# Patient Record
Sex: Female | Born: 1940 | Race: White | Hispanic: No | Marital: Married | Smoking: Never smoker
Health system: Southern US, Community
[De-identification: ages and names within clinical notes are randomized; demographics above are authoritative.]

## PROBLEM LIST (undated history)

## (undated) DIAGNOSIS — M069 Rheumatoid arthritis, unspecified: Secondary | ICD-10-CM

## (undated) DIAGNOSIS — I252 Old myocardial infarction: Secondary | ICD-10-CM

## (undated) DIAGNOSIS — M51379 Other intervertebral disc degeneration, lumbosacral region without mention of lumbar back pain or lower extremity pain: Secondary | ICD-10-CM

## (undated) DIAGNOSIS — IMO0001 Reserved for inherently not codable concepts without codable children: Secondary | ICD-10-CM

## (undated) DIAGNOSIS — I639 Cerebral infarction, unspecified: Secondary | ICD-10-CM

## (undated) DIAGNOSIS — R05 Cough: Secondary | ICD-10-CM

## (undated) DIAGNOSIS — K589 Irritable bowel syndrome without diarrhea: Secondary | ICD-10-CM

## (undated) DIAGNOSIS — M1990 Unspecified osteoarthritis, unspecified site: Secondary | ICD-10-CM

## (undated) DIAGNOSIS — I10 Essential (primary) hypertension: Secondary | ICD-10-CM

## (undated) DIAGNOSIS — R42 Dizziness and giddiness: Secondary | ICD-10-CM

## (undated) DIAGNOSIS — M5137 Other intervertebral disc degeneration, lumbosacral region: Secondary | ICD-10-CM

## (undated) DIAGNOSIS — G4733 Obstructive sleep apnea (adult) (pediatric): Secondary | ICD-10-CM

## (undated) DIAGNOSIS — Z8619 Personal history of other infectious and parasitic diseases: Secondary | ICD-10-CM

## (undated) DIAGNOSIS — M797 Fibromyalgia: Secondary | ICD-10-CM

## (undated) DIAGNOSIS — L719 Rosacea, unspecified: Secondary | ICD-10-CM

## (undated) DIAGNOSIS — I2510 Atherosclerotic heart disease of native coronary artery without angina pectoris: Secondary | ICD-10-CM

## (undated) DIAGNOSIS — T4145XA Adverse effect of unspecified anesthetic, initial encounter: Secondary | ICD-10-CM

## (undated) DIAGNOSIS — N390 Urinary tract infection, site not specified: Secondary | ICD-10-CM

## (undated) DIAGNOSIS — L4050 Arthropathic psoriasis, unspecified: Secondary | ICD-10-CM

## (undated) DIAGNOSIS — M2540 Effusion, unspecified joint: Secondary | ICD-10-CM

## (undated) DIAGNOSIS — R059 Cough, unspecified: Secondary | ICD-10-CM

## (undated) DIAGNOSIS — I608 Other nontraumatic subarachnoid hemorrhage: Secondary | ICD-10-CM

## (undated) DIAGNOSIS — T7840XA Allergy, unspecified, initial encounter: Secondary | ICD-10-CM

## (undated) DIAGNOSIS — M2550 Pain in unspecified joint: Secondary | ICD-10-CM

## (undated) DIAGNOSIS — T8859XA Other complications of anesthesia, initial encounter: Secondary | ICD-10-CM

## (undated) DIAGNOSIS — I25111 Atherosclerotic heart disease of native coronary artery with angina pectoris with documented spasm: Secondary | ICD-10-CM

## (undated) DIAGNOSIS — J302 Other seasonal allergic rhinitis: Secondary | ICD-10-CM

## (undated) DIAGNOSIS — I82409 Acute embolism and thrombosis of unspecified deep veins of unspecified lower extremity: Secondary | ICD-10-CM

## (undated) HISTORY — DX: Rosacea, unspecified: L71.9

## (undated) HISTORY — DX: Acute embolism and thrombosis of unspecified deep veins of unspecified lower extremity: I82.409

## (undated) HISTORY — DX: Unspecified osteoarthritis, unspecified site: M19.90

## (undated) HISTORY — DX: Cerebral infarction, unspecified: I63.9

## (undated) HISTORY — DX: Old myocardial infarction: I25.2

## (undated) HISTORY — DX: Reserved for inherently not codable concepts without codable children: IMO0001

## (undated) HISTORY — DX: Allergy, unspecified, initial encounter: T78.40XA

## (undated) HISTORY — DX: Other intervertebral disc degeneration, lumbosacral region without mention of lumbar back pain or lower extremity pain: M51.379

## (undated) HISTORY — DX: Arthropathic psoriasis, unspecified: L40.50

## (undated) HISTORY — PX: CATARACT EXTRACTION: SUR2

## (undated) HISTORY — DX: Other intervertebral disc degeneration, lumbosacral region: M51.37

## (undated) HISTORY — DX: Atherosclerotic heart disease of native coronary artery without angina pectoris: I25.10

## (undated) HISTORY — DX: Obstructive sleep apnea (adult) (pediatric): G47.33

## (undated) HISTORY — DX: Essential (primary) hypertension: I10

## (undated) HISTORY — PX: COLONOSCOPY: SHX174

## (undated) HISTORY — DX: Other nontraumatic subarachnoid hemorrhage: I60.8

## (undated) HISTORY — DX: Atherosclerotic heart disease of native coronary artery with angina pectoris with documented spasm: I25.111

## (undated) HISTORY — PX: OOPHORECTOMY: SHX86

---

## 1990-08-14 HISTORY — PX: VAGINAL HYSTERECTOMY: SUR661

## 1994-08-14 DIAGNOSIS — I252 Old myocardial infarction: Secondary | ICD-10-CM

## 1994-08-14 HISTORY — PX: ATHERECTOMY: SHX47

## 1994-08-14 HISTORY — DX: Old myocardial infarction: I25.2

## 1994-08-14 HISTORY — PX: CARDIAC CATHETERIZATION: SHX172

## 1996-08-14 HISTORY — PX: ETHMOIDECTOMY: SHX5197

## 1996-08-14 HISTORY — PX: NASAL SINUS SURGERY: SHX719

## 1996-08-14 HISTORY — PX: OTHER SURGICAL HISTORY: SHX169

## 1999-09-23 ENCOUNTER — Other Ambulatory Visit: Admission: RE | Admit: 1999-09-23 | Discharge: 1999-09-23 | Payer: Self-pay | Admitting: Obstetrics and Gynecology

## 2000-04-05 ENCOUNTER — Encounter: Payer: Self-pay | Admitting: Gastroenterology

## 2000-04-05 ENCOUNTER — Encounter: Admission: RE | Admit: 2000-04-05 | Discharge: 2000-04-05 | Payer: Self-pay | Admitting: Gastroenterology

## 2000-05-08 ENCOUNTER — Encounter: Admission: RE | Admit: 2000-05-08 | Discharge: 2000-05-08 | Payer: Self-pay | Admitting: Internal Medicine

## 2000-05-11 ENCOUNTER — Encounter: Payer: Self-pay | Admitting: Otolaryngology

## 2000-05-11 ENCOUNTER — Encounter: Admission: RE | Admit: 2000-05-11 | Discharge: 2000-05-11 | Payer: Self-pay | Admitting: Otolaryngology

## 2000-05-14 ENCOUNTER — Encounter: Admission: RE | Admit: 2000-05-14 | Discharge: 2000-05-14 | Payer: Self-pay | Admitting: Internal Medicine

## 2000-05-14 ENCOUNTER — Encounter: Payer: Self-pay | Admitting: Internal Medicine

## 2000-05-17 ENCOUNTER — Encounter: Payer: Self-pay | Admitting: Otolaryngology

## 2000-05-17 ENCOUNTER — Encounter: Admission: RE | Admit: 2000-05-17 | Discharge: 2000-05-17 | Payer: Self-pay | Admitting: Otolaryngology

## 2000-05-23 ENCOUNTER — Encounter (INDEPENDENT_AMBULATORY_CARE_PROVIDER_SITE_OTHER): Payer: Self-pay | Admitting: *Deleted

## 2000-05-23 ENCOUNTER — Ambulatory Visit (HOSPITAL_BASED_OUTPATIENT_CLINIC_OR_DEPARTMENT_OTHER): Admission: RE | Admit: 2000-05-23 | Discharge: 2000-05-23 | Payer: Self-pay | Admitting: Otolaryngology

## 2000-09-27 ENCOUNTER — Other Ambulatory Visit: Admission: RE | Admit: 2000-09-27 | Discharge: 2000-09-27 | Payer: Self-pay | Admitting: Obstetrics and Gynecology

## 2001-04-11 ENCOUNTER — Encounter: Payer: Self-pay | Admitting: Obstetrics and Gynecology

## 2001-04-11 ENCOUNTER — Encounter: Admission: RE | Admit: 2001-04-11 | Discharge: 2001-04-11 | Payer: Self-pay | Admitting: Obstetrics and Gynecology

## 2001-10-15 ENCOUNTER — Other Ambulatory Visit: Admission: RE | Admit: 2001-10-15 | Discharge: 2001-10-15 | Payer: Self-pay | Admitting: Obstetrics and Gynecology

## 2002-04-15 ENCOUNTER — Encounter: Admission: RE | Admit: 2002-04-15 | Discharge: 2002-04-15 | Payer: Self-pay | Admitting: Obstetrics and Gynecology

## 2002-04-15 ENCOUNTER — Encounter: Payer: Self-pay | Admitting: Obstetrics and Gynecology

## 2002-10-31 ENCOUNTER — Other Ambulatory Visit: Admission: RE | Admit: 2002-10-31 | Discharge: 2002-10-31 | Payer: Self-pay | Admitting: Obstetrics and Gynecology

## 2002-11-04 ENCOUNTER — Encounter: Admission: RE | Admit: 2002-11-04 | Discharge: 2002-11-04 | Payer: Self-pay | Admitting: Obstetrics and Gynecology

## 2002-11-04 ENCOUNTER — Encounter: Payer: Self-pay | Admitting: Obstetrics and Gynecology

## 2003-11-02 ENCOUNTER — Other Ambulatory Visit: Admission: RE | Admit: 2003-11-02 | Discharge: 2003-11-02 | Payer: Self-pay | Admitting: Obstetrics and Gynecology

## 2003-11-18 ENCOUNTER — Encounter: Admission: RE | Admit: 2003-11-18 | Discharge: 2003-11-18 | Payer: Self-pay | Admitting: Obstetrics and Gynecology

## 2004-03-23 ENCOUNTER — Encounter: Admission: RE | Admit: 2004-03-23 | Discharge: 2004-03-23 | Payer: Self-pay | Admitting: Gastroenterology

## 2004-03-28 ENCOUNTER — Encounter: Admission: RE | Admit: 2004-03-28 | Discharge: 2004-03-28 | Payer: Self-pay | Admitting: Gastroenterology

## 2004-11-02 ENCOUNTER — Other Ambulatory Visit: Admission: RE | Admit: 2004-11-02 | Discharge: 2004-11-02 | Payer: Self-pay | Admitting: Obstetrics and Gynecology

## 2004-11-25 ENCOUNTER — Encounter: Admission: RE | Admit: 2004-11-25 | Discharge: 2004-11-25 | Payer: Self-pay | Admitting: Obstetrics and Gynecology

## 2005-05-24 ENCOUNTER — Encounter: Admission: RE | Admit: 2005-05-24 | Discharge: 2005-05-24 | Payer: Self-pay | Admitting: Allergy and Immunology

## 2005-06-14 ENCOUNTER — Encounter: Admission: RE | Admit: 2005-06-14 | Discharge: 2005-06-14 | Payer: Self-pay | Admitting: Gastroenterology

## 2005-11-16 ENCOUNTER — Observation Stay (HOSPITAL_COMMUNITY): Admission: EM | Admit: 2005-11-16 | Discharge: 2005-11-16 | Payer: Self-pay | Admitting: *Deleted

## 2005-11-27 ENCOUNTER — Encounter: Admission: RE | Admit: 2005-11-27 | Discharge: 2005-11-27 | Payer: Self-pay | Admitting: Obstetrics and Gynecology

## 2006-02-16 ENCOUNTER — Encounter: Admission: RE | Admit: 2006-02-16 | Discharge: 2006-02-16 | Payer: Self-pay | Admitting: Obstetrics and Gynecology

## 2006-02-27 ENCOUNTER — Other Ambulatory Visit: Admission: RE | Admit: 2006-02-27 | Discharge: 2006-02-27 | Payer: Self-pay | Admitting: Obstetrics and Gynecology

## 2006-05-14 ENCOUNTER — Encounter: Admission: RE | Admit: 2006-05-14 | Discharge: 2006-05-14 | Payer: Self-pay | Admitting: Gastroenterology

## 2006-05-14 ENCOUNTER — Encounter: Payer: Self-pay | Admitting: Family Medicine

## 2006-11-29 ENCOUNTER — Encounter: Admission: RE | Admit: 2006-11-29 | Discharge: 2006-11-29 | Payer: Self-pay | Admitting: Obstetrics and Gynecology

## 2007-04-25 ENCOUNTER — Emergency Department (HOSPITAL_COMMUNITY): Admission: EM | Admit: 2007-04-25 | Discharge: 2007-04-25 | Payer: Self-pay | Admitting: Emergency Medicine

## 2007-05-20 ENCOUNTER — Ambulatory Visit: Payer: Self-pay | Admitting: Family Medicine

## 2007-05-25 ENCOUNTER — Encounter: Payer: Self-pay | Admitting: Family Medicine

## 2007-05-29 ENCOUNTER — Ambulatory Visit: Payer: Self-pay | Admitting: Family Medicine

## 2007-05-29 DIAGNOSIS — L719 Rosacea, unspecified: Secondary | ICD-10-CM | POA: Insufficient documentation

## 2007-05-29 DIAGNOSIS — E785 Hyperlipidemia, unspecified: Secondary | ICD-10-CM

## 2007-05-29 DIAGNOSIS — M1990 Unspecified osteoarthritis, unspecified site: Secondary | ICD-10-CM | POA: Insufficient documentation

## 2007-05-29 DIAGNOSIS — I10 Essential (primary) hypertension: Secondary | ICD-10-CM | POA: Insufficient documentation

## 2007-05-29 DIAGNOSIS — J309 Allergic rhinitis, unspecified: Secondary | ICD-10-CM | POA: Insufficient documentation

## 2007-05-29 DIAGNOSIS — G4733 Obstructive sleep apnea (adult) (pediatric): Secondary | ICD-10-CM

## 2007-06-03 ENCOUNTER — Encounter: Payer: Self-pay | Admitting: Family Medicine

## 2007-06-06 ENCOUNTER — Encounter: Payer: Self-pay | Admitting: Family Medicine

## 2007-06-11 ENCOUNTER — Encounter: Payer: Self-pay | Admitting: Family Medicine

## 2007-07-02 ENCOUNTER — Encounter: Payer: Self-pay | Admitting: Family Medicine

## 2007-08-05 ENCOUNTER — Ambulatory Visit: Payer: Self-pay | Admitting: Family Medicine

## 2007-08-14 ENCOUNTER — Telehealth: Payer: Self-pay | Admitting: Internal Medicine

## 2007-08-14 ENCOUNTER — Telehealth (INDEPENDENT_AMBULATORY_CARE_PROVIDER_SITE_OTHER): Payer: Self-pay | Admitting: *Deleted

## 2008-02-12 LAB — CONVERTED CEMR LAB: Pap Smear: NORMAL

## 2008-02-20 ENCOUNTER — Encounter: Payer: Self-pay | Admitting: Family Medicine

## 2008-02-20 ENCOUNTER — Encounter: Admission: RE | Admit: 2008-02-20 | Discharge: 2008-02-20 | Payer: Self-pay | Admitting: Obstetrics and Gynecology

## 2008-03-03 ENCOUNTER — Other Ambulatory Visit: Admission: RE | Admit: 2008-03-03 | Discharge: 2008-03-03 | Payer: Self-pay | Admitting: Obstetrics and Gynecology

## 2008-05-20 ENCOUNTER — Ambulatory Visit: Payer: Self-pay | Admitting: Family Medicine

## 2008-05-20 DIAGNOSIS — M797 Fibromyalgia: Secondary | ICD-10-CM

## 2008-05-21 ENCOUNTER — Ambulatory Visit: Payer: Self-pay | Admitting: Family Medicine

## 2008-05-22 LAB — CONVERTED CEMR LAB
Albumin: 4 g/dL (ref 3.5–5.2)
Alkaline Phosphatase: 52 units/L (ref 39–117)
Anti Nuclear Antibody(ANA): NEGATIVE
Basophils Absolute: 0.1 10*3/uL (ref 0.0–0.1)
Basophils Relative: 1.1 % (ref 0.0–3.0)
Bilirubin, Direct: 0.1 mg/dL (ref 0.0–0.3)
CRP, High Sensitivity: <1 — ABNORMAL LOW (ref 0.00–5.00)
Calcium: 8.8 mg/dL (ref 8.4–10.5)
Chloride: 103 meq/L (ref 96–112)
Creatinine, Ser: 0.9 mg/dL (ref 0.4–1.2)
Cyclic Citrullin Peptide Ab: 0.4 units (ref <7)
Eosinophils Absolute: 0.1 10*3/uL (ref 0.0–0.7)
Eosinophils Relative: 2.8 % (ref 0.0–5.0)
GFR calc Af Amer: 80 mL/min
HCT: 40.6 % (ref 36.0–46.0)
Hemoglobin: 13.6 g/dL (ref 12.0–15.0)
Lymphocytes Relative: 21.5 % (ref 12.0–46.0)
Monocytes Absolute: 0.5 10*3/uL (ref 0.1–1.0)
Neutro Abs: 3.1 10*3/uL (ref 1.4–7.7)
Neutrophils Relative %: 65.1 % (ref 43.0–77.0)
Platelets: 231 10*3/uL (ref 150–400)
RBC: 4.13 M/uL (ref 3.87–5.11)
Vitamin B-12: 850 pg/mL (ref 211–911)

## 2008-05-28 ENCOUNTER — Encounter: Admission: RE | Admit: 2008-05-28 | Discharge: 2008-05-28 | Payer: Self-pay | Admitting: Family Medicine

## 2008-07-22 ENCOUNTER — Encounter: Payer: Self-pay | Admitting: Family Medicine

## 2008-08-20 ENCOUNTER — Encounter: Payer: Self-pay | Admitting: Family Medicine

## 2008-08-25 ENCOUNTER — Ambulatory Visit: Payer: Self-pay | Admitting: Internal Medicine

## 2008-08-31 ENCOUNTER — Encounter: Payer: Self-pay | Admitting: Family Medicine

## 2008-09-10 ENCOUNTER — Encounter: Payer: Self-pay | Admitting: Family Medicine

## 2008-12-29 ENCOUNTER — Encounter: Payer: Self-pay | Admitting: Family Medicine

## 2008-12-29 ENCOUNTER — Ambulatory Visit: Payer: Self-pay | Admitting: Cardiology

## 2008-12-29 ENCOUNTER — Ambulatory Visit: Payer: Self-pay | Admitting: Ophthalmology

## 2009-01-13 ENCOUNTER — Encounter: Payer: Self-pay | Admitting: Family Medicine

## 2009-01-15 ENCOUNTER — Encounter: Admission: RE | Admit: 2009-01-15 | Discharge: 2009-01-15 | Payer: Self-pay | Admitting: Family Medicine

## 2009-01-15 ENCOUNTER — Ambulatory Visit: Payer: Self-pay | Admitting: Family Medicine

## 2009-01-15 DIAGNOSIS — M51379 Other intervertebral disc degeneration, lumbosacral region without mention of lumbar back pain or lower extremity pain: Secondary | ICD-10-CM | POA: Insufficient documentation

## 2009-01-15 DIAGNOSIS — M5137 Other intervertebral disc degeneration, lumbosacral region: Secondary | ICD-10-CM | POA: Insufficient documentation

## 2009-01-18 ENCOUNTER — Telehealth (INDEPENDENT_AMBULATORY_CARE_PROVIDER_SITE_OTHER): Payer: Self-pay | Admitting: Internal Medicine

## 2009-01-25 ENCOUNTER — Telehealth: Payer: Self-pay | Admitting: Family Medicine

## 2009-01-25 ENCOUNTER — Emergency Department (HOSPITAL_COMMUNITY): Admission: EM | Admit: 2009-01-25 | Discharge: 2009-01-25 | Payer: Self-pay | Admitting: Emergency Medicine

## 2009-01-26 ENCOUNTER — Ambulatory Visit (HOSPITAL_COMMUNITY): Admission: RE | Admit: 2009-01-26 | Discharge: 2009-01-26 | Payer: Self-pay | Admitting: Emergency Medicine

## 2009-01-26 ENCOUNTER — Encounter (INDEPENDENT_AMBULATORY_CARE_PROVIDER_SITE_OTHER): Payer: Self-pay | Admitting: Internal Medicine

## 2009-01-27 ENCOUNTER — Telehealth: Payer: Self-pay | Admitting: Family Medicine

## 2009-02-02 ENCOUNTER — Encounter: Payer: Self-pay | Admitting: Family Medicine

## 2009-02-04 ENCOUNTER — Ambulatory Visit: Payer: Self-pay | Admitting: Family Medicine

## 2009-02-11 ENCOUNTER — Encounter: Payer: Self-pay | Admitting: Family Medicine

## 2009-02-16 ENCOUNTER — Encounter (INDEPENDENT_AMBULATORY_CARE_PROVIDER_SITE_OTHER): Payer: Self-pay | Admitting: Internal Medicine

## 2009-02-16 ENCOUNTER — Telehealth (INDEPENDENT_AMBULATORY_CARE_PROVIDER_SITE_OTHER): Payer: Self-pay | Admitting: Internal Medicine

## 2009-02-25 ENCOUNTER — Encounter: Payer: Self-pay | Admitting: Family Medicine

## 2009-02-25 ENCOUNTER — Encounter: Admission: RE | Admit: 2009-02-25 | Discharge: 2009-02-25 | Payer: Self-pay | Admitting: Obstetrics and Gynecology

## 2009-02-26 ENCOUNTER — Encounter (INDEPENDENT_AMBULATORY_CARE_PROVIDER_SITE_OTHER): Payer: Self-pay | Admitting: *Deleted

## 2009-03-08 ENCOUNTER — Ambulatory Visit: Payer: Self-pay | Admitting: Obstetrics and Gynecology

## 2009-03-11 ENCOUNTER — Encounter: Payer: Self-pay | Admitting: Family Medicine

## 2009-03-18 ENCOUNTER — Ambulatory Visit: Payer: Self-pay | Admitting: Internal Medicine

## 2009-03-22 ENCOUNTER — Encounter: Payer: Self-pay | Admitting: Family Medicine

## 2009-03-22 LAB — CBC WITH DIFFERENTIAL/PLATELET
Basophils Absolute: 0.1 10*3/uL (ref 0.0–0.1)
HCT: 36.1 % (ref 34.8–46.6)
HGB: 12.1 g/dL (ref 11.6–15.9)
MONO#: 0.6 10*3/uL (ref 0.1–0.9)
NEUT%: 68.4 % (ref 38.4–76.8)
WBC: 7.5 10*3/uL (ref 3.9–10.3)
lymph#: 1.6 10*3/uL (ref 0.9–3.3)

## 2009-03-24 ENCOUNTER — Ambulatory Visit: Payer: Self-pay | Admitting: Obstetrics and Gynecology

## 2009-03-25 LAB — IGG, IGA, IGM
IgA: 308 mg/dL (ref 68–378)
IgM, Serum: 70 mg/dL (ref 60–263)

## 2009-03-25 LAB — COMPREHENSIVE METABOLIC PANEL
BUN: 19 mg/dL (ref 6–23)
CO2: 27 mEq/L (ref 19–32)
Calcium: 9.4 mg/dL (ref 8.4–10.5)
Chloride: 105 mEq/L (ref 96–112)
Creatinine, Ser: 0.73 mg/dL (ref 0.40–1.20)
Glucose, Bld: 94 mg/dL (ref 70–99)

## 2009-03-25 LAB — LACTATE DEHYDROGENASE: LDH: 175 U/L (ref 94–250)

## 2009-04-01 ENCOUNTER — Encounter (INDEPENDENT_AMBULATORY_CARE_PROVIDER_SITE_OTHER): Payer: Self-pay | Admitting: *Deleted

## 2009-04-01 ENCOUNTER — Encounter: Payer: Self-pay | Admitting: Family Medicine

## 2009-04-01 LAB — CONVERTED CEMR LAB
BUN: 19 mg/dL
CO2: 27 meq/L
Glucose, Bld: 94 mg/dL
Hemoglobin: 12.1 g/dL
LDH: 175 units/L
MCV: 97.5 fL
Sodium: 141 meq/L
Total Bilirubin: 0.3 mg/dL
Total Protein: 6.7 g/dL
WBC: 7.5 10*3/uL

## 2009-04-22 ENCOUNTER — Encounter: Payer: Self-pay | Admitting: Family Medicine

## 2009-05-10 ENCOUNTER — Ambulatory Visit: Payer: Self-pay | Admitting: Ophthalmology

## 2009-06-29 ENCOUNTER — Ambulatory Visit: Payer: Self-pay | Admitting: Family Medicine

## 2009-07-02 ENCOUNTER — Ambulatory Visit: Payer: Self-pay | Admitting: Family Medicine

## 2009-07-07 LAB — CONVERTED CEMR LAB: VLDL: 21.8 mg/dL (ref 0.0–40.0)

## 2009-07-12 ENCOUNTER — Encounter (INDEPENDENT_AMBULATORY_CARE_PROVIDER_SITE_OTHER): Payer: Self-pay | Admitting: *Deleted

## 2009-08-04 ENCOUNTER — Encounter: Payer: Self-pay | Admitting: Family Medicine

## 2009-09-28 ENCOUNTER — Ambulatory Visit: Payer: Self-pay | Admitting: Ophthalmology

## 2009-10-13 ENCOUNTER — Encounter: Payer: Self-pay | Admitting: Family Medicine

## 2010-02-10 ENCOUNTER — Ambulatory Visit: Payer: Self-pay | Admitting: Family Medicine

## 2010-02-11 LAB — CONVERTED CEMR LAB
Pap Smear: NORMAL
Pap Smear: NORMAL
Pap Smear: NORMAL
Pap Smear: NORMAL

## 2010-02-11 LAB — HM PAP SMEAR

## 2010-03-09 ENCOUNTER — Ambulatory Visit: Payer: Self-pay | Admitting: Obstetrics and Gynecology

## 2010-03-09 ENCOUNTER — Other Ambulatory Visit: Admission: RE | Admit: 2010-03-09 | Discharge: 2010-03-09 | Payer: Self-pay | Admitting: Obstetrics and Gynecology

## 2010-03-14 LAB — HM MAMMOGRAPHY: HM Mammogram: NORMAL

## 2010-03-17 ENCOUNTER — Encounter: Payer: Self-pay | Admitting: Internal Medicine

## 2010-03-21 ENCOUNTER — Ambulatory Visit: Payer: Self-pay | Admitting: Obstetrics and Gynecology

## 2010-03-23 ENCOUNTER — Ambulatory Visit: Payer: Self-pay | Admitting: Obstetrics and Gynecology

## 2010-04-06 ENCOUNTER — Ambulatory Visit: Payer: Self-pay | Admitting: Internal Medicine

## 2010-04-06 ENCOUNTER — Ambulatory Visit (HOSPITAL_COMMUNITY): Admission: RE | Admit: 2010-04-06 | Discharge: 2010-04-06 | Payer: Self-pay | Admitting: Internal Medicine

## 2010-05-10 ENCOUNTER — Telehealth: Payer: Self-pay | Admitting: Family Medicine

## 2010-06-02 ENCOUNTER — Ambulatory Visit: Payer: Self-pay | Admitting: Obstetrics and Gynecology

## 2010-06-27 ENCOUNTER — Telehealth (INDEPENDENT_AMBULATORY_CARE_PROVIDER_SITE_OTHER): Payer: Self-pay | Admitting: *Deleted

## 2010-06-28 ENCOUNTER — Ambulatory Visit: Payer: Self-pay | Admitting: Family Medicine

## 2010-06-28 LAB — CONVERTED CEMR LAB
Alkaline Phosphatase: 64 units/L (ref 39–117)
Bilirubin, Direct: 0.1 mg/dL (ref 0.0–0.3)
Calcium: 9.4 mg/dL (ref 8.4–10.5)
GFR calc non Af Amer: 59.67 mL/min (ref >60)
HDL: 77.1 mg/dL (ref >39.00)
Sodium: 139 meq/L (ref 135–145)
Total CHOL/HDL Ratio: 3
Triglycerides: 82 mg/dL (ref 0.0–149.0)

## 2010-07-15 ENCOUNTER — Ambulatory Visit: Payer: Self-pay | Admitting: Family Medicine

## 2010-07-15 DIAGNOSIS — R21 Rash and other nonspecific skin eruption: Secondary | ICD-10-CM

## 2010-07-15 DIAGNOSIS — R1011 Right upper quadrant pain: Secondary | ICD-10-CM | POA: Insufficient documentation

## 2010-07-15 LAB — CONVERTED CEMR LAB
HDL goal, serum: 40 mg/dL
LDL Goal: 160 mg/dL

## 2010-07-19 ENCOUNTER — Ambulatory Visit: Payer: Self-pay | Admitting: Family Medicine

## 2010-07-19 ENCOUNTER — Encounter: Payer: Self-pay | Admitting: Family Medicine

## 2010-07-20 ENCOUNTER — Encounter: Payer: Self-pay | Admitting: Family Medicine

## 2010-07-22 ENCOUNTER — Encounter: Payer: Self-pay | Admitting: Family Medicine

## 2010-07-30 ENCOUNTER — Encounter: Payer: Self-pay | Admitting: Cardiovascular Disease

## 2010-08-01 ENCOUNTER — Ambulatory Visit: Payer: Self-pay | Admitting: Family Medicine

## 2010-08-01 DIAGNOSIS — I252 Old myocardial infarction: Secondary | ICD-10-CM | POA: Insufficient documentation

## 2010-08-05 ENCOUNTER — Encounter: Payer: Self-pay | Admitting: Cardiovascular Disease

## 2010-08-05 ENCOUNTER — Ambulatory Visit: Payer: Self-pay | Admitting: Cardiovascular Disease

## 2010-08-16 ENCOUNTER — Telehealth: Payer: Self-pay | Admitting: Cardiovascular Disease

## 2010-08-23 ENCOUNTER — Telehealth: Payer: Self-pay | Admitting: Cardiovascular Disease

## 2010-09-04 ENCOUNTER — Encounter: Payer: Self-pay | Admitting: Obstetrics and Gynecology

## 2010-09-07 ENCOUNTER — Telehealth: Payer: Self-pay | Admitting: Cardiovascular Disease

## 2010-09-13 NOTE — Progress Notes (Signed)
Summary: ? Colonoscopy report  Phone Note Call from Patient Call back at [phone] or [phone]   Caller: Patient Call For: Kerby Nora MD Summary of Call: Patient called to make sure that you got the final colonoscopy report from [facility]. Patient states that they were suppose to send her a copy of the results and she never got it. Patient states that she would like to get a copy from you when she comes in for her next visit in December. Initial call taken by: Sydell Axon LPN,  May 10, 2010 11:12 AM  Follow-up for Phone Call        You can go ahead and mail her copy...is in as 03/2010 op note. Follow-up by: Kerby Nora MD,  May 10, 2010 11:27 AM  Additional Follow-up for Phone Call Additional follow up Details #1::        report mailed to patient.Consuello Masse CMA   Additional Follow-up by: Benny Lennert CMA Duncan Dull),  May 10, 2010 12:00 PM

## 2010-09-13 NOTE — Assessment & Plan Note (Signed)
Summary: 11:15 CHECK PLACE ON LEFT LEG/CLE   Vital Signs:  Patient profile:   69 year old female Height:      65.5 inches Weight:      145.8 pounds BMI:     23.98 Temp:     97.8 degrees F oral Pulse rate:   76 / minute Pulse rhythm:   regular BP sitting:   120 / 78  (left arm) Cuff size:   regular  Vitals Entered By: Benny Lennert CMA Duncan Dull) (February 10, 2010 10:59 AM)  History of Present Illness: Chief complaint check place on left leg  70 year old female:  left leg  the patient was in the gym 2 days ago, and  abraded her shin  with some equipment.  This appears to have an abrasion now, it is mildly warm. Her daughter was concerned this could be cellulitic.  No fever, chills, sweats. No nausea or vomiting.  GEN: Well-developed,well-nourished,in no acute distress; alert,appropriate and cooperative throughout examination HEENT: Normocephalic and atraumatic without obvious abnormalities. No apparent alopecia or balding. Ears, externally no deformities PULM: Breathing comfortably in no respiratory distress EXT: No clubbing, cyanosis, or edema PSYCH: Normally interactive. Cooperative during the interview. Pleasant. Friendly and conversant. Not anxious or depressed appearing. Normal, full affect.   Skin: Mild minimal abrasion, minimally warm on the left anterior tibia without  fluctuance  Allergies: 1)  ! Penicillin 2)  ! Ace Inhibitors 3)  ! * Latex   Impression & Recommendations:  Problem # 1:  ABRASION, LEG (ICD-916.0) doi 02/08/2010  reassurance, this point I don't think it is cellulitic. It is minimally warm, and given it is a long weekend, have given her a course of Keflex to hold in case this gets worse. 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  Complete Medication List: 1)  Dilacor Xr 240 Mg Cp24 (Diltiazem hcl) .[name].. 1 by mouth once daily 2)  Adult Aspirin Ec Low Strength 81 Mg Tbec (Aspirin) .... Once daily 3)  Fish Oil Oil (Fish  oil) .[name].. 1000 mg. two tablets by mouth two times a day on hold due to possible cataract surgery 4)  Caltrate 600 1500 Mg Tabs (Calcium carbonate) .[name].. 1 by mouth two times a day 5)  Multivitamins Tabs (Multiple vitamin) .... Once daily 6)  Tylenol 325 Mg Tabs (Acetaminophen) .... Or motrin as needed 7)  Neomycin-polymyxin-dexameth 0.5-10000-0.1 Oint (Neomycin-polymyxin-dexameth) .... As needed 8)  Metronidazole 0.75 % Ext Gel (Metronidazole) .... As needed rosacea 9)  Estradiol 0.05 Mg/24hr Ptwk (Estradiol) 10)  Nitrostat 0.4 Mg Subl (Nitroglycerin) .... As needed 11)  Systane 0.4-0.3 % Soln (Polyethyl glycol-propyl glycol) .... Eye drop s as directed 12)  Cq 10 200mg   .... With meals daily 13)  Vitamin B-1 250 Mg Tabs (Thiamine hcl) .... One at dinner 14)  Ambien 5 Mg Tabs (Zolpidem tartrate) .[name].. 1 by mouth at bedtime as needed sleep 15)  Cephalexin 500 Mg Tabs (Cephalexin) .... Take one by mouth 4 times daily (no reaction to cephalosporins) Prescriptions: CEPHALEXIN 500 MG  TABS (CEPHALEXIN) take one by mouth 4 times daily (no reaction to cephalosporins)  #40 x 0   Entered and Authorized by:   Hannah Beat MD   Signed by:   Hannah Beat MD on 02/10/2010   Method used:   Print then Give to Patient   RxID:   [id number]   Current Allergies (reviewed today): ! PENICILLIN ! ACE INHIBITORS ! * LATEX

## 2010-09-13 NOTE — Letter (Signed)
Summary: [facility]  [facility]   Imported By: Cloria Spring LPN 16/05/9603 54:09:81  _____________________________________________________________________  External Attachment:    Type:   Image     Comment:   External Document

## 2010-09-13 NOTE — Letter (Signed)
Summary: [facility]  [facility]   Imported By: Lanelle Bal 10/20/2009 12:43:08  _____________________________________________________________________  External Attachment:    Type:   Image     Comment:   External Document

## 2010-09-13 NOTE — Progress Notes (Signed)
----   Converted from flag ---- ---- 06/27/2010 9:28 AM, Kerby Nora MD wrote: CMET, lipids Dx v77.91  ---- 06/23/2010 1:52 PM, Liane Comber CMA (AAMA) wrote: Lab orders please! Good Morning! This pt is scheduled for cpx labs Tuesday, which labs to draw and dx codes to use? Thanks Tasha ------------------------------

## 2010-09-15 NOTE — Consult Note (Signed)
Summary: [facility]   Imported By: Lanelle Bal 07/30/2010 10:08:35  _____________________________________________________________________  External Attachment:    Type:   Image     Comment:   External Document

## 2010-09-15 NOTE — Assessment & Plan Note (Signed)
Summary: CPX/CLE   Vital Signs:  Patient profile:   70 year old female Height:      65.5 inches Weight:      148.12 pounds BMI:     24.36 Temp:     97.9 degrees F oral Pulse rate:   76 / minute Pulse rhythm:   regular BP sitting:   130 / 70  (left arm) Cuff size:   regular  Vitals Entered By: Benny Lennert CMA Duncan Dull) (July 15, 2010 11:09 AM)  History of Present Illness: Chief complaint medication management  Fibromyalgia: Moderate control with acupuncture, supplements, exercise.  Sees GYN.. Dr. Dortha Schwalbe... yeast infection vaginally in July ... given  2 doses of diflucan.   Groin and rectal redness, itching in last 6 months.  No change with diflucan.  Hydrocortisone cream has helped.     Umbilicus red, itchy and odor.. Cleaning with peroxide.   No change in detergent or soap, no new foods, no new supplements.  Hypertension History:      well controlled on current meds( on diltiazem)... except higher afterdrinking caffeine.        Positive major cardiovascular risk factors include female age 56 years old or older, hyperlipidemia, and hypertension.  Negative major cardiovascular risk factors include non-tobacco-user status.    Lipid Management History:      Positive NCEP/ATP III risk factors include female age 34 years old or older and hypertension.  Negative NCEP/ATP III risk factors include HDL cholesterol greater than 60 and non-tobacco-user status.        Her compliance with the TLC diet is excellent.  The patient expresses understanding of adjunctive measures for cholesterol lowering.  Adjunctive measures started by the patient include aerobic exercise, fiber, omega-3 supplements, limit alcohol consumpton, and weight reduction.  Comments: Exercising walking/biking 4 times a week. .    Problems Prior to Update: 1)  Fatigue  (ICD-780.79) 2)  Abrasion, Leg  (ICD-916.0) 3)  Degenerative Disc Disease, Lumbar Spine  (ICD-722.52) 4)  Fibromyalgia  (ICD-729.1) 5)   Osteoarthritis  (ICD-715.90) 6)  Hyperlipidemia  (ICD-272.4) 7)  Obstructive Sleep Apnea  (ICD-327.23) 8)  Allergic Rhinitis  (ICD-477.9) 9)  Acne, Rosacea  (ICD-695.3) 10)  Hypertension  (ICD-401.9)  Current Medications (verified): 1)  Dilacor Xr 240 Mg  Cp24 (Diltiazem Hcl) .[name].. 1 By Mouth Once Daily 2)  Adult Aspirin Ec Low Strength 81 Mg  Tbec (Aspirin) .... Once Daily 3)  Fish Oil   Oil (Fish Oil) .[name].. 1000 Mg. Two Tablets By Mouth Two Times A Day On Hold Due To Possible Cataract Surgery 4)  Caltrate 600 1500 Mg  Tabs (Calcium Carbonate) .[name].. 1 By Mouth Two Times A Day 5)  Multivitamins   Tabs (Multiple Vitamin) .... Once Daily 6)  Tylenol 325 Mg  Tabs (Acetaminophen) .... or Motrin As Needed 7)  Neomycin-Polymyxin-Dexameth 0.5-10000-0.1  Oint (Neomycin-Polymyxin-Dexameth) .... As Needed 8)  Metronidazole 0.75 % Ext Gel (Metronidazole) .... As Needed Rosacea 9)  Estradiol 0.05 Mg/24hr Ptwk (Estradiol) 10)  Nitrostat 0.4 Mg Subl (Nitroglycerin) .... As Needed 11)  Systane 0.4-0.3 % Soln (Polyethyl Glycol-Propyl Glycol) .... Eye Drop S As Directed 12)  Cq 10 200mg  .... With Meals Daily 13)  Vitamin B-1 250 Mg  Tabs (Thiamine Hcl) .... One At Dinner 14)  Unisom 25 Mg Tabs (Doxylamine Succinate (Sleep)) .... 1/2 Tablet Daily  Allergies: 1)  ! Penicillin 2)  ! Ace Inhibitors 3)  ! * Latex  Past History:  Past medical, surgical, family and  social histories (including risk factors) reviewed, and no changes noted (except as noted below).  Past Medical History: Reviewed history from 05/29/2007 and no changes required. Hypertension Allergic rhinitis Hyperlipidemia Osteoarthritis  Past Surgical History: Reviewed history from 05/29/2007 and no changes required. 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 father died age 45 colon cancer mother died age 1 MI, [name], osteoporosis 2 sisters GI problems, GERD, HTN, osteopenia aunt breast cancer no MI < age 29  Social History: Reviewed history from 05/29/2007 and no changes required. Occupation: Charity fundraiser, [employer] Married Never Smoked Alcohol use-yes, rare Drug use-no Regular exercise-no, used to do Yoga Diet: [employer] Diet  Review of Systems GI:  Complains of abdominal pain; denies bloody stools and diarrhea; occ sharp pain in right abdomen last 3 min. no clear association with eating. ... feels like muscle spasm.  Rare nausea. HAs history of [name].. constipation and diarrhea.. [facility] colon health helps... sees GI MD..  Physical Exam  General:  elderly a[pearing female in NAD Eyes:  No corneal or conjunctival inflammation noted. EOMI. Perrla. Funduscopic exam benign, without hemorrhages, exudates or papilledema. Vision grossly normal. Ears:  External ear exam shows no significant lesions or deformities.  Otoscopic examination reveals clear canals, tympanic membranes are intact bilaterally without bulging, retraction, inflammation or discharge. Hearing is grossly normal bilaterally. Nose:  External nasal examination shows no deformity or inflammation. Nasal mucosa are pink and moist without lesions or exudates. Mouth:  MMM Neck:  no cervical or supraclavicular lymphadenopathy no carotid bruit or thyromegaly  Breasts:  per GYN Lungs:  Normal respiratory effort, chest expands symmetrically. Lungs are clear to auscultation, no crackles or wheezes. Heart:  Normal rate and regular rhythm. S1 and S2 normal without gallop, murmur, click, rub or other extra sounds. Abdomen:  ttp in RUQ, no rebound, no guarding, NABS Genitalia:  per GYN Pulses:  R and L posterior tibial pulses are full and equal bilaterally  Extremities:  no edema Skin:  erythema in innner thigh only at [employer], also at umbilicus Psych:   slightly anxious.     Impression & Recommendations:  Problem # 1:  FIBROMYALGIA (ICD-729.1) Assessment Unchanged  Her updated medication list for this problem includes:    Adult Aspirin Ec Low Strength 81 Mg Tbec (Aspirin) ..... Once daily    Tylenol 325 Mg Tabs (Acetaminophen) ..... Or motrin as needed  Orders: Rheumatology Referral (Rheumatology)  Problem # 2:  SKIN RASH (ICD-782.1) Assessment: New Most consistent with allergic contact derm. Treat with topical steroid. Call if not improving in 2 weeks.  Her updated medication list for this problem includes:    Triamcinolone Acetonide 0.5 % Crea (Triamcinolone acetonide) .[name]... Aaa twice a day for 2 weeks  Problem # 3:  RUQ PAIN (ICD-789.01) Assessment: New Reviewed labs..within normal limits.  Eval for gallbladder disease with [place].  Orders: Radiology Referral (Radiology)  Complete Medication List: 1)  Dilacor Xr 240 Mg Cp24 (Diltiazem hcl) .[name].. 1 by mouth once daily 2)  Adult Aspirin Ec Low Strength 81 Mg Tbec (Aspirin) .... Once daily 3)  Fish Oil Oil (Fish oil) .[name].. 1000 mg. two tablets by mouth two times a day on hold due to possible cataract surgery 4)  Caltrate 600 1500 Mg Tabs (Calcium carbonate) .[name].. 1 by mouth two times a day 5)  Multivitamins Tabs (Multiple  vitamin) .... Once daily 6)  Tylenol 325 Mg Tabs (Acetaminophen) .... Or motrin as needed 7)  Neomycin-polymyxin-dexameth 0.5-10000-0.1 Oint (Neomycin-polymyxin-dexameth) .... As needed 8)  Metronidazole 0.75 % Ext Gel (Metronidazole) .... As needed rosacea 9)  Estradiol 0.05 Mg/24hr Ptwk (Estradiol) 10)  Nitrostat 0.4 Mg Subl (Nitroglycerin) .... As needed 11)  Systane 0.4-0.3 % Soln (Polyethyl glycol-propyl glycol) .... Eye drop s as directed 12)  Cq 10 200mg   .... With meals daily 13)  Vitamin B-1 250 Mg Tabs (Thiamine hcl) .... One at dinner 14)  Unisom 25 Mg Tabs (Doxylamine succinate (sleep)) .... 1/2 tablet daily 15)  Triamcinolone Acetonide 0.5 % Crea  (Triamcinolone acetonide) .... Aaa twice a day for 2 weeks 16)  Azelastine Hcl 137 Mcg/spray Soln (Azelastine hcl) .... 2 sprays per nostril daily  Hypertension Assessment/Plan:      The patient's hypertensive risk group is category B: At least one risk factor (excluding diabetes) with no target organ damage.  Today's blood pressure is 130/70.  Her blood pressure goal is < 140/90.  Lipid Assessment/Plan:      Based on NCEP/ATP III, the patient's risk factor category is "0-1 risk factors".  The patient's lipid goals are as follows: Total cholesterol goal is 200; LDL cholesterol goal is 160; HDL cholesterol goal is 40; Triglyceride goal is 150.  Her LDL cholesterol goal has been met.    Patient Instructions: 1)  Cut back on caffeine as able. 2)  Continue to follow BP. 3)   Apply steroid cream for 2 weeks .. call if not improving.  4)  Referral Appointment Information 5)  Day/Date: 6)  Time: 7)  Place/MD: 8)  Address: 9)  Phone/Fax: 10)  Patient given appointment information. Information/Orders faxed/mailed.  11)   Follow up as needed or in 1 year.  Prescriptions: AZELASTINE HCL 137 MCG/SPRAY SOLN (AZELASTINE HCL) 2 sprays per nostril daily  #1 x 11   Entered and Authorized by:   Kerby Nora MD   Signed by:   Kerby Nora MD on 07/15/2010   Method used:   Electronically to        [facility] [phone]* (retail)       [address] [place], [place]  [address]       Ph: [phone]       Fax: [phone]   RxID:   [id number] METRONIDAZOLE 0.75 % EXT GEL (METRONIDAZOLE) as needed rosacea  #45 gm x 3   Entered and Authorized by:   Kerby Nora MD   Signed by:   Kerby Nora MD on 07/15/2010   Method used:   Electronically to        [facility] [phone]* (retail)       [address] [place], [place]  [address]       Ph: [phone]       Fax: [phone]   RxID:   [id number] NITROSTAT 0.4 MG SUBL (NITROGLYCERIN) as needed  #1 bottle x 0   Entered  and Authorized by:   Kerby Nora MD   Signed by:   Kerby Nora MD on 07/15/2010   Method used:   Electronically to        [facility] [phone]* (retail)       [address] [place], [place]  [address]       Ph: [phone]  Fax: [phone]   RxID:   [id number] DILACOR XR 240 MG  CP24 (DILTIAZEM HCL) 1 by mouth once daily  #30 x 11   Entered and Authorized by:   Kerby Nora MD   Signed by:   Kerby Nora MD on 07/15/2010   Method used:   Electronically to        [facility] [phone]* (retail)       [address] [place], [place]  [address]       Ph: [phone]       Fax: [phone]   RxID:   [id number] TRIAMCINOLONE ACETONIDE 0.5 % CREA (TRIAMCINOLONE ACETONIDE) AAA twice a day for 2 weeks  #30 gm x 0   Entered and Authorized by:   Kerby Nora MD   Signed by:   Kerby Nora MD on 07/15/2010   Method used:   Electronically to        [facility] [phone]* (retail)       [address] [place], [place]  [address]       Ph: [phone]       Fax: [phone]   RxID:   [id number]    Orders Added: 1)  Rheumatology Referral [Rheumatology] 2)  Radiology Referral [Radiology] 3)  Est. Patient Level IV [[address]]    Current Allergies (reviewed today): ! PENICILLIN ! ACE INHIBITORS ! * LATEX  Last Flu Vaccine:  given (05/14/2009 11:48:38 AM) Flu Vaccine Result Date:  05/14/2010 Flu Vaccine Result:  given Flu Vaccine Next Due:  1 yr Last Colonoscopy:  Normal (08/15/2003 11:42:28 AM) Colonoscopy Result Date:  04/06/2010 Colonoscopy Result:  normal, diverticulosis Colonoscopy Next Due:  5 yr Last PAP:  Normal (02/12/2008 11:25:43 AM) PAP Result Date:  02/11/2010 PAP Result:  normal PAP Next Due:  1 yr Last Mammogram:  ASSESSMENT: Negative - BI-RADS 1^MM DIGITAL SCREENING (02/25/2009 9:10:00 AM) Mammogram Result Date:  03/14/2010 Mammogram Result:  normal Mammogram Next Due:  1 yr Last Bone Density:  abnormal,  osteopenia  (02/12/2008 11:26:13 AM) Bone Density Result Date:  03/14/2010 Bone Density Result:  stable osteoporosis Bone Density Next Due: 2 yr

## 2010-09-15 NOTE — Assessment & Plan Note (Signed)
Summary: NEW PT   Visit Type:  Initial Consult Primary Provider:  Kerby Nora MD  CC:  c/o twinges in chest and increased blood pressure. Denies shortness of breath. Has some dizziness.[name]  History of Present Illness: 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  Her main concern is her blood pressure. She has had systolic pressures recorded typically greater than 150 over the past month. Some of his blood pressure has gone as high as 190 and 200. She has her blood pressure recorded at an outside pharmacy who has recorded these for [place] today. She is otherwise active. She likes to treadmill, use her bike, do gardening.  She denies any significant chest pain, lightheadedness, shortness of breath with exertion. she has felt more tired on metoprolol than usual.  Last stress test in April 2007 was reportedly negative  EKG shows sinus bradycardia with rate of 54 beats per minute, LVH by voltage criteria in the limb leads  Current Medications (verified): 1)  Dilacor Xr 240 Mg  Cp24 (Diltiazem Hcl) .[name].. 1 By Mouth Once Daily 2)  Adult Aspirin Ec Low Strength 81 Mg  Tbec (Aspirin) .... Once Daily 3)  Fish Oil   Oil (Fish Oil) .[name].. 1000 Mg. Two Tablets By Mouth Two Times A Day On Hold Due To Possible Cataract Surgery 4)  Caltrate 600 1500 Mg  Tabs (Calcium Carbonate) .[name].. 1 By Mouth Two Times A Day 5)  Multivitamins   Tabs (Multiple Vitamin) .... Once Daily 6)  Tylenol 325 Mg  Tabs (Acetaminophen) .... or Motrin As Needed 7)  Neomycin-Polymyxin-Dexameth 0.5-10000-0.1  Oint (Neomycin-Polymyxin-Dexameth) .... As Needed 8)  Metronidazole 0.75 % Ext Gel (Metronidazole) .... As Needed Rosacea 9)  Estradiol 0.05 Mg/24hr Ptwk (Estradiol) 10)  Nitrostat 0.4 Mg Subl (Nitroglycerin) .... As  Needed 11)  Systane 0.4-0.3 % Soln (Polyethyl Glycol-Propyl Glycol) .... Eye Drop S As Directed 12)  Cq 10 200mg  .... With Meals Daily 13)  Vitamin B-1 250 Mg  Tabs (Thiamine Hcl) .... One At Dinner 14)  Unisom 25 Mg Tabs (Doxylamine Succinate (Sleep)) .... 1/2 Tablet Daily 15)  Triamcinolone Acetonide 0.5 % Crea (Triamcinolone Acetonide) .... Aaa Twice A Day For 2 Weeks 16)  Azelastine Hcl 137 Mcg/spray Soln (Azelastine Hcl) .... 2 Sprays Per Nostril Daily 17)  Metoprolol Succinate 25 Mg Xr24h-Tab (Metoprolol Succinate) .[name].. 1 By Mouth Daily  Allergies (verified): 1)  ! Penicillin 2)  ! Ace Inhibitors 3)  ! * Angiotensin Receptor Blockers 4)  ! * Latex  Past History:  Past Medical History: Last updated: 08/01/2010 Hypertension Allergic rhinitis Hyperlipidemia Osteoarthritis history of MI (1996) OSA  Past Surgical History: Last updated: Jun 20, 2007 1991 SAH 1992 total hysterectomy for mennorhagia 1996 MI hospitalize, vasospasms, heart cath no blockage 1998 ethmoidectomy 1998 sinus bx showing inverted papiloma  Family History: Last updated: 2007-06-20 father died age 68 colon cancer mother died age 53 MI, [name], osteoporosis 2 sisters GI problems, GERD, HTN, osteopenia aunt breast cancer no MI < age 50  Social History: Last updated: 06-20-2007 Occupation: RN, [employer] Married Never Smoked Alcohol use-yes, rare Drug use-no Regular exercise-no, used to do Yoga Diet: [employer] Diet  Risk Factors: Exercise: no (06/20/2007)  Risk Factors: Smoking Status: never (06-20-07)  Review of Systems  The patient denies fever, weight loss, weight gain, vision loss, decreased hearing,  hoarseness, chest pain, syncope, dyspnea on exertion, peripheral edema, prolonged cough, abdominal pain, incontinence, muscle weakness, depression, and enlarged lymph nodes.         fatigue   Vital Signs:  Patient profile:   70 year old female Height:      65.5 inches Weight:       148.50 pounds BMI:     24.42 Pulse rate:   54 / minute BP sitting:   142 / 72  (left arm) Cuff size:   regular  Vitals Entered By: Bishop Dublin, CMA (August 05, 2010 11:37 AM)  Physical Exam  General:  Well developed, well nourished, in no acute distress. Head:  normocephalic and atraumatic Neck:  Neck supple, no JVD. No masses, thyromegaly or abnormal cervical nodes. Lungs:  Clear bilaterally to auscultation and percussion. Heart:  Non-displaced PMI, chest non-tender; regular rate and rhythm, S1, S2 without murmurs, rubs or gallops. Carotid upstroke normal, no bruit.  Pedals normal pulses. No edema, no varicosities. Abdomen:  Bowel sounds positive; abdomen soft and non-tender without masses Msk:  Back normal, normal gait. Muscle strength and tone normal. Pulses:  pulses normal in all 4 extremities Extremities:  No clubbing or cyanosis. Neurologic:  Alert and oriented x 3. Skin:  Intact without lesions or rashes. Psych:  Normal affect.   Impression & Recommendations:  Problem # 1:  HYPERTENSION (ICD-401.9) pressure has been elevated as an outpatient. She does have some fatigue on metoprolol. We have suggested that she cut her metoprolol succinate and half and take 12.5 mg daily. We will ask clonidine 0.1 mg b.i.d.. We are limited given her problems in the past to ACE inhibitors and ARB's.  Her updated medication list for this problem includes:    Dilacor Xr 240 Mg Cp24 (Diltiazem hcl) .[name]... 1 by mouth once daily    Adult Aspirin Ec Low Strength 81 Mg Tbec (Aspirin) ..... Once daily    Metoprolol Succinate 25 Mg Xr24h-tab (Metoprolol succinate) .[name]... Take 1/2 tablet once daily    Clonidine Hcl 0.1 Mg Tabs (Clonidine hcl) .[name]... Take one tablet by mouth twice a day  Problem # 2:  HYPERLIPIDEMIA (ICD-272.4) We did mention that her cholesterol is very elevated and given her previous history of coronary artery disease, we have suggested she start on Lipitor 10 mg. She could  start every other day with a slow titration to every day with a check of her cholesterol and LFTs in 3 months time.  Her updated medication list for this problem includes:    Lipitor 10 Mg Tabs (Atorvastatin calcium) .[name]... Take one tablet by mouth daily.  Problem # 3:  MYOCARDIAL INFARCTION, HX OF (ICD-412) the details of her old MI in 1996 are unavailable to [place] at this time. Notes suggest coronary spasm as the cause. Her cholesterol is elevated and we will treat her aggressively for now. No further episodes of spasm or chest pain for several years.  She is on a calcium channel blocker for her spasm.  Her updated medication list for this problem includes:    Dilacor Xr 240 Mg Cp24 (Diltiazem hcl) .[name]... 1 by mouth once daily    Adult Aspirin Ec Low Strength 81 Mg Tbec (Aspirin) ..... Once daily    Nitrostat 0.4 Mg Subl (Nitroglycerin) .[name]... As needed    Metoprolol Succinate 25 Mg Xr24h-tab (Metoprolol succinate) .[name]... Take 1/2 tablet once daily  Patient Instructions: 1)  Your physician recommends that you follow up as needed. 2)  Your physician has recommended  you make the following change in your medication: DECREASE Metoprolol Succinate 25mg  1/2 tablet once daily. START Clonidine 0.1mg  two times a day. START Lipitor 10mg  once daily. Prescriptions: CLONIDINE HCL 0.1 MG TABS (CLONIDINE HCL) Take one tablet by mouth twice a day  #60 x 6   Entered by:   Lanny Hurst RN   Authorized by:   Dossie Arbour MD   Signed by:   Lanny Hurst RN on 08/05/2010   Method used:   Electronically to        [facility] [phone]* (retail)       [address] [place], [place]  [address]       Ph: [phone]       Fax: [phone]   RxID:   [id number] LIPITOR 10 MG TABS (ATORVASTATIN CALCIUM) Take one tablet by mouth daily.  #30 x 6   Entered by:   Lanny Hurst RN   Authorized by:   Dossie Arbour MD   Signed by:   Lanny Hurst RN on 08/05/2010   Method used:   Electronically to        [facility] [phone]* (retail)       [address] [place], [place]  [address]       Ph: [phone]       Fax: [phone]   RxID:   [id number]

## 2010-09-15 NOTE — Progress Notes (Signed)
Summary: Dizzy/Weakness/BP results  Phone Note Call from Patient Call back at Home Phone [phone]   Caller: Patient Call For: nurse/gollan Summary of Call: BP readings- 08/20/10-160/68 P-68 08/21/2010-a.m. 158/60 P-68 p.m.- 150/60 P-68 08/22/2010-a.m. 150/74 P-60  08/23/2010 139/61 P-57 Pt doesn't feel as dizzy as she has been but still feels weak. Initial call taken by: Lysbeth Galas CMA,  August 23, 2010 3:45 PM  Follow-up for Phone Call        Spoke to pt, notified her that BP's are definately high enough not to be causing dizziness/weakness. HR's staying in the 60s on average and these are great also. Pt states she has had recent bloodwork with pcp to r/o other reasons for weakness. Pt asks since she has been on Clonidine since end of December should she be seeing better BP results by now? And any other recommenations for pt's symptoms of dizziness/weakness? Follow-up by: Lanny Hurst RN,  August 24, 2010 11:56 AM  Additional Follow-up for Phone Call Additional follow up Details #1::        Clonidine dose is low. Could try 1 1/2 in AM and PM, slowly go up to 0.2 mg two times a day after one week if needed.      Appended Document: Dizzy/Weakness/BP results Attempted to call pt, LMOM TCB /MES  Appended Document: Dizzy/Weakness/BP results Spoke to pt, gave recommendations as above. Pt will start taking clonidine 0.1mg  1.5tablets two times a day, will monitor BP, if remains stable, she will then incr to 2 tablets two times a day.

## 2010-09-15 NOTE — Assessment & Plan Note (Signed)
Summary: 11:30 Patients bp high/hmw   Vital Signs:  Patient profile:   70 year old female Height:      65.5 inches Weight:      148.12 pounds BMI:     24.36 Temp:     97.9 degrees F oral Pulse rate:   76 / minute Pulse rhythm:   regular BP sitting:   140 / 92  (left arm) Cuff size:   regular  Vitals Entered By: Benny Lennert CMA Duncan Dull) (August 01, 2010 11:24 AM)  History of Present Illness: Chief complaint bp high  70 year old female:  HTN, deteriorated: 140's-190's/90's in a setting of a prior MI in the 1990's.  h/o ACE angioedema.  on Cardizem for rate control.  h/o MI, 64. Used to be a patient of Dr. Garnette Scheuermann, now without a Cardiologist. Self-d/c post MI medications.   Allergies: 1)  ! Penicillin 2)  ! Ace Inhibitors 3)  ! * Angiotensin Receptor Blockers 4)  ! * Latex  Past History:  Past medical, surgical, family and social histories (including risk factors) reviewed, and no changes noted (except as noted below).  Past Medical History: Hypertension Allergic rhinitis Hyperlipidemia Osteoarthritis history of MI (1996) OSA  Past Surgical History: Reviewed history from 05/29/2007 and no changes required. 1991 SAH 1992 total hysterectomy for mennorhagia 1996 MI hospitalize, vasospasms, heart cath no blockage 1998 ethmoidectomy 1998 sinus bx showing inverted papiloma  Family History: Reviewed history from 05/29/2007 and no changes required. father died age 68 colon cancer mother died age 43 MI, [name], osteoporosis 2 sisters GI problems, GERD, HTN, osteopenia aunt breast cancer no MI < age 7  Social History: Reviewed history from 05/29/2007 and no changes required. Occupation: Charity fundraiser, [employer] Married Never Smoked Alcohol use-yes, rare Drug use-no Regular exercise-no, used to do Yoga Diet: [employer] Diet  Physical Exam  Additional Exam:  GEN: WDWN, NAD, Non-toxic, A & O x 3 HEENT: Atraumatic, Normocephalic. Neck supple. No masses,  No LAD. Ears and Nose: No external deformity. CV: RRR, No M/G/R. No JVD. No thrill. No extra heart sounds. PULM: CTA B, no wheezes, crackles, rhonchi. No retractions. No resp. distress. No accessory muscle use. EXTR: No c/c/e NEURO: Normal gait.  PSYCH: Normally interactive. Conversant. Not depressed or anxious appearing.  Calm demeanor.     Impression & Recommendations:  Problem # 1:  HYPERTENSION (ICD-401.9) Assessment Deteriorated d/c Celebrex  post-MI, would prefer b-blockade to dilt in this case poor HTN control ACE and ARB contraindicated (Angioedema) on ASA Statin therapy indicated  I asked the pt to consult with Dr. Mariah Milling. Would think of titrating off ca-channel blocker, increasing b-blocker, potential hctz if needed for pure BP control  Her updated medication list for this problem includes:    Dilacor Xr 240 Mg Cp24 (Diltiazem hcl) .[name]... 1 by mouth once daily    Metoprolol Succinate 25 Mg Xr24h-tab (Metoprolol succinate) .[name]... 1 by mouth daily  Problem # 2:  MYOCARDIAL INFARCTION, HX OF (ICD-412)  Her updated medication list for this problem includes:    Dilacor Xr 240 Mg Cp24 (Diltiazem hcl) .[name]... 1 by mouth once daily    Adult Aspirin Ec Low Strength 81 Mg Tbec (Aspirin) ..... Once daily    Nitrostat 0.4 Mg Subl (Nitroglycerin) .[name]... As needed    Metoprolol Succinate 25 Mg Xr24h-tab (Metoprolol succinate) .[name]... 1 by mouth daily  Orders: Cardiology Referral (Cardiology)  Problem # 3:  OSTEOARTHRITIS (ICD-715.90) d/c celebrex  Her updated medication list for this  problem includes:    Adult Aspirin Ec Low Strength 81 Mg Tbec (Aspirin) ..... Once daily    Tylenol 325 Mg Tabs (Acetaminophen) ..... Or motrin as needed  Complete Medication List: 1)  Dilacor Xr 240 Mg Cp24 (Diltiazem hcl) .[name].. 1 by mouth once daily 2)  Adult Aspirin Ec Low Strength 81 Mg Tbec (Aspirin) .... Once daily 3)  Fish Oil Oil (Fish oil) .[name].. 1000 mg. two tablets by mouth two times a day  on hold due to possible cataract surgery 4)  Caltrate 600 1500 Mg Tabs (Calcium carbonate) .[name].. 1 by mouth two times a day 5)  Multivitamins Tabs (Multiple vitamin) .... Once daily 6)  Tylenol 325 Mg Tabs (Acetaminophen) .... Or motrin as needed 7)  Neomycin-polymyxin-dexameth 0.5-10000-0.1 Oint (Neomycin-polymyxin-dexameth) .... As needed 8)  Metronidazole 0.75 % Ext Gel (Metronidazole) .... As needed rosacea 9)  Estradiol 0.05 Mg/24hr Ptwk (Estradiol) 10)  Nitrostat 0.4 Mg Subl (Nitroglycerin) .... As needed 11)  Systane 0.4-0.3 % Soln (Polyethyl glycol-propyl glycol) .... Eye drop s as directed 12)  Cq 10 200mg   .... With meals daily 13)  Vitamin B-1 250 Mg Tabs (Thiamine hcl) .... One at dinner 14)  Unisom 25 Mg Tabs (Doxylamine succinate (sleep)) .... 1/2 tablet daily 15)  Triamcinolone Acetonide 0.5 % Crea (Triamcinolone acetonide) .... Aaa twice a day for 2 weeks 16)  Azelastine Hcl 137 Mcg/spray Soln (Azelastine hcl) .... 2 sprays per nostril daily 17)  Metoprolol Succinate 25 Mg Xr24h-tab (Metoprolol succinate) .[name].. 1 by mouth daily  Patient Instructions: 1)  Referral Appointment Information 2)  Day/Date: 3)  Time: 4)  Place/MD: 5)  Address: 6)  Phone/Fax: 7)  Patient given appointment information. Information/Orders faxed/mailed.  Prescriptions: METOPROLOL SUCCINATE 25 MG XR24H-TAB (METOPROLOL SUCCINATE) 1 by mouth daily  #30 x 1   Entered and Authorized by:   Hannah Beat MD   Signed by:   Hannah Beat MD on 08/01/2010   Method used:   Electronically to        [facility] [phone]* (retail)       [address] [place], [place]  [address]       Ph: [phone]       Fax: [phone]   RxID:   [id number]    Orders Added: 1)  Cardiology Referral [Cardiology] 2)  Est. Patient Level IV [24401]    Current Allergies (reviewed today): ! PENICILLIN ! ACE INHIBITORS ! * ANGIOTENSIN RECEPTOR BLOCKERS ! * LATEX

## 2010-09-15 NOTE — Progress Notes (Signed)
Summary: Side Effects  Phone Note Call from Patient Call back at Home Phone [phone]   Caller: self Call For: Kristin [name] Summary of Call: Pt feels like she is having side effects from Metoprolol and Clonadone (?).  Pulse has 41-58.  Feeling fatigue and dizziness. Initial call taken by: Harlon Flor,  August 16, 2010 3:04 PM  Follow-up for Phone Call        Spoke to pt yesterday 08/17/10, we decr her metoprolol from 25mg  once daily to 12.5mg  daily, and started clonidine 0.1mg  two times a day. Advised pt to hold Metoprolol if HR in 40s. Followed up today 08/18/10 with pt she states she has taken Clonidine and still holding Metoprolol. Took orthostatic bp: sitting 180/73 HR 54 standing 166/57 HR 50. Pt states she these symptoms have worsened since started Clonidine. In addition to pt recording BP's and HR's any other recommendations? Follow-up by: Lanny Hurst RN,  August 18, 2010 4:34 PM  Additional Follow-up for Phone Call Additional follow up Details #1::        Would hold metoprolol. Heart rate is low.  Would continue to take clonidine two times a day through the weekend. Clonidine should help BP. If she continues to have dizziness or side effects, call on Monday. BP still running high, so dizziness not from a low BP.       Appended Document: Side Effects pt notified of above, she will call Monday if symptoms not better /MES

## 2010-09-15 NOTE — Letter (Signed)
Summary: [facility]  [facility]   Imported By: Lanelle Bal 08/01/2010 11:58:54  _____________________________________________________________________  External Attachment:    Type:   Image     Comment:   External Document

## 2010-09-20 ENCOUNTER — Telehealth: Payer: Self-pay | Admitting: Cardiovascular Disease

## 2010-09-21 NOTE — Progress Notes (Signed)
Summary: BP readings  Phone Note Call from Patient Call back at [facility] Phone [phone]   Caller: Patient Call For: Nurse-Megan Summary of Call: c/o of "dizziness and staggering". 2/7-160-68 P-68, 2/8-158/60 P-68, 2/9 150-74 p-60, 2/10 -139-69 P-57, Today 135/62 P-57.  Initial call taken by: Lysbeth Galas CMA,  September 07, 2010 12:11 PM  Follow-up for Phone Call        LMOM TCB. Follow-up by: Bishop Dublin, CMA,  September 08, 2010 11:19 AM  Additional Follow-up for Phone Call Additional follow up Details #1::        pt calling back wanting to speak to [facility]  Notified patient of blood pressure today 140/62 heart rate 46 with feeling very fatigue and dizzy. Additional Follow-up by: Lysbeth Galas CMA,  September 08, 2010 12:42 PM    Additional Follow-up for Phone Call Additional follow up Details #2::    Pt has already held Metoprolol for same symptoms in past. Pt is currently on Clonidine 0.1mg  1.5mg  two times a day and Dilacor XR 240mg  once daily. Pt is concerned but still planning to go to the gym to workout, pt states is going to "push herself to go to gym, but will go." Any recommendations for HR? Follow-up by: Lanny Hurst RN,  September 08, 2010 5:08 PM  Additional Follow-up for Phone Call Additional follow up Details #3:: Details for Additional Follow-up Action Taken: Blood pressure not bad. Still a little high, but ok for now. If her heart rate continues to run low, we coudl hold diltiazem and change to amlodipine 10 mg daily. Numbers like these should typically not cause fatigue and weakness. When did those symptoms start?  New/Updated Medications: CLONIDINE HCL 0.1 MG TABS (CLONIDINE HCL) Take one and half tablet by mouth twice a day  Appended Document: BP readings Spoke to pt this am and gave recommendation above. Pt is convinced that it is the clonidine that is causing her symptoms. She states her symptoms of "fatigue, weakness" started right after she started  Clonidine.  Appended Document: BP readings pt wants you to give her a call back 09-15-2010/sab  Appended Document: BP readings Spoke to pt, advised her to stop the Clonidine, and let us know when her symptoms have improved per Dr. Mariah Milling. Advised her to monitor her BP during this time and call us if BP is trending too high and to write down daily BP numbers. Once she is feeling better, to call our office any we can change meds accordingly if needed.

## 2010-09-29 NOTE — Progress Notes (Addendum)
Summary: Calling Kristin [name]  Phone Note Call from Patient Call back at [facility] Phone [phone]   Caller: Self Call For: Kristin [name] Summary of Call: Pt states that she was talking with Kristin [name] yesterday and would like to speak to her again. Initial call taken by: Harlon Flor,  September 20, 2010 11:35 AM  Follow-up for Phone Call        Attempted to call pt, LMOM TCB /MES Lanny Hurst RN  September 20, 2010 12:13 PM   Spoke to pt, she states she stopped Clonidine 09/15/10 and BP incr so much that she had to start back 09/17/10 her SBP had incr to 194. (We had pt stop the Clonidine because she was convinced her dizziness and weakness was r/t med, although she was advised that this was unlikely) Pt was to call us back after her dizziness had subsided post stopping Clonidine and she states it never did. She is now taking Clonidine same dosage as before. She is asking now if she can try different medication. You had suggested trying Amlodipine 10mg  in previous note, do you recommend I call this in for pt? Pt states she beleives she will just have to get used to the dizziness because many types of blood pressure meds cause this for her. Follow-up by: Lanny Hurst RN,  September 20, 2010 1:33 PM  Additional Follow-up for Phone Call Additional follow up Details #1::        Would not start amlodipine, she is already on a calcium channel blocker. Running out of options. Is she sure she has problems with ACE and ARB? Can add HCTZ 25 mg daily, Can add hydralazine 50 mg by mouth TID     Appended Document: Calling Kristin [name] Attempted to contact pt, LMOM TCB /MES  Appended Document: Calling Kristin [name] Pt confirms she is allergic to the ACE Inhibitors, but is not certain about the ARB's, I listed all the drugs under this class and she does not recall taking any of them. Pt states she will try an ARB if that is what you recommend trying.  Appended Document: Calling Kristin [name] start losartan 100 mg daily  Appended Document:  Calling Kristin [name] Attempted to contact pt LMOM TCB /MES  Appended Document: Calling Kristin [name] Spoke to pt, notified of recommendation, she will stop clonidine and start Losartan 100mg  once daily and notify me if this is working for her.   Clinical Lists Changes  Medications: Removed medication of CLONIDINE HCL 0.1 MG TABS (CLONIDINE HCL) Take one and half tablet by mouth twice a day Added new medication of LOSARTAN POTASSIUM 100 MG TABS (LOSARTAN POTASSIUM) Take one tablet once daily. - Signed Rx of LOSARTAN POTASSIUM 100 MG TABS (LOSARTAN POTASSIUM) Take one tablet once daily.;  #30 x 6;  Signed;  Entered by: Lanny Hurst RN;  Authorized by: Dossie Arbour MD;  Method used: Electronically to [facility] [phone]*, [address] [place], [place]  [address], Ph: [phone], Fax: [phone]    Prescriptions: LOSARTAN POTASSIUM 100 MG TABS (LOSARTAN POTASSIUM) Take one tablet once daily.  #30 x 6   Entered by:   Lanny Hurst RN   Authorized by:   Dossie Arbour MD   Signed by:   Lanny Hurst RN on 09/27/2010   Method used:   Electronically to        [facility] [phone]* (retail)       [address] [place], [place]  [address]  Ph: [phone]       Fax: [phone]   RxID:   [id number]

## 2010-10-11 ENCOUNTER — Encounter: Payer: Self-pay | Admitting: Cardiovascular Disease

## 2010-10-14 ENCOUNTER — Encounter: Payer: Self-pay | Admitting: Family Medicine

## 2010-10-20 NOTE — Letter (Addendum)
Summary: BP readings  BP readings   Imported By: Lysbeth Galas CMA 10/11/2010 14:38:25  _____________________________________________________________________  External Attachment:    Type:   Image     Comment:   External Document  Appended Document: BP readings Pt's BP readings after starting Losartan 100mg , pt has stopped her Clonidine 0.1mg . Preliminarily reviewed. Forwarded to MD desktop for review and signature /MES  Appended Document: BP readings Pt wanting to know if she could incr Losartan and/or restart Clonidine?  Appended Document: BP readings BP is still high on losartan and diltiazem Would either restart clonidine or could try HCTZ 25 mg daily  Appended Document: BP readings Spoke to pt, notified of msg above, pt will start HCTZ 25mg  once daily. Sent Rx to pharmacy.   Clinical Lists Changes  Medications: Added new medication of HYDROCHLOROTHIAZIDE 25 MG TABS (HYDROCHLOROTHIAZIDE) Take one tablet by mouth daily. - Signed Rx of HYDROCHLOROTHIAZIDE 25 MG TABS (HYDROCHLOROTHIAZIDE) Take one tablet by mouth daily.;  #30 x 6;  Signed;  Entered by: Lanny Hurst RN;  Authorized by: Dossie Arbour MD;  Method used: Electronically to [facility] [phone]*, [address] [place], [place]  [address], Ph: [phone], Fax: [phone]    Prescriptions: HYDROCHLOROTHIAZIDE 25 MG TABS (HYDROCHLOROTHIAZIDE) Take one tablet by mouth daily.  #30 x 6   Entered by:   Lanny Hurst RN   Authorized by:   Dossie Arbour MD   Signed by:   Lanny Hurst RN on 10/20/2010   Method used:   Electronically to        [facility] [phone]* (retail)       [address] [place], [place]  [address]       Ph: [phone]       Fax: [phone]   RxID:   [id number]

## 2010-10-25 NOTE — Letter (Signed)
Summary: Trend analysis-DynaPulse BP Report  Trend analysis-DynaPulse BP Report   Imported By: Kassie Mends 10/19/2010 11:44:11  _____________________________________________________________________  External Attachment:    Type:   Image     Comment:   External Document

## 2010-10-25 NOTE — Letter (Signed)
Summary: [facility]   [facility]   Imported By: Kassie Mends 10/18/2010 09:16:41  _____________________________________________________________________  External Attachment:    Type:   Image     Comment:   External Document

## 2010-11-29 ENCOUNTER — Encounter: Payer: Self-pay | Admitting: Family Medicine

## 2010-11-30 ENCOUNTER — Encounter: Payer: Self-pay | Admitting: Family Medicine

## 2010-11-30 ENCOUNTER — Ambulatory Visit (INDEPENDENT_AMBULATORY_CARE_PROVIDER_SITE_OTHER): Payer: Medicare Other | Admitting: Family Medicine

## 2010-11-30 VITALS — BP 128/70 | HR 80 | Temp 98.4°F | Ht 66.0 in | Wt 152.0 lb

## 2010-11-30 DIAGNOSIS — J40 Bronchitis, not specified as acute or chronic: Secondary | ICD-10-CM | POA: Insufficient documentation

## 2010-11-30 MED ORDER — AZITHROMYCIN 250 MG PO TABS: ORAL_TABLET | ORAL | Status: AC

## 2010-11-30 MED ORDER — GUAIFENESIN-CODEINE 100-10 MG/5ML PO SYRP: 5.0000 mL | ORAL_SOLUTION | Freq: Every evening | ORAL | Status: AC | PRN

## 2010-11-30 NOTE — Progress Notes (Signed)
  Subjective:    Patient ID: Kristin [name], female    DOB: 1941-03-18, 70 y.o.   MRN: [id number]  HPI CC: ? Pna?  1 wk h/o congestion, cough.  Kept getting worse.  Then started having rigors.  Took temp and was 101.  Usually goes up in evenings.  + mucous and nose stopped up.  Then had chest pain, felt more MSK.  Advil helped.  + ST, yellow-green mucous out of nose and lung.  Waking up at night with cough and dry throat.  So far has tried delsym cough syrup and advil for temperature.  Cough keeping her up at night.  No abd pain, n/v/d, rashes, myalgia.  + joint pains, h/o inflammatory arthritis.  Husband with pulm disease, coughing but seems to be at baseline.  No smokers at home.  + h/o allergies, but no asthma/COPD.  H/o MI, states chest painthis did not feel like that.  Has had sinus surgery.  Review of Systems Per HPI    Objective:   Physical Exam  Vitals reviewed. Constitutional: She appears well-developed and well-nourished. No distress.  HENT:  Head: Normocephalic and atraumatic.  Right Ear: Tympanic membrane, external ear and ear canal normal.  Left Ear: External ear and ear canal normal.  Nose: No mucosal edema or rhinorrhea. Right sinus exhibits no maxillary sinus tenderness and no frontal sinus tenderness. Left sinus exhibits no maxillary sinus tenderness and no frontal sinus tenderness.  Mouth/Throat: Uvula is midline, oropharynx is clear and moist and mucous membranes are normal. No oropharyngeal exudate.       Some congestion L TM. Some nasal congestion, dry mucous.  Eyes: Conjunctivae and EOM are normal. Pupils are equal, round, and reactive to light. No scleral icterus.  Neck: Normal range of motion. Neck supple. No JVD present. No thyromegaly present.  Cardiovascular: Normal rate, regular rhythm, normal heart sounds and intact distal pulses.   No murmur heard. Pulmonary/Chest: Effort normal and breath sounds normal. No respiratory distress. She has no wheezes. She  has no rales.  Lymphadenopathy:    She has no cervical adenopathy.  Skin: Skin is warm and dry. No rash noted.          Assessment & Plan:

## 2010-11-30 NOTE — Assessment & Plan Note (Signed)
7 d duration so far. abx to hold on to, red flags to return discussed. No PNA on exam today. See pt instructions.

## 2010-11-30 NOTE — Patient Instructions (Signed)
Sounds like you have an upper respiratory infection, possible developing bronchitis. Viral infections usually take 7-10 days to resolve.  The cough can last 4 weeks to go away. Use medication as prescribed: cheratussin for night time, zpack in case not improving as expected. Simple mucinex or delsyum with plenty of fluid. Push fluids and plenty of rest. Please return if you are not improving as expected, or if you have high fevers (>101.5) or difficulty swallowing or worsening productive cough. Call clinic with questions.  Pleasure to see you today.

## 2010-12-07 ENCOUNTER — Telehealth: Payer: Self-pay | Admitting: *Deleted

## 2010-12-07 NOTE — Telephone Encounter (Signed)
Called and spoke to pt regarding BP/HR results she left at office. Notified pt numbers look good. She was changed to Losartan 100mg  q.d and HCTZ 25mg  q.d in Feb 2012 after many changes in BP meds over past several months. Pt had questions of Losartan causing hyperkalemia and HCTZ needing monitoring of kidney function. Notified pt that along with all meds having side effects, that Losartan can incr potassium, but no labs need to be drawn unless pt symptomatic and that annual labs at her PCP will be sufficient. Notified pt that if any abnormal labs return, pt's medications are looked at first and would be addressed as needed. Notified pt that her last labs drawn looked great. Pt will follow up here as needed as stated at last ov with Dr. Mariah Milling and she has no c/o at this time.

## 2010-12-30 NOTE — Op Note (Signed)
[place]  Patient:    Kristin [name], FULOP                        MRN: [id number] Proc. Date: 05/23/00 Adm. Date:  [id number] Attending:  Susy Frizzle CC:         Genene Churn. Sherin Quarry, M.D.   Operative Report  PREOPERATIVE DIAGNOSES:   Chronic left frontal sinusitis.  POSTOPERATIVE DIAGNOSES:  Chronic left frontal sinusitis.  OPERATION:  Revision endoscopic left frontal sinusotomy.  SURGEON:  Jefry H. Pollyann Kennedy, M.D.  ANESTHESIA:  General endotracheal anesthesia.  COMPLICATIONS:  None.  ESTIMATED BLOOD LOSS: Minimal.  FINDINGS:  Fibrotic tissue obstructing the outflow tract of the left frontal sinus.  Left frontal sinus completely impacted with thick mucopurulent material.  Small bony dehiscence of the anterior ethmoid roof with dural tissue intact and healthy in appearance.  The patient tolerated the procedure well the procedure well, was awakened, extubated, and transferred to recovery in stable condition.  REFERRING PHYSICIAN:  Genene Churn. Sherin Quarry, M.D.  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  Risks, benefits, alternatives, complications of the procedure were explained to the patient who seemed to understand and agreed to the surgery.  DESCRIPTION OF PROCEDURE:  The patient was taken to the operating room and placed on the operating table in supine position.  Following induction of general endotracheal anesthesia, the patient was draped in the standard fashion.  Oxymetazoline spray was used in the nose preoperatively; 1% Xylocaine with 1:100,000 epinephrine was infiltrated into the superior and posterior attachment of the left middle turbinate.  A total of 2 cc was injected.  The 0 degree, 30 degree, and 70 degree nasal  endoscopes were used to visualize the ethmoid and frontal sinus area.  Careful inspection around the nasal cavity, maxillary, ethmoid, and sphenoid sinuses was performed, and everything was clear and healthy without any evidence of residual tumor.  The middle turbinate remnant was slightly lateralized and adherent in some areas to the lateral nasal wall on the left side.  These adhesions were taken down, and a residual ethmoid cell was opened up and exonerated.  There was no diseased mucosa present.  The anterior portion of the turbinate remnant was resected to facilitate exposure into the frontal sinus ostium.  After this was performed, using a 30 degree nasal endoscope, a sinus seeker was used to carefully palpate just anterior to the ethmoid roof and easy entrance into the frontal sinus was accomplished.  This was enlarged with a curved suction, and a large amount of debris was aspirated from the sinus.  The posterior wall of the opening was taken down with straight through-cut forceps back to the anterior ethmoid roof.  Just posterior to this is where the dehiscent area was identified.  This area was left unmolested.  There was no evidence of CSF leak.  The frontal sinus was packed with Gelfoam and then saturated with Cortisporin ointment.  The nasal cavity and pharynx was suction of blood and secretions.  The patient was then awakened, extubated, and transferred to recovery in stable condition. DD:  05/23/00 TD:  05/24/00 Job: [id number]

## 2010-12-30 NOTE — H&P (Signed)
NAME:  Kristin [name], Kristin [name]              ACCOUNT NO.:  [id number]   MEDICAL RECORD NO.:  [id number]          PATIENT TYPE:  OBV   LOCATION:  6526                         FACILITY:  [facility]   PHYSICIAN:  Cassell Clement, M.D. DATE OF BIRTH:  18-Dec-1940   DATE OF ADMISSION:  11/15/2005  DATE OF DISCHARGE:                                HISTORY & PHYSICAL   CHIEF COMPLAINT:  Chest pressure.   HISTORY:  This is a 70 year old married Caucasian female admitted with chest  pressure and nausea.  She does have a past history of having had an acute  myocardial infarction in 1996 at which time Dr. Katrinka Blazing did a cardiac cath and  apparently found no significant coronary disease and she was told that her  heart attack had been due to some type of spasm.  She was placed on blood  thinners for awhile and was placed on Cardizem for relief of coronary spasm  and has done well since then and has remained on Cardizem since then.  She  has had no subsequent ischemic workup.  She does have a history of elevated  triglycerides and takes fish oil and herbal remedies.  She has felt unusual  fatigue for the past several weeks.  Today at work, she noted increased  fatigue and then developed some substernal pressure with radiation to the  right shoulder.  She was not dyspneic and not diaphoretic but did have  nausea.  Her coworkers insisted that she come to the emergency room and  actually drove her here.   Her present medications include Cardizem CD 240 one daily, aspirin 81 mg  daily, multivitamin daily, calcium daily, fish oil capsules two a day,  vitamin C daily, zinc daily, Tylenol p.r.n., Astelin nasal spray 1 spray in  each nostril twice a day for allergies, over-the-counter Claritin and she  gets a weekly allergy shot which she had earlier today.   ALLERGIES:  She is allergic to PENICILLIN and she does not tolerate ACE  INHIBITORS.   OPERATIONS:  She has had a history of hysterectomy in 1990.  She had a  subarachnoid hemorrhage in 1991 which resolved spontaneously.  She had  radical sinus surgery in 1998 by Dr. Brynda Peon.   FAMILY HISTORY:  Father died of colon cancer at age 5.  Mother died at 17  of a massive heart attack.   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  She  is an Astronomer. and she was at work today when this happened.  She does not  smoke.  She drinks wine about twice a year.  She is married and has two  grown children in good health.   Remainder of review of systems is negative in detail.  Of note is the  unexplained fatigue.   PHYSICAL EXAM:  VITAL SIGNS:  Blood pressure of 161/65, pulse of 63,  respirations are normal.  HEENT:  Negative.  Jugular venous pressure normal.  CHEST:  Clear.  HEART:  Reveals no murmur, gallop, rub or click.  ABDOMEN:  Soft and nontender.  EXTREMITIES:  Show good  peripheral pulses.  No edema.  NEUROLOGICAL:  Exam is physiologic.   Her electrocardiogram shows normal sinus rhythm and is within normal limits.  Labs include initial CK-MB and troponin-I which are normal.  Chest x-ray is  normal.   IMPRESSION:  1.  Chest pressure and nausea, uncertain etiology, rule out myocardial      infarction.  2.  History of prior myocardial infarction in 1996.   DISPOSITION:  In view of her past history of coronary artery disease and her  presentation today, we will admit her to observation status to Dr. Verdis Prime.  She will be on telemetry.  We will get serial enzymes.  We will hold  her breakfast in the morning to allow for possible Cardiolite stress test as  per Dr. Corky Sing decision in that regard.  We will continue Cardizem  but we will hold it in the morning prior to the stress test.           ______________________________  Cassell Clement, M.D.     TB/MEDQ  D:  11/16/2005  T:  11/16/2005  Job:  [id number]   cc:   Lyn Records, M.D.  Fax: [phone]   Tasia Catchings, M.D.  Fax: [phone]

## 2010-12-30 NOTE — Discharge Summary (Signed)
NAME:  Kristin [name], Kristin [name]              ACCOUNT NO.:  [id number]   MEDICAL RECORD NO.:  [id number]          PATIENT TYPE:  OBV   LOCATION:  6526                         FACILITY:  [facility]   PHYSICIAN:  Corky Crafts, MDDATE OF BIRTH:  01-31-1941   DATE OF ADMISSION:  11/15/2005  DATE OF DISCHARGE:  11/16/2005                                 DISCHARGE SUMMARY   ADMISSION DIAGNOSES:  Chest pain.   DISCHARGE DIAGNOSES:  1.  Chest pain, resolved.  2.  History of prior myocardial infarction.  3.  Dyslipidemia.   PROCEDURE:  None.   HOSPITAL COURSE:  Kristin [name] is a 70 year old female who was admitted to the  hospital last night with chest pressure and nausea. She has a past history  of an acute MI in 1996 with a cardiac catheterization and was told that the  MI was due to a spasm. Yesterday while at work, she noted increased fatigue,  substernal chest pressure which radiated to the right shoulder. She  experienced nausea without vomiting and denied dyspnea and diaphoresis. Her  coworkers brought her to the [facility] emergency room where she was  admitted. Her EKG revealed normal sinus rhythm with an incomplete right  bundle branch block and a ventricular rate of 75 beats per minute. Her point  and care and serial enzymes were negative. A stress Cardiolite was performed  today and revealed no evidence of ischemia. The patient has had no further  complaints of chest pain, and is being discharged to home today in stable  condition. Prior to discharge, the patient was advised to go home on a  Statin due to her history of MI; however, the patient refused to accept a  prescription. She indicated that she would rather manage her cholesterol  through diet and exercise.   LABORATORY DATA:  Sodium 140, potassium 3.6, chloride 106, CO2 29.6, glucose  103, BUN 14, creatinine 1.0, white blood count 7.3, hemoglobin 13.5,  hematocrit 39.3, platelets 221. Point of care cardiac markers x2  respectively, myoglobin 69.2 and 93.8 respectively, CK-MB 1.2 and 2.6  respectively and troponin I less than 0.05 and less than 0.05 respectively.  Serial cardiac markers x2, CK total 173 and 158 respectively. CK-MB 3.5 and  2.9 respectively, troponin I 0.01 and 0.02 respectively.   X-RAYS:  Chest x-ray 2-view November 15, 2005, no acute chest disease.   EKG, November 16, 2005, normal sinus rhythm with an incomplete right bundle  branch block with a ventricular rate of 60 beats per minute.   CONDITION ON DISCHARGE:  The patient has had no further complaints of angina  and is being discharged to home today in stable condition.   DISCHARGE MEDICATIONS:  1.  Cardizem CD 240 mg daily.  2.  Enteric coated aspirin 81 mg daily.  3.  Astelin nasal spray.  4.  Sublingual nitroglycerin 0.5 mg as needed (recent prescription from      PCP).  5.  Multivitamin daily.  6.  Calcium daily.  7.  Fish oil 2 tablets daily.  8.  Vitamin C daily.  9.  Zinc daily.  10. Tylenol as needed.  11. OTC Claritin.  12. Allergy shots as before current admission.   DISCHARGE INSTRUCTIONS:  1.  The patient should follow a heart healthy diet including low salt, low      fat, and low cholesterol.  2.  The patient should call her cardiologist with recurrent chest pain.   FOLLOW UP:  The patient should followup with her primary care physician, Dr.  Sherin Quarry in 2 weeks. She will also follow up with Dr. Katrinka Blazing.   Addendum:  I saw and exmained this patient on the day of her discharge and  performed the assessment and plan. -Everette Rank, MD      Tylene Fantasia, [place]      Corky Crafts, MD  Electronically Signed    RDM/MEDQ  D:  11/16/2005  T:  11/17/2005  Job:  [id number]   cc:   Lyn Records, M.D.  Fax: [phone]   Tasia Catchings, M.D.  Fax: [phone]

## 2011-03-15 LAB — HM MAMMOGRAPHY: HM Mammogram: NORMAL

## 2011-03-16 ENCOUNTER — Ambulatory Visit (INDEPENDENT_AMBULATORY_CARE_PROVIDER_SITE_OTHER): Payer: Medicare Other | Admitting: Obstetrics and Gynecology

## 2011-03-16 ENCOUNTER — Encounter: Payer: Self-pay | Admitting: Obstetrics and Gynecology

## 2011-03-16 VITALS — BP 124/70 | Ht 66.0 in | Wt 152.0 lb

## 2011-03-16 DIAGNOSIS — IMO0001 Reserved for inherently not codable concepts without codable children: Secondary | ICD-10-CM

## 2011-03-16 DIAGNOSIS — R350 Frequency of micturition: Secondary | ICD-10-CM

## 2011-03-16 DIAGNOSIS — Z780 Asymptomatic menopausal state: Secondary | ICD-10-CM

## 2011-03-16 DIAGNOSIS — N951 Menopausal and female climacteric states: Secondary | ICD-10-CM

## 2011-03-16 NOTE — Progress Notes (Signed)
The patient came back to see me today to discuss her hormone replacement therapy. We have been using a patch with her for a very long period of time. She is now 31 and wanted to know about benefit/risk ratio.her biggest reason for taking it was severe vasomotor symptoms have kept her up at night. She also was wondering about data on dementia. She does have significant osteoarthritis and was also concerned what happened to her joints if she stopped it. She has a rheumatologist in [place].she is having normal bone densities and normal mammograms.  ROS: All systems were completely reviewed. Only changes from previous visits or that she is on new blood pressure medications. She is on 2 new ones which are recorded and her blood pressures normal.  Physical examination: HEENT within normal limits. Neck: Thyroid not large. No masses. Supraclavicular nodes: not enlarged. Breasts: Examined in both sitting midline position. No skin changes and no masses. Abdomen: Soft no guarding rebound or masses or hernia. Pelvic: External: Within normal limits. BUS: Within normal limits. Vaginal:within normal limits. Good estrogen effect. No evidence of cystocele rectocele or enterocele. Cervix: clean. Uterus: Normal size and shape. Adnexa: No masses. Rectovaginal exam: Confirmatory and negative. Extremities: Within normal limits.  Assessment: Vasomotor symptoms, osteoarthritis. Atrophic vaginitis.  Plan: We had a very long 20-30 minute discussion about hormone replacement therapy. We discussed DVT pulmonary embolus, stroke and breast cancer. She does understand that using a patch and not on progesterone is very helpful in terms of reducing the above risk. We discussed that estrogen does sometimes help osteoarthritis. We discussed that there may be a slightly high-risk a dementia and older women take estrogen. She is can to try to stop her patch and see how she does. I told her I would support her she needs to continue her  patch. She will call for prescription if needed. She will also discuss all the above with a rheumatologist. I will see her in 2 years her she is no longer on estrogen. I will see her in one year if she remains on estrogen.

## 2011-04-10 ENCOUNTER — Ambulatory Visit: Payer: Self-pay | Admitting: Obstetrics and Gynecology

## 2011-04-12 ENCOUNTER — Encounter: Payer: Self-pay | Admitting: Family Medicine

## 2011-04-13 ENCOUNTER — Encounter: Payer: Self-pay | Admitting: *Deleted

## 2011-04-14 ENCOUNTER — Encounter: Payer: Self-pay | Admitting: Obstetrics and Gynecology

## 2011-04-21 ENCOUNTER — Other Ambulatory Visit: Payer: Self-pay | Admitting: Neurology

## 2011-04-21 DIAGNOSIS — IMO0001 Reserved for inherently not codable concepts without codable children: Secondary | ICD-10-CM

## 2011-04-21 DIAGNOSIS — R269 Unspecified abnormalities of gait and mobility: Secondary | ICD-10-CM

## 2011-04-21 DIAGNOSIS — R413 Other amnesia: Secondary | ICD-10-CM

## 2011-04-26 ENCOUNTER — Telehealth: Payer: Self-pay

## 2011-04-26 MED ORDER — LOSARTAN POTASSIUM 100 MG PO TABS: 100.0000 mg | ORAL_TABLET | Freq: Every day | ORAL | Status: DC

## 2011-04-26 NOTE — Telephone Encounter (Signed)
Needs a refill for cozaar 100 mg daily.

## 2011-04-27 ENCOUNTER — Ambulatory Visit
Admission: RE | Admit: 2011-04-27 | Discharge: 2011-04-27 | Disposition: A | Discharge status: Home / Self Care | Payer: Medicare Other | Source: Ambulatory Visit | Attending: Neurology | Admitting: Neurology

## 2011-04-27 DIAGNOSIS — R413 Other amnesia: Secondary | ICD-10-CM

## 2011-04-27 DIAGNOSIS — IMO0001 Reserved for inherently not codable concepts without codable children: Secondary | ICD-10-CM

## 2011-04-27 DIAGNOSIS — R269 Unspecified abnormalities of gait and mobility: Secondary | ICD-10-CM

## 2011-05-12 ENCOUNTER — Telehealth: Payer: Self-pay

## 2011-05-12 MED ORDER — HYDROCHLOROTHIAZIDE 25 MG PO TABS: 25.0000 mg | ORAL_TABLET | Freq: Every day | ORAL | Status: DC

## 2011-05-12 NOTE — Telephone Encounter (Signed)
Refill sent for hydrochlorothiazide 25 mg one tablet daily.

## 2011-05-26 LAB — DIFFERENTIAL
Basophils Absolute: 0.1
Basophils Relative: 1
Monocytes Absolute: 0.4
Neutro Abs: 5.1
Neutrophils Relative %: 73

## 2011-05-26 LAB — POCT I-STAT CREATININE: Creatinine, Ser: 1.1

## 2011-05-26 LAB — CBC
Hemoglobin: 13.6
MCHC: 34.4
Platelets: 247
RDW: 13.9

## 2011-05-26 LAB — I-STAT 8, (EC8 V) (CONVERTED LAB)
Acid-Base Excess: 3 — ABNORMAL HIGH
Bicarbonate: 27.6 — ABNORMAL HIGH
Chloride: 106
pCO2, Ven: 42.7 — ABNORMAL LOW
pH, Ven: 7.418 — ABNORMAL HIGH

## 2011-05-26 LAB — TSH: TSH: 1.569

## 2011-07-17 ENCOUNTER — Other Ambulatory Visit (INDEPENDENT_AMBULATORY_CARE_PROVIDER_SITE_OTHER): Payer: Medicare Other

## 2011-07-17 ENCOUNTER — Telehealth: Payer: Self-pay | Admitting: Family Medicine

## 2011-07-17 DIAGNOSIS — E785 Hyperlipidemia, unspecified: Secondary | ICD-10-CM

## 2011-07-17 DIAGNOSIS — I10 Essential (primary) hypertension: Secondary | ICD-10-CM

## 2011-07-17 LAB — COMPREHENSIVE METABOLIC PANEL
Albumin: 3.9 g/dL (ref 3.5–5.2)
BUN: 15 mg/dL (ref 6–23)
CO2: 30 mEq/L (ref 19–32)
GFR: 61.66 mL/min (ref >60.00)
Glucose, Bld: 87 mg/dL (ref 70–99)
Potassium: 3.6 mEq/L (ref 3.5–5.1)
Sodium: 142 mEq/L (ref 135–145)
Total Bilirubin: 0.6 mg/dL (ref 0.3–1.2)
Total Protein: 7.1 g/dL (ref 6.0–8.3)

## 2011-07-17 LAB — LIPID PANEL: Cholesterol: 218 mg/dL — ABNORMAL HIGH (ref 0–200)

## 2011-07-17 NOTE — Telephone Encounter (Signed)
Message copied by Excell Seltzer on Mon Jul 17, 2011 10:37 AM ------      Message from: Kristin [name]      Created: Tue Jul 11, 2011 10:19 AM      Regarding: Cpx labs Mon 12/3       Please order  future cpx labs for pt's upcomming lab appt.      Thanks      Rodney Booze

## 2011-07-20 ENCOUNTER — Encounter: Payer: Medicare Other | Admitting: Family Medicine

## 2011-07-21 ENCOUNTER — Telehealth: Payer: Self-pay | Admitting: *Deleted

## 2011-07-21 MED ORDER — ESTRADIOL 0.05 MG/24HR TD PTWK: 1.0000 | MEDICATED_PATCH | TRANSDERMAL | Status: DC

## 2011-07-21 NOTE — Telephone Encounter (Signed)
Patient called and wanted Estradiol 0.05 patch called in to pharmacy because of menopausal symptoms.  Rx sent in, per DG ov ok to send in.

## 2011-07-26 ENCOUNTER — Other Ambulatory Visit: Payer: Self-pay | Admitting: *Deleted

## 2011-07-26 MED ORDER — DILTIAZEM HCL ER 240 MG PO CP24: 240.0000 mg | ORAL_CAPSULE | Freq: Every day | ORAL | Status: DC

## 2011-07-28 ENCOUNTER — Encounter: Payer: Medicare Other | Admitting: Family Medicine

## 2011-08-10 ENCOUNTER — Encounter: Payer: Self-pay | Admitting: Family Medicine

## 2011-08-10 ENCOUNTER — Ambulatory Visit (INDEPENDENT_AMBULATORY_CARE_PROVIDER_SITE_OTHER): Payer: Medicare Other | Admitting: Family Medicine

## 2011-08-10 VITALS — BP 130/80 | HR 53 | Temp 97.9°F | Ht 66.0 in | Wt 155.4 lb

## 2011-08-10 DIAGNOSIS — R5382 Chronic fatigue, unspecified: Secondary | ICD-10-CM | POA: Insufficient documentation

## 2011-08-10 DIAGNOSIS — M1990 Unspecified osteoarthritis, unspecified site: Secondary | ICD-10-CM

## 2011-08-10 DIAGNOSIS — R42 Dizziness and giddiness: Secondary | ICD-10-CM | POA: Insufficient documentation

## 2011-08-10 DIAGNOSIS — Z79899 Other long term (current) drug therapy: Secondary | ICD-10-CM

## 2011-08-10 DIAGNOSIS — I10 Essential (primary) hypertension: Secondary | ICD-10-CM

## 2011-08-10 DIAGNOSIS — R5383 Other fatigue: Secondary | ICD-10-CM

## 2011-08-10 DIAGNOSIS — IMO0001 Reserved for inherently not codable concepts without codable children: Secondary | ICD-10-CM

## 2011-08-10 DIAGNOSIS — Z79890 Hormone replacement therapy (postmenopausal): Secondary | ICD-10-CM

## 2011-08-10 DIAGNOSIS — E785 Hyperlipidemia, unspecified: Secondary | ICD-10-CM

## 2011-08-10 DIAGNOSIS — Z0000 Encounter for general adult medical examination without abnormal findings: Secondary | ICD-10-CM

## 2011-08-10 DIAGNOSIS — G4733 Obstructive sleep apnea (adult) (pediatric): Secondary | ICD-10-CM

## 2011-08-10 NOTE — Assessment & Plan Note (Signed)
Inadequate control. Once BP medicaiton settled and feeling better.. We can consider trial of lipitor to reduce cholesterol and lower heart disease risk.

## 2011-08-10 NOTE — Assessment & Plan Note (Signed)
No orthostasis. Will eval with labs.  Pt feels due to BP meds.  She does have some bradycardia on diltiazem.

## 2011-08-10 NOTE — Progress Notes (Signed)
Subjective:    Patient ID: Kristin [name], female    DOB: 1940/11/21, 70 y.o.   MRN: [id number]  HPI I have personally reviewed the Medicare Annual Wellness questionnaire and have noted 1. The patient's medical and social history 2. Their use of alcohol, tobacco or illicit drugs 3. Their current medications and supplements 4. The patient's functional ability including ADL's, fall risks, home safety risks and hearing or visual             impairment. 5. Diet and physical activities 6. Evidence for depression or mood disorders The patients weight, height, BMI and visual acuity have been recorded in the chart I have made referrals, counseling and provided education to the patient based review of the above and I have provided the pt with a written personalized care plan for preventive services.  Hypertension:  Well controlled on losartan, diltiazem, HCTZ  Using medication without problems or lightheadedness:  She feels that losartan and HCTZ cause dizziness with standing and fatigue.  She feels SE have started since being on theses meds. SE to ACEI, clonidine and BBlockers in past. Chest pain with exertion:None Edema:None Short of breath:Mild with stairs, but stable Average home BPs:118-140/60-68 Other issues:  Elevated Cholesterol: LDL not at goal never started lipitor 10 mg daily as Dr. Mariah Milling requested because she is very hesitant about SE. Using medications without problems:None Muscle aches: None Diet compliance: Moderate Exercise: 3 days a week. 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 Mariah Milling.  .  Fibromyalgia/inflammatory arthritis: Per rheumatology Dr. Maude Leriche,   Good control on tramadol 50 mg daily, does make her somewhat sleepy. Plans to have me follow this from now on.  Also goes to acupuncturist.  HRT: with estradiol per Dr. Hosie Spangle. Has reviewed with him recently and has tried to stop patches but was not able to. She is on  minimal dose.  Has hoarseness, sinus pressure, dry eyes.. Constant and chronic. Saw eye MD..On artificial tears. Noted after starting losartan last year. Had eval with ENT after noted issues last year.. No sinus issues at that time.  Now told by ENT to just take nasal saline spray.. No other meds  Seeing Dr. Richardean Chimera for spleep apnea. Not using CPAP for now given sinus issues. Has follow up in 10/2010.     Review of Systems  Constitutional: Positive for fatigue. Negative for fever and unexpected weight change.  HENT: Negative for ear pain, congestion, sore throat, sneezing, trouble swallowing and sinus pressure.   Eyes: Negative for pain and itching.  Respiratory: Negative for cough, shortness of breath and wheezing.   Cardiovascular: Negative for chest pain, palpitations and leg swelling.  Gastrointestinal: Negative for nausea, abdominal pain, diarrhea, constipation and blood in stool.  Genitourinary: Negative for dysuria, hematuria, vaginal discharge, difficulty urinating and menstrual problem.  Skin: Negative for rash.  Neurological: Positive for dizziness. Negative for syncope, weakness, light-headedness, numbness and headaches.  Psychiatric/Behavioral: Negative for confusion and dysphoric mood. The patient is not nervous/anxious.        Objective:   Physical Exam  Constitutional: Vital signs are normal. She appears well-developed and well-nourished. She is cooperative.  Non-toxic appearance. She does not appear ill. No distress.       Fatigued appearing in NAD   HENT:  Head: Normocephalic.  Right Ear: Hearing, tympanic membrane, external ear and ear canal normal.  Left Ear: Hearing, tympanic membrane, external ear and ear canal normal.  Nose: Mucosal edema present.  Mouth/Throat: Uvula is midline, oropharynx is clear and moist and mucous membranes are normal.  Eyes: Conjunctivae, EOM and lids are normal. Pupils are equal, round, and reactive to light. No foreign bodies found.    Neck: Trachea normal and normal range of motion. Neck supple. Carotid bruit is not present. No mass and no thyromegaly present.  Cardiovascular: Normal rate, regular rhythm, S1 normal, S2 normal, normal heart sounds and intact distal pulses.  Exam reveals no gallop.   No murmur heard. Pulmonary/Chest: Effort normal and breath sounds normal. No respiratory distress. She has no wheezes. She has no rhonchi. She has no rales.  Abdominal: Soft. Normal appearance and bowel sounds are normal. She exhibits no distension, no fluid wave, no abdominal bruit and no mass. There is no hepatosplenomegaly. There is no tenderness. There is no rebound, no guarding and no CVA tenderness. No hernia.  Lymphadenopathy:    She has no cervical adenopathy.    She has no axillary adenopathy.  Neurological: She is alert. She has normal strength. No cranial nerve deficit or sensory deficit.  Skin: Skin is warm, dry and intact. No rash noted.       Multiple moles, no concerning skin lesions  Psychiatric: Her speech is normal and behavior is normal. Judgment normal. Her mood appears not anxious. Cognition and memory are normal. She does not exhibit a depressed mood.          Assessment & Plan:  AMW: The patient's preventative maintenance and recommended screening tests for an annual wellness exam were reviewed in full today. Brought up to date unless services declined.  Counselled on the importance of diet, exercise, and its role in overall health and mortality. The patient's FH and SH was reviewed, including their home life, tobacco status, and drug and alcohol status.   Vaccines: uptodate with TD, zoster, PNA,  Flu given Mammogram: 03/2011 nml DVE/pap: not indicated, given age and S/P vaginal hysterectomy for noncancer reason Colon:2011.. Nml, likely no repeat needed. DEXA: 02/2011 at [facility]YN nml. Has follow up in 7/20912 but plans on changing over care to me eventually.

## 2011-08-10 NOTE — Patient Instructions (Addendum)
Get back on track with exercise and continue low fat, low carb diet. Once BP medication settled and feeling better.. We can consider trial of lipitor to reduce cholesterol and lower heart disease risk. Wear compression hose for varicose veins and peripheral swelling.  Hold for now HCTZ, follow BP at home.. Call me if BP are very elevated. Follow HTN check in  2 weeks 30 min OV if available.

## 2011-08-10 NOTE — Assessment & Plan Note (Signed)
Will eval with labs.  MAy be multifactorial// pon tramadol, fibromyalgia, poorly treated sleep apnea.

## 2011-08-10 NOTE — Assessment & Plan Note (Signed)
No suggestion on orthostatic vitals of orthostasis. BP well controlled on current meds, but pt feels that losartan and HCTZ causing issues. Will hold HCTZ and follow up in 2 weeks to see if feeling better and for HTN check.

## 2011-08-10 NOTE — Assessment & Plan Note (Signed)
Well controlled on tramadol. No RF per rheum.  no further work up needed, plans to stop seeing rheum.

## 2011-08-10 NOTE — Assessment & Plan Note (Signed)
Per Dr. Richardean Chimera.. currently not well controlled because not tolerating CPAP. TOld to remain off for npow. HAs follow up nin 3./2012 with her.

## 2011-08-11 ENCOUNTER — Encounter: Payer: Medicare Other | Admitting: Family Medicine

## 2011-08-11 LAB — CBC WITH DIFFERENTIAL/PLATELET
Basophils Relative: 0.2 % (ref 0.0–3.0)
Eosinophils Absolute: 0.1 10*3/uL (ref 0.0–0.7)
Hemoglobin: 13.4 g/dL (ref 12.0–15.0)
Lymphs Abs: 1.6 10*3/uL (ref 0.7–4.0)
MCHC: 34 g/dL (ref 30.0–36.0)
MCV: 97.4 fl (ref 78.0–100.0)
Monocytes Absolute: 0.3 10*3/uL (ref 0.1–1.0)
Neutro Abs: 6.5 10*3/uL (ref 1.4–7.7)
RBC: 4.03 Mil/uL (ref 3.87–5.11)

## 2011-08-11 LAB — VITAMIN B12: Vitamin B-12: 663 pg/mL (ref 211–911)

## 2011-08-15 HISTORY — PX: CARDIAC CATHETERIZATION: SHX172

## 2011-08-15 HISTORY — PX: CT ABD WO/W & PELVIS WO CM: HXRAD295

## 2011-08-25 ENCOUNTER — Ambulatory Visit (INDEPENDENT_AMBULATORY_CARE_PROVIDER_SITE_OTHER): Payer: Medicare Other | Admitting: Family Medicine

## 2011-08-25 ENCOUNTER — Encounter: Payer: Self-pay | Admitting: Family Medicine

## 2011-08-25 VITALS — BP 120/62 | HR 77 | Temp 97.8°F | Ht 66.0 in | Wt 155.8 lb

## 2011-08-25 DIAGNOSIS — I10 Essential (primary) hypertension: Secondary | ICD-10-CM

## 2011-08-25 DIAGNOSIS — R42 Dizziness and giddiness: Secondary | ICD-10-CM

## 2011-08-25 DIAGNOSIS — R5381 Other malaise: Secondary | ICD-10-CM

## 2011-08-25 DIAGNOSIS — J309 Allergic rhinitis, unspecified: Secondary | ICD-10-CM

## 2011-08-25 DIAGNOSIS — R5383 Other fatigue: Secondary | ICD-10-CM

## 2011-08-25 MED ORDER — FLUTICASONE PROPIONATE 50 MCG/ACT NA SUSP: 2.0000 | Freq: Every day | NASAL | Status: DC

## 2011-08-25 MED ORDER — LOSARTAN POTASSIUM 50 MG PO TABS: 50.0000 mg | ORAL_TABLET | Freq: Every day | ORAL | Status: DC

## 2011-08-25 NOTE — Assessment & Plan Note (Signed)
Restart HCTZ as stopping x 2 weeks has not helped with symptoms.   Decrease losartan to see if feeling better. Follow BPs closely. May need to adjust meds.  Cannot increase diltiazem given borderline bradycardia.

## 2011-08-25 NOTE — Assessment & Plan Note (Signed)
Likely multifactorial. Encouraged her to start CPAP back for trial if we are able to improve nasal congestion.  Limit unisom as may be causing lingering somnolence during the day, also contributing to dry eyes. Lab eval nml.  Work on increasing exercise.   Will decrease losartan in attempt to decrease pt SE... ? Cough, clearing throat, nasal congestion, fatigue.

## 2011-08-25 NOTE — Progress Notes (Signed)
  Subjective:    Patient ID: Kristin [name], female    DOB: 1940/12/17, 71 y.o.   MRN: [id number]  HPI  71 year old female  with multiple medical issues including fibromyalgia, sleep apnea (untreated currently) presents for follow up with dizziness, fatigue.  She reports she feels no better after holding HCTZ x 2 weeks. He blood pressure remains well controlled on losartan alone. She continues to feel like some symptms started after beginning losartan.  She feels that losartan causes pressure in sinus pressure.  Not using CPAP because of pressure. Has follow up appt with Dr. Richardean Chimera in may to discuss CPAP.   Lab eval showed nml thyroid,nml CMET,  B12, vit D.  Fatigue felt likely multifactorial from untreated sleep apnea, meds SE (tramadol), fibromyalgia etc.      Review of Systems  Constitutional: Positive for fatigue. Negative for fever.  HENT: Positive for congestion, rhinorrhea and postnasal drip. Negative for ear pain.   Eyes: Negative for pain.       Dry eyes  Respiratory: Positive for cough. Negative for shortness of breath.        Clearing throat  Cardiovascular: Negative for chest pain and palpitations.  Gastrointestinal: Negative for nausea, abdominal pain and diarrhea.  Skin: Negative for rash.       Objective:   Physical Exam  Constitutional: Vital signs are normal. She appears well-developed and well-nourished. She is cooperative.  Non-toxic appearance. She does not appear ill. No distress.  HENT:  Head: Normocephalic.  Right Ear: Hearing, tympanic membrane, external ear and ear canal normal. Tympanic membrane is not erythematous, not retracted and not bulging.  Left Ear: Hearing, tympanic membrane, external ear and ear canal normal. Tympanic membrane is not erythematous, not retracted and not bulging.  Nose: Mucosal edema present. No rhinorrhea. Right sinus exhibits no maxillary sinus tenderness and no frontal sinus tenderness. Left sinus exhibits no maxillary  sinus tenderness and no frontal sinus tenderness.  Mouth/Throat: Uvula is midline, oropharynx is clear and moist and mucous membranes are normal.  Eyes: Conjunctivae, EOM and lids are normal. Pupils are equal, round, and reactive to light. No foreign bodies found.       Frequent blinking.  Neck: Trachea normal and normal range of motion. Neck supple. Carotid bruit is not present. No mass and no thyromegaly present.  Cardiovascular: Normal rate, regular rhythm, S1 normal, S2 normal, normal heart sounds, intact distal pulses and normal pulses.  Exam reveals no gallop and no friction rub.   No murmur heard. Pulmonary/Chest: Effort normal and breath sounds normal. Not tachypneic. No respiratory distress. She has no decreased breath sounds. She has no wheezes. She has no rhonchi. She has no rales.  Abdominal: Soft. Normal appearance and bowel sounds are normal. There is no tenderness.  Neurological: She is alert.  Skin: Skin is warm, dry and intact. No rash noted.  Psychiatric: Her speech is normal and behavior is normal. Judgment and thought content normal. Her mood appears not anxious. Cognition and memory are normal. She does not exhibit a depressed mood.          Assessment & Plan:

## 2011-08-25 NOTE — Assessment & Plan Note (Signed)
Limiting her from using CPAP. Trial of nasal steroid spray.

## 2011-08-25 NOTE — Patient Instructions (Addendum)
Add back HCTZ.. Trial of losartan 50 mg daily (half dose). Start nasal steroid fluticasone 2 spray per nostril daily. Try to re-attempt using CPAP.[name] After 1 week at least of flonase. Likely help with fatigue. Limit unisom.  Follow BP at home.. And call if running consistently above 140/90. Call with how you a feeling in next 2 weeks.

## 2011-09-21 ENCOUNTER — Telehealth: Payer: Self-pay | Admitting: *Deleted

## 2011-09-21 NOTE — Telephone Encounter (Signed)
That sounds very good, can you update med list. I would keep doing that

## 2011-09-21 NOTE — Telephone Encounter (Signed)
Medication updated and patient advised

## 2011-09-21 NOTE — Telephone Encounter (Signed)
Patient is feeling better with the 1/2 tablet losartan bid. B/P running 120 or 130 over about 64. Please advise on what to do.

## 2011-10-11 ENCOUNTER — Telehealth: Payer: Self-pay | Admitting: *Deleted

## 2011-10-11 ENCOUNTER — Encounter: Payer: Self-pay | Admitting: Family Medicine

## 2011-10-11 ENCOUNTER — Ambulatory Visit (INDEPENDENT_AMBULATORY_CARE_PROVIDER_SITE_OTHER): Payer: Medicare Other | Admitting: Family Medicine

## 2011-10-11 ENCOUNTER — Ambulatory Visit (INDEPENDENT_AMBULATORY_CARE_PROVIDER_SITE_OTHER)
Admission: RE | Admit: 2011-10-11 | Discharge: 2011-10-11 | Disposition: A | Discharge status: Home / Self Care | Payer: Medicare Other | Source: Ambulatory Visit | Attending: Family Medicine | Admitting: Family Medicine

## 2011-10-11 VITALS — BP 130/72 | HR 76 | Temp 98.4°F | Wt 152.5 lb

## 2011-10-11 DIAGNOSIS — B0229 Other postherpetic nervous system involvement: Secondary | ICD-10-CM | POA: Insufficient documentation

## 2011-10-11 DIAGNOSIS — E876 Hypokalemia: Secondary | ICD-10-CM

## 2011-10-11 DIAGNOSIS — R110 Nausea: Secondary | ICD-10-CM

## 2011-10-11 DIAGNOSIS — R1032 Left lower quadrant pain: Secondary | ICD-10-CM

## 2011-10-11 LAB — CBC WITH DIFFERENTIAL/PLATELET
Basophils Absolute: 0 10*3/uL (ref 0.0–0.1)
Eosinophils Relative: 0.7 % (ref 0.0–5.0)
HCT: 40 % (ref 36.0–46.0)
Lymphs Abs: 1.5 10*3/uL (ref 0.7–4.0)
MCV: 98.2 fl (ref 78.0–100.0)
Monocytes Absolute: 0.6 10*3/uL (ref 0.1–1.0)
Monocytes Relative: 7.1 % (ref 3.0–12.0)
Neutrophils Relative %: 73.5 % (ref 43.0–77.0)
Platelets: 251 10*3/uL (ref 150.0–400.0)
RDW: 14.3 % (ref 11.5–14.6)
WBC: 8.1 10*3/uL (ref 4.5–10.5)

## 2011-10-11 LAB — COMPREHENSIVE METABOLIC PANEL
ALT: 20 U/L (ref 0–35)
Albumin: 4.2 g/dL (ref 3.5–5.2)
Alkaline Phosphatase: 43 U/L (ref 39–117)
Potassium: 3.1 mEq/L — ABNORMAL LOW (ref 3.5–5.1)
Sodium: 137 mEq/L (ref 135–145)
Total Bilirubin: 0.4 mg/dL (ref 0.3–1.2)
Total Protein: 7.2 g/dL (ref 6.0–8.3)

## 2011-10-11 LAB — POCT URINALYSIS DIPSTICK
Bilirubin, UA: NEGATIVE
Glucose, UA: NEGATIVE
Leukocytes, UA: NEGATIVE
Spec Grav, UA: 1.015

## 2011-10-11 MED ORDER — PROMETHAZINE HCL 25 MG/ML IJ SOLN
25.0000 mg | Freq: Once | INTRAMUSCULAR | Status: AC
  Administered 2011-10-11: 25 mg via INTRAMUSCULAR

## 2011-10-11 MED ORDER — IOHEXOL 300 MG/ML  SOLN
100.0000 mL | Freq: Once | INTRAMUSCULAR | Status: AC | PRN
  Administered 2011-10-11: 100 mL via INTRAVENOUS

## 2011-10-11 MED ORDER — POTASSIUM CHLORIDE CRYS ER 20 MEQ PO TBCR: 20.0000 meq | EXTENDED_RELEASE_TABLET | Freq: Every day | ORAL | Status: DC

## 2011-10-11 MED ORDER — CIPROFLOXACIN HCL 500 MG PO TABS: 500.0000 mg | ORAL_TABLET | Freq: Two times a day (BID) | ORAL | Status: DC

## 2011-10-11 MED ORDER — NAPROXEN 500 MG PO TABS: ORAL_TABLET | ORAL | Status: DC

## 2011-10-11 MED ORDER — PROMETHAZINE HCL 25 MG PO TABS: 25.0000 mg | ORAL_TABLET | Freq: Three times a day (TID) | ORAL | Status: DC | PRN

## 2011-10-11 NOTE — Telephone Encounter (Signed)
Rose called stating that patient just had a CT of the abdomen and pelvis and she is in a lot of pain.  Patient is requesting pain medication.  I advised Rose to let patient know that Dr. Sharen Hones is out of the office this afternoon and will not return until tomorrow morning.  I will send the message to another provider in the office but their is not guarantee that they will agree to prescribe anything.  Please advise.

## 2011-10-11 NOTE — Progress Notes (Signed)
Subjective:    Patient ID: Kristin [name], female    DOB: [birth year], 71 y.o.   MRN: [id number]  HPI CC: LLQ pain  5-6d h/o feeling pain LLQ radiating to back described as sharp screwdriver stabbing".  Initially started in hip.  Now staying nauseated for last 4 days and has decreased appetite.  Normal BM but somewhat more constipated recently.  H/o [name].    Staying cold.  No vomiting.  No fevers.  No diarrhea or blood in stool.  No mucous in stool.  Husband drove her today.  Does have fmhx GI problems but unsure what kind.  Last colonoscopy - [place] with Dr. Benard Rink, thinks 2011.  Told diverticulosis.  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  Medications and allergies reviewed and updated in chart.  Past histories reviewed and updated if relevant as below. Patient Active Problem List  Diagnoses  . HYPERLIPIDEMIA  . OBSTRUCTIVE SLEEP APNEA  . HYPERTENSION  . ALLERGIC RHINITIS  . ACNE, ROSACEA  . OSTEOARTHRITIS  . DEGENERATIVE DISC DISEASE, LUMBAR SPINE  . FIBROMYALGIA  . MYOCARDIAL INFARCTION, HX OF  . Osteoarthritis  . Postmenopausal HRT (hormone replacement therapy)  . Fatigue  . Dizziness   Past Medical History  Diagnosis Date  . Rosacea   . Allergic rhinitis, cause unspecified   . Degeneration of lumbar or lumbosacral intervertebral disc   . Myalgia and myositis, unspecified   . Other and unspecified hyperlipidemia   . Unspecified essential hypertension   . Old myocardial infarction 1996  . Obstructive sleep apnea (adult) (pediatric)   . Osteoarthrosis, unspecified whether generalized or localized, unspecified site   . Abdominal pain, right upper quadrant   . Rash and other nonspecific skin eruption   . Endometriosis   . Ovarian cyst   . PMB (postmenopausal bleeding)    Past Surgical History  Procedure Date  . Cardiac catheterization 1996    vasospams; no blockage  . Ethmoidectomy 1998  . Sinus biopsy 1998   inverted papiloma  . Vaginal hysterectomy 1992    for mennorhagia   History  Substance Use Topics  . Smoking status: Never Smoker   . Smokeless tobacco: Never Used  . Alcohol Use: Yes     rare    Family History  Problem Relation Age of Onset  . Colon cancer Father   . Heart attack Mother   . Coronary artery disease Mother   . Osteoporosis Mother   . Heart disease Mother   . Heart failure Mother   . GER disease Sister   . Hypertension Sister   . Osteopenia Sister   . Breast cancer Paternal Aunt   . Heart failure Maternal Grandmother   . Heart failure Maternal Grandfather    Allergies  Allergen Reactions  . Ace Inhibitors     REACTION: Boils  . Angiotensin Receptor Blockers   . Latex     REACTION: Rash, blisters, breathing probs.  . Penicillins     REACTION: Hives, swellling   Current Outpatient Prescriptions on File Prior to Visit  Medication Sig Dispense Refill  . acetaminophen (TYLENOL) 325 MG tablet Take 650 mg by mouth every 6 (six) hours as needed.        [name] aspirin 81 MG EC tablet Take 81 mg by mouth daily.        . Calcium Carbonate (CALTRATE 600) 1500 MG TABS Take 1 tablet by mouth 2 (two) times daily.        [name]  Coenzyme Q10 200 MG capsule Take 200 mg by mouth daily.        [name] diltiazem (DILACOR XR) 240 MG 24 hr capsule Take 1 capsule (240 mg total) by mouth daily.  30 capsule  11  . Doxylamine Succinate, Sleep, (UNISOM) 25 MG tablet Take 12.5 mg by mouth at bedtime as needed.        [name] estradiol (CLIMARA) 0.05 mg/24hr Place 1 patch (0.05 mg total) onto the skin once a week.  4 patch  8  . fish oil-omega-3 fatty acids 1000 MG capsule Take 2 g by mouth daily.        . hydrochlorothiazide (HYDRODIURIL) 25 MG tablet Take 25 mg by mouth daily.      [name] losartan (COZAAR) 50 MG tablet Take 1 tablet (50 mg total) by mouth daily.  30 tablet  11  . metroNIDAZOLE (METROGEL) 0.75 % gel Apply 1 application topically 2 (two) times daily as needed.        . Multiple Vitamin  (MULTIVITAMIN) tablet Take 1 tablet by mouth daily.        Bertram Gala Glycol-Propyl Glycol (SYSTANE) 0.4-0.3 % SOLN Apply to eye as directed.        . Probiotic Product (PROBIOTIC PO) Take 1 capsule by mouth daily.        . Thiamine HCl (VITAMIN B-1) 250 MG tablet Take 250 mg by mouth daily.        . traMADol (ULTRAM) 50 MG tablet Take 50 mg by mouth every 8 (eight) hours as needed.        . triamcinolone (KENALOG) 0.5 % cream Apply 1 application topically 2 (two) times daily.        [name] atorvastatin (LIPITOR) 10 MG tablet Take 10 mg by mouth daily.        . fluticasone (FLONASE) 50 MCG/ACT nasal spray Place 2 sprays into the nose daily.  1 g  11  . nitroGLYCERIN (NITROSTAT) 0.4 MG SL tablet Place 0.4 mg under the tongue every 5 (five) minutes as needed.          Review of Systems Per HPI    Objective:   Physical Exam  Nursing note and vitals reviewed. Constitutional: She appears well-developed and well-nourished. No distress.  HENT:  Head: Normocephalic and atraumatic.  Mouth/Throat: Oropharynx is clear and moist. No oropharyngeal exudate.  Neck: Normal range of motion. Neck supple.  Cardiovascular: Normal rate, regular rhythm, normal heart sounds and intact distal pulses.   No murmur heard. Pulmonary/Chest: Effort normal and breath sounds normal. No respiratory distress. She has no wheezes. She has no rales.  Abdominal: Soft. Bowel sounds are normal. She exhibits no distension and no mass. There is tenderness (mild to deep palpation) in the suprapubic area and left lower quadrant. There is CVA tenderness (minimal on left). There is no rebound and no guarding.  Musculoskeletal: She exhibits no edema.  Skin: Skin is warm and dry. No rash noted.  Psychiatric: She has a normal mood and affect.       Assessment & Plan:

## 2011-10-11 NOTE — Patient Instructions (Signed)
This is either diverticulitis or kidney stone. Pass by Kristin [name]'s office for ct scan today (call report). We will call you with results. I'm sorry you're not feeling well. Blood work today.

## 2011-10-11 NOTE — Assessment & Plan Note (Signed)
Of 4-5 d duration, worsening.  Associated with nausea and now hematuria although micro negative for RBC/hpf. H/o diverticulosis by colonoscopy 2011 per pt.  No h/o kidney stones. Given age and unclear etiology, will order CT scan w/ w/o for today to eval for both. Leaning towards diverticulitis with hematuria on dip false positive. Will treat according to results of CT. Phenergan 25mg  IM for nausea.

## 2011-10-11 NOTE — Telephone Encounter (Signed)
Check with Dr. Reece Agar first

## 2011-10-11 NOTE — Telephone Encounter (Signed)
Called pt at cell [phone]. Left message.  Will try and call later.

## 2011-10-11 NOTE — Telephone Encounter (Signed)
Addended by: Eustaquio Boyden on: 10/11/2011 05:20 PM   Modules accepted: Orders

## 2011-10-11 NOTE — Telephone Encounter (Signed)
Spoke with Dr. Reece Agar and advised him of CT results.  He said he would call the patient and will take care of prescribing any pain meds.

## 2011-10-11 NOTE — Telephone Encounter (Addendum)
Spoke with pt - discussed no evidence of diverticulitis or nephrolithiasis on CT - however given story will treat as diverticulitis with cipro x 7 days as well as naprosyn for inflammation and phenergan for nausea. Please call her tomorrow to schedule appt Friday morning with myself for recheck (ok to place 12:15pm).

## 2011-10-11 NOTE — Telephone Encounter (Signed)
Still not able to get a hold of pt.   I want to touch base with her re CT scan results - overall nonacute but want to treat as possible diverticulitis regardless given story with cipro and pain meds. I want her to f/u with [place] in 2 days (friday for recheck).

## 2011-10-12 NOTE — Telephone Encounter (Signed)
Appt scheduled with patient

## 2011-10-13 ENCOUNTER — Encounter: Payer: Self-pay | Admitting: Family Medicine

## 2011-10-13 ENCOUNTER — Ambulatory Visit (INDEPENDENT_AMBULATORY_CARE_PROVIDER_SITE_OTHER): Payer: Medicare Other | Admitting: Family Medicine

## 2011-10-13 DIAGNOSIS — R319 Hematuria, unspecified: Secondary | ICD-10-CM

## 2011-10-13 DIAGNOSIS — R1032 Left lower quadrant pain: Secondary | ICD-10-CM

## 2011-10-13 LAB — POCT URINALYSIS DIPSTICK
Bilirubin, UA: NEGATIVE
Blood, UA: NEGATIVE
Ketones, UA: NEGATIVE
Protein, UA: NEGATIVE
Spec Grav, UA: 1.015
pH, UA: 6

## 2011-10-13 NOTE — Assessment & Plan Note (Addendum)
This is slowly improving. CT reassuringly negative for kidney stone and for diverticulitis. Given story, would be cautious and regardless complete course of cipro for diverticulitis. Continue NSAIDs and phenergan for nausea.  Update [place] if worsening over weekend or next week. Would just watch hepatic cysts for now. Hematuria anticipate false positive - recheck today = normal. ??how much coming as referred pain from from lumbar spine in h/o DDD.  Discussed this.  overall reassured.

## 2011-10-13 NOTE — Patient Instructions (Signed)
I'm glad things are doing some better. Continue meds as up to now. Update [place] if not improving as expected. I wonder if this is all coming from back.

## 2011-10-13 NOTE — Progress Notes (Signed)
  Subjective:    Patient ID: Kristin [name], female    DOB: [birth year], 71 y.o.   MRN: [id number]  HPI CC: 2 d f/u  Seen here Wednesday with LLQ pain and hematuria (but negative RBC/hpf).  Blood work and CT scan reviewed with pt - large hepatic cysts and small renal cyst, nothing acute.  Some improvement last few days - naprosyn dulls pain, tramadol only takes edge off.  Improved nausea with phenergan.  No fevers/chills.  Passing gas fine but no BM in last 3 days (has eaten less, changed to liquid diet for 2 days then yesterday had soft food).    No shooting pain or numbness down legs or weakness.  Denies inciting injury.  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   H/o hysterectomy with BSO.  Pt is ex ostomy and wound RN.  Past Medical History  Diagnosis Date  . Rosacea   . Allergic rhinitis, cause unspecified   . Degeneration of lumbar or lumbosacral intervertebral disc   . Myalgia and myositis, unspecified   . Other and unspecified hyperlipidemia   . Unspecified essential hypertension   . Old myocardial infarction 1996  . Obstructive sleep apnea (adult) (pediatric)   . Osteoarthrosis, unspecified whether generalized or localized, unspecified site   . Abdominal pain, right upper quadrant   . Rash and other nonspecific skin eruption   . Endometriosis   . Ovarian cyst   . PMB (postmenopausal bleeding)    Past Surgical History  Procedure Date  . Cardiac catheterization 1996    vasospams; no blockage  . Ethmoidectomy 1998  . Sinus biopsy 1998    inverted papiloma  . Vaginal hysterectomy 1992    for mennorhagia   Review of Systems Per HPI    Objective:   Physical Exam  Nursing note and vitals reviewed. Constitutional: She appears well-developed and well-nourished. No distress.  HENT:  Head: Normocephalic and atraumatic.  Mouth/Throat: Oropharynx is clear and moist. No oropharyngeal exudate.  Eyes: Conjunctivae and EOM are normal. Pupils are equal, round, and reactive to  light. No scleral icterus.  Cardiovascular: Normal rate, regular rhythm, normal heart sounds and intact distal pulses.   No murmur heard. Pulmonary/Chest: Effort normal and breath sounds normal. No respiratory distress. She has no wheezes. She has no rales.  Abdominal: Soft. Bowel sounds are normal. She exhibits no distension. There is tenderness (mild suprapubic and RLQ). There is no rebound and no guarding.  Musculoskeletal:       tenderness to palpation lower lumbar region midspine No paraspinous mm tenderness/tightness No pain at SIJ, GTB or sciatic notch bilaterally. Neg SLR bilaterally, no pain with int/ext rotation at hip, no pain with FABER bilaterally.  Neurological: She has normal strength. No sensory deficit.       Diminished reflexes bilateral LE       Assessment & Plan:

## 2011-10-16 ENCOUNTER — Telehealth: Payer: Self-pay | Admitting: Family Medicine

## 2011-10-16 NOTE — Telephone Encounter (Addendum)
Spoke with pt on Saturday - shingles rash outbreak several days after initial pain.  Sent in valtrex to treat shingles outbreak, advised to stop cipro. To pcp as fyi.

## 2011-10-16 NOTE — Telephone Encounter (Signed)
Triage Record Num: [id number] Operator: Patriciaann Clan Patient Name: Kristin [name] Call Date & Time: 10/14/2011 10:22:07AM Patient Phone: [phone] PCP: Eustaquio Boyden Patient Gender: Female PCP Fax : [phone] Patient DOB: [birth year] Practice Name: [facility] Reason for Call: Caller: [name]/Patient; PCP: Eustaquio Boyden; CB#: [phone]; Call regarding Rash/Hives; Patient states she was seen in office for burning pain in lower back and abdomen 10/10/11. States she had labwork, KUB and CAT scan of abdomen. States blood was found in urine and she was prescribed Cipro, Naprosyn and Phenergan. Patient states she developed raised, red, rash with blisters on left lower back radiating to mid abdomen 10/14/11. Afebrile. Triage per Skin Lesions Protocol. No emergent sx identified. Care advice given per guidelines. Call back parameters reviewed. 1037: Dr. Eustaquio Boyden notified of above. Orders received: Patient to discontinue Cipro. Call in Rx for Valtrex 1 Gm. TID X 7 days. MD states he will call patient when finished in clinic. Above Rx with no refills called into [facility] in [place] @ [phone]. Spoke with Pharmacist/Sherry. Patient informed of above. Call back parameters reviewed. Patient verbalizes understanding. Protocol(s) Used: Skin Lesions Recommended Outcome per Protocol: See Provider within 24 hours Reason for Outcome: Painful lesion(s) unrelieved with home care measures Care Advice: Apply a warm or cool compress, or a cloth-covered ice pack, whichever feels better, to the area for 20 minutes every 2-3 hours while awake to relieve discomfort. ~ ~ Keep any pressure off of the affected area, be careful to avoid injury to the area. ~ SYMPTOM / CONDITION MANAGEMENT See a provider within 4 hours if signs of local infection occur (such as redness, warmth, tenderness, swelling; may have red streaks or purulent drainage) ~ 10/14/2011 10:50:30AM  Page 1 of 1 [id number]

## 2011-10-17 ENCOUNTER — Telehealth: Payer: Self-pay | Admitting: *Deleted

## 2011-10-17 MED ORDER — PROMETHAZINE HCL 25 MG PO TABS: 25.0000 mg | ORAL_TABLET | Freq: Three times a day (TID) | ORAL | Status: DC | PRN

## 2011-10-17 NOTE — Telephone Encounter (Signed)
Advised hold naprosyn.   Ok to change phenergan to every 6 hours. Potassium - took 5 pills total.  Advised stop potassium. Ok to take tramadol as well.  Kim can we call tomorrow to set up lab visit for wed or thurs this week for potassium recheck?  Thanks.

## 2011-10-17 NOTE — Telephone Encounter (Signed)
Pt has been taking phenergan every 8 hours, potassium, naprosyn twice a day and valtrex for shingles twice a day since 2/27. She has had nausea for the past 2 days, which she thinks is coming from all the meds that she is taking..  She's asking if she can increase the phenergan to every 6 hours and is asking if she can use the triamcinolone on the more troubling lesions.  Please advise.

## 2011-10-17 NOTE — Telephone Encounter (Signed)
Patient advised.  Lab appt scheduled 10/18/11 at 2:30 pm.

## 2011-10-18 ENCOUNTER — Other Ambulatory Visit (INDEPENDENT_AMBULATORY_CARE_PROVIDER_SITE_OTHER): Payer: Medicare Other

## 2011-10-18 ENCOUNTER — Other Ambulatory Visit: Payer: Self-pay | Admitting: Family Medicine

## 2011-10-18 DIAGNOSIS — E876 Hypokalemia: Secondary | ICD-10-CM

## 2011-11-07 ENCOUNTER — Telehealth: Payer: Self-pay | Admitting: Family Medicine

## 2011-11-07 NOTE — Telephone Encounter (Signed)
Caller: Cynitha/Ptr; PCP: Eustaquio Boyden; CB#: [phone]; ; ; Call regarding Shingles;  Pt calling regarding Shingles dx on 2/27-have never resolved after taking Acyclovir and Predinsone. Affected areas-left trunk/hip area-blisteres about the size of peas that have not popped. Afebrile. Pt trx with cream she ordered online and 2 Ibuprofen prn which does "take the edge off of the pain". Emergent sxs of Skin Lesions r/o. Disp: 24 hrs. Appt made in Epic for 3/27 at 1115 with Dr. Rudene Re.

## 2011-11-07 NOTE — Telephone Encounter (Signed)
Noted thanks °

## 2011-11-08 ENCOUNTER — Ambulatory Visit (INDEPENDENT_AMBULATORY_CARE_PROVIDER_SITE_OTHER): Payer: Medicare Other | Admitting: Family Medicine

## 2011-11-08 ENCOUNTER — Encounter: Payer: Self-pay | Admitting: Family Medicine

## 2011-11-08 VITALS — BP 142/80 | HR 68 | Temp 98.0°F | Wt 154.5 lb

## 2011-11-08 DIAGNOSIS — B0229 Other postherpetic nervous system involvement: Secondary | ICD-10-CM

## 2011-11-08 MED ORDER — PREGABALIN 75 MG PO CAPS: 75.0000 mg | ORAL_CAPSULE | Freq: Two times a day (BID) | ORAL | Status: DC

## 2011-11-08 NOTE — Patient Instructions (Signed)
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 You are no longer contagious. Let us know if not helping.  Postherpetic Neuralgia Shingles is a painful disease. It is caused by the herpes zoster virus. This is the same virus which also causes chickenpox. It can affect the torso, limbs, or the face. For most people, shingles is a condition of rather sudden onset. Pain usually lasts about 1 month. In older patients, or patients with poor immune systems, a painful, long-standing (chronic) condition called postherpetic neuralgia can develop. This condition rarely happens before age 22. But at least 50% of people over 50 become affected following an attack of shingles. There is a natural tendency for this condition to improve over time with no treatment. Less than 5% of patients have pain that lasts for more than 1 year. DIAGNOSIS  Herpes is usually easily diagnosed on physical exam. Pain sometimes follows when the skin sores (lesions) have disappeared. It is called postherpetic neuralgia. That name simply means the pain that follows herpes. TREATMENT   Treating this condition may be difficult. Usually one of the tricyclic antidepressants, often amitriptyline, is the first line of treatment. There is evidence that the sooner these medications are given, the more likely they are to reduce pain.   Conventional analgesics, regional nerve blocks, and anticonvulsants have little benefit in most cases when used alone. Other tricyclic anti-depressants are used as a second option if the first antidepressant is unsuccessful.   Anticonvulsants, including carbamazepine, have been found to provide some added benefit when used with a tricyclic anti-depressant. This is especially for the stabbing type of pain similar to that of trigeminal neuralgia.   Chronic opioid therapy. This is a strong narcotic pain medication. It is used to treat pain that is resistant to other measures. The issues of dependency  and tolerance can be reduced with closely managed care.   Some cream treatments are applied locally to the affected area. They can help when used with other treatments. Their use may be difficult in the case of postherpetic trigeminal neuralgia. This is involved with the face. So the substances can irritate the eye and the skin around the eye. Examples of creams used include Capsaicin and lidocaine creams.   For shingles, antiviral therapies along with analgesics are recommended. Studies of the effect of anti-viral agents such as acyclovir on shingles have been done. They show improved rates of healing and decreased severity of sudden (acute) pain. Some observations suggest that nerve blocks during shingles infection will:   Reduce pain.   Shorten the acute episode.   Prevent the emergence of postherpetic neuralgia.  Viral medications used include Acyclovir (Zovirax), Valacyclovir, Famciclovir and a lysine diet. Document Released: 10/21/2002 Document Revised: 07/20/2011 Document Reviewed: 07/31/2005 [facility] Patient Information 2012 [place], [place].

## 2011-11-08 NOTE — Progress Notes (Signed)
  Subjective:    Patient ID: Kristin [name], female    DOB: 03-16-1941, 71 y.o.   MRN: [id number]  HPI CC: f/u shingles  Seen here 10/11/2011 with LLQ, dx with shingles after vesicular rash developed.  Treated with valtrex 7d course as well as using terrasil cream (herbal remedy) and phenergan for nausea.  Using no other creams.  Residual numbness/tingling and hypersensitive pain.  No new rash.  Tramadol takes edge off.  Also takes advil.  Has been on gabapentin in past, didn't agree with her.  Has never been on lyrica.  Review of Systems Per HPI    Objective:   Physical Exam  Nursing note and vitals reviewed. Constitutional: She appears well-developed and well-nourished. No distress.  Skin: Rash noted.          Hyperpigmented macules some scabbed and dried vesicles left upper lumbar distribution       Assessment & Plan:

## 2011-11-08 NOTE — Assessment & Plan Note (Signed)
Rash consistent with post- shingles, healing. No new lesions, no need to give another valtrex course. Recommend start lyrica.  Has not tolerated gabapentin in past. Discussed common side effects. Update [place] if not improving. Continue tramadol, advil, herbal cream as needed.

## 2011-12-13 ENCOUNTER — Other Ambulatory Visit: Payer: Self-pay | Admitting: *Deleted

## 2011-12-13 MED ORDER — HYDROCHLOROTHIAZIDE 25 MG PO TABS: 25.0000 mg | ORAL_TABLET | Freq: Every day | ORAL | Status: DC

## 2011-12-13 NOTE — Telephone Encounter (Signed)
Refilled Hydrochlorothiazide. 

## 2012-03-18 ENCOUNTER — Encounter: Payer: Medicare Other | Admitting: Obstetrics and Gynecology

## 2012-03-19 ENCOUNTER — Ambulatory Visit (INDEPENDENT_AMBULATORY_CARE_PROVIDER_SITE_OTHER): Payer: Medicare Other | Admitting: Obstetrics and Gynecology

## 2012-03-19 ENCOUNTER — Encounter: Payer: Self-pay | Admitting: Obstetrics and Gynecology

## 2012-03-19 VITALS — BP 124/78 | Ht 65.0 in | Wt 156.0 lb

## 2012-03-19 DIAGNOSIS — M899 Disorder of bone, unspecified: Secondary | ICD-10-CM

## 2012-03-19 DIAGNOSIS — M8580 Other specified disorders of bone density and structure, unspecified site: Secondary | ICD-10-CM

## 2012-03-19 DIAGNOSIS — N952 Postmenopausal atrophic vaginitis: Secondary | ICD-10-CM

## 2012-03-19 DIAGNOSIS — Z780 Asymptomatic menopausal state: Secondary | ICD-10-CM

## 2012-03-19 DIAGNOSIS — M949 Disorder of cartilage, unspecified: Secondary | ICD-10-CM

## 2012-03-19 DIAGNOSIS — B029 Zoster without complications: Secondary | ICD-10-CM | POA: Insufficient documentation

## 2012-03-19 MED ORDER — ESTRADIOL 0.05 MG/24HR TD PTWK: 1.0000 | MEDICATED_PATCH | TRANSDERMAL | Status: DC

## 2012-03-19 NOTE — Progress Notes (Signed)
Patient came to see me today for further followup. Several times she has tried to stop the patch but always reinitiates it. She is back on her patch now feeling well. She is having no vaginal bleeding. She is having no pelvic pain. She has never had cervical dysplasia. Her last Pap smear was 2011. She is up-to-date on mammograms but is due later this month and this will be scheduled. The patch also helps her with vaginal dryness. She sees a PCP who does her lab work. Her bone density showed minimal osteopenia with her worst T score of -1.2. It appears as she's not had a bone density since 2009 and she will schedule that as well as her mammogram.  ROS: 12 system review done. Pertinent positives above. Other positives include fibromyalgia, fatigue, hyperlipidemia, previous myocardial infarction, osteoarthritis and shingles episode.  HEENT: Within normal limits. Neck: No masses. Supraclavicular lymph nodes: Not enlarged. Breasts: Examined in both sitting and lying position. Symmetrical without skin changes or masses. Abdomen: Soft no masses guarding or rebound. No hernias. Pelvic: External within normal limits. BUS within normal limits. Vaginal examination shows good estrogen effect, no cystocele enterocele or rectocele. Cervix and uterus absent. Adnexa within normal limits. Rectovaginal confirmatory. Extremities within normal limits.  Assessment:#1. Menopausal symptoms #2. Atrophic vaginitis #3. Osteopenia  Plan: Continue yearly mammograms. Bone density. Continue estrogen patch-pros and cons discussed.The new Pap smear guidelines were discussed with the patient. No pap done.

## 2012-03-26 ENCOUNTER — Encounter: Payer: Self-pay | Admitting: *Deleted

## 2012-03-26 NOTE — Progress Notes (Signed)
Patient ID: Kristin [name], female   DOB: 05-30-1941, 71 y.o.   MRN: [id number] Kristin [name] from [facility] called requesting bone density order fax to [phone]. Left message on her vm this has been done, pt appointment on 03/27/12 @ 1:30 pm. Kristin [name] # [phone]

## 2012-03-27 ENCOUNTER — Ambulatory Visit: Payer: Self-pay | Admitting: Obstetrics and Gynecology

## 2012-03-28 ENCOUNTER — Encounter: Payer: Self-pay | Admitting: Obstetrics and Gynecology

## 2012-04-10 ENCOUNTER — Ambulatory Visit: Payer: Self-pay | Admitting: Obstetrics and Gynecology

## 2012-04-11 ENCOUNTER — Encounter: Payer: Self-pay | Admitting: Obstetrics and Gynecology

## 2012-04-16 ENCOUNTER — Other Ambulatory Visit: Payer: Self-pay

## 2012-04-16 MED ORDER — TRAMADOL HCL 50 MG PO TABS: 50.0000 mg | ORAL_TABLET | Freq: Three times a day (TID) | ORAL | Status: DC | PRN

## 2012-04-16 NOTE — Telephone Encounter (Signed)
Pt no longer sees Dr Kathi Ludwig and pt had discussed with Dr Ermalene Searing about taking over Tramadol refills. Pt request refill Tramadol to [facility].

## 2012-05-27 ENCOUNTER — Telehealth: Payer: Self-pay | Admitting: Family Medicine

## 2012-05-27 NOTE — Telephone Encounter (Signed)
Caller: [name]; Patient Name: Kristin [name]; PCP: Kerby Nora [facility]); Best Callback Phone Number: [phone] She has one week worth of Tramadol 50 mgs 1 PO TID prn and she is used to having 90 day supply called in by her Rheumatologist. Requesting 90 day supply since [place] over a [place] at a time. If this is not possible she would like to know and will go back to Rheumatologist ordering med. Please call in refill for next month and let her know if 90 days worth can be ordered.- she normally takes 1-2 /day to control Arthritis Sx.

## 2012-05-28 MED ORDER — TRAMADOL HCL 50 MG PO TABS: 50.0000 mg | ORAL_TABLET | Freq: Three times a day (TID) | ORAL | Status: DC | PRN

## 2012-05-28 NOTE — Telephone Encounter (Signed)
#   [phone] sent to [facility].  Spoke with patient, she says she sometimes has to take 3 a day.

## 2012-05-28 NOTE — Telephone Encounter (Signed)
Can refill at 90 day supply given it is for tramadol and not narcotic.

## 2012-05-29 ENCOUNTER — Other Ambulatory Visit: Payer: Self-pay

## 2012-05-29 ENCOUNTER — Ambulatory Visit (INDEPENDENT_AMBULATORY_CARE_PROVIDER_SITE_OTHER): Payer: Medicare Other

## 2012-05-29 ENCOUNTER — Telehealth: Payer: Self-pay | Admitting: Cardiovascular Disease

## 2012-05-29 VITALS — BP 110/60 | HR 74 | Ht 66.0 in | Wt 143.0 lb

## 2012-05-29 DIAGNOSIS — Z79899 Other long term (current) drug therapy: Secondary | ICD-10-CM

## 2012-05-29 DIAGNOSIS — I10 Essential (primary) hypertension: Secondary | ICD-10-CM

## 2012-05-29 DIAGNOSIS — G4733 Obstructive sleep apnea (adult) (pediatric): Secondary | ICD-10-CM

## 2012-05-29 DIAGNOSIS — R002 Palpitations: Secondary | ICD-10-CM

## 2012-05-29 NOTE — Progress Notes (Signed)
Pt here for EKG and BP check after c/o palpitations and low BP/Hr readings on home BP monitor She has been c/o dizziness over the past few days and has appt with Dr. Kirke Corin Monday 06/03/12 She has hx of "irregular heartbeat" (per pt) but says it has felt "more irregular lately" Dizziness worse upon standing Orthostatic BP's performed (see vitals) EKG performed Going to discuss with Dr. Mariah Milling

## 2012-05-29 NOTE — Telephone Encounter (Signed)
Pt was seen by Dr. Mariah Milling x 1 and was told to f/u PRN She is calling stating, "I think my cardiac medications need to be adjusted again". She c/o low HR's associated with dizziness. Says her HR has been more irregular than usual (has hx PVC's). She has no known dx atrial fib Taking losartan 25 mg daily, HCTZ 12.5 mg daily and diltiazem 120 mg daily. Thinks K may be low as well Has appt with Dr. Kirke Corin 06/03/12 I advised her to come in today at 2 pm for EKG, compare our BP cuff to her home monitor and we will also check BMP today.  Understanding verb.

## 2012-05-29 NOTE — Patient Instructions (Addendum)
Your physician has recommended you make the following change in your medication:  Stop Hydrochlorothiazide  Stay well hydrated Change positions slowly Keep appointment with Dr. Kirke Corin for 06/03/12

## 2012-05-29 NOTE — Telephone Encounter (Signed)
Pulse in running in the 40s and 50s, patient thinks this is too low and she has been dizzy.  Wants to speak with RN.

## 2012-05-30 LAB — BASIC METABOLIC PANEL
BUN/Creatinine Ratio: 23 (ref 11–26)
BUN: 16 mg/dL (ref 8–27)
CO2: 25 mmol/L (ref 19–28)
Creatinine, Ser: 0.71 mg/dL (ref 0.57–1.00)
GFR calc Af Amer: 99 mL/min/{1.73_m2} (ref >59)

## 2012-06-03 ENCOUNTER — Ambulatory Visit (INDEPENDENT_AMBULATORY_CARE_PROVIDER_SITE_OTHER): Payer: Medicare Other | Admitting: Cardiovascular Disease

## 2012-06-03 ENCOUNTER — Encounter: Payer: Self-pay | Admitting: Cardiovascular Disease

## 2012-06-03 VITALS — BP 142/62 | HR 72 | Ht 66.0 in | Wt 143.5 lb

## 2012-06-03 DIAGNOSIS — R0609 Other forms of dyspnea: Secondary | ICD-10-CM

## 2012-06-03 DIAGNOSIS — R0600 Dyspnea, unspecified: Secondary | ICD-10-CM

## 2012-06-03 DIAGNOSIS — I10 Essential (primary) hypertension: Secondary | ICD-10-CM

## 2012-06-03 DIAGNOSIS — R0789 Other chest pain: Secondary | ICD-10-CM

## 2012-06-03 DIAGNOSIS — I4949 Other premature depolarization: Secondary | ICD-10-CM

## 2012-06-03 DIAGNOSIS — I493 Ventricular premature depolarization: Secondary | ICD-10-CM | POA: Insufficient documentation

## 2012-06-03 NOTE — Patient Instructions (Addendum)
Your physician has requested that you have en exercise stress myoview. For further information please visit [email or web address] Please follow instruction sheet, as given.  Do not take Diltiazem the morning of stress test.   Your physician has requested that you have an echocardiogram. Echocardiography is a painless test that uses sound waves to create images of your heart. It provides your doctor with information about the size and shape of your heart and how well your heart's chambers and valves are working. This procedure takes approximately one hour. There are no restrictions for this procedure.  Follow up after tests.

## 2012-06-03 NOTE — Assessment & Plan Note (Signed)
The patient is having frequent PVCs which seems to be symptomatic with palpitations and dizziness. Some of her dizziness might be also due to orthostatic hypotension which improved after stopping hydrochlorothiazide. She is having increased dyspnea. I will request an echocardiogram for evaluation of this. I do not recommend switching to a beta blocker given her previous reported history of coronary spasm and myocardial infarction associated with that. I will not make any changes for now until after her cardiac workup is finished. Some of the PVCs might have been triggered by hypokalemia with a potassium of 3.3. Magnesium was 1.9. Her thyroid function in December of last year was normal.

## 2012-06-03 NOTE — Assessment & Plan Note (Signed)
The patient's chest pain is worrisome for angina although there are some atypical features. ECG showed frequent PVCs mostly in the form of ventricular bigeminy. Thus, I recommend further evaluation with a treadmill nuclear stress test. Given her frequent PVCs, a treadmill stress test alone would not be sufficient.

## 2012-06-03 NOTE — Progress Notes (Signed)
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 She was seen in 2011 by Dr. Mariah Milling.  Recently, she noticed an irregular heartbeats described as skipping in her heart with increased dizziness especially when she stands up suddenly. She checked her pulse few times and it was in the high 40s. She came for her nurses at last week on Friday and was noted to be orthostatic. Her labs showed hypokalemia with a potassium of 3.3. Thus, she was asked to stop taking hydrochlorothiazide. She feels slightly better today. She has been complaining of left-sided chest discomfort under her left breast described as a tightness feeling happening both with activity and with rest. She also noticed increased dyspnea. She denies orthopnea or PND. She had slight worsening of lower extremity edema.  Allergies  Allergen Reactions  . Ace Inhibitors     REACTION: Boils  . Angiotensin Receptor Blockers   . Latex     REACTION: Rash, blisters, breathing probs.  . Penicillins     REACTION: Hives, swellling     Current Outpatient Prescriptions on File Prior to Visit  Medication Sig Dispense Refill  . acetaminophen (TYLENOL) 325 MG tablet Take 325 mg by mouth every 6 (six) hours as needed.       . Calcium Carbonate (CALTRATE 600) 1500 MG TABS Take 1 tablet by mouth 2 (two) times daily.        . Coenzyme Q10 200 MG capsule Take 200 mg by mouth daily.        [name] diltiazem (DILACOR XR) 240 MG 24 hr capsule Take 1 capsule (240 mg total) by mouth daily.  30 capsule  11  . Doxylamine Succinate, Sleep, (UNISOM) 25 MG tablet Take 12.5 mg by mouth at bedtime as needed.        [name] estradiol (CLIMARA) 0.05 mg/24hr Place 1 patch (0.05 mg total) onto the skin once a week.  13 patch  4  . fish oil-omega-3 fatty acids 1000 MG  capsule Take 2 g by mouth daily.        [name] losartan (COZAAR) 50 MG tablet Take 25 mg by mouth daily.      . Multiple Vitamin (MULTIVITAMIN) tablet Take 1 tablet by mouth daily.        . nitroGLYCERIN (NITROSTAT) 0.4 MG SL tablet Place 0.4 mg under the tongue every 5 (five) minutes as needed.        . NON FORMULARY Tarrefil ointment      . Polyethyl Glycol-Propyl Glycol (SYSTANE) 0.4-0.3 % SOLN Apply to eye as directed.        . Probiotic Product (PROBIOTIC PO) Take 1 capsule by mouth daily.        . Thiamine HCl (VITAMIN B-1) 250 MG tablet Take 250 mg by mouth daily.        . traMADol (ULTRAM) 50 MG tablet Take 1 tablet (50 mg total) by mouth every 8 (eight) hours as needed.  270 tablet  0  . triamcinolone (KENALOG) 0.5 % cream Apply 1 application topically 2 (two) times daily.           Past Medical History  Diagnosis Date  . Rosacea   . Allergic rhinitis, cause unspecified   . Degeneration of lumbar or lumbosacral intervertebral disc   . Myalgia and myositis, unspecified   .  Other and unspecified hyperlipidemia   . Unspecified essential hypertension   . Old myocardial infarction 1996  . Obstructive sleep apnea (adult) (pediatric)   . Osteoarthrosis, unspecified whether generalized or localized, unspecified site   . Abdominal pain, right upper quadrant   . Rash and other nonspecific skin eruption   . Endometriosis   . Ovarian cyst   . PMB (postmenopausal bleeding)   . Shingles      Past Surgical History  Procedure Date  . Cardiac catheterization 1996    vasospams; no blockage  . Ethmoidectomy 1998  . Sinus biopsy 1998    inverted papiloma  . Vaginal hysterectomy 1992    for mennorhagia  . Ct abd wo/w & pelvis wo cm 2013    no acute process, + minimal diverticulosis and hepatic/renal cysts, ATH calcifications  . Oophorectomy     BSO     Family History  Problem Relation Age of Onset  . Colon cancer Father   . Heart attack Mother   . Coronary artery disease Mother     . Osteoporosis Mother   . Heart disease Mother   . Heart failure Mother   . GER disease Sister   . Hypertension Sister   . Osteopenia Sister   . Breast cancer Paternal Aunt     age 62  . Heart failure Maternal Grandmother   . Heart failure Maternal Grandfather      History   Social History  . Marital Status: Married    Spouse Name: N/A    Number of Children: N/A  . Years of Education: N/A   Occupational History  . 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 Smoking status: Never Smoker   . Smokeless tobacco: Never Used  . Alcohol Use: Yes     rare   . Drug Use: No  . Sexually Active: No   Other Topics Concern  . Not on file   Social History Narrative   No regular exercise-used to do [facility] will.. She is DNR, [place] Gully is husband Elenore Paddy , then son Aneta Mins       PHYSICAL EXAM   BP 142/62  Pulse 72  Ht 5\' 6"  (1.676 m)  Wt 143 lb 8 oz (65.091 kg)  BMI 23.16 kg/m2  LMP 08/14/1989 Constitutional: She is oriented to person, place, and time. She appears well-developed and well-nourished. No distress.  HENT: No nasal discharge.  Head: Normocephalic and atraumatic.  Eyes: Pupils are equal and round. Right eye exhibits no discharge. Left eye exhibits no discharge.  Neck: Normal range of motion. Neck supple. No JVD present. No thyromegaly present.  Cardiovascular: Normal rate, regular rhythm, normal heart sounds. Exam reveals no gallop and no friction rub. No murmur heard.  Pulmonary/Chest: Effort normal and breath sounds normal. No stridor. No respiratory distress. She has no wheezes. She has no rales. She exhibits no tenderness.  Abdominal: Soft. Bowel sounds are normal. She exhibits no distension. There is no tenderness. There is no rebound and no guarding.  Musculoskeletal: Normal range of motion. She exhibits no edema and no tenderness.  Neurological: She is alert and oriented to person, place, and time. Coordination  normal.  Skin: Skin is warm and dry. No rash noted. She is not diaphoretic. No erythema. No pallor.  Psychiatric: She has a normal mood and affect. Her behavior is normal. Judgment and thought content normal.     EKG: Sinus  Rhythm  -Frequent pvcs -ventricular trigeminy  ABNORMAL RHYTHM   ASSESSMENT AND PLAN

## 2012-06-10 ENCOUNTER — Telehealth: Payer: Self-pay | Admitting: Cardiovascular Disease

## 2012-06-10 ENCOUNTER — Ambulatory Visit: Payer: Self-pay | Admitting: Cardiovascular Disease

## 2012-06-10 DIAGNOSIS — R079 Chest pain, unspecified: Secondary | ICD-10-CM

## 2012-06-10 NOTE — Telephone Encounter (Signed)
I discussed the results of the treadmill nuclear stress test which was done today at [facility]. Overall, there was no significant ECG changes with exercise. There was a partially reversible anterior wall defect likely due to shifting breast attenuation. However, mild anterior wall ischemia could not be entirely excluded. Ejection fraction was normal. The patient overall feels better but continues to have palpitations and PVCs. I discussed different management options including proceeding with cardiac catheterization if she continues to be symptomatic. For now, she will increase her potassium intake. I also asked her to take magnesium supplements once daily. She is coming tomorrow for an echocardiogram and followup with me after that. I will reevaluate her symptoms and consider cardiac catheterization if needed.

## 2012-06-11 ENCOUNTER — Ambulatory Visit (INDEPENDENT_AMBULATORY_CARE_PROVIDER_SITE_OTHER): Payer: Medicare Other | Admitting: Cardiovascular Disease

## 2012-06-11 ENCOUNTER — Other Ambulatory Visit: Payer: Self-pay

## 2012-06-11 ENCOUNTER — Other Ambulatory Visit: Payer: Medicare Other

## 2012-06-11 ENCOUNTER — Other Ambulatory Visit (INDEPENDENT_AMBULATORY_CARE_PROVIDER_SITE_OTHER): Payer: Medicare Other

## 2012-06-11 VITALS — BP 140/80 | HR 61 | Ht 66.0 in | Wt 143.0 lb

## 2012-06-11 DIAGNOSIS — R0789 Other chest pain: Secondary | ICD-10-CM

## 2012-06-11 DIAGNOSIS — Z79899 Other long term (current) drug therapy: Secondary | ICD-10-CM

## 2012-06-11 DIAGNOSIS — I209 Angina pectoris, unspecified: Secondary | ICD-10-CM

## 2012-06-11 DIAGNOSIS — Z5181 Encounter for therapeutic drug level monitoring: Secondary | ICD-10-CM

## 2012-06-11 DIAGNOSIS — I252 Old myocardial infarction: Secondary | ICD-10-CM

## 2012-06-11 DIAGNOSIS — R42 Dizziness and giddiness: Secondary | ICD-10-CM

## 2012-06-11 DIAGNOSIS — R0600 Dyspnea, unspecified: Secondary | ICD-10-CM

## 2012-06-11 MED ORDER — NITROGLYCERIN 0.4 MG SL SUBL: 0.4000 mg | SUBLINGUAL_TABLET | SUBLINGUAL | Status: DC | PRN

## 2012-06-11 NOTE — Progress Notes (Signed)
Pt sdcheduled for echo this am. She checked in for this at [facility] and began to tell receptionist bow she had a "blackout spell" this morning and does not feel well.  She was brought back to exam room where EKG and other baseline vitals were obtained. She c/o feeling tight in chest as well as feeling "unsteady" EKG reviewed with Dr. Kirke Corin Dr. Kirke Corin in to assess pt.  "Schedule ppt for left heart cath on Thurs. 06/13/12.  V.O Dr. Adline Mango, RN  Pt informed.  I attempted to schedule cath but computer system at [facility] is down. I will call back to schedule.

## 2012-06-12 ENCOUNTER — Other Ambulatory Visit: Payer: Self-pay | Admitting: Cardiovascular Disease

## 2012-06-12 DIAGNOSIS — I252 Old myocardial infarction: Secondary | ICD-10-CM

## 2012-06-12 DIAGNOSIS — R42 Dizziness and giddiness: Secondary | ICD-10-CM

## 2012-06-12 DIAGNOSIS — Z79899 Other long term (current) drug therapy: Secondary | ICD-10-CM

## 2012-06-12 DIAGNOSIS — Z5181 Encounter for therapeutic drug level monitoring: Secondary | ICD-10-CM

## 2012-06-12 DIAGNOSIS — R0789 Other chest pain: Secondary | ICD-10-CM

## 2012-06-12 LAB — PROTIME-INR: INR: 1 (ref 0.9–1.1)

## 2012-06-12 NOTE — Addendum Note (Signed)
Addended by: Antonieta Iba on: 06/12/2012 12:34 AM   Modules accepted: Level of Service

## 2012-06-13 ENCOUNTER — Ambulatory Visit: Payer: Self-pay | Admitting: Cardiovascular Disease

## 2012-06-13 ENCOUNTER — Encounter: Payer: Self-pay | Admitting: Cardiovascular Disease

## 2012-06-13 DIAGNOSIS — I2510 Atherosclerotic heart disease of native coronary artery without angina pectoris: Secondary | ICD-10-CM

## 2012-06-14 ENCOUNTER — Encounter: Payer: Self-pay | Admitting: Cardiovascular Disease

## 2012-06-14 NOTE — Assessment & Plan Note (Signed)
The patient reports symptoms similar to her previous myocardial infarction. She had a syncopal episode this morning. Baseline ECG does not show any acute ischemic changes. She had a nuclear stress test done recently which showed a partially reversible anterior wall defect thought to be due to shifting breast attenuation. However, anterior wall ischemia could not be excluded. Due to her current symptoms and stress test findings, I recommend proceeding with emergent cardiac catheterization. This, benefits and alternatives were discussed with the patient.

## 2012-06-14 NOTE — Patient Instructions (Signed)

## 2012-06-14 NOTE — Progress Notes (Signed)
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 However, she reported having a syncopal episode this morning associated with chest pain and a feeling of "impending doom"similar to her symptoms before her myocardial infarction many years ago.    Allergies  Allergen Reactions  . Ace Inhibitors     REACTION: Boils  . Angiotensin Receptor Blockers   . Latex     REACTION: Rash, blisters, breathing probs.  . Penicillins     REACTION: Hives, swellling     Current Outpatient Prescriptions on File Prior to Visit  Medication Sig Dispense Refill  . acetaminophen (TYLENOL) 325 MG tablet Take 325 mg by mouth every 6 (six) hours as needed.       . Calcium Carbonate (CALTRATE 600) 1500 MG TABS Take 1 tablet by mouth 2 (two) times daily.        . Coenzyme Q10 200 MG capsule Take 200 mg by mouth daily.        [name] diltiazem (DILACOR XR) 240 MG 24 hr capsule Take 1 capsule (240 mg total) by mouth daily.  30 capsule  11  . Doxylamine Succinate, Sleep, (UNISOM) 25 MG tablet Take 12.5 mg by mouth at bedtime as needed.        [name] estradiol (CLIMARA) 0.05 mg/24hr Place 1 patch (0.05 mg total) onto the skin once a week.  13 patch  4  . fish oil-omega-3 fatty acids 1000 MG capsule Take 2 g by mouth daily.        [name] losartan (COZAAR) 50 MG tablet Take 25 mg by mouth daily.      . Multiple Vitamin (MULTIVITAMIN) tablet Take 1 tablet by mouth daily.        . NON FORMULARY Tarrefil ointment      . Polyethyl Glycol-Propyl Glycol (SYSTANE) 0.4-0.3 % SOLN Apply to eye as directed.        . Probiotic Product (PROBIOTIC PO) Take 1 capsule by mouth daily.        . Thiamine HCl (VITAMIN B-1) 250 MG tablet Take 250 mg by mouth daily.        . traMADol (ULTRAM) 50 MG tablet Take  1 tablet (50 mg total) by mouth every 8 (eight) hours as needed.  270 tablet  0  . triamcinolone (KENALOG) 0.5 % cream Apply 1 application topically 2 (two) times daily.        . nitroGLYCERIN (NITROSTAT) 0.4 MG SL tablet Place 1 tablet (0.4 mg total) under the tongue every 5 (five) minutes as needed.  25 tablet  3     Past Medical History  Diagnosis Date  . Rosacea   . Allergic rhinitis, cause unspecified   . Degeneration of lumbar or lumbosacral intervertebral disc   . Myalgia and myositis, unspecified   . Other and unspecified hyperlipidemia   . Unspecified essential hypertension   . Old myocardial infarction 1996  . Obstructive sleep apnea (adult) (pediatric)   . Osteoarthrosis, unspecified whether generalized or localized, unspecified site   . Abdominal pain, right upper quadrant   . Rash and other nonspecific skin eruption   . Endometriosis   . Ovarian cyst   . PMB (postmenopausal bleeding)   . Shingles      Past Surgical History  Procedure Date  . Cardiac  catheterization 1996    vasospams; no blockage  . Ethmoidectomy 1998  . Sinus biopsy 1998    inverted papiloma  . Vaginal hysterectomy 1992    for mennorhagia  . Ct abd wo/w & pelvis wo cm 2013    no acute process, + minimal diverticulosis and hepatic/renal cysts, ATH calcifications  . Oophorectomy     BSO     Family History  Problem Relation Age of Onset  . Colon cancer Father   . Heart attack Mother   . Coronary artery disease Mother   . Osteoporosis Mother   . Heart disease Mother   . Heart failure Mother   . GER disease Sister   . Hypertension Sister   . Osteopenia Sister   . Breast cancer Paternal Aunt     age 25  . Heart failure Maternal Grandmother   . Heart failure Maternal Grandfather      History   Social History  . Marital Status: Married    Spouse Name: N/A    Number of Children: N/A  . 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 Smoking status: Never Smoker   . Smokeless tobacco: Never Used  . Alcohol Use: Yes     rare   . Drug Use: No  . Sexually Active: No   Other Topics Concern  . Not on file   Social History Narrative   No regular exercise-used to do [facility] will.. She is DNR, Kristin [name] is husband Kristin [name] , then son Kristin [name]       PHYSICAL EXAM   BP 140/80  Pulse 61  Ht 5\' 6"  (1.676 m)  Wt 143 lb (64.864 kg)  BMI 23.08 kg/m2  LMP 08/14/1989 Constitutional: She is oriented to person, place, and time. She appears well-developed and well-nourished. No distress.  HENT: No nasal discharge.  Head: Normocephalic and atraumatic.  Eyes: Pupils are equal and round. Right eye exhibits no discharge. Left eye exhibits no discharge.  Neck: Normal range of motion. Neck supple. No JVD present. No thyromegaly present.  Cardiovascular: Normal rate, regular rhythm, normal heart sounds. Exam reveals no gallop and no friction rub. No murmur heard.  Pulmonary/Chest: Effort normal and breath sounds normal. No stridor. No respiratory distress. She has no wheezes. She has no rales. She exhibits no tenderness.  Abdominal: Soft. Bowel sounds are normal. She exhibits no distension. There is no tenderness. There is no rebound and no guarding.  Musculoskeletal: Normal range of motion. She exhibits no edema and no tenderness.  Neurological: She is alert and oriented to person, place, and time. Coordination normal.  Skin: Skin is warm and dry. No rash noted. She is not diaphoretic. No erythema. No pallor.  Psychiatric: She has a normal mood and affect. Her behavior is normal. Judgment and thought content normal.     EKG: Normal sinus rhythm.  ASSESSMENT AND PLAN

## 2012-06-21 ENCOUNTER — Ambulatory Visit (INDEPENDENT_AMBULATORY_CARE_PROVIDER_SITE_OTHER): Payer: Medicare Other | Admitting: Cardiovascular Disease

## 2012-06-21 ENCOUNTER — Encounter: Payer: Self-pay | Admitting: Cardiovascular Disease

## 2012-06-21 VITALS — BP 138/68 | HR 67 | Ht 66.0 in | Wt 142.5 lb

## 2012-06-21 DIAGNOSIS — I10 Essential (primary) hypertension: Secondary | ICD-10-CM

## 2012-06-21 DIAGNOSIS — I4949 Other premature depolarization: Secondary | ICD-10-CM

## 2012-06-21 DIAGNOSIS — I493 Ventricular premature depolarization: Secondary | ICD-10-CM

## 2012-06-21 DIAGNOSIS — R0789 Other chest pain: Secondary | ICD-10-CM

## 2012-06-21 MED ORDER — METOPROLOL SUCCINATE ER 25 MG PO TB24: 25.0000 mg | ORAL_TABLET | Freq: Every day | ORAL | Status: DC

## 2012-06-21 NOTE — Patient Instructions (Addendum)
Start Metoprolol ER (Toprol) 25 mg once daily.  Switch to decaf coffee.  Follow up in 1 month.

## 2012-06-21 NOTE — Assessment & Plan Note (Signed)
Cardiac catheterization showed no obstructive coronary artery disease. Her symptoms seems to be due to PVCs.

## 2012-06-21 NOTE — Assessment & Plan Note (Signed)
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 I suspect that this is the cause of her dizziness and palpitations. I asked her to decrease her caffeine intake. I will add a small dose Toprol 25 mg once daily. I might consider decreasing the dose of diltiazem and gradually increasing the dose of Toprol but will have to be careful about that given her previously reported history of coronary spasm.

## 2012-06-21 NOTE — Progress Notes (Signed)
HPI  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  Recently, she noticed an irregular heartbeats described as skipping in her heart with increased dizziness especially when she stands up suddenly. She checked her pulse few times and it was in the high 40s. She came for her nurses at last week on Friday and was noted to be orthostatic. Her labs showed hypokalemia with a potassium of 3.3. Thus, she was asked to stop taking hydrochlorothiazide. She started having left-sided chest pain and increased dyspnea. She underwent a nuclear stress test which showed partially reversible anterior wall defect. Her symptoms persisted and thus she underwent cardiac catheterization via the right radial artery which showed no evidence of obstructive coronary artery disease. He remained issues at this time seems to be palpitations associated with dizziness.  Allergies  Allergen Reactions  . Ace Inhibitors     REACTION: Boils  . Angiotensin Receptor Blockers   . Latex     REACTION: Rash, blisters, breathing probs.  . Penicillins     REACTION: Hives, swellling     Current Outpatient Prescriptions on File Prior to Visit  Medication Sig Dispense Refill  . acetaminophen (TYLENOL) 325 MG tablet Take 325 mg by mouth every 6 (six) hours as needed.       . Calcium Carbonate (CALTRATE 600) 1500 MG TABS Take 1 tablet by mouth 2 (two) times daily.        . Coenzyme Q10 200 MG capsule Take 200 mg by mouth daily.        [name] diltiazem (DILACOR XR) 240 MG 24 hr capsule Take 1 capsule (240 mg total) by mouth daily.  30 capsule  11  . Doxylamine Succinate, Sleep, (UNISOM) 25 MG tablet Take 12.5 mg by mouth at bedtime as needed.        [name] estradiol (CLIMARA) 0.05 mg/24hr Place 1 patch  (0.05 mg total) onto the skin once a week.  13 patch  4  . fish oil-omega-3 fatty acids 1000 MG capsule Take 2 g by mouth daily.        [name] losartan (COZAAR) 50 MG tablet Take 25 mg by mouth daily.      . Multiple Vitamin (MULTIVITAMIN) tablet Take 1 tablet by mouth daily.        . nitroGLYCERIN (NITROSTAT) 0.4 MG SL tablet Place 1 tablet (0.4 mg total) under the tongue every 5 (five) minutes as needed.  25 tablet  3  . Polyethyl Glycol-Propyl Glycol (SYSTANE) 0.4-0.3 % SOLN Apply to eye as directed.        . Probiotic Product (PROBIOTIC PO) Take 1 capsule by mouth daily.        . Thiamine HCl (VITAMIN B-1) 250 MG tablet Take 250 mg by mouth daily.        . traMADol (ULTRAM) 50 MG tablet Take 1 tablet (50 mg total) by mouth every 8 (eight) hours as needed.  270 tablet  0  . triamcinolone (KENALOG) 0.5 % cream Apply 1 application topically as needed.       . metoprolol succinate (TOPROL XL) 25 MG 24 hr tablet Take 1 tablet (25 mg total) by mouth daily.  30 tablet  6     Past Medical History  Diagnosis Date  . Rosacea   . Allergic rhinitis,  cause unspecified   . Degeneration of lumbar or lumbosacral intervertebral disc   . Myalgia and myositis, unspecified   . Other and unspecified hyperlipidemia   . Unspecified essential hypertension   . Old myocardial infarction 1996  . Obstructive sleep apnea (adult) (pediatric)   . Osteoarthrosis, unspecified whether generalized or localized, unspecified site   . Abdominal pain, right upper quadrant   . Rash and other nonspecific skin eruption   . Endometriosis   . Ovarian cyst   . PMB (postmenopausal bleeding)   . Shingles      Past Surgical History  Procedure Date  . Ethmoidectomy 1998  . Sinus biopsy 1998    inverted papiloma  . Vaginal hysterectomy 1992    for mennorhagia  . Ct abd wo/w & pelvis wo cm 2013    no acute process, + minimal diverticulosis and hepatic/renal cysts, ATH calcifications  . Oophorectomy     BSO  . Cardiac  catheterization 1996    vasospams; no blockage  . Cardiac catheterization 2013    No obstructive coronary artery disease. Mild distal LAD and proximal RCA disease.     Family History  Problem Relation Age of Onset  . Colon cancer Father   . Heart attack Mother   . Coronary artery disease Mother   . Osteoporosis Mother   . Heart disease Mother   . Heart failure Mother   . GER disease Sister   . Hypertension Sister   . Osteopenia Sister   . Breast cancer Paternal Aunt     age 53  . Heart failure Maternal Grandmother   . Heart failure Maternal Grandfather      History   Social History  . Marital Status: Married    Spouse Name: N/A    Number of Children: N/A  . 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 Smoking status: Never Smoker   . Smokeless tobacco: Never Used  . Alcohol Use: Yes     Comment: rare   . Drug Use: No  . Sexually Active: No   Other Topics Concern  . Not on file   Social History Narrative   No regular exercise-used to do [facility] will.. She is DNR, Kristin [name] is husband Kristin [name] , then son Kristin [name]       PHYSICAL EXAM   BP 138/68  Pulse 67  Ht 5\' 6"  (1.676 m)  Wt 142 lb 8 oz (64.638 kg)  BMI 23.00 kg/m2  LMP 08/14/1989 Constitutional: She is oriented to person, place, and time. She appears well-developed and well-nourished. No distress.  HENT: No nasal discharge.  Head: Normocephalic and atraumatic.  Eyes: Pupils are equal and round. Right eye exhibits no discharge. Left eye exhibits no discharge.  Neck: Normal range of motion. Neck supple. No JVD present. No thyromegaly present.  Cardiovascular: Normal rate, regular rhythm with premature beats, normal heart sounds. Exam reveals no gallop and no friction rub. No murmur heard.  Pulmonary/Chest: Effort normal and breath sounds normal. No stridor. No respiratory distress. She has no wheezes. She has no  rales. She exhibits no tenderness.  Abdominal: Soft. Bowel sounds are normal. She exhibits no distension. There is no tenderness. There is no rebound and no guarding.  Musculoskeletal: Normal range of motion. She exhibits no edema and no tenderness.  Neurological: She is alert and oriented to person, place, and time. Coordination normal.  Skin: Skin is  warm and dry. No rash noted. She is not diaphoretic. No erythema. No pallor.  Psychiatric: She has a normal mood and affect. Her behavior is normal. Judgment and thought content normal.  Right radial pulse is normal with ecchymosis but no hematoma.   EKG: Sinus  Rhythm  -Frequent pvcs -ventricular trigeminy  ABNORMAL RHYTHM   ASSESSMENT AND PLAN

## 2012-07-22 ENCOUNTER — Ambulatory Visit (INDEPENDENT_AMBULATORY_CARE_PROVIDER_SITE_OTHER): Payer: Medicare Other | Admitting: Cardiovascular Disease

## 2012-07-22 ENCOUNTER — Encounter: Payer: Self-pay | Admitting: Cardiovascular Disease

## 2012-07-22 VITALS — BP 140/70 | HR 52 | Ht 66.0 in | Wt 142.5 lb

## 2012-07-22 DIAGNOSIS — I4949 Other premature depolarization: Secondary | ICD-10-CM

## 2012-07-22 DIAGNOSIS — R42 Dizziness and giddiness: Secondary | ICD-10-CM

## 2012-07-22 DIAGNOSIS — I493 Ventricular premature depolarization: Secondary | ICD-10-CM

## 2012-07-22 DIAGNOSIS — I10 Essential (primary) hypertension: Secondary | ICD-10-CM

## 2012-07-22 MED ORDER — DILTIAZEM HCL ER COATED BEADS 180 MG PO CP24: 180.0000 mg | ORAL_CAPSULE | Freq: Every day | ORAL | Status: DC

## 2012-07-22 NOTE — Progress Notes (Signed)
HPI  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  Recent cardiac catheterization showed no significant [name]. She has been having problems with palpitations thought to be due to PVCs and ventricular bigeminy. During her last visit, I started her on Toprol 25 mg once daily. She actually feels better with less palpitations. Her blood pressure has been reasonable. She did notice that her heart rate is running between 50 and 60. She feels dizzy when she changes position suddenly.  Allergies  Allergen Reactions  . Ace Inhibitors     REACTION: Boils  . Angiotensin Receptor Blockers   . Latex     REACTION: Rash, blisters, breathing probs.  . Penicillins     REACTION: Hives, swellling     Current Outpatient Prescriptions on File Prior to Visit  Medication Sig Dispense Refill  . acetaminophen (TYLENOL) 325 MG tablet Take 325 mg by mouth every 6 (six) hours as needed.       . Calcium Carbonate (CALTRATE 600) 1500 MG TABS Take 1 tablet by mouth 2 (two) times daily.        . Coenzyme Q10 200 MG capsule Take 200 mg by mouth daily.        . Doxylamine Succinate, Sleep, (UNISOM) 25 MG tablet Take 12.5 mg by mouth at bedtime as needed.        [name] estradiol (CLIMARA) 0.05 mg/24hr Place 1 patch (0.05 mg total) onto the skin once a week.  13 patch  4  . fish oil-omega-3 fatty acids 1000 MG capsule Take 2 g by mouth daily.        [name] losartan (COZAAR) 50 MG tablet Take 25 mg by mouth daily.      . Magnesium 250 MG TABS Take by mouth daily.      . metoprolol succinate (TOPROL XL) 25 MG 24 hr tablet Take 1 tablet (25 mg total) by mouth daily.  30 tablet  6  . Multiple Vitamin (MULTIVITAMIN) tablet Take 1 tablet by mouth daily.        . nitroGLYCERIN (NITROSTAT) 0.4 MG SL tablet Place 1 tablet (0.4  mg total) under the tongue every 5 (five) minutes as needed.  25 tablet  3  . Polyethyl Glycol-Propyl Glycol (SYSTANE) 0.4-0.3 % SOLN Apply to eye as directed.        . Probiotic Product (PROBIOTIC PO) Take 1 capsule by mouth daily.        . Thiamine HCl (VITAMIN B-1) 250 MG tablet Take 250 mg by mouth daily.        . traMADol (ULTRAM) 50 MG tablet Take 1 tablet (50 mg total) by mouth every 8 (eight) hours as needed.  270 tablet  0  . triamcinolone (KENALOG) 0.5 % cream Apply 1 application topically as needed.       . diltiazem (CARDIZEM CD) 180 MG 24 hr capsule Take 1 capsule (180 mg total) by mouth daily.  30 capsule  6     Past Medical History  Diagnosis Date  . Rosacea   . Allergic rhinitis, cause unspecified   . Degeneration of lumbar or lumbosacral intervertebral disc   . Myalgia and myositis, unspecified   . Other and unspecified hyperlipidemia   . Unspecified essential hypertension   . Old myocardial infarction 1996  . Obstructive sleep apnea (  adult) (pediatric)   . Osteoarthrosis, unspecified whether generalized or localized, unspecified site   . Abdominal pain, right upper quadrant   . Rash and other nonspecific skin eruption   . Endometriosis   . Ovarian cyst   . PMB (postmenopausal bleeding)   . Shingles      Past Surgical History  Procedure Date  . Ethmoidectomy 1998  . Sinus biopsy 1998    inverted papiloma  . Vaginal hysterectomy 1992    for mennorhagia  . Ct abd wo/w & pelvis wo cm 2013    no acute process, + minimal diverticulosis and hepatic/renal cysts, ATH calcifications  . Oophorectomy     BSO  . Cardiac catheterization 1996    vasospams; no blockage  . Cardiac catheterization 2013    No obstructive coronary artery disease. Mild distal LAD and proximal RCA disease.     Family History  Problem Relation Age of Onset  . Colon cancer Father   . Heart attack Mother   . Coronary artery disease Mother   . Osteoporosis Mother   . Heart disease Mother    . Heart failure Mother   . GER disease Sister   . Hypertension Sister   . Osteopenia Sister   . Breast cancer Paternal Aunt     age 57  . Heart failure Maternal Grandmother   . Heart failure Maternal Grandfather      History   Social History  . Marital Status: Married    Spouse Name: N/A    Number of Children: N/A  . 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 Smoking status: Never Smoker   . Smokeless tobacco: Never Used  . Alcohol Use: Yes     Comment: rare   . Drug Use: No  . Sexually Active: No   Other Topics Concern  . Not on file   Social History Narrative   No regular exercise-used to do [facility] will.. She is DNR, Avon Gully is husband Elenore Paddy , then son Aneta Mins       PHYSICAL EXAM   BP 140/70  Pulse 52  Ht 5\' 6"  (1.676 m)  Wt 142 lb 8 oz (64.638 kg)  BMI 23.00 kg/m2  LMP 08/14/1989 Constitutional: She is oriented to person, place, and time. She appears well-developed and well-nourished. No distress.  HENT: No nasal discharge.  Head: Normocephalic and atraumatic.  Eyes: Pupils are equal and round. Right eye exhibits no discharge. Left eye exhibits no discharge.  Neck: Normal range of motion. Neck supple. No JVD present. No thyromegaly present.  Cardiovascular: Normal rate, regular rhythm , normal heart sounds. Exam reveals no gallop and no friction rub. No murmur heard.  Pulmonary/Chest: Effort normal and breath sounds normal. No stridor. No respiratory distress. She has no wheezes. She has no rales. She exhibits no tenderness.  Abdominal: Soft. Bowel sounds are normal. She exhibits no distension. There is no tenderness. There is no rebound and no guarding.  Musculoskeletal: Normal range of motion. She exhibits no edema and no tenderness.  Neurological: She is alert and oriented to person, place, and time. Coordination normal.  Skin: Skin is warm and dry. No  rash noted. She is not diaphoretic. No erythema. No pallor.  Psychiatric: She has a normal mood and affect. Her behavior is normal. Judgment and thought content normal.  Right radial pulse is normal with ecchymosis but no hematoma.   EKG:  Sinus bradycardia with a heart rate of 52 beats per minute. LVH.  ASSESSMENT AND PLAN

## 2012-07-22 NOTE — Patient Instructions (Addendum)
Decrease Diltiazem to 180 mg once daily.  Continue other medications.  Follow up in 6 months.

## 2012-07-22 NOTE — Assessment & Plan Note (Signed)
These improved significantly after the addition of small dose Toprol 25 mg once daily. However, she is bradycardic and thus I recommend decreasing the dose of diltiazem 180 mg once daily.

## 2012-07-22 NOTE — Assessment & Plan Note (Signed)
Blood pressure is reasonably controlled. Hydrochlorothiazide was stopped due to hyponatremia and dizziness. She reports cough when she takes higher dose of losartan. Continue to monitor blood pressure for now. She does have trace lower extremity edema which is likely due to chronic venous insufficiency. I agree with support stockings and leg elevation. If this worsens, we can give a small dose furosemide 20 mg daily to be used as needed only.

## 2012-08-05 ENCOUNTER — Telehealth: Payer: Self-pay | Admitting: Family Medicine

## 2012-08-05 ENCOUNTER — Other Ambulatory Visit (INDEPENDENT_AMBULATORY_CARE_PROVIDER_SITE_OTHER): Payer: Medicare Other

## 2012-08-05 DIAGNOSIS — I10 Essential (primary) hypertension: Secondary | ICD-10-CM

## 2012-08-05 DIAGNOSIS — E785 Hyperlipidemia, unspecified: Secondary | ICD-10-CM

## 2012-08-05 LAB — COMPREHENSIVE METABOLIC PANEL WITH GFR
ALT: 18 U/L (ref 0–35)
AST: 26 U/L (ref 0–37)
Albumin: 3.8 g/dL (ref 3.5–5.2)
Alkaline Phosphatase: 47 U/L (ref 39–117)
BUN: 16 mg/dL (ref 6–23)
CO2: 30 meq/L (ref 19–32)
Calcium: 9.2 mg/dL (ref 8.4–10.5)
Chloride: 103 meq/L (ref 96–112)
Creatinine, Ser: 0.9 mg/dL (ref 0.4–1.2)
GFR: 66.29 mL/min
Glucose, Bld: 87 mg/dL (ref 70–99)
Potassium: 3.7 meq/L (ref 3.5–5.1)
Sodium: 138 meq/L (ref 135–145)
Total Bilirubin: 0.5 mg/dL (ref 0.3–1.2)
Total Protein: 6.7 g/dL (ref 6.0–8.3)

## 2012-08-05 LAB — LIPID PANEL
Cholesterol: 196 mg/dL (ref 0–200)
HDL: 63.3 mg/dL
LDL Cholesterol: 113 mg/dL — ABNORMAL HIGH (ref 0–99)
Total CHOL/HDL Ratio: 3
Triglycerides: 99 mg/dL (ref 0.0–149.0)
VLDL: 19.8 mg/dL (ref 0.0–40.0)

## 2012-08-05 NOTE — Telephone Encounter (Signed)
Message copied by Excell Seltzer on Mon Aug 05, 2012  8:18 AM ------      Message from: Baldomero Lamy      Created: Tue Jul 30, 2012 12:44 PM      Regarding: Cpx labs Mon 12/23       Please order  future cpx labs for pt's upcoming lab appt.      Thanks      Rodney Booze

## 2012-08-13 ENCOUNTER — Encounter: Payer: Self-pay | Admitting: Family Medicine

## 2012-08-13 ENCOUNTER — Ambulatory Visit (INDEPENDENT_AMBULATORY_CARE_PROVIDER_SITE_OTHER): Payer: Medicare Other | Admitting: Family Medicine

## 2012-08-13 VITALS — BP 124/80 | HR 51 | Temp 97.5°F | Ht 65.0 in | Wt 141.5 lb

## 2012-08-13 DIAGNOSIS — E785 Hyperlipidemia, unspecified: Secondary | ICD-10-CM

## 2012-08-13 DIAGNOSIS — I10 Essential (primary) hypertension: Secondary | ICD-10-CM

## 2012-08-13 DIAGNOSIS — Z0000 Encounter for general adult medical examination without abnormal findings: Secondary | ICD-10-CM

## 2012-08-13 NOTE — Assessment & Plan Note (Addendum)
Somewhat irratic at home per pt. Continue current medication. Consider talking with Cardiologist if fluctuations continue.

## 2012-08-13 NOTE — Patient Instructions (Addendum)
Consider trial of red yeast rice 2400 mg divided daily.. 600 mg two tab twice daily.  If you start this call for fasting lipid and CMET recheck in 3 months.  Work on exercise and weight loss and healthy eating.  Keep a diary about abdominal pain and relationship to food.

## 2012-08-13 NOTE — Progress Notes (Signed)
HPI  I have personally reviewed the Medicare Annual Wellness questionnaire and have noted  1. The patient's medical and social history  2. Their use of alcohol, tobacco or illicit drugs  3. Their current medications and supplements  4. The patient's functional ability including ADL's, fall risks, home safety risks and hearing or visual  impairment.  5. Diet and physical activities  6. Evidence for depression or mood disorders   The patients weight, height, BMI and visual acuity have been recorded in the chart  I have made referrals, counseling and provided education to the patient based review of the above and I have provided the pt with a written personalized care plan for preventive services.   Hypertension: Well controlled on losartan, diltiazem, HCTZ  Using medication without problems or lightheadedness:SE to ACEI, clonidine and BBlockers in past.  Chest pain with exertion:None  Edema:None  Short of breath:Mild with stairs, but stable  Average home WUJ:WJXB low and some highs Other issues:   Last OV from Dr. 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 Recent cardiac catheterization showed no significant [name].  She had been having problems with palpitations thought to be due to PVCs and ventricular bigeminy. During her last  Cardiology visit, he started her on Toprol 25 mg once daily. She actually feels better with less palpitations. Her blood pressure has been reasonable. Decreased cardiazem and losartan.  Elevated Cholesterol: On no medication.[name] Hesitant about statins in general. Lab Results  Component Value Date   CHOL 196 08/05/2012   HDL 63.30 08/05/2012   LDLCALC 113* 08/05/2012   LDLDIRECT 109.4 07/17/2011   TRIG 99.0 08/05/2012   CHOLHDL 3 08/05/2012  Using medications without problems:None  Muscle aches: None  Diet compliance: Moderate, [employer]. Exercise: 3 days a week.  Other  complaints: Hx of MI thought to be secondary to coronary spasm followed by Dr . Mariah Milling.  .  Fibromyalgia/inflammatory arthritis: Per rheumatology Dr. Maude Leriche, Good control on tramadol 50 mg daily, does make her somewhat sleepy.  Also goes to acupuncturist.   HRT: with estradiol per Dr. Audie Box. Has reviewed with him recently and has tried to stop patches but was not able to. She is on minimal dose.   Seeing Dr. Richardean Chimera for spleep apnea.  Using CPAP but having some trouble with is.[name]  HAs appt 08/2011.  Review of Systems  Constitutional: Positive for fatigue. Negative for fever and unexpected weight change.  HENT: Negative for ear pain, congestion, sore throat, sneezing, trouble swallowing and sinus pressure.  Eyes: Negative for pain and itching.  Respiratory: Negative for cough, shortness of breath and wheezing.  Cardiovascular: Negative for chest pain, palpitations and leg swelling.  Gastrointestinal: Negative for nausea,  Some  intermittant 3/10, abdominal pain x 1 year in left upper quadrant  Related to after eating, some gas no bloating, has history of [name], probiotic helps with this., diarrhea, constipation and blood in stool.  Genitourinary: Negative for dysuria, hematuria, vaginal discharge, difficulty urinating and menstrual problem.  Skin: Negative for rash.  Neurological: Positive for dizziness. Negative for syncope, weakness, light-headedness, numbness and headaches.  Psychiatric/Behavioral: Negative for confusion and dysphoric mood. The patient is not nervous/anxious.    Objective:   Physical Exam  Constitutional: Vital signs are normal. She appears well-developed and well-nourished. She is cooperative. Non-toxic appearance. She does not appear ill. No distress.  HENT:  Head: Normocephalic.  Right Ear: Hearing, tympanic membrane, external  ear and ear canal normal.  Left Ear: Hearing, tympanic membrane, external ear and ear canal normal.  Nose: Mucosal edema present.    Mouth/Throat: Uvula is midline, oropharynx is clear and moist and mucous membranes are normal.  Eyes: Conjunctivae, EOM and lids are normal. Pupils are equal, round, and reactive to light. No foreign bodies found.  Neck: Trachea normal and normal range of motion. Neck supple. Carotid bruit is not present. No mass and no thyromegaly present.  Cardiovascular: Normal rate, regular rhythm, S1 normal, S2 normal, normal heart sounds and intact distal pulses. Exam reveals no gallop.  No murmur heard.  Pulmonary/Chest: Effort normal and breath sounds normal. No respiratory distress. She has no wheezes. She has no rhonchi. She has no rales.  Abdominal: Soft. Normal appearance and bowel sounds are normal. She exhibits no distension, no fluid wave, no abdominal bruit and no mass. There is no hepatosplenomegaly.mild tenderness in left upper quadrant. There is no rebound, no guarding and no CVA tenderness. No hernia.  Lymphadenopathy:  She has no cervical adenopathy.  She has no axillary adenopathy.  Neurological: She is alert. She has normal strength. No cranial nerve deficit or sensory deficit.  Skin: Skin is warm, dry and intact. No rash noted.  Multiple moles, no concerning skin lesions  Psychiatric: Her speech is normal and behavior is normal. Judgment normal. Her mood appears not anxious. Cognition and memory are normal. She does not exhibit a depressed mood.    Assessment & Plan:   AMW: The patient's preventative maintenance and recommended screening tests for an annual wellness exam were reviewed in full today.  Brought up to date unless services declined.  Counselled on the importance of diet, exercise, and its role in overall health and mortality.  The patient's FH and SH was reviewed, including their home life, tobacco status, and drug and alcohol status.   Vaccines: uptodate with TD, zoster, PNA, Flu  Mammogram: 03/2012 nml  DVE/pap: not indicated, given age and S/P vaginal hysterectomy for  noncancer reason , sees GYN Colon: 2011.. Nml, likely no repeat needed.  DEXA: 03/2011 at GYN nml.

## 2012-08-13 NOTE — Assessment & Plan Note (Signed)
Consider starting red yeast rice. Goal LDL <70.

## 2012-08-30 ENCOUNTER — Telehealth: Payer: Self-pay | Admitting: *Deleted

## 2012-08-30 NOTE — Telephone Encounter (Signed)
Pt said that her climara patch has increased to (tier 4) now $ 68 for co-pay. I spoke with pt about this and asked her to call insurance company and ask for a list of all tier HRT. Or we could try a oral medication. Pt said she may just try a herb supplement to help with hot flashes. I told pt this was her decision and to follow up.

## 2012-08-30 NOTE — Telephone Encounter (Signed)
Pt called stating co-pay for climara patch has increased to $68.00. Left message for pt to call regarding this.

## 2012-09-17 ENCOUNTER — Other Ambulatory Visit: Payer: Self-pay | Admitting: *Deleted

## 2012-09-17 MED ORDER — LOSARTAN POTASSIUM 50 MG PO TABS: 25.0000 mg | ORAL_TABLET | Freq: Every day | ORAL | Status: DC

## 2012-09-18 ENCOUNTER — Telehealth: Payer: Self-pay

## 2012-09-18 NOTE — Telephone Encounter (Signed)
Sherry with [facility] wanted to clarify directions for Losartan 50 mg pt taking 1/2 tab daily. Advised that was correct on med list;Sherry has not been able to reach pt for clarification. No answer when I tried to call pt. Please advise.

## 2012-09-19 NOTE — Telephone Encounter (Signed)
i believe this is correct... But try again to verify with pt.

## 2012-09-20 NOTE — Telephone Encounter (Signed)
sherri with [facility] made aware Understanding verb

## 2012-09-20 NOTE — Telephone Encounter (Signed)
According to most recent note, she is on Losartan 25 mg once daily.

## 2012-09-20 NOTE — Telephone Encounter (Signed)
Unable to contact patient but it looks like cardiology increased dose at last visit will route to cardiology to handle

## 2012-10-07 ENCOUNTER — Telehealth: Payer: Self-pay | Admitting: *Deleted

## 2012-10-07 NOTE — Telephone Encounter (Signed)
(  late entry) Prior authorization faxed to [facility] for climara 0.5 mg patch. [employer] called back and said medication was approved as of 10/01/12.

## 2012-10-08 ENCOUNTER — Telehealth: Payer: Self-pay

## 2012-10-08 NOTE — Telephone Encounter (Signed)
Bp looks reasonable on most days.

## 2012-10-08 NOTE — Telephone Encounter (Signed)
Pt dropped off BP readings: 138/60 150/70 124/80 140/70 188/60 120/60 160/60 118/70 138/70

## 2012-10-08 NOTE — Telephone Encounter (Signed)
Pt informed Understanding verb 

## 2012-10-15 ENCOUNTER — Other Ambulatory Visit: Payer: Self-pay | Admitting: *Deleted

## 2012-10-15 MED ORDER — LOSARTAN POTASSIUM 50 MG PO TABS: 25.0000 mg | ORAL_TABLET | Freq: Every day | ORAL | Status: DC

## 2012-10-15 NOTE — Telephone Encounter (Signed)
Refilled Losartan sent to [facility] pharmacy.

## 2012-11-05 ENCOUNTER — Encounter: Payer: Self-pay | Admitting: Neurology

## 2012-11-20 ENCOUNTER — Other Ambulatory Visit: Payer: Self-pay

## 2012-11-20 NOTE — Telephone Encounter (Signed)
Pt left v/m requesting refill Tramadol to [facility].Please advise.

## 2012-11-21 MED ORDER — TRAMADOL HCL 50 MG PO TABS: 50.0000 mg | ORAL_TABLET | Freq: Three times a day (TID) | ORAL | Status: DC | PRN

## 2012-11-27 ENCOUNTER — Telehealth: Payer: Self-pay | Admitting: *Deleted

## 2012-11-27 NOTE — Telephone Encounter (Signed)
called patient and per referral note, informed patient the PT is for orthostatic dizziness, patient understood and stated that she will call neurorehab to find out what kind of medicine they are giving her for therapy

## 2012-11-27 NOTE — Telephone Encounter (Signed)
patient is  wanting to know why she is having evaluation PT.

## 2012-11-28 ENCOUNTER — Encounter: Payer: Self-pay | Admitting: *Deleted

## 2012-11-28 NOTE — Telephone Encounter (Signed)
This encounter was created in error - please disregard.

## 2012-12-03 ENCOUNTER — Telehealth: Payer: Self-pay | Admitting: *Deleted

## 2012-12-03 NOTE — Telephone Encounter (Signed)
Message copied by Monico Blitz on Tue Dec 03, 2012 10:16 AM ------      Message from: Warren Lacy A      Created: Tue Dec 03, 2012  8:47 AM      Contact: Kristin [name] called and said she will be having testing done on Friday( Vestibular Eval) and she was told that she will be put on medication that will make her dizzy. She has questions about the medication. Please call at [phone] ------

## 2012-12-03 NOTE — Telephone Encounter (Signed)
Called patient and she said that the meds that they will order, she is not going to take, taking enough medicine already. Called neurorehab and they stated that they don't  Have any records that any one from their office had called her other than to sched appt.

## 2012-12-03 NOTE — Telephone Encounter (Signed)
Called patient to inform of the , patient stated understanding.

## 2012-12-03 NOTE — Telephone Encounter (Signed)
Message copied by Monico Blitz on Tue Dec 03, 2012 10:27 AM ------      Message from: Warren Lacy A      Created: Tue Dec 03, 2012  8:47 AM      Contact: Kristin [name] called and said she will be having testing done on Friday( Vestibular Eval) and she was told that she will be put on medication that will make her dizzy. She has questions about the medication. Please call at [phone] ------

## 2012-12-06 ENCOUNTER — Ambulatory Visit: Payer: Medicare Other | Attending: Neurology | Admitting: Physical Therapy

## 2012-12-06 DIAGNOSIS — IMO0001 Reserved for inherently not codable concepts without codable children: Secondary | ICD-10-CM | POA: Insufficient documentation

## 2012-12-06 DIAGNOSIS — R42 Dizziness and giddiness: Secondary | ICD-10-CM | POA: Insufficient documentation

## 2012-12-06 DIAGNOSIS — R269 Unspecified abnormalities of gait and mobility: Secondary | ICD-10-CM | POA: Insufficient documentation

## 2012-12-10 ENCOUNTER — Ambulatory Visit: Payer: Medicare Other | Admitting: Physical Therapy

## 2012-12-30 ENCOUNTER — Ambulatory Visit: Payer: Medicare Other | Attending: Neurology | Admitting: Physical Therapy

## 2012-12-30 DIAGNOSIS — R269 Unspecified abnormalities of gait and mobility: Secondary | ICD-10-CM | POA: Insufficient documentation

## 2012-12-30 DIAGNOSIS — R42 Dizziness and giddiness: Secondary | ICD-10-CM | POA: Insufficient documentation

## 2012-12-30 DIAGNOSIS — IMO0001 Reserved for inherently not codable concepts without codable children: Secondary | ICD-10-CM | POA: Insufficient documentation

## 2013-01-01 ENCOUNTER — Encounter: Payer: Self-pay | Admitting: Neurology

## 2013-01-01 ENCOUNTER — Ambulatory Visit (INDEPENDENT_AMBULATORY_CARE_PROVIDER_SITE_OTHER): Payer: Medicare Other | Admitting: Neurology

## 2013-01-01 VITALS — BP 138/60 | HR 58 | Ht 66.0 in | Wt 142.0 lb

## 2013-01-01 DIAGNOSIS — I679 Cerebrovascular disease, unspecified: Secondary | ICD-10-CM | POA: Insufficient documentation

## 2013-01-01 DIAGNOSIS — I608 Other nontraumatic subarachnoid hemorrhage: Secondary | ICD-10-CM | POA: Insufficient documentation

## 2013-01-01 NOTE — Patient Instructions (Addendum)

## 2013-01-01 NOTE — Progress Notes (Signed)
[facility]  Provider:  Dr Shey Bartmess Referring Provider: Excell Seltzer, MD Primary Care Physician:  Kerby Nora, MD  Chief Complaint  Patient presents with  . Neurologic Problem    RM#13    HPI:  Kristin [name] is a 72 y.o. female here as a referral from Dr. Ermalene Searing for dizziness in 2012. Patient was in the past also evaluated here for sleepiness and fatigue of which the fatigue has been more of a problem which we have from the unable to improve. From an organic sleep medicine aspect we have tried the patient on a CPAP machine at various other settings and none of this had a major effect on her sleep quality or improved her Epworth sleepiness score which today is 12 points I allowed t the patient to discontinue CPAP use.  MRI , brain  14 th  march 2014 , quoted below.    Review of Systems: Out of a complete 14 system review, the patient complains of only the following symptoms, and all other reviewed systems are negative.  "Pressure in my face midface especially around the eyes and the patient also states I get puffy on both sides of my face, the patient also has severe dry eyes and her eyes drip constantly she states following the use of venous onset him she feels greatly fatigued and exhausted." She is planning to see the allergy specialist, and cardiologist.   History   Social History  . Marital Status: Married    Spouse Name: N/A    Number of Children: 2  . Years of Education: BSN   Occupational History  . 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     Social History Main Topics  . Smoking status: Never Smoker   . Smokeless tobacco: Never Used  . Alcohol Use: Yes     Comment: one glass of wine per month  . Drug Use: No  . Sexually Active: No   Other Topics Concern  . Not on file   Social History Narrative   No regular exercise-used to do Yoga      [employer] will.. She is DNR, [place] Gully is husband Elenore Paddy , then son  Aneta Mins       10/22/12-cardiac work up found the reason for her dizziness,  she needs no neurological follow up for that.  her enthusiasm for CPAP remains limited and is explained by her resiudal high AHI after she finally reached compliance ,Epworth 13 points.     95% pressure is 11.7 cm , AHI  20 .7- she does not benefit from CPAP . She is allowed to d/c the machine and we need to concerntrate on her dizziness and facial right droop. ordered MRI brain repeat.    Eye exam next week. PT at [facility]. MRI brain in April- per patients request order next month.              Family History  Problem Relation Age of Onset  . Colon cancer Father   . Heart attack Mother   . Coronary artery disease Mother   . Osteoporosis Mother   . Heart disease Mother   . Heart failure Mother   . GER disease Sister   . Hypertension Sister   . Osteopenia Sister   . Breast cancer Paternal Aunt     age 84  . Heart failure Maternal Grandmother   . Heart failure Maternal Grandfather  Past Medical History  Diagnosis Date  . Rosacea   . Allergic rhinitis, cause unspecified   . Degeneration of lumbar or lumbosacral intervertebral disc   . Myalgia and myositis, unspecified   . Other and unspecified hyperlipidemia   . Old myocardial infarction 1996  . Obstructive sleep apnea (adult) (pediatric)   . Osteoarthrosis, unspecified whether generalized or localized, unspecified site   . Abdominal pain, right upper quadrant   . Rash and other nonspecific skin eruption   . Endometriosis   . Ovarian cyst   . PMB (postmenopausal bleeding)   . Shingles   . Stroke     ischaemic microvascular disease  . MI (mitral incompetence)     1996,cardiac related dizziness, fainting PVC's  . SAH (subarachnoid hemorrhage) 1991  . Arthritis     recent falls -aug 2010  . Unspecified essential hypertension     without LVH  . Pain     CHRONIC  . [name] (coronary artery disease)     mi-1996  . S/P TAH-BSO 1990  . Subarachnoid  hemorrhage due to ruptured aneurysm     1991 , bleed and dizziness     Past Surgical History  Procedure Laterality Date  . Ethmoidectomy  1998  . Sinus biopsy  1998    inverted papiloma  . Vaginal hysterectomy  1992    for mennorhagia  . Ct abd wo/w & pelvis wo cm  2013    no acute process, + minimal diverticulosis and hepatic/renal cysts, ATH calcifications  . Oophorectomy      BSO  . Cardiac catheterization  1996    vasospams; no blockage  . Cardiac catheterization  2013    No obstructive coronary artery disease. Mild distal LAD and proximal RCA disease.  . Atherectomy  1996  . Cataract extraction    . Nasal sinus surgery  1998    Current Outpatient Prescriptions  Medication Sig Dispense Refill  . acetaminophen (TYLENOL) 325 MG tablet Take 325 mg by mouth every 6 (six) hours as needed.       [name] aspirin EC 81 MG tablet Take 81 mg by mouth daily.      . Calcium Carbonate-Vitamin D (CALTRATE 600+D PO) Take by mouth daily.      . Coenzyme Q10 200 MG capsule Take 200 mg by mouth daily.        [name] diltiazem (CARDIZEM CD) 180 MG 24 hr capsule Take 1 capsule (180 mg total) by mouth daily.  30 capsule  6  . Doxylamine Succinate, Sleep, (UNISOM) 25 MG tablet Take 12.5 mg by mouth at bedtime as needed.        [name] estradiol (CLIMARA) 0.05 mg/24hr Place 1 patch (0.05 mg total) onto the skin once a week.  13 patch  4  . fish oil-omega-3 fatty acids 1000 MG capsule Take 2 g by mouth daily.        [name] L-Lysine 500 MG TABS Take 1 tablet by mouth daily.      [name] LOSARTAN POTASSIUM PO Take 25 mg by mouth daily.      . Magnesium 250 MG TABS Take by mouth daily.      . metoprolol succinate (TOPROL XL) 25 MG 24 hr tablet Take 1 tablet (25 mg total) by mouth daily.  30 tablet  6  . metroNIDAZOLE (METROGEL) 0.75 % gel Apply 1 application topically 2 (two) times daily.      . Multiple Vitamin (MULTIVITAMIN) tablet Take 1 tablet by mouth daily.        [name]  Polyethyl Glycol-Propyl Glycol (SYSTANE) 0.4-0.3 % SOLN  Apply to eye as directed.        . Probiotic Product (PROBIOTIC PO) Take 1 capsule by mouth daily.        . Thiamine HCl (VITAMIN B-1) 250 MG tablet Take 250 mg by mouth daily.        . traMADol (ULTRAM) 50 MG tablet Take 1 tablet (50 mg total) by mouth every 8 (eight) hours as needed.  270 tablet  0  . triamcinolone (KENALOG) 0.5 % cream Apply 1 application topically as needed.        No current facility-administered medications for this visit.    Allergies as of 01/01/2013 - Review Complete 01/01/2013  Allergen Reaction Noted  . Ace inhibitors    . Angiotensin receptor blockers  08/01/2010  . Latex    . Penicillins      Vitals: BP 138/60  Pulse 58  Ht 5\' 6"  (1.676 m)  Wt 142 lb (64.411 kg)  BMI 22.93 kg/m2  LMP 08/14/1989 Last Weight:  Wt Readings from Last 1 Encounters:  01/01/13 145 lb (65.772 kg)   Last Height:   Ht Readings from Last 1 Encounters:  01/01/13 5\' 6"  (1.676 m)   Vision Screening:   Physical exam:  General: The patient is awake, alert and appears not in acute distress. The patient is well groomed. Head: Normocephalic, atraumatic. Neck is supple. Cardiovascular:  Regular rate and rhythm, without  murmurs or carotid bruit, and without distended neck veins. Respiratory: Lungs are clear to auscultation. Skin:  Without evidence of edema, or rash Trunk: BMI is normal. Patient sits slouched,  Hands crossed in her lab ,  little facial expression.   Neurologic exam : The patient is awake and alert, oriented to place and time.  Memory subjective described as intact.  " I just cannot multitask" -attention span & concentration ability were not tested. [name] Speech is fluent without dysarthria, dysphonia or aphasia. Mood and affect are appropriate according to patient, but to me she appears anxious and distressed. .  Cranial nerves: Pupils are equal and briskly reactive to light. Funduscopic exam without evidence of pallor or edema. Extraocular movements  in vertical  and horizontal planes intact and without nystagmus. Visual fields by finger perimetry are intact. Hearing to finger rub intact.  Facial sensation intact to fine touch. Facial motor strength is symmetric and tongue and uvula move midline.  Motor exam: Normal tone and normal muscle bulk and symmetric normal strength in all extremities.  Sensory:  Fine touch, pinprick and vibration were tested in all extremities. Coordination: Rapid alternating movements in the fingers/hands is tested and normal. Finger-to-nose maneuver tested and normal without evidence of ataxia, dysmetria or tremor.  Gait and station: Patient walks without assistive device and is able unassisted stool climb up to the exam table.  Strength within normal limits. Stance is stable and normal. Tandem gait is unfragmented. Romberg testing shows swaying , but the patient did not fall. .  Deep tendon reflexes: in the  upper and lower extremities are symmetric and intact. Babinski maneuver response is  downgoing.   Assessment:  After physical and neurologic examination, review of laboratory studies, imaging, neurophysiology testing and pre-existing records, assessment will be reviewed on the problem list.  Plan:  Treatment plan and additional workup will be reviewed under Problem List.    Kristin [name] chief complaint for this visit is ongoing dizziness. She experienced this dizziness or lightheadedness when she walked down the hallway  in our office she turned, with less than for status and acute stable during the exam she is walking with a narrow-based gait she shows no associated tremor is dysmetria of ataxia as is more shuffling. Linda Romberg examination she's weighed and she reported again that she felt quite dizzy but she did this. The dizziness is described as feeling that she sways not necessary as spinning sensation. She has been evaluated by Dr. Pollyann Kennedy ENT and he could not find and origins of the to the dizziness. A recent MRI shows  further progress small vessel disease and some brain atrophy, but later could be attributed to her history of a sub-arachnoid bleed. There is also white matter disease noted which can be found in hypertension, diabetes, hyperlipidemia, hypoxia and sleep apnea and other conditions, and in migraine patients. I have not found a clear vestibular influence. The patient has been evaluated in detail by the vestibular rehabilitation specialist next door [facility], and felt that there was no vestibulitis for her to treat. She suspected that hypotension may have played a role. I would like for the patient to have this possibility further evaluated with her PCP and her cardiologist at [place] or in [place]. I encouraged her to call him and make an earlier point and if possible as she has also had reportedly bradycardia spells.  Based on my exam and on the MRI,  there is no strong evidence for a CNS  related reason for her dizziness

## 2013-01-14 ENCOUNTER — Other Ambulatory Visit: Payer: Self-pay | Admitting: *Deleted

## 2013-01-14 MED ORDER — METOPROLOL SUCCINATE ER 25 MG PO TB24: 25.0000 mg | ORAL_TABLET | Freq: Every day | ORAL | Status: DC

## 2013-01-14 NOTE — Telephone Encounter (Signed)
Refilled Metoprolol #30 Refill#6 sent to [facility] pharmacy.

## 2013-01-24 ENCOUNTER — Telehealth: Payer: Self-pay

## 2013-01-24 ENCOUNTER — Ambulatory Visit (INDEPENDENT_AMBULATORY_CARE_PROVIDER_SITE_OTHER): Payer: Medicare Other | Admitting: Cardiovascular Disease

## 2013-01-24 ENCOUNTER — Encounter: Payer: Self-pay | Admitting: Cardiovascular Disease

## 2013-01-24 ENCOUNTER — Other Ambulatory Visit: Payer: Self-pay

## 2013-01-24 VITALS — BP 138/68 | HR 62 | Ht 66.0 in | Wt 146.0 lb

## 2013-01-24 DIAGNOSIS — I10 Essential (primary) hypertension: Secondary | ICD-10-CM

## 2013-01-24 DIAGNOSIS — I493 Ventricular premature depolarization: Secondary | ICD-10-CM

## 2013-01-24 DIAGNOSIS — I4949 Other premature depolarization: Secondary | ICD-10-CM

## 2013-01-24 MED ORDER — LOSARTAN POTASSIUM 25 MG PO TABS: 25.0000 mg | ORAL_TABLET | Freq: Every day | ORAL | Status: DC

## 2013-01-24 MED ORDER — DILTIAZEM HCL ER 120 MG PO CP12: 120.0000 mg | ORAL_CAPSULE | Freq: Two times a day (BID) | ORAL | Status: DC

## 2013-01-24 MED ORDER — DILTIAZEM HCL ER 120 MG PO CP12: 120.0000 mg | ORAL_CAPSULE | Freq: Every day | ORAL | Status: DC

## 2013-01-24 NOTE — Assessment & Plan Note (Signed)
These are still evident in her EKG. However, she seems to be less symptomatic. The reported bradycardia could actually be due to frequent PVCs and difficulty in counting heart rate accurately. Nonetheless, I will decrease the dose of diltiazem extended release 120 mg once daily. Continue treatment with small dose Toprol. If her dizziness persists, I would consider a 24-hour Holter monitor to quantify her PVCs.

## 2013-01-24 NOTE — Telephone Encounter (Signed)
error 

## 2013-01-24 NOTE — Progress Notes (Signed)
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  Recent cardiac catheterization in 05/2012 showed no significant [name]. She has known palpitations due to PVCs and ventricular bigeminy. She was started on Toprol 25 mg once daily. Her symptoms improved significantly. However, during her last visit she was noted to be slightly bradycardic. Thus, I decreased the dose of diltiazem to 180 mg once daily. She denies any chest pain or significant palpitations. She has been monitoring her blood pressure and heart rate. She had frequent heart rates reedings between 45 and 55 beats per minute. She feels dizzy occasionally.  Allergies  Allergen Reactions  . Ace Inhibitors     REACTION: Boils  . Angiotensin Receptor Blockers   . Latex     REACTION: Rash, blisters, breathing probs.  . Penicillins     REACTION: Hives, swellling     Current Outpatient Prescriptions on File Prior to Visit  Medication Sig Dispense Refill  . acetaminophen (TYLENOL) 325 MG tablet Take 325 mg by mouth every 6 (six) hours as needed.       [name] aspirin EC 81 MG tablet Take 81 mg by mouth daily.      . Calcium Carbonate-Vitamin D (CALTRATE 600+D PO) Take by mouth daily.      . Coenzyme Q10 200 MG capsule Take 200 mg by mouth daily.        [name] diltiazem (CARDIZEM CD) 180 MG 24 hr capsule Take 1 capsule (180 mg total) by mouth daily.  30 capsule  6  . Doxylamine Succinate, Sleep, (UNISOM) 25 MG tablet Take 12.5 mg by mouth at bedtime as needed.        [name] estradiol (CLIMARA) 0.05 mg/24hr Place 1 patch (0.05 mg total) onto the skin once a week.  13 patch  4  . fish oil-omega-3 fatty acids 1000 MG capsule Take 2 g by mouth daily.        [name] L-Lysine 500 MG TABS Take 1 tablet by mouth 2 (two) times daily.       . Magnesium 250 MG  TABS Take by mouth daily.      . metoprolol succinate (TOPROL XL) 25 MG 24 hr tablet Take 1 tablet (25 mg total) by mouth daily.  30 tablet  6  . metroNIDAZOLE (METROGEL) 0.75 % gel Apply 1 application topically 2 (two) times daily.      . Multiple Vitamin (MULTIVITAMIN) tablet Take 1 tablet by mouth daily.        Bertram Gala Glycol-Propyl Glycol (SYSTANE) 0.4-0.3 % SOLN Apply to eye as directed.        . Probiotic Product (PROBIOTIC PO) Take 1 capsule by mouth daily.        . Thiamine HCl (VITAMIN B-1) 250 MG tablet Take 250 mg by mouth daily.        . traMADol (ULTRAM) 50 MG tablet Take 1 tablet (50 mg total) by mouth every 8 (eight) hours as needed.  270 tablet  0  . triamcinolone (KENALOG) 0.5 % cream Apply 1 application topically as needed.        No current facility-administered medications on file prior to visit.     Past Medical History  Diagnosis Date  . Rosacea   . Allergic rhinitis, cause unspecified   . Degeneration of lumbar or lumbosacral  intervertebral disc   . Myalgia and myositis, unspecified   . Other and unspecified hyperlipidemia   . Old myocardial infarction 1996  . Obstructive sleep apnea (adult) (pediatric)   . Osteoarthrosis, unspecified whether generalized or localized, unspecified site   . Abdominal pain, right upper quadrant   . Rash and other nonspecific skin eruption   . Endometriosis   . Ovarian cyst   . PMB (postmenopausal bleeding)   . Shingles   . Stroke     ischaemic microvascular disease  . MI (mitral incompetence)     1996,cardiac related dizziness, fainting PVC's  . SAH (subarachnoid hemorrhage) 1991  . Arthritis     recent falls -aug 2010  . Unspecified essential hypertension     without LVH  . Pain     CHRONIC  . [name] (coronary artery disease)     mi-1996  . S/P TAH-BSO 1990  . Subarachnoid hemorrhage due to ruptured aneurysm     1991 , bleed and dizziness      Past Surgical History  Procedure Laterality Date  . Ethmoidectomy   1998  . Sinus biopsy  1998    inverted papiloma  . Vaginal hysterectomy  1992    for mennorhagia  . Ct abd wo/w & pelvis wo cm  2013    no acute process, + minimal diverticulosis and hepatic/renal cysts, ATH calcifications  . Oophorectomy      BSO  . Cardiac catheterization  1996    vasospams; no blockage  . Cardiac catheterization  2013    No obstructive coronary artery disease. Mild distal LAD and proximal RCA disease.  . Atherectomy  1996  . Cataract extraction    . Nasal sinus surgery  1998     Family History  Problem Relation Age of Onset  . Colon cancer Father   . Heart attack Mother   . Coronary artery disease Mother   . Osteoporosis Mother   . Heart disease Mother   . Heart failure Mother   . GER disease Sister   . Hypertension Sister   . Osteopenia Sister   . Breast cancer Paternal Aunt     age 70  . Heart failure Maternal Grandmother   . Heart failure Maternal Grandfather      History   Social History  . Marital Status: Married    Spouse Name: N/A    Number of Children: 2  . Years of Education: BSN   Occupational History  . 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     Social History Main Topics  . Smoking status: Never Smoker   . Smokeless tobacco: Never Used  . Alcohol Use: Yes     Comment: one glass of wine per month  . Drug Use: No  . Sexually Active: No   Other Topics Concern  . Not on file   Social History Narrative   No regular exercise-used to do Yoga      [employer] will.. She is DNR, [place] Gully is husband Elenore Paddy , then son Aneta Mins       10/22/12-cardiac work up found the reason for her dizziness,  she needs no neurological follow up for that.  her enthusiasm for CPAP remains limited and is explained by her resiudal high AHI after she finally reached compliance ,Epworth 13 points.     95% pressure is 11.7 cm , AHI  20 .7- she does not benefit from CPAP .  She is allowed to d/c the machine and we need to  concerntrate on her dizziness and facial right droop. ordered MRI brain repeat.    Eye exam next week. PT at [facility]. MRI brain in April- per patients request order next month.                PHYSICAL EXAM   BP 138/68  Pulse 62  Ht 5\' 6"  (1.676 m)  Wt 146 lb (66.225 kg)  BMI 23.58 kg/m2  LMP 08/14/1989 Constitutional: She is oriented to person, place, and time. She appears well-developed and well-nourished. No distress.  HENT: No nasal discharge.  Head: Normocephalic and atraumatic.  Eyes: Pupils are equal and round. Right eye exhibits no discharge. Left eye exhibits no discharge.  Neck: Normal range of motion. Neck supple. No JVD present. No thyromegaly present.  Cardiovascular: Normal rate, regular rhythm , normal heart sounds. Exam reveals no gallop and no friction rub. No murmur heard.  Pulmonary/Chest: Effort normal and breath sounds normal. No stridor. No respiratory distress. She has no wheezes. She has no rales. She exhibits no tenderness.  Abdominal: Soft. Bowel sounds are normal. She exhibits no distension. There is no tenderness. There is no rebound and no guarding.  Musculoskeletal: Normal range of motion. She exhibits no edema and no tenderness.  Neurological: She is alert and oriented to person, place, and time. Coordination normal.  Skin: Skin is warm and dry. No rash noted. She is not diaphoretic. No erythema. No pallor.  Psychiatric: She has a normal mood and affect. Her behavior is normal. Judgment and thought content normal.  Right radial pulse is normal with ecchymosis but no hematoma.   EKG: Sinus  Rhythm  -Frequent pvcs -ventricular bigeminy  -Left atrial enlargement.   -Nonspecific ST depression  -Nondiagnostic.   ABNORMAL   ASSESSMENT AND PLAN

## 2013-01-24 NOTE — Assessment & Plan Note (Signed)
Blood pressure is reasonably controlled. Hydrochlorothiazide was stopped due to hyponatremia and dizziness. She reports cough when she takes higher dose of losartan. Continue to monitor blood pressure for now. Target systolic blood pressure of less than 150 mmHg is acceptable.

## 2013-01-24 NOTE — Patient Instructions (Addendum)
Decrease Diltiazem ER to 120 mg once daily.  Continue other medications.  Follow up in 1 year.

## 2013-01-30 ENCOUNTER — Telehealth: Payer: Self-pay

## 2013-01-30 NOTE — Telephone Encounter (Signed)
Pt was bitten by tick on rt hip 01/12/13; bite got red;pt's husband removed by tweezers; thinks got entire tick off, not positive; now itchy and raised area. No redness now, no rashes and no fever. Pt does not want to make appt until message sent to Dr Ermalene Searing for guidance. [facility].Please advise. Pt has been taking red yeast rice and scheduled fasting lab appt 02/04/13.

## 2013-01-30 NOTE — Telephone Encounter (Signed)
Pt notified of Dr. Daphine Deutscher recommendations and verbalized understanding

## 2013-01-30 NOTE — Telephone Encounter (Signed)
Sounds like local allergic reaction.  As long as no other rash, no joint pain, no  Fer, no confusion, no headache, no nexk stiffness... Then she does not need to be seen. Have her apply hydrocortisone cream OTC BID for next week.

## 2013-02-04 ENCOUNTER — Other Ambulatory Visit (INDEPENDENT_AMBULATORY_CARE_PROVIDER_SITE_OTHER): Payer: Medicare Other

## 2013-02-04 DIAGNOSIS — E785 Hyperlipidemia, unspecified: Secondary | ICD-10-CM

## 2013-02-04 LAB — COMPREHENSIVE METABOLIC PANEL
ALT: 20 U/L (ref 0–35)
AST: 22 U/L (ref 0–37)
Albumin: 4 g/dL (ref 3.5–5.2)
Alkaline Phosphatase: 47 U/L (ref 39–117)
Calcium: 9.3 mg/dL (ref 8.4–10.5)
Chloride: 105 mEq/L (ref 96–112)
Potassium: 3.7 mEq/L (ref 3.5–5.1)
Sodium: 140 mEq/L (ref 135–145)

## 2013-02-04 LAB — LIPID PANEL
Total CHOL/HDL Ratio: 3
VLDL: 24.2 mg/dL (ref 0.0–40.0)

## 2013-02-07 ENCOUNTER — Encounter: Payer: Self-pay | Admitting: *Deleted

## 2013-02-21 ENCOUNTER — Telehealth: Payer: Self-pay | Admitting: Family Medicine

## 2013-02-21 NOTE — Telephone Encounter (Signed)
Noted  

## 2013-02-21 NOTE — Telephone Encounter (Signed)
Message copied by Kerby Nora E on Fri Feb 21, 2013  1:04 AM ------      Message from: Consuello Masse      Created: Tue Feb 18, 2013 11:00 AM       Patient is taken red yeast rice 2400 mg a day. Fish oil 2400mg  a day. Patient says she eats a mediatrian diet. Patient says she will work even harder on her diet. ------

## 2013-03-20 ENCOUNTER — Ambulatory Visit (INDEPENDENT_AMBULATORY_CARE_PROVIDER_SITE_OTHER): Payer: Medicare Other | Admitting: Gynecology

## 2013-03-20 ENCOUNTER — Encounter: Payer: Self-pay | Admitting: Gynecology

## 2013-03-20 VITALS — BP 122/78

## 2013-03-20 DIAGNOSIS — M899 Disorder of bone, unspecified: Secondary | ICD-10-CM

## 2013-03-20 DIAGNOSIS — M8580 Other specified disorders of bone density and structure, unspecified site: Secondary | ICD-10-CM

## 2013-03-20 DIAGNOSIS — N952 Postmenopausal atrophic vaginitis: Secondary | ICD-10-CM

## 2013-03-20 DIAGNOSIS — Z79890 Hormone replacement therapy (postmenopausal): Secondary | ICD-10-CM

## 2013-03-20 DIAGNOSIS — N951 Menopausal and female climacteric states: Secondary | ICD-10-CM

## 2013-03-20 MED ORDER — ESTRADIOL 0.05 MG/24HR TD PTWK: 1.0000 | MEDICATED_PATCH | TRANSDERMAL | Status: DC

## 2013-03-20 NOTE — Progress Notes (Signed)
Kristin [name] 11/23/1940 [id number]        72 y.o.  [id number] for followup exam.  Former patient of Dr. Eda Paschal. Several issues noted below.  Past medical history,surgical history, medications, allergies, family history and social history were all reviewed and documented in the EPIC chart.  ROS:  Performed and pertinent positives and negatives are included in the history, assessment and plan .  Exam: Kim assistant Filed Vitals:   03/20/13 0838  BP: 122/78   General appearance  Normal Skin grossly normal Head/Neck normal with no cervical or supraclavicular adenopathy thyroid normal Lungs  clear Cardiac RR, without RMG Abdominal  soft, nontender, without masses, organomegaly or hernia Breasts  examined lying and sitting without masses, retractions, discharge or axillary adenopathy. Pelvic  Ext/BUS/vagina  normal with atrophic changes  Adnexa  Without masses or tenderness    Anus and perineum  normal   Rectovaginal  normal sphincter tone without palpated masses or tenderness.    Assessment/Plan:  72 y.o. Kristin [name] female for followup exam.   1. ERT. Patient on estradiol 0.05 mg patches. Has been on for a number of years. Status post LAVH BSO for endometriosis. Has tried stopping it several times this past year with unacceptable hot flushes and night sweats.  I reviewed the whole issue of HRT with her to include the WHI study with increased risk of stroke, heart attack, DVT and breast cancer. The ACOG and NAMS statements for lowest dose for the shortest period of time reviewed. Transdermal versus oral first-pass effect benefit discussed. I discussed the unusual circumstance of continuing in her 17s. She also has a strong cardiovascular history as outlined under her medical history. Increased risk of thromboses to include stroke heart attack DVT emphasized although the issues of HRT from the cardioprotective standpoint which started early was also reviewed. After lengthy discussion the patient  wants to continue she clearly understands the risks/benefits I refilled her Climara 0.5x1 year. 2. Atrophic genital changes. Patient asymptomatic and we'll continue to monitor. 3. Osteopenia. Patient has history of mild osteopenia in the past although her most recent DEXA 03/2012 was normal. Recommend following up presents with repeat DEXA in 5 years. Increase calcium vitamin D reviewed. 4. Pap smear 2011. No Pap smear done today. No history of abnormal Pap smears previously. Status post hysterectomy for benign indications. Discussed current screening guidelines we both agree to stop screening and she is comfortable with this. 5. Mammography 03/2012. Recommended repeat mammogram this month. SBE monthly reviewed. 6. Colonoscopy 2011. Repeat at their recommended interval. 7. Health maintenance. No lab work done this is all done through her primary physician's office. Followup one year, sooner as needed.  Note: This document was prepared with digital dictation and possible smart phrase technology. Any transcriptional errors that result from this process are unintentional.   Dara Lords MD, 9:36 AM 03/20/2013

## 2013-03-20 NOTE — Patient Instructions (Signed)
Follow-up in 1 year, sooner as needed. 

## 2013-04-09 ENCOUNTER — Other Ambulatory Visit: Payer: Self-pay

## 2013-04-09 NOTE — Telephone Encounter (Signed)
Pt left v/m requesting 3 month refill Tramadol to [facility].Please advise.

## 2013-04-10 MED ORDER — TRAMADOL HCL 50 MG PO TABS: 50.0000 mg | ORAL_TABLET | Freq: Three times a day (TID) | ORAL | Status: DC | PRN

## 2013-04-10 NOTE — Telephone Encounter (Signed)
Phoned in.

## 2013-04-11 ENCOUNTER — Ambulatory Visit: Payer: Self-pay | Admitting: Gynecology

## 2013-04-11 ENCOUNTER — Encounter: Payer: Self-pay | Admitting: Gynecology

## 2013-05-27 ENCOUNTER — Telehealth: Payer: Self-pay | Admitting: Family Medicine

## 2013-05-27 DIAGNOSIS — M25552 Pain in left hip: Secondary | ICD-10-CM

## 2013-05-27 NOTE — Telephone Encounter (Signed)
Order entered please fax. If pain not improving  with time make appt to be seen.

## 2013-05-27 NOTE — Telephone Encounter (Signed)
Patient Information:  Caller Name: Mahina  Phone: [phone]  Patient: Torre, Pikus  Gender: Female  DOB: 10-26-1940  Age: 72 Years  PCP: Kerby Nora (Family Practice)  Office Follow Up:  Does the office need to follow up with this patient?: Yes  Instructions For The Office: would like order sent for left hip xray sent to [facility] [phone], fax [phone]; please call if any problems  RN Note:  offered appt, but Stephannie says she only needs an xray and uses [facility]  Symptoms  Reason For Call & Symptoms: took a fall coming down a ramp around 1200; fell flat on her back; c/o slight discomfort to left left/femur; denies numbness/tingling; has driven and has been walking around the house; denies bruising/swelling; back has normal arthritis pain  Reviewed Health History In EMR: Yes  Reviewed Medications In EMR: Yes  Reviewed Allergies In EMR: Yes  Reviewed Surgeries / Procedures: Yes  Date of Onset of Symptoms: 05/27/2013  Guideline(s) Used:  Hip Injury  Disposition Per Guideline:   See Today or Tomorrow in Office  Reason For Disposition Reached:   High-risk adult (e.g., age > 44, osteoporosis, chronic steroid use)  Advice Given:  N/A  RN Overrode Recommendation:  Follow Up With Office Later  would like to have xray done

## 2013-05-27 NOTE — Telephone Encounter (Signed)
Orders faxed to [facility] [phone].  Ms. Paschen notified.

## 2013-06-13 ENCOUNTER — Encounter: Payer: Self-pay | Admitting: Family Medicine

## 2013-08-01 ENCOUNTER — Other Ambulatory Visit: Payer: Self-pay | Admitting: *Deleted

## 2013-08-01 MED ORDER — METOPROLOL SUCCINATE ER 25 MG PO TB24: 25.0000 mg | ORAL_TABLET | Freq: Every day | ORAL | Status: DC

## 2013-08-01 NOTE — Telephone Encounter (Signed)
Requested Prescriptions   Signed Prescriptions Disp Refills  . metoprolol succinate (TOPROL-XL) 25 MG 24 hr tablet 30 tablet 6    Sig: Take 1 tablet (25 mg total) by mouth daily.    Authorizing Provider: ARIDA, MUHAMMAD A    Ordering User: Tvisha Schwoerer C    

## 2013-09-26 ENCOUNTER — Other Ambulatory Visit: Payer: Self-pay | Admitting: *Deleted

## 2013-09-26 MED ORDER — TRAMADOL HCL 50 MG PO TABS: 50.0000 mg | ORAL_TABLET | Freq: Three times a day (TID) | ORAL | Status: DC | PRN

## 2013-09-26 NOTE — Telephone Encounter (Signed)
Pt requesting medication refill. Last ov 07/2012 with no future appts scheduled. pls advise

## 2013-09-26 NOTE — Telephone Encounter (Signed)
Schedule CPX   ASAP with labs prior, will refill  #90 until then.  I will not send in 90 days supply of pain med since not seen in last year.

## 2013-09-29 NOTE — Telephone Encounter (Signed)
Called to [facility]. I have asked Roselyn Reef to call and schedule patient a CPX with fasting labs prior.  I also told the pharmacist to advise patient that she needs an appointment prior to any additional refills.

## 2013-10-02 ENCOUNTER — Other Ambulatory Visit (INDEPENDENT_AMBULATORY_CARE_PROVIDER_SITE_OTHER): Payer: Medicare Other

## 2013-10-02 ENCOUNTER — Other Ambulatory Visit: Payer: Medicare Other

## 2013-10-02 ENCOUNTER — Telehealth: Payer: Self-pay | Admitting: Family Medicine

## 2013-10-02 DIAGNOSIS — E785 Hyperlipidemia, unspecified: Secondary | ICD-10-CM

## 2013-10-02 DIAGNOSIS — I10 Essential (primary) hypertension: Secondary | ICD-10-CM

## 2013-10-02 LAB — LIPID PANEL
CHOLESTEROL: 246 mg/dL — AB (ref 0–200)
HDL: 71.2 mg/dL (ref >39.00)
TRIGLYCERIDES: 121 mg/dL (ref 0.0–149.0)
Total CHOL/HDL Ratio: 3
VLDL: 24.2 mg/dL (ref 0.0–40.0)

## 2013-10-02 LAB — COMPREHENSIVE METABOLIC PANEL
ALT: 18 U/L (ref 0–35)
AST: 23 U/L (ref 0–37)
Albumin: 3.8 g/dL (ref 3.5–5.2)
Alkaline Phosphatase: 40 U/L (ref 39–117)
BILIRUBIN TOTAL: 0.6 mg/dL (ref 0.3–1.2)
BUN: 17 mg/dL (ref 6–23)
CALCIUM: 9.3 mg/dL (ref 8.4–10.5)
CHLORIDE: 103 meq/L (ref 96–112)
CO2: 27 meq/L (ref 19–32)
CREATININE: 1 mg/dL (ref 0.4–1.2)
GFR: 57.76 mL/min — AB (ref >60.00)
GLUCOSE: 83 mg/dL (ref 70–99)
Potassium: 3.8 mEq/L (ref 3.5–5.1)
Sodium: 138 mEq/L (ref 135–145)
Total Protein: 7.1 g/dL (ref 6.0–8.3)

## 2013-10-02 LAB — LDL CHOLESTEROL, DIRECT: Direct LDL: 158.1 mg/dL

## 2013-10-02 NOTE — Telephone Encounter (Signed)
Message copied by Jinny Sanders on Thu Oct 02, 2013  1:03 AM ------      Message from: Ellamae Sia      Created: Wed Oct 01, 2013  4:39 PM      Regarding: Lab orders for Thursday, 2.19.15       Patient is scheduled for CPX labs, please order future labs, Thanks , Terri       ------

## 2013-10-09 ENCOUNTER — Encounter: Payer: Medicare Other | Admitting: Family Medicine

## 2013-10-17 ENCOUNTER — Encounter: Payer: Self-pay | Admitting: Family Medicine

## 2013-10-17 ENCOUNTER — Ambulatory Visit (INDEPENDENT_AMBULATORY_CARE_PROVIDER_SITE_OTHER): Payer: Medicare Other | Admitting: Family Medicine

## 2013-10-17 VITALS — BP 120/80 | HR 54 | Temp 97.4°F | Ht 65.0 in | Wt 153.0 lb

## 2013-10-17 DIAGNOSIS — Z0000 Encounter for general adult medical examination without abnormal findings: Secondary | ICD-10-CM

## 2013-10-17 DIAGNOSIS — I10 Essential (primary) hypertension: Secondary | ICD-10-CM

## 2013-10-17 DIAGNOSIS — R413 Other amnesia: Secondary | ICD-10-CM

## 2013-10-17 DIAGNOSIS — N390 Urinary tract infection, site not specified: Secondary | ICD-10-CM | POA: Insufficient documentation

## 2013-10-17 DIAGNOSIS — E785 Hyperlipidemia, unspecified: Secondary | ICD-10-CM

## 2013-10-17 DIAGNOSIS — Z23 Encounter for immunization: Secondary | ICD-10-CM

## 2013-10-17 LAB — POCT URINALYSIS DIPSTICK
BILIRUBIN UA: NEGATIVE
Glucose, UA: NEGATIVE
KETONES UA: NEGATIVE
Nitrite, UA: NEGATIVE
PH UA: 6.5
SPEC GRAV UA: 1.01
Urobilinogen, UA: 0.2

## 2013-10-17 LAB — CBC WITH DIFFERENTIAL/PLATELET
BASOS ABS: 0.1 10*3/uL (ref 0.0–0.1)
Basophils Relative: 0.7 % (ref 0.0–3.0)
Eosinophils Absolute: 0.1 10*3/uL (ref 0.0–0.7)
Eosinophils Relative: 1.5 % (ref 0.0–5.0)
HEMATOCRIT: 41.1 % (ref 36.0–46.0)
HEMOGLOBIN: 13.6 g/dL (ref 12.0–15.0)
LYMPHS ABS: 1.5 10*3/uL (ref 0.7–4.0)
Lymphocytes Relative: 16.5 % (ref 12.0–46.0)
MCHC: 33.1 g/dL (ref 30.0–36.0)
MCV: 98.2 fl (ref 78.0–100.0)
MONO ABS: 0.6 10*3/uL (ref 0.1–1.0)
MONOS PCT: 7.1 % (ref 3.0–12.0)
NEUTROS ABS: 6.5 10*3/uL (ref 1.4–7.7)
Neutrophils Relative %: 74.2 % (ref 43.0–77.0)
PLATELETS: 257 10*3/uL (ref 150.0–400.0)
RBC: 4.18 Mil/uL (ref 3.87–5.11)
RDW: 14.3 % (ref 11.5–14.6)
WBC: 8.8 10*3/uL (ref 4.5–10.5)

## 2013-10-17 LAB — TSH: TSH: 0.5 u[IU]/mL (ref 0.35–5.50)

## 2013-10-17 LAB — VITAMIN B12: Vitamin B-12: 737 pg/mL (ref 211–911)

## 2013-10-17 MED ORDER — SULFAMETHOXAZOLE-TMP DS 800-160 MG PO TABS: 1.0000 | ORAL_TABLET | Freq: Two times a day (BID) | ORAL | Status: DC

## 2013-10-17 NOTE — Progress Notes (Signed)
Pre visit review using our clinic review tool, if applicable. No additional management support is needed unless otherwise documented below in the visit note. 

## 2013-10-17 NOTE — Progress Notes (Signed)
HPI  I have personally reviewed the Medicare Annual Wellness questionnaire and have noted  1. The patient's medical and social history  2. Their use of alcohol, tobacco or illicit drugs  3. Their current medications and supplements  4. The patient's functional ability including ADL's, fall risks, home safety risks and hearing or visual  impairment.  5. Diet and physical activities  6. Evidence for depression or mood disorders  The patients weight, height, BMI and visual acuity have been recorded in the chart  I have made referrals, counseling and provided education to the patient based review of the above and I have provided the pt with a written personalized care plan for preventive services.    In last 2 days ... Dysuria, urinary frequency, urgency.  No fever. NO new flank pain.  Hypertension: Well controlled on losartan, diltiazem Using medication without problems or lightheadedness:SE to ACEI, clonidine and BBlockers in past.  HCTZ stopped due to dizziness. She has had improvement in taking BP meds at dinner. BP Readings from Last 3 Encounters:  10/17/13 120/80  03/20/13 122/78  01/24/13 138/68  Chest pain with exertion:None  Edema:None  Short of breath:Mild with stairs, but stable  Average home RUE:AVWU low and some highs  Other issues:   Last OV from Dr. 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  At that [place] she complained of dizziness... HCTZ stopped, diltiazem decreased Target BP < 981 systolic recommended.  Elevated Cholesterol: On no medication.[name] Hesitant about statins in general.  LDL worsened and no longer at goal < 130 She Has " fallen off the wagon" Lab Results  Component Value Date   CHOL 246* 10/02/2013   HDL 71.20 10/02/2013   LDLCALC 113* 08/05/2012   LDLDIRECT 158.1 10/02/2013   TRIG 121.0 10/02/2013   CHOLHDL 3 10/02/2013   Using medications  without problems:None  Muscle aches: None  Diet compliance: Has otten off diet since thanksgiving. Exercise: Has stopped Other complaints: Hx of MI thought to be secondary to coronary spasm followed by Dr . Rockey Situ.  .  Wt Readings from Last 3 Encounters:  10/17/13 153 lb (69.4 kg)  01/24/13 146 lb (66.225 kg)  01/01/13 145 lb (65.772 kg)    Fibromyalgia/inflammatory arthritis: Per rheumatology Dr. Titus Mould, Good control on tramadol 50 mg daily, does make her somewhat sleepy.  No insomnia.  Also goes to acupuncturist.   HRT: with estradiol per Dr. Phineas Real. Has reviewed with him recently and has tried to stop patches but was not able to. She is on minimal dose.   Seeing Dr. Maureen Chatters for spleep apnea.  No longer seeing her given CPAP was not working for her. Dr Dohmeier discharged her.  Review of Systems  Constitutional: Positive for fatigue. Negative for fever and unexpected weight change.  HENT: Negative for ear pain, congestion, sore throat, sneezing, trouble swallowing and sinus pressure.  Eyes: Negative for pain and itching.  Respiratory: Negative for cough, shortness of breath and wheezing.  Cardiovascular: Negative for chest pain, palpitations and leg swelling.  Gastrointestinal: Negative for nausea, Some intermittant 3/10, abdominal pain x 1 year in left upper quadrant Related to after eating, some gas no bloating, has history of [name], probiotic helps with this., diarrhea, constipation and blood in stool.  Genitourinary: Negative for dysuria, hematuria, vaginal discharge, difficulty urinating and menstrual problem.  Skin: Negative for rash.  Neurological: Positive for dizziness. Negative for syncope, weakness, light-headedness,  numbness and headaches.  Psychiatric/Behavioral: Negative for confusion and dysphoric mood. The patient is not nervous/anxious.  Objective:   Physical Exam  Constitutional: Vital signs are normal. She appears well-developed and well-nourished. She is  cooperative. Non-toxic appearance. She does not appear ill. No distress.  HENT:  Head: Normocephalic.  Right Ear: Hearing, tympanic membrane, external ear and ear canal normal.  Left Ear: Hearing, tympanic membrane, external ear and ear canal normal.  Nose: Mucosal edema present.  Mouth/Throat: Uvula is midline, oropharynx is clear and moist and mucous membranes are normal.  Eyes: Conjunctivae, EOM and lids are normal. Pupils are equal, round, and reactive to light. No foreign bodies found.  Neck: Trachea normal and normal range of motion. Neck supple. Carotid bruit is not present. No mass and no thyromegaly present.  Cardiovascular: Normal rate, regular rhythm, S1 normal, S2 normal, normal heart sounds and intact distal pulses. Exam reveals no gallop.  No murmur heard.  Pulmonary/Chest: Effort normal and breath sounds normal. No respiratory distress. She has no wheezes. She has no rhonchi. She has no rales.  Abdominal: Soft. Normal appearance and bowel sounds are normal. She exhibits no distension, no fluid wave, no abdominal bruit and no mass. There is no hepatosplenomegaly.mild tenderness in left upper quadrant. There is no rebound, no guarding and no CVA tenderness. No hernia.  Lymphadenopathy:  She has no cervical adenopathy.  She has no axillary adenopathy.  Neurological: She is alert. She has normal strength. No cranial nerve deficit or sensory deficit.  Skin: Skin is warm, dry and intact. No rash noted.  Multiple moles, no concerning skin lesions  Psychiatric: Her speech is normal and behavior is normal. Judgment normal. Her mood appears not anxious. Cognition and memory are normal. She does not exhibit a depressed mood.  Assessment & Plan:   AMW: The patient's preventative maintenance and recommended screening tests for an annual wellness exam were reviewed in full today.  Brought up to date unless services declined.  Counselled on the importance of diet, exercise, and its role in  overall health and mortality.  The patient's FH and SH was reviewed, including their home life, tobacco status, and drug and alcohol status.   Vaccines: uptodate with TD, zoster, PNA, Flu,  Due for prevnar Mammogram: 03/2013 nml  DVE/pap: not indicated, given age and S/P vaginal hysterectomy for noncancer reason , sees GYN  Colon: 2011.. Nml, likely no repeat needed.  DEXA: 03/2011 at GYN nml. Nonsmoker

## 2013-10-17 NOTE — Addendum Note (Signed)
Addended by: Carter Kitten on: 10/17/2013 09:59 AM   Modules accepted: Orders

## 2013-10-17 NOTE — Patient Instructions (Addendum)
Get back on track with healthy eating and exercise. Stop at lab on way out for memory lab tests. Treat UTI with 3 days of antibiotics, push fluids.  If symptoms not resolved... Return for recheck of urine.

## 2013-10-17 NOTE — Assessment & Plan Note (Signed)
  UA positive, not enough urine to spin or culture. Uncomplicated. Treat with 3 days of  Bactrim.  If not improving return for sample for culture.

## 2013-10-18 ENCOUNTER — Telehealth: Payer: Self-pay | Admitting: Family Medicine

## 2013-10-18 LAB — VITAMIN D 25 HYDROXY (VIT D DEFICIENCY, FRACTURES): Vit D, 25-Hydroxy: 70 ng/mL (ref 30–89)

## 2013-10-18 LAB — RPR

## 2013-10-18 NOTE — Telephone Encounter (Signed)
Relevant patient education assigned to patient using Emmi. ° °

## 2013-10-20 ENCOUNTER — Telehealth: Payer: Self-pay | Admitting: Family Medicine

## 2013-10-20 NOTE — Telephone Encounter (Signed)
Relevant patient education assigned to patient using Emmi. ° °

## 2013-12-25 ENCOUNTER — Other Ambulatory Visit: Payer: Self-pay | Admitting: *Deleted

## 2013-12-25 MED ORDER — TRAMADOL HCL 50 MG PO TABS: 50.0000 mg | ORAL_TABLET | Freq: Three times a day (TID) | ORAL | Status: DC | PRN

## 2013-12-25 NOTE — Telephone Encounter (Signed)
Last office visit 10/17/2013.  Last refilled 09/26/2013 for #90.  Ok to refill?

## 2013-12-26 NOTE — Telephone Encounter (Signed)
Called to [facility]. 

## 2014-01-06 ENCOUNTER — Other Ambulatory Visit: Payer: Self-pay

## 2014-01-06 MED ORDER — DILTIAZEM HCL ER 120 MG PO CP12: 120.0000 mg | ORAL_CAPSULE | Freq: Every day | ORAL | Status: DC

## 2014-01-06 MED ORDER — LOSARTAN POTASSIUM 25 MG PO TABS: 25.0000 mg | ORAL_TABLET | Freq: Every day | ORAL | Status: DC

## 2014-01-06 NOTE — Telephone Encounter (Signed)
Refill sent for Losartan  

## 2014-01-06 NOTE — Telephone Encounter (Signed)
Refill sent for diltiazem 120 mg  

## 2014-01-12 ENCOUNTER — Encounter: Payer: Self-pay | Admitting: Internal Medicine

## 2014-01-12 ENCOUNTER — Ambulatory Visit (INDEPENDENT_AMBULATORY_CARE_PROVIDER_SITE_OTHER): Payer: Medicare Other | Admitting: Internal Medicine

## 2014-01-12 VITALS — BP 108/76 | HR 54 | Temp 98.1°F | Wt 153.0 lb

## 2014-01-12 DIAGNOSIS — N390 Urinary tract infection, site not specified: Secondary | ICD-10-CM

## 2014-01-12 DIAGNOSIS — R300 Dysuria: Secondary | ICD-10-CM

## 2014-01-12 LAB — POCT URINALYSIS DIPSTICK
BILIRUBIN UA: NEGATIVE
GLUCOSE UA: NEGATIVE
Ketones, UA: NEGATIVE
NITRITE UA: POSITIVE
Spec Grav, UA: <=1.005
Urobilinogen, UA: 0.2
pH, UA: 7.5

## 2014-01-12 MED ORDER — NITROFURANTOIN MONOHYD MACRO 100 MG PO CAPS: 100.0000 mg | ORAL_CAPSULE | Freq: Two times a day (BID) | ORAL | Status: DC

## 2014-01-12 NOTE — Progress Notes (Signed)
HPI  Pt presents to the clinic today with c/o urgency, frequency and dysuria. She reports this started 1 week ago. She denies fever, chills, nausea. She has had some associated left flank pain. She has also noticed blood on the tissue when she wipes. She has not tried anything OTC.   Review of Systems  Past Medical History  Diagnosis Date  . Rosacea   . Allergic rhinitis, cause unspecified   . Degeneration of lumbar or lumbosacral intervertebral disc   . Myalgia and myositis, unspecified   . Other and unspecified hyperlipidemia   . Old myocardial infarction 1996  . Obstructive sleep apnea (adult) (pediatric)   . Osteoarthrosis, unspecified whether generalized or localized, unspecified site   . Abdominal pain, right upper quadrant   . Rash and other nonspecific skin eruption   . PMB (postmenopausal bleeding)   . Shingles   . Stroke     ischaemic microvascular disease  . MI (mitral incompetence)     1996,cardiac related dizziness, fainting PVC's  . SAH (subarachnoid hemorrhage) 1991  . Arthritis     recent falls -aug 2010  . Unspecified essential hypertension     without LVH  . Pain     CHRONIC  . [name] (coronary artery disease)     mi-1996  . Subarachnoid hemorrhage due to ruptured aneurysm     1991 , bleed and dizziness     Family History  Problem Relation Age of Onset  . Colon cancer Father   . Heart attack Mother   . Coronary artery disease Mother   . Osteoporosis Mother   . Heart disease Mother   . Heart failure Mother   . GER disease Sister   . Hypertension Sister   . Osteopenia Sister   . Breast cancer Paternal Aunt     age 91  . Heart failure Maternal Grandmother   . Heart failure Maternal Grandfather     History   Social History  . Marital Status: Married    Spouse Name: N/A    Number of Children: 2  . Years of Education: BSN   Occupational History  . 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     Social History Main Topics  . Smoking  status: Never Smoker   . Smokeless tobacco: Never Used  . Alcohol Use: Yes     Comment: one glass of wine per month  . Drug Use: No  . Sexual Activity: No   Other Topics Concern  . Not on file   Social History Narrative   No regular exercise-used to do [place] will.. She is DNR, [name] is husband [name] , then son [name]       10/22/12-cardiac work up found the reason for her dizziness,  she needs no neurological follow up for that.  her enthusiasm for CPAP remains limited and is explained by her resiudal high AHI after she finally reached compliance ,Epworth 13 points.     95% pressure is 11.7 cm , AHI  20 .7- she does not benefit from CPAP . She is allowed to d/c the machine and we need to concerntrate on her dizziness and facial right droop. ordered MRI brain repeat.    Eye exam next week. PT at [facility]. MRI brain in April- per patients request order next month.              Allergies  Allergen Reactions  . Ace Inhibitors     REACTION: Boils  . Angiotensin Receptor Blockers   . Latex     REACTION: Rash, blisters, breathing probs.  . Penicillins     REACTION: Hives, swellling    Constitutional: Denies fever, malaise, fatigue, headache or abrupt weight changes.   GU: Pt reports urgency, frequency and pain with urination. Denies burning sensation, odor or discharge. Skin: Denies redness, rashes, lesions or ulcercations.   No other specific complaints in a complete review of systems (except as listed in HPI above).    Objective:   Physical Exam  BP 108/76  Pulse 54  Temp(Src) 98.1 F (36.7 C) (Oral)  Wt 153 lb (69.4 kg)  SpO2 97%  LMP 08/14/1989 Wt Readings from Last 3 Encounters:  01/12/14 153 lb (69.4 kg)  10/17/13 153 lb (69.4 kg)  01/24/13 146 lb (66.225 kg)    General: Appears her stated age, well developed, well nourished in NAD. Cardiovascular: Normal rate and rhythm. S1,S2 noted.  No murmur, rubs or gallops  noted. No JVD or BLE edema. No carotid bruits noted. Pulmonary/Chest: Normal effort and positive vesicular breath sounds. No respiratory distress. No wheezes, rales or ronchi noted.  Abdomen: Soft and nontender. Normal bowel sounds, no bruits noted. No distention or masses noted. Liver, spleen and kidneys non palpable. Tender to palpation over the bladder area. No CVA tenderness.       Assessment & Plan:   Urgency, Frequency, Dysuria secondary to UTI:  Urinalysis: mod leuks, large blood, pos nitrites eRx sent if for Macrobid 100 mg BID x 5 days OK to take AZO OTC Drink plenty of fluids  RTC as needed or if symptoms persist.

## 2014-01-12 NOTE — Patient Instructions (Addendum)
Urinary Tract Infection  Urinary tract infections (UTIs) can develop anywhere along your urinary tract. Your urinary tract is your body's drainage system for removing wastes and extra water. Your urinary tract includes two kidneys, two ureters, a bladder, and a urethra. Your kidneys are a pair of bean-shaped organs. Each kidney is about the size of your fist. They are located below your ribs, one on each side of your spine.  CAUSES  Infections are caused by microbes, which are microscopic organisms, including fungi, viruses, and bacteria. These organisms are so small that they can only be seen through a microscope. Bacteria are the microbes that most commonly cause UTIs.  SYMPTOMS   Symptoms of UTIs may vary by age and gender of the patient and by the location of the infection. Symptoms in young women typically include a frequent and intense urge to urinate and a painful, burning feeling in the bladder or urethra during urination. Older women and men are more likely to be tired, shaky, and weak and have muscle aches and abdominal pain. A fever may mean the infection is in your kidneys. Other symptoms of a kidney infection include pain in your back or sides below the ribs, nausea, and vomiting.  DIAGNOSIS  To diagnose a UTI, your caregiver will ask you about your symptoms. Your caregiver also will ask to provide a urine sample. The urine sample will be tested for bacteria and white blood cells. White blood cells are made by your body to help fight infection.  TREATMENT   Typically, UTIs can be treated with medication. Because most UTIs are caused by a bacterial infection, they usually can be treated with the use of antibiotics. The choice of antibiotic and length of treatment depend on your symptoms and the type of bacteria causing your infection.  HOME CARE INSTRUCTIONS   If you were prescribed antibiotics, take them exactly as your caregiver instructs you. Finish the medication even if you feel better after you  have only taken some of the medication.   Drink enough water and fluids to keep your urine clear or pale yellow.   Avoid caffeine, tea, and carbonated beverages. They tend to irritate your bladder.   Empty your bladder often. Avoid holding urine for long periods of time.   Empty your bladder before and after sexual intercourse.   After a bowel movement, women should cleanse from front to back. Use each tissue only once.  SEEK MEDICAL CARE IF:    You have back pain.   You develop a fever.   Your symptoms do not begin to resolve within 3 days.  SEEK IMMEDIATE MEDICAL CARE IF:    You have severe back pain or lower abdominal pain.   You develop chills.   You have nausea or vomiting.   You have continued burning or discomfort with urination.  MAKE SURE YOU:    Understand these instructions.   Will watch your condition.   Will get help right away if you are not doing well or get worse.  Document Released: 05/10/2005 Document Revised: 01/30/2012 Document Reviewed: 09/08/2011  ExitCare Patient Information 2014 [employer].

## 2014-01-12 NOTE — Progress Notes (Signed)
Pre visit review using our clinic review tool, if applicable. No additional management support is needed unless otherwise documented below in the visit note. 

## 2014-01-12 NOTE — Addendum Note (Signed)
Addended by: Lurlean Nanny on: 01/12/2014 02:15 PM   Modules accepted: Orders

## 2014-01-22 ENCOUNTER — Encounter: Payer: Self-pay | Admitting: Internal Medicine

## 2014-01-22 ENCOUNTER — Ambulatory Visit (INDEPENDENT_AMBULATORY_CARE_PROVIDER_SITE_OTHER): Payer: Medicare Other | Admitting: Internal Medicine

## 2014-01-22 ENCOUNTER — Encounter: Payer: Self-pay | Admitting: Family Medicine

## 2014-01-22 VITALS — BP 116/68 | HR 58 | Temp 98.2°F | Wt 152.0 lb

## 2014-01-22 DIAGNOSIS — N390 Urinary tract infection, site not specified: Secondary | ICD-10-CM

## 2014-01-22 DIAGNOSIS — R300 Dysuria: Secondary | ICD-10-CM

## 2014-01-22 LAB — POCT URINALYSIS DIPSTICK
Bilirubin, UA: NEGATIVE
GLUCOSE UA: NEGATIVE
Ketones, UA: NEGATIVE
NITRITE UA: NEGATIVE
Spec Grav, UA: 1.01
UROBILINOGEN UA: 0.2
pH, UA: 7.5

## 2014-01-22 MED ORDER — CIPROFLOXACIN HCL 500 MG PO TABS: 500.0000 mg | ORAL_TABLET | Freq: Two times a day (BID) | ORAL | Status: DC

## 2014-01-22 NOTE — Progress Notes (Signed)
HPI  Pt presents to the clinic today with c/o dysuria. She reports this started 4 days ago. She was recently treated for UTI 01/12/14 with macrobid. There was not enough urine at that time to send a culture. Her symptoms returned 1 week after finishing her course of Macrobid. She denies fever, chills, nausea or low back pain.   Review of Systems  Past Medical History  Diagnosis Date  . Rosacea   . Allergic rhinitis, cause unspecified   . Degeneration of lumbar or lumbosacral intervertebral disc   . Myalgia and myositis, unspecified   . Other and unspecified hyperlipidemia   . Old myocardial infarction 1996  . Obstructive sleep apnea (adult) (pediatric)   . Osteoarthrosis, unspecified whether generalized or localized, unspecified site   . Abdominal pain, right upper quadrant   . Rash and other nonspecific skin eruption   . PMB (postmenopausal bleeding)   . Shingles   . Stroke     ischaemic microvascular disease  . MI (mitral incompetence)     1996,cardiac related dizziness, fainting PVC's  . SAH (subarachnoid hemorrhage) 1991  . Arthritis     recent falls -aug 2010  . Unspecified essential hypertension     without LVH  . Pain     CHRONIC  . [name] (coronary artery disease)     mi-1996  . Subarachnoid hemorrhage due to ruptured aneurysm     1991 , bleed and dizziness     Family History  Problem Relation Age of Onset  . Colon cancer Father   . Heart attack Mother   . Coronary artery disease Mother   . Osteoporosis Mother   . Heart disease Mother   . Heart failure Mother   . GER disease Sister   . Hypertension Sister   . Osteopenia Sister   . Breast cancer Paternal Aunt     age 52  . Heart failure Maternal Grandmother   . Heart failure Maternal Grandfather     History   Social History  . Marital Status: Married    Spouse Name: N/A    Number of Children: 2  . Years of Education: BSN   Occupational History  . 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      Social History Main Topics  . Smoking status: Never Smoker   . Smokeless tobacco: Never Used  . Alcohol Use: Yes     Comment: one glass of wine per month  . Drug Use: No  . Sexual Activity: No   Other Topics Concern  . Not on file   Social History Narrative   No regular exercise-used to do [place] will.. She is DNR, [name] is husband [name] , then son [name]       10/22/12-cardiac work up found the reason for her dizziness,  she needs no neurological follow up for that.  her enthusiasm for CPAP remains limited and is explained by her resiudal high AHI after she finally reached compliance ,Epworth 13 points.     95% pressure is 11.7 cm , AHI  20 .7- she does not benefit from CPAP . She is allowed to d/c the machine and we need to concerntrate on her dizziness and facial right droop. ordered MRI brain repeat.    Eye exam next week. PT at [facility]. MRI brain in April- per patients request order next month.  Allergies  Allergen Reactions  . Ace Inhibitors     REACTION: Boils  . Angiotensin Receptor Blockers   . Latex     REACTION: Rash, blisters, breathing probs.  . Penicillins     REACTION: Hives, swellling    Constitutional: Denies fever, malaise, fatigue, headache or abrupt weight changes.   GU: Pt reports urgency, frequency and pain with urination. Denies burning sensation, blood in urine, odor or discharge. Skin: Denies redness, rashes, lesions or ulcercations.   No other specific complaints in a complete review of systems (except as listed in HPI above).    Objective:   Physical Exam  BP 116/68  Pulse 58  Temp(Src) 98.2 F (36.8 C) (Oral)  Wt 152 lb (68.947 kg)  SpO2 98%  LMP 08/14/1989 Wt Readings from Last 3 Encounters:  01/22/14 152 lb (68.947 kg)  01/12/14 153 lb (69.4 kg)  10/17/13 153 lb (69.4 kg)    General: Appears her stated age, well developed, well nourished in NAD. Cardiovascular: Normal  rate and rhythm. S1,S2 noted.  No murmur, rubs or gallops noted. No JVD or BLE edema. No carotid bruits noted. Pulmonary/Chest: Normal effort and positive vesicular breath sounds. No respiratory distress. No wheezes, rales or ronchi noted.  Abdomen: Soft and nontender. Normal bowel sounds, no bruits noted. No distention or masses noted. Liver, spleen and kidneys non palpable. Tender to palpation over the bladder area. No CVA tenderness.      Assessment & Plan:   Urgency, Frequency, Dysuria secondary to UTI  Urinalysis: large blood, mod leuks Will send urine culture today eRx sent if for Cipro 500 mg BID x 7 days OK to take AZO OTC Drink plenty of fluids  RTC as needed or if symptoms persist.

## 2014-01-22 NOTE — Patient Instructions (Addendum)
Urinary Tract Infection  Urinary tract infections (UTIs) can develop anywhere along your urinary tract. Your urinary tract is your body's drainage system for removing wastes and extra water. Your urinary tract includes two kidneys, two ureters, a bladder, and a urethra. Your kidneys are a pair of bean-shaped organs. Each kidney is about the size of your fist. They are located below your ribs, one on each side of your spine.  CAUSES  Infections are caused by microbes, which are microscopic organisms, including fungi, viruses, and bacteria. These organisms are so small that they can only be seen through a microscope. Bacteria are the microbes that most commonly cause UTIs.  SYMPTOMS   Symptoms of UTIs may vary by age and gender of the patient and by the location of the infection. Symptoms in young women typically include a frequent and intense urge to urinate and a painful, burning feeling in the bladder or urethra during urination. Older women and men are more likely to be tired, shaky, and weak and have muscle aches and abdominal pain. A fever may mean the infection is in your kidneys. Other symptoms of a kidney infection include pain in your back or sides below the ribs, nausea, and vomiting.  DIAGNOSIS  To diagnose a UTI, your caregiver will ask you about your symptoms. Your caregiver also will ask to provide a urine sample. The urine sample will be tested for bacteria and white blood cells. White blood cells are made by your body to help fight infection.  TREATMENT   Typically, UTIs can be treated with medication. Because most UTIs are caused by a bacterial infection, they usually can be treated with the use of antibiotics. The choice of antibiotic and length of treatment depend on your symptoms and the type of bacteria causing your infection.  HOME CARE INSTRUCTIONS   If you were prescribed antibiotics, take them exactly as your caregiver instructs you. Finish the medication even if you feel better after you  have only taken some of the medication.   Drink enough water and fluids to keep your urine clear or pale yellow.   Avoid caffeine, tea, and carbonated beverages. They tend to irritate your bladder.   Empty your bladder often. Avoid holding urine for long periods of time.   Empty your bladder before and after sexual intercourse.   After a bowel movement, women should cleanse from front to back. Use each tissue only once.  SEEK MEDICAL CARE IF:    You have back pain.   You develop a fever.   Your symptoms do not begin to resolve within 3 days.  SEEK IMMEDIATE MEDICAL CARE IF:    You have severe back pain or lower abdominal pain.   You develop chills.   You have nausea or vomiting.   You have continued burning or discomfort with urination.  MAKE SURE YOU:    Understand these instructions.   Will watch your condition.   Will get help right away if you are not doing well or get worse.  Document Released: 05/10/2005 Document Revised: 01/30/2012 Document Reviewed: 09/08/2011  ExitCare Patient Information 2014 [employer].

## 2014-01-22 NOTE — Progress Notes (Signed)
Pre visit review using our clinic review tool, if applicable. No additional management support is needed unless otherwise documented below in the visit note. 

## 2014-01-24 LAB — URINE CULTURE
COLONY COUNT: NO GROWTH
Organism ID, Bacteria: NO GROWTH

## 2014-01-30 ENCOUNTER — Ambulatory Visit (INDEPENDENT_AMBULATORY_CARE_PROVIDER_SITE_OTHER): Payer: Medicare Other | Admitting: Cardiovascular Disease

## 2014-01-30 ENCOUNTER — Encounter: Payer: Self-pay | Admitting: Cardiovascular Disease

## 2014-01-30 VITALS — BP 168/84 | HR 60 | Ht 66.0 in | Wt 150.8 lb

## 2014-01-30 DIAGNOSIS — I10 Essential (primary) hypertension: Secondary | ICD-10-CM

## 2014-01-30 DIAGNOSIS — I493 Ventricular premature depolarization: Secondary | ICD-10-CM

## 2014-01-30 DIAGNOSIS — I4949 Other premature depolarization: Secondary | ICD-10-CM

## 2014-01-30 NOTE — Patient Instructions (Signed)
Continue same medications.   Your physician wants you to follow-up in: 1 year.  You will receive a reminder letter in the mail two months in advance. If you don't receive a letter, please call our office to schedule the follow-up appointment.  

## 2014-01-30 NOTE — Progress Notes (Signed)
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  She had cardiac catheterization in 05/2012 which showed no significant [name]. She has known palpitations due to PVCs and ventricular bigeminy.  She has been doing well from a cardiac standpoint overall with no significant palpitations. PVCs are controlled with Toprol and diltiazem. Home blood pressure readings are mostly optimal.  Allergies  Allergen Reactions  . Ace Inhibitors     REACTION: Boils  . Angiotensin Receptor Blockers   . Latex     REACTION: Rash, blisters, breathing probs.  . Penicillins     REACTION: Hives, swellling     Current Outpatient Prescriptions on File Prior to Visit  Medication Sig Dispense Refill  . acetaminophen (TYLENOL) 325 MG tablet Take 325 mg by mouth every 6 (six) hours as needed.       [name] aspirin EC 81 MG tablet Take 81 mg by mouth daily.      . Calcium Carbonate-Vitamin D (CALTRATE 600+D PO) Take by mouth daily.      . Coenzyme Q10 200 MG capsule Take 200 mg by mouth daily.        [name] diltiazem (CARDIZEM SR) 120 MG 12 hr capsule Take 1 capsule (120 mg total) by mouth daily.  30 capsule  11  . Doxylamine Succinate, Sleep, (UNISOM) 25 MG tablet Take 12.5 mg by mouth at bedtime as needed.        [name] losartan (COZAAR) 25 MG tablet Take 1 tablet (25 mg total) by mouth daily.  30 tablet  11  . Magnesium 250 MG TABS Take by mouth daily.      . metoprolol succinate (TOPROL XL) 25 MG 24 hr tablet Take 1 tablet (25 mg total) by mouth daily.  30 tablet  6  . MetroNIDAZOLE (METROGEL EX) Apply topically as needed.      . Multiple Vitamin (MULTIVITAMIN) tablet Take 1 tablet by mouth daily.        . Omega-3 Fatty Acids (FISH OIL) 1200 MG CAPS Take 1 capsule by mouth 2 (two) times daily.        Vladimir Faster Glycol-Propyl Glycol (SYSTANE) 0.4-0.3 % SOLN Apply to eye as directed.        . Probiotic Product (PROBIOTIC PO) Take 1 capsule by mouth daily.        . Thiamine HCl (VITAMIN B-1) 250 MG tablet Take 250 mg by mouth daily.        . traMADol (ULTRAM) 50 MG tablet Take 1 tablet (50 mg total) by mouth every 8 (eight) hours as needed.  90 tablet  0  . triamcinolone (KENALOG) 0.5 % cream Apply 1 application topically as needed.        No current facility-administered medications on file prior to visit.     Past Medical History  Diagnosis Date  . Rosacea   . Allergic rhinitis, cause unspecified   . Degeneration of lumbar or lumbosacral intervertebral disc   . Myalgia and myositis, unspecified   . Other and unspecified hyperlipidemia   . Old myocardial infarction 1996  . Obstructive sleep apnea (adult) (pediatric)   . Osteoarthrosis, unspecified whether generalized or localized, unspecified site   . Abdominal pain, right upper quadrant   . Rash and other nonspecific skin eruption   . PMB (  postmenopausal bleeding)   . Shingles   . Stroke     ischaemic microvascular disease  . MI (mitral incompetence)     1996,cardiac related dizziness, fainting PVC's  . SAH (subarachnoid hemorrhage) 1991  . Arthritis     recent falls -aug 2010  . Unspecified essential hypertension     without LVH  . Pain     CHRONIC  . [name] (coronary artery disease)     mi-1996  . Subarachnoid hemorrhage due to ruptured aneurysm     1991 , bleed and dizziness      Past Surgical History  Procedure Laterality Date  . Ethmoidectomy  1998  . Sinus biopsy  1998    inverted papiloma  . Ct abd wo/w & pelvis wo cm  2013    no acute process, + minimal diverticulosis and hepatic/renal cysts, ATH calcifications  . Oophorectomy      BSO  . Cardiac catheterization  1996    vasospams; no blockage  . Cardiac catheterization  2013    No obstructive coronary artery disease. Mild distal LAD and proximal RCA  disease.  . Atherectomy  1996  . Cataract extraction    . Nasal sinus surgery  1998  . Vaginal hysterectomy  1992    for mennorhagia-LAVH     Family History  Problem Relation Age of Onset  . Colon cancer Father   . Heart attack Mother   . Coronary artery disease Mother   . Osteoporosis Mother   . Heart disease Mother   . Heart failure Mother   . GER disease Sister   . Hypertension Sister   . Osteopenia Sister   . Breast cancer Paternal Aunt     age 33  . Heart failure Maternal Grandmother   . Heart failure Maternal Grandfather      History   Social History  . Marital Status: Married    Spouse Name: N/A    Number of Children: 2  . Years of Education: BSN   Occupational History  . 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     Social History Main Topics  . Smoking status: Never Smoker   . Smokeless tobacco: Never Used  . Alcohol Use: No     Comment: one glass of wine per month  . Drug Use: No  . Sexual Activity: No   Other Topics Concern  . Not on file   Social History Narrative   No regular exercise-used to do [place] will.. She is DNR, [name] is husband [name] , then son [name]       10/22/12-cardiac work up found the reason for her dizziness,  she needs no neurological follow up for that.  her enthusiasm for CPAP remains limited and is explained by her resiudal high AHI after she finally reached compliance ,Epworth 13 points.     95% pressure is 11.7 cm , AHI  20 .7- she does not benefit from CPAP . She is allowed to d/c the machine and we need to concerntrate on her dizziness and facial right droop. ordered MRI brain repeat.    Eye exam next week. PT at [facility]. MRI brain in April- per patients request order next month.                PHYSICAL EXAM   BP 168/84  Pulse 60  Ht 5\' 6"  (1.676 m)  Wt  150 lb 12 oz (68.38 kg)  BMI 24.34 kg/m2  LMP 08/14/1989 Constitutional: She is oriented to person, place,  and time. She appears well-developed and well-nourished. No distress.  HENT: No nasal discharge.  Head: Normocephalic and atraumatic.  Eyes: Pupils are equal and round. Right eye exhibits no discharge. Left eye exhibits no discharge.  Neck: Normal range of motion. Neck supple. No JVD present. No thyromegaly present.  Cardiovascular: Normal rate, regular rhythm , normal heart sounds. Exam reveals no gallop and no friction rub. No murmur heard.  Pulmonary/Chest: Effort normal and breath sounds normal. No stridor. No respiratory distress. She has no wheezes. She has no rales. She exhibits no tenderness.  Abdominal: Soft. Bowel sounds are normal. She exhibits no distension. There is no tenderness. There is no rebound and no guarding.  Musculoskeletal: Normal range of motion. She exhibits no edema and no tenderness.  Neurological: She is alert and oriented to person, place, and time. Coordination normal.  Skin: Skin is warm and dry. No rash noted. She is not diaphoretic. No erythema. No pallor.  Psychiatric: She has a normal mood and affect. Her behavior is normal. Judgment and thought content normal.    EKG: Sinus  Rhythm  -RSR(V1) -nondiagnostic.   PROBABLY NORMAL  ASSESSMENT AND PLAN

## 2014-01-31 ENCOUNTER — Encounter: Payer: Self-pay | Admitting: Cardiovascular Disease

## 2014-01-31 NOTE — Assessment & Plan Note (Signed)
Symptoms are overall well controlled with diltiazem and metoprolol. No changes were made today.

## 2014-01-31 NOTE — Assessment & Plan Note (Signed)
Blood pressure is elevated today. However, home blood pressure readings are mostly optimal. Continue current medications.

## 2014-02-26 ENCOUNTER — Other Ambulatory Visit: Payer: Self-pay

## 2014-02-26 MED ORDER — METOPROLOL SUCCINATE ER 25 MG PO TB24: 25.0000 mg | ORAL_TABLET | Freq: Every day | ORAL | Status: DC

## 2014-02-26 NOTE — Telephone Encounter (Signed)
Refill sent for metoprolol.  

## 2014-02-27 ENCOUNTER — Other Ambulatory Visit: Payer: Self-pay | Admitting: *Deleted

## 2014-02-27 MED ORDER — TRAMADOL HCL 50 MG PO TABS: 50.0000 mg | ORAL_TABLET | Freq: Three times a day (TID) | ORAL | Status: DC | PRN

## 2014-02-27 NOTE — Telephone Encounter (Signed)
Last office visit 01/22/2014 with Webb Silversmith.  Last office visit 12/25/2013 for #90 with no refills.  Ok to refill?

## 2014-02-27 NOTE — Telephone Encounter (Signed)
Called to [facility]. 

## 2014-03-30 ENCOUNTER — Other Ambulatory Visit: Payer: Self-pay | Admitting: *Deleted

## 2014-03-30 NOTE — Telephone Encounter (Signed)
#  90x0 last filled 03/05/14. Last cpx was in 3/15, no other scheduled appts. Ok to fill?

## 2014-03-31 ENCOUNTER — Telehealth: Payer: Self-pay | Admitting: *Deleted

## 2014-03-31 ENCOUNTER — Encounter: Payer: Self-pay | Admitting: Gynecology

## 2014-03-31 ENCOUNTER — Ambulatory Visit (INDEPENDENT_AMBULATORY_CARE_PROVIDER_SITE_OTHER): Payer: Medicare Other | Admitting: Gynecology

## 2014-03-31 VITALS — BP 122/78 | Ht 65.0 in | Wt 150.0 lb

## 2014-03-31 DIAGNOSIS — N8111 Cystocele, midline: Secondary | ICD-10-CM

## 2014-03-31 DIAGNOSIS — N952 Postmenopausal atrophic vaginitis: Secondary | ICD-10-CM

## 2014-03-31 DIAGNOSIS — N951 Menopausal and female climacteric states: Secondary | ICD-10-CM

## 2014-03-31 MED ORDER — NONFORMULARY OR COMPOUNDED ITEM: Status: DC

## 2014-03-31 MED ORDER — TRAMADOL HCL 50 MG PO TABS: 50.0000 mg | ORAL_TABLET | Freq: Three times a day (TID) | ORAL | Status: DC | PRN

## 2014-03-31 NOTE — Progress Notes (Signed)
Kristin [name] 05-Aug-1941 [id number]        73 y.o.  [id number] for followup exam. Several issues noted below.  Past medical history,surgical history, problem list, medications, allergies, family history and social history were all reviewed and documented as reviewed in the EPIC chart.  ROS:  12 system ROS performed with pertinent positives and negatives included in the history, assessment and plan.   Additional significant findings :  None   Exam: Kim Counsellor Vitals:   03/31/14 0902  BP: 122/78  Height: 5\' 5"  (1.651 m)  Weight: 150 lb (68.04 kg)   General appearance:  Normal affect, orientation and appearance. Skin: Grossly normal HEENT: Without gross lesions.  No cervical or supraclavicular adenopathy. Thyroid normal.  Lungs:  Clear without wheezing, rales or rhonchi Cardiac: RR, without RMG Abdominal:  Soft, nontender, without masses, guarding, rebound, organomegaly or hernia Breasts:  Examined lying and sitting without masses, retractions, discharge or axillary adenopathy. Pelvic:  Ext/BUS/vagina with generalized atrophic changes. Mild cystocele noted. Cuff well supported.  Adnexa  Without masses or tenderness    Anus and perineum  Normal   Rectovaginal  Normal sphincter tone without palpated masses or tenderness.    Assessment/Plan:  73 y.o. [id number] female for followup exam.   1. Postmenopausal/atrophic genital changes. Status post LAVH BSO in the past for menorrhagia. Had been on estradiol patches but discontinued. Overall is doing well but does note some hot flashes at night. Also notes a worsening in her bladder symptoms as far as some hesitancy and more urinary tract infections. Options for management include OTC soy products such as Estrovin for the hot flashes discussed. Options for her vaginal symptoms include vaginal estrogen. Ring, Vagifem, vaginal estrogen cream discussed. May or may not help with the bladder symptoms. After lengthy discussion the patient wants  to try formulated vaginal estrogen cream twice weekly. Issues of absorption and risks to include thrombosis such as stroke heart attack DVT discussed. Patient will followup with me if she has any issues after initiation. 2. Mild cystocele. Patient without significant symptoms. We'll continue to monitor. Check urinalysis. 3. Mammography due now and I reminded the patient to schedule and she agrees to do so. SBE monthly reviewed. 4. Pap smear 2011 No history of significant abnormal Pap smears. Status post hysterectomy for benign indications. Over the age of 60. We both agreed to stop screening per current screening guidelines and she is comfortable with this. 5. DEXA 2013 normal. Prior DEXA with osteopenia -1.2. Plan repeat at 5 year interval. Increased calcium vitamin D reviewed. 6. Colonoscopy 2011. Repeat at their recommended interval. 7. Health maintenance. No routine blood work done as patient reports this done at her primary physician's office. Followup in one year, sooner as needed or if any issues with the vaginal estrogen.   Note: This document was prepared with digital dictation and possible smart phrase technology. Any transcriptional errors that result from this process are unintentional.   Anastasio Auerbach MD, 9:20 AM 03/31/2014

## 2014-03-31 NOTE — Telephone Encounter (Signed)
RX CALLED IN

## 2014-03-31 NOTE — Telephone Encounter (Signed)
rx called into pharmacy

## 2014-03-31 NOTE — Telephone Encounter (Signed)
Message copied by Thamas Jaegers on Tue Mar 31, 2014 11:23 AM ------      Message from: Anastasio Auerbach      Created: Tue Mar 31, 2014  9:25 AM       Call into the patient's pharmacy formulated vaginal estrogen twice weekly and prefilled syringes as we [facility]. ------

## 2014-03-31 NOTE — Patient Instructions (Signed)
Try over the counter Estrovin to see if that does not help with your night sweats. Try the vaginal estrogen cream twice weekly to see if that does not help with your bladder.  You may obtain a copy of any labs that were done today by logging onto MyChart as outlined in the instructions provided with your AVS (after visit summary). The office will not call with normal lab results but certainly if there are any significant abnormalities then we will contact you.   Health Maintenance, Female A healthy lifestyle and preventative care can promote health and wellness.  Maintain regular health, dental, and eye exams.  Eat a healthy diet. Foods like vegetables, fruits, whole grains, low-fat dairy products, and lean protein foods contain the nutrients you need without too many calories. Decrease your intake of foods high in solid fats, added sugars, and salt. Get information about a proper diet from your caregiver, if necessary.  Regular physical exercise is one of the most important things you can do for your health. Most adults should get at least 150 minutes of moderate-intensity exercise (any activity that increases your heart rate and causes you to sweat) each week. In addition, most adults need muscle-strengthening exercises on 2 or more days a week.   Maintain a healthy weight. The body mass index (BMI) is a screening tool to identify possible weight problems. It provides an estimate of body fat based on height and weight. Your caregiver can help determine your BMI, and can help you achieve or maintain a healthy weight. For adults 20 years and older:  A BMI below 18.5 is considered underweight.  A BMI of 18.5 to 24.9 is normal.  A BMI of 25 to 29.9 is considered overweight.  A BMI of 30 and above is considered obese.  Maintain normal blood lipids and cholesterol by exercising and minimizing your intake of saturated fat. Eat a balanced diet with plenty of fruits and vegetables. Blood tests for  lipids and cholesterol should begin at age 55 and be repeated every 5 years. If your lipid or cholesterol levels are high, you are over 50, or you are a high risk for heart disease, you may need your cholesterol levels checked more frequently.Ongoing high lipid and cholesterol levels should be treated with medicines if diet and exercise are not effective.  If you smoke, find out from your caregiver how to quit. If you do not use tobacco, do not start.  Lung cancer screening is recommended for adults aged 67 80 years who are at high risk for developing lung cancer because of a history of smoking. Yearly low-dose computed tomography (CT) is recommended for people who have at least a 30-pack-year history of smoking and are a current smoker or have quit within the past 15 years. A pack year of smoking is smoking an average of 1 pack of cigarettes a day for 1 year (for example: 1 pack a day for 30 years or 2 packs a day for 15 years). Yearly screening should continue until the smoker has stopped smoking for at least 15 years. Yearly screening should also be stopped for people who develop a health problem that would prevent them from having lung cancer treatment.  If you are pregnant, do not drink alcohol. If you are breastfeeding, be very cautious about drinking alcohol. If you are not pregnant and choose to drink alcohol, do not exceed 1 drink per day. One drink is considered to be 12 ounces (355 mL) of beer, 5 ounces (  148 mL) of wine, or 1.5 ounces (44 mL) of liquor.  Avoid use of street drugs. Do not share needles with anyone. Ask for help if you need support or instructions about stopping the use of drugs.  High blood pressure causes heart disease and increases the risk of stroke. Blood pressure should be checked at least every 1 to 2 years. Ongoing high blood pressure should be treated with medicines, if weight loss and exercise are not effective.  If you are 45 to 73 years old, ask your caregiver if  you should take aspirin to prevent strokes.  Diabetes screening involves taking a blood sample to check your fasting blood sugar level. This should be done once every 3 years, after age 74, if you are within normal weight and without risk factors for diabetes. Testing should be considered at a younger age or be carried out more frequently if you are overweight and have at least 1 risk factor for diabetes.  Breast cancer screening is essential preventative care for women. You should practice "breast self-awareness." This means understanding the normal appearance and feel of your breasts and may include breast self-examination. Any changes detected, no matter how small, should be reported to a caregiver. Women in their 30s and 30s should have a clinical breast exam (CBE) by a caregiver as part of a regular health exam every 1 to 3 years. After age 76, women should have a CBE every year. Starting at age 44, women should consider having a mammogram (breast X-ray) every year. Women who have a family history of breast cancer should talk to their caregiver about genetic screening. Women at a high risk of breast cancer should talk to their caregiver about having an MRI and a mammogram every year.  Breast cancer gene (BRCA)-related cancer risk assessment is recommended for women who have family members with BRCA-related cancers. BRCA-related cancers include breast, ovarian, tubal, and peritoneal cancers. Having family members with these cancers may be associated with an increased risk for harmful changes (mutations) in the breast cancer genes BRCA1 and BRCA2. Results of the assessment will determine the need for genetic counseling and BRCA1 and BRCA2 testing.  The Pap test is a screening test for cervical cancer. Women should have a Pap test starting at age 31. Between ages 24 and 30, Pap tests should be repeated every 2 years. Beginning at age 32, you should have a Pap test every 3 years as long as the past 3 Pap  tests have been normal. If you had a hysterectomy for a problem that was not cancer or a condition that could lead to cancer, then you no longer need Pap tests. If you are between ages 52 and 2, and you have had normal Pap tests going back 10 years, you no longer need Pap tests. If you have had past treatment for cervical cancer or a condition that could lead to cancer, you need Pap tests and screening for cancer for at least 20 years after your treatment. If Pap tests have been discontinued, risk factors (such as a new sexual partner) need to be reassessed to determine if screening should be resumed. Some women have medical problems that increase the chance of getting cervical cancer. In these cases, your caregiver may recommend more frequent screening and Pap tests.  The human papillomavirus (HPV) test is an additional test that may be used for cervical cancer screening. The HPV test looks for the virus that can cause the cell changes on the cervix. The  cells collected during the Pap test can be tested for HPV. The HPV test could be used to screen women aged 75 years and older, and should be used in women of any age who have unclear Pap test results. After the age of 57, women should have HPV testing at the same frequency as a Pap test.  Colorectal cancer can be detected and often prevented. Most routine colorectal cancer screening begins at the age of 55 and continues through age 20. However, your caregiver may recommend screening at an earlier age if you have risk factors for colon cancer. On a yearly basis, your caregiver may provide home test kits to check for hidden blood in the stool. Use of a small camera at the end of a tube, to directly examine the colon (sigmoidoscopy or colonoscopy), can detect the earliest forms of colorectal cancer. Talk to your caregiver about this at age 35, when routine screening begins. Direct examination of the colon should be repeated every 5 to 10 years through age 1,  unless early forms of pre-cancerous polyps or small growths are found.  Hepatitis C blood testing is recommended for all people born from [birth year] through 1965 and any individual with known risks for hepatitis C.  Practice safe sex. Use condoms and avoid high-risk sexual practices to reduce the spread of sexually transmitted infections (STIs). Sexually active women aged 33 and younger should be checked for Chlamydia, which is a common sexually transmitted infection. Older women with new or multiple partners should also be tested for Chlamydia. Testing for other STIs is recommended if you are sexually active and at increased risk.  Osteoporosis is a disease in which the bones lose minerals and strength with aging. This can result in serious bone fractures. The risk of osteoporosis can be identified using a bone density scan. Women ages 59 and over and women at risk for fractures or osteoporosis should discuss screening with their caregivers. Ask your caregiver whether you should be taking a calcium supplement or vitamin D to reduce the rate of osteoporosis.  Menopause can be associated with physical symptoms and risks. Hormone replacement therapy is available to decrease symptoms and risks. You should talk to your caregiver about whether hormone replacement therapy is right for you.  Use sunscreen. Apply sunscreen liberally and repeatedly throughout the day. You should seek shade when your shadow is shorter than you. Protect yourself by wearing long sleeves, pants, a wide-brimmed hat, and sunglasses year round, whenever you are outdoors.  Notify your caregiver of new moles or changes in moles, especially if there is a change in shape or color. Also notify your caregiver if a mole is larger than the size of a pencil eraser.  Stay current with your immunizations. Document Released: 02/13/2011 Document Revised: 11/25/2012 Document Reviewed: 02/13/2011 [facility] Patient Information 2014 [place].

## 2014-04-01 LAB — URINALYSIS W MICROSCOPIC + REFLEX CULTURE
BACTERIA UA: NONE SEEN
Bilirubin Urine: NEGATIVE
CASTS: NONE SEEN
Crystals: NONE SEEN
Glucose, UA: NEGATIVE mg/dL
HGB URINE DIPSTICK: NEGATIVE
KETONES UR: NEGATIVE mg/dL
Nitrite: NEGATIVE
PROTEIN: NEGATIVE mg/dL
Specific Gravity, Urine: 1.012 (ref 1.005–1.030)
UROBILINOGEN UA: 0.2 mg/dL (ref 0.0–1.0)
pH: 7 (ref 5.0–8.0)

## 2014-04-03 ENCOUNTER — Telehealth: Payer: Self-pay | Admitting: *Deleted

## 2014-04-03 LAB — URINE CULTURE: Colony Count: 40000

## 2014-04-03 MED ORDER — NONFORMULARY OR COMPOUNDED ITEM: Status: DC

## 2014-04-03 NOTE — Telephone Encounter (Signed)
Pharmacy informed of 1 gram twice weekly.

## 2014-04-03 NOTE — Telephone Encounter (Signed)
Pt would like to have rx sent to [facility] in [place], this was done

## 2014-04-03 NOTE — Telephone Encounter (Signed)
I called the Rx in for estradiol 0.02% vaginal cream as directed from OV 03/31/14. Pharmacy ( [place]) said they don't have prefilled syringes and will need to know how many grams pt will need to use? Please advise

## 2014-04-03 NOTE — Telephone Encounter (Signed)
Same strength that [facility] prescribes

## 2014-04-03 NOTE — Addendum Note (Signed)
Addended by: Thamas Jaegers on: 04/03/2014 09:34 AM   Modules accepted: Orders

## 2014-04-06 ENCOUNTER — Other Ambulatory Visit: Payer: Self-pay | Admitting: Gynecology

## 2014-04-06 MED ORDER — SULFAMETHOXAZOLE-TMP DS 800-160 MG PO TABS: 1.0000 | ORAL_TABLET | Freq: Two times a day (BID) | ORAL | Status: DC

## 2014-04-28 ENCOUNTER — Ambulatory Visit: Payer: Self-pay | Admitting: Gynecology

## 2014-04-29 ENCOUNTER — Encounter: Payer: Self-pay | Admitting: Gynecology

## 2014-06-01 ENCOUNTER — Other Ambulatory Visit: Payer: Self-pay | Admitting: *Deleted

## 2014-06-01 MED ORDER — TRAMADOL HCL 50 MG PO TABS: 50.0000 mg | ORAL_TABLET | Freq: Three times a day (TID) | ORAL | Status: DC | PRN

## 2014-06-01 NOTE — Telephone Encounter (Signed)
Last office visit 01/22/2014 with Kristin [name].  Last refilled 03/31/2014 for #90 with no refills.  Ok to refill?

## 2014-06-02 NOTE — Telephone Encounter (Signed)
Called to [facility]. 

## 2014-06-15 ENCOUNTER — Encounter: Payer: Self-pay | Admitting: Gynecology

## 2014-07-22 ENCOUNTER — Telehealth: Payer: Self-pay | Admitting: *Deleted

## 2014-07-22 NOTE — Telephone Encounter (Signed)
Cardiac clearance faxed to [facility]

## 2014-08-05 ENCOUNTER — Ambulatory Visit: Payer: Self-pay | Admitting: Otolaryngology

## 2014-08-05 NOTE — H&P (Signed)
  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 Discussed  She had 3 facial skin lesions biopsied and they were reportedly all positive for basal cell carcinoma. She wants to note if she can have them removed by me. She doing well otherwise. Nasal exam looks excellent, no signs of infection or papilloma regrowth. There are multiple facial lesions including left medial eyebrow area, left nasal dorsum, left upper lip adjacent to the ala, left chin area, right cheek area. We will obtain the pathology report and will plan to remove all of these at the outpatient surgical center. The nasal lesion will likely require a small rotation flap for closure. The rest can probably be closed primarily. Reason For Visit  Recheck sinus before she has some facial lesions removed. Allergies  ACE Inhibitors Latex Penicillins. Current Meds  Rec: [id number].  List Reconciled and Reviewed. Diltiazem HCl TABS;; RPT Magnesium CAPS;; RPT Triamcinolone Acetonide CREA;; RPT Vitamin B-12 TABS;; RPT Cozaar TABS (Losartan Potassium);; RPT Fish Oil CAPS;; RPT Co Q 10 CAPS;; RPT TraMADol HCl - 50 MG Oral Tablet;; RPT Tylenol TABS;; RPT Metoprolol Succinate ER 25 MG Oral Tablet Extended Release 24 Hour;; RPT Caltrate 600+D TABS;; RPT Estroven TABS;; RPT Phillips Colon Health CAPS;; RPT Neomycin-Polymyxin-Dexameth 3.5-10000-0.1 Ophthalmic Ointment;; RPT; Status: TEMPORARY DEFERRAL  Multi-Vitamins Oral Tablet;; RPT Amended [place] Sizer  LPN; 24/82/5003 70:48 AM EST. Active Problems  Arthritis   (716.90) (M19.90) Facial basal cell cancer   (173.31) (C44.310) Hearing loss   (389.9) (H91.90) Hypertension   (401.9) (I10) Obstructive sleep apnea   (327.23) (G47.33) Rheumatoid arthritis   (714.0) (M06.9) Amended : [place] Sizer  LPN; 88/91/6945 03:88 AM EST. PMH  History of myocardial infarction (412) (I25.2) History of stroke (V12.54) (Z86.73). History of EPISTAXIS_ (784.7); Resolved:  [id number]. History of dizziness (V13.89) (Z87.898); Resolved: [id number]. History of allergic rhinitis (V12.69) (Z87.09); Resolved: 94IAX6553. History of Lightheadedness (780.4) (R42); Resolved: 74MOL0786. PSH  Cataract Surgery Hysterectomy Nose Surgery Oral Surgery Tooth Extraction Sinus Surgery Tubal Ligation. Family Hx  Family history of allergies (V19.6) (Z84.89) Family history of cardiac disorder: Mother (V17.49) (Z82.49) Family history of hypertension: Mother (V68.49) (Z81.49) Family history of malignant neoplasm: Father (V16.9) (Z80.9) Family history of osteoporosis (V17.81) (Z82.62) Family history of rheumatoid arthritis (V17.7) (Z82.61) Amended [place] Sizer  LPN; 75/44/9201 00:71 AM EST. Personal Hx  Caffeine use (F15.90); 2 cups daily Hearing loss (389.9) (H91.90) Never smoker No alcohol use Non-smoker (V49.89) (Z78.9). Vital Signs   Recorded by [facility] on 25 Jun 2014 09:36 AM BP:122/70,  Height: 5 ft 5 in, Weight: 142 lb , BMI: 23.6 kg/m2,  BMI Calculated: 23.63 ,  BSA Calculated: 1.71  Amended : Iran Sizer  LPN; 21/97/5883 25:49 AM EST. Signature  Electronically signed by : Izora Gala  M.D.; 06/25/2014 9:38 AM EST. Electronically signed by : Izora Gala  M.D.; 06/25/2014 10:12 AM EST.

## 2014-08-11 ENCOUNTER — Encounter (HOSPITAL_COMMUNITY): Payer: Self-pay

## 2014-08-11 ENCOUNTER — Encounter (HOSPITAL_COMMUNITY)
Admission: RE | Admit: 2014-08-11 | Discharge: 2014-08-11 | Disposition: A | Discharge status: Home / Self Care | Payer: Medicare Other | Source: Ambulatory Visit | Attending: Otolaryngology | Admitting: Otolaryngology

## 2014-08-11 DIAGNOSIS — Z01812 Encounter for preprocedural laboratory examination: Secondary | ICD-10-CM | POA: Diagnosis present

## 2014-08-11 HISTORY — DX: Adverse effect of unspecified anesthetic, initial encounter: T41.45XA

## 2014-08-11 HISTORY — DX: Urinary tract infection, site not specified: N39.0

## 2014-08-11 HISTORY — DX: Other complications of anesthesia, initial encounter: T88.59XA

## 2014-08-11 HISTORY — DX: Pain in unspecified joint: M25.50

## 2014-08-11 HISTORY — DX: Cough, unspecified: R05.9

## 2014-08-11 HISTORY — DX: Personal history of other infectious and parasitic diseases: Z86.19

## 2014-08-11 HISTORY — DX: Rheumatoid arthritis, unspecified: M06.9

## 2014-08-11 HISTORY — DX: Cough: R05

## 2014-08-11 HISTORY — DX: Other seasonal allergic rhinitis: J30.2

## 2014-08-11 HISTORY — DX: Fibromyalgia: M79.7

## 2014-08-11 HISTORY — DX: Dizziness and giddiness: R42

## 2014-08-11 HISTORY — DX: Unspecified osteoarthritis, unspecified site: M19.90

## 2014-08-11 HISTORY — DX: Irritable bowel syndrome, unspecified: K58.9

## 2014-08-11 HISTORY — DX: Effusion, unspecified joint: M25.40

## 2014-08-11 LAB — CBC
HEMATOCRIT: 41.7 % (ref 36.0–46.0)
Hemoglobin: 13.6 g/dL (ref 12.0–15.0)
MCH: 31.1 pg (ref 26.0–34.0)
MCHC: 32.6 g/dL (ref 30.0–36.0)
MCV: 95.4 fL (ref 78.0–100.0)
Platelets: 238 10*3/uL (ref 150–400)
RBC: 4.37 MIL/uL (ref 3.87–5.11)
RDW: 14 % (ref 11.5–15.5)
WBC: 9.5 10*3/uL (ref 4.0–10.5)

## 2014-08-11 LAB — BASIC METABOLIC PANEL
ANION GAP: 9 (ref 5–15)
BUN: 18 mg/dL (ref 6–23)
CHLORIDE: 103 meq/L (ref 96–112)
CO2: 28 mmol/L (ref 19–32)
CREATININE: 0.92 mg/dL (ref 0.50–1.10)
Calcium: 9.6 mg/dL (ref 8.4–10.5)
GFR calc Af Amer: 70 mL/min — ABNORMAL LOW (ref >90)
GFR calc non Af Amer: 60 mL/min — ABNORMAL LOW (ref >90)
GLUCOSE: 98 mg/dL (ref 70–99)
Potassium: 3.8 mmol/L (ref 3.5–5.1)
Sodium: 140 mmol/L (ref 135–145)

## 2014-08-11 NOTE — Pre-Procedure Instructions (Signed)
Kristin [name]  08/11/2014   Your procedure is scheduled on:  Wed, Jan 6 @ 11:45 AM  Report to [facility]  at 9:45 AM.  Call this number if you have problems the morning of surgery: [phone]   Remember:   Do not eat food or drink liquids after midnight.   Take these medicines the morning of surgery with A SIP OF WATER: Diltiazem(Cardizem),Metoprolol(Toprol),Bactim(Sulfamethoxazole),and Tramadol(Ultram-if needed)               Stop taking your Fish Oil and Aspirin. No Goody's,BC's,Aleve,Ibuprofen,or any Herbal Medications   Do not wear jewelry, make-up or nail polish.  Do not wear lotions, powders, or perfumes. You may wear deodorant.  Do not shave 48 hours prior to surgery.   Do not bring valuables to the hospital.  [facility] is not responsible                  for any belongings or valuables.               Contacts, dentures or bridgework may not be worn into surgery.  Leave suitcase in the car. After surgery it may be brought to your room.  For patients admitted to the hospital, discharge time is determined by your                treatment team.               Patients discharged the day of surgery will not be allowed to drive  home.    Special Instructions:  [place] - Preparing for Surgery  Before surgery, you can play an important role.  Because skin is not sterile, your skin needs to be as free of germs as possible.  You can reduce the number of germs on you skin by washing with CHG (chlorahexidine gluconate) soap before surgery.  CHG is an antiseptic cleaner which kills germs and bonds with the skin to continue killing germs even after washing.  Please DO NOT use if you have an allergy to CHG or antibacterial soaps.  If your skin becomes reddened/irritated stop using the CHG and inform your nurse when you arrive at Short Stay.  Do not shave (including legs and underarms) for at least 48 hours prior to the first CHG shower.  You may shave your  face.  Please follow these instructions carefully:   1.  Shower with CHG Soap the night before surgery and the                                morning of Surgery.  2.  If you choose to wash your hair, wash your hair first as usual with your       normal shampoo.  3.  After you shampoo, rinse your hair and body thoroughly to remove the                      Shampoo.  4.  Use CHG as you would any other liquid soap.  You can apply chg directly       to the skin and wash gently with scrungie or a clean washcloth.  5.  Apply the CHG Soap to your body ONLY FROM THE NECK DOWN.        Do not use on open wounds or open sores.  Avoid contact with your eyes,  ears, mouth and genitals (private parts).  Wash genitals (private parts)       with your normal soap.  6.  Wash thoroughly, paying special attention to the area where your surgery        will be performed.  7.  Thoroughly rinse your body with warm water from the neck down.  8.  DO NOT shower/wash with your normal soap after using and rinsing off       the CHG Soap.  9.  Pat yourself dry with a clean towel.            10.  Wear clean pajamas.            11.  Place clean sheets on your bed the night of your first shower and do not        sleep with pets.  Day of Surgery  Do not apply any lotions/deoderants the morning of surgery.  Please wear clean clothes to the hospital/surgery center.     Please read over the following fact sheets that you were given: Pain Booklet, Coughing and Deep Breathing and Surgical Site Infection Prevention

## 2014-08-11 NOTE — Progress Notes (Addendum)
Echo report in epic from 2013  Stress test reports in epic from 2007/2013  EKG in epic from 01-30-14  Medical Md is Dr.Amy [facility]  Cardiologist is Dr.Arida with last visit in epic from 01/2014  Heart cath to be requested from [facility]

## 2014-08-18 MED ORDER — CLINDAMYCIN PHOSPHATE 600 MG/50ML IV SOLN
600.0000 mg | Freq: Once | INTRAVENOUS | Status: AC
  Administered 2014-08-19: 600 mg via INTRAVENOUS
  Filled 2014-08-18: qty 50

## 2014-08-18 NOTE — Anesthesia Preprocedure Evaluation (Addendum)
Anesthesia Evaluation  Patient identified by MRN, date of birth, ID band Patient awake    Reviewed: Allergy & Precautions, NPO status , Patient's Chart, lab work & pertinent test results, reviewed documented beta blocker date and time   Airway Mallampati: II   Neck ROM: Full    Dental  (+) Teeth Intact   Pulmonary sleep apnea ,  breath sounds clear to auscultation        Cardiovascular hypertension, Pt. on medications Past MI: 1996. 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    Peds  Hematology   Anesthesia Other Findings   Reproductive/Obstetrics                           Anesthesia Physical Anesthesia Plan  ASA: III  Anesthesia Plan: General   Post-op Pain Management:    Induction: Intravenous  Airway Management Planned: Oral ETT  Additional Equipment:   Intra-op Plan:   Post-operative Plan: Extubation in OR  Informed Consent: I have reviewed the patients History and Physical, chart, labs and discussed the procedure including the risks, benefits and alternatives for the proposed anesthesia with the patient or authorized representative who has indicated his/her understanding and acceptance.     Plan Discussed with:   Anesthesia Plan Comments: (Surgeon would like to proced with MAC, discussed with patient and will do MAC and convert yo GA only if required)       Anesthesia Quick Evaluation

## 2014-08-19 ENCOUNTER — Encounter (HOSPITAL_COMMUNITY)
Admission: RE | Disposition: A | Discharge status: Home / Self Care | Payer: Self-pay | Source: Ambulatory Visit | Attending: Otolaryngology

## 2014-08-19 ENCOUNTER — Ambulatory Visit (HOSPITAL_COMMUNITY)
Admission: RE | Admit: 2014-08-19 | Discharge: 2014-08-19 | Disposition: A | Discharge status: Home / Self Care | Payer: Medicare Other | Source: Ambulatory Visit | Attending: Otolaryngology | Admitting: Otolaryngology

## 2014-08-19 ENCOUNTER — Ambulatory Visit (HOSPITAL_COMMUNITY): Payer: Medicare Other | Admitting: Certified Registered Nurse Anesthetist

## 2014-08-19 ENCOUNTER — Encounter (HOSPITAL_COMMUNITY): Payer: Self-pay | Admitting: *Deleted

## 2014-08-19 DIAGNOSIS — G4730 Sleep apnea, unspecified: Secondary | ICD-10-CM | POA: Insufficient documentation

## 2014-08-19 DIAGNOSIS — Z880 Allergy status to penicillin: Secondary | ICD-10-CM | POA: Insufficient documentation

## 2014-08-19 DIAGNOSIS — Z90710 Acquired absence of both cervix and uterus: Secondary | ICD-10-CM | POA: Diagnosis not present

## 2014-08-19 DIAGNOSIS — Z91040 Latex allergy status: Secondary | ICD-10-CM | POA: Insufficient documentation

## 2014-08-19 DIAGNOSIS — Z8673 Personal history of transient ischemic attack (TIA), and cerebral infarction without residual deficits: Secondary | ICD-10-CM | POA: Insufficient documentation

## 2014-08-19 DIAGNOSIS — C760 Malignant neoplasm of head, face and neck: Secondary | ICD-10-CM | POA: Diagnosis present

## 2014-08-19 DIAGNOSIS — M069 Rheumatoid arthritis, unspecified: Secondary | ICD-10-CM | POA: Insufficient documentation

## 2014-08-19 DIAGNOSIS — I10 Essential (primary) hypertension: Secondary | ICD-10-CM | POA: Insufficient documentation

## 2014-08-19 HISTORY — PX: LIPOMA EXCISION: SHX5283

## 2014-08-19 SURGERY — EXCISION LIPOMA
Anesthesia: Monitor Anesthesia Care | Site: Face

## 2014-08-19 MED ORDER — LACTATED RINGERS IV SOLN
INTRAVENOUS | Status: DC
  Administered 2014-08-19: 10:00:00 via INTRAVENOUS

## 2014-08-19 MED ORDER — LIDOCAINE-EPINEPHRINE 1 %-1:100000 IJ SOLN
INTRAMUSCULAR | Status: AC
  Filled 2014-08-19: qty 1

## 2014-08-19 MED ORDER — LACTATED RINGERS IV SOLN
INTRAVENOUS | Status: DC | PRN
Start: 2014-08-19 — End: 2014-08-19
  Administered 2014-08-19: 12:00:00 via INTRAVENOUS

## 2014-08-19 MED ORDER — MIDAZOLAM HCL 2 MG/2ML IJ SOLN
INTRAMUSCULAR | Status: AC
  Filled 2014-08-19: qty 2

## 2014-08-19 MED ORDER — CLINDAMYCIN HCL 300 MG PO CAPS: 300.0000 mg | ORAL_CAPSULE | Freq: Three times a day (TID) | ORAL | Status: DC

## 2014-08-19 MED ORDER — 0.9 % SODIUM CHLORIDE (POUR BTL) OPTIME
TOPICAL | Status: DC | PRN
  Administered 2014-08-19: 1000 mL

## 2014-08-19 MED ORDER — HYDROCODONE-ACETAMINOPHEN 7.5-325 MG PO TABS: 1.0000 | ORAL_TABLET | Freq: Four times a day (QID) | ORAL | Status: DC | PRN

## 2014-08-19 MED ORDER — MIDAZOLAM HCL 5 MG/5ML IJ SOLN
INTRAMUSCULAR | Status: DC | PRN
  Administered 2014-08-19 (×2): .5 mg via INTRAVENOUS

## 2014-08-19 MED ORDER — PROPOFOL INFUSION 10 MG/ML OPTIME
INTRAVENOUS | Status: DC | PRN
  Administered 2014-08-19: 25 ug/kg/min via INTRAVENOUS

## 2014-08-19 MED ORDER — LIDOCAINE-EPINEPHRINE 1 %-1:100000 IJ SOLN
INTRAMUSCULAR | Status: DC | PRN
  Administered 2014-08-19: 30 mL

## 2014-08-19 MED ORDER — ONDANSETRON HCL 8 MG PO TABS: 8.0000 mg | ORAL_TABLET | Freq: Three times a day (TID) | ORAL | Status: DC | PRN

## 2014-08-19 MED ORDER — PROPOFOL 10 MG/ML IV BOLUS
INTRAVENOUS | Status: AC
  Filled 2014-08-19: qty 20

## 2014-08-19 MED ORDER — FENTANYL CITRATE 0.05 MG/ML IJ SOLN
INTRAMUSCULAR | Status: AC
  Filled 2014-08-19: qty 5

## 2014-08-19 SURGICAL SUPPLY — 25 items
CANISTER SUCTION 2500CC (MISCELLANEOUS) ×3 IMPLANT
CLEANER TIP ELECTROSURG 2X2 (MISCELLANEOUS) ×3 IMPLANT
CONT SPEC 4OZ CLIKSEAL STRL BL (MISCELLANEOUS) ×3 IMPLANT
COVER SURGICAL LIGHT HANDLE (MISCELLANEOUS) ×3 IMPLANT
ELECT COATED BLADE 2.86 ST (ELECTRODE) ×3 IMPLANT
ELECT NEEDLE TIP 2.8 STRL (NEEDLE) ×3 IMPLANT
ELECT REM PT RETURN 9FT ADLT (ELECTROSURGICAL) ×3
ELECTRODE REM PT RTRN 9FT ADLT (ELECTROSURGICAL) ×1 IMPLANT
GAUZE SPONGE 4X4 12PLY STRL (GAUZE/BANDAGES/DRESSINGS) ×3 IMPLANT
GLOVE BIOGEL PI IND STRL 7.0 (GLOVE) ×1 IMPLANT
GLOVE BIOGEL PI INDICATOR 7.0 (GLOVE) ×2
GLOVE ECLIPSE 7.5 STRL STRAW (GLOVE) ×3 IMPLANT
GLOVE SURG SS PI 7.0 STRL IVOR (GLOVE) ×3 IMPLANT
GOWN STRL REUS W/ TWL LRG LVL3 (GOWN DISPOSABLE) ×2 IMPLANT
GOWN STRL REUS W/TWL LRG LVL3 (GOWN DISPOSABLE) ×4
KIT BASIN OR (CUSTOM PROCEDURE TRAY) ×3 IMPLANT
KIT ROOM TURNOVER OR (KITS) ×3 IMPLANT
LIQUID BAND (GAUZE/BANDAGES/DRESSINGS) ×3 IMPLANT
NS IRRIG 1000ML POUR BTL (IV SOLUTION) ×3 IMPLANT
PAD ARMBOARD 7.5X6 YLW CONV (MISCELLANEOUS) ×6 IMPLANT
PENCIL FOOT CONTROL (ELECTRODE) ×3 IMPLANT
SUT CHROMIC 4 0 P 3 18 (SUTURE) ×6 IMPLANT
TOWEL OR 17X24 6PK STRL BLUE (TOWEL DISPOSABLE) ×3 IMPLANT
TOWEL OR 17X26 10 PK STRL BLUE (TOWEL DISPOSABLE) ×3 IMPLANT
TRAY ENT MC OR (CUSTOM PROCEDURE TRAY) ×3 IMPLANT

## 2014-08-19 NOTE — Interval H&P Note (Signed)
History and Physical Interval Note:  08/19/2014 11:44 AM  Kristin [name]  has presented today for surgery, with the diagnosis of FACIAL SKIN CANCERS   The various methods of treatment have been discussed with the patient and family. After consideration of risks, benefits and other options for treatment, the patient has consented to  Procedure(s): EXCISION OF MULTIPLE FACIAL CANCER AND SOME WITH FLAP CLOSURE  (N/A) as a surgical intervention .  The patient's history has been reviewed, patient examined, no change in status, stable for surgery.  I have reviewed the patient's chart and labs.  Questions were answered to the patient's satisfaction.     Kristin [name]

## 2014-08-19 NOTE — H&P (View-Only) (Signed)
  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 Discussed  She had 3 facial skin lesions biopsied and they were reportedly all positive for basal cell carcinoma. She wants to note if she can have them removed by me. She doing well otherwise. Nasal exam looks excellent, no signs of infection or papilloma regrowth. There are multiple facial lesions including left medial eyebrow area, left nasal dorsum, left upper lip adjacent to the ala, left chin area, right cheek area. We will obtain the pathology report and will plan to remove all of these at the outpatient surgical center. The nasal lesion will likely require a small rotation flap for closure. The rest can probably be closed primarily. Reason For Visit  Recheck sinus before she has some facial lesions removed. Allergies  ACE Inhibitors Latex Penicillins. Current Meds  Rec: [id number].  List Reconciled and Reviewed. Diltiazem HCl TABS;; RPT Magnesium CAPS;; RPT Triamcinolone Acetonide CREA;; RPT Vitamin B-12 TABS;; RPT Cozaar TABS (Losartan Potassium);; RPT Fish Oil CAPS;; RPT Co Q 10 CAPS;; RPT TraMADol HCl - 50 MG Oral Tablet;; RPT Tylenol TABS;; RPT Metoprolol Succinate ER 25 MG Oral Tablet Extended Release 24 Hour;; RPT Caltrate 600+D TABS;; RPT Estroven TABS;; RPT Phillips Colon Health CAPS;; RPT Neomycin-Polymyxin-Dexameth 3.5-10000-0.1 Ophthalmic Ointment;; RPT; Status: TEMPORARY DEFERRAL  Multi-Vitamins Oral Tablet;; RPT Amended [place] Sizer  LPN; 58/52/7782 42:35 AM EST. Active Problems  Arthritis   (716.90) (M19.90) Facial basal cell cancer   (173.31) (C44.310) Hearing loss   (389.9) (H91.90) Hypertension   (401.9) (I10) Obstructive sleep apnea   (327.23) (G47.33) Rheumatoid arthritis   (714.0) (M06.9) Amended : [place] Sizer  LPN; 36/14/4315 40:08 AM EST. PMH  History of myocardial infarction (412) (I25.2) History of stroke (V12.54) (Z86.73). History of EPISTAXIS_ (784.7); Resolved:  [id number]. History of dizziness (V13.89) (Z87.898); Resolved: 26ZTI4580. History of allergic rhinitis (V12.69) (Z87.09); Resolved: 99IPJ8250. History of Lightheadedness (780.4) (R42); Resolved: 53ZJQ7341. PSH  Cataract Surgery Hysterectomy Nose Surgery Oral Surgery Tooth Extraction Sinus Surgery Tubal Ligation. Family Hx  Family history of allergies (V19.6) (Z84.89) Family history of cardiac disorder: Mother (V17.49) (Z82.49) Family history of hypertension: Mother (V57.49) (Z15.49) Family history of malignant neoplasm: Father (V16.9) (Z80.9) Family history of osteoporosis (V17.81) (Z82.62) Family history of rheumatoid arthritis (V17.7) (Z82.61) Amended [place] Sizer  LPN; 93/79/0240 97:35 AM EST. Personal Hx  Caffeine use (F15.90); 2 cups daily Hearing loss (389.9) (H91.90) Never smoker No alcohol use Non-smoker (V49.89) (Z78.9). Vital Signs   Recorded by [facility] on 25 Jun 2014 09:36 AM BP:122/70,  Height: 5 ft 5 in, Weight: 142 lb , BMI: 23.6 kg/m2,  BMI Calculated: 23.63 ,  BSA Calculated: 1.71  Amended : Iran Sizer  LPN; 32/99/2426 83:41 AM EST. Signature  Electronically signed by : Izora Gala  M.D.; 06/25/2014 9:38 AM EST. Electronically signed by : Izora Gala  M.D.; 06/25/2014 10:12 AM EST.

## 2014-08-19 NOTE — Op Note (Signed)
OPERATIVE REPORT  DATE OF SURGERY: 08/19/2014  PATIENT:  [name],  74 y.o. 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 Right forehead skin lesion. 2. Left forehead skin lesion. 3. Left medial brow lesion. 4. Left nasal labial fold lesion. 5. left lower lip lesion. 6. left cheek skin lesion.  COUNTS:  Correct  PROCEDURE DETAILS: The patient was taken to the operating room and placed on the operating table in the supine position. Following induction of intravenous sedation, the face was prepped and draped in standard fashion. All of the excision sites were outlined with a marking pen prior to the procedure. 1% Xylocaine with epinephrine was infiltrated into all of the lesions. All of the lesions were excised with healthy margins using an elliptical incision except for the left nasal labial fold lesion which was removed using a circular incision. All the lesions were closed primarily using subcuticular interrupted 4-0 chromic sutures except for the nasal labial fold lesion which was closed by elevating a flap laterally. Cautery was used for completion of hemostasis and all lesions. Dermabond was used on the skin in all. Patient tolerated the procedures well and was transferred to recovery in stable condition.    PATIENT DISPOSITION:  To PACU, stable

## 2014-08-19 NOTE — Transfer of Care (Signed)
Immediate Anesthesia Transfer of Care Note  Patient: Kristin [name]  Procedure(s) Performed: Procedure(s): EXCISION OF MULTIPLE FACIAL CANCER LESIONS AND SOME WITH FLAP CLOSURE  (N/A)  Patient Location: PACU  Anesthesia Type:MAC  Level of Consciousness: awake, alert , oriented and patient cooperative  Airway & Oxygen Therapy: Patient Spontanous Breathing  Post-op Assessment: Report given to PACU RN, Post -op Vital signs reviewed and stable, Patient moving all extremities, Patient moving all extremities X 4 and Patient able to stick tongue midline  Post vital signs: Reviewed and stable  Complications: No apparent anesthesia complications

## 2014-08-19 NOTE — Discharge Instructions (Signed)
It is okay to shower and use soap and water but do not use any creams, oils or ointment on the surgical sites.

## 2014-08-19 NOTE — Anesthesia Postprocedure Evaluation (Signed)
  Anesthesia Post-op Note  Patient: Kristin [name]  Procedure(s) Performed: Procedure(s): EXCISION OF MULTIPLE FACIAL CANCER LESIONS AND SOME WITH FLAP CLOSURE  (N/A)  Patient Location: PACU  Anesthesia Type:MAC  Level of Consciousness: awake, alert  and oriented  Airway and Oxygen Therapy: Patient Spontanous Breathing and Patient connected to nasal cannula oxygen  Post-op Pain: none  Post-op Assessment: Post-op Vital signs reviewed, Patient's Cardiovascular Status Stable, Respiratory Function Stable, Patent Airway and No signs of Nausea or vomiting  Post-op Vital Signs: Reviewed and stable  Last Vitals:  Filed Vitals:   08/19/14 1315  BP: 155/48  Pulse: 55  Temp: 36.4 C  Resp:     Complications: No apparent anesthesia complications

## 2014-08-20 ENCOUNTER — Encounter (HOSPITAL_COMMUNITY): Payer: Self-pay | Admitting: Otolaryngology

## 2014-09-10 ENCOUNTER — Other Ambulatory Visit: Payer: Self-pay | Admitting: *Deleted

## 2014-09-10 MED ORDER — TRAMADOL HCL 50 MG PO TABS: 50.0000 mg | ORAL_TABLET | Freq: Three times a day (TID) | ORAL | Status: DC | PRN

## 2014-09-10 MED ORDER — METOPROLOL SUCCINATE ER 25 MG PO TB24: 25.0000 mg | ORAL_TABLET | Freq: Every day | ORAL | Status: DC

## 2014-09-10 NOTE — Telephone Encounter (Signed)
Called to [employer].

## 2014-09-10 NOTE — Telephone Encounter (Signed)
Last office visit 01/22/2014 with Webb Silversmith for UTI.  Last refilled 06/01/2014 for #90 with no refills.  Ok to refill?

## 2014-09-28 ENCOUNTER — Other Ambulatory Visit: Payer: Self-pay

## 2014-09-28 MED ORDER — METOPROLOL SUCCINATE ER 25 MG PO TB24: 25.0000 mg | ORAL_TABLET | Freq: Every day | ORAL | Status: DC

## 2014-09-28 NOTE — Telephone Encounter (Signed)
Refill sent for metoprolol succ 

## 2014-10-19 ENCOUNTER — Ambulatory Visit (INDEPENDENT_AMBULATORY_CARE_PROVIDER_SITE_OTHER): Payer: Medicare Other | Admitting: Family Medicine

## 2014-10-19 ENCOUNTER — Encounter: Payer: Self-pay | Admitting: Family Medicine

## 2014-10-19 VITALS — BP 146/84 | HR 62 | Temp 97.8°F | Ht 66.0 in | Wt 149.0 lb

## 2014-10-19 DIAGNOSIS — J208 Acute bronchitis due to other specified organisms: Secondary | ICD-10-CM

## 2014-10-19 MED ORDER — GUAIFENESIN-CODEINE 100-10 MG/5ML PO SYRP: 5.0000 mL | ORAL_SOLUTION | Freq: Every evening | ORAL | Status: DC | PRN

## 2014-10-19 NOTE — Progress Notes (Signed)
Pre visit review using our clinic review tool, if applicable. No additional management support is needed unless otherwise documented below in the visit note. 

## 2014-10-19 NOTE — Assessment & Plan Note (Signed)
Anticipate viral given short duration. Supportive care as per instructions. cheratussin for cough at night time. Push fluids and rest.  Update if not improving as expected in next 3-4 days or if worsening for consideration of abx course (zpack)

## 2014-10-19 NOTE — Progress Notes (Signed)
BP 146/84 mmHg  Pulse 62  Temp(Src) 97.8 F (36.6 C) (Oral)  Ht 5\' 6"  (1.676 m)  Wt 149 lb (67.586 kg)  BMI 24.06 kg/m2  SpO2 98%  LMP 08/14/1989   CC: cough Subjective:    Patient ID: Kristin [name], female    DOB: 1941/07/28, 74 y.o.   MRN: [id number]  HPI: Kristin [name] is a 74 y.o. female presenting on 10/19/2014 for Cough; Nasal Congestion; Sore Throat; and Wheezing   Babysat grandson last weekend with cough, then 5d ago started with moist hacking cough productive of thick yellow sputum. Coughing keeping her up at night. Slight headache. + PNdrainage. Started as ST. + sinus congestion > chest congestion. Progressively worsening.   Denies fevers/chills, ear or tooth pain, abd pain, nausea. No new myalgias/arthralgias/body aches.  Taking delsym, tylenol/tramadol for pain.  Drinking plenty of water.   No smokers at home. No h/o asthma.   Relevant past medical, surgical, family and social history reviewed and updated as indicated. Interim medical history since our last visit reviewed. Allergies and medications reviewed and updated. Current Outpatient Prescriptions on File Prior to Visit  Medication Sig  . acetaminophen (TYLENOL) 325 MG tablet Take 325 mg by mouth every 8 (eight) hours as needed (pain).   [name] aspirin EC 81 MG tablet Take 81 mg by mouth daily.  . Calcium Carbonate-Vitamin D (CALTRATE 600+D PO) Take 1 tablet by mouth 2 (two) times daily.   . clobetasol cream (TEMOVATE) 4.58 % Apply 1 application topically 2 (two) times daily as needed (inflammation).  . Coenzyme Q10 200 MG capsule Take 200 mg by mouth daily.    [name] diltiazem (CARDIZEM SR) 120 MG 12 hr capsule Take 1 capsule (120 mg total) by mouth daily.  . Doxylamine Succinate, Sleep, (UNISOM) 25 MG tablet Take 12.5 mg by mouth at bedtime as needed for sleep.   [name] losartan (COZAAR) 25 MG tablet Take 1 tablet (25 mg total) by mouth daily.  . Magnesium 250 MG TABS Take 250 mg by mouth daily.   . metoprolol  succinate (TOPROL XL) 25 MG 24 hr tablet Take 1 tablet (25 mg total) by mouth daily.  . metroNIDAZOLE (METROGEL) 0.75 % gel Apply 1 application topically 2 (two) times daily as needed (acne rosacea).  . Multiple Vitamin (MULTIVITAMIN) tablet Take 1 tablet by mouth daily.    . Omega-3 Fatty Acids (FISH OIL) 1200 MG CAPS Take 1 capsule by mouth 2 (two) times daily.  [name] OVER THE COUNTER MEDICATION Take 1 capsule by mouth daily. Estrogen herbal supplement  . Polyethyl Glycol-Propyl Glycol (SYSTANE) 0.4-0.3 % SOLN Apply 1 drop to eye daily as needed (dry eyes).   . Probiotic Product (Lexa) CAPS Take 1 capsule by mouth daily.  . traMADol (ULTRAM) 50 MG tablet Take 1 tablet (50 mg total) by mouth every 8 (eight) hours as needed.  . vitamin B-12 (CYANOCOBALAMIN) 1000 MCG tablet Take 1,000 mcg by mouth daily.   No current facility-administered medications on file prior to visit.    Review of Systems Per HPI unless specifically indicated above     Objective:    BP 146/84 mmHg  Pulse 62  Temp(Src) 97.8 F (36.6 C) (Oral)  Ht 5\' 6"  (1.676 m)  Wt 149 lb (67.586 kg)  BMI 24.06 kg/m2  SpO2 98%  LMP 08/14/1989  Wt Readings from Last 3 Encounters:  10/19/14 149 lb (67.586 kg)  08/19/14 150 lb (68.04 kg)  03/31/14 150 lb (68.04 kg)  Physical Exam  Constitutional: She appears well-developed and well-nourished. No distress.  Tired appearing but nontoxic  HENT:  Head: Normocephalic and atraumatic.  Right Ear: Hearing, tympanic membrane, external ear and ear canal normal.  Left Ear: Hearing, tympanic membrane, external ear and ear canal normal.  Nose: No mucosal edema or rhinorrhea. Right sinus exhibits no maxillary sinus tenderness and no frontal sinus tenderness. Left sinus exhibits no maxillary sinus tenderness and no frontal sinus tenderness.  Mouth/Throat: Uvula is midline, oropharynx is clear and moist and mucous membranes are normal. No oropharyngeal exudate, posterior  oropharyngeal edema, posterior oropharyngeal erythema or tonsillar abscesses.  Eyes: Conjunctivae and EOM are normal. Pupils are equal, round, and reactive to light. No scleral icterus.  Neck: Normal range of motion. Neck supple.  Cardiovascular: Normal rate, regular rhythm, normal heart sounds and intact distal pulses.   No murmur heard. Pulmonary/Chest: Effort normal and breath sounds normal. No respiratory distress. She has no wheezes. She has no rales.  Harsh cough present  Lymphadenopathy:    She has no cervical adenopathy.  Skin: Skin is warm and dry. No rash noted.  Nursing note and vitals reviewed.     Assessment & Plan:   Problem List Items Addressed This Visit    Acute viral bronchitis - Primary    Anticipate viral given short duration. Supportive care as per instructions. cheratussin for cough at night time. Push fluids and rest.  Update if not improving as expected in next 3-4 days or if worsening for consideration of abx course (zpack)          Follow up plan: Return if symptoms worsen or fail to improve.

## 2014-10-19 NOTE — Patient Instructions (Signed)
I do think you have viral upper respiratory infection that may be developing into viral bronchitis. This should get better with time and rest. Push lots of fluids. cheratussin for cough at night time. If fever >101, worsening cough instead of improving or persistent symptoms past 10 days call us with an update and likely for antibiotic.

## 2014-10-20 ENCOUNTER — Telehealth: Payer: Self-pay

## 2014-10-20 MED ORDER — AZITHROMYCIN 250 MG PO TABS: ORAL_TABLET | ORAL | Status: DC

## 2014-10-20 NOTE — Telephone Encounter (Signed)
Pt left v/m; pt was seen 10/19/14, pt's husband had severe lung disease and pt said abx was discussed on 10/19/14 but not ordered. Pt request abx sent to [facility] and not wait a few days due to pts husbands condition.Please advise.

## 2014-10-20 NOTE — Telephone Encounter (Signed)
Called and notified patient of Dr Gutierrez's comments. Patient verbalized understanding.

## 2014-10-20 NOTE — Telephone Encounter (Signed)
plz noify antibiotic sent in. But would have her monitor for worsening cough or sxs prior to starting abx as discussed.  Exam and story was more consistent with viral bronchitis and abx will not help this. Ensure universal precautions and using mask while around husband for next few days regardless whether she starts abx.

## 2014-10-22 ENCOUNTER — Other Ambulatory Visit: Payer: Self-pay | Admitting: *Deleted

## 2014-10-22 MED ORDER — TRAMADOL HCL 50 MG PO TABS: 50.0000 mg | ORAL_TABLET | Freq: Three times a day (TID) | ORAL | Status: DC | PRN

## 2014-10-22 NOTE — Telephone Encounter (Signed)
Last office visit 10/19/2014 with Dr. Darnell Level.  CPE scheduled for 01/14/2015.  Last refilled 09/10/2014 for #90 with no refills.  Ok to refill?

## 2014-10-22 NOTE — Telephone Encounter (Signed)
Called to [employer].

## 2014-10-27 ENCOUNTER — Ambulatory Visit (INDEPENDENT_AMBULATORY_CARE_PROVIDER_SITE_OTHER): Payer: Medicare Other | Admitting: Family Medicine

## 2014-10-27 ENCOUNTER — Encounter: Payer: Self-pay | Admitting: Family Medicine

## 2014-10-27 VITALS — BP 138/76 | HR 64 | Temp 97.7°F | Ht 66.0 in | Wt 148.8 lb

## 2014-10-27 DIAGNOSIS — B9689 Other specified bacterial agents as the cause of diseases classified elsewhere: Secondary | ICD-10-CM

## 2014-10-27 DIAGNOSIS — B370 Candidal stomatitis: Secondary | ICD-10-CM | POA: Insufficient documentation

## 2014-10-27 DIAGNOSIS — J208 Acute bronchitis due to other specified organisms: Principal | ICD-10-CM

## 2014-10-27 DIAGNOSIS — J00 Acute nasopharyngitis [common cold]: Secondary | ICD-10-CM

## 2014-10-27 MED ORDER — NYSTATIN 100000 UNIT/ML MT SUSP: 5.0000 mL | Freq: Four times a day (QID) | OROMUCOSAL | Status: DC

## 2014-10-27 MED ORDER — HYDROCODONE-HOMATROPINE 5-1.5 MG/5ML PO SYRP
5.0000 mL | ORAL_SOLUTION | Freq: Every evening | ORAL | Status: DC | PRN
Start: 2014-10-27 — End: 2015-01-14

## 2014-10-27 MED ORDER — DOXYCYCLINE HYCLATE 100 MG PO TABS: 100.0000 mg | ORAL_TABLET | Freq: Two times a day (BID) | ORAL | Status: DC

## 2014-10-27 NOTE — Assessment & Plan Note (Signed)
Treat with nystatin. Likely secondary to recent antibiotics.

## 2014-10-27 NOTE — Progress Notes (Signed)
   Subjective:    Patient ID: Kristin [name], female    DOB: 1940-12-14, 74 y.o.   MRN: [id number]  HPI  74 year old female presetns with continued cough.  She was seen on 3/8 by Dr. Darnell Level. Dx with acute viral bronchitis Supportive care. Has been using codeine cough syrup, delsym , helps some.  Given Z-pack to use if not improving. Completed 3 days ago  Pt reports she used this.. She felt improved until the antiboiotic was completed.  Constant cough, productive yellow gree. Frequent cough at night.  NO fever, no SOB.  Oc wheeze. No ear pain, no face pain.   No hx of lung disease. Nonsmoker. Sick contact grandson.    Review of Systems  Constitutional: Negative for fever and fatigue.  HENT: Negative for ear pain.   Eyes: Negative for pain.  Respiratory: Negative for chest tightness and shortness of breath.   Cardiovascular: Positive for chest pain. Negative for palpitations and leg swelling.       Some pain with coughing on left lower ribs.  Gastrointestinal: Negative for abdominal pain.  Genitourinary: Negative for dysuria.       Objective:   Physical Exam  Constitutional: Vital signs are normal. She appears well-developed and well-nourished. She is cooperative.  Non-toxic appearance. She does not appear ill. No distress.  HENT:  Head: Normocephalic.  Right Ear: Hearing, tympanic membrane, external ear and ear canal normal. Tympanic membrane is not erythematous, not retracted and not bulging.  Left Ear: Hearing, tympanic membrane, external ear and ear canal normal. Tympanic membrane is not erythematous, not retracted and not bulging.  Nose: Mucosal edema and rhinorrhea present. Right sinus exhibits no maxillary sinus tenderness and no frontal sinus tenderness. Left sinus exhibits no maxillary sinus tenderness and no frontal sinus tenderness.  Mouth/Throat: Uvula is midline, oropharynx is clear and moist and mucous membranes are normal. Oral lesions present. No oropharyngeal  exudate, posterior oropharyngeal edema or posterior oropharyngeal erythema.  White plaque on roof of mouth and tounge.  Eyes: Conjunctivae, EOM and lids are normal. Pupils are equal, round, and reactive to light. Lids are everted and swept, no foreign bodies found.  Neck: Trachea normal and normal range of motion. Neck supple. Carotid bruit is not present. No thyroid mass and no thyromegaly present.  Cardiovascular: Normal rate, regular rhythm, S1 normal, S2 normal, normal heart sounds, intact distal pulses and normal pulses.  Exam reveals no gallop and no friction rub.   No murmur heard. Pulmonary/Chest: Effort normal and breath sounds normal. No tachypnea. No respiratory distress. She has no decreased breath sounds. She has no wheezes. She has no rhonchi. She has no rales.  constatnt cough  Genitourinary: No vaginal discharge found.  Neurological: She is alert.  Skin: Skin is warm, dry and intact. No rash noted.  Psychiatric: Her speech is normal and behavior is normal. Judgment normal. Her mood appears not anxious. Cognition and memory are normal. She does not exhibit a depressed mood.          Assessment & Plan:

## 2014-10-27 NOTE — Progress Notes (Signed)
Pre visit review using our clinic review tool, if applicable. No additional management support is needed unless otherwise documented below in the visit note. 

## 2014-10-27 NOTE — Patient Instructions (Signed)
Complete doxycycline x 10 days.  Treat thrush with nystatin swish and spit. Change to Tussionex cough suppressant.

## 2014-10-27 NOTE — Assessment & Plan Note (Signed)
Broaden antibiotics.  Change cough suppressant to hydrocodone.

## 2014-12-21 ENCOUNTER — Telehealth: Payer: Self-pay

## 2014-12-21 DIAGNOSIS — M1990 Unspecified osteoarthritis, unspecified site: Secondary | ICD-10-CM

## 2014-12-21 DIAGNOSIS — L4050 Arthropathic psoriasis, unspecified: Secondary | ICD-10-CM

## 2014-12-21 NOTE — Telephone Encounter (Signed)
Okay to do at time of CPX labs. Get orders and diagnosis codes from rheum to make sure we obtain labs they want.

## 2014-12-21 NOTE — Telephone Encounter (Signed)
Lupay nurse with rheumatologist in [place]; rheumatologist wants to start pt on biologic and pt needs hep A,B and C panel and TB skin test; pt has lab appt on 01/06/15 for cpx labs and CPX scheduled on 01/14/15. Lupay will fax orders to [facility] lab if can be done here. Lupay request cb.

## 2014-12-22 NOTE — Addendum Note (Signed)
Addended by: Carter Kitten on: 12/22/2014 09:47 AM   Modules accepted: Orders

## 2014-12-22 NOTE — Telephone Encounter (Signed)
Lupay notified to fax lab orders with Dx. Codes and we would be happy to draw those labs when Kristin [name] comes in on 01/06/2015 for her CPE labs.

## 2014-12-22 NOTE — Telephone Encounter (Signed)
Orders received from [facility] and placed in Epic as Future Order for Quantiferon TB Gold & Acute Hepatitis Panel.  Fax#[phone].

## 2014-12-24 ENCOUNTER — Telehealth: Payer: Self-pay | Admitting: Family Medicine

## 2014-12-24 NOTE — Telephone Encounter (Signed)
Noted  

## 2014-12-24 NOTE — Telephone Encounter (Signed)
Patient has lab work scheduled on 01/06/15.  Patient said she wants to cancel the labs for tb test and hep b series.  Patient doesn't want to take Enbrel.

## 2014-12-25 NOTE — Addendum Note (Signed)
Addended by: Carter Kitten on: 12/25/2014 08:36 AM   Modules accepted: Orders

## 2014-12-25 NOTE — Telephone Encounter (Signed)
Future lab orders for TB and Hep B panel cancelled.

## 2014-12-28 ENCOUNTER — Other Ambulatory Visit: Payer: Self-pay

## 2014-12-28 MED ORDER — DILTIAZEM HCL ER 120 MG PO CP12: 120.0000 mg | ORAL_CAPSULE | Freq: Every day | ORAL | Status: DC

## 2014-12-28 MED ORDER — LOSARTAN POTASSIUM 25 MG PO TABS: 25.0000 mg | ORAL_TABLET | Freq: Every day | ORAL | Status: DC

## 2014-12-28 MED ORDER — METOPROLOL SUCCINATE ER 25 MG PO TB24: 25.0000 mg | ORAL_TABLET | Freq: Every day | ORAL | Status: DC

## 2014-12-28 NOTE — Telephone Encounter (Signed)
Refill sent for metoprolol succ 

## 2014-12-28 NOTE — Telephone Encounter (Signed)
Refill sent for Losartan potassium 25 mg.  

## 2014-12-28 NOTE — Telephone Encounter (Signed)
Refill sent for diltiazem hcl 120 mg

## 2015-01-06 ENCOUNTER — Telehealth: Payer: Self-pay | Admitting: Family Medicine

## 2015-01-06 ENCOUNTER — Other Ambulatory Visit (INDEPENDENT_AMBULATORY_CARE_PROVIDER_SITE_OTHER): Payer: Medicare Other

## 2015-01-06 DIAGNOSIS — E785 Hyperlipidemia, unspecified: Secondary | ICD-10-CM | POA: Diagnosis not present

## 2015-01-06 LAB — LIPID PANEL
Cholesterol: 208 mg/dL — ABNORMAL HIGH (ref 0–200)
HDL: 69.1 mg/dL (ref >39.00)
LDL Cholesterol: 116 mg/dL — ABNORMAL HIGH (ref 0–99)
NonHDL: 138.9
Total CHOL/HDL Ratio: 3
Triglycerides: 113 mg/dL (ref 0.0–149.0)
VLDL: 22.6 mg/dL (ref 0.0–40.0)

## 2015-01-06 LAB — COMPREHENSIVE METABOLIC PANEL
ALT: 19 U/L (ref 0–35)
AST: 24 U/L (ref 0–37)
Albumin: 4.2 g/dL (ref 3.5–5.2)
Alkaline Phosphatase: 46 U/L (ref 39–117)
BUN: 17 mg/dL (ref 6–23)
CO2: 32 mEq/L (ref 19–32)
Calcium: 9.4 mg/dL (ref 8.4–10.5)
Chloride: 104 mEq/L (ref 96–112)
Creatinine, Ser: 0.91 mg/dL (ref 0.40–1.20)
GFR: 64.18 mL/min (ref >60.00)
GLUCOSE: 90 mg/dL (ref 70–99)
POTASSIUM: 3.9 meq/L (ref 3.5–5.1)
SODIUM: 141 meq/L (ref 135–145)
Total Bilirubin: 0.4 mg/dL (ref 0.2–1.2)
Total Protein: 7.1 g/dL (ref 6.0–8.3)

## 2015-01-06 NOTE — Telephone Encounter (Signed)
-----   Message from Marchia Bond sent at 01/04/2015  4:08 PM EDT ----- Regarding: cpx labs Wed 5/25, need orders please :-) Please order  future cpx labs for pt's upcoming lab appt. Thanks Aniceto Boss

## 2015-01-14 ENCOUNTER — Ambulatory Visit (INDEPENDENT_AMBULATORY_CARE_PROVIDER_SITE_OTHER): Payer: Medicare Other | Admitting: Family Medicine

## 2015-01-14 ENCOUNTER — Encounter: Payer: Self-pay | Admitting: Family Medicine

## 2015-01-14 VITALS — BP 122/84 | HR 62 | Temp 97.8°F | Ht 64.0 in | Wt 146.5 lb

## 2015-01-14 DIAGNOSIS — I10 Essential (primary) hypertension: Secondary | ICD-10-CM

## 2015-01-14 DIAGNOSIS — R413 Other amnesia: Secondary | ICD-10-CM | POA: Diagnosis not present

## 2015-01-14 DIAGNOSIS — E785 Hyperlipidemia, unspecified: Secondary | ICD-10-CM | POA: Diagnosis not present

## 2015-01-14 DIAGNOSIS — Z0000 Encounter for general adult medical examination without abnormal findings: Secondary | ICD-10-CM

## 2015-01-14 DIAGNOSIS — Z7189 Other specified counseling: Secondary | ICD-10-CM | POA: Insufficient documentation

## 2015-01-14 NOTE — Progress Notes (Signed)
Pre visit review using our clinic review tool, if applicable. No additional management support is needed unless otherwise documented below in the visit note. 

## 2015-01-14 NOTE — Assessment & Plan Note (Signed)
Well controlled. Continue current medication.  

## 2015-01-14 NOTE — Progress Notes (Signed)
HPI  II have personally reviewed the Medicare Annual Wellness questionnaire and have noted 1. The patient's medical and social history 2. Their use of alcohol, tobacco or illicit drugs 3. Their current medications and supplements 4. The patient's functional ability including ADL's, fall risks, home safety risks and hearing or visual             impairment. 5. Diet and physical activities 6. Evidence for depression or mood disorders 7.         Updated provider list  The patients weight, height, BMI and visual acuity have been recorded in the chart  I have made referrals, counseling and provided education to the patient based review of the above and I have provided the pt with a written personalized care plan for preventive services.    Hypertension: Well controlled on losartan, diltiazem Using medication without problems or lightheadedness:SE to ACEI, clonidine and BBlockers in past. HCTZ stopped due to dizziness. She has had improvement in taking BP meds at dinner. BP Readings from Last 3 Encounters:  01/14/15 122/84  10/27/14 138/76  10/19/14 146/84  Chest pain with exertion:None  Edema:None  Short of breath:Mild with stairs, but stable  Average home TIW:PYKD low and some highs  Other issues:   Last OV from Dr. 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    Elevated Cholesterol: Improved now on no medication.[name] Hesitant about statins in general. LDL NOT at goal < 70 with history of MI. 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 Exercise: Unable to exercise given chronic fatigue. Other  complaints.   Fibromyalgia/inflammatory arthritis: Per rheumatology Dr. Titus Mould, Good control on tramadol 50 mg daily, does make her somewhat sleepy. MD recommended Enbrel.. She decided against it. She has chronic fatigue. No insomnia.  Also goes to acupuncturist.   HRT: with estradiol per Dr. Phineas Real. Has reviewed with him recently and has tried to stop patches but was not able to. She is on minimal dose.   Seeing Dr. Maureen Chatters for spleep apnea. No longer seeing her given CPAP was not working for her. Dr Dohmeier discharged her.  Review of Systems  Constitutional: Positive for fatigue. Negative for fever and unexpected weight change.  HENT: Negative for ear pain, congestion, sore throat, sneezing, trouble swallowing and sinus pressure.  Eyes: Negative for pain and itching.  Respiratory: Negative for cough, shortness of breath and wheezing.  Cardiovascular: Negative for chest pain, palpitations and leg swelling.  Gastrointestinal: Negative for nausea, Some intermittant 3/10, abdominal pain x 1 year in left upper quadrant Related to after eating, some gas no bloating, has history of [name], probiotic helps with this., diarrhea, constipation and blood in stool.  Genitourinary: Negative for dysuria, hematuria, vaginal discharge, difficulty urinating and menstrual problem.  Skin: Negative for rash.  Neurological: Dizziness improved. Negative for syncope, weakness, light-headedness, numbness and headaches.  Psychiatric/Behavioral: Negative for confusion and dysphoric mood. The patient is not nervous/anxious.  Objective:   Physical Exam  Constitutional: Vital signs are normal. She appears well-developed and well-nourished. She is cooperative. Non-toxic appearance. She does not appear ill. No distress.  HENT:  Head: Normocephalic.  Right Ear: Hearing, tympanic membrane,  external ear and ear canal normal.  Left Ear: Hearing, tympanic membrane, external ear and ear canal normal.   Nose: Mucosal edema present.  Mouth/Throat: Uvula is midline, oropharynx is clear and moist and mucous membranes are normal.  Eyes: Conjunctivae, EOM and lids are normal. Pupils are equal, round, and reactive to light. No foreign bodies found.  Neck: Trachea normal and normal range of motion. Neck supple. Carotid bruit is not present. No mass and no thyromegaly present.  Cardiovascular: Normal rate, regular rhythm, S1 normal, S2 normal, normal heart sounds and intact distal pulses. Exam reveals no gallop.  No murmur heard.  Pulmonary/Chest: Effort normal and breath sounds normal. No respiratory distress. She has no wheezes. She has no rhonchi. She has no rales.  Abdominal: Soft. Normal appearance and bowel sounds are normal. She exhibits no distension, no fluid wave, no abdominal bruit and no mass. There is no hepatosplenomegaly.mild tenderness in left upper quadrant. There is no rebound, no guarding and no CVA tenderness. No hernia.  Lymphadenopathy:  She has no cervical adenopathy.  She has no axillary adenopathy.  Neurological: She is alert. She has normal strength. No cranial nerve deficit or sensory deficit.  Skin: Skin is warm, dry and intact. No rash noted.  Multiple moles, no concerning skin lesions  Psychiatric: Her speech is normal and behavior is normal. Judgment normal. Her mood appears not anxious. Cognition and memory are normal. She does not exhibit a depressed mood.  Breast exam Bilaterally, no masses. Assessment & Plan:   AMW: The patient's preventative maintenance and recommended screening tests for an annual wellness exam were reviewed in full today.  Brought up to date unless services declined.  Counselled on the importance of diet, exercise, and its role in overall health and mortality.  The patient's FH and SH was reviewed, including their home life, tobacco status, and drug and alcohol status.   Vaccines: uptodate with zoster, PNA, Flu. Due for  tdap. Mammogram: 04/2014 nml , discussed stopping at age 74. DVE/pap: not indicated, given age and S/P vaginal hysterectomy for noncancer reason , sees GYN  But plans on stopping this year. Colon: 2011.. Nml, likely no repeat needed.  DEXA: 03/2011 at GYN nml. Nonsmoker

## 2015-01-14 NOTE — Assessment & Plan Note (Signed)
If continued issue, she will make appt for MMSE and further eval.

## 2015-01-14 NOTE — Patient Instructions (Addendum)
Work on walking as able.  Look into Tdap coverage (tetanus)  If memory remains concerning for you.. Make an appt here for memory evaluation.  Start red yeast rice 600 mg 2 tabs twice daily.

## 2015-01-14 NOTE — Assessment & Plan Note (Signed)
Improved control.. Not quite at goal < 70 on no med. She reuses statin medication.Blanch Media to try red yeast rice.

## 2015-01-21 ENCOUNTER — Other Ambulatory Visit: Payer: Self-pay | Admitting: *Deleted

## 2015-01-21 MED ORDER — TRAMADOL HCL 50 MG PO TABS: 50.0000 mg | ORAL_TABLET | Freq: Three times a day (TID) | ORAL | Status: DC | PRN

## 2015-01-21 NOTE — Telephone Encounter (Signed)
Last office visit 01/14/2015.  Last refilled 10/22/2014 for #90 with 0 refills.  Ok to refill?

## 2015-01-21 NOTE — Telephone Encounter (Signed)
Called to [employer].

## 2015-01-21 NOTE — Telephone Encounter (Signed)
Ok, 90, 0 ref 

## 2015-02-01 ENCOUNTER — Encounter: Payer: Self-pay | Admitting: Cardiovascular Disease

## 2015-02-01 ENCOUNTER — Ambulatory Visit (INDEPENDENT_AMBULATORY_CARE_PROVIDER_SITE_OTHER): Payer: Medicare Other | Admitting: Cardiovascular Disease

## 2015-02-01 VITALS — BP 144/72 | HR 55 | Ht 64.0 in | Wt 147.2 lb

## 2015-02-01 DIAGNOSIS — E785 Hyperlipidemia, unspecified: Secondary | ICD-10-CM | POA: Diagnosis not present

## 2015-02-01 DIAGNOSIS — I493 Ventricular premature depolarization: Secondary | ICD-10-CM

## 2015-02-01 DIAGNOSIS — I10 Essential (primary) hypertension: Secondary | ICD-10-CM | POA: Diagnosis not present

## 2015-02-01 DIAGNOSIS — I252 Old myocardial infarction: Secondary | ICD-10-CM

## 2015-02-01 NOTE — Assessment & Plan Note (Signed)
Lab Results  Component Value Date   CHOL 208* 01/06/2015   HDL 69.10 01/06/2015   LDLCALC 116* 01/06/2015   LDLDIRECT 158.1 10/02/2013   TRIG 113.0 01/06/2015   CHOLHDL 3 01/06/2015   Given that cardiac catheterization in 2013 showed no significant coronary artery disease, I think it's reasonable to continue with lifestyle changes. LDL was improved from before.

## 2015-02-01 NOTE — Patient Instructions (Signed)
Medication Instructions: Stop taking Metoprolol (Toprol).  Continue other medications.   Labwork: None.   Procedures/Testing: None.   Follow-Up: 1 year with Dr. Fletcher Anon.   Any Additional Special Instructions Will Be Listed Below (If Applicable).

## 2015-02-01 NOTE — Assessment & Plan Note (Signed)
No evidence of recurrent arrhythmia. She is mildly bradycardic and she complains of dizziness after she takes metoprolol and diltiazem. Thus, I decided to discontinue metoprolol.

## 2015-02-01 NOTE — Assessment & Plan Note (Signed)
The etiology was presumed to be due to coronary spasm and not atherosclerosis. No recurrent chest pain.

## 2015-02-01 NOTE — Progress Notes (Signed)
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  She had cardiac catheterization in 05/2012 which showed no significant [name]. She has known palpitations due to PVCs and ventricular bigeminy.  She has been doing well from a cardiac standpoint overall with no significant palpitations. PVCs are controlled with Toprol and diltiazem.  She was diagnosed with psoriatic arthritis and she is considering taking Enbrel.  She complains of dizziness after she takes metoprolol and diltiazem.  Allergies  Allergen Reactions  . Ace Inhibitors     REACTION: Boils  . Angiotensin Receptor Blockers   . Latex     REACTION: Rash, blisters, breathing probs.  . Penicillins     REACTION: Hives, swellling     Current Outpatient Prescriptions on File Prior to Visit  Medication Sig Dispense Refill  . acetaminophen (TYLENOL) 325 MG tablet Take 325 mg by mouth every 8 (eight) hours as needed (pain).     [name] aspirin EC 81 MG tablet Take 81 mg by mouth daily.    . Calcium Carbonate-Vitamin D (CALTRATE 600+D PO) Take 1 tablet by mouth 2 (two) times daily.     . Coenzyme Q10 200 MG capsule Take 200 mg by mouth daily.      [name] diltiazem (CARDIZEM SR) 120 MG 12 hr capsule Take 1 capsule (120 mg total) by mouth daily. 30 capsule 11  . Doxylamine Succinate, Sleep, (UNISOM) 25 MG tablet Take 12.5 mg by mouth at bedtime as needed for sleep.     [name] losartan (COZAAR) 25 MG tablet Take 1 tablet (25 mg total) by mouth daily. 30 tablet 11  . Magnesium 250 MG TABS Take 250 mg by mouth daily.     . Multiple Vitamin (MULTIVITAMIN) tablet Take 1 tablet by mouth daily.      [name] neomycin-polymyxin-dexameth (MAXITROL) 0.1 % OINT Place 1 application into both eyes at bedtime.    . Omega-3 Fatty Acids (FISH OIL)  1200 MG CAPS Take 1 capsule by mouth 3 (three) times daily.     [name] OVER THE COUNTER MEDICATION Take 1 capsule by mouth daily. Estroven herbal supplement    . Polyethyl Glycol-Propyl Glycol (SYSTANE) 0.4-0.3 % SOLN Apply 1 drop to eye daily as needed (dry eyes).     . Probiotic Product ([place]) CAPS Take 1 capsule by mouth daily.    . traMADol (ULTRAM) 50 MG tablet Take 1 tablet (50 mg total) by mouth every 8 (eight) hours as needed. 90 tablet 0  . triamcinolone cream (KENALOG) 0.1 % Apply 1 application topically 2 (two) times daily.   0  . vitamin B-12 (CYANOCOBALAMIN) 1000 MCG tablet Take 1,000 mcg by mouth daily.     No current facility-administered medications on file prior to visit.     Past Medical History  Diagnosis Date  . Rosacea   . Degeneration of lumbar or lumbosacral intervertebral disc   . Myalgia and myositis, unspecified   . Old myocardial infarction 1996  . Obstructive sleep apnea (adult) (pediatric)   . Osteoarthrosis, unspecified whether generalized or localized, unspecified site   . Stroke     ischaemic microvascular disease  . Arthritis     recent falls -aug 2010  . [name] (coronary artery disease)     mi-1996  . Subarachnoid hemorrhage due  to ruptured aneurysm     1991 , bleed and dizziness   . Unspecified essential hypertension     takes Diltiazem,Metoprolol,and Losartan daily  . Complication of anesthesia     pt states takes a long time to wake her wake up  . Fibromyalgia   . Rheumatoid arthritis   . Cough     no fever   . Seasonal allergies   . Dizziness     r/t meds  . Joint pain   . Joint swelling   . Osteoarthritis   . [name] (irritable bowel syndrome)     takes OTC [place] Negus colon health  . UTI (lower urinary tract infection)     frequent=last one was winter of 2014  . History of shingles      Past Surgical History  Procedure Laterality Date  . Ethmoidectomy  1998  . Sinus biopsy  1998    inverted papiloma  . Ct abd wo/w &  pelvis wo cm  2013    no acute process, + minimal diverticulosis and hepatic/renal cysts, ATH calcifications  . Oophorectomy      BSO  . Cardiac catheterization  1996    vasospams; no blockage  . Cardiac catheterization  2013    No obstructive coronary artery disease. Mild distal LAD and proximal RCA disease.  . Atherectomy  1996  . Cataract extraction Bilateral   . Nasal sinus surgery  1998  . Vaginal hysterectomy  1992    for mennorhagia-LAVH  . Colonoscopy    . 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 Colon cancer Father   . Heart attack Mother   . Coronary artery disease Mother   . Osteoporosis Mother   . Heart disease Mother   . Heart failure Mother   . GER disease Sister   . Hypertension Sister   . Osteopenia Sister   . Breast cancer Paternal Aunt     age 39  . Heart failure Maternal Grandmother   . Heart failure Maternal Grandfather      History   Social History  . Marital Status: Married    Spouse Name: N/A  . Number of Children: 2  . Years of Education: BSN   Occupational History  . 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     Social History Main Topics  . Smoking status: Never Smoker   . Smokeless tobacco: Never Used  . Alcohol Use: No  . Drug Use: No  . Sexual Activity: Yes    Birth Control/ Protection: Surgical   Other Topics Concern  . Not on file   Social History Narrative   No regular exercise-used to do [place] will.. She is DNR, Kristin [name] is husband Kristin [name] , then son Kristin [name]       10/22/12-cardiac work up found the reason for her dizziness,  she needs no neurological follow up for that.  her enthusiasm for CPAP remains limited and is explained by her resiudal high AHI after she finally reached  compliance ,Epworth 13 points.     95% pressure is 11.7 cm , AHI  20 .7- she does not benefit from CPAP . She is  allowed to d/c the machine and we need to concerntrate on her dizziness and facial right droop. ordered MRI brain repeat.    Eye exam next week. PT at [facility]. MRI brain in April- per patients request order next month.                PHYSICAL EXAM   BP 144/72 mmHg  Pulse 55  Ht 5\' 4"  (1.626 m)  Wt 147 lb 4 oz (66.792 kg)  BMI 25.26 kg/m2  LMP 08/14/1989 Constitutional: She is oriented to person, place, and time. She appears well-developed and well-nourished. No distress.  HENT: No nasal discharge.  Head: Normocephalic and atraumatic.  Eyes: Pupils are equal and round. Right eye exhibits no discharge. Left eye exhibits no discharge.  Neck: Normal range of motion. Neck supple. No JVD present. No thyromegaly present.  Cardiovascular: Normal rate, regular rhythm , normal heart sounds. Exam reveals no gallop and no friction rub. No murmur heard.  Pulmonary/Chest: Effort normal and breath sounds normal. No stridor. No respiratory distress. She has no wheezes. She has no rales. She exhibits no tenderness.  Abdominal: Soft. Bowel sounds are normal. She exhibits no distension. There is no tenderness. There is no rebound and no guarding.  Musculoskeletal: Normal range of motion. She exhibits no edema and no tenderness.  Neurological: She is alert and oriented to person, place, and time. Coordination normal.  Skin: Skin is warm and dry. No rash noted. She is not diaphoretic. No erythema. No pallor.  Psychiatric: She has a normal mood and affect. Her behavior is normal. Judgment and thought content normal.    EKG: Sinus  Bradycardia  Voltage criteria for LVH  (R(V5) exceeds 2.60 mV).   -Nonspecific ST depression  -Seen with left ventricular hypertrophy (strain) or digitalis effect.   ABNORMAL   ASSESSMENT AND PLAN

## 2015-02-01 NOTE — Assessment & Plan Note (Signed)
Blood pressure is reasonably controlled. 

## 2015-02-12 ENCOUNTER — Encounter: Payer: Self-pay | Admitting: Family Medicine

## 2015-02-12 ENCOUNTER — Ambulatory Visit (INDEPENDENT_AMBULATORY_CARE_PROVIDER_SITE_OTHER): Payer: Medicare Other | Admitting: Family Medicine

## 2015-02-12 ENCOUNTER — Ambulatory Visit (INDEPENDENT_AMBULATORY_CARE_PROVIDER_SITE_OTHER)
Admission: RE | Admit: 2015-02-12 | Discharge: 2015-02-12 | Disposition: A | Discharge status: Home / Self Care | Payer: Medicare Other | Source: Ambulatory Visit | Attending: Family Medicine | Admitting: Family Medicine

## 2015-02-12 VITALS — BP 138/82 | HR 75 | Temp 98.4°F | Ht 64.0 in | Wt 149.8 lb

## 2015-02-12 DIAGNOSIS — M79672 Pain in left foot: Secondary | ICD-10-CM

## 2015-02-12 LAB — URIC ACID: Uric Acid, Serum: 5 mg/dL (ref 2.4–7.0)

## 2015-02-12 NOTE — Patient Instructions (Signed)
Continue current meds.  Elevate foot, apply ice.  We will call with X-ray  and lab results.

## 2015-02-12 NOTE — Progress Notes (Signed)
   Subjective:    Patient ID: Kristin [name], female    DOB: Dec 27, 1940, 74 y.o.   MRN: [id number]  HPI   74 year old female presents with new onset  Left foot pain following trip to [place]. Had foot swelling bilaterally. Walked around a lot.  Noted blister 4 days ago on top of foot for rubbing of shoes with swelling. No known falls or injury.  Saw derm yesterday. Given redness spreading in last 2 days. No fever.  Started on keflex for cellulitis, but also given antifungal cream for toenails.  Concern about fracture given pain with moving toes.  She is able to put some weight on it, pain with standing.  She is elevating as much as possible.      Social History /Family History/Past Medical History reviewed and updated if needed.  No diabetes.    Review of Systems  Constitutional: Negative for fever and fatigue.  HENT: Negative for ear pain.   Eyes: Negative for pain.  Respiratory: Negative for cough and shortness of breath.        Objective:   Physical Exam  Constitutional: Vital signs are normal. She appears well-developed and well-nourished. She is cooperative.  Non-toxic appearance. She does not appear ill. No distress.  HENT:  Head: Normocephalic.  Right Ear: Hearing, tympanic membrane, external ear and ear canal normal. Tympanic membrane is not erythematous, not retracted and not bulging.  Left Ear: Hearing, tympanic membrane, external ear and ear canal normal. Tympanic membrane is not erythematous, not retracted and not bulging.  Nose: No mucosal edema or rhinorrhea. Right sinus exhibits no maxillary sinus tenderness and no frontal sinus tenderness. Left sinus exhibits no maxillary sinus tenderness and no frontal sinus tenderness.  Mouth/Throat: Uvula is midline, oropharynx is clear and moist and mucous membranes are normal.  Eyes: Conjunctivae, EOM and lids are normal. Pupils are equal, round, and reactive to light. Lids are everted and swept, no foreign bodies found.    Neck: Trachea normal and normal range of motion. Neck supple. Carotid bruit is not present. No thyroid mass and no thyromegaly present.  Cardiovascular: Normal rate, regular rhythm, S1 normal, S2 normal, normal heart sounds, intact distal pulses and normal pulses.  Exam reveals no gallop and no friction rub.   No murmur heard. Pulmonary/Chest: Effort normal and breath sounds normal. No tachypnea. No respiratory distress. She has no decreased breath sounds. She has no wheezes. She has no rhonchi. She has no rales.  Abdominal: Soft. Normal appearance and bowel sounds are normal. There is no tenderness.  Musculoskeletal:       Left ankle: Normal.       Left foot: There is decreased range of motion, tenderness, bony tenderness and swelling.  Erythema, no warmth over base of third toe, very ttp light touch over entire dorsal foot , greatest at area of erythema.   1 plus edema bilateral ankles  Neurological: She is alert.  Skin: Skin is warm, dry and intact. No rash noted.  Psychiatric: Her speech is normal and behavior is normal. Judgment and thought content normal. Her mood appears not anxious. Cognition and memory are normal. She does not exhibit a depressed mood.          Assessment & Plan:

## 2015-02-12 NOTE — Addendum Note (Signed)
Addended by: Ellamae Sia on: 02/12/2015 10:48 AM   Modules accepted: Orders

## 2015-02-12 NOTE — Assessment & Plan Note (Signed)
Currently being treated for cellulitis. Will eval for gout and fracture.  Elevate ice and NSAIDs prn pain. Limited weight bearing until X-ray returns.

## 2015-02-12 NOTE — Progress Notes (Signed)
Pre visit review using our clinic review tool, if applicable. No additional management support is needed unless otherwise documented below in the visit note. 

## 2015-02-18 ENCOUNTER — Telehealth: Payer: Self-pay

## 2015-02-18 NOTE — Telephone Encounter (Signed)
Pt left v/m; pt was seen 02/12/15; pt has finished abx; toes still red and draining; pt wants to know if needs refill on abx. Medicap. Pt request cb.

## 2015-02-19 NOTE — Telephone Encounter (Signed)
Needs appt to be seen and re-eval'd

## 2015-02-19 NOTE — Telephone Encounter (Signed)
[phone] Pt called to see what she needs to do about her toe

## 2015-02-19 NOTE — Telephone Encounter (Signed)
Appointment scheduled 02/23/2015 with Dr. Diona Browner.

## 2015-02-23 ENCOUNTER — Encounter: Payer: Self-pay | Admitting: Family Medicine

## 2015-02-23 ENCOUNTER — Ambulatory Visit
Admission: RE | Admit: 2015-02-23 | Discharge: 2015-02-23 | Disposition: A | Discharge status: Home / Self Care | Payer: Medicare Other | Source: Ambulatory Visit | Attending: Family Medicine | Admitting: Family Medicine

## 2015-02-23 ENCOUNTER — Ambulatory Visit: Payer: Medicare Other

## 2015-02-23 ENCOUNTER — Ambulatory Visit (INDEPENDENT_AMBULATORY_CARE_PROVIDER_SITE_OTHER): Payer: Medicare Other | Admitting: Family Medicine

## 2015-02-23 VITALS — BP 140/80 | HR 78 | Temp 98.0°F | Ht 64.0 in | Wt 148.5 lb

## 2015-02-23 DIAGNOSIS — M79604 Pain in right leg: Secondary | ICD-10-CM | POA: Diagnosis not present

## 2015-02-23 DIAGNOSIS — L84 Corns and callosities: Secondary | ICD-10-CM

## 2015-02-23 DIAGNOSIS — M7989 Other specified soft tissue disorders: Secondary | ICD-10-CM | POA: Diagnosis present

## 2015-02-23 DIAGNOSIS — M79661 Pain in right lower leg: Secondary | ICD-10-CM | POA: Diagnosis not present

## 2015-02-23 DIAGNOSIS — M79672 Pain in left foot: Secondary | ICD-10-CM

## 2015-02-23 MED ORDER — DOXYCYCLINE HYCLATE 100 MG PO TABS: 100.0000 mg | ORAL_TABLET | Freq: Two times a day (BID) | ORAL | Status: DC

## 2015-02-23 NOTE — Addendum Note (Signed)
Addended by: Eliezer Lofts E on: 02/23/2015 09:15 AM   Modules accepted: Orders

## 2015-02-23 NOTE — Addendum Note (Signed)
Addended by: Eliezer Lofts E on: 02/23/2015 10:31 AM   Modules accepted: Orders

## 2015-02-23 NOTE — Assessment & Plan Note (Signed)
New pain and knot in right calf. With decrease in ambulation .[name] Worrisome for DVT or at least superficial phlebitis. Will eval with dopplers.

## 2015-02-23 NOTE — Progress Notes (Signed)
   Subjective:    Patient ID: Kristin [name], female    DOB: Dec 06, 1940, 74 y.o.   MRN: [id number]  HPI  74 year old female pt with blister and redness appearing on back of  leftfoot after long trip with walking , car trip, on feet a long time.  Saw Derm initially and was placed on keflex x 7 days.  Here we  evaluated on 02/11/2105... Uric acid nml.  Recommended taking antibiotics as recommended by Derm.  Today she reports decrease in redness, still with pain with walking. She now has a knot at base of third toe. Still with swelling off and on in legs.  She has been walking differently, limping some, Not moving around as much. Now with aching in right calf in last week, has noted knot in medial and posterior right calf. Tender to touch.    Review of Systems  Constitutional: Negative for fever and fatigue.  HENT: Negative for ear pain.   Eyes: Negative for pain.  Respiratory: Negative for cough.   Cardiovascular: Negative for chest pain.  Gastrointestinal: Negative for abdominal pain.       Objective:   Physical Exam  Constitutional: Vital signs are normal. She appears well-developed and well-nourished. She is cooperative.  Non-toxic appearance. She does not appear ill. No distress.  HENT:  Head: Normocephalic.  Right Ear: Hearing, tympanic membrane, external ear and ear canal normal. Tympanic membrane is not erythematous, not retracted and not bulging.  Left Ear: Hearing, tympanic membrane, external ear and ear canal normal. Tympanic membrane is not erythematous, not retracted and not bulging.  Nose: No mucosal edema or rhinorrhea. Right sinus exhibits no maxillary sinus tenderness and no frontal sinus tenderness. Left sinus exhibits no maxillary sinus tenderness and no frontal sinus tenderness.  Mouth/Throat: Uvula is midline, oropharynx is clear and moist and mucous membranes are normal.  Eyes: Conjunctivae, EOM and lids are normal. Pupils are equal, round, and reactive to  light. Lids are everted and swept, no foreign bodies found.  Neck: Trachea normal and normal range of motion. Neck supple. Carotid bruit is not present. No thyroid mass and no thyromegaly present.  Cardiovascular: Normal rate, regular rhythm, S1 normal, S2 normal, normal heart sounds, intact distal pulses and normal pulses.  Exam reveals no gallop and no friction rub.   No murmur heard. Pulmonary/Chest: Effort normal and breath sounds normal. No tachypnea. No respiratory distress. She has no decreased breath sounds. She has no wheezes. She has no rhonchi. She has no rales.  Abdominal: Soft. Normal appearance and bowel sounds are normal. There is no tenderness.  Musculoskeletal:  Tenderness in right posterior calf, medial firmness noted, tender, increase in warmth Swelling 1 plus edema in bilateral legs.   Neurological: She is alert.  Skin: Skin is warm, dry and intact. No rash noted.  Redness at basel of 3rd digit, focal tenderness, large callus at base of 3rd digit  Psychiatric: Her speech is normal and behavior is normal. Judgment and thought content normal. Her mood appears not anxious. Cognition and memory are normal. She does not exhibit a depressed mood.          Assessment & Plan:

## 2015-02-23 NOTE — Progress Notes (Signed)
Pre visit review using our clinic review tool, if applicable. No additional management support is needed unless otherwise documented below in the visit note. 

## 2015-02-23 NOTE — Assessment & Plan Note (Signed)
[  pt feels redness is some better with keflex. GIven this will cover for cellulitis.  X-ray neg, uric acid nml.  Appears callus at base of 3rd digiti is changing pressure on foot... This may be cause of pain and redness.  Refer to podiatry for assistance.

## 2015-02-23 NOTE — Patient Instructions (Signed)
Change to doxycycline  100 mg twice daily to treat possible cellulitis in foot.  Stop at front desk for referral to podiatry for foot pain.  Referral specialist will also set up dopplers on right calf.

## 2015-03-01 ENCOUNTER — Ambulatory Visit: Payer: Medicare Other

## 2015-03-01 ENCOUNTER — Encounter: Payer: Self-pay | Admitting: Internal Medicine

## 2015-03-08 ENCOUNTER — Telehealth: Payer: Self-pay | Admitting: Internal Medicine

## 2015-03-08 NOTE — Telephone Encounter (Signed)
PATIENT PCP DR Diona Browner RECOMMEND THAT THE PATIENT DID NOT NEED A FOLLOW UP TCS AND THE PATIENT STATED THAT IF SHE HAD ANY PROBLEMS SHE WOULD CONTACT us OR HER PCP WOULD.

## 2015-03-08 NOTE — Telephone Encounter (Signed)
Noted  

## 2015-03-11 ENCOUNTER — Ambulatory Visit (INDEPENDENT_AMBULATORY_CARE_PROVIDER_SITE_OTHER): Payer: Medicare Other | Admitting: Podiatry

## 2015-03-11 VITALS — BP 166/85 | HR 72 | Resp 16 | Ht 64.0 in | Wt 148.0 lb

## 2015-03-11 DIAGNOSIS — L84 Corns and callosities: Secondary | ICD-10-CM | POA: Diagnosis not present

## 2015-03-11 DIAGNOSIS — B351 Tinea unguium: Secondary | ICD-10-CM | POA: Diagnosis not present

## 2015-03-11 DIAGNOSIS — M79676 Pain in unspecified toe(s): Secondary | ICD-10-CM

## 2015-03-11 DIAGNOSIS — I10 Essential (primary) hypertension: Secondary | ICD-10-CM | POA: Diagnosis not present

## 2015-03-11 NOTE — Progress Notes (Signed)
Subjective:     Patient ID: [name], female   DOB: September 09, 1940, 74 y.o.   MRN: [id number]  HPI 74 year old female presents the office today for concerns of the callus which turned into a blister on the bottom of her third toe. She previously done 2 rounds of antibiotic's she states that after starting antibiotic she has noticed redness decrease in the top of her foot and her toe. She does keep have some residual redness to the top of the third toe although she states it is significantly improved. She denies any drainage from around the callus/blister site. She also states that she has nail fungus and is inquiring about possible treatments. She states the nails are painful with a running shoes and elongated. Denies any redness or drainage on the area. No other complaints at this time.  Review of Systems  All other systems reviewed and are negative.      Objective:   Physical Exam AAO x3, NAD DP/PT pulses palpable bilaterally, CRT less than 3 seconds Protective sensation intact with Simms Weinstein monofilament, vibratory sensation intact, Achilles tendon reflex intact On the plantar aspect of the left third digit there is a hyperkeratotic lesion. Upon debridement no underlying ulceration, drainage or other clinical signs of infection. There is a faint amount of erythema on the dorsal aspect of the third digit although appears that this erythema is resolving. There is no significant edema to the digit. There is no ascending cellulitis. No areas of fluctuance or crepitus. No malodor. No drainage or purulence. Nails are hypertrophic, dystrophic, discolored, elongated 10. There is tenderness over nails 1-5 bilaterally. No surrounding erythema or drainage. No other areas of tenderness to bilateral lower extremities. MMT 5/5, ROM WNL. Hammertoe contractures present. No open lesions or pre-ulcerative lesions.  No overlying edema, erythema, increase in warmth to bilateral lower extremities.  No  pain with calf compression, swelling, warmth, erythema bilaterally.      Assessment:     74 year old female with hyperkeratotic lesion left third digit plantarly with apparently resolving erythema; symptomatic onychomycosis    Plan:     -Treatment options discussed including all alternatives, risks, and complications -Nail sharply debrided 10 without complication/pain. Discussed there is treatment options for onychomycosis with the patient. At this time she elects to hold off on any further treatment just observe the area for now. -Hyperkeratotic lesion sharply debrided 1 without complications/bleeding. There is a faint amount of erythema the dorsal aspect of the digit however objectively is much improved. She is oriented onto an antibiotic. We'll hold off on MRIs observe the area for now. Dispensed toe crest help offload the area to help prevent further blister/callus. -Follow-up in 2 weeks of the redness has not completely resolved or sooner if there is any worsening or any change in symptoms. Otherwise she'll follow up in 3 months for routine care. In the meantime I encouraged her to call the office with any questions, concerns, change in symptoms.  Celesta Gentile, DPM

## 2015-03-11 NOTE — Patient Instructions (Signed)
If redness has not resolved in 2 weeks, please call  Onychomycosis/Fungal Toenails  WHAT IS IT? An infection that lies within the keratin of your nail plate that is caused by a fungus.  WHY ME? Fungal infections affect all ages, sexes, races, and creeds.  There may be many factors that predispose you to a fungal infection such as age, coexisting medical conditions such as diabetes, or an autoimmune disease; stress, medications, fatigue, genetics, etc.  Bottom line: fungus thrives in a warm, moist environment and your shoes offer such a location.  IS IT CONTAGIOUS? Theoretically, yes.  You do not want to share shoes, nail clippers or files with someone who has fungal toenails.  Walking around barefoot in the same room or sleeping in the same bed is unlikely to transfer the organism.  It is important to realize, however, that fungus can spread easily from one nail to the next on the same foot.  HOW DO WE TREAT THIS?  There are several ways to treat this condition.  Treatment may depend on many factors such as age, medications, pregnancy, liver and kidney conditions, etc.  It is best to ask your doctor which options are available to you.  1. No treatment.   Unlike many other medical concerns, you can live with this condition.  However for many people this can be a painful condition and may lead to ingrown toenails or a bacterial infection.  It is recommended that you keep the nails cut short to help reduce the amount of fungal nail. 2. Topical treatment.  These range from herbal remedies to prescription strength nail lacquers.  About 40-50% effective, topicals require twice daily application for approximately 9 to 12 months or until an entirely new nail has grown out.  The most effective topicals are medical grade medications available through physicians offices. 3. Oral antifungal medications.  With an 80-90% cure rate, the most common oral medication requires 3 to 4 months of therapy and stays in your  system for a year as the new nail grows out.  Oral antifungal medications do require blood work to make sure it is a safe drug for you.  A liver function panel will be performed prior to starting the medication and after the first month of treatment.  It is important to have the blood work performed to avoid any harmful side effects.  In general, this medication safe but blood work is required. 4. Laser Therapy.  This treatment is performed by applying a specialized laser to the affected nail plate.  This therapy is noninvasive, fast, and non-painful.  It is not covered by insurance and is therefore, out of pocket.  The results have been very good with a 80-95% cure rate.  [facility] is the only practice in the area to offer this therapy. 5. Permanent Nail Avulsion.  Removing the entire nail so that a new nail will not grow back.

## 2015-04-02 ENCOUNTER — Ambulatory Visit (INDEPENDENT_AMBULATORY_CARE_PROVIDER_SITE_OTHER): Payer: Medicare Other | Admitting: Gynecology

## 2015-04-02 ENCOUNTER — Encounter: Payer: Self-pay | Admitting: Gynecology

## 2015-04-02 VITALS — BP 126/80 | Ht 64.0 in | Wt 145.0 lb

## 2015-04-02 DIAGNOSIS — N8111 Cystocele, midline: Secondary | ICD-10-CM | POA: Diagnosis not present

## 2015-04-02 DIAGNOSIS — Z01419 Encounter for gynecological examination (general) (routine) without abnormal findings: Secondary | ICD-10-CM | POA: Diagnosis not present

## 2015-04-02 DIAGNOSIS — N952 Postmenopausal atrophic vaginitis: Secondary | ICD-10-CM

## 2015-04-02 NOTE — Progress Notes (Signed)
Kristin [name] [birth year] [id number]        74 y.o.  [id number] for breast and pelvic exam. Several issues noted below.  Past medical history,surgical history, problem list, medications, allergies, family history and social history were all reviewed and documented as reviewed in the EPIC chart.  ROS:  Performed with pertinent positives and negatives included in the history, assessment and plan.   Additional significant findings :  none   Exam: Kim Counsellor Vitals:   04/02/15 1135  BP: 126/80  Height: 5\' 4"  (1.626 m)  Weight: 145 lb (65.772 kg)   General appearance:  Normal affect, orientation and appearance. Skin: Grossly normal HEENT: Without gross lesions.  No cervical or supraclavicular adenopathy. Thyroid normal.  Lungs:  Clear without wheezing, rales or rhonchi Cardiac: RR, without RMG Abdominal:  Soft, nontender, without masses, guarding, rebound, organomegaly or hernia Breasts:  Examined lying and sitting without masses, retractions, discharge or axillary adenopathy. Pelvic:  Ext/BUS/vagina with atrophic changes. With mild cystocele  Adnexa  Without masses or tenderness    Anus and perineum  Normal   Rectovaginal  Normal sphincter tone without palpated masses or tenderness.    Assessment/Plan:  74 y.o. [id number] female for breast and pelvic exam..   1. Postmenopausal/atrophic genital changes. Status post LAVH BSO in the past for menorrhagia. Had been on ERT but discontinued. Had short trial of vaginal estrogen last year but did not like it. Doing well on Estrovin and wants to continue this. Follow up if any issues. 2. Mild cystocele. Patient asymptomatic. Continue to monitor with annual exams. Check urinalysis today. 3. Mammography 04/2014. Continue with annual mammography. SBE monthly reviewed. 4. Pap smear 2011. No Pap smear done today. We both agreed to stop screening given her age and hysterectomy for benign indications. 5. Colonoscopy 2011. Was told that she did not  need to repeat this given her age and negative colonoscopies in the past. 6. DEXA 2013 normal. Plan repeat at 5 year interval. Increased calcium vitamin D. 7. Health maintenance. No routine blood work done as this is done at her primary physician's office. She asked if her primary physician could do her GYN exam also in save her coming here. I discussed with her that this is fine as long as her primary feels comfortable with the breast and pelvic exam. She can follow up with me if there is any issues.   Anastasio Auerbach MD, 12:10 PM 04/02/2015

## 2015-04-02 NOTE — Patient Instructions (Signed)

## 2015-04-12 ENCOUNTER — Other Ambulatory Visit: Payer: Self-pay | Admitting: *Deleted

## 2015-04-12 MED ORDER — TRAMADOL HCL 50 MG PO TABS: 50.0000 mg | ORAL_TABLET | Freq: Three times a day (TID) | ORAL | Status: DC | PRN

## 2015-04-12 NOTE — Telephone Encounter (Signed)
Last office visit 02/23/2015.  Last refilled 01/21/2015 for #90 with no refills.  Ok to refill?

## 2015-04-13 NOTE — Telephone Encounter (Signed)
Called into [facility]. 

## 2015-04-16 ENCOUNTER — Other Ambulatory Visit (INDEPENDENT_AMBULATORY_CARE_PROVIDER_SITE_OTHER): Payer: Medicare Other

## 2015-04-16 DIAGNOSIS — E785 Hyperlipidemia, unspecified: Secondary | ICD-10-CM | POA: Diagnosis not present

## 2015-04-16 LAB — LIPID PANEL
CHOL/HDL RATIO: 3
Cholesterol: 209 mg/dL — ABNORMAL HIGH (ref 0–200)
HDL: 71.5 mg/dL (ref >39.00)
LDL Cholesterol: 121 mg/dL — ABNORMAL HIGH (ref 0–99)
NONHDL: 137.5
Triglycerides: 82 mg/dL (ref 0.0–149.0)
VLDL: 16.4 mg/dL (ref 0.0–40.0)

## 2015-04-20 ENCOUNTER — Other Ambulatory Visit: Payer: Self-pay | Admitting: Gynecology

## 2015-04-20 ENCOUNTER — Telehealth: Payer: Self-pay | Admitting: *Deleted

## 2015-04-20 DIAGNOSIS — Z1231 Encounter for screening mammogram for malignant neoplasm of breast: Secondary | ICD-10-CM

## 2015-04-20 NOTE — Telephone Encounter (Signed)
Pt called requesting mammogram and dexa order faxed to [facility] in [place]. I faxed order for dexa, per note on 04/02/15 "DEXA 2013 normal. Plan repeat at 5 year interval. So no order was faxed for dexa, I left on pt voicemail that repeat dexa in 5 year interval.

## 2015-04-30 ENCOUNTER — Ambulatory Visit
Admission: RE | Admit: 2015-04-30 | Discharge: 2015-04-30 | Disposition: A | Discharge status: Home / Self Care | Payer: Medicare Other | Source: Ambulatory Visit | Attending: Gynecology | Admitting: Gynecology

## 2015-04-30 DIAGNOSIS — Z1231 Encounter for screening mammogram for malignant neoplasm of breast: Secondary | ICD-10-CM

## 2015-06-30 ENCOUNTER — Telehealth: Payer: Self-pay | Admitting: Family Medicine

## 2015-06-30 DIAGNOSIS — S61419A Laceration without foreign body of unspecified hand, initial encounter: Secondary | ICD-10-CM

## 2015-06-30 DIAGNOSIS — Z23 Encounter for immunization: Secondary | ICD-10-CM

## 2015-06-30 MED ORDER — TETANUS-DIPHTH-ACELL PERTUSSIS 5-2.5-18.5 LF-MCG/0.5 IM SUSP: 0.5000 mL | Freq: Once | INTRAMUSCULAR | Status: DC

## 2015-06-30 NOTE — Telephone Encounter (Signed)
Rx for Tdap sent to [facility].  Kristin [name] notified prescription has been sent to her pharmacy.

## 2015-06-30 NOTE — Telephone Encounter (Signed)
Patient cut her hand yesterday.  Patient said she hasn't had a tetatnus shot in 12 years.  Patient wants a prescription for a tetanus shot be sent to [place] [facility]. Please call patient when prescription is sent.

## 2015-07-12 ENCOUNTER — Other Ambulatory Visit: Payer: Self-pay | Admitting: *Deleted

## 2015-07-12 NOTE — Telephone Encounter (Signed)
Last office visit 02/23/2015.  Last refilled 04/12/2015 for #90 with no refills.  Ok to refill?

## 2015-07-13 MED ORDER — TRAMADOL HCL 50 MG PO TABS: 50.0000 mg | ORAL_TABLET | Freq: Three times a day (TID) | ORAL | Status: DC | PRN

## 2015-07-13 NOTE — Telephone Encounter (Signed)
Tramadol called into [facility]. 

## 2015-07-19 ENCOUNTER — Other Ambulatory Visit: Payer: Medicare Other

## 2015-07-20 ENCOUNTER — Other Ambulatory Visit (INDEPENDENT_AMBULATORY_CARE_PROVIDER_SITE_OTHER): Payer: Medicare Other

## 2015-07-20 DIAGNOSIS — E785 Hyperlipidemia, unspecified: Secondary | ICD-10-CM

## 2015-07-20 LAB — LIPID PANEL
CHOL/HDL RATIO: 3
Cholesterol: 196 mg/dL (ref 0–200)
HDL: 61.6 mg/dL (ref >39.00)
LDL Cholesterol: 101 mg/dL — ABNORMAL HIGH (ref 0–99)
NONHDL: 134.3
Triglycerides: 169 mg/dL — ABNORMAL HIGH (ref 0.0–149.0)
VLDL: 33.8 mg/dL (ref 0.0–40.0)

## 2015-08-27 ENCOUNTER — Encounter: Payer: Self-pay | Admitting: Cardiovascular Disease

## 2015-08-27 ENCOUNTER — Ambulatory Visit (INDEPENDENT_AMBULATORY_CARE_PROVIDER_SITE_OTHER): Payer: PPO | Admitting: Cardiovascular Disease

## 2015-08-27 ENCOUNTER — Telehealth: Payer: Self-pay | Admitting: *Deleted

## 2015-08-27 VITALS — BP 182/90 | HR 70 | Ht 66.0 in | Wt 146.2 lb

## 2015-08-27 DIAGNOSIS — R5383 Other fatigue: Secondary | ICD-10-CM | POA: Diagnosis not present

## 2015-08-27 DIAGNOSIS — R0789 Other chest pain: Secondary | ICD-10-CM

## 2015-08-27 DIAGNOSIS — I25111 Atherosclerotic heart disease of native coronary artery with angina pectoris with documented spasm: Secondary | ICD-10-CM

## 2015-08-27 DIAGNOSIS — R0602 Shortness of breath: Secondary | ICD-10-CM | POA: Diagnosis not present

## 2015-08-27 DIAGNOSIS — I10 Essential (primary) hypertension: Secondary | ICD-10-CM

## 2015-08-27 DIAGNOSIS — I493 Ventricular premature depolarization: Secondary | ICD-10-CM

## 2015-08-27 HISTORY — DX: Atherosclerotic heart disease of native coronary artery with angina pectoris with documented spasm: I25.111

## 2015-08-27 MED ORDER — DILTIAZEM HCL ER COATED BEADS 180 MG PO CP24: 180.0000 mg | ORAL_CAPSULE | Freq: Every day | ORAL | Status: DC

## 2015-08-27 MED ORDER — LOSARTAN POTASSIUM 50 MG PO TABS
50.0000 mg | ORAL_TABLET | Freq: Every day | ORAL | Status: DC
Start: 2015-08-27 — End: 2015-09-15

## 2015-08-27 MED ORDER — NITROGLYCERIN 0.4 MG SL SUBL: 0.4000 mg | SUBLINGUAL_TABLET | SUBLINGUAL | Status: DC | PRN

## 2015-08-27 NOTE — Telephone Encounter (Signed)
Pt calling stating stating she is having chest pressure.  Having it about a week and has been putting it off. Not sure if she needs to come in or could this be muscle pains  Has had dizzy spells but that's normal for her she states Please advise Denies SOB/Chest pains.

## 2015-08-27 NOTE — Progress Notes (Signed)
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   Kristin [name] had cardiac catheterization in 05/2012 which showed no significant [name]. Only mild distal LAD and RCA disease Kristin [name] has known palpitations due to PVCs and ventricular bigeminy.  During last visit, I discontinued metoprolol due to symptomatic bradycardia. Kristin [name] Continued to be on diltiazem and losartan. Kristin [name] has been under significant stress lately as his son is going through marital difficulties and he has been staying with them intermittently. He suffers from PTSD related to the [place]. Since Christmas, Kristin [name] has been having intermittent episodes of central chest pain described as aching feeling radiating to Kristin [name] right arm. This has been associated with shortness of breath. The symptoms worsen with physical activities.  Allergies  Allergen Reactions  . Angiotensin Receptor Blockers   . Latex     REACTION: Rash, blisters, breathing probs.  . Penicillins     REACTION: Hives, swellling     Current Outpatient Prescriptions on File Prior to Visit  Medication Sig Dispense Refill  . acetaminophen (TYLENOL) 325 MG tablet Take 325 mg by mouth every 8 (eight) hours as needed (pain).     [name] aspirin EC 81 MG tablet Take 81 mg by mouth daily.    . Calcium Carbonate-Vitamin D (CALTRATE 600+D PO) Take 1 tablet by mouth 2 (two) times daily.     . Coenzyme Q10 200 MG capsule Take 200 mg by mouth daily.      . Doxylamine Succinate, Sleep, (UNISOM) 25 MG tablet Take 12.5 mg by mouth at bedtime as needed for sleep.     [name] econazole nitrate 1 % cream Apply 1 application topically 2 (two) times daily.    . Magnesium 250 MG TABS Take 250 mg by mouth daily.     . Multiple Vitamin (MULTIVITAMIN) tablet Take 1 tablet by mouth daily.       [name] neomycin-polymyxin-dexameth (MAXITROL) 0.1 % OINT Place 1 application into both eyes at bedtime.    . Omega-3 Fatty Acids (FISH OIL) 1200 MG CAPS Take 1 capsule by mouth 3 (three) times daily.     [name] OVER THE COUNTER MEDICATION Take 1 capsule by mouth daily. Estroven herbal supplement    . Polyethyl Glycol-Propyl Glycol (SYSTANE) 0.4-0.3 % SOLN Apply 1 drop to eye daily as needed (dry eyes).     . Probiotic Product ([place]) CAPS Take 1 capsule by mouth daily.    . Red Yeast Rice Extract (RED YEAST RICE PO) Take by mouth.    . Tdap (BOOSTRIX) 5-2.5-18.5 LF-MCG/0.5 injection Inject 0.5 mLs into the muscle once. 0.5 mL 0  . traMADol (ULTRAM) 50 MG tablet Take 1 tablet (50 mg total) by mouth every 8 (eight) hours as needed. 90 tablet 0  . triamcinolone cream (KENALOG) 0.1 % Apply 1 application topically 2 (two) times daily.   0  . vitamin B-12 (CYANOCOBALAMIN) 1000 MCG tablet Take 1,000 mcg by mouth daily.     No current facility-administered medications on file prior to visit.     Past Medical History  Diagnosis Date  . Rosacea   . Degeneration of lumbar or lumbosacral intervertebral disc   . Myalgia and myositis, unspecified   . Old myocardial infarction 1996  . Obstructive sleep apnea (adult) (pediatric)   .  Osteoarthrosis, unspecified whether generalized or localized, unspecified site   . Stroke [facility])     ischaemic microvascular disease  . Arthritis     recent falls -aug 2010  . [name] (coronary artery disease)     mi-1996  . 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HCC)     1991 , bleed and dizziness   . Unspecified essential hypertension     takes Diltiazem,Metoprolol,and Losartan daily  . Complication of anesthesia     pt states takes a long time to wake Kristin [name] wake up  . Fibromyalgia   . Rheumatoid arthritis ([place])   . Cough     no fever   . Seasonal allergies   . Dizziness     r/t meds  . Joint pain   . Joint swelling   . Osteoarthritis   . [name]  (irritable bowel syndrome)     takes OTC Hardin Negus colon health  . UTI (lower urinary tract infection)     frequent=last one was winter of 2014  . History of shingles   . Psoriatic arthritis [facility])      Past Surgical History  Procedure Laterality Date  . Ethmoidectomy  1998  . Sinus biopsy  1998    inverted papiloma  . Ct abd wo/w & pelvis wo cm  2013    no acute process, + minimal diverticulosis and hepatic/renal cysts, ATH calcifications  . Oophorectomy      BSO  . Cardiac catheterization  1996    vasospams; no blockage  . Cardiac catheterization  2013    No obstructive coronary artery disease. Mild distal LAD and proximal RCA disease.  . Atherectomy  1996  . Cataract extraction Bilateral   . Nasal sinus surgery  1998  . Colonoscopy    . Lipoma excision N/A 08/19/2014    Procedure: EXCISION OF MULTIPLE FACIAL CANCER LESIONS AND SOME WITH FLAP CLOSURE ;  Surgeon: Izora Gala, MD;  Location: [facility] OR;  Service: ENT;  Laterality: N/A;  . Vaginal hysterectomy  1992    for mennorhagia-LAVH BSO     Family History  Problem Relation Age of Onset  . Colon cancer Father   . Heart attack Mother   . Coronary artery disease Mother   . Osteoporosis Mother   . Heart disease Mother   . Heart failure Mother   . GER disease Sister   . Hypertension Sister   . Osteopenia Sister   . GER disease Sister   . Breast cancer Paternal Aunt     age 59  . Heart failure Maternal Grandmother   . Heart failure Maternal Grandfather      Social History   Social History  . Marital Status: Married    Spouse Name: N/A  . Number of Children: 2  . Years of Education: BSN   Occupational History  . 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     Social History Main Topics  . Smoking status: Never Smoker   . Smokeless tobacco: Never Used  . Alcohol Use: 0.0 oz/week    0 Standard drinks or equivalent per week     Comment: Rare  . Drug Use: No  . Sexual Activity: Not Currently    Birth  Control/ Protection: Surgical     Comment: 1st intercourse 60 yo-2 partners   Other Topics Concern  . Not on file   Social History Narrative   No regular exercise-used to do [place]  diet      Living will.. Kristin [name] is DNR, [name] is husband [name] , then son [name]       10/22/12-cardiac work up found the reason for Kristin [name] dizziness,  Kristin [name] needs no neurological follow up for that.  Kristin [name] enthusiasm for CPAP remains limited and is explained by Kristin [name] resiudal high AHI after Kristin [name] finally reached compliance ,[place] 13 points.     95% pressure is 11.7 cm , AHI  20 .7- Kristin [name] does not benefit from CPAP . Kristin [name] is allowed to d/c the machine and we need to concerntrate on Kristin [name] dizziness and facial right droop. ordered MRI brain repeat.    Eye exam next week. PT at [facility]. MRI brain in April- per patients request order next month.                PHYSICAL EXAM   BP 182/90 mmHg  Pulse 70  Ht 5\' 6"  (1.676 m)  Wt 146 lb 4 oz (66.339 kg)  BMI 23.62 kg/m2  LMP 08/14/1989 Constitutional: Kristin [name] is oriented to person, place, and time. Kristin [name] appears well-developed and well-nourished. No distress.  HENT: No nasal discharge.  Head: Normocephalic and atraumatic.  Eyes: Pupils are equal and round. Right eye exhibits no discharge. Left eye exhibits no discharge.  Neck: Normal range of motion. Neck supple. No JVD present. No thyromegaly present.  Cardiovascular: Normal rate, regular rhythm , normal heart sounds. Exam reveals no gallop and no friction rub. No murmur heard.  Pulmonary/Chest: Effort normal and breath sounds normal. No stridor. No respiratory distress. Kristin [name] has no wheezes. Kristin [name] has no rales. Kristin [name] exhibits no tenderness.  Abdominal: Soft. Bowel sounds are normal. Kristin [name] exhibits no distension. There is no tenderness. There is no rebound and no guarding.  Musculoskeletal: Normal range of motion. Kristin [name] exhibits no edema and no tenderness.  Neurological: Kristin [name] is alert and oriented to person, place,  and time. Coordination normal.  Skin: Skin is warm and dry. No rash noted. Kristin [name] is not diaphoretic. No erythema. No pallor.  Psychiatric: Kristin [name] is tearful today and seems to be anxious.. Judgment and thought content normal.    EKG: Normal sinus rhythm with moderate left ventricular hypertrophy. No significant ST or T wave changes  ASSESSMENT AND PLAN

## 2015-08-27 NOTE — Telephone Encounter (Signed)
S/w pt who reports chest pressure which has been occuring off and on since Christmas. Denies left arm pain, nausea, back pain, or diaphoresis.  She has rheumatoid arthritis and is unsure if pain is related to this or her heart. Hx of MI Denies pain at this time.States she notices it when she stresses out or "doing things I am not suppose to be doing"  When asked with which activities she was referring , she states it can occur when walking up and down stairs or raking leaves. Symptoms subside once she rests. She is agreeable to appt today at 2:30pm. States she just took a tramadol so will have her spouse drive. Advised pt to seek emergent care if she experiences increased symptoms prior to appt today. Pt verbalized understanding with no further questions.

## 2015-08-27 NOTE — Assessment & Plan Note (Signed)
The patient has reported history of coronary spasm. Most recent cardiac catheterization in 2013 showed only mild disease affecting the LAD and right coronary artery. She has been under significant stress lately and I think this has something to do with her recent  chest pain. Her blood pressure is also significantly elevated.  I elected to increase the dose of diltiazem to 180 mg once daily and also losartan to 50 mg once daily. I refilled her sublingual nitroglycerin.  I will reevaluate symptoms in one month and consider a stress test if she continues to have symptoms.

## 2015-08-27 NOTE — Patient Instructions (Addendum)
Medication Instructions:  Your physician has recommended you make the following change in your medication:  INCREASE losartan to 50mg  daily INCREASE diltiazem to 180mg  daily TAKE sublingual nitroglycerin as needed. You may take one every 5 minutes up to a maximum of 3 doses. If chest pain persists, please seek medical attention.    Labwork: none  Testing/Procedures: none  Follow-Up: Your physician recommends that you schedule a follow-up appointment in one month with Dr. Fletcher Anon.    Any Other Special Instructions Will Be Listed Below (If Applicable).     If you need a refill on your cardiac medications before your next appointment, please call your pharmacy.

## 2015-08-27 NOTE — Assessment & Plan Note (Signed)
These are well controlled on diltiazem.

## 2015-08-27 NOTE — Assessment & Plan Note (Signed)
Blood pressure is elevated. Both diltiazem and losartan were increased as outlined above.

## 2015-08-31 DIAGNOSIS — L853 Xerosis cutis: Secondary | ICD-10-CM | POA: Diagnosis not present

## 2015-09-13 ENCOUNTER — Other Ambulatory Visit: Payer: Self-pay | Admitting: *Deleted

## 2015-09-13 ENCOUNTER — Telehealth: Payer: Self-pay | Admitting: *Deleted

## 2015-09-13 MED ORDER — TRAMADOL HCL 50 MG PO TABS: 50.0000 mg | ORAL_TABLET | Freq: Three times a day (TID) | ORAL | Status: DC | PRN

## 2015-09-13 NOTE — Telephone Encounter (Signed)
Pt c/o BP issue: STAT if pt c/o blurred vision, one-sided weakness or slurred speech  1. What are your last 5 BP readings?   09/10/15: 184/75 HR 61  PM 190/69 HR 64 09/11/15: 169/70 HR 74   1/29:168/69 HR 69 09/13/15 182/75 HR 71   2. Are you having any other symptoms (ex. Dizziness, headache, blurred vision, passed out)? No   3. What is your BP issue? BP is high, states it could be anxiety, son is living with her and he is going through a bad divorce is just worried that she could have a stroke again. Please advise.

## 2015-09-13 NOTE — Telephone Encounter (Signed)
Patient left a voicemail request a refill on Tramadol Last refill 07/13/15 #90 Last office visit 02/23/15 Is it okay to refill?

## 2015-09-13 NOTE — Telephone Encounter (Signed)
S/w pt who reports elevated BP since Jan 27. Readings are listed below. States she checks her BP BID at her local drug store.  Today, the pharmacist suggested she contact MD.  At 1/13 OV, losartan increased to 50mg  daily and diltiazem 180mg  daily. She reports taking as directed with no missed doses. No new medications have been added. She is unsure if it is related to her arthritis pain or anxiety as her son is going through a bad divorce and is living with her.  Informed pt that pain and stress can cause elevated BP. She is aware and trying to work on this. Will forward to MD for review and further recommendations.

## 2015-09-13 NOTE — Telephone Encounter (Signed)
Tramadol called into [facility]. 

## 2015-09-14 NOTE — Telephone Encounter (Signed)
Increase losartan to 100 mg once daily. °

## 2015-09-15 ENCOUNTER — Other Ambulatory Visit: Payer: Self-pay

## 2015-09-15 MED ORDER — LOSARTAN POTASSIUM 50 MG PO TABS: 100.0000 mg | ORAL_TABLET | Freq: Every day | ORAL | Status: DC

## 2015-09-15 NOTE — Telephone Encounter (Signed)
S/w pt regarding losartan recommendations. Pt agreeable w/plan and will continue to monitor. Confirmed Feb 13 appt. Pt had no further questions.

## 2015-09-27 ENCOUNTER — Ambulatory Visit (INDEPENDENT_AMBULATORY_CARE_PROVIDER_SITE_OTHER): Payer: PPO | Admitting: Cardiovascular Disease

## 2015-09-27 ENCOUNTER — Encounter: Payer: Self-pay | Admitting: Cardiovascular Disease

## 2015-09-27 VITALS — BP 150/74 | HR 70 | Ht 66.0 in | Wt 144.2 lb

## 2015-09-27 DIAGNOSIS — I10 Essential (primary) hypertension: Secondary | ICD-10-CM | POA: Diagnosis not present

## 2015-09-27 DIAGNOSIS — E785 Hyperlipidemia, unspecified: Secondary | ICD-10-CM | POA: Diagnosis not present

## 2015-09-27 DIAGNOSIS — I25111 Atherosclerotic heart disease of native coronary artery with angina pectoris with documented spasm: Secondary | ICD-10-CM | POA: Diagnosis not present

## 2015-09-27 DIAGNOSIS — I159 Secondary hypertension, unspecified: Secondary | ICD-10-CM

## 2015-09-27 MED ORDER — LOSARTAN POTASSIUM-HCTZ 100-12.5 MG PO TABS: 1.0000 | ORAL_TABLET | Freq: Every day | ORAL | Status: DC

## 2015-09-27 NOTE — Patient Instructions (Signed)
Medication Instructions:  Your physician has recommended you make the following change in your medication:  STOP taking losartan START taking losartan/HCTZ 100/12.5mg  once daily   Labwork: BMET in one week  Testing/Procedures: none  Follow-Up: Your physician recommends that you schedule a follow-up appointment in: 3 months with Dr. Fletcher Anon.    Any Other Special Instructions Will Be Listed Below (If Applicable).     If you need a refill on your cardiac medications before your next appointment, please call your pharmacy.

## 2015-09-27 NOTE — Progress Notes (Signed)
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   She had cardiac catheterization in 05/2012 which showed no significant [name]. Only mild distal LAD and RCA disease She has known palpitations due to PVCs and ventricular bigeminy.  She was seen recently for elevated blood pressure in the setting of significant stress at home. I increased the dose of losartan to 50 mg once daily. She called [place]. Days later with continued high readings. I increased the dose to 100 mg once daily but for some reason she continued on the 50 mg once daily. She continues to check her blood pressure regularly at the local pharmacy. I reviewed the readings today and they were almost all above 0000000 systolic. Overall she feels better than before with less stress.  Allergies  Allergen Reactions  . Angiotensin Receptor Blockers   . Latex     REACTION: Rash, blisters, breathing probs.  . Penicillins     REACTION: Hives, swellling     Current Outpatient Prescriptions on File Prior to Visit  Medication Sig Dispense Refill  . acetaminophen (TYLENOL) 325 MG tablet Take 325 mg by mouth every 8 (eight) hours as needed (pain).     [name] aspirin EC 81 MG tablet Take 81 mg by mouth daily.    . Calcium Carbonate-Vitamin D (CALTRATE 600+D PO) Take 1 tablet by mouth 2 (two) times daily.     . Coenzyme Q10 200 MG capsule Take 200 mg by mouth daily.      [name] diltiazem (CARDIZEM CD) 180 MG 24 hr capsule Take 1 capsule (180 mg total) by mouth daily. 30 capsule 3  . Doxylamine Succinate, Sleep, (UNISOM) 25 MG tablet Take 12.5 mg by mouth at bedtime as needed for sleep.     [name] econazole nitrate 1 % cream Apply 1 application topically 2 (two) times daily.    [name] losartan (COZAAR) 50 MG tablet Take 2 tablets (100 mg  total) by mouth daily. (Patient taking differently: Take 50 mg by mouth daily. ) 30 tablet 3  . Magnesium 250 MG TABS Take 250 mg by mouth daily.     . Multiple Vitamin (MULTIVITAMIN) tablet Take 1 tablet by mouth daily.      [name] neomycin-polymyxin-dexameth (MAXITROL) 0.1 % OINT Place 1 application into both eyes at bedtime.    . nitroGLYCERIN (NITROSTAT) 0.4 MG SL tablet Place 1 tablet (0.4 mg total) under the tongue every 5 (five) minutes as needed for chest pain. 30 tablet 3  . Omega-3 Fatty Acids (FISH OIL) 1200 MG CAPS Take 1 capsule by mouth 3 (three) times daily.     [name] OVER THE COUNTER MEDICATION Take 1 capsule by mouth daily. Estroven herbal supplement    . Polyethyl Glycol-Propyl Glycol (SYSTANE) 0.4-0.3 % SOLN Apply 1 drop to eye daily as needed (dry eyes).     . Probiotic Product ([place]) CAPS Take 1 capsule by mouth daily.    . Tdap (BOOSTRIX) 5-2.5-18.5 LF-MCG/0.5 injection Inject 0.5 mLs into the muscle once. 0.5 mL 0  . traMADol (ULTRAM) 50 MG tablet Take 1 tablet (50 mg total) by mouth every 8 (eight) hours as needed. 90 tablet 0  . triamcinolone cream (KENALOG) 0.1 % Apply 1 application topically 2 (two) times daily.   0  . vitamin  B-12 (CYANOCOBALAMIN) 1000 MCG tablet Take 1,000 mcg by mouth daily.     No current facility-administered medications on file prior to visit.     Past Medical History  Diagnosis Date  . Rosacea   . Degeneration of lumbar or lumbosacral intervertebral disc   . Myalgia and myositis, unspecified   . Old myocardial infarction 1996  . Obstructive sleep apnea (adult) (pediatric)   . Osteoarthrosis, unspecified whether generalized or localized, unspecified site   . Stroke [facility])     ischaemic microvascular disease  . Arthritis     recent falls -aug 2010  . [name] (coronary artery disease)     mi-1996  . 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HCC)     1991 , bleed and dizziness   . Unspecified essential hypertension     takes  Diltiazem,Metoprolol,and Losartan daily  . Complication of anesthesia     pt states takes a long time to wake her wake up  . Fibromyalgia   . Rheumatoid arthritis ([place])   . Cough     no fever   . Seasonal allergies   . Dizziness     r/t meds  . Joint pain   . Joint swelling   . Osteoarthritis   . [name] (irritable bowel syndrome)     takes OTC [place] Negus colon health  . UTI (lower urinary tract infection)     frequent=last one was winter of 2014  . History of shingles   . Psoriatic arthritis ([place])   . 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 Ethmoidectomy  1998  . Sinus biopsy  1998    inverted papiloma  . Ct abd wo/w & pelvis wo cm  2013    no acute process, + minimal diverticulosis and hepatic/renal cysts, ATH calcifications  . Oophorectomy      BSO  . Cardiac catheterization  1996    vasospams; no blockage  . Cardiac catheterization  2013    No obstructive coronary artery disease. Mild distal LAD and proximal RCA disease.  . Atherectomy  1996  . Cataract extraction Bilateral   . Nasal sinus surgery  1998  . Colonoscopy    . Lipoma excision N/A 08/19/2014    Procedure: EXCISION OF MULTIPLE FACIAL CANCER LESIONS AND SOME WITH FLAP CLOSURE ;  Surgeon: Izora Gala, MD;  Location: [facility] OR;  Service: ENT;  Laterality: N/A;  . Vaginal hysterectomy  1992    for mennorhagia-LAVH BSO     Family History  Problem Relation Age of Onset  . Colon cancer Father   . Heart attack Mother   . Coronary artery disease Mother   . Osteoporosis Mother   . Heart disease Mother   . Heart failure Mother   . GER disease Sister   . Hypertension Sister   . Osteopenia Sister   . GER disease Sister   . Breast cancer Paternal Aunt     age 37  . Heart failure Maternal Grandmother   . Heart failure Maternal Grandfather      Social History   Social History  .  Marital Status: Married    Spouse Name: N/A  . Number of Children: 2  . Years of Education: BSN   Occupational History  . 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     Social History Main Topics  . Smoking status: Never  Smoker   . Smokeless tobacco: Never Used  . Alcohol Use: No     Comment: Rare  . Drug Use: No  . Sexual Activity: Not Currently    Birth Control/ Protection: Surgical     Comment: 1st intercourse 83 yo-2 partners   Other Topics Concern  . Not on file   Social History Narrative   No regular exercise-used to do [place] will.. She is DNR, [name] is husband [name] , then son [name]       10/22/12-cardiac work up found the reason for her dizziness,  she needs no neurological follow up for that.  her enthusiasm for CPAP remains limited and is explained by her resiudal high AHI after she finally reached compliance ,[place] 13 points.     95% pressure is 11.7 cm , AHI  20 .7- she does not benefit from CPAP . She is allowed to d/c the machine and we need to concerntrate on her dizziness and facial right droop. ordered MRI brain repeat.    Eye exam next week. PT at [facility]. MRI brain in April- per patients request order next month.                PHYSICAL EXAM   BP 150/74 mmHg  Pulse 70  Ht 5\' 6"  (1.676 m)  Wt 144 lb 4 oz (65.431 kg)  BMI 23.29 kg/m2  LMP 08/14/1989 Constitutional: She is oriented to person, place, and time. She appears well-developed and well-nourished. No distress.  HENT: No nasal discharge.  Head: Normocephalic and atraumatic.  Eyes: Pupils are equal and round. Right eye exhibits no discharge. Left eye exhibits no discharge.  Neck: Normal range of motion. Neck supple. No JVD present. No thyromegaly present.  Cardiovascular: Normal rate, regular rhythm , normal heart sounds. Exam reveals no gallop and no friction rub. No murmur heard.  Pulmonary/Chest: Effort normal and breath sounds normal. No  stridor. No respiratory distress. She has no wheezes. She has no rales. She exhibits no tenderness.  Abdominal: Soft. Bowel sounds are normal. She exhibits no distension. There is no tenderness. There is no rebound and no guarding.  Musculoskeletal: Normal range of motion. She exhibits no edema and no tenderness.  Neurological: She is alert and oriented to person, place, and time. Coordination normal.  Skin: Skin is warm and dry. No rash noted. She is not diaphoretic. No erythema. No pallor.  Psychiatric: She is alert and oriented. Judgment and thought content normal.     ASSESSMENT AND PLAN

## 2015-09-27 NOTE — Assessment & Plan Note (Signed)
Lab Results  Component Value Date   CHOL 196 07/20/2015   HDL 61.60 07/20/2015   LDLCALC 101* 07/20/2015        TRIG 169.0* 07/20/2015   CHOLHDL 3 07/20/2015   Continue lifestyle changes.

## 2015-09-27 NOTE — Assessment & Plan Note (Signed)
She is overall doing well with no anginal symptoms. Continue medical therapy.

## 2015-09-27 NOTE — Assessment & Plan Note (Signed)
Blood pressure continues to be elevated. I increased losartan to 100 mg and added small dose hydrochlorothiazide 12.5 mg once daily. She does have trace bilateral edema. Continue to check blood pressure at home. Check basic metabolic profile in one week.

## 2015-10-04 ENCOUNTER — Other Ambulatory Visit: Payer: PPO

## 2015-10-04 ENCOUNTER — Other Ambulatory Visit: Payer: Self-pay | Admitting: Cardiovascular Disease

## 2015-10-04 DIAGNOSIS — I159 Secondary hypertension, unspecified: Secondary | ICD-10-CM | POA: Diagnosis not present

## 2015-10-05 LAB — BASIC METABOLIC PANEL
BUN / CREAT RATIO: 22 (ref 11–26)
BUN: 22 mg/dL (ref 8–27)
CO2: 23 mmol/L (ref 18–29)
CREATININE: 1 mg/dL (ref 0.57–1.00)
Calcium: 9.5 mg/dL (ref 8.7–10.3)
Chloride: 101 mmol/L (ref 96–106)
GFR calc Af Amer: 64 mL/min/{1.73_m2} (ref >59)
GFR calc non Af Amer: 55 mL/min/{1.73_m2} — ABNORMAL LOW (ref >59)
GLUCOSE: 92 mg/dL (ref 65–99)
Potassium: 3.8 mmol/L (ref 3.5–5.2)
Sodium: 140 mmol/L (ref 134–144)

## 2015-10-06 ENCOUNTER — Telehealth: Payer: Self-pay

## 2015-10-06 NOTE — Telephone Encounter (Signed)
Pt would like lab results. States her BP is still "haywire"  Today it was 181/67, and 193/64. (Right arm, and left arm)

## 2015-10-06 NOTE — Telephone Encounter (Signed)
Spoke with patient and she went to the local pharmacy to have her blood pressure checked on 2 separate occasions. The first time she went it was 159/70 in the right arm and 163/72 in the left arm. On the second visit it was 181/67 in left arm and 193/64 in right arm. She stated that her pulse has always been great and that she feels fine. Let her know that I would forward to Dr. Fletcher Anon because she wanted to know if he would want to adjust her medications. She also wanted her lab results but they are not in the computer yet so will continue to watch for those. She stated that the blood pressure machine she used was the one at the pharmacy and I instructed her to purchase one for use at home and to start keeping a log. I let her know we would follow up with her.

## 2015-10-08 ENCOUNTER — Other Ambulatory Visit: Payer: Self-pay

## 2015-10-08 MED ORDER — LOSARTAN POTASSIUM-HCTZ 100-25 MG PO TABS: 1.0000 | ORAL_TABLET | Freq: Every day | ORAL | Status: DC

## 2015-10-08 NOTE — Telephone Encounter (Signed)
Change losartan-hydrochlorothiazide to 100-25 mg once daily.

## 2015-10-08 NOTE — Telephone Encounter (Signed)
Left detailed message on pt home VM w/med changed. Requested CB to confirm receipt of message. New prescription submitted

## 2015-10-08 NOTE — Telephone Encounter (Signed)
S/w pt who regarding MD recommendations to change losartan/HCTZ.  Pt reports BP this morning: 145/60 right arm and 181/68 left arm. Advised pt to continue to monitor BP prior to meds and one hour after.  She will report readings to me on Monday if BP is not improved or if BP continues to be different in each arm. Pt verbalized understanding and is agreeable w/plan.

## 2015-10-19 ENCOUNTER — Encounter: Payer: Self-pay | Admitting: Podiatry

## 2015-10-19 ENCOUNTER — Ambulatory Visit (INDEPENDENT_AMBULATORY_CARE_PROVIDER_SITE_OTHER): Payer: PPO | Admitting: Podiatry

## 2015-10-19 DIAGNOSIS — Q828 Other specified congenital malformations of skin: Secondary | ICD-10-CM | POA: Diagnosis not present

## 2015-10-19 DIAGNOSIS — B351 Tinea unguium: Secondary | ICD-10-CM | POA: Diagnosis not present

## 2015-10-19 DIAGNOSIS — M79676 Pain in unspecified toe(s): Secondary | ICD-10-CM | POA: Diagnosis not present

## 2015-10-19 NOTE — Progress Notes (Signed)
Patient ID: Kristin [name], female   DOB: [birth year], 75 y.o.   MRN: [id number]  Subjective: 75 y.o. returns the office today for painful, elongated, thickened toenails which she cannot trim herself. Denies any redness or drainage around the nails. Also has painful callus on the ball of the left foot. She has purchased new shoes and this seems to help some. Denies any acute changes since last appointment and no new complaints today. Denies any systemic complaints such as fevers, chills, nausea, vomiting.   Objective: AAO 3, NAD DP/PT pulses palpable, CRT less than 3 seconds Protective sensation intact with Simms Weinstein monofilament Nails hypertrophic, dystrophic, elongated, brittle, discolored 10. There is tenderness overlying the nails 1-5 bilaterally. There is no surrounding erythema or drainage along the nail sites. Hyperkeratotic lesion left submetatarsal one. Upon debridement no underlying ulceration, drainage or other signs of infection. No open lesions or pre-ulcerative lesions are identified. No other areas of tenderness bilateral lower extremities. No overlying edema, erythema, increased warmth. No pain with calf compression, swelling, warmth, erythema.  Assessment: Patient presents with symptomatic onychomycosis; hyperkeratotic lesion  Plan: -Treatment options including alternatives, risks, complications were discussed -Nails sharply debrided 10 without complication/bleeding. -Hyperkeratotic lesion debrided 1 without complications or bleeding. -Discussed daily foot inspection. If there are any changes, to call the office immediately.  -Follow-up in 3 months or sooner if any problems are to arise. In the meantime, encouraged to call the office with any questions, concerns, changes symptoms.  Celesta Gentile, DPM

## 2015-11-08 ENCOUNTER — Telehealth: Payer: Self-pay | Admitting: Cardiovascular Disease

## 2015-11-08 NOTE — Telephone Encounter (Signed)
Patient dropped off envelope with bp readings to be given to nurse.  Placed in nurse box.

## 2015-11-08 NOTE — Telephone Encounter (Signed)
Readings placed on Christell Faith, PA's desk, as Dr. Fletcher Anon is out of the office this week.

## 2015-11-09 ENCOUNTER — Other Ambulatory Visit: Payer: Self-pay

## 2015-11-09 MED ORDER — HYDRALAZINE HCL 25 MG PO TABS: 25.0000 mg | ORAL_TABLET | Freq: Three times a day (TID) | ORAL | Status: DC

## 2015-11-09 NOTE — Telephone Encounter (Signed)
Patient brought in BP log from 2/14-3/24 which indicate almost all readings Q000111Q systolic. A low systolic of Q000111Q occuring twice. Peak systolic BP of 0000000. Bilateral BP have been consistent with reading of 166/62 (right) and 162/68 (left) on 3/24. Please have patient start hydralazine 25 mg tid. Continue current medications. Continue BP log.

## 2015-11-09 NOTE — Telephone Encounter (Signed)
S/w pt regarding Ryan's recommendations. Pt verbalized understanding and is agreeable w/plan. Hydralazine prescription submitted to [place]

## 2015-12-01 ENCOUNTER — Other Ambulatory Visit: Payer: Self-pay | Admitting: Cardiovascular Disease

## 2015-12-01 DIAGNOSIS — L308 Other specified dermatitis: Secondary | ICD-10-CM | POA: Diagnosis not present

## 2015-12-07 ENCOUNTER — Telehealth: Payer: Self-pay | Admitting: Cardiovascular Disease

## 2015-12-07 NOTE — Telephone Encounter (Signed)
Increase diltiazem extended release to 240 mg once daily. Schedule renal artery duplex in follow-up after that.

## 2015-12-07 NOTE — Telephone Encounter (Signed)
Pt brought by BP log which continues to show elevated BP. Losartan was changed to losartan/HCTZ (100-12.5) at Feb 13 OV. It was then increased to 100-25mg  Feb 24 and hydralazine 25mg  TID was added on 3/28.  Reviewed medications w/pt who confirms she has not missed any doses. She follows a low sodium diet. SBPs continue to be 150s-170s. Will forward to Dr. Fletcher Anon to review and advise.

## 2015-12-08 ENCOUNTER — Encounter: Payer: Self-pay | Admitting: Cardiovascular Disease

## 2015-12-08 ENCOUNTER — Other Ambulatory Visit: Payer: Self-pay

## 2015-12-08 DIAGNOSIS — I10 Essential (primary) hypertension: Secondary | ICD-10-CM

## 2015-12-08 MED ORDER — DILTIAZEM HCL ER COATED BEADS 240 MG PO CP24: 240.0000 mg | ORAL_CAPSULE | Freq: Every day | ORAL | Status: DC

## 2015-12-08 NOTE — Patient Instructions (Signed)
Your physician has requested that you have a renal artery duplex. During this test, an ultrasound is used to evaluate blood flow to the kidneys. Allow one hour for this exam. Do not eat after midnight the day before and avoid carbonated beverages. Take your medications as you usually do. 

## 2015-12-08 NOTE — Telephone Encounter (Signed)
S/w pt to review med change, test date and f/u appt. She understands NPO at midnight prior to renal artery duplex and to avoid carbonated beverages 24 hours before.  Pt verbalized understanding and is agreeable w/plan. She had no further questions.

## 2015-12-14 ENCOUNTER — Ambulatory Visit: Payer: PPO

## 2015-12-14 DIAGNOSIS — I10 Essential (primary) hypertension: Secondary | ICD-10-CM

## 2015-12-20 DIAGNOSIS — Z961 Presence of intraocular lens: Secondary | ICD-10-CM | POA: Diagnosis not present

## 2015-12-27 ENCOUNTER — Ambulatory Visit (INDEPENDENT_AMBULATORY_CARE_PROVIDER_SITE_OTHER): Payer: PPO | Admitting: Cardiovascular Disease

## 2015-12-27 ENCOUNTER — Encounter: Payer: Self-pay | Admitting: Cardiovascular Disease

## 2015-12-27 VITALS — BP 140/60 | HR 77 | Ht 66.0 in | Wt 144.8 lb

## 2015-12-27 DIAGNOSIS — I493 Ventricular premature depolarization: Secondary | ICD-10-CM | POA: Diagnosis not present

## 2015-12-27 DIAGNOSIS — I10 Essential (primary) hypertension: Secondary | ICD-10-CM | POA: Diagnosis not present

## 2015-12-27 DIAGNOSIS — I25111 Atherosclerotic heart disease of native coronary artery with angina pectoris with documented spasm: Secondary | ICD-10-CM

## 2015-12-27 NOTE — Progress Notes (Signed)
Cardiology Office Note   Date:  12/27/2015   ID:  Odile, Binda [birth year], MRN [id number]  PCP:  Eliezer Lofts, MD  Cardiologist:   Kathlyn Sacramento, MD   Chief Complaint  Patient presents with  . other    3 month f/u discuss flonase and statin drugs. Meds reviewed verbally.      History of Present Illness: Kristin [name] is a 75 y.o. female who presents for a follow-up visit regarding hypertension and frequent PVCs. 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   She had cardiac catheterization in 05/2012 which showed no significant [name]. Only mild distal LAD and RCA disease She has known palpitations due to PVCs and ventricular bigeminy.  She has been followed lately with increased blood pressure with gradual up titration of her medications. Most recently, diltiazem was increased to 240 mg once daily. She underwent renal artery duplex which showed no evidence of renal artery stenosis. Blood pressure gradually improved over the last few weeks. She is doing reasonably well and denies any chest pain, shortness of breath or palpitations.  Past Medical History  Diagnosis Date  . Rosacea   . Degeneration of lumbar or lumbosacral intervertebral disc   . Myalgia and myositis, unspecified   . Old myocardial infarction 1996  . Obstructive sleep apnea (adult) (pediatric)   . Osteoarthrosis, unspecified whether generalized or localized, unspecified site   . Stroke [facility])     ischaemic microvascular disease  . Arthritis     recent falls -aug 2010  . [name] (coronary artery disease)     mi-1996  . 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HCC)     1991 , bleed and dizziness   . Unspecified essential hypertension     takes Diltiazem,Metoprolol,and Losartan daily  .  Complication of anesthesia     pt states takes a long time to wake her wake up  . Fibromyalgia   . Rheumatoid arthritis ([place])   . Cough     no fever   . Seasonal allergies   . Dizziness     r/t meds  . Joint pain   . Joint swelling   . Osteoarthritis   . [name] (irritable bowel syndrome)     takes OTC Hard[place] Negus colon health  . UTI (lower urinary tract infection)     frequent=last one was winter of 2014  . History of shingles   . Psoriatic arthritis ([place])   . 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 Ethmoidectomy  1998  . Sinus biopsy  1998    inverted papiloma  . Ct abd wo/w & pelvis wo cm  2013    no acute process, + minimal diverticulosis and hepatic/renal cysts, ATH calcifications  . Oophorectomy      BSO  . Cardiac catheterization  1996    vasospams; no blockage  . Cardiac catheterization  2013    No obstructive coronary artery disease. Mild distal LAD and proximal RCA disease.  . Atherectomy  1996  . Cataract extraction Bilateral   . Nasal sinus surgery  1998  . Colonoscopy    . Lipoma excision N/A 08/19/2014    Procedure: EXCISION OF MULTIPLE FACIAL CANCER LESIONS AND SOME WITH FLAP  CLOSURE ;  Surgeon: Izora Gala, MD;  Location: [place];  Service: ENT;  Laterality: N/A;  . Vaginal hysterectomy  1992    for mennorhagia-LAVH BSO     Current Outpatient Prescriptions  Medication Sig Dispense Refill  . acetaminophen (TYLENOL) 325 MG tablet Take 325 mg by mouth every 8 (eight) hours as needed (pain).     [name] aspirin EC 81 MG tablet Take 81 mg by mouth daily. Reported on 10/19/2015    . Calcium Carbonate-Vitamin D (CALTRATE 600+D PO) Take 1 tablet by mouth 2 (two) times daily.     . Coenzyme Q10 200 MG capsule Take 200 mg by mouth daily.      [name] diltiazem (CARDIZEM CD) 240 MG 24 hr capsule Take 1 capsule (240 mg total) by mouth daily. 30 capsule 3  .  Doxylamine Succinate, Sleep, (UNISOM) 25 MG tablet Take 12.5 mg by mouth at bedtime as needed for sleep.     [name] econazole nitrate 1 % cream Apply 1 application topically 2 (two) times daily.    . hydrALAZINE (APRESOLINE) 25 MG tablet Take 1 tablet (25 mg total) by mouth 3 (three) times daily. 90 tablet 3  . losartan-hydrochlorothiazide (HYZAAR) 100-25 MG tablet Take 1 tablet by mouth daily. 30 tablet 3  . Magnesium 250 MG TABS Take 250 mg by mouth daily.     . Multiple Vitamin (MULTIVITAMIN) tablet Take 1 tablet by mouth daily.      [name] neomycin-polymyxin-dexameth (MAXITROL) 0.1 % OINT Place 1 application into both eyes at bedtime.    . nitroGLYCERIN (NITROSTAT) 0.4 MG SL tablet Place 1 tablet (0.4 mg total) under the tongue every 5 (five) minutes as needed for chest pain. 30 tablet 3  . Omega-3 Fatty Acids (FISH OIL) 1200 MG CAPS Take 1 capsule by mouth 2 (two) times daily.     [name] OVER THE COUNTER MEDICATION Take 1 capsule by mouth daily. Estroven herbal supplement    . Polyethyl Glycol-Propyl Glycol (SYSTANE) 0.4-0.3 % SOLN Apply 1 drop to eye daily as needed (dry eyes).     . Probiotic Product ([place]) CAPS Take 1 capsule by mouth daily.    . Tdap (BOOSTRIX) 5-2.5-18.5 LF-MCG/0.5 injection Inject 0.5 mLs into the muscle once. 0.5 mL 0  . traMADol (ULTRAM) 50 MG tablet Take 1 tablet (50 mg total) by mouth every 8 (eight) hours as needed. 90 tablet 0  . triamcinolone cream (KENALOG) 0.1 % Apply 1 application topically 2 (two) times daily.   0  . vitamin B-12 (CYANOCOBALAMIN) 1000 MCG tablet Take 1,000 mcg by mouth daily.     No current facility-administered medications for this visit.    Allergies:   Angiotensin receptor blockers; Latex; and Penicillins    Social History:  The patient  reports that she has never smoked. She has never used smokeless tobacco. She reports that she does not drink alcohol or use illicit drugs.   Family History:  The patient's family history includes  Breast cancer in her paternal aunt; Colon cancer in her father; Coronary artery disease in her mother; GER disease in her sister and sister; Heart attack in her mother; Heart disease in her mother; Heart failure in her maternal grandfather, maternal grandmother, and mother; Hypertension in her sister; Osteopenia in her sister; Osteoporosis in her mother.    ROS:  Please see the history of present illness.   Otherwise, review of systems are positive for none.   All other systems are reviewed and negative.  PHYSICAL EXAM: VS:  BP 140/60 mmHg  Pulse 77  Ht 5\' 6"  (1.676 m)  Wt 144 lb 12 oz (65.658 kg)  BMI 23.37 kg/m2  LMP 08/14/1989 , BMI Body mass index is 23.37 kg/(m^2). GEN: Well nourished, well developed, in no acute distress HEENT: normal Neck: no JVD, carotid bruits, or masses Cardiac: RRR; no murmurs, rubs, or gallops,no edema  Respiratory:  clear to auscultation bilaterally, normal work of breathing GI: soft, nontender, nondistended, + BS MS: no deformity or atrophy Skin: warm and dry, no rash Neuro:  Strength and sensation are intact Psych: euthymic mood, full affect   EKG:  EKG is not ordered today.    Recent Labs: 01/06/2015: ALT 19 10/04/2015: BUN 22; Creatinine, Ser 1.00; Potassium 3.8; Sodium 140    Lipid Panel    Component Value Date/Time   CHOL 196 07/20/2015 0909   TRIG 169.0* 07/20/2015 0909   HDL 61.60 07/20/2015 0909   CHOLHDL 3 07/20/2015 0909   VLDL 33.8 07/20/2015 0909   LDLCALC 101* 07/20/2015 0909   LDLDIRECT 158.1 10/02/2013 0914      Wt Readings from Last 3 Encounters:  12/27/15 144 lb 12 oz (65.658 kg)  09/27/15 144 lb 4 oz (65.431 kg)  08/27/15 146 lb 4 oz (66.339 kg)        ASSESSMENT AND PLAN:  1.  Essential hypertension:Blood pressure improved after increasing diltiazem. Renal artery duplex showed no evidence of renal artery stenosis. I recommend continuing same medications for now. If blood pressure starts going up again, I  recommend checking aldosterone/Renin ratio . We can consider switching hydralazine to spironolactone  2. Coronary artery disease involving native coronary arteries with previous spasm: No anginal symptoms. Continue medical therapy.  3. 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  4. Hyperlipidemia: Most recent lipid profile showed an LDL of 101. No indication for treatment with a statin given that she has no significant atherosclerosis.  10 year risk of atherosclerotic cardiovascular disease is calculated to be 11.1%. However, most of this is driven by age.   Disposition:   FU with me in 6 months  Signed,  Kathlyn Sacramento, MD  12/27/2015 10:14 AM    [place]

## 2015-12-27 NOTE — Patient Instructions (Signed)
Medication Instructions: Continue same medications.   Labwork: None.   Procedures/Testing: None.   Follow-Up: 6 months with Dr. Arida.   Any Additional Special Instructions Will Be Listed Below (If Applicable).     If you need a refill on your cardiac medications before your next appointment, please call your pharmacy.   

## 2016-01-20 ENCOUNTER — Encounter: Payer: Self-pay | Admitting: Podiatry

## 2016-01-20 ENCOUNTER — Ambulatory Visit (INDEPENDENT_AMBULATORY_CARE_PROVIDER_SITE_OTHER): Payer: PPO | Admitting: Podiatry

## 2016-01-20 DIAGNOSIS — M79676 Pain in unspecified toe(s): Secondary | ICD-10-CM

## 2016-01-20 DIAGNOSIS — B351 Tinea unguium: Secondary | ICD-10-CM

## 2016-01-20 DIAGNOSIS — Q828 Other specified congenital malformations of skin: Secondary | ICD-10-CM

## 2016-01-20 NOTE — Progress Notes (Signed)
Patient ID: Kristin [name], female   DOB: [birth year], 75 y.o.   MRN: [id number]  Subjective: 75 y.o. returns the office today for painful, elongated, thickened toenails which she cannot trim herself. Denies any redness or drainage around the nails. Also has painful callus on the ball of the left foot. She has purchased new shoes and this seems to help some. Denies any acute changes since last appointment and no new complaints today. Denies any systemic complaints such as fevers, chills, nausea, vomiting.   Objective: AAO 3, NAD DP/PT pulses palpable, CRT less than 3 seconds Protective sensation intact with Simms Weinstein monofilament Nails hypertrophic, dystrophic, elongated, brittle, discolored 10. There is tenderness overlying the nails 1-5 bilaterally. There is no surrounding erythema or drainage along the nail sites. Hyperkeratotic lesion left submetatarsal one. Upon debridement no underlying ulceration, drainage or other signs of infection. No open lesions or pre-ulcerative lesions are identified. No other areas of tenderness bilateral lower extremities. No overlying edema, erythema, increased warmth. No pain with calf compression, swelling, warmth, erythema.  Assessment: Patient presents with symptomatic onychomycosis; hyperkeratotic lesion  Plan: -Treatment options including alternatives, risks, complications were discussed -Nails sharply debrided 10 without complication/bleeding. -Hyperkeratotic lesion debrided 1 without complications or bleeding. -Discussed daily foot inspection. If there are any changes, to call the office immediately.  -Follow-up in 3 months or sooner if any problems are to arise. In the meantime, encouraged to call the office with any questions, concerns, changes symptoms.  Celesta Gentile, DPM

## 2016-01-27 ENCOUNTER — Telehealth: Payer: Self-pay | Admitting: Family Medicine

## 2016-01-27 ENCOUNTER — Other Ambulatory Visit (INDEPENDENT_AMBULATORY_CARE_PROVIDER_SITE_OTHER): Payer: PPO

## 2016-01-27 DIAGNOSIS — E785 Hyperlipidemia, unspecified: Secondary | ICD-10-CM | POA: Diagnosis not present

## 2016-01-27 LAB — LIPID PANEL
CHOL/HDL RATIO: 3
Cholesterol: 224 mg/dL — ABNORMAL HIGH (ref 0–200)
HDL: 80.5 mg/dL (ref >39.00)
LDL Cholesterol: 123 mg/dL — ABNORMAL HIGH (ref 0–99)
NONHDL: 143.21
Triglycerides: 102 mg/dL (ref 0.0–149.0)
VLDL: 20.4 mg/dL (ref 0.0–40.0)

## 2016-01-27 LAB — COMPREHENSIVE METABOLIC PANEL
ALT: 16 U/L (ref 0–35)
AST: 21 U/L (ref 0–37)
Albumin: 4.4 g/dL (ref 3.5–5.2)
Alkaline Phosphatase: 48 U/L (ref 39–117)
BILIRUBIN TOTAL: 0.5 mg/dL (ref 0.2–1.2)
BUN: 26 mg/dL — ABNORMAL HIGH (ref 6–23)
CALCIUM: 10.2 mg/dL (ref 8.4–10.5)
CO2: 31 meq/L (ref 19–32)
CREATININE: 1 mg/dL (ref 0.40–1.20)
Chloride: 102 mEq/L (ref 96–112)
GFR: 57.39 mL/min — AB (ref >60.00)
GLUCOSE: 91 mg/dL (ref 70–99)
Potassium: 3.4 mEq/L — ABNORMAL LOW (ref 3.5–5.1)
SODIUM: 139 meq/L (ref 135–145)
TOTAL PROTEIN: 7.7 g/dL (ref 6.0–8.3)

## 2016-01-27 NOTE — Telephone Encounter (Signed)
-----   Message from Marchia Bond sent at 01/27/2016  8:55 AM EDT ----- Regarding: Pt walked in this am for cpx labs, need orders. Thanks! :-) Please order  future cpx labs for pt's upcoming lab appt. Thanks Aniceto Boss

## 2016-01-28 ENCOUNTER — Ambulatory Visit (INDEPENDENT_AMBULATORY_CARE_PROVIDER_SITE_OTHER): Payer: PPO

## 2016-01-28 ENCOUNTER — Other Ambulatory Visit: Payer: PPO

## 2016-01-28 VITALS — BP 144/70 | HR 69 | Temp 98.0°F | Ht 63.5 in | Wt 144.5 lb

## 2016-01-28 DIAGNOSIS — Z0000 Encounter for general adult medical examination without abnormal findings: Secondary | ICD-10-CM | POA: Diagnosis not present

## 2016-01-28 NOTE — Progress Notes (Signed)
Subjective:   Kristin [name] is a 75 y.o. female who presents for Medicare Annual (Subsequent) preventive examination.  Review of Systems:  N/A Cardiac Risk Factors include: advanced age (>72men, >74 women);dyslipidemia;hypertension     Objective:     Vitals: BP 144/70 mmHg  Pulse 69  Temp(Src) 98 F (36.7 C) (Oral)  Ht 5' 3.5" (1.613 m)  Wt 144 lb 8 oz (65.545 kg)  BMI 25.19 kg/m2  SpO2 99%  LMP 08/14/1989  Body mass index is 25.19 kg/(m^2).   Tobacco History  Smoking status  . Never Smoker   Smokeless tobacco  . Never Used     Counseling given: No   Past Medical History  Diagnosis Date  . Rosacea   . Degeneration of lumbar or lumbosacral intervertebral disc   . Myalgia and myositis, unspecified   . Old myocardial infarction 1996  . Obstructive sleep apnea (adult) (pediatric)   . Osteoarthrosis, unspecified whether generalized or localized, unspecified site   . Stroke [facility])     ischaemic microvascular disease  . Arthritis     recent falls -aug 2010  . [name] (coronary artery disease)     mi-1996  . 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HCC)     1991 , bleed and dizziness   . Unspecified essential hypertension     takes Diltiazem,Metoprolol,and Losartan daily  . Complication of anesthesia     pt states takes a long time to wake her wake up  . Fibromyalgia   . Rheumatoid arthritis ([place])   . Cough     no fever   . Seasonal allergies   . Dizziness     r/t meds  . Joint pain   . Joint swelling   . Osteoarthritis   . [name] (irritable bowel syndrome)     takes OTC [place] Negus colon health  . UTI (lower urinary tract infection)     frequent=last one was winter of 2014  . History of shingles   . Psoriatic arthritis ([place])   . 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 ([place]) 08/27/2015   Past Surgical History  Procedure Laterality Date  . Ethmoidectomy  1998  . Sinus biopsy  1998   inverted papiloma  . Ct abd wo/w & pelvis wo cm  2013    no acute process, + minimal diverticulosis and hepatic/renal cysts, ATH calcifications  . Oophorectomy      BSO  . Cardiac catheterization  1996    vasospams; no blockage  . Cardiac catheterization  2013    No obstructive coronary artery disease. Mild distal LAD and proximal RCA disease.  . Atherectomy  1996  . Cataract extraction Bilateral   . Nasal sinus surgery  1998  . Colonoscopy    . 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 Vaginal hysterectomy  1992    for mennorhagia-LAVH BSO   Family History  Problem Relation Age of Onset  . Colon cancer Father   . Heart attack Mother   . Coronary artery disease Mother   . Osteoporosis Mother   . Heart disease Mother   . Heart failure Mother   . GER disease Sister   . Hypertension Sister   . Osteopenia Sister   . GER disease Sister   . Breast cancer Paternal Aunt  age 44  . Heart failure Maternal Grandmother   . Heart failure Maternal Grandfather    History  Sexual Activity  . Sexual Activity: Not Currently  . Birth Control/ Protection: Surgical    Comment: 1st intercourse 67 yo-2 partners    Outpatient Encounter Prescriptions as of 01/28/2016  Medication Sig  . acetaminophen (TYLENOL) 325 MG tablet Take 325 mg by mouth every 8 (eight) hours as needed (pain).   [name] aspirin EC 81 MG tablet Take 81 mg by mouth daily. Reported on 10/19/2015  . Calcium Carbonate-Vitamin D (CALTRATE 600+D PO) Take 1 tablet by mouth 2 (two) times daily.   . Coenzyme Q10 200 MG capsule Take 200 mg by mouth daily.    [name] diltiazem (CARDIZEM CD) 240 MG 24 hr capsule Take 1 capsule (240 mg total) by mouth daily.  . Doxylamine Succinate, Sleep, (UNISOM) 25 MG tablet Take 12.5 mg by mouth at bedtime as needed for sleep.   [name] econazole nitrate 1 %  cream Apply 1 application topically 2 (two) times daily.  . hydrALAZINE (APRESOLINE) 25 MG tablet Take 1 tablet (25 mg total) by mouth 3 (three) times daily.  [name] losartan-hydrochlorothiazide (HYZAAR) 100-25 MG tablet Take 1 tablet by mouth daily.  . Magnesium 250 MG TABS Take 250 mg by mouth daily.   . Multiple Vitamin (MULTIVITAMIN) tablet Take 1 tablet by mouth daily.    [name] neomycin-polymyxin-dexameth (MAXITROL) 0.1 % OINT Place 1 application into both eyes at bedtime.  . nitroGLYCERIN (NITROSTAT) 0.4 MG SL tablet Place 1 tablet (0.4 mg total) under the tongue every 5 (five) minutes as needed for chest pain.  . Omega-3 Fatty Acids (FISH OIL) 1200 MG CAPS Take 1 capsule by mouth 2 (two) times daily.   [name] OVER THE COUNTER MEDICATION Take 1 capsule by mouth daily. Estroven herbal supplement  . Polyethyl Glycol-Propyl Glycol (SYSTANE) 0.4-0.3 % SOLN Apply 1 drop to eye daily as needed (dry eyes).   . Probiotic Product ([place]) CAPS Take 1 capsule by mouth daily.  . traMADol (ULTRAM) 50 MG tablet Take 1 tablet (50 mg total) by mouth every 8 (eight) hours as needed.  . triamcinolone cream (KENALOG) 0.1 % Apply 1 application topically 2 (two) times daily as needed.   . vitamin B-12 (CYANOCOBALAMIN) 1000 MCG tablet Take 1,000 mcg by mouth daily.  . [DISCONTINUED] Tdap (BOOSTRIX) 5-2.5-18.5 LF-MCG/0.5 injection Inject 0.5 mLs into the muscle once.   No facility-administered encounter medications on file as of 01/28/2016.    Activities of Daily Living In your present state of health, do you have any difficulty performing the following activities: 01/28/2016  Hearing? Y  Vision? N  Difficulty concentrating or making decisions? Y  Walking or climbing stairs? N  Dressing or bathing? N  Doing errands, shopping? N  Preparing Food and eating ? N  Using the Toilet? N  In the past six months, have you accidently leaked urine? N  Do you have problems with loss of bowel control? N  Managing  your Medications? N  Managing your Finances? N  Housekeeping or managing your Housekeeping? N    Patient Care Team: Jinny Sanders, MD as PCP - General Wellington Hampshire, MD as Consulting Physician (Cardiology) Anastasio Auerbach, MD as Consulting Physician (Gynecology) Trula Slade, DPM as Consulting Physician (Podiatry) Birder Robson, MD as Referring Physician (Ophthalmology) Oneta Rack, MD as Consulting Physician (Dermatology) Valinda Party, MD as Consulting Physician (Rheumatology)    Assessment:  Hearing Screening   125Hz  250Hz  500Hz  1000Hz  2000Hz  4000Hz  8000Hz   Right ear:   0 0 40 0   Left ear:   0 40 40 0   Vision Screening Comments: Last eye exam in March 2017 with Dr. Merla Riches   Exercise Activities and Dietary recommendations Current Exercise Habits: Home exercise routine, Type of exercise: walking;stretching;Other - see comments (gardening), Time (Minutes): 30, Frequency (Times/Week): 7, Weekly Exercise (Minutes/Week): 210, Intensity: Mild, Exercise limited by: neurologic condition(s);orthopedic condition(s)  Goals    . Increase physical activity     Starting 01/28/2016, I will continue to exercise for at least 30 min daily as tolerated.       Fall Risk Fall Risk  01/28/2016 01/14/2015 10/17/2013 08/13/2012  Falls in the past year? No No Yes Yes  Number falls in past yr: - - 1 2 or more  Injury with Fall? - - Yes -  Risk Factor Category  - - - High Fall Risk  Risk for fall due to : - - History of fall(s) Other (Comment)  Risk for fall due to (comments): - - - thinks BP changes   Depression Screen PHQ 2/9 Scores 01/28/2016 01/14/2015 10/17/2013 08/13/2012  PHQ - 2 Score 0 0 0 0     Cognitive Testing MMSE - Mini Mental State Exam 01/28/2016  Orientation to time 5  Orientation to Place 5  Registration 3  Attention/ Calculation 0  Recall 0  Recall-comments pt was unable to recall 3 of 3 words  Language- name 2 objects 0  Language- repeat 1  Language-  follow 3 step command 3  Language- read & follow direction 0  Write a sentence 0  Copy design 0  Total score 17   PLEASE NOTE: A Mini-Cog screen was completed. Maximum score is 20. A value of 0 denotes this part of [place] MMSE was not completed or the patient failed this part of the Mini-Cog screening.   Mini-Cog Screening Orientation to Time - Max 5 pts Orientation to Place - Max 5 pts Registration - Max 3 pts Recall - Max 3 pts Language Repeat - Max 1 pts Language Follow 3 Step Command - Max 3 pts  Immunization History  Administered Date(s) Administered  . Influenza Split 05/16/2012  . Influenza Whole 05/15/2007, 05/20/2008, 05/14/2009, 05/14/2010  . Influenza-Unspecified 05/14/2013, 06/06/2014  . Pneumococcal Conjugate-13 10/17/2013  . Pneumococcal Polysaccharide-23 08/14/2005  . Td 08/14/2002  . Zoster 08/14/2005, 05/18/2008   Screening Tests Health Maintenance  Topic Date Due  . PNA vac Low Risk Adult (2 of 2 - PPSV23) 02/11/2016 (Originally 10/18/2014)  . PAP SMEAR  01/28/2044 (Originally 02/12/2012)  . INFLUENZA VACCINE  03/14/2016  . MAMMOGRAM  04/29/2016  . COLONOSCOPY  04/06/2020  . TETANUS/TDAP  05/28/2025  . DEXA SCAN  Completed  . ZOSTAVAX  Completed      Plan:     I have personally reviewed and addressed the Medicare Annual Wellness questionnaire and have noted the following in the patient's chart:  A. Medical and social history B. Use of alcohol, tobacco or illicit drugs  C. Current medications and supplements D. Functional ability and status E.  Nutritional status F.  Physical activity G. Advance directives H. List of other physicians I.  Hospitalizations, surgeries, and ER visits in previous 12 months J.  [place] to include hearing, vision, cognitive, depression L. Referrals and appointments - none  In addition, I have reviewed and discussed with patient certain preventive protocols, quality metrics, and best  practice recommendations.  A written personalized care plan for preventive services as well as general preventive health recommendations were provided to patient.  See attached scanned questionnaire for additional information.   Signed,   Lindell Noe, MHA, BS, LPN Health Advisor

## 2016-01-28 NOTE — Progress Notes (Signed)
I reviewed health advisor's note, was available for consultation, and agree with documentation and plan.  Amy Bedsole, MD [facility] [place]  

## 2016-01-28 NOTE — Patient Instructions (Signed)
Kristin [name] , Thank you for taking time to come for your Medicare Wellness Visit. I appreciate your ongoing commitment to your health goals. Please review the following plan we discussed and let me know if I can assist you in the future.   These are the goals we discussed: Goals    . Increase physical activity     Starting 01/28/2016, I will continue to exercise for at least 30 min daily as tolerated.        This is a list of the screening recommended for you and due dates:  Health Maintenance  Topic Date Due  . Pneumonia vaccines (2 of 2 - PPSV23) 02/11/2016*  . Pap Smear  01/28/2044*  . Flu Shot  03/14/2016  . Mammogram  04/29/2016  . Colon Cancer Screening  04/06/2020  . Tetanus Vaccine  05/28/2025  . DEXA scan (bone density measurement)  Completed  . Shingles Vaccine  Completed  *Topic was postponed. The date shown is not the original due date.   Preventive Care for Adults  A healthy lifestyle and preventive care can promote health and wellness. Preventive health guidelines for adults include the following key practices.  . A routine yearly physical is a good way to check with your health care provider about your health and preventive screening. It is a chance to share any concerns and updates on your health and to receive a thorough exam.  . Visit your dentist for a routine exam and preventive care every 6 months. Brush your teeth twice a day and floss once a day. Good oral hygiene prevents tooth decay and gum disease.  . The frequency of eye exams is based on your age, health, family medical history, use  of contact lenses, and other factors. Follow your health care provider's ecommendations for frequency of eye exams.  . Eat a healthy diet. Foods like vegetables, fruits, whole grains, low-fat dairy products, and lean protein foods contain the nutrients you need without too many calories. Decrease your intake of foods high in solid fats, added sugars, and salt. Eat the right  amount of calories for you. Get information about a proper diet from your health care provider, if necessary.  . Regular physical exercise is one of the most important things you can do for your health. Most adults should get at least 150 minutes of moderate-intensity exercise (any activity that increases your heart rate and causes you to sweat) each week. In addition, most adults need muscle-strengthening exercises on 2 or more days a week.  Silver Sneakers may be a benefit available to you. To determine eligibility, you may visit the website: www.silversneakers.com or contact program at 215-189-6705 Mon-Fri between 8AM-8PM.   . Maintain a healthy weight. The body mass index (BMI) is a screening tool to identify possible weight problems. It provides an estimate of body fat based on height and weight. Your health care provider can find your BMI and can help you achieve or maintain a healthy weight.   For adults 20 years and older: ? A BMI below 18.5 is considered underweight. ? A BMI of 18.5 to 24.9 is normal. ? A BMI of 25 to 29.9 is considered overweight. ? A BMI of 30 and above is considered obese.   . Maintain normal blood lipids and cholesterol levels by exercising and minimizing your intake of saturated fat. Eat a balanced diet with plenty of fruit and vegetables. Blood tests for lipids and cholesterol should begin at age 62 and be repeated every  5 years. If your lipid or cholesterol levels are high, you are over 50, or you are at high risk for heart disease, you may need your cholesterol levels checked more frequently. Ongoing high lipid and cholesterol levels should be treated with medicines if diet and exercise are not working.  . If you smoke, find out from your health care provider how to quit. If you do not use tobacco, please do not start.  . If you choose to drink alcohol, please do not consume more than 2 drinks per day. One drink is considered to be 12 ounces (355 mL) of beer, 5  ounces (148 mL) of wine, or 1.5 ounces (44 mL) of liquor.  . If you are 25-53 years old, ask your health care provider if you should take aspirin to prevent strokes.  . Use sunscreen. Apply sunscreen liberally and repeatedly throughout the day. You should seek shade when your shadow is shorter than you. Protect yourself by wearing long sleeves, pants, a wide-brimmed hat, and sunglasses year round, whenever you are outdoors.  . Once a month, do a whole body skin exam, using a mirror to look at the skin on your back. Tell your health care provider of new moles, moles that have irregular borders, moles that are larger than a pencil eraser, or moles that have changed in shape or color.

## 2016-01-28 NOTE — Progress Notes (Signed)
PCP notes:  Health maintenance:  Cervical cancer screening: Not indicated per pt based on Dr. Zelphia Cairo recommendation PPSV23 - postponed/pt wants to review immunization record before taking it; will advise PCP at CPE   Abnormal screenings:  Hearing - failed Cognitive - Mini-Cog score: 17/20  Patient concerns: None  Nurse concerns: None  Next PCP appt: 02/11/16 @ 1530

## 2016-01-28 NOTE — Progress Notes (Signed)
Pre visit review using our clinic review tool, if applicable. No additional management support is needed unless otherwise documented below in the visit note. 

## 2016-02-01 ENCOUNTER — Encounter: Payer: PPO | Admitting: Family Medicine

## 2016-02-02 ENCOUNTER — Other Ambulatory Visit: Payer: Self-pay | Admitting: Physician Assistant

## 2016-02-02 DIAGNOSIS — M797 Fibromyalgia: Secondary | ICD-10-CM | POA: Diagnosis not present

## 2016-02-02 DIAGNOSIS — M545 Low back pain: Secondary | ICD-10-CM | POA: Diagnosis not present

## 2016-02-02 DIAGNOSIS — M5440 Lumbago with sciatica, unspecified side: Secondary | ICD-10-CM | POA: Diagnosis not present

## 2016-02-02 DIAGNOSIS — M069 Rheumatoid arthritis, unspecified: Secondary | ICD-10-CM | POA: Diagnosis not present

## 2016-02-02 DIAGNOSIS — M25551 Pain in right hip: Secondary | ICD-10-CM | POA: Diagnosis not present

## 2016-02-02 DIAGNOSIS — M25552 Pain in left hip: Secondary | ICD-10-CM | POA: Diagnosis not present

## 2016-02-03 ENCOUNTER — Other Ambulatory Visit: Payer: Self-pay | Admitting: *Deleted

## 2016-02-03 MED ORDER — LOSARTAN POTASSIUM-HCTZ 100-25 MG PO TABS: 1.0000 | ORAL_TABLET | Freq: Every day | ORAL | Status: DC

## 2016-02-04 ENCOUNTER — Telehealth: Payer: Self-pay | Admitting: Cardiovascular Disease

## 2016-02-04 NOTE — Telephone Encounter (Signed)
S/w pt. She brought by BP readings yesterday.  BP elevated almost daily since June 5. She takes losartan/HCTZ, hydralazine and cardizem with no missed doses. No new medications added.  Will ask Christell Faith, PA-C to review

## 2016-02-07 NOTE — Telephone Encounter (Signed)
Patient has not received a call back on what to do about bp .  Patient also wants to know if she should go get refill for losartan because she has been out for 4 days.  This was sent to pharmacy by our office on 22nd but patient was not aware it was refilled.  Please call to discuss.

## 2016-02-08 ENCOUNTER — Other Ambulatory Visit: Payer: Self-pay

## 2016-02-08 DIAGNOSIS — I10 Essential (primary) hypertension: Secondary | ICD-10-CM

## 2016-02-08 MED ORDER — HYDRALAZINE HCL 50 MG PO TABS: 50.0000 mg | ORAL_TABLET | Freq: Three times a day (TID) | ORAL | Status: DC

## 2016-02-08 NOTE — Telephone Encounter (Signed)
Notified pt that losartan refill was sent June 22. She was unaware and will have husband pick it up. Advised pt awaiting review of BP readings by Christell Faith, PA-C and further advice.  She verbalized understanding and will await call back.

## 2016-02-08 NOTE — Telephone Encounter (Signed)
BP readings show persistently elevated readings with a max systolic BP of 123456 (time/date not given). Mostly BP has ran in the 123456 to 99991111 systolic with occasional reading in  The 170-180 range. Please have patient come in for a renin/aldosterone level per Dr. Tyrell Antonio last note. For now, have her increase hydralazine to 50 mg tid. If BP reading continue to be elevated go up to 100 mg tid. In the future Dr. Fletcher Anon may change hydralazine to spironolactone. For now would like to achieve goal BP.

## 2016-02-08 NOTE — Telephone Encounter (Signed)
Refill losartan. I cannot address her BP without knowing readings.

## 2016-02-08 NOTE — Telephone Encounter (Signed)
S/w pt of need for copy of BP readings she submitted. Pt has this tracked through [place] S/w Maudie Mercury at [facility] who faxed another copy. Given to Christell Faith, PA-C for review. Called pt w/Ryan's recommendations. Pt agreeable to labs June 29 in our office. New prescription sent to [facility]. Pt verbalized understanding of information w/no further questions.

## 2016-02-08 NOTE — Telephone Encounter (Signed)
For elevated blood pressures, Consider increasing hydralazine up to 50 mg 3 times a day Continue to monitor pressure We can call in a new prescription if needed

## 2016-02-10 ENCOUNTER — Other Ambulatory Visit (INDEPENDENT_AMBULATORY_CARE_PROVIDER_SITE_OTHER): Payer: PPO | Admitting: *Deleted

## 2016-02-10 DIAGNOSIS — I10 Essential (primary) hypertension: Secondary | ICD-10-CM

## 2016-02-11 ENCOUNTER — Ambulatory Visit (INDEPENDENT_AMBULATORY_CARE_PROVIDER_SITE_OTHER): Payer: PPO | Admitting: Family Medicine

## 2016-02-11 ENCOUNTER — Encounter: Payer: Self-pay | Admitting: Family Medicine

## 2016-02-11 VITALS — BP 116/62 | HR 66 | Temp 97.9°F | Ht 63.5 in | Wt 144.5 lb

## 2016-02-11 DIAGNOSIS — I10 Essential (primary) hypertension: Secondary | ICD-10-CM

## 2016-02-11 DIAGNOSIS — I679 Cerebrovascular disease, unspecified: Secondary | ICD-10-CM

## 2016-02-11 DIAGNOSIS — M797 Fibromyalgia: Secondary | ICD-10-CM | POA: Diagnosis not present

## 2016-02-11 DIAGNOSIS — E785 Hyperlipidemia, unspecified: Secondary | ICD-10-CM | POA: Diagnosis not present

## 2016-02-11 NOTE — Assessment & Plan Note (Addendum)
Pt does not wish to be aggressive with cholesterol treatment, refuses any medication.  Per cards... No indication for treatment with a statin given that she has no significant atherosclerosis. 10 year risk of atherosclerotic cardiovascular disease is calculated to be 11.1%. However, most of this is driven by age. Encouraged exercise, weight maintanance, healthy eating habits low chol diet.

## 2016-02-11 NOTE — Patient Instructions (Addendum)
Check your records to see when your last pneumovax or PCV23 was around 2007.  Work on low cholesterol diet, healthy eating. Exercise or yoga would be great.

## 2016-02-11 NOTE — Assessment & Plan Note (Signed)
Stable control on BID tramadol.

## 2016-02-11 NOTE — Progress Notes (Signed)
Subjective:    Patient ID: Kristin [name], female    DOB: [birth year], 75 y.o.   MRN: [id number]  HPI    01/28/2016 she saw Candis Musa, LPN for medicare wellness. Note  reviewed in detail.   Hypertension: Well controlled on dilitiazem, losartan, hydralazine and  Diltiazem. BP Readings from Last 3 Encounters:  02/11/16 116/62  01/28/16 144/70  12/27/15 140/60  Chest pain: none  SOB: none She does feel tired later in the day.  Fibromyalgia/inflammatory arthritis: Per rheumatology Dr. Titus Mould, Good control on tramadol 50 mg daily, does make her somewhat sleepy. MD recommended Enbrel.. She decided against it. She has chronic fatigue. No insomnia.   Seeing Dr. Maureen Chatters for spleep apnea. No longer seeing her given CPAP was not working for her. Dr Dohmeier discharged her.       Elevated Cholesterol: LDL now 20 points higher than 6 months ago.  Dr. Fletcher Anon does not feel we need to be aggressive with chol lowering.  She does have hx of MI and ? Small vessel changes in brain on MRI in 2008. No stroke. SHE REFUSES ANY ADDITION OF A CHOLESTEROL MED. Diet compliance: doing well Exercise: occ Other complaints:  Social History /Family History/Past Medical History reviewed and updated if needed.   Review of Systems Constitutional: Positive for fatigue. Negative for fever and unexpected weight change.  HENT: Negative for ear pain, congestion, sore throat, sneezing, trouble swallowing and sinus pressure.  Eyes: Negative for pain and itching.  Respiratory: Negative for cough, shortness of breath and wheezing.  Cardiovascular: Negative for chest pain, palpitations and leg swelling.  Gastrointestinal: Negative for nausea, Some intermittant 3/10, abdominal pain x 1 year in left upper quadrant Related to after eating, some gas no bloating, has history of [name], probiotic helps with this., diarrhea, constipation and blood in stool.  Genitourinary: Negative for dysuria, hematuria,  vaginal discharge, difficulty urinating and menstrual problem.  Skin: Negative for rash.  Neurological: Dizziness improved. Negative for syncope, weakness, light-headedness, numbness and headaches.  Psychiatric/Behavioral: Negative for confusion and dysphoric mood. The patient is not nervous/anxious.     Objective:   Physical Exam Constitutional: Vital signs are normal. She appears well-developed and well-nourished. She is cooperative. Non-toxic appearance. She does not appear ill. No distress.  HENT:  Head: Normocephalic.  Right Ear: Hearing, tympanic membrane, external ear and ear canal normal.  Left Ear: Hearing, tympanic membrane, external ear and ear canal normal.  Nose: Mucosal edema present.  Mouth/Throat: Uvula is midline, oropharynx is clear and moist and mucous membranes are normal.  Eyes: Conjunctivae, EOM and lids are normal. Pupils are equal, round, and reactive to light. No foreign bodies found.  Neck: Trachea normal and normal range of motion. Neck supple. Carotid bruit is not present. No mass and no thyromegaly present.  Cardiovascular: Normal rate, regular rhythm, S1 normal, S2 normal, normal heart sounds and intact distal pulses. Exam reveals no gallop.  No murmur heard.  Pulmonary/Chest: Effort normal and breath sounds normal. No respiratory distress. She has no wheezes. She has no rhonchi. She has no rales.  Abdominal: Soft. Normal appearance and bowel sounds are normal. She exhibits no distension, no fluid wave, no abdominal bruit and no mass. There is no hepatosplenomegaly.mild tenderness in left upper quadrant. There is no rebound, no guarding and no CVA tenderness. No hernia.  Lymphadenopathy:  She has no cervical adenopathy.  She has no axillary adenopathy.  Neurological: She is alert. She has normal strength. No cranial nerve deficit or  sensory deficit.  Skin: Skin is warm, dry and intact. No rash noted.  Multiple moles, no concerning skin lesions    Psychiatric: Her speech is normal and behavior is normal. Judgment normal. Her mood appears not anxious. Cognition and memory are normal. She does not exhibit a depressed mood.  Breast exam Bilaterally, no masses.       Assessment & Plan:    Hearing - failed at [facility] PART 1, she has not noted any hearing problems. Cognitive - Mini-Cog score: 17/20 at [facility] PART 1 Memory is not worse in last year.  we will follow over time.  Vaccines: uptodate with zoster, tdap and flu, ? PCV60 begore age 47? Does she need a repeat, she feels she may have the exact date on record at home. Mammogram: 04/2015 nml , discussed stopping at age 32. DVE/pap: not indicated, given age and S/P vaginal hysterectomy for noncancer Colon: 2011.. Nml, likely no repeat needed.  DEXA: 03/2012, nml repeat in 2018 Nonsmoker

## 2016-02-11 NOTE — Assessment & Plan Note (Signed)
Pt does not wish to be aggressive with cholesterol control. NO past history of CVA.

## 2016-02-11 NOTE — Assessment & Plan Note (Signed)
Good control on current regimen. 

## 2016-02-11 NOTE — Progress Notes (Signed)
Pre visit review using our clinic review tool, if applicable. No additional management support is needed unless otherwise documented below in the visit note. 

## 2016-02-16 ENCOUNTER — Telehealth: Payer: Self-pay | Admitting: Cardiovascular Disease

## 2016-02-16 ENCOUNTER — Telehealth: Payer: Self-pay | Admitting: Family Medicine

## 2016-02-16 NOTE — Telephone Encounter (Signed)
S/w pt who reports headache, dizziness, sleepy and urinating frequently since increasing hydralazine to 50mg  TID on 6/27.  (see previous phone note) Today she took  BP at the drug store one hour after taking morning meds:  BP 168/67 left arm; 151/61 right arm. This is the only time she has taken her BP since 6/21. She feels "like a wet, limp noodle" with no energy and feels it's related to the increased medication. Advised pt to monitor BP daily and if she feels necessary, she may want to decrease hydralazine dosage to 25mg  tonight only.  She understands to closely monitor BP. I will forward to Dr. Fletcher Anon to further advise. Pt agreeable w/plan w/no further questions at this time.

## 2016-02-16 NOTE — Telephone Encounter (Signed)
Vaccine already documented in chart.

## 2016-02-16 NOTE — Telephone Encounter (Signed)
Pt called stating she received her pneumococcal conjugate-13 vaccine    10/17/2013 @ [facility]

## 2016-02-16 NOTE — Telephone Encounter (Signed)
Pt c/o BP issue: STAT if pt c/o blurred vision, one-sided weakness or slurred speech  1. What are your last 5 BP readings? 02/16/16 9am 160/70 p 60,  2. Are you having any other symptoms (ex. Dizziness, headache, blurred vision, passed out)? Very dizzy, tired, headache  3. What is your BP issue? hydrALAZINE (APRESOLINE) 50 MG tablet x3 times a day.

## 2016-02-18 ENCOUNTER — Other Ambulatory Visit: Payer: Self-pay

## 2016-02-18 DIAGNOSIS — I10 Essential (primary) hypertension: Secondary | ICD-10-CM

## 2016-02-18 MED ORDER — SPIRONOLACTONE 25 MG PO TABS: 25.0000 mg | ORAL_TABLET | Freq: Every day | ORAL | Status: DC

## 2016-02-18 NOTE — Telephone Encounter (Signed)
Stopped hydralazine. Start spironolactone 25 mg once daily. Check basic metabolic profile in one week.

## 2016-02-18 NOTE — Telephone Encounter (Signed)
Reviewed Dr. Tyrell Antonio recommendations w/pt who verbalized understanding and is agreeable w/plan. Prescription submitted to [facility]. Labs 7/14, 8am

## 2016-02-20 LAB — ALDOSTERONE + RENIN ACTIVITY W/ RATIO
ALDOS/RENIN RATIO: 14 (ref 0.0–30.0)
ALDOSTERONE: 11.3 ng/dL (ref 0.0–30.0)
Renin: 0.81 ng/mL/hr (ref 0.167–5.380)

## 2016-02-21 DIAGNOSIS — M25552 Pain in left hip: Secondary | ICD-10-CM | POA: Diagnosis not present

## 2016-02-21 DIAGNOSIS — M069 Rheumatoid arthritis, unspecified: Secondary | ICD-10-CM | POA: Diagnosis not present

## 2016-02-21 DIAGNOSIS — M797 Fibromyalgia: Secondary | ICD-10-CM | POA: Diagnosis not present

## 2016-02-21 DIAGNOSIS — M5440 Lumbago with sciatica, unspecified side: Secondary | ICD-10-CM | POA: Diagnosis not present

## 2016-02-22 ENCOUNTER — Other Ambulatory Visit: Payer: Self-pay | Admitting: *Deleted

## 2016-02-22 MED ORDER — TRAMADOL HCL 50 MG PO TABS: 50.0000 mg | ORAL_TABLET | Freq: Three times a day (TID) | ORAL | Status: DC | PRN

## 2016-02-22 NOTE — Telephone Encounter (Signed)
Last office visit 02/11/2016.  Last refilled 09/13/2015 for #90 with no refills.  Ok to refill?

## 2016-02-22 NOTE — Telephone Encounter (Signed)
Tramadol called into [facility].

## 2016-02-25 ENCOUNTER — Other Ambulatory Visit (INDEPENDENT_AMBULATORY_CARE_PROVIDER_SITE_OTHER): Payer: PPO

## 2016-02-25 DIAGNOSIS — I10 Essential (primary) hypertension: Secondary | ICD-10-CM

## 2016-02-25 LAB — BASIC METABOLIC PANEL
BUN: 30 mg/dL — AB (ref 6–23)
CHLORIDE: 98 meq/L (ref 96–112)
CO2: 28 meq/L (ref 19–32)
CREATININE: 1.06 mg/dL (ref 0.40–1.20)
Calcium: 10.2 mg/dL (ref 8.4–10.5)
GFR: 53.65 mL/min — ABNORMAL LOW (ref >60.00)
Glucose, Bld: 90 mg/dL (ref 70–99)
POTASSIUM: 3.9 meq/L (ref 3.5–5.1)
Sodium: 136 mEq/L (ref 135–145)

## 2016-03-16 ENCOUNTER — Encounter: Payer: Self-pay | Admitting: Family Medicine

## 2016-03-16 ENCOUNTER — Ambulatory Visit (INDEPENDENT_AMBULATORY_CARE_PROVIDER_SITE_OTHER): Payer: PPO | Admitting: Family Medicine

## 2016-03-16 VITALS — BP 137/66 | HR 63 | Temp 97.7°F | Ht 63.5 in | Wt 142.8 lb

## 2016-03-16 DIAGNOSIS — R300 Dysuria: Secondary | ICD-10-CM | POA: Diagnosis not present

## 2016-03-16 DIAGNOSIS — N3001 Acute cystitis with hematuria: Secondary | ICD-10-CM | POA: Diagnosis not present

## 2016-03-16 LAB — POC URINALSYSI DIPSTICK (AUTOMATED)
BILIRUBIN UA: NEGATIVE
Glucose, UA: NEGATIVE
KETONES UA: NEGATIVE
NITRITE UA: NEGATIVE
PH UA: 7.5
Spec Grav, UA: 1.015
Urobilinogen, UA: 0.2

## 2016-03-16 MED ORDER — SULFAMETHOXAZOLE-TRIMETHOPRIM 800-160 MG PO TABS: 1.0000 | ORAL_TABLET | Freq: Two times a day (BID) | ORAL | 0 refills | Status: DC

## 2016-03-16 NOTE — Progress Notes (Signed)
Pre visit review using our clinic review tool, if applicable. No additional management support is needed unless otherwise documented below in the visit note. 

## 2016-03-16 NOTE — Assessment & Plan Note (Signed)
Treat with bactrim ds x 3 days.  Fluids.  Recheck UA for resolution of hematuria in 2 weeks.

## 2016-03-16 NOTE — Addendum Note (Signed)
Addended by: Carter Kitten on: 03/16/2016 12:16 PM   Modules accepted: Orders

## 2016-03-16 NOTE — Patient Instructions (Signed)
Start 3 day course of antibiotics.  Push fluids,and rest.  We will call with results  Of urine culture.  Call if symptoms not improving as expected, or fever on antibiotics.  Return in 2 weeks for OV to re-eval UA to make sure blood has resolved in urine.

## 2016-03-16 NOTE — Progress Notes (Signed)
   Subjective:    Patient ID: Kristin [name], female    DOB: July 16, 1941, 75 y.o.   MRN: [id number]  Dysuria   This is a new problem. The current episode started in the past 7 days (4 days). The problem has been gradually worsening. The quality of the pain is described as burning. The pain is at a severity of 5/10. The pain is moderate. There has been no fever. She is not sexually active. Associated symptoms include frequency. Pertinent negatives include no chills, discharge, flank pain, hematuria, nausea, urgency or vomiting. She has tried increased fluids ( cranberry) for the symptoms. The treatment provided no relief. There is no history of catheterization, kidney stones, recurrent UTIs, a single kidney, urinary stasis or a urological procedure.   Social History /Family History/Past Medical History reviewed and updated if needed.    Review of Systems  Constitutional: Negative for chills.  Gastrointestinal: Negative for nausea and vomiting.  Genitourinary: Positive for dysuria and frequency. Negative for flank pain, hematuria and urgency.       Objective:   Physical Exam  Constitutional: Vital signs are normal. She appears well-developed and well-nourished. She is cooperative.  Non-toxic appearance. She does not appear ill. No distress.  HENT:  Head: Normocephalic.  Right Ear: Hearing, tympanic membrane, external ear and ear canal normal. Tympanic membrane is not erythematous, not retracted and not bulging.  Left Ear: Hearing, tympanic membrane, external ear and ear canal normal. Tympanic membrane is not erythematous, not retracted and not bulging.  Nose: No mucosal edema or rhinorrhea. Right sinus exhibits no maxillary sinus tenderness and no frontal sinus tenderness. Left sinus exhibits no maxillary sinus tenderness and no frontal sinus tenderness.  Mouth/Throat: Uvula is midline, oropharynx is clear and moist and mucous membranes are normal.  Eyes: Conjunctivae, EOM and lids are  normal. Pupils are equal, round, and reactive to light. Lids are everted and swept, no foreign bodies found.  Neck: Trachea normal and normal range of motion. Neck supple. Carotid bruit is not present. No thyroid mass and no thyromegaly present.  Cardiovascular: Normal rate, regular rhythm, S1 normal, S2 normal, normal heart sounds, intact distal pulses and normal pulses.  Exam reveals no gallop and no friction rub.   No murmur heard. Pulmonary/Chest: Effort normal and breath sounds normal. No tachypnea. No respiratory distress. She has no decreased breath sounds. She has no wheezes. She has no rhonchi. She has no rales.  Abdominal: Soft. Normal appearance and bowel sounds are normal. There is no tenderness.  Neurological: She is alert.  Skin: Skin is warm, dry and intact. No rash noted.  Psychiatric: Her speech is normal and behavior is normal. Judgment and thought content normal. Her mood appears not anxious. Cognition and memory are normal. She does not exhibit a depressed mood.          Assessment & Plan:

## 2016-03-18 LAB — URINE CULTURE: Colony Count: >=100000

## 2016-03-28 ENCOUNTER — Other Ambulatory Visit: Payer: Self-pay | Admitting: *Deleted

## 2016-03-28 MED ORDER — DILTIAZEM HCL ER COATED BEADS 240 MG PO CP24: 240.0000 mg | ORAL_CAPSULE | Freq: Every day | ORAL | 3 refills | Status: DC

## 2016-03-31 ENCOUNTER — Ambulatory Visit (INDEPENDENT_AMBULATORY_CARE_PROVIDER_SITE_OTHER): Payer: PPO | Admitting: Family Medicine

## 2016-03-31 ENCOUNTER — Encounter: Payer: Self-pay | Admitting: Family Medicine

## 2016-03-31 VITALS — BP 131/69 | HR 74 | Temp 97.6°F | Ht 63.5 in | Wt 142.0 lb

## 2016-03-31 DIAGNOSIS — N3001 Acute cystitis with hematuria: Secondary | ICD-10-CM

## 2016-03-31 DIAGNOSIS — B370 Candidal stomatitis: Secondary | ICD-10-CM | POA: Diagnosis not present

## 2016-03-31 DIAGNOSIS — N3091 Cystitis, unspecified with hematuria: Secondary | ICD-10-CM | POA: Diagnosis not present

## 2016-03-31 LAB — POC URINALSYSI DIPSTICK (AUTOMATED)
BILIRUBIN UA: NEGATIVE
GLUCOSE UA: NEGATIVE
Ketones, UA: NEGATIVE
Leukocytes, UA: NEGATIVE
NITRITE UA: NEGATIVE
Protein, UA: NEGATIVE
RBC UA: NEGATIVE
SPEC GRAV UA: 1.02
Urobilinogen, UA: 0.2
pH, UA: 6

## 2016-03-31 MED ORDER — NYSTATIN 100000 UNIT/ML MT SUSP: 5.0000 mL | Freq: Four times a day (QID) | OROMUCOSAL | 1 refills | Status: DC

## 2016-03-31 NOTE — Progress Notes (Signed)
   Subjective:    Patient ID: Kristin [name], female    DOB: May 15, 1941, 75 y.o.   MRN: [id number]  HPI  75 year old female presents for 2 week follow up hematuria.  She was seen on 8/2 for UTI, hematuria seen.  Treated with  Bactrim DS x 3 days   Symptoms resolved.  Pt returns today for repeat UA  Urine dipstick shows negative for all components.  She has noted white coating on tounge in last week. No burning. She does have sore throat.  BP Readings from Last 3 Encounters:  03/31/16 131/69  03/16/16 137/66  02/11/16 116/62      Review of Systems  Constitutional: Negative for fatigue and fever.  HENT: Negative for ear pain.   Eyes: Negative for pain.  Respiratory: Negative for chest tightness and shortness of breath.   Cardiovascular: Negative for chest pain, palpitations and leg swelling.  Gastrointestinal: Negative for abdominal pain.  Genitourinary: Negative for dysuria.       Objective:   Physical Exam  Constitutional: Vital signs are normal. She appears well-developed and well-nourished. She is cooperative.  Non-toxic appearance. She does not appear ill. No distress.  HENT:  Head: Normocephalic.  Right Ear: Hearing, tympanic membrane, external ear and ear canal normal. Tympanic membrane is not erythematous, not retracted and not bulging.  Left Ear: Hearing, tympanic membrane, external ear and ear canal normal. Tympanic membrane is not erythematous, not retracted and not bulging.  Nose: No mucosal edema or rhinorrhea. Right sinus exhibits no maxillary sinus tenderness and no frontal sinus tenderness. Left sinus exhibits no maxillary sinus tenderness and no frontal sinus tenderness.  Mouth/Throat: Uvula is midline, oropharynx is clear and moist and mucous membranes are normal.  Furry yellowish iplaque on tounge, no other findings on buccal mucosa.  Eyes: Conjunctivae, EOM and lids are normal. Pupils are equal, round, and reactive to light. Lids are everted and swept,  no foreign bodies found.  Neck: Trachea normal and normal range of motion. Neck supple. Carotid bruit is not present. No thyroid mass and no thyromegaly present.  Cardiovascular: Normal rate, regular rhythm, S1 normal, S2 normal, normal heart sounds, intact distal pulses and normal pulses.  Exam reveals no gallop and no friction rub.   No murmur heard. Pulmonary/Chest: Effort normal and breath sounds normal. No tachypnea. No respiratory distress. She has no decreased breath sounds. She has no wheezes. She has no rhonchi. She has no rales.  Abdominal: Soft. Normal appearance and bowel sounds are normal. There is no tenderness.  Neurological: She is alert.  Skin: Skin is warm, dry and intact. No rash noted.  Psychiatric: Her speech is normal and behavior is normal. Judgment and thought content normal. Her mood appears not anxious. Cognition and memory are normal. She does not exhibit a depressed mood.          Assessment & Plan:

## 2016-03-31 NOTE — Progress Notes (Signed)
Pre visit review using our clinic review tool, if applicable. No additional management support is needed unless otherwise documented below in the visit note. 

## 2016-03-31 NOTE — Assessment & Plan Note (Signed)
Treat with Nystatin solution QID.

## 2016-03-31 NOTE — Assessment & Plan Note (Signed)
Resolved symptoms with bactrim and hematuria resolved as well.

## 2016-03-31 NOTE — Patient Instructions (Signed)
Blood in urine is resolved Use nysttain swish and spit as directed for thrush./

## 2016-04-03 ENCOUNTER — Encounter: Payer: PPO | Admitting: Gynecology

## 2016-04-10 ENCOUNTER — Encounter: Payer: PPO | Admitting: Gynecology

## 2016-04-27 ENCOUNTER — Encounter: Payer: Self-pay | Admitting: Podiatry

## 2016-04-27 ENCOUNTER — Ambulatory Visit (INDEPENDENT_AMBULATORY_CARE_PROVIDER_SITE_OTHER): Payer: PPO | Admitting: Podiatry

## 2016-04-27 DIAGNOSIS — Q828 Other specified congenital malformations of skin: Secondary | ICD-10-CM

## 2016-04-27 DIAGNOSIS — B351 Tinea unguium: Secondary | ICD-10-CM | POA: Diagnosis not present

## 2016-04-27 DIAGNOSIS — M79676 Pain in unspecified toe(s): Secondary | ICD-10-CM | POA: Diagnosis not present

## 2016-04-27 NOTE — Progress Notes (Signed)
Patient ID: Kristin [name], female   DOB: [birth year], 75 y.o.   MRN: [id number]  Subjective: 75 y.o. returns the office today for painful, elongated, thickened toenails which she cannot trim herself. Denies any redness or drainage around the nails. Also has painful callus on the ball of the left foot. Denies any acute changes since last appointment and no new complaints today. Denies any systemic complaints such as fevers, chills, nausea, vomiting.   Objective: AAO 3, NAD DP/PT pulses palpable, CRT less than 3 seconds Protective sensation intact with Simms Weinstein monofilament Nails hypertrophic, dystrophic, elongated, brittle, discolored 10. There is tenderness overlying the nails 1-5 bilaterally. There is no surrounding erythema or drainage along the nail sites. Hyperkeratotic lesion left submetatarsal one. Upon debridement no underlying ulceration, drainage or other signs of infection. No open lesions or pre-ulcerative lesions are identified. No other areas of tenderness bilateral lower extremities. No overlying edema, erythema, increased warmth. No pain with calf compression, swelling, warmth, erythema.  Assessment: Patient presents with symptomatic onychomycosis; hyperkeratotic lesion  Plan: -Treatment options including alternatives, risks, complications were discussed -Nails sharply debrided 10 without complication/bleeding. -Hyperkeratotic lesion debrided 1 without complications or bleeding. -Discussed daily foot inspection. If there are any changes, to call the office immediately.  -Follow-up in 3 months or sooner if any problems are to arise. In the meantime, encouraged to call the office with any questions, concerns, changes symptoms.  Celesta Gentile, DPM

## 2016-05-09 ENCOUNTER — Other Ambulatory Visit: Payer: Self-pay | Admitting: *Deleted

## 2016-05-09 MED ORDER — TRAMADOL HCL 50 MG PO TABS: 50.0000 mg | ORAL_TABLET | Freq: Three times a day (TID) | ORAL | 0 refills | Status: DC | PRN

## 2016-05-09 NOTE — Telephone Encounter (Signed)
Last office visit 03/31/2016.  Last refilled 02/22/2016 for #90 with no refills.

## 2016-05-09 NOTE — Telephone Encounter (Signed)
Tramadol called into [facility].

## 2016-06-07 ENCOUNTER — Other Ambulatory Visit: Payer: Self-pay

## 2016-06-07 MED ORDER — LOSARTAN POTASSIUM-HCTZ 100-25 MG PO TABS: 1.0000 | ORAL_TABLET | Freq: Every day | ORAL | 3 refills | Status: DC

## 2016-06-07 NOTE — Telephone Encounter (Signed)
Last refill 05/09/16, last OV 03/31/16. Ok to refill?

## 2016-06-08 ENCOUNTER — Other Ambulatory Visit: Payer: Self-pay | Admitting: *Deleted

## 2016-06-08 MED ORDER — LOSARTAN POTASSIUM-HCTZ 100-25 MG PO TABS: 1.0000 | ORAL_TABLET | Freq: Every day | ORAL | 3 refills | Status: DC

## 2016-06-08 MED ORDER — TRAMADOL HCL 50 MG PO TABS: 50.0000 mg | ORAL_TABLET | Freq: Three times a day (TID) | ORAL | 1 refills | Status: DC | PRN

## 2016-06-09 NOTE — Telephone Encounter (Signed)
Tramadol called into [facility].

## 2016-06-13 ENCOUNTER — Other Ambulatory Visit: Payer: Self-pay

## 2016-06-13 MED ORDER — SPIRONOLACTONE 25 MG PO TABS: 25.0000 mg | ORAL_TABLET | Freq: Every day | ORAL | 3 refills | Status: DC

## 2016-06-27 ENCOUNTER — Encounter: Payer: Self-pay | Admitting: Cardiovascular Disease

## 2016-06-27 ENCOUNTER — Ambulatory Visit (INDEPENDENT_AMBULATORY_CARE_PROVIDER_SITE_OTHER): Payer: PPO | Admitting: Cardiovascular Disease

## 2016-06-27 VITALS — BP 110/60 | HR 73 | Ht 62.0 in | Wt 144.5 lb

## 2016-06-27 DIAGNOSIS — I25111 Atherosclerotic heart disease of native coronary artery with angina pectoris with documented spasm: Secondary | ICD-10-CM | POA: Diagnosis not present

## 2016-06-27 DIAGNOSIS — I10 Essential (primary) hypertension: Secondary | ICD-10-CM

## 2016-06-27 DIAGNOSIS — I493 Ventricular premature depolarization: Secondary | ICD-10-CM

## 2016-06-27 NOTE — Patient Instructions (Signed)
Medication Instructions: Continue same medications.   Labwork: None.   Procedures/Testing: None.   Follow-Up: 6 months with Dr. Arida.   Any Additional Special Instructions Will Be Listed Below (If Applicable).     If you need a refill on your cardiac medications before your next appointment, please call your pharmacy.   

## 2016-06-27 NOTE — Progress Notes (Signed)
Cardiology Office Note   Date:  06/27/2016   ID:  Kristin [name], Kristin [name] 1941-05-14, MRN [id number]  PCP:  Eliezer Lofts, MD  Cardiologist:   Kathlyn Sacramento, MD   Chief Complaint  Patient presents with  . other    6 month f/u no complaints today. Meds reviewed verbally with pt.      History of Present Illness: Kristin [name] is a 75 y.o. female who presents for a follow-up visit regarding hypertension and frequent PVCs. 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   She had cardiac catheterization in 05/2012 which showed no significant [name]. Only mild distal LAD and RCA disease She has known palpitations due to PVCs and ventricular bigeminy.  We have been working on blood pressure control. Renal artery duplex showed no evidence of renal artery stenosis. Most recently, hydralazine was discontinued and spironolactone was added.  Since then , BP has been well controlled.  She denies chest pain, shortness of breath or palpitations.She is going to [place] for Thanksgiving.  Past Medical History:  Diagnosis Date  . Arthritis    recent falls -aug 2010  . [name] (coronary artery disease)    mi-1996  . Complication of anesthesia    pt states takes a long time to wake her wake up  . 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 Cough    no fever   . Degeneration of lumbar or lumbosacral intervertebral disc   . Dizziness    r/t meds  . Fibromyalgia   . History of shingles   . [name] (irritable bowel syndrome)    takes OTC [place] Negus colon health  . Joint pain   . Joint swelling   . Myalgia and myositis, unspecified   . Obstructive sleep apnea (adult) (pediatric)   . Old myocardial infarction 1996  . Osteoarthritis     . Osteoarthrosis, unspecified whether generalized or localized, unspecified site   . Psoriatic arthritis ([place])   . Rheumatoid arthritis ([place])   . Rosacea   . Seasonal allergies   . Stroke [facility])    ischaemic microvascular disease  . 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 Unspecified essential hypertension    takes Diltiazem,Metoprolol,and Losartan daily  . UTI (lower urinary tract infection)    frequent=last one was winter of 2014    Past Surgical History:  Procedure Laterality Date  . ATHERECTOMY  1996  . [place]   vasospams; no blockage  . CARDIAC CATHETERIZATION  2013   No obstructive coronary artery disease. Mild distal LAD and proximal RCA disease.  [name] CATARACT EXTRACTION Bilateral   . COLONOSCOPY    . CT ABD WO/W & PELVIS WO CM  2013   no acute process, + minimal diverticulosis and hepatic/renal cysts, ATH calcifications  . ETHMOIDECTOMY  1998  . 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 NASAL SINUS SURGERY  1998  . OOPHORECTOMY     BSO  . sinus biopsy  1998   inverted papiloma  . VAGINAL HYSTERECTOMY  1992   for  mennorhagia-LAVH BSO     Current Outpatient Prescriptions  Medication Sig Dispense Refill  . acetaminophen (TYLENOL) 325 MG tablet Take 325 mg by mouth every 8 (eight) hours as needed (pain).     [name] aspirin EC 81 MG tablet Take 81 mg by mouth daily. Reported on 10/19/2015    . Calcium Carbonate-Vitamin D (CALTRATE 600+D PO) Take 1 tablet by mouth 2 (two) times daily.     . Coenzyme Q10 200 MG capsule Take 200 mg by mouth daily.      [name] diltiazem (CARDIZEM CD) 240 MG 24 hr capsule Take 1 capsule (240 mg total) by mouth daily. 30 capsule 3  . Doxylamine Succinate, Sleep, (UNISOM) 25 MG tablet Take 12.5 mg by mouth at bedtime as needed for  sleep.     [name] econazole nitrate 1 % cream Apply 1 application topically 2 (two) times daily.    . Ginger, Zingiber officinalis, (GINGER ROOT) 250 MG CAPS Take 1 capsule by mouth daily.    [name] losartan-hydrochlorothiazide (HYZAAR) 100-25 MG tablet Take 1 tablet by mouth daily. 30 tablet 3  . Magnesium 250 MG TABS Take 250 mg by mouth daily.     . Multiple Vitamin (MULTIVITAMIN) tablet Take 1 tablet by mouth daily.      [name] neomycin-polymyxin-dexameth (MAXITROL) 0.1 % OINT Place 1 application into both eyes at bedtime.    . nitroGLYCERIN (NITROSTAT) 0.4 MG SL tablet Place 1 tablet (0.4 mg total) under the tongue every 5 (five) minutes as needed for chest pain. 30 tablet 3  . nystatin (MYCOSTATIN) 100000 UNIT/ML suspension Take 5 mLs (500,000 Units total) by mouth 4 (four) times daily. 200 mL 1  . Omega-3 Fatty Acids (FISH OIL) 1200 MG CAPS Take 1 capsule by mouth 2 (two) times daily.     [name] OVER THE COUNTER MEDICATION Take 1 capsule by mouth daily. Estroven herbal supplement    . OVER THE COUNTER MEDICATION Curcumin-One tablet by mouth daily    . Polyethyl Glycol-Propyl Glycol (SYSTANE) 0.4-0.3 % SOLN Apply 1 drop to eye daily as needed (dry eyes).     . Probiotic Product ([place]) CAPS Take 1 capsule by mouth daily.    [name] spironolactone (ALDACTONE) 25 MG tablet Take 1 tablet (25 mg total) by mouth daily. 30 tablet 3  . traMADol (ULTRAM) 50 MG tablet Take 1 tablet (50 mg total) by mouth every 8 (eight) hours as needed. 90 tablet 1  . triamcinolone cream (KENALOG) 0.1 % Apply 1 application topically 2 (two) times daily as needed.   0  . TURMERIC PO Take 1 tablet by mouth daily.    . vitamin B-12 (CYANOCOBALAMIN) 1000 MCG tablet Take 1,000 mcg by mouth daily.     No current facility-administered medications for this visit.     Allergies:   Angiotensin receptor blockers; Hydralazine; and Penicillins    Social History:  The patient  reports that she has never smoked. She has never used  smokeless tobacco. She reports that she does not drink alcohol or use drugs.   Family History:  The patient's family history includes Breast cancer in her paternal aunt; Colon cancer in her father; Coronary artery disease in her mother; GER disease in her sister and sister; Heart attack in her mother; Heart disease in her mother; Heart failure in her maternal grandfather, maternal grandmother, and mother; Hypertension in her sister; Osteopenia in her sister; Osteoporosis in her mother.    ROS:  Please see the history of present  illness.   Otherwise, review of systems are positive for none.   All other systems are reviewed and negative.    PHYSICAL EXAM: VS:  BP 110/60 (BP Location: Left Arm, Patient Position: Sitting, Cuff Size: Normal)   Pulse 73   Ht 5\' 2"  (1.575 m)   Wt 144 lb 8 oz (65.5 kg)   LMP 08/14/1989   BMI 26.43 kg/m  , BMI Body mass index is 26.43 kg/m. GEN: Well nourished, well developed, in no acute distress  HEENT: normal  Neck: no JVD, carotid bruits, or masses Cardiac: RRR; no murmurs, rubs, or gallops,no edema  Respiratory:  clear to auscultation bilaterally, normal work of breathing GI: soft, nontender, nondistended, + BS MS: no deformity or atrophy  Skin: warm and dry, no rash Neuro:  Strength and sensation are intact Psych: euthymic mood, full affect   EKG:  EKG is ordered today. Normal sinus rhythm with incomplete right bundle branch block.    Recent Labs: 01/27/2016: ALT 16 02/25/2016: BUN 30; Creatinine, Ser 1.06; Potassium 3.9; Sodium 136    Lipid Panel    Component Value Date/Time   CHOL 224 (H) 01/27/2016 1059   TRIG 102.0 01/27/2016 1059   HDL 80.50 01/27/2016 1059   CHOLHDL 3 01/27/2016 1059   VLDL 20.4 01/27/2016 1059   LDLCALC 123 (H) 01/27/2016 1059   LDLDIRECT 158.1 10/02/2013 0914      Wt Readings from Last 3 Encounters:  06/27/16 144 lb 8 oz (65.5 kg)  03/31/16 142 lb (64.4 kg)  03/16/16 142 lb 12 oz (64.8 kg)         ASSESSMENT AND PLAN:  1.  Essential hypertension: No evidence of secondary hypertension. Blood pressure is now well controlled after switching hydralazine to spironolactone. There was no evidence of hyperkalemia. She will need labs within the next 6 months. Continue same medications.  2. Coronary artery disease involving native coronary arteries with previous spasm: No anginal symptoms. Continue medical therapy.  3. 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    Disposition:   FU with me in 6 months  Signed,  Kathlyn Sacramento, MD  06/27/2016 2:05 PM    [place]

## 2016-06-30 ENCOUNTER — Ambulatory Visit: Payer: PPO | Admitting: Cardiovascular Disease

## 2016-07-14 ENCOUNTER — Telehealth: Payer: Self-pay

## 2016-07-14 ENCOUNTER — Ambulatory Visit (INDEPENDENT_AMBULATORY_CARE_PROVIDER_SITE_OTHER): Payer: PPO | Admitting: Family Medicine

## 2016-07-14 DIAGNOSIS — J069 Acute upper respiratory infection, unspecified: Secondary | ICD-10-CM | POA: Diagnosis not present

## 2016-07-14 DIAGNOSIS — B9789 Other viral agents as the cause of diseases classified elsewhere: Secondary | ICD-10-CM | POA: Diagnosis not present

## 2016-07-14 MED ORDER — BENZONATATE 200 MG PO CAPS: 200.0000 mg | ORAL_CAPSULE | Freq: Two times a day (BID) | ORAL | 0 refills | Status: DC | PRN

## 2016-07-14 NOTE — Telephone Encounter (Signed)
Jessica from [facility] left v/m; pt seen earlier and thought 2 meds were going to be sent to pharmacy. Benzonatate was sent to pharmacy and looking at office note pt was to take Mucinex DM also. Janett Billow voiced understanding and will let pt know; I tried to reach pt at [phone] but could not get phone to ring after 3 tries.FYI to Dr Diona Browner.

## 2016-07-14 NOTE — Patient Instructions (Addendum)
Mucinex DM twice daily for cough. Can use benzonatate for cough if needed. Call if not improving in next 3-4 days, call sooner if shortness of breath.

## 2016-07-14 NOTE — Progress Notes (Signed)
Pre visit review using our clinic review tool, if applicable. No additional management support is needed unless otherwise documented below in the visit note. 

## 2016-07-14 NOTE — Assessment & Plan Note (Signed)
Symptomatic care. No sign of bacterial infection. 

## 2016-07-14 NOTE — Progress Notes (Signed)
   Subjective:    Patient ID: Kristin [name], female    DOB: 09/28/1940, 75 y.o.   MRN: [id number]  Sore Throat   Maximum temperature: low grade temp 99 F. Associated symptoms include coughing. Pertinent negatives include no shortness of breath.  Cough  This is a new problem. The current episode started 1 to 4 weeks ago (11 days ago). The problem has been gradually worsening. The problem occurs constantly. The cough is productive of sputum. Associated symptoms include a fever, rhinorrhea and a sore throat. Pertinent negatives include no myalgias, shortness of breath or wheezing. The symptoms are aggravated by lying down (cough keeping her up at night). Risk factors: nonsmoker. She has tried rest (fluids) for the symptoms. The treatment provided mild relief.  [name], allergies  Fever   Associated symptoms include coughing and a sore throat. Pertinent negatives include no wheezing.      Review of Systems  Constitutional: Positive for fever.  HENT: Positive for rhinorrhea and sore throat.   Respiratory: Positive for cough. Negative for shortness of breath and wheezing.   Musculoskeletal: Negative for myalgias.       Objective:   Physical Exam  Constitutional: Vital signs are normal. She appears well-developed and well-nourished. She is cooperative.  Non-toxic appearance. She does not appear ill. No distress.  HENT:  Head: Normocephalic.  Right Ear: Hearing, tympanic membrane, external ear and ear canal normal. Tympanic membrane is not erythematous, not retracted and not bulging.  Left Ear: Hearing, tympanic membrane, external ear and ear canal normal. Tympanic membrane is not erythematous, not retracted and not bulging.  Nose: Mucosal edema and rhinorrhea present. Right sinus exhibits no maxillary sinus tenderness and no frontal sinus tenderness. Left sinus exhibits no maxillary sinus tenderness and no frontal sinus tenderness.  Mouth/Throat: Uvula is midline, oropharynx is clear and moist  and mucous membranes are normal.  Eyes: Conjunctivae, EOM and lids are normal. Pupils are equal, round, and reactive to light. Lids are everted and swept, no foreign bodies found.  Neck: Trachea normal and normal range of motion. Neck supple. Carotid bruit is not present. No thyroid mass and no thyromegaly present.  Cardiovascular: Normal rate, regular rhythm, S1 normal, S2 normal, normal heart sounds, intact distal pulses and normal pulses.  Exam reveals no gallop and no friction rub.   No murmur heard. Pulmonary/Chest: Effort normal and breath sounds normal. No tachypnea. No respiratory distress. She has no decreased breath sounds. She has no wheezes. She has no rhonchi. She has no rales.  Neurological: She is alert.  Skin: Skin is warm, dry and intact. No rash noted.  Psychiatric: Her speech is normal and behavior is normal. Judgment normal. Her mood appears not anxious. Cognition and memory are normal. She does not exhibit a depressed mood.          Assessment & Plan:

## 2016-07-26 ENCOUNTER — Other Ambulatory Visit: Payer: Self-pay | Admitting: *Deleted

## 2016-07-26 MED ORDER — DILTIAZEM HCL ER COATED BEADS 240 MG PO CP24: 240.0000 mg | ORAL_CAPSULE | Freq: Every day | ORAL | 3 refills | Status: DC

## 2016-08-29 ENCOUNTER — Ambulatory Visit (INDEPENDENT_AMBULATORY_CARE_PROVIDER_SITE_OTHER): Payer: PPO | Admitting: Podiatry

## 2016-08-29 DIAGNOSIS — M79672 Pain in left foot: Secondary | ICD-10-CM | POA: Diagnosis not present

## 2016-08-29 DIAGNOSIS — L608 Other nail disorders: Secondary | ICD-10-CM | POA: Diagnosis not present

## 2016-08-29 DIAGNOSIS — L603 Nail dystrophy: Secondary | ICD-10-CM

## 2016-08-29 DIAGNOSIS — M79671 Pain in right foot: Secondary | ICD-10-CM | POA: Diagnosis not present

## 2016-08-29 DIAGNOSIS — L851 Acquired keratosis [keratoderma] palmaris et plantaris: Secondary | ICD-10-CM

## 2016-08-29 DIAGNOSIS — B351 Tinea unguium: Secondary | ICD-10-CM | POA: Diagnosis not present

## 2016-08-29 DIAGNOSIS — L84 Corns and callosities: Secondary | ICD-10-CM | POA: Diagnosis not present

## 2016-08-29 DIAGNOSIS — M79609 Pain in unspecified limb: Secondary | ICD-10-CM

## 2016-08-29 MED ORDER — MUPIROCIN 2 % EX OINT: 1.0000 "application " | TOPICAL_OINTMENT | Freq: Two times a day (BID) | CUTANEOUS | 0 refills | Status: DC

## 2016-09-01 ENCOUNTER — Other Ambulatory Visit: Payer: Self-pay | Admitting: Family Medicine

## 2016-09-01 NOTE — Telephone Encounter (Signed)
Last office visit 07/14/2016.  Last refilled 06/08/2016 for #90 with 1 refill.  Ok to refill?

## 2016-09-01 NOTE — Telephone Encounter (Signed)
Tramadol called into [id number] - [place], [place] - [address] Phone: [phone] 

## 2016-09-03 NOTE — Progress Notes (Signed)
   SUBJECTIVE Patient  presents to office today complaining of elongated, thickened nails. Pain while ambulating in shoes. Patient is unable to trim their own nails.   OBJECTIVE General Patient is awake, alert, and oriented x 3 and in no acute distress. Derm symptomatic hyperkeratotic skin lesions also noted to the weightbearing surfaces of bilateral feet 4. Skin is dry and supple bilateral. Negative open lesions or macerations. Remaining integument unremarkable. Nails are tender, long, thickened and dystrophic with subungual debris, consistent with onychomycosis, 1-5 bilateral. No signs of infection noted. Vasc  DP and PT pedal pulses palpable bilaterally. Temperature gradient within normal limits.  Neuro Epicritic and protective threshold sensation diminished bilaterally.  Musculoskeletal Exam No symptomatic pedal deformities noted bilateral. Muscular strength within normal limits.  ASSESSMENT 1. Onychodystrophic nails 1-5 bilateral with hyperkeratosis of nails.  2. Onychomycosis of nail due to dermatophyte bilateral 3. Pain in foot bilateral 4. Symptomatic callus lesions bilateral feet 4  PLAN OF CARE 1. Patient evaluated today.  2. Instructed to maintain good pedal hygiene and foot care.  3. Mechanical debridement of nails 1-5 bilaterally performed using a nail nipper. Filed with dremel without incident.  4. Excisional debridement of the callus lesions was performed using a chisel blade without incident. 5. Return to clinic in 3 mos.    Edrick Kins, DPM [facility]  Dr. Edrick Kins, [address]                                        [place], [place] [address]                Office [phone]  Fax [phone]

## 2016-10-03 ENCOUNTER — Other Ambulatory Visit: Payer: Self-pay

## 2016-10-03 MED ORDER — SPIRONOLACTONE 25 MG PO TABS: 25.0000 mg | ORAL_TABLET | Freq: Every day | ORAL | 3 refills | Status: DC

## 2016-11-22 ENCOUNTER — Other Ambulatory Visit: Payer: Self-pay | Admitting: Family Medicine

## 2016-11-22 NOTE — Telephone Encounter (Signed)
Patient was seen on 03/31/16, last refill 09/01/16 #90 0 refills. Pt has a wellness visit scheduled on 02/19/17. Ok to refill?

## 2016-11-24 NOTE — Telephone Encounter (Signed)
[address] to [place] [place] [address] [phone]

## 2016-11-24 NOTE — Telephone Encounter (Signed)
FYI

## 2016-11-28 ENCOUNTER — Other Ambulatory Visit: Payer: Self-pay

## 2016-11-28 MED ORDER — DILTIAZEM HCL ER COATED BEADS 240 MG PO CP24: 240.0000 mg | ORAL_CAPSULE | Freq: Every day | ORAL | 0 refills | Status: DC

## 2016-11-30 DIAGNOSIS — L309 Dermatitis, unspecified: Secondary | ICD-10-CM | POA: Diagnosis not present

## 2016-12-01 ENCOUNTER — Other Ambulatory Visit: Payer: Self-pay | Admitting: Cardiovascular Disease

## 2016-12-01 MED ORDER — DILTIAZEM HCL ER COATED BEADS 240 MG PO CP24: 240.0000 mg | ORAL_CAPSULE | Freq: Every day | ORAL | 1 refills | Status: DC

## 2016-12-04 ENCOUNTER — Ambulatory Visit: Payer: PPO | Admitting: Podiatry

## 2016-12-14 ENCOUNTER — Other Ambulatory Visit: Payer: Self-pay | Admitting: Cardiovascular Disease

## 2016-12-14 MED ORDER — LOSARTAN POTASSIUM-HCTZ 100-25 MG PO TABS: 1.0000 | ORAL_TABLET | Freq: Every day | ORAL | 2 refills | Status: DC

## 2016-12-19 DIAGNOSIS — M797 Fibromyalgia: Secondary | ICD-10-CM | POA: Diagnosis not present

## 2016-12-19 DIAGNOSIS — L4050 Arthropathic psoriasis, unspecified: Secondary | ICD-10-CM | POA: Diagnosis not present

## 2016-12-19 DIAGNOSIS — M25571 Pain in right ankle and joints of right foot: Secondary | ICD-10-CM | POA: Diagnosis not present

## 2016-12-19 DIAGNOSIS — M19042 Primary osteoarthritis, left hand: Secondary | ICD-10-CM | POA: Diagnosis not present

## 2016-12-19 DIAGNOSIS — M25572 Pain in left ankle and joints of left foot: Secondary | ICD-10-CM | POA: Diagnosis not present

## 2016-12-19 DIAGNOSIS — M19041 Primary osteoarthritis, right hand: Secondary | ICD-10-CM | POA: Diagnosis not present

## 2016-12-19 DIAGNOSIS — M5440 Lumbago with sciatica, unspecified side: Secondary | ICD-10-CM | POA: Diagnosis not present

## 2016-12-19 DIAGNOSIS — M7731 Calcaneal spur, right foot: Secondary | ICD-10-CM | POA: Diagnosis not present

## 2016-12-19 DIAGNOSIS — L409 Psoriasis, unspecified: Secondary | ICD-10-CM | POA: Diagnosis not present

## 2016-12-20 DIAGNOSIS — M3501 Sicca syndrome with keratoconjunctivitis: Secondary | ICD-10-CM | POA: Diagnosis not present

## 2016-12-29 ENCOUNTER — Ambulatory Visit (INDEPENDENT_AMBULATORY_CARE_PROVIDER_SITE_OTHER): Payer: PPO | Admitting: Cardiovascular Disease

## 2016-12-29 ENCOUNTER — Encounter: Payer: Self-pay | Admitting: Cardiovascular Disease

## 2016-12-29 VITALS — BP 104/60 | HR 68 | Ht 66.5 in | Wt 142.0 lb

## 2016-12-29 DIAGNOSIS — I2510 Atherosclerotic heart disease of native coronary artery without angina pectoris: Secondary | ICD-10-CM

## 2016-12-29 DIAGNOSIS — I493 Ventricular premature depolarization: Secondary | ICD-10-CM

## 2016-12-29 DIAGNOSIS — I10 Essential (primary) hypertension: Secondary | ICD-10-CM | POA: Diagnosis not present

## 2016-12-29 MED ORDER — LOSARTAN POTASSIUM-HCTZ 100-12.5 MG PO TABS: 1.0000 | ORAL_TABLET | Freq: Every day | ORAL | 5 refills | Status: DC

## 2016-12-29 NOTE — Progress Notes (Signed)
Cardiology Office Note   Date:  12/29/2016   ID:  [name], [name] 1941/05/26, MRN [id number]  PCP:  Jinny Sanders, MD  Cardiologist:   Kathlyn Sacramento, MD   Chief Complaint  Patient presents with  . other    6 month follow up. Meds reviewed by the pt. verbally. Pt. c/o some dizziness. Pt. would like to know if she can use a lidocaine patch for her back.       History of Present Illness: Kristin [name] is a 76 y.o. female who presents for a follow-up visit regarding hypertension and frequent PVCs. 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   She had cardiac catheterization in 05/2012 which showed no significant [name]. Only mild distal LAD and RCA disease She has known palpitations due to PVCs and ventricular bigeminy.  She has known history of refractory hypertension with negative workup for secondary hypertension. Blood pressure improved significantly with spironolactone. She has been doing very well with no chest pain, shortness of breath or palpitations. She complains of occasional dizziness when blood pressure is low. I reviewed her home blood pressure readings which are overall controlled with occasional fluctuations. She is going on a trip to [place] soon.  Past Medical History:  Diagnosis Date  . Arthritis    recent falls -aug 2010  . [name] (coronary artery disease)    mi-1996  . Complication of anesthesia    pt states takes a long time to wake her wake up  . 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 Cough    no fever   . Degeneration of lumbar or lumbosacral intervertebral disc   . Dizziness    r/t meds  . Fibromyalgia   . History of shingles   . [name] (irritable bowel syndrome)    takes  OTC [place] Negus colon health  . Joint pain   . Joint swelling   . Myalgia and myositis, unspecified   . Obstructive sleep apnea (adult) (pediatric)   . Old myocardial infarction 1996  . Osteoarthritis   . Osteoarthrosis, unspecified whether generalized or localized, unspecified site   . Psoriatic arthritis ([place])   . Rheumatoid arthritis ([place])   . Rosacea   . Seasonal allergies   . Stroke [facility])    ischaemic microvascular disease  . 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 Unspecified essential hypertension    takes Diltiazem,Metoprolol,and Losartan daily  . UTI (lower urinary tract infection)    frequent=last one was winter of 2014    Past Surgical History:  Procedure Laterality Date  . ATHERECTOMY  1996  . [place]   vasospams; no blockage  . CARDIAC CATHETERIZATION  2013   No obstructive coronary artery disease. Mild distal LAD and proximal RCA disease.  [name] CATARACT EXTRACTION Bilateral   . COLONOSCOPY    . CT ABD WO/W & PELVIS WO CM  2013   no acute process, + minimal diverticulosis and hepatic/renal cysts, ATH calcifications  . ETHMOIDECTOMY  1998  . 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  NASAL SINUS SURGERY  1998  . OOPHORECTOMY     BSO  . sinus biopsy  1998   inverted papiloma  . VAGINAL HYSTERECTOMY  1992   for mennorhagia-LAVH BSO     Current Outpatient Prescriptions  Medication Sig Dispense Refill  . acetaminophen (TYLENOL) 325 MG tablet Take 325 mg by mouth every 8 (eight) hours as needed (pain).     [name] aspirin EC 81 MG tablet Take 81 mg by mouth daily. Reported on 10/19/2015    . benzonatate (TESSALON) 200 MG capsule Take 1 capsule (200 mg total) by mouth 2 (two) times daily as needed for cough. 20 capsule 0  . Calcium Carbonate-Vitamin D  (CALTRATE 600+D PO) Take 1 tablet by mouth 2 (two) times daily.     . Coenzyme Q10 200 MG capsule Take 200 mg by mouth daily.      [name] diltiazem (CARDIZEM CD) 240 MG 24 hr capsule Take 1 capsule (240 mg total) by mouth daily. 30 capsule 1  . Doxylamine Succinate, Sleep, (UNISOM) 25 MG tablet Take 12.5 mg by mouth at bedtime as needed for sleep.     [name] econazole nitrate 1 % cream Apply 1 application topically 2 (two) times daily.    . Ginger, Zingiber officinalis, (GINGER ROOT) 250 MG CAPS Take 1 capsule by mouth daily.    [name] losartan-hydrochlorothiazide (HYZAAR) 100-25 MG tablet Take 1 tablet by mouth daily. 30 tablet 2  . Magnesium 250 MG TABS Take 250 mg by mouth daily.     . Multiple Vitamin (MULTIVITAMIN) tablet Take 1 tablet by mouth daily.      . mupirocin ointment (BACTROBAN) 2 % Apply 1 application topically 2 (two) times daily. 22 g 0  . neomycin-polymyxin-dexameth (MAXITROL) 0.1 % OINT Place 1 application into both eyes at bedtime.    . nitroGLYCERIN (NITROSTAT) 0.4 MG SL tablet Place 1 tablet (0.4 mg total) under the tongue every 5 (five) minutes as needed for chest pain. 30 tablet 3  . Omega-3 Fatty Acids (FISH OIL) 1200 MG CAPS Take 1 capsule by mouth 2 (two) times daily.     [name] OVER THE COUNTER MEDICATION Take 1 capsule by mouth daily. Estroven herbal supplement    . OVER THE COUNTER MEDICATION Curcumin-One tablet by mouth daily    . Polyethyl Glycol-Propyl Glycol (SYSTANE) 0.4-0.3 % SOLN Apply 1 drop to eye daily as needed (dry eyes).     . Probiotic Product ([place]) CAPS Take 1 capsule by mouth daily.    [name] spironolactone (ALDACTONE) 25 MG tablet Take 1 tablet (25 mg total) by mouth daily. 30 tablet 3  . traMADol (ULTRAM) 50 MG tablet TAKE ONE TABLET BY MOUTH EVERY 8 HOURS AS NEEDED 90 tablet 0  . triamcinolone cream (KENALOG) 0.1 % Apply 1 application topically 2 (two) times daily as needed.   0  . TURMERIC PO Take 1 tablet by mouth daily.    . vitamin B-12  (CYANOCOBALAMIN) 1000 MCG tablet Take 1,000 mcg by mouth daily.     No current facility-administered medications for this visit.     Allergies:   Angiotensin receptor blockers; Hydralazine; and Penicillins    Social History:  The patient  reports that she has never smoked. She has never used smokeless tobacco. She reports that she does not drink alcohol or use drugs.   Family History:  The patient's family history includes Breast cancer in her paternal aunt; Colon cancer in her father; Coronary artery disease in her mother;  GER disease in her sister and sister; Heart attack in her mother; Heart disease in her mother; Heart failure in her maternal grandfather, maternal grandmother, and mother; Hypertension in her sister; Osteopenia in her sister; Osteoporosis in her mother.    ROS:  Please see the history of present illness.   Otherwise, review of systems are positive for none.   All other systems are reviewed and negative.    PHYSICAL EXAM: VS:  BP 104/60 (BP Location: Left Arm, Patient Position: Sitting, Cuff Size: Normal)   Pulse 68   Ht 5' 6.5" (1.689 m)   Wt 142 lb (64.4 kg)   LMP 08/14/1989   BMI 22.58 kg/m  , BMI Body mass index is 22.58 kg/m. GEN: Well nourished, well developed, in no acute distress  HEENT: normal  Neck: no JVD, carotid bruits, or masses Cardiac: RRR; no murmurs, rubs, or gallops,no edema  Respiratory:  clear to auscultation bilaterally, normal work of breathing GI: soft, nontender, nondistended, + BS MS: no deformity or atrophy  Skin: warm and dry, no rash Neuro:  Strength and sensation are intact Psych: euthymic mood, full affect   EKG:  EKG is ordered today. Normal sinus rhythm with nonspecific T wave changes.    Recent Labs: 01/27/2016: ALT 16 02/25/2016: BUN 30; Creatinine, Ser 1.06; Potassium 3.9; Sodium 136    Lipid Panel    Component Value Date/Time   CHOL 224 (H) 01/27/2016 1059   TRIG 102.0 01/27/2016 1059   HDL 80.50 01/27/2016 1059    CHOLHDL 3 01/27/2016 1059   VLDL 20.4 01/27/2016 1059   LDLCALC 123 (H) 01/27/2016 1059   LDLDIRECT 158.1 10/02/2013 0914      Wt Readings from Last 3 Encounters:  12/29/16 142 lb (64.4 kg)  07/14/16 145 lb 4 oz (65.9 kg)  06/27/16 144 lb 8 oz (65.5 kg)        ASSESSMENT AND PLAN:  1.  Essential hypertension:  Blood pressure is overall well controlled but she is having occasional low readings with dizziness. Thus, I elected to decrease the dose of losartan-hydrochlorothiazide to 100-12.5 mg once daily. Check basic metabolic profile today and continue other medications.  2. Coronary artery disease involving native coronary arteries with previous spasm: No anginal symptoms. Continue medical therapy.  3. 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    Disposition:   FU with me in 6 months  Signed,  Kathlyn Sacramento, MD  12/29/2016 10:27 AM    [place]

## 2016-12-29 NOTE — Patient Instructions (Addendum)
Medication Instructions:  Your physician has recommended you make the following change in your medication:  STOP taking losartan/HCTZ 100-25mg  START taking losartan/HCTZ 100-12.5mg  once daily   Labwork: BMET today  Testing/Procedures: none  Follow-Up: Your physician wants you to follow-up in: 6 months with Dr. Fletcher Anon.  You will receive a reminder letter in the mail two months in advance. If you don't receive a letter, please call our office to schedule the follow-up appointment.   Any Other Special Instructions Will Be Listed Below (If Applicable).     If you need a refill on your cardiac medications before your next appointment, please call your pharmacy.

## 2016-12-30 LAB — BASIC METABOLIC PANEL
BUN/Creatinine Ratio: 29 — ABNORMAL HIGH (ref 12–28)
BUN: 58 mg/dL — AB (ref 8–27)
CALCIUM: 12 mg/dL — AB (ref 8.7–10.3)
CO2: 23 mmol/L (ref 18–29)
Chloride: 95 mmol/L — ABNORMAL LOW (ref 96–106)
Creatinine, Ser: 2.03 mg/dL — ABNORMAL HIGH (ref 0.57–1.00)
GFR, EST AFRICAN AMERICAN: 27 mL/min/{1.73_m2} — AB (ref >59)
GFR, EST NON AFRICAN AMERICAN: 23 mL/min/{1.73_m2} — AB (ref >59)
Glucose: 120 mg/dL — ABNORMAL HIGH (ref 65–99)
Potassium: 4.7 mmol/L (ref 3.5–5.2)
Sodium: 136 mmol/L (ref 134–144)

## 2017-01-01 ENCOUNTER — Other Ambulatory Visit: Payer: Self-pay

## 2017-01-01 DIAGNOSIS — E860 Dehydration: Secondary | ICD-10-CM

## 2017-01-03 ENCOUNTER — Other Ambulatory Visit
Admission: RE | Admit: 2017-01-03 | Discharge: 2017-01-03 | Disposition: A | Discharge status: Home / Self Care | Payer: PPO | Source: Ambulatory Visit | Attending: Cardiovascular Disease | Admitting: Cardiovascular Disease

## 2017-01-03 DIAGNOSIS — E860 Dehydration: Secondary | ICD-10-CM | POA: Insufficient documentation

## 2017-01-03 LAB — BASIC METABOLIC PANEL
Anion gap: 6 (ref 5–15)
BUN: 45 mg/dL — ABNORMAL HIGH (ref 6–20)
CO2: 27 mmol/L (ref 22–32)
Calcium: 10.6 mg/dL — ABNORMAL HIGH (ref 8.9–10.3)
Chloride: 102 mmol/L (ref 101–111)
Creatinine, Ser: 1.44 mg/dL — ABNORMAL HIGH (ref 0.44–1.00)
GFR calc Af Amer: 40 mL/min — ABNORMAL LOW (ref >60)
GFR calc non Af Amer: 34 mL/min — ABNORMAL LOW (ref >60)
Glucose, Bld: 109 mg/dL — ABNORMAL HIGH (ref 65–99)
Potassium: 3.9 mmol/L (ref 3.5–5.1)
Sodium: 135 mmol/L (ref 135–145)

## 2017-01-04 ENCOUNTER — Telehealth: Payer: Self-pay | Admitting: Cardiovascular Disease

## 2017-01-04 NOTE — Telephone Encounter (Signed)
Pt calling to let us know BP around 230 today was 140/60   Please call back

## 2017-01-04 NOTE — Telephone Encounter (Signed)
Please result note I forwarded to Alysia Penna.

## 2017-01-04 NOTE — Telephone Encounter (Signed)
Pt c/o medication issue:  1. Name of Medication:  Losartan    2. How are you currently taking this medication (dosage and times per day)?  100-12.5mg  in the morning    3. Are you having a reaction (difficulty breathing--STAT)?   4. What is your medication issue?  Losartan makes her feels confused.  She states she feels like a "rag"  She is going out of town in a few weeks would like to fix this before then.  Not sure if she needs to stay on Cardizem. She is afraid to try another medication   Please advise

## 2017-01-04 NOTE — Telephone Encounter (Signed)
Returned call to patient. Husband was home at this time who helps the patient with her medications. Husband stated patient has NOT taken losartan/HCTZ or spironolactone since advised to hold on 01/01/17. Husband stated he's got Gatorade and water for the patient to drink. Patient cannot find her BP cuff and does not have any BP readings from the past few days. Said she would go up to the drug store to take it. Recommended it would be beneficial to have a BP cuff at home and record daily readings of BP and HR instead of having to go up to the drug store as we need to know what her BP is running at home. They said they will look for the BP cuff or possibly purchase a new one.  Patient stated she was feeling better since eating lunch and drinking fluids.  Advised to continue to increase fluids, take BP/HR readings each day and call us back with an update of readings over the next few days, and continue to hold Losartan/HCTZ and spironolactone. Husband and patient verbalized understanding.

## 2017-01-04 NOTE — Telephone Encounter (Signed)
Returned call to patient.  She reports feeling "weird, like a dish rag." She says she took her hydralazine this morning.  This was discontinued in July of 2017 for similar symptoms. She said she felt like she should take it but could not elaborate any more. Had patient take out her medication bottles and review that she was taking each one appropriately. Told her to discard the hydralazine. She could not find her spironolactone but said it should be here somewhere. Usually, her husband helps her with her medications but he is at pulmonary rehab this morning. He usually questions her as to why she doesn't keep her medication bottle in the same place. Patient seemed not confused but nervous and worrisome. She stated that "I am crazy." Denies chest pain, dizziness, or SOB. Just "always tired."   Advised patient to discard her hydralazine and not take any other medications until her husband got home to help her. Advised to call 911 if she feel like she is going to pass out, becomes short of breath, chest pain or any other new or worsening symptoms. Patient verbalized understanding. Will route to Dr Fletcher Anon.

## 2017-01-04 NOTE — Telephone Encounter (Signed)
Verified with patient her BP was 140/60 with HR 64. 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 Advised that an automatic cuff would be ideal way to record BP daily if possible. She stated this method worked just fine. Will make Dr Fletcher Anon aware.

## 2017-01-04 NOTE — Telephone Encounter (Signed)
Pt is returning your call

## 2017-01-05 ENCOUNTER — Other Ambulatory Visit: Payer: Self-pay

## 2017-01-05 DIAGNOSIS — I10 Essential (primary) hypertension: Secondary | ICD-10-CM

## 2017-01-05 NOTE — Telephone Encounter (Signed)
Continue to monitor BP off Losartan-HCTZ and Aldactone. Repeat BMP on Monday.

## 2017-01-05 NOTE — Telephone Encounter (Signed)
Reviewed MD recommendations w/pt who verbalized understanding, repeated back to me, and is agreeable w/plan. She understands to have repeat labs Monday, May 28 at the [facility] outpatient lab. Orders placed.

## 2017-01-08 ENCOUNTER — Other Ambulatory Visit
Admission: RE | Admit: 2017-01-08 | Discharge: 2017-01-08 | Disposition: A | Discharge status: Home / Self Care | Payer: PPO | Source: Ambulatory Visit | Attending: Cardiovascular Disease | Admitting: Cardiovascular Disease

## 2017-01-08 DIAGNOSIS — I10 Essential (primary) hypertension: Secondary | ICD-10-CM | POA: Diagnosis not present

## 2017-01-08 LAB — BASIC METABOLIC PANEL
ANION GAP: 8 (ref 5–15)
BUN: 26 mg/dL — ABNORMAL HIGH (ref 6–20)
CALCIUM: 9.7 mg/dL (ref 8.9–10.3)
CO2: 25 mmol/L (ref 22–32)
Chloride: 103 mmol/L (ref 101–111)
Creatinine, Ser: 1.4 mg/dL — ABNORMAL HIGH (ref 0.44–1.00)
GFR calc Af Amer: 41 mL/min — ABNORMAL LOW (ref >60)
GFR calc non Af Amer: 35 mL/min — ABNORMAL LOW (ref >60)
Glucose, Bld: 104 mg/dL — ABNORMAL HIGH (ref 65–99)
Potassium: 3.6 mmol/L (ref 3.5–5.1)
Sodium: 136 mmol/L (ref 135–145)

## 2017-01-29 ENCOUNTER — Ambulatory Visit: Payer: PPO

## 2017-02-01 DIAGNOSIS — N3 Cystitis: Secondary | ICD-10-CM | POA: Diagnosis not present

## 2017-02-06 ENCOUNTER — Encounter: Payer: PPO | Admitting: Family Medicine

## 2017-02-08 ENCOUNTER — Encounter: Payer: Self-pay | Admitting: Emergency Medicine

## 2017-02-08 ENCOUNTER — Emergency Department
Admission: EM | Admit: 2017-02-08 | Discharge: 2017-02-08 | Disposition: A | Discharge status: Home / Self Care | Payer: PPO | Attending: Emergency Medicine | Admitting: Emergency Medicine

## 2017-02-08 ENCOUNTER — Emergency Department: Payer: PPO

## 2017-02-08 ENCOUNTER — Telehealth: Payer: Self-pay | Admitting: Family Medicine

## 2017-02-08 DIAGNOSIS — Z8673 Personal history of transient ischemic attack (TIA), and cerebral infarction without residual deficits: Secondary | ICD-10-CM | POA: Diagnosis not present

## 2017-02-08 DIAGNOSIS — Z880 Allergy status to penicillin: Secondary | ICD-10-CM | POA: Insufficient documentation

## 2017-02-08 DIAGNOSIS — I824Z3 Acute embolism and thrombosis of unspecified deep veins of distal lower extremity, bilateral: Secondary | ICD-10-CM

## 2017-02-08 DIAGNOSIS — R2243 Localized swelling, mass and lump, lower limb, bilateral: Secondary | ICD-10-CM | POA: Diagnosis not present

## 2017-02-08 DIAGNOSIS — I82403 Acute embolism and thrombosis of unspecified deep veins of lower extremity, bilateral: Secondary | ICD-10-CM | POA: Diagnosis not present

## 2017-02-08 DIAGNOSIS — I119 Hypertensive heart disease without heart failure: Secondary | ICD-10-CM | POA: Insufficient documentation

## 2017-02-08 DIAGNOSIS — R609 Edema, unspecified: Secondary | ICD-10-CM | POA: Diagnosis not present

## 2017-02-08 DIAGNOSIS — Z789 Other specified health status: Secondary | ICD-10-CM

## 2017-02-08 DIAGNOSIS — M7989 Other specified soft tissue disorders: Secondary | ICD-10-CM | POA: Diagnosis not present

## 2017-02-08 DIAGNOSIS — I25111 Atherosclerotic heart disease of native coronary artery with angina pectoris with documented spasm: Secondary | ICD-10-CM | POA: Insufficient documentation

## 2017-02-08 DIAGNOSIS — R600 Localized edema: Secondary | ICD-10-CM | POA: Diagnosis not present

## 2017-02-08 LAB — COMPREHENSIVE METABOLIC PANEL
ALK PHOS: 53 U/L (ref 38–126)
ALT: 16 U/L (ref 14–54)
AST: 27 U/L (ref 15–41)
Albumin: 3.9 g/dL (ref 3.5–5.0)
Anion gap: 8 (ref 5–15)
BILIRUBIN TOTAL: 0.4 mg/dL (ref 0.3–1.2)
BUN: 27 mg/dL — AB (ref 6–20)
CO2: 26 mmol/L (ref 22–32)
CREATININE: 1.12 mg/dL — AB (ref 0.44–1.00)
Calcium: 9.3 mg/dL (ref 8.9–10.3)
Chloride: 103 mmol/L (ref 101–111)
GFR calc Af Amer: 54 mL/min — ABNORMAL LOW (ref >60)
GFR, EST NON AFRICAN AMERICAN: 46 mL/min — AB (ref >60)
Glucose, Bld: 118 mg/dL — ABNORMAL HIGH (ref 65–99)
Potassium: 3.4 mmol/L — ABNORMAL LOW (ref 3.5–5.1)
Sodium: 137 mmol/L (ref 135–145)
TOTAL PROTEIN: 7.4 g/dL (ref 6.5–8.1)

## 2017-02-08 LAB — URINALYSIS, COMPLETE (UACMP) WITH MICROSCOPIC
Bacteria, UA: NONE SEEN
Bilirubin Urine: NEGATIVE
GLUCOSE, UA: NEGATIVE mg/dL
Hgb urine dipstick: NEGATIVE
KETONES UR: NEGATIVE mg/dL
Leukocytes, UA: NEGATIVE
Nitrite: NEGATIVE
PH: 6 (ref 5.0–8.0)
Protein, ur: NEGATIVE mg/dL
SPECIFIC GRAVITY, URINE: 1.014 (ref 1.005–1.030)

## 2017-02-08 LAB — CBC WITH DIFFERENTIAL/PLATELET
BASOS ABS: 0 10*3/uL (ref 0–0.1)
Basophils Relative: 1 %
EOS ABS: 0.4 10*3/uL (ref 0–0.7)
EOS PCT: 4 %
HCT: 34.4 % — ABNORMAL LOW (ref 35.0–47.0)
Hemoglobin: 11.7 g/dL — ABNORMAL LOW (ref 12.0–16.0)
LYMPHS ABS: 0.6 10*3/uL — AB (ref 1.0–3.6)
Lymphocytes Relative: 6 %
MCH: 33.6 pg (ref 26.0–34.0)
MCHC: 33.9 g/dL (ref 32.0–36.0)
MCV: 98.9 fL (ref 80.0–100.0)
MONO ABS: 0.6 10*3/uL (ref 0.2–0.9)
Monocytes Relative: 6 %
Neutro Abs: 7.4 10*3/uL — ABNORMAL HIGH (ref 1.4–6.5)
Neutrophils Relative %: 83 %
PLATELETS: 298 10*3/uL (ref 150–440)
RBC: 3.48 MIL/uL — AB (ref 3.80–5.20)
RDW: 14.3 % (ref 11.5–14.5)
WBC: 9 10*3/uL (ref 3.6–11.0)

## 2017-02-08 MED ORDER — ASPIRIN EC 325 MG PO TBEC: 325.0000 mg | DELAYED_RELEASE_TABLET | Freq: Every day | ORAL | 3 refills | Status: AC

## 2017-02-08 NOTE — Telephone Encounter (Signed)
Patient Name: Kristin [name]  DOB: August 22, 1940    Initial Comment Caller states, she is having leg swelling with redness- travelling redness. No numbness and tingling.    Nurse Assessment  Nurse: Raphael Gibney, RN, Vanita Ingles Date/Time (Eastern Time): 02/08/2017 11:20:14 AM  Confirm and document reason for call. If symptomatic, describe symptoms. ---Caller states she rode a bus to [place] and went on a ship in [place]. She got home last night. Has swelling in both legs. Swelling was initially up to her knees but she elevated it and it is above her ankles. Has redness where her swelling is.  Does the patient have any new or worsening symptoms? ---Yes  Will a triage be completed? ---Yes  Related visit to physician within the last 2 weeks? ---No  Does the PT have any chronic conditions? (i.e. diabetes, asthma, etc.) ---Yes  List chronic conditions. ---fibromyalgia; RA  Is this a behavioral health or substance abuse call? ---No     Guidelines    Guideline Title Affirmed Question Affirmed Notes  Leg Swelling and Edema [1] Red area or streak [2] large (> 2 in. or 5 cm)    Final Disposition User   See Physician within 4 Hours (or PCP triage) Raphael Gibney, RN, [place]    Comments  No appts available within 4 hrs at [facility] or [place]. Pt does not want to go to urgent care or another office. Please call pt back regarding appt.   Referrals  GO TO FACILITY REFUSED   Disagree/Comply: Disagree  Disagree/Comply Reason: Disagree with instructions

## 2017-02-08 NOTE — ED Provider Notes (Signed)
[facility] Emergency Department Provider Note       Time seen: ----------------------------------------- 5:35 PM on 02/08/2017 -----------------------------------------     I have reviewed the triage vital signs and the nursing notes.   HISTORY   Chief Complaint Leg Swelling    HPI Kristin [name] is a 76 y.o. female who presents to the ED for bilateral leg pain and swelling for the last 2 days. Patient states she noticed pain and swelling after a recent trip to [place] that included plane flights in bus rides. She was concerned she had blood clots or legs. She reports pain to both legs but notes the swelling is worse in the left leg. She denies any other recent illness or symptoms.   Past Medical History:  Diagnosis Date  . Arthritis    recent falls -aug 2010  . [name] (coronary artery disease)    mi-1996  . Complication of anesthesia    pt states takes a long time to wake her wake up  . 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 Cough    no fever   . Degeneration of lumbar or lumbosacral intervertebral disc   . Dizziness    r/t meds  . Fibromyalgia   . History of shingles   . [name] (irritable bowel syndrome)    takes OTC Hardin Negus colon health  . Joint pain   . Joint swelling   . Myalgia and myositis, unspecified   . Obstructive sleep apnea (adult) (pediatric)   . Old myocardial infarction 1996  . Osteoarthritis   . Osteoarthrosis, unspecified whether generalized or localized, unspecified site   . Psoriatic arthritis ([place])   . Rheumatoid arthritis ([place])   . Rosacea   . Seasonal allergies   . Stroke [facility])    ischaemic microvascular disease  . 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 Unspecified essential hypertension    takes Diltiazem,Metoprolol,and Losartan daily  . UTI (lower urinary tract infection)    frequent=last one was winter of 2014    Patient Active Problem List   Diagnosis Date Noted  . Viral URI with cough 07/14/2016  . 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 Counseling regarding end of life decision making 01/14/2015  . Oral candidiasis 10/27/2014  . Short-term memory loss 10/17/2013  . Urinary tract infection with hematuria 10/17/2013  . Cerebral vascular disease 01/01/2013  . 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[place])   . PVC's (premature ventricular contractions) 06/03/2012  . Post herpetic neuralgia 10/11/2011  . Osteoarthritis 08/10/2011  . MYOCARDIAL INFARCTION, HX OF 08/01/2010  . 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 Fibromyalgia 05/20/2008  . Hyperlipidemia 05/29/2007  . OBSTRUCTIVE SLEEP APNEA 05/29/2007  . Essential hypertension, benign 05/29/2007  . ALLERGIC RHINITIS 05/29/2007  . ACNE, ROSACEA 05/29/2007    Past Surgical History:  Procedure Laterality Date  . ATHERECTOMY  1996  . [place]   vasospams; no blockage  . CARDIAC CATHETERIZATION  2013   No obstructive coronary artery disease. Mild distal LAD and proximal RCA disease.  [name] CATARACT EXTRACTION Bilateral   . COLONOSCOPY    . CT ABD WO/W & PELVIS WO CM  2013   no acute process, + minimal diverticulosis and hepatic/renal cysts, ATH calcifications  . ETHMOIDECTOMY  1998  . 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 NASAL SINUS SURGERY  1998  . OOPHORECTOMY     BSO  . sinus biopsy  1998   inverted papiloma  . VAGINAL HYSTERECTOMY  1992   for mennorhagia-LAVH BSO    Allergies Angiotensin receptor blockers; Hydralazine; and Penicillins  Social History Social History  Substance Use Topics  . Smoking status: Never  Smoker  . Smokeless tobacco: Never Used  . Alcohol use No     Comment: Rare    Review of Systems Constitutional: Negative for fever. Eyes: Negative for vision changes ENT:  Negative for congestion, sore throat Cardiovascular: Negative for chest pain. Respiratory: Negative for shortness of breath. Gastrointestinal: Negative for abdominal pain, vomiting and diarrhea. Genitourinary: Negative for dysuria. Musculoskeletal: Positive for leg swelling and pain Skin: Negative for rash. Neurological: Negative for headaches, focal weakness or numbness.  All systems negative/normal/unremarkable except as stated in the HPI  ____________________________________________   PHYSICAL EXAM:  VITAL SIGNS: ED Triage Vitals  Enc Vitals Group     BP 02/08/17 1542 (!) 122/104     Pulse Rate 02/08/17 1542 79     Resp 02/08/17 1542 18     Temp 02/08/17 1542 98.6 F (37 C)     Temp Source 02/08/17 1542 Oral     SpO2 02/08/17 1542 100 %     Weight 02/08/17 1543 142 lb (64.4 kg)     Height 02/08/17 1543 5\' 4"  (1.626 m)     Head Circumference --      Peak Flow --      Pain Score 02/08/17 1542 5     Pain Loc --      Pain Edu? --      Excl. in [place]? --     Constitutional: Alert and oriented. Well appearing and in no distress. Eyes: Conjunctivae are normal. Normal extraocular movements. ENT   Head: Normocephalic and atraumatic.   Nose: No congestion/rhinnorhea.   Mouth/Throat: Mucous membranes are moist.   Neck: No stridor. Cardiovascular: Normal rate, regular rhythm. No murmurs, rubs, or gallops. Respiratory: Normal respiratory effort without tachypnea nor retractions. Breath sounds are clear and equal bilaterally. No wheezes/rales/rhonchi. Gastrointestinal: Soft and nontender. Normal bowel sounds Musculoskeletal: Nontender with normal range of motion in extremities. Mild edema in the lower extremities bilaterally Neurologic:  Normal speech and language. No gross focal neurologic  deficits are appreciated.  Skin:  Skin is warm, dry and intact. No rash noted. Psychiatric: Mood and affect are normal. Speech and behavior are normal.  ____________________________________________  ED COURSE:  Pertinent labs & imaging results that were available during my care of the patient were reviewed by me and considered in my medical decision making (see chart for details). Patient presents for edema, we will assess with labs and imaging as indicated.   Procedures ____________________________________________   LABS (pertinent positives/negatives)  Labs Reviewed  COMPREHENSIVE METABOLIC PANEL - Abnormal; Notable for the following:       Result Value   Potassium 3.4 (*)    Glucose, Bld 118 (*)    BUN 27 (*)    Creatinine, Ser 1.12 (*)    GFR calc non Af Amer 46 (*)    GFR calc Af Amer 54 (*)    All other components within normal limits  CBC WITH DIFFERENTIAL/PLATELET - Abnormal; Notable for the following:    RBC  3.48 (*)    Hemoglobin 11.7 (*)    HCT 34.4 (*)    Neutro Abs 7.4 (*)    Lymphs Abs 0.6 (*)    All other components within normal limits  URINALYSIS, COMPLETE (UACMP) WITH MICROSCOPIC - Abnormal; Notable for the following:    Color, Urine YELLOW (*)    APPearance CLEAR (*)    Squamous Epithelial / LPF 0-5 (*)    All other components within normal limits    RADIOLOGY  US venous bilaterally  IMPRESSION: BILATERAL below knee deep venous thrombosis involving RIGHT posterior tibial vein and LEFT posterior tibial and peroneal veins.  No above knee deep venous thrombosis seen in either lower extremity. ____________________________________________  FINAL ASSESSMENT AND PLAN  Distal DVT's, peripheral edema  Plan: Patient's labs and imaging were dictated above. Patient had presented for leg swelling and pain which is from distal blood clots in the legs. At this point we would not recommend anticoagulation other than aspirin. I have sent a note to her primary  care doctor to have a repeat ultrasound in a week to evaluate for propagation. I have advised to take an adult aspirin daily. Her kidney function is improved, she is stable for outpatient follow-up.   Earleen Newport, MD   Note: This note was generated in part or whole with voice recognition software. Voice recognition is usually quite accurate but there are transcription errors that can and very often do occur. I apologize for any typographical errors that were not detected and corrected.     Earleen Newport, MD 02/08/17 Valerie Roys

## 2017-02-08 NOTE — ED Triage Notes (Signed)
Patient presents to the ED with bilateral leg pain and swelling for 2 days.  Patient states she noticed pain and swelling after a trip that included plane rides and bus rides.  Patient reports pain to both legs but swelling is worse in patient's left leg.  Patient is in no obvious distress at this time.

## 2017-02-08 NOTE — Telephone Encounter (Signed)
I spoke with Kristin [name] and she will go to [facility] in [place].FYI to Dr Diona Browner

## 2017-02-12 NOTE — Progress Notes (Signed)
Subjective:   Kristin [name] is a 76 y.o. female who presents for Medicare Annual (Subsequent) preventive examination.  Review of Systems:  No ROS.  Medicare Wellness Visit. Additional risk factors are reflected in the social history.  Cardiac Risk Factors include: advanced age (>72men, >25 women);dyslipidemia;hypertension     Objective:     Vitals: BP (!) 142/70   Pulse 63   Resp 16   Ht 5\' 4"  (1.626 m)   Wt 140 lb (63.5 kg)   LMP 08/14/1989   SpO2 98%   BMI 24.03 kg/m   Body mass index is 24.03 kg/m.   Tobacco History  Smoking Status  . Never Smoker  Smokeless Tobacco  . Never Used     Counseling given: Not Answered   Past Medical History:  Diagnosis Date  . Arthritis    recent falls -aug 2010  . [name] (coronary artery disease)    mi-1996  . Complication of anesthesia    pt states takes a long time to wake her wake up  . 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 Cough    no fever   . Degeneration of lumbar or lumbosacral intervertebral disc   . Dizziness    r/t meds  . Fibromyalgia   . History of shingles   . [name] (irritable bowel syndrome)    takes OTC Hardin Negus colon health  . Joint pain   . Joint swelling   . Myalgia and myositis, unspecified   . Obstructive sleep apnea (adult) (pediatric)   . Old myocardial infarction 1996  . Osteoarthritis   . Osteoarthrosis, unspecified whether generalized or localized, unspecified site   . Psoriatic arthritis ([place])   . Rheumatoid arthritis ([place])   . Rosacea   . Seasonal allergies   . Stroke [facility])    ischaemic microvascular disease  . 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 Unspecified essential hypertension    takes Diltiazem,Metoprolol,and Losartan daily  . UTI (lower urinary tract infection)    frequent=last one was winter of 2014   Past Surgical History:  Procedure  Laterality Date  . ATHERECTOMY  1996  . [place]   vasospams; no blockage  . CARDIAC CATHETERIZATION  2013   No obstructive coronary artery disease. Mild distal LAD and proximal RCA disease.  [name] CATARACT EXTRACTION Bilateral   . COLONOSCOPY    . CT ABD WO/W & PELVIS WO CM  2013   no acute process, + minimal diverticulosis and hepatic/renal cysts, ATH calcifications  . ETHMOIDECTOMY  1998  . 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 NASAL SINUS SURGERY  1998  . OOPHORECTOMY     BSO  . sinus biopsy  1998   inverted papiloma  . VAGINAL HYSTERECTOMY  1992   for mennorhagia-LAVH BSO   Family History  Problem Relation Age of Onset  . Colon cancer Father   . Heart attack Mother   . Coronary artery disease Mother   . Osteoporosis Mother   . Heart disease Mother   . Heart failure Mother   . GER disease Sister   . Hypertension Sister   . Osteopenia Sister   . GER disease Sister   . Breast cancer  Paternal Aunt        age 43  . Heart failure Maternal Grandmother   . Heart failure Maternal Grandfather    History  Sexual Activity  . Sexual activity: Not Currently  . Birth control/ protection: Surgical    Comment: 1st intercourse 24 yo-2 partners    Outpatient Encounter Prescriptions as of 02/19/2017  Medication Sig  . acetaminophen (TYLENOL) 325 MG tablet Take 325 mg by mouth every 8 (eight) hours as needed (pain).   [name] aspirin EC 325 MG tablet Take 1 tablet (325 mg total) by mouth daily.  . Calcium Carbonate-Vitamin D (CALTRATE 600+D PO) Take 1 tablet by mouth 2 (two) times daily.   . Coenzyme Q10 200 MG capsule Take 200 mg by mouth daily.    [name] diltiazem (CARDIZEM CD) 240 MG 24 hr capsule Take 1 capsule (240 mg total) by mouth daily.  . Doxylamine Succinate, Sleep, (UNISOM) 25 MG tablet Take 12.5 mg by mouth at  bedtime as needed for sleep.   [name] econazole nitrate 1 % cream Apply 1 application topically 2 (two) times daily.  [name] losartan-hydrochlorothiazide (HYZAAR) 100-12.5 MG tablet Take 1 tablet by mouth daily.  . Multiple Vitamin (MULTIVITAMIN) tablet Take 1 tablet by mouth daily.    . nitroGLYCERIN (NITROSTAT) 0.4 MG SL tablet Place 1 tablet (0.4 mg total) under the tongue every 5 (five) minutes as needed for chest pain.  . Omega-3 Fatty Acids (FISH OIL) 1200 MG CAPS Take 1 capsule by mouth 2 (two) times daily.   Vladimir Faster Glycol-Propyl Glycol (SYSTANE) 0.4-0.3 % SOLN Apply 1 drop to eye daily as needed (dry eyes).   . Probiotic Product ([place]) CAPS Take 1 capsule by mouth daily.  . traMADol (ULTRAM) 50 MG tablet TAKE ONE TABLET BY MOUTH EVERY 8 HOURS AS NEEDED  . triamcinolone cream (KENALOG) 0.1 % Apply 1 application topically 2 (two) times daily as needed.   . vitamin B-12 (CYANOCOBALAMIN) 1000 MCG tablet Take 1,000 mcg by mouth daily.  . benzonatate (TESSALON) 200 MG capsule Take 1 capsule (200 mg total) by mouth 2 (two) times daily as needed for cough. (Patient not taking: Reported on 01/04/2017)  . Ginger, Zingiber officinalis, (GINGER ROOT) 250 MG CAPS Take 1 capsule by mouth daily.  . Magnesium 250 MG TABS Take 250 mg by mouth daily.   [name] OVER THE COUNTER MEDICATION Curcumin-One tablet by mouth daily  . spironolactone (ALDACTONE) 25 MG tablet Take 1 tablet (25 mg total) by mouth daily. (Patient not taking: Reported on 02/19/2017)  . TURMERIC PO Take 1 tablet by mouth daily.  . [DISCONTINUED] mupirocin ointment (BACTROBAN) 2 % Apply 1 application topically 2 (two) times daily. (Patient not taking: Reported on 02/19/2017)  . [DISCONTINUED] neomycin-polymyxin-dexameth (MAXITROL) 0.1 % OINT Place 1 application into both eyes at bedtime.  . [DISCONTINUED] OVER THE COUNTER MEDICATION Take 1 capsule by mouth daily. [place] herbal supplement   No facility-administered encounter medications  on file as of 02/19/2017.     Activities of Daily Living In your present state of health, do you have any difficulty performing the following activities: 02/19/2017  Hearing? N  Vision? N  Difficulty concentrating or making decisions? Y  Walking or climbing stairs? Y  Dressing or bathing? N  Doing errands, shopping? Y  Preparing Food and eating ? N  Using the Toilet? N  In the past six months, have you accidently leaked urine? N  Do you have problems with loss of bowel control? N  Managing your Medications? N  Managing your Finances? N  Housekeeping or managing your Housekeeping? Y  Some recent data might be hidden    Patient Care Team: Jinny Sanders, MD as PCP - General Wellington Hampshire, MD as Consulting Physician (Cardiology) Birder Robson, MD as Referring Physician (Ophthalmology) Oneta Rack, MD as Consulting Physician (Dermatology) Valinda Party, MD as Consulting Physician (Rheumatology) Vicenta Aly, DC (Chiropractic Medicine)    Assessment:    Physical assessment deferred to PCP.  Exercise Activities and Dietary recommendations Current Exercise Habits: Home exercise routine, Type of exercise: walking, Time (Minutes): 30, Frequency (Times/Week): 7, Weekly Exercise (Minutes/Week): 210, Intensity: Mild  Goals    . Increase physical activity          Starting 02/19/2017, I will start doing floor exercises daily.       Fall Risk Fall Risk  02/19/2017 01/28/2016 01/14/2015 10/17/2013 08/13/2012  Falls in the past year? No No No Yes Yes  Number falls in past yr: - - - 1 2 or more  Injury with Fall? - - - Yes -  Risk Factor Category  - - - - High Fall Risk  Risk for fall due to : - - - History of fall(s) Other (Comment)  Risk for fall due to (comments): - - - - thinks BP changes   Depression Screen PHQ 2/9 Scores 02/19/2017 01/28/2016 01/14/2015 10/17/2013  PHQ - 2 Score 0 0 0 0     Cognitive Function     Mini-Cog - 02/19/17 1000    Normal clock drawing test?  yes   How many words correct? 0      PLEASE NOTE: A Mini-Cog screen was completed. Maximum score is 20. A value of 0 denotes this part of [place] MMSE was not completed or the patient failed this part of the Mini-Cog screening.   Mini-Cog Screening Orientation to Time - Max 5 pts Orientation to Place - Max 5 pts Registration - Max 3 pts Recall - Max 3 pts Language Repeat - Max 1 pts Language Follow 3 Step Command - Max 3 pts  MMSE - Mini Mental State Exam 02/19/2017 01/28/2016  Orientation to time 3 5  Orientation to time comments Does not know date or day of the week. -  Orientation to Place 5 5  Registration 3 3  Attention/ Calculation 0 0  Recall 0 0  Recall-comments - pt was unable to recall 3 of 3 words  Language- name 2 objects 0 0  Language- repeat 1 1  Language- follow 3 step command 3 3  Language- read & follow direction 0 0  Write a sentence 0 0  Copy design 0 0  Total score 15 17        Immunization History  Administered Date(s) Administered  . Influenza Split 05/16/2012  . Influenza Whole 05/15/2007, 05/20/2008, 05/14/2009, 05/14/2010  . Influenza,inj,Quad PF,36+ Mos 05/13/2016  . Influenza-Unspecified 05/14/2013, 06/06/2014  . Pneumococcal Conjugate-13 10/17/2013  . Pneumococcal Polysaccharide-23 08/14/2005, 02/19/2017  . Td 08/14/2002  . Zoster 08/14/2005, 05/18/2008   Screening Tests Health Maintenance  Topic Date Due  . INFLUENZA VACCINE  03/14/2017  . TETANUS/TDAP  05/28/2025  . DEXA SCAN  Completed  . PNA vac Low Risk Adult  Completed      Plan:    Follow-up w/ PCP as scheduled.   Bring a copy of your advance directives to your next office visit.  PPSV-23 given today.  I have personally reviewed and noted the  following in the patient's chart:   . Medical and social history . Use of alcohol, tobacco or illicit drugs  . Current medications and supplements . Functional ability and status . Nutritional status . Physical  activity . Advanced directives . List of other physicians . Vitals . Screenings to include cognitive, depression, and falls . Referrals and appointments  In addition, I have reviewed and discussed with patient certain preventive protocols, quality metrics, and best practice recommendations. A written personalized care plan for preventive services as well as general preventive health recommendations were provided to patient.     Dorrene German, RN  02/19/2017

## 2017-02-12 NOTE — Progress Notes (Signed)
PCP notes:   Health maintenance:  MMG- last 04/30/15. Patient states she discussed w/ Gyn that she would stop doing yearly screenings. DEXA- last 03/30/12 PPSV-23 given today.  Abnormal screenings: Hearing-failed Cognitive- Mini-Cog score was 2/5. Unable to recall 3 words.    Patient concerns:  Dizzy spells x6 months that seem to be worsening.  Dr. Dossie Der is considering starting her on methotrexate for RA. She is requesting PCP approval. See last office notes under "Media" tab.  Suspicious mole of L face-pt would like PCP to assess at next visit  OTC meds: She would like to stop taking turmeric, curcumin, ginger, and magnesium supplements.  Nurse concerns: Fall risk-She lives in a 2 story house and has to "scoot" up and down the basement stairs.  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 See notes in Epic-no anticoagulation recommended.  Next PCP appt: 02/23/17 @ 11:15am

## 2017-02-19 ENCOUNTER — Ambulatory Visit (INDEPENDENT_AMBULATORY_CARE_PROVIDER_SITE_OTHER): Payer: PPO

## 2017-02-19 ENCOUNTER — Other Ambulatory Visit (INDEPENDENT_AMBULATORY_CARE_PROVIDER_SITE_OTHER): Payer: PPO

## 2017-02-19 VITALS — BP 142/70 | HR 63 | Resp 16 | Ht 64.0 in | Wt 140.0 lb

## 2017-02-19 DIAGNOSIS — Z0000 Encounter for general adult medical examination without abnormal findings: Secondary | ICD-10-CM

## 2017-02-19 DIAGNOSIS — Z23 Encounter for immunization: Secondary | ICD-10-CM | POA: Diagnosis not present

## 2017-02-19 LAB — CBC WITH DIFFERENTIAL/PLATELET
BASOS PCT: 2.3 % (ref 0.0–3.0)
Basophils Absolute: 0.2 10*3/uL — ABNORMAL HIGH (ref 0.0–0.1)
EOS ABS: 0.6 10*3/uL (ref 0.0–0.7)
EOS PCT: 6.6 % — AB (ref 0.0–5.0)
HEMATOCRIT: 35.6 % — AB (ref 36.0–46.0)
HEMOGLOBIN: 11.8 g/dL — AB (ref 12.0–15.0)
LYMPHS PCT: 18.3 % (ref 12.0–46.0)
Lymphs Abs: 1.5 10*3/uL (ref 0.7–4.0)
MCHC: 33.1 g/dL (ref 30.0–36.0)
MCV: 98.8 fl (ref 78.0–100.0)
Monocytes Absolute: 0.6 10*3/uL (ref 0.1–1.0)
Monocytes Relative: 6.6 % (ref 3.0–12.0)
Neutro Abs: 5.5 10*3/uL (ref 1.4–7.7)
Neutrophils Relative %: 66.2 % (ref 43.0–77.0)
Platelets: 351 10*3/uL (ref 150.0–400.0)
RBC: 3.6 Mil/uL — AB (ref 3.87–5.11)
RDW: 14.3 % (ref 11.5–15.5)
WBC: 8.4 10*3/uL (ref 4.0–10.5)

## 2017-02-19 LAB — COMPREHENSIVE METABOLIC PANEL
ALBUMIN: 4 g/dL (ref 3.5–5.2)
ALK PHOS: 59 U/L (ref 39–117)
ALT: 11 U/L (ref 0–35)
AST: 16 U/L (ref 0–37)
BILIRUBIN TOTAL: 0.4 mg/dL (ref 0.2–1.2)
BUN: 18 mg/dL (ref 6–23)
CALCIUM: 9.6 mg/dL (ref 8.4–10.5)
CHLORIDE: 104 meq/L (ref 96–112)
CO2: 26 mEq/L (ref 19–32)
CREATININE: 1.01 mg/dL (ref 0.40–1.20)
GFR: 56.58 mL/min — ABNORMAL LOW (ref >60.00)
Glucose, Bld: 92 mg/dL (ref 70–99)
Potassium: 3.5 mEq/L (ref 3.5–5.1)
Sodium: 138 mEq/L (ref 135–145)
TOTAL PROTEIN: 7.3 g/dL (ref 6.0–8.3)

## 2017-02-19 LAB — LIPID PANEL
CHOLESTEROL: 217 mg/dL — AB (ref 0–200)
HDL: 58.7 mg/dL (ref >39.00)
LDL CALC: 128 mg/dL — AB (ref 0–99)
NonHDL: 158.78
TRIGLYCERIDES: 156 mg/dL — AB (ref 0.0–149.0)
Total CHOL/HDL Ratio: 4
VLDL: 31.2 mg/dL (ref 0.0–40.0)

## 2017-02-19 LAB — TSH: TSH: 2.44 u[IU]/mL (ref 0.35–4.50)

## 2017-02-19 NOTE — Patient Instructions (Signed)
Kristin [name] , Thank you for taking time to come for your Medicare Wellness Visit. I appreciate your ongoing commitment to your health goals. Please review the following plan we discussed and let me know if I can assist you in the future.   These are the goals we discussed: Goals    . Increase physical activity          Starting 02/19/2017, I will start doing floor exercises daily.        This is a list of the screening recommended for you and due dates:  Health Maintenance  Topic Date Due  . Pneumonia vaccines (2 of 2 - PPSV23) 10/18/2014  . Flu Shot  03/14/2017  . Tetanus Vaccine  05/28/2025  . DEXA scan (bone density measurement)  Completed   Preventive Care for Adults  A healthy lifestyle and preventive care can promote health and wellness. Preventive health guidelines for adults include the following key practices.  . A routine yearly physical is a good way to check with your health care provider about your health and preventive screening. It is a chance to share any concerns and updates on your health and to receive a thorough exam.  . Visit your dentist for a routine exam and preventive care every 6 months. Brush your teeth twice a day and floss once a day. Good oral hygiene prevents tooth decay and gum disease.  . The frequency of eye exams is based on your age, health, family medical history, use  of contact lenses, and other factors. Follow your health care provider's ecommendations for frequency of eye exams.  . Eat a healthy diet. Foods like vegetables, fruits, whole grains, low-fat dairy products, and lean protein foods contain the nutrients you need without too many calories. Decrease your intake of foods high in solid fats, added sugars, and salt. Eat the right amount of calories for you. Get information about a proper diet from your health care provider, if necessary.  . Regular physical exercise is one of the most important things you can do for your health. Most adults  should get at least 150 minutes of moderate-intensity exercise (any activity that increases your heart rate and causes you to sweat) each week. In addition, most adults need muscle-strengthening exercises on 2 or more days a week.  Silver Sneakers may be a benefit available to you. To determine eligibility, you may visit the website: www.silversneakers.com or contact program at (262) 444-3799 Mon-Fri between 8AM-8PM.   . Maintain a healthy weight. The body mass index (BMI) is a screening tool to identify possible weight problems. It provides an estimate of body fat based on height and weight. Your health care provider can find your BMI and can help you achieve or maintain a healthy weight.   For adults 20 years and older: ? A BMI below 18.5 is considered underweight. ? A BMI of 18.5 to 24.9 is normal. ? A BMI of 25 to 29.9 is considered overweight. ? A BMI of 30 and above is considered obese.   . Maintain normal blood lipids and cholesterol levels by exercising and minimizing your intake of saturated fat. Eat a balanced diet with plenty of fruit and vegetables. Blood tests for lipids and cholesterol should begin at age 55 and be repeated every 5 years. If your lipid or cholesterol levels are high, you are over 50, or you are at high risk for heart disease, you may need your cholesterol levels checked more frequently. Ongoing high lipid and cholesterol levels  should be treated with medicines if diet and exercise are not working.  . If you smoke, find out from your health care provider how to quit. If you do not use tobacco, please do not start.  . If you choose to drink alcohol, please do not consume more than 2 drinks per day. One drink is considered to be 12 ounces (355 mL) of beer, 5 ounces (148 mL) of wine, or 1.5 ounces (44 mL) of liquor.  . If you are 5-40 years old, ask your health care provider if you should take aspirin to prevent strokes.  . Use sunscreen. Apply sunscreen liberally and  repeatedly throughout the day. You should seek shade when your shadow is shorter than you. Protect yourself by wearing long sleeves, pants, a wide-brimmed hat, and sunglasses year round, whenever you are outdoors.  . Once a month, do a whole body skin exam, using a mirror to look at the skin on your back. Tell your health care provider of new moles, moles that have irregular borders, moles that are larger than a pencil eraser, or moles that have changed in shape or color.

## 2017-02-19 NOTE — Progress Notes (Signed)
I reviewed health advisor's note, was available for consultation, and agree with documentation and plan.  

## 2017-02-19 NOTE — Addendum Note (Signed)
Addended by: Ellamae Sia on: 02/19/2017 02:13 PM   Modules accepted: Orders

## 2017-02-23 ENCOUNTER — Ambulatory Visit (INDEPENDENT_AMBULATORY_CARE_PROVIDER_SITE_OTHER): Payer: PPO | Admitting: Family Medicine

## 2017-02-23 ENCOUNTER — Encounter: Payer: Self-pay | Admitting: Family Medicine

## 2017-02-23 VITALS — BP 138/60 | HR 67 | Temp 98.3°F | Ht 64.0 in | Wt 140.2 lb

## 2017-02-23 DIAGNOSIS — I10 Essential (primary) hypertension: Secondary | ICD-10-CM | POA: Diagnosis not present

## 2017-02-23 DIAGNOSIS — E782 Mixed hyperlipidemia: Secondary | ICD-10-CM

## 2017-02-23 DIAGNOSIS — I824Z3 Acute embolism and thrombosis of unspecified deep veins of distal lower extremity, bilateral: Secondary | ICD-10-CM | POA: Diagnosis not present

## 2017-02-23 DIAGNOSIS — Z0001 Encounter for general adult medical examination with abnormal findings: Secondary | ICD-10-CM | POA: Diagnosis not present

## 2017-02-23 DIAGNOSIS — E785 Hyperlipidemia, unspecified: Secondary | ICD-10-CM | POA: Diagnosis not present

## 2017-02-23 DIAGNOSIS — Z0000 Encounter for general adult medical examination without abnormal findings: Secondary | ICD-10-CM

## 2017-02-23 NOTE — Assessment & Plan Note (Signed)
Well controlled. Continue current medication.  

## 2017-02-23 NOTE — Assessment & Plan Note (Signed)
Given distal DVT with minimal symptoms.. She is anticoagulated with ASA. Needs 2 week follow up re-eval for  Propagation. Etiology: immobilization on long car/plane trip

## 2017-02-23 NOTE — Patient Instructions (Addendum)
See dermatology about mole changing on left face.  Please stop at the front desk to set up referral for doppler repeat.  Continue aspirin for now. Work on low cholesterol diet and exercise as tolerated.

## 2017-02-23 NOTE — Assessment & Plan Note (Signed)
Inadeqaute control butn ot interested in statin. Encouraged exercise, weight loss, healthy eating habits.

## 2017-02-23 NOTE — Progress Notes (Signed)
Subjective:    Patient ID: Kristin [name], female    DOB: 1941/01/30, 76 y.o.   MRN: [id number]  HPI  The patient presents for complete physical and review of chronic health problems. He/She also has the following acute concerns today: follow up DVT  The patient saw [facility] for medicare wellness. Note reviewed in detail and important notes copied below.  Health maintenance:  MMG- last 04/30/15. Patient states she discussed w/ Gyn that she would stop doing yearly screenings. DEXA- last 03/30/12 PPSV-23 given today.  Abnormal screenings: Hearing-failed Cognitive- Mini-Cog score was 2/5. Unable to recall 3 words.    Patient concerns:  Dizzy spells x6 months that seem to be worsening.  Dr. Dossie Der is considering starting her on methotrexate for RA. She is requesting PCP approval. See last office notes under "Media" tab.  Suspicious mole of L face-pt would like PCP to assess at next visit  OTC meds: She would like to stop taking turmeric, curcumin, ginger, and magnesium supplements.  Nurse concerns: Fall risk-She lives in a 2 story house and has to "scoot" up and down the basement stairs.  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 See notes in Epic-no anticoagulation recommended.    TODAY: recent ED isit 02/08/2017 reviewed.  Found to have bilateral distal DVT. Recommended ASA for anticoag. Given symptoms were minor. Currently swelling , no pain.  trigger was trip to plane trip and bus tour to [place]. Needs to have repeat dopplers for propagation 1-2 week after initial.  Hypertension:   Good control on dilitazem, losartan HCTZ and spironolactone  HX of CVA Using medication without problems or lightheadedness:  none Chest pain with exertion: none Edema: none Short of breath: none Average home BPs: good control. Other issues:   Elevated Cholesterol:  Not at goal.. Refuses any chol med. Lab Results  Component Value Date   CHOL 217 (H) 02/19/2017   HDL  58.70 02/19/2017   LDLCALC 128 (H) 02/19/2017   LDLDIRECT 158.1 10/02/2013   TRIG 156.0 (H) 02/19/2017   CHOLHDL 4 02/19/2017   Diet compliance: good Exercise: none Other complaints:  Fibromyalgia/inflammatory arthritis: Per rheumatology Dr. Titus Mould, Good control on tramadol 50 mg daily, does make her somewhat sleepy. MD recommended Enbrel.. She decided against it. She has chronic fatigue. No insomnia.     Social History /Family History/Past Medical History reviewed in detail and updated in EMR if needed. Blood pressure 138/60, pulse 67, temperature 98.3 F (36.8 C), temperature source Oral, height 5\' 4"  (1.626 m), weight 140 lb 4 oz (63.6 kg), last menstrual period 08/14/1989.  Review of Systems  Constitutional: Positive for fatigue. Negative for fever.  HENT: Negative for congestion.   Eyes: Negative for pain.  Respiratory: Negative for cough and shortness of breath.   Cardiovascular: Negative for chest pain, palpitations and leg swelling.  Gastrointestinal: Negative for abdominal pain.  Genitourinary: Negative for dysuria and vaginal bleeding.  Musculoskeletal: Negative for back pain.  Neurological: Positive for dizziness. Negative for syncope, light-headedness and headaches.  Psychiatric/Behavioral: Negative for dysphoric mood.       Objective:   Physical Exam  Constitutional: Vital signs are normal. She appears well-developed and well-nourished. She is cooperative.  Non-toxic appearance. She does not appear ill. No distress.  HENT:  Head: Normocephalic.  Right Ear: Hearing, tympanic membrane, external ear and ear canal normal.  Left Ear: Hearing, tympanic membrane, external ear and ear canal normal.  Nose: Nose normal.  Eyes: Pupils are equal, round, and reactive  to light. Conjunctivae, EOM and lids are normal. Lids are everted and swept, no foreign bodies found.  Neck: Trachea normal and normal range of motion. Neck supple. Carotid bruit is not present. No thyroid  mass and no thyromegaly present.  Cardiovascular: Normal rate, regular rhythm, S1 normal, S2 normal, normal heart sounds and intact distal pulses.  Exam reveals no gallop.   No murmur heard. Bilateral lower ext edema, minimal pain at ankles  Pulmonary/Chest: Effort normal and breath sounds normal. No respiratory distress. She has no wheezes. She has no rhonchi. She has no rales.  Abdominal: Soft. Normal appearance and bowel sounds are normal. She exhibits no distension, no fluid wave, no abdominal bruit and no mass. There is no hepatosplenomegaly. There is no tenderness. There is no rebound, no guarding and no CVA tenderness. No hernia.  Lymphadenopathy:    She has no cervical adenopathy.    She has no axillary adenopathy.  Neurological: She is alert. She has normal strength. No cranial nerve deficit or sensory deficit.  Skin: Skin is warm, dry and intact. No rash noted.  Psychiatric: Her speech is normal and behavior is normal. Judgment normal. Her mood appears not anxious. Cognition and memory are normal. She does not exhibit a depressed mood.          Assessment & Plan:  The patient's preventative maintenance and recommended screening tests for an annual wellness exam were reviewed in full today. Brought up to date unless services declined.  Counselled on the importance of diet, exercise, and its role in overall health and mortality. The patient's FH and SH was reviewed, including their home life, tobacco status, and drug and alcohol status.   Vaccines: uptodate Mammogram: 04/2015 nml , discussed stopping.. No further DVE/pap: not indicated, given age and S/P vaginal hysterectomy for noncancer Colon: 2011.. Nml, likely no repeat needed.  DEXA: 03/2012, nml repeat in 2018 Nonsmoker

## 2017-03-02 ENCOUNTER — Ambulatory Visit
Admission: RE | Admit: 2017-03-02 | Discharge: 2017-03-02 | Disposition: A | Discharge status: Home / Self Care | Payer: PPO | Source: Ambulatory Visit | Attending: Family Medicine | Admitting: Family Medicine

## 2017-03-02 DIAGNOSIS — I824Z3 Acute embolism and thrombosis of unspecified deep veins of distal lower extremity, bilateral: Secondary | ICD-10-CM | POA: Insufficient documentation

## 2017-03-02 DIAGNOSIS — I82439 Acute embolism and thrombosis of unspecified popliteal vein: Secondary | ICD-10-CM | POA: Diagnosis not present

## 2017-03-12 ENCOUNTER — Ambulatory Visit: Payer: PPO | Admitting: Podiatry

## 2017-03-13 ENCOUNTER — Other Ambulatory Visit: Payer: Self-pay | Admitting: Cardiovascular Disease

## 2017-03-22 ENCOUNTER — Other Ambulatory Visit: Payer: Self-pay | Admitting: Family Medicine

## 2017-03-22 NOTE — Telephone Encounter (Signed)
Rx called in to pharmacy. 

## 2017-03-22 NOTE — Telephone Encounter (Signed)
Tramadol called into [facility],  - [address] Phone: [phone]

## 2017-03-22 NOTE — Telephone Encounter (Signed)
Last office visit 02/23/2017.  Last refilled 11/24/2016 for #90 with no refills.  Ok to refill?

## 2017-03-26 ENCOUNTER — Ambulatory Visit (INDEPENDENT_AMBULATORY_CARE_PROVIDER_SITE_OTHER): Payer: PPO | Admitting: Podiatry

## 2017-03-26 DIAGNOSIS — M2041 Other hammer toe(s) (acquired), right foot: Secondary | ICD-10-CM

## 2017-03-26 DIAGNOSIS — M79609 Pain in unspecified limb: Secondary | ICD-10-CM | POA: Diagnosis not present

## 2017-03-26 DIAGNOSIS — B351 Tinea unguium: Secondary | ICD-10-CM | POA: Diagnosis not present

## 2017-03-26 DIAGNOSIS — M205X9 Other deformities of toe(s) (acquired), unspecified foot: Secondary | ICD-10-CM

## 2017-03-26 DIAGNOSIS — M2042 Other hammer toe(s) (acquired), left foot: Secondary | ICD-10-CM

## 2017-03-26 NOTE — Progress Notes (Signed)
Complaint:  Visit Type: Patient returns to my office for continued preventative foot care services. Complaint: Patient states" my nails have grown long and thick and become painful to walk and wear shoes" . The patient presents for preventative foot care services. No changes to ROS.  Patient last seen in January 1018.  Podiatric Exam: Vascular: dorsalis pedis and posterior tibial pulses are palpable bilateral. Capillary return is immediate. Temperature gradient is WNL. Skin turgor WNL  Sensorium: Normal Semmes Weinstein monofilament test. Normal tactile sensation bilaterally. Nail Exam: Pt has thick disfigured discolored nails with subungual debris noted bilateral entire nail hallux through fifth toenails Ulcer Exam: There is no evidence of ulcer or pre-ulcerative changes or infection. Orthopedic Exam: Muscle tone and strength are WNL. No limitations in general ROM. No crepitus or effusions noted. Hallux limitus 1st MPJ  B/L.  Hammer toe second  B/L Skin: No Porokeratosis. No infection or ulcers  Diagnosis:  Onychomycosis, , Pain in right toe, pain in left toes  Treatment & Plan Procedures and Treatment: Consent by patient was obtained for treatment procedures. The patient understood the discussion of treatment and procedures well. All questions were answered thoroughly reviewed. Debridement of mycotic and hypertrophic toenails, 1 through 5 bilateral and clearing of subungual debris. No ulceration, no infection noted. Padding second toe  B/L. Return Visit-Office Procedure: Patient instructed to return to the office for a follow up visit 4 months for continued evaluation and treatment.    Gardiner Barefoot DPM

## 2017-03-30 DIAGNOSIS — M779 Enthesopathy, unspecified: Secondary | ICD-10-CM | POA: Diagnosis not present

## 2017-03-30 DIAGNOSIS — M5440 Lumbago with sciatica, unspecified side: Secondary | ICD-10-CM | POA: Diagnosis not present

## 2017-03-30 DIAGNOSIS — M797 Fibromyalgia: Secondary | ICD-10-CM | POA: Diagnosis not present

## 2017-03-30 DIAGNOSIS — L4050 Arthropathic psoriasis, unspecified: Secondary | ICD-10-CM | POA: Diagnosis not present

## 2017-03-30 DIAGNOSIS — M1990 Unspecified osteoarthritis, unspecified site: Secondary | ICD-10-CM | POA: Diagnosis not present

## 2017-04-05 ENCOUNTER — Encounter: Payer: Self-pay | Admitting: Family Medicine

## 2017-04-05 ENCOUNTER — Ambulatory Visit (INDEPENDENT_AMBULATORY_CARE_PROVIDER_SITE_OTHER): Payer: PPO | Admitting: Family Medicine

## 2017-04-05 VITALS — BP 140/60 | HR 74 | Temp 98.3°F | Ht 64.0 in | Wt 139.5 lb

## 2017-04-05 DIAGNOSIS — N3001 Acute cystitis with hematuria: Secondary | ICD-10-CM | POA: Diagnosis not present

## 2017-04-05 DIAGNOSIS — R350 Frequency of micturition: Secondary | ICD-10-CM | POA: Diagnosis not present

## 2017-04-05 LAB — POCT UA - MICROSCOPIC ONLY

## 2017-04-05 LAB — POC URINALSYSI DIPSTICK (AUTOMATED)
Bilirubin, UA: NEGATIVE
Glucose, UA: NEGATIVE
Ketones, UA: NEGATIVE
Nitrite, UA: NEGATIVE
Spec Grav, UA: 1.02
Urobilinogen, UA: 0.2 U/dL
pH, UA: 6

## 2017-04-05 MED ORDER — SULFAMETHOXAZOLE-TRIMETHOPRIM 800-160 MG PO TABS: 1.0000 | ORAL_TABLET | Freq: Two times a day (BID) | ORAL | 0 refills | Status: DC

## 2017-04-05 NOTE — Progress Notes (Signed)
   Subjective:    Patient ID: Kristin [name], female    DOB: 05/31/1941, 76 y.o.   MRN: [id number]  Urinary Frequency   This is a new problem. The current episode started in the past 7 days ( 4 days ago). The problem occurs every urination. The problem has been gradually worsening. The quality of the pain is described as burning. The pain is moderate. There has been no fever. She is not sexually active. Associated symptoms include frequency. Pertinent negatives include no chills, discharge, flank pain, hematuria, nausea, urgency or vomiting. She has tried increased fluids for the symptoms. The treatment provided mild relief. There is no history of catheterization, kidney stones, recurrent UTIs, a single kidney, urinary stasis or a urological procedure.   Urine dipstick shows negative for all components, positive for leukocytes, red blood cells.   Dr. Titus Mould plans Enbrel on rheumatoid arthritis.  Blood pressure 140/60, pulse 74, temperature 98.3 F (36.8 C), temperature source Oral, height 5\' 4"  (1.626 m), weight 139 lb 8 oz (63.3 kg), last menstrual period 08/14/1989.  Review of Systems  Constitutional: Negative for chills.  Gastrointestinal: Negative for nausea and vomiting.  Genitourinary: Positive for frequency. Negative for flank pain, hematuria and urgency.       Objective:   Physical Exam  Constitutional: Vital signs are normal. She appears well-developed and well-nourished. She is cooperative.  Non-toxic appearance. She does not appear ill. No distress.  HENT:  Head: Normocephalic.  Right Ear: Hearing, tympanic membrane, external ear and ear canal normal. Tympanic membrane is not erythematous, not retracted and not bulging.  Left Ear: Hearing, tympanic membrane, external ear and ear canal normal. Tympanic membrane is not erythematous, not retracted and not bulging.  Nose: No mucosal edema or rhinorrhea. Right sinus exhibits no maxillary sinus tenderness and no frontal sinus  tenderness. Left sinus exhibits no maxillary sinus tenderness and no frontal sinus tenderness.  Mouth/Throat: Uvula is midline, oropharynx is clear and moist and mucous membranes are normal.  Eyes: Pupils are equal, round, and reactive to light. Conjunctivae, EOM and lids are normal. Lids are everted and swept, no foreign bodies found.  Neck: Trachea normal and normal range of motion. Neck supple. Carotid bruit is not present. No thyroid mass and no thyromegaly present.  Cardiovascular: Normal rate, regular rhythm, S1 normal, S2 normal, normal heart sounds, intact distal pulses and normal pulses.  Exam reveals no gallop and no friction rub.   No murmur heard. Pulmonary/Chest: Effort normal and breath sounds normal. No tachypnea. No respiratory distress. She has no decreased breath sounds. She has no wheezes. She has no rhonchi. She has no rales.  Abdominal: Soft. Normal appearance and bowel sounds are normal. There is no tenderness. There is no CVA tenderness.  Neurological: She is alert.  Skin: Skin is warm, dry and intact. No rash noted.  Psychiatric: Her speech is normal and behavior is normal. Judgment and thought content normal. Her mood appears not anxious. Cognition and memory are normal. She does not exhibit a depressed mood.          Assessment & Plan:

## 2017-04-05 NOTE — Patient Instructions (Signed)
Push fluids.  Complete 3 days of antibiotics. Call if fever, vomiting or not improving at end of treatment course.   Urinary Tract Infection, Adult A urinary tract infection (UTI) is an infection of any part of the urinary tract, which includes the kidneys, ureters, bladder, and urethra. These organs make, store, and get rid of urine in the body. UTI can be a bladder infection (cystitis) or kidney infection (pyelonephritis). What are the causes? This infection may be caused by fungi, viruses, or bacteria. Bacteria are the most common cause of UTIs. This condition can also be caused by repeated incomplete emptying of the bladder during urination. What increases the risk? This condition is more likely to develop if:  You ignore your need to urinate or hold urine for long periods of time.  You do not empty your bladder completely during urination.  You wipe back to front after urinating or having a bowel movement, if you are female.  You are uncircumcised, if you are female.  You are constipated.  You have a urinary catheter that stays in place (indwelling).  You have a weak defense (immune) system.  You have a medical condition that affects your bowels, kidneys, or bladder.  You have diabetes.  You take antibiotic medicines frequently or for long periods of time, and the antibiotics no longer work well against certain types of infections (antibiotic resistance).  You take medicines that irritate your urinary tract.  You are exposed to chemicals that irritate your urinary tract.  You are female.  What are the signs or symptoms? Symptoms of this condition include:  Fever.  Frequent urination or passing small amounts of urine frequently.  Needing to urinate urgently.  Pain or burning with urination.  Urine that smells bad or unusual.  Cloudy urine.  Pain in the lower abdomen or back.  Trouble urinating.  Blood in the urine.  Vomiting or being less hungry than  normal.  Diarrhea or abdominal pain.  Vaginal discharge, if you are female.  How is this diagnosed? This condition is diagnosed with a medical history and physical exam. You will also need to provide a urine sample to test your urine. Other tests may be done, including:  Blood tests.  Sexually transmitted disease (STD) testing.  If you have had more than one UTI, a cystoscopy or imaging studies may be done to determine the cause of the infections. How is this treated? Treatment for this condition often includes a combination of two or more of the following:  Antibiotic medicine.  Other medicines to treat less common causes of UTI.  Over-the-counter medicines to treat pain.  Drinking enough water to stay hydrated.  Follow these instructions at home:  Take over-the-counter and prescription medicines only as told by your health care provider.  If you were prescribed an antibiotic, take it as told by your health care provider. Do not stop taking the antibiotic even if you start to feel better.  Avoid alcohol, caffeine, tea, and carbonated beverages. They can irritate your bladder.  Drink enough fluid to keep your urine clear or pale yellow.  Keep all follow-up visits as told by your health care provider. This is important.  Make sure to: ? Empty your bladder often and completely. Do not hold urine for long periods of time. ? Empty your bladder before and after sex. ? Wipe from front to back after a bowel movement if you are female. Use each tissue one time when you wipe. Contact a health care provider  if:  You have back pain.  You have a fever.  You feel nauseous or vomit.  Your symptoms do not get better after 3 days.  Your symptoms go away and then return. Get help right away if:  You have severe back pain or lower abdominal pain.  You are vomiting and cannot keep down any medicines or water. This information is not intended to replace advice given to you by  your health care provider. Make sure you discuss any questions you have with your health care provider. Document Released: 05/10/2005 Document Revised: 01/12/2016 Document Reviewed: 06/21/2015 Elsevier Interactive Patient Education  2017 [employer].

## 2017-04-05 NOTE — Assessment & Plan Note (Signed)
Simple uncomplicated UTi.  Treat with antibiotics and push fluids.  Send urine for culture.

## 2017-04-05 NOTE — Addendum Note (Signed)
Addended by: Carter Kitten on: 04/05/2017 12:10 PM   Modules accepted: Orders

## 2017-04-07 LAB — URINE CULTURE

## 2017-04-13 DIAGNOSIS — L308 Other specified dermatitis: Secondary | ICD-10-CM | POA: Diagnosis not present

## 2017-05-09 DIAGNOSIS — M797 Fibromyalgia: Secondary | ICD-10-CM | POA: Diagnosis not present

## 2017-05-09 DIAGNOSIS — Z79899 Other long term (current) drug therapy: Secondary | ICD-10-CM | POA: Diagnosis not present

## 2017-05-09 DIAGNOSIS — M5440 Lumbago with sciatica, unspecified side: Secondary | ICD-10-CM | POA: Diagnosis not present

## 2017-05-09 DIAGNOSIS — M069 Rheumatoid arthritis, unspecified: Secondary | ICD-10-CM | POA: Diagnosis not present

## 2017-05-09 DIAGNOSIS — M1990 Unspecified osteoarthritis, unspecified site: Secondary | ICD-10-CM | POA: Diagnosis not present

## 2017-05-09 DIAGNOSIS — M25561 Pain in right knee: Secondary | ICD-10-CM | POA: Diagnosis not present

## 2017-05-28 DIAGNOSIS — R918 Other nonspecific abnormal finding of lung field: Secondary | ICD-10-CM | POA: Diagnosis not present

## 2017-06-12 ENCOUNTER — Ambulatory Visit (INDEPENDENT_AMBULATORY_CARE_PROVIDER_SITE_OTHER): Payer: PPO

## 2017-06-12 DIAGNOSIS — Z23 Encounter for immunization: Secondary | ICD-10-CM | POA: Diagnosis not present

## 2017-06-18 ENCOUNTER — Encounter: Payer: Self-pay | Admitting: Emergency Medicine

## 2017-06-18 ENCOUNTER — Ambulatory Visit: Payer: Self-pay | Admitting: *Deleted

## 2017-06-18 ENCOUNTER — Other Ambulatory Visit: Payer: Self-pay

## 2017-06-18 ENCOUNTER — Emergency Department: Payer: PPO

## 2017-06-18 ENCOUNTER — Emergency Department
Admission: EM | Admit: 2017-06-18 | Discharge: 2017-06-18 | Disposition: A | Discharge status: Home / Self Care | Payer: PPO | Attending: Student in an Organized Health Care Education/Training Program | Admitting: Student in an Organized Health Care Education/Training Program

## 2017-06-18 DIAGNOSIS — R413 Other amnesia: Secondary | ICD-10-CM | POA: Diagnosis not present

## 2017-06-18 DIAGNOSIS — Z7982 Long term (current) use of aspirin: Secondary | ICD-10-CM | POA: Diagnosis not present

## 2017-06-18 DIAGNOSIS — Z8673 Personal history of transient ischemic attack (TIA), and cerebral infarction without residual deficits: Secondary | ICD-10-CM | POA: Insufficient documentation

## 2017-06-18 DIAGNOSIS — I252 Old myocardial infarction: Secondary | ICD-10-CM | POA: Insufficient documentation

## 2017-06-18 DIAGNOSIS — Z86718 Personal history of other venous thrombosis and embolism: Secondary | ICD-10-CM | POA: Diagnosis not present

## 2017-06-18 DIAGNOSIS — R42 Dizziness and giddiness: Secondary | ICD-10-CM | POA: Insufficient documentation

## 2017-06-18 DIAGNOSIS — Z79899 Other long term (current) drug therapy: Secondary | ICD-10-CM | POA: Diagnosis not present

## 2017-06-18 DIAGNOSIS — R079 Chest pain, unspecified: Secondary | ICD-10-CM | POA: Insufficient documentation

## 2017-06-18 DIAGNOSIS — I25111 Atherosclerotic heart disease of native coronary artery with angina pectoris with documented spasm: Secondary | ICD-10-CM | POA: Insufficient documentation

## 2017-06-18 DIAGNOSIS — I10 Essential (primary) hypertension: Secondary | ICD-10-CM | POA: Insufficient documentation

## 2017-06-18 DIAGNOSIS — R6889 Other general symptoms and signs: Secondary | ICD-10-CM | POA: Diagnosis not present

## 2017-06-18 DIAGNOSIS — R0789 Other chest pain: Secondary | ICD-10-CM | POA: Diagnosis not present

## 2017-06-18 LAB — URINALYSIS, COMPLETE (UACMP) WITH MICROSCOPIC
Bacteria, UA: NONE SEEN
Bilirubin Urine: NEGATIVE
GLUCOSE, UA: NEGATIVE mg/dL
HGB URINE DIPSTICK: NEGATIVE
KETONES UR: NEGATIVE mg/dL
LEUKOCYTES UA: NEGATIVE
Nitrite: NEGATIVE
PH: 6 (ref 5.0–8.0)
PROTEIN: NEGATIVE mg/dL
Specific Gravity, Urine: 1.009 (ref 1.005–1.030)

## 2017-06-18 LAB — CBC
HCT: 36.9 % (ref 35.0–47.0)
Hemoglobin: 12.4 g/dL (ref 12.0–16.0)
MCH: 32.4 pg (ref 26.0–34.0)
MCHC: 33.6 g/dL (ref 32.0–36.0)
MCV: 96.3 fL (ref 80.0–100.0)
PLATELETS: 280 10*3/uL (ref 150–440)
RBC: 3.84 MIL/uL (ref 3.80–5.20)
RDW: 16.9 % — ABNORMAL HIGH (ref 11.5–14.5)
WBC: 8.4 10*3/uL (ref 3.6–11.0)

## 2017-06-18 LAB — BASIC METABOLIC PANEL
Anion gap: 8 (ref 5–15)
BUN: 30 mg/dL — ABNORMAL HIGH (ref 6–20)
CALCIUM: 9.6 mg/dL (ref 8.9–10.3)
CO2: 25 mmol/L (ref 22–32)
CREATININE: 0.97 mg/dL (ref 0.44–1.00)
Chloride: 104 mmol/L (ref 101–111)
GFR calc non Af Amer: 55 mL/min — ABNORMAL LOW (ref >60)
Glucose, Bld: 97 mg/dL (ref 65–99)
Potassium: 3.4 mmol/L — ABNORMAL LOW (ref 3.5–5.1)
SODIUM: 137 mmol/L (ref 135–145)

## 2017-06-18 LAB — TROPONIN I

## 2017-06-18 LAB — FOLATE: Folate: 41 ng/mL (ref >5.9)

## 2017-06-18 NOTE — ED Notes (Signed)
Pt states CP, LUQ abd pain, and lower back pain at kidneys that began today. Pt states she was driving and forgot where she was going so she turned around and drove home. Pt not stating that she was going to get her hair done. Pt appears alert and oriented. Ambulatory with assistance to bathroom d/t feeling dizzy. Pt had steady gait. Family member with pt. Given warm blanket.

## 2017-06-18 NOTE — Telephone Encounter (Signed)
Patient's husband is calling- he thinks his wife is having a stoke. She is having the same symptoms she had with her past stroke years ago. Dizziness and confusion. This symptoms are currently happening now. Advised husband to call 911 now- he asked if he can drive her- advised him no- to call 911.

## 2017-06-18 NOTE — ED Triage Notes (Signed)
Pt reports non radiating central chest pain for two days with associated SOB, dizziness, lightheadedness and nausea. Pt also reports increasing confusion for two days. Alert and oriented in triage. No focal deficits noted. Pt reports had been taking methotrexate for fibromyalgia.

## 2017-06-18 NOTE — Telephone Encounter (Signed)
Agree with ER dispo. 

## 2017-06-18 NOTE — ED Provider Notes (Signed)
[facility] Emergency Department Provider Note    First MD Initiated Contact with Patient 06/18/17 1622     (approximate)  I have reviewed the triage vital signs and the nursing notes.   HISTORY  Chief Complaint Chest Pain    HPI Kristin [name] is a 75 y.o. female since with chief complaint of forgetfulness, confusion and chest pain that occurred today but has been ongoing for the past year.  States that she recently stopped taking methotrexate for a history of rheumatoid arthritis.  Denies any fevers.  Does feel fatigued but this is not out of the ordinary.  Does have some anterior chest wall pain but has a history of fibromyalgia which she feels is consistent with this.  Denies any diaphoresis.  States that she was confused while driving to get a haircut, could not remember where she was going since she had to turn herself around and drove back home at which point she called family and presented to the ER for evaluation.  On arrival to the ER she denies any discomfort.  Denies any numbness or tingling.  No head.  Past Medical History:  Diagnosis Date  . Arthritis    recent falls -aug 2010  . [name] (coronary artery disease)    mi-1996  . Complication of anesthesia    pt states takes a long time to wake her wake up  . 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 Cough    no fever   . Degeneration of lumbar or lumbosacral intervertebral disc   . Dizziness    r/t meds  . Fibromyalgia   . History of shingles   . [name] (irritable bowel syndrome)    takes OTC [place] Negus colon health  . Joint pain   . Joint swelling   . Myalgia and myositis, unspecified   . Obstructive sleep apnea (adult) (pediatric)   . Old myocardial infarction 1996  . Osteoarthritis   . Osteoarthrosis, unspecified whether generalized or localized, unspecified site   . Psoriatic arthritis ([place])   . Rheumatoid  arthritis ([place])   . Rosacea   . Seasonal allergies   . Stroke [facility])    ischaemic microvascular disease  . 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 Unspecified essential hypertension    takes Diltiazem,Metoprolol,and Losartan daily  . UTI (lower urinary tract infection)    frequent=last one was winter of 2014   Family History  Problem Relation Age of Onset  . Colon cancer Father   . Heart attack Mother   . Coronary artery disease Mother   . Osteoporosis Mother   . Heart disease Mother   . Heart failure Mother   . GER disease Sister   . Hypertension Sister   . Osteopenia Sister   . GER disease Sister   . Breast cancer Paternal Aunt        age 20  . Heart failure Maternal Grandmother   . Heart failure Maternal Grandfather    Past Surgical History:  Procedure Laterality Date  . ATHERECTOMY  1996  . [place]   vasospams; no blockage  . CARDIAC CATHETERIZATION  2013   No obstructive coronary artery disease. Mild distal LAD and proximal RCA disease.  [name] CATARACT EXTRACTION Bilateral   . COLONOSCOPY    . CT ABD WO/W & PELVIS WO CM  2013   no  acute process, + minimal diverticulosis and hepatic/renal cysts, ATH calcifications  . ETHMOIDECTOMY  1998  . NASAL SINUS SURGERY  1998  . OOPHORECTOMY     BSO  . sinus biopsy  1998   inverted papiloma  . VAGINAL HYSTERECTOMY  1992   for mennorhagia-LAVH BSO   Patient Active Problem List   Diagnosis Date Noted  . DVT, lower extremity, distal, acute, bilateral ([place]) 02/23/2017  . 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 Counseling regarding end of life decision making 01/14/2015  . Oral candidiasis 10/27/2014  . Short-term memory loss 10/17/2013  . UTI (urinary tract infection) 10/17/2013  . Cerebral vascular disease 01/01/2013  . 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[place])     . PVC's (premature ventricular contractions) 06/03/2012  . Post herpetic neuralgia 10/11/2011  . Osteoarthritis 08/10/2011  . MYOCARDIAL INFARCTION, HX OF 08/01/2010  . 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 Fibromyalgia 05/20/2008  . Hyperlipidemia 05/29/2007  . OBSTRUCTIVE SLEEP APNEA 05/29/2007  . Essential hypertension, benign 05/29/2007  . ALLERGIC RHINITIS 05/29/2007  . ACNE, ROSACEA 05/29/2007      Prior to Admission medications   Medication Sig Start Date End Date Taking? Authorizing Provider  acetaminophen (TYLENOL) 325 MG tablet Take 325 mg by mouth every 8 (eight) hours as needed (pain).     [provider]  aspirin EC 325 MG tablet Take 1 tablet (325 mg total) by mouth daily. 02/08/17 02/08/18  Earleen Newport, MD  Calcium Carbonate-Vitamin D (CALTRATE 600+D PO) Take 1 tablet by mouth 2 (two) times daily.     [provider]  Coenzyme Q10 200 MG capsule Take 200 mg by mouth daily.      [provider]  diltiazem (DILACOR XR) 240 MG 24 hr capsule TAKE 1 CAPSULE BY MOUTH EVERY DAY 03/13/17   Wellington Hampshire, MD  econazole nitrate 1 % cream Apply 1 application topically 2 (two) times daily.    [provider]  Multiple Vitamin (MULTIVITAMIN) tablet Take 1 tablet by mouth daily.      [provider]  nitroGLYCERIN (NITROSTAT) 0.4 MG SL tablet Place 1 tablet (0.4 mg total) under the tongue every 5 (five) minutes as needed for chest pain. 08/27/15   Wellington Hampshire, MD  Omega-3 Fatty Acids (FISH OIL) 1200 MG CAPS Take 1 capsule by mouth 2 (two) times daily.     [provider]  Polyethyl Glycol-Propyl Glycol (SYSTANE) 0.4-0.3 % SOLN Apply 1 drop to eye daily as needed (dry eyes).     [provider]  Probiotic Product ([place]) CAPS Take 1 capsule by mouth daily.    [provider]  sulfamethoxazole-trimethoprim (BACTRIM DS,SEPTRA DS) 800-160 MG tablet Take 1 tablet by mouth 2  (two) times daily. 04/05/17   Bedsole, Amy E, MD  traMADol (ULTRAM) 50 MG tablet TAKE ONE TABLET BY MOUTH EVERY 8 HOURS AS NEEDED 03/22/17   Bedsole, Amy E, MD  triamcinolone cream (KENALOG) 0.1 % Apply 1 application topically 2 (two) times daily as needed.  01/01/15   [provider]  TURMERIC PO Take 1 tablet by mouth daily.    [provider]  vitamin B-12 (CYANOCOBALAMIN) 1000 MCG tablet Take 1,000 mcg by mouth daily.    [provider]    Allergies Angiotensin receptor blockers; Hydralazine; and Penicillins    Social History Social History   Tobacco Use  . Smoking status:  Never Smoker  . Smokeless tobacco: Never Used  Substance Use Topics  . Alcohol use: No    Alcohol/week: 0.0 oz    Comment: Rare  . Drug use: No    Review of Systems Patient denies headaches, rhinorrhea, blurry vision, numbness, shortness of breath, chest pain, edema, cough, abdominal pain, nausea, vomiting, diarrhea, dysuria, fevers, rashes or hallucinations unless otherwise stated above in HPI. ____________________________________________   PHYSICAL EXAM:  VITAL SIGNS: Vitals:   06/18/17 1325 06/18/17 1625  BP: (!) 157/50 (!) 161/64  Pulse: 66 68  Resp: 18 18  Temp: 98.5 F (36.9 C)   SpO2: 100% 100%    Constitutional: Alert and oriented. Well appearing and in no acute distress. Eyes: Conjunctivae are normal.  Head: Atraumatic. Nose: No congestion/rhinnorhea. Mouth/Throat: Mucous membranes are moist.   Neck: No stridor. Painless ROM.  Cardiovascular: Normal rate, regular rhythm. Grossly normal heart sounds.  Good peripheral circulation. Respiratory: Normal respiratory effort.  No retractions. Lungs CTAB. Gastrointestinal: Soft and nontender. No distention. No abdominal bruits. No CVA tenderness. Genitourinary:  Musculoskeletal: No lower extremity tenderness nor edema.  No joint effusions. Neurologic:  CN- intact.  No facial droop, Normal FNF.  Normal heel to shin.   Sensation intact bilaterally. Normal speech and language. No gross focal neurologic deficits are appreciated. No gait instability. Skin:  Skin is warm, dry and intact. No rash noted. Psychiatric: Mood and affect are normal. Speech and behavior are normal.  ____________________________________________   LABS (all labs ordered are listed, but only abnormal results are displayed)  Results for orders placed or performed during the hospital encounter of 06/18/17 (from the past 24 hour(s))  Basic metabolic panel     Status: Abnormal   Collection Time: 06/18/17  1:24 PM  Result Value Ref Range   Sodium 137 135 - 145 mmol/L   Potassium 3.4 (L) 3.5 - 5.1 mmol/L   Chloride 104 101 - 111 mmol/L   CO2 25 22 - 32 mmol/L   Glucose, Bld 97 65 - 99 mg/dL   BUN 30 (H) 6 - 20 mg/dL   Creatinine, Ser 0.97 0.44 - 1.00 mg/dL   Calcium 9.6 8.9 - 10.3 mg/dL   GFR calc non Af Amer 55 (L) >60 mL/min   GFR calc Af Amer >60 >60 mL/min   Anion gap 8 5 - 15  CBC     Status: Abnormal   Collection Time: 06/18/17  1:24 PM  Result Value Ref Range   WBC 8.4 3.6 - 11.0 K/uL   RBC 3.84 3.80 - 5.20 MIL/uL   Hemoglobin 12.4 12.0 - 16.0 g/dL   HCT 36.9 35.0 - 47.0 %   MCV 96.3 80.0 - 100.0 fL   MCH 32.4 26.0 - 34.0 pg   MCHC 33.6 32.0 - 36.0 g/dL   RDW 16.9 (H) 11.5 - 14.5 %   Platelets 280 150 - 440 K/uL  Troponin I     Status: None   Collection Time: 06/18/17  1:24 PM  Result Value Ref Range   Troponin I <0.03 <0.03 ng/mL   ____________________________________________  EKG My review and personal interpretation at Time: 13:22   Indication: chest pain  Rate: 70  Rhythm: sinus Axis: normal Other: normal intervals, no stemi ____________________________________________  RADIOLOGY  I personally reviewed all radiographic images ordered to evaluate for the above acute complaints and reviewed radiology reports and findings.  These findings were personally discussed with the patient.  Please see medical record  for radiology report.  ____________________________________________  PROCEDURES  Procedure(s) performed:  Procedures    Critical Care performed: no ____________________________________________   INITIAL IMPRESSION / ASSESSMENT AND PLAN / ED COURSE  Pertinent labs & imaging results that were available during my care of the patient were reviewed by me and considered in my medical decision making (see chart for details).  DDX: uti, dehydration, acs, pna, chf, tia, cva, mass, medication reactions  TARINI CARRIER is a 76 y.o. who presents to the ED with above symptoms.  Blood work is reassuring and shows no evidence of infection or elect light abnormality.  No evidence of UTI.  Will order CT imaging to evaluate for mass or CVA however based on her presentation I do suspect some component of chronic confusion and forgetfulness.  The patient will be placed on continuous pulse oximetry and telemetry for monitoring.  Laboratory evaluation will be sent to evaluate for the above complaints.     Clinical Course as of Jun 19 1999  Mon Jun 18, 2017  1540 Patient evaluated at bedside and remains asymptomatic.  Per discussion states that the forgetfulness has been ongoing for several years.  Methotrexate level will be sent for evaluation.  Patient is currently taking folic acid.  Encouraged to continue taking this.  At this point I do believe that she is stable and appropriate for further workup as an outpatient, as all of her blood work thus far is reassuring she has no evidence of infectious process.  Patient was able to tolerate PO and was able to ambulate with a steady gait.  Have discussed with the patient and available family all diagnostics and treatments performed thus far and all questions were answered to the best of my ability. The patient demonstrates understanding and agreement with plan.  [PR]    Clinical Course User Index [PR] Merlyn Lot, MD      ____________________________________________   FINAL CLINICAL IMPRESSION(S) / ED DIAGNOSES  Final diagnoses:  Chest pain, unspecified type  Forgetfulness      NEW MEDICATIONS STARTED DURING THIS VISIT:  This SmartLink is deprecated. Use AVSMEDLIST instead to display the medication list for a patient.   Note:  This document was prepared using Dragon voice recognition software and may include unintentional dictation errors.    Merlyn Lot, MD 06/18/17 2003

## 2017-06-19 ENCOUNTER — Other Ambulatory Visit
Admission: RE | Admit: 2017-06-19 | Discharge: 2017-06-19 | Disposition: A | Discharge status: Home / Self Care | Payer: PPO | Source: Ambulatory Visit | Attending: Student in an Organized Health Care Education/Training Program | Admitting: Student in an Organized Health Care Education/Training Program

## 2017-06-19 DIAGNOSIS — R079 Chest pain, unspecified: Secondary | ICD-10-CM | POA: Insufficient documentation

## 2017-06-19 DIAGNOSIS — R413 Other amnesia: Secondary | ICD-10-CM | POA: Diagnosis not present

## 2017-06-20 LAB — MISC LABCORP TEST (SEND OUT): LABCORP TEST CODE: 7658

## 2017-06-26 ENCOUNTER — Other Ambulatory Visit: Payer: Self-pay

## 2017-06-26 MED ORDER — NITROGLYCERIN 0.4 MG SL SUBL: 0.4000 mg | SUBLINGUAL_TABLET | SUBLINGUAL | 3 refills | Status: DC | PRN

## 2017-06-28 ENCOUNTER — Other Ambulatory Visit: Payer: Self-pay

## 2017-06-28 ENCOUNTER — Ambulatory Visit: Payer: PPO | Admitting: Family Medicine

## 2017-06-28 ENCOUNTER — Encounter: Payer: Self-pay | Admitting: Family Medicine

## 2017-06-28 DIAGNOSIS — R413 Other amnesia: Secondary | ICD-10-CM

## 2017-06-28 DIAGNOSIS — R079 Chest pain, unspecified: Secondary | ICD-10-CM | POA: Diagnosis not present

## 2017-06-28 NOTE — Progress Notes (Signed)
Subjective:    Patient ID: Kristin [name], female    DOB: 31-Dec-1940, 76 y.o.   MRN: [id number]  HPI    76 year old female with history of HTN, CVA, [name],   presents for ER follow up of chest pain and memory loss.  Seen at ED on 06/18/2017. Summary copied as follows for informational purposes.  Chief complaint of forgetfulness, confusion and chest pain that occurred today but has been ongoing for the past year.  States that she recently stopped taking methotrexate for a history of rheumatoid arthritis.  Denies any fevers.  Does feel fatigued but this is not out of the ordinary.  Does have some anterior chest wall pain but has a history of fibromyalgia which she feels is consistent with this.  Denies any diaphoresis.  States that she was confused while driving to get a haircut, could not remember where she was going since she had to turn herself around and drove back home at which point she called family and presented to the ER for evaluation.    EKG: stable  Labs:nml BMET,cbc, troponin I, folate UA: clear Head CT: no acute changes: chronic small vessel disease and atrophy. Methotrexate level:0.2  No acute cause found.  Felt chronic changes or related methotrexate... Chest pain due to fibro and chronic memory issues.   She is now off methotrexate in last 10 days. Dr. Audelia Hives partner told her to stop taking it. She has follow up on RA in next few months.  She feels like she has has minimal improvement in memory. She is very forgetful, repeating question She is having red itchy rash on legs and back.  She has joint pain all over from poorly controlled RA. Chest pain is improved.  Review of Systems     Objective:   Physical Exam  Constitutional: Vital signs are normal. She appears well-developed and well-nourished. She is cooperative.  Non-toxic appearance. She does not appear ill. No distress.  HENT:  Head: Normocephalic.  Right Ear: Hearing, tympanic membrane, external ear and ear  canal normal. Tympanic membrane is not erythematous, not retracted and not bulging.  Left Ear: Hearing, tympanic membrane, external ear and ear canal normal. Tympanic membrane is not erythematous, not retracted and not bulging.  Nose: No mucosal edema or rhinorrhea. Right sinus exhibits no maxillary sinus tenderness and no frontal sinus tenderness. Left sinus exhibits no maxillary sinus tenderness and no frontal sinus tenderness.  Mouth/Throat: Uvula is midline, oropharynx is clear and moist and mucous membranes are normal.  Eyes: Conjunctivae, EOM and lids are normal. Pupils are equal, round, and reactive to light. Lids are everted and swept, no foreign bodies found.  Neck: Trachea normal and normal range of motion. Neck supple. Carotid bruit is not present. No thyroid mass and no thyromegaly present.  Cardiovascular: Normal rate, regular rhythm, S1 normal, S2 normal, normal heart sounds, intact distal pulses and normal pulses. Exam reveals no gallop and no friction rub.  No murmur heard. Pulmonary/Chest: Effort normal and breath sounds normal. No tachypnea. No respiratory distress. She has no decreased breath sounds. She has no wheezes. She has no rhonchi. She has no rales.  Abdominal: Soft. Normal appearance and bowel sounds are normal. There is no tenderness.  Neurological: She is alert. She has normal strength. No cranial nerve deficit or sensory deficit. Coordination and gait normal.  Skin: Skin is warm, dry and intact. No rash noted.  Psychiatric: Her speech is normal. Judgment and thought content normal. Her mood  appears not anxious. She is slowed and withdrawn. Cognition and memory are normal. She does not exhibit a depressed mood.      MMSE today 24/30,  Poor short term memory and executive functioning    Assessment & Plan:

## 2017-06-28 NOTE — Patient Instructions (Signed)
Stay off methotrexate. Stay on folate.  Follow up in 1 month for repeat MMSE and memory evaluation.  If new neuro symptoms or memory worseing... call ASAP.

## 2017-06-29 ENCOUNTER — Ambulatory Visit: Payer: PPO | Admitting: Cardiovascular Disease

## 2017-07-16 DIAGNOSIS — R079 Chest pain, unspecified: Secondary | ICD-10-CM | POA: Insufficient documentation

## 2017-07-16 NOTE — Assessment & Plan Note (Signed)
Stay off methotrexate. Stay on folate.  Follow up in 1 month for repeat MMSE and memory evaluation.

## 2017-07-16 NOTE — Assessment & Plan Note (Signed)
Eval at ED reviewed. Most liekly noncardiac.  May be MTX  SE, cummulative.

## 2017-07-18 DIAGNOSIS — L308 Other specified dermatitis: Secondary | ICD-10-CM | POA: Diagnosis not present

## 2017-07-24 ENCOUNTER — Other Ambulatory Visit: Payer: Self-pay | Admitting: Cardiovascular Disease

## 2017-07-26 ENCOUNTER — Ambulatory Visit: Payer: PPO | Admitting: Podiatry

## 2017-07-26 ENCOUNTER — Encounter: Payer: Self-pay | Admitting: Podiatry

## 2017-07-26 DIAGNOSIS — B351 Tinea unguium: Secondary | ICD-10-CM | POA: Diagnosis not present

## 2017-07-26 DIAGNOSIS — M2041 Other hammer toe(s) (acquired), right foot: Secondary | ICD-10-CM

## 2017-07-26 DIAGNOSIS — M79609 Pain in unspecified limb: Secondary | ICD-10-CM

## 2017-07-26 DIAGNOSIS — M2042 Other hammer toe(s) (acquired), left foot: Secondary | ICD-10-CM

## 2017-07-26 NOTE — Progress Notes (Signed)
Complaint:  Visit Type: Patient returns to my office for continued preventative foot care services. Complaint: Patient states" my nails have grown long and thick and become painful to walk and wear shoes" . The patient presents for preventative foot care services. No changes to ROS. Patient has history of RA.  Podiatric Exam: Vascular: dorsalis pedis and posterior tibial pulses are palpable bilateral. Capillary return is immediate. Temperature gradient is WNL. Skin turgor WNL  Sensorium: Normal Semmes Weinstein monofilament test. Normal tactile sensation bilaterally. Nail Exam: Pt has thick disfigured discolored nails with subungual debris noted bilateral entire nail hallux through fifth toenails Ulcer Exam: There is no evidence of ulcer or pre-ulcerative changes or infection. Orthopedic Exam: Muscle tone and strength are WNL. No limitations in general ROM. No crepitus or effusions noted. Hallux limitus 1st MPJ  B/L.  Hammer toe second  B/L Skin: No Porokeratosis. No infection or ulcers  Diagnosis:  Onychomycosis, , Pain in right toe, pain in left toes  Treatment & Plan Procedures and Treatment: Consent by patient was obtained for treatment procedures. The patient understood the discussion of treatment and procedures well. All questions were answered thoroughly reviewed. Debridement of mycotic and hypertrophic toenails, 1 through 5 bilateral and clearing of subungual debris. No ulceration, no infection noted. Return Visit-Office Procedure: Patient instructed to return to the office for a follow up visit 3 months for continued evaluation and treatment.    Gardiner Barefoot DPM

## 2017-07-27 ENCOUNTER — Ambulatory Visit: Payer: PPO | Admitting: Family Medicine

## 2017-07-27 ENCOUNTER — Encounter: Payer: Self-pay | Admitting: Family Medicine

## 2017-07-27 VITALS — BP 122/70 | HR 68 | Temp 98.2°F | Ht 64.0 in | Wt 142.0 lb

## 2017-07-27 DIAGNOSIS — M069 Rheumatoid arthritis, unspecified: Secondary | ICD-10-CM | POA: Insufficient documentation

## 2017-07-27 DIAGNOSIS — M0579 Rheumatoid arthritis with rheumatoid factor of multiple sites without organ or systems involvement: Secondary | ICD-10-CM | POA: Diagnosis not present

## 2017-07-27 DIAGNOSIS — G4733 Obstructive sleep apnea (adult) (pediatric): Secondary | ICD-10-CM

## 2017-07-27 DIAGNOSIS — R413 Other amnesia: Secondary | ICD-10-CM

## 2017-07-27 LAB — VITAMIN B12: VITAMIN B 12: 779 pg/mL (ref 211–911)

## 2017-07-27 LAB — VITAMIN D 25 HYDROXY (VIT D DEFICIENCY, FRACTURES): VITD: 49.47 ng/mL (ref 30.00–100.00)

## 2017-07-27 NOTE — Patient Instructions (Addendum)
Please stop at the lab to have labs drawn. Stop dramamine and benadryl. Limit tramadol as able. Can use tylenol instead.  Can try melatonin 3 mg for sleep issues if needed.  Call if interested in referral for sleep apnea eval or neuro for dementia. Please stop at the front desk to set up referral PT.

## 2017-07-27 NOTE — Progress Notes (Signed)
   Subjective:    Patient ID: Kristin [name], female    DOB: [birth year], 75 y.o.   MRN: [id number]  HPI    76 year old female presents for follow up memory loss.   1 month ago had acute confusion.. Felt to be due at least in part to methotrexate toxicity. Head CT: no acute changes: chronic small vessel disease and atrophy. Methotrexate level:0.2 At  06/2017 OV.. MMSE was 24/30 with deficit in short term memory and executive functioning.   She reports today her memory has not improved at at all off the methotrexate.     Her husband reports that in last 1-1.5 years memory has decreased gradually. She had only been on methotrexate for 3 weeks.   She does have  untreated Sleep apnea... Cannot tolerate the CPAP. She does take benadryl and dramamine at bedtime!  She denies depression.  PHQ 2 0      Review of Systems  Constitutional: Negative for fatigue and fever.  HENT: Negative for congestion.   Eyes: Negative for pain.  Respiratory: Negative for cough and shortness of breath.   Cardiovascular: Negative for chest pain, palpitations and leg swelling.  Gastrointestinal: Negative for abdominal pain.  Genitourinary: Negative for dysuria and vaginal bleeding.  Musculoskeletal: Negative for back pain.  Neurological: Negative for syncope, light-headedness and headaches.  Psychiatric/Behavioral: Positive for confusion, dysphoric mood and sleep disturbance.       Objective:   Physical Exam  Constitutional: Vital signs are normal. She appears well-developed and well-nourished. She is cooperative.  Non-toxic appearance. She does not appear ill. No distress.  HENT:  Head: Normocephalic.  Right Ear: Hearing, tympanic membrane, external ear and ear canal normal. Tympanic membrane is not erythematous, not retracted and not bulging.  Left Ear: Hearing, tympanic membrane, external ear and ear canal normal. Tympanic membrane is not erythematous, not retracted and not bulging.  Nose: No  mucosal edema or rhinorrhea. Right sinus exhibits no maxillary sinus tenderness and no frontal sinus tenderness. Left sinus exhibits no maxillary sinus tenderness and no frontal sinus tenderness.  Mouth/Throat: Uvula is midline, oropharynx is clear and moist and mucous membranes are normal.  Eyes: Conjunctivae, EOM and lids are normal. Pupils are equal, round, and reactive to light. Lids are everted and swept, no foreign bodies found.  Neck: Trachea normal and normal range of motion. Neck supple. Carotid bruit is not present. No thyroid mass and no thyromegaly present.  Cardiovascular: Normal rate, regular rhythm, S1 normal, S2 normal, normal heart sounds, intact distal pulses and normal pulses. Exam reveals no gallop and no friction rub.  No murmur heard. Pulmonary/Chest: Effort normal and breath sounds normal. No tachypnea. No respiratory distress. She has no decreased breath sounds. She has no wheezes. She has no rhonchi. She has no rales.  Abdominal: Soft. Normal appearance and bowel sounds are normal. There is no tenderness.  Neurological: She is alert. She has normal strength. No cranial nerve deficit or sensory deficit. Coordination and gait normal.  Skin: Skin is warm, dry and intact. No rash noted.  Psychiatric: Her speech is normal. Judgment and thought content normal. Her mood appears not anxious. She is slowed and withdrawn. Cognition and memory are normal. She does not exhibit a depressed mood.          Assessment & Plan:

## 2017-07-31 ENCOUNTER — Encounter: Payer: Self-pay | Admitting: Cardiovascular Disease

## 2017-07-31 ENCOUNTER — Ambulatory Visit: Payer: PPO | Admitting: Cardiovascular Disease

## 2017-07-31 VITALS — BP 132/62 | HR 70 | Ht 66.0 in | Wt 139.0 lb

## 2017-07-31 DIAGNOSIS — I493 Ventricular premature depolarization: Secondary | ICD-10-CM

## 2017-07-31 DIAGNOSIS — I25111 Atherosclerotic heart disease of native coronary artery with angina pectoris with documented spasm: Secondary | ICD-10-CM | POA: Diagnosis not present

## 2017-07-31 DIAGNOSIS — I10 Essential (primary) hypertension: Secondary | ICD-10-CM

## 2017-07-31 NOTE — Patient Instructions (Signed)
Medication Instructions: Continue same medications.   Labwork: None.   Procedures/Testing: None.   Follow-Up: 1 year with Dr. Arida.   Any Additional Special Instructions Will Be Listed Below (If Applicable).     If you need a refill on your cardiac medications before your next appointment, please call your pharmacy.   

## 2017-07-31 NOTE — Progress Notes (Signed)
Cardiology Office Note   Date:  07/31/2017   ID:  Kristin, [name] 01-16-1941, MRN [id number]  PCP:  Jinny Sanders, MD  Cardiologist:   Kathlyn Sacramento, MD   Chief Complaint  Patient presents with  . other    6 month f/u no complaints today pt has cold. Meds reviewed verbally with pt.      History of Present Illness: Kristin [name] is a 76 y.o. female who presents for a follow-up visit regarding hypertension and frequent PVCs. 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   She had cardiac catheterization in 05/2012 which showed no significant [name]. Only mild distal LAD and RCA disease She has known palpitations due to PVCs and ventricular bigeminy.  She has known history of refractory hypertension with negative workup for secondary hypertension.  During her last visit her labs showed dehydration with associated hypotension.  Spironolactone, losartan-hydrochlorothiazide were stopped.  She developed leg swelling after her trip to [place] in June.  She went to the emergency room and was found to have bilateral below the knee DVT.  She was treated with aspirin.  Repeat ultrasound after few weeks showed no evidence of propagation. She has been doing reasonably well and denies any chest pain, shortness of breath or palpitations. She continues to suffer from intermittent confusion and memory loss.  Past Medical History:  Diagnosis Date  . Arthritis    recent falls -aug 2010  . [name] (coronary artery disease)    mi-1996  . Complication of anesthesia    pt states takes a long time to wake her wake up  . 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 ([place]) 08/27/2015  . Cough    no fever   . Degeneration of lumbar or lumbosacral  intervertebral disc   . Dizziness    r/t meds  . Fibromyalgia   . History of shingles   . [name] (irritable bowel syndrome)    takes OTC Hardin Negus colon health  . Joint pain   . Joint swelling   . Myalgia and myositis, unspecified   . Obstructive sleep apnea (adult) (pediatric)   . Old myocardial infarction 1996  . Osteoarthritis   . Osteoarthrosis, unspecified whether generalized or localized, unspecified site   . Psoriatic arthritis ([place])   . Rheumatoid arthritis ([place])   . Rosacea   . Seasonal allergies   . Stroke [facility])    ischaemic microvascular disease  . 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 Unspecified essential hypertension    takes Diltiazem,Metoprolol,and Losartan daily  . UTI (lower urinary tract infection)    frequent=last one was winter of 2014    Past Surgical History:  Procedure Laterality Date  . ATHERECTOMY  1996  . [place]   vasospams; no blockage  . CARDIAC CATHETERIZATION  2013   No obstructive coronary artery disease. Mild distal LAD and proximal RCA disease.  [name] CATARACT EXTRACTION Bilateral   . COLONOSCOPY    . CT ABD WO/W & PELVIS WO CM  2013   no acute process, + minimal diverticulosis and hepatic/renal cysts, ATH calcifications  . ETHMOIDECTOMY  1998  . 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 NASAL SINUS SURGERY  1998  . OOPHORECTOMY     BSO  . sinus biopsy  1998   inverted papiloma  . VAGINAL HYSTERECTOMY  1992   for mennorhagia-LAVH BSO     Current Outpatient Medications  Medication Sig Dispense Refill  . acetaminophen (TYLENOL) 325 MG tablet Take 325 mg by mouth every 8 (eight) hours as needed (pain).     [name] aspirin EC 325 MG tablet Take 1 tablet (325 mg total) by mouth daily. 100 tablet 3  . Calcium  Carbonate-Vitamin D (CALTRATE 600+D PO) Take 1 tablet by mouth 2 (two) times daily.     . Coenzyme Q10 200 MG capsule Take 200 mg by mouth daily.      [name] diltiazem (CARDIZEM CD) 240 MG 24 hr capsule TAKE ONE CAPSULE BY MOUTH DAILY 90 capsule 0  . econazole nitrate 1 % cream Apply 1 application topically 2 (two) times daily.    [name] FOLIC ACID PO Take by mouth daily.    . Multiple Vitamin (MULTIVITAMIN) tablet Take 1 tablet by mouth daily.      . nitroGLYCERIN (NITROSTAT) 0.4 MG SL tablet Place 1 tablet (0.4 mg total) every 5 (five) minutes as needed under the tongue for chest pain. 30 tablet 3  . Omega-3 Fatty Acids (FISH OIL) 1200 MG CAPS Take 1 capsule by mouth 2 (two) times daily.     Vladimir Faster Glycol-Propyl Glycol (SYSTANE) 0.4-0.3 % SOLN Apply 1 drop to eye daily as needed (dry eyes).     . Probiotic Product ([place]) CAPS Take 1 capsule by mouth daily.    . vitamin B-12 (CYANOCOBALAMIN) 1000 MCG tablet Take 1,000 mcg by mouth daily.     No current facility-administered medications for this visit.     Allergies:   Angiotensin receptor blockers; Hydralazine; Losartan; Methotrexate derivatives; Spironolactone; and Penicillins    Social History:  The patient  reports that  has never smoked. she has never used smokeless tobacco. She reports that she does not drink alcohol or use drugs.   Family History:  The patient's family history includes Breast cancer in her paternal aunt; Colon cancer in her father; Coronary artery disease in her mother; GER disease in her sister and sister; Heart attack in her mother; Heart disease in her mother; Heart failure in her maternal grandfather, maternal grandmother, and mother; Hypertension in her sister; Osteopenia in her sister; Osteoporosis in her mother.    ROS:  Please see the history of present illness.   Otherwise, review of systems are positive for none.   All other systems are reviewed and negative.    PHYSICAL EXAM: VS:  BP 132/62  (BP Location: Left Arm, Patient Position: Sitting, Cuff Size: Normal)   Pulse 70   Ht 5\' 6"  (1.676 m)   Wt 139 lb (63 kg)   LMP 08/14/1989   BMI 22.44 kg/m  , BMI Body mass index is 22.44 kg/m. GEN: Well nourished, well developed, in no acute distress  HEENT: normal  Neck: no JVD, carotid bruits, or masses Cardiac: RRR; no murmurs, rubs, or gallops,no edema  Respiratory:  clear to auscultation bilaterally, normal work of breathing GI: soft, nontender, nondistended, + BS MS: no deformity or atrophy  Skin: warm and dry, no rash Neuro:  Strength and sensation are intact Psych: euthymic mood, full affect   EKG:  EKG is  ordered today. Normal sinus rhythm with nonspecific T wave changes.    Recent Labs: 02/19/2017: ALT 11; TSH 2.44 06/18/2017: BUN 30; Creatinine, Ser 0.97; Hemoglobin 12.4; Platelets 280; Potassium 3.4; Sodium 137    Lipid Panel    Component Value Date/Time   CHOL 217 (H) 02/19/2017 1412   TRIG 156.0 (H) 02/19/2017 1412   HDL 58.70 02/19/2017 1412   CHOLHDL 4 02/19/2017 1412   VLDL 31.2 02/19/2017 1412   LDLCALC 128 (H) 02/19/2017 1412   LDLDIRECT 158.1 10/02/2013 0914      Wt Readings from Last 3 Encounters:  07/31/17 139 lb (63 kg)  07/27/17 142 lb (64.4 kg)  06/28/17 142 lb 12 oz (64.8 kg)        ASSESSMENT AND PLAN:  1. Coronary artery disease involving native coronary arteries with previous spasm: No anginal symptoms. Continue medical therapy.  2. 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  3.  Essential hypertension: Controlled with diltiazem.    Disposition:   FU with me in 12 months  Signed,  Kathlyn Sacramento, MD  07/31/2017 2:02 PM    [place]

## 2017-08-03 ENCOUNTER — Encounter: Payer: Self-pay | Admitting: Family Medicine

## 2017-08-03 ENCOUNTER — Other Ambulatory Visit: Payer: Self-pay | Admitting: Cardiovascular Disease

## 2017-08-03 NOTE — Assessment & Plan Note (Signed)
Pt request referral to PT for increased mobility. Currently not treated with DMARD given SE to methotrexate in past and pt concern about using  immunologics.

## 2017-08-03 NOTE — Assessment & Plan Note (Addendum)
Appears more chronic than acute.  CT showed chronic microvascular changes that may be causing dementia. Eval with labs for secondary cause.  May in part be due to mood. Possibly worsened with SE to meds: Stop dramamine and benadryl. Limit tramadol as able. Can use tylenol instead.  Sleep apnea eval and treat.  If not improving consider referral to neuro or further eval with MRI  Or initiation of aricept.

## 2017-08-03 NOTE — Assessment & Plan Note (Signed)
Untreated.. May be contributing to symtpoms and short term memory loss.  Recommend eval with sleep study.. Pt will consider.

## 2017-08-21 ENCOUNTER — Other Ambulatory Visit: Payer: Self-pay

## 2017-08-21 ENCOUNTER — Ambulatory Visit: Payer: PPO | Attending: Family Medicine

## 2017-08-21 DIAGNOSIS — M25562 Pain in left knee: Secondary | ICD-10-CM | POA: Diagnosis not present

## 2017-08-21 DIAGNOSIS — M25561 Pain in right knee: Secondary | ICD-10-CM | POA: Diagnosis not present

## 2017-08-21 DIAGNOSIS — G8929 Other chronic pain: Secondary | ICD-10-CM | POA: Insufficient documentation

## 2017-08-21 DIAGNOSIS — M25552 Pain in left hip: Secondary | ICD-10-CM | POA: Diagnosis not present

## 2017-08-21 NOTE — Therapy (Signed)
[facility] [address] [place], [address] Phone: [phone]   Fax:  [phone]  Physical Therapy Evaluation  Patient Details  Name: Kristin [name] MRN: [id number] Date of Birth: [birth year] Referring Provider: Eliezer Lofts   Encounter Date: 08/21/2017  PT End of Session - 08/21/17 1356    Visit Number  1    Number of Visits  13    Date for PT Re-Evaluation  10/02/17    PT Start Time  1300    PT Stop Time  1400    PT Time Calculation (min)  60 min    Equipment Utilized During Treatment  Gait belt    Activity Tolerance  Patient tolerated treatment well    Behavior During Therapy  [facility] for tasks assessed/performed       Past Medical History:  Diagnosis Date  . Arthritis    recent falls -aug 2010  . [name] (coronary artery disease)    mi-1996  . Complication of anesthesia    pt states takes a long time to wake her wake up  . 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 Cough    no fever   . Degeneration of lumbar or lumbosacral intervertebral disc   . Dizziness    r/t meds  . Fibromyalgia   . History of shingles   . [name] (irritable bowel syndrome)    takes OTC [place] Negus colon health  . Joint pain   . Joint swelling   . Myalgia and myositis, unspecified   . Obstructive sleep apnea (adult) (pediatric)   . Old myocardial infarction 1996  . Osteoarthritis   . Osteoarthrosis, unspecified whether generalized or localized, unspecified site   . Psoriatic arthritis ([place])   . Rheumatoid arthritis ([place])   . Rosacea   . Seasonal allergies   . Stroke [facility])    ischaemic microvascular disease  . 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 Unspecified essential hypertension    takes Diltiazem,Metoprolol,and Losartan daily  . UTI (lower urinary tract infection)    frequent=last one was  winter of 2014    Past Surgical History:  Procedure Laterality Date  . ATHERECTOMY  1996  . [place]   vasospams; no blockage  . CARDIAC CATHETERIZATION  2013   No obstructive coronary artery disease. Mild distal LAD and proximal RCA disease.  [name] CATARACT EXTRACTION Bilateral   . COLONOSCOPY    . CT ABD WO/W & PELVIS WO CM  2013   no acute process, + minimal diverticulosis and hepatic/renal cysts, ATH calcifications  . ETHMOIDECTOMY  1998  . 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 NASAL SINUS SURGERY  1998  . OOPHORECTOMY     BSO  . sinus biopsy  1998   inverted papiloma  . VAGINAL HYSTERECTOMY  1992   for mennorhagia-LAVH BSO    There were no vitals filed for this visit.   Subjective Assessment - 08/21/17 1255    Subjective  Rheumatoid arthritis    Pertinent History  Pt reports she has a history of rheumatoid arthritis which results in bilateral knee pain, posterior L hip pain, bilateral hand pain, bilateral elbow pain. Her pain has  worsened since June. She is most bothered by her bilateral knee and L posterior hip pain. She has been receiving acupuncture through [facility] which she feels has been helping. She tried Methotrexate but was unable to tolerate it due to an itchy rash that developed. Today's session focused on patient's knee pain. Worst knee pain is 10/10, best: 0/10, present: 0/10. Worst hip pain: 10/10, Best: 1/10, Present: 1/10. She describes the pain as "grinding" in the knees and "sharp" in the hip. Knee pain doesn't radiate. She repeats multiple times the fact that she has crepitus in her knees. L posterior hip pain radiates down to the posterior knee. She sees a rheumatologist who gives her "shots in my sciatic nerve" which helps some. She is unable to recall when she last had an  injection. She states that she will follow-up with her rheumatologist in in late January/early February. Denies numbness/tingling in bilateral legs. She complains of bilateral LE weakness, worse on the left. Pain in the knees lasts all day and she doesn't notice any difference in severity from morning to night. Hip pain wakes her up at night but not knee pain. ROS negative for red flags.    Limitations  Walking    How long can you sit comfortably?  30 minutes    How long can you stand comfortably?  10 minutes    How long can you walk comfortably?  5-10 minutes    Diagnostic tests  None related to knees or posterior hip    Patient Stated Goals  Decreased knee pain, "be able to walk farther." Decreased "sciatic nerve pain."    Currently in Pain?  No/denies    Pain Score  0-No pain Worst: 10/10, Best: 0/10    Pain Location  Knee    Pain Orientation  Right;Left    Pain Descriptors / Indicators  -- Grinding    Pain Type  Chronic pain    Pain Radiating Towards  None    Pain Onset  More than a month ago    Pain Frequency  Intermittent    Aggravating Factors   moving, sitting for extended periods, standing, steps    Pain Relieving Factors  Rest, Biofreeze    Multiple Pain Sites  Yes    Pain Score  1 Worst: 10/10, Best: 1/10    Pain Location  Hip    Pain Orientation  Left    Pain Descriptors / Indicators  Sharp    Pain Type  Chronic pain    Pain Radiating Towards  Down to posterior L knee    Pain Onset  More than a month ago    Pain Frequency  Constant    Aggravating Factors   walking, extended sitting/standing, going up/down stairs    Pain Relieving Factors  Biofreeze, changing out of position         [facility] PT Assessment - 08/21/17 1255      Assessment   Medical Diagnosis  Rhematoid arthritis    Referring Provider  Amy Bedsole    Onset Date/Surgical Date  01/19/17 Worsening since June    Hand Dominance  Right    Next MD Visit  February 2019    Prior Therapy  No prior physical  therapy. Has been doing acupuncture recently      Precautions   Precautions  None      Restrictions   Weight Bearing Restrictions  No      Balance Screen   Has the patient fallen in the past  6 months  No    Has the patient had a decrease in activity level because of a fear of falling?   Yes    Is the patient reluctant to leave their home because of a fear of falling?   No      Home Film/video editor residence    Living Arrangements  Spouse/significant other    Available Help at Discharge  Family    Type of [place] to enter    Entrance Stairs-Number of Steps  2    Entrance Stairs-Rails  Can reach both    [place] - 2 wheels;Walker - standard;Cane - single point;Bedside commode      Prior Function   Level of Independence  Independent    Vocation  Retired    Biomedical scientist  Retired Therapist, sports    Leisure  Working in yard, Therapist, occupational   Overall Cognitive Status  No family/caregiver present to determine baseline cognitive functioning Pt reports recent worsening of memory      Observation/Other Assessments   Other Surveys   Other Surveys    Modified [place]  44%    Lower Extremity Functional Scale   32/80    Fear Avoidance Belief Questionnaire (FABQ)   10/30 for physical activity. Work score is 0      Sensation   Additional Comments  Denies N/T with light touch throughout bilateral UE/LE      Posture/Postural Control   Posture Comments  Forward head and rounded shoulders. Mild thoracic kyphosis. flattened lower back with decreased lumbar lordosis noted.       ROM / Strength   AROM / PROM / Strength  AROM;Strength      AROM   Overall AROM Comments  Limited knee extension 1-2 degrees on RLE and 3-5 degrees LLE. Full knee flexion bilaterally but pain with flexion OP on RLE. No gross swelling noted in knees. Positive Ely bilaterally for anterior thigh pain and limited muscle length. Positive low back  pain with Ely on LLE. All ligamentous laxity testing of bilateral knees are painless and WNL. McMuuray testing negative bilaterally. L patellar hypomobility and crepitus noted with mild discomfort in multiple directions. Lower lumbar CPA are positive for increase in pain and hypomobility. However no reproduction of radiating pain. Good hip IR/ER PROM bilaterally with increase in "sciatic" pain with L hip PROM internal rotation (stretching external rotators). Pt with at least 15 degrees of hip extension bilaterally.      Strength   Overall Strength Comments  Bilateral hip flexion and extension strength is grossly 4-/5 bilaterally. 4+ to 5/5 knee extension and flexion as well as ankle dorsiflexion bilaterally. Pt reports some mild L knee pain with resistance testing.        Palpation   Palpation comment  Mild suprapatellar swelling noted bilaterally. Otherwise no bruising, warmth, or redness. Mild tenderness to palpation along medial and lateral joint lines bilaterally. Significant pain and guarding with gentle palpation along lumbar paraspinals and posterior hips.      Standardized Balance Assessment   Standardized Balance Assessment  Timed Up and Go Test;Five Times Sit to Stand;10 meter walk test    Five times sit to stand comments   41.8    10 Meter Walk  8.8s = 1.14 m/s      Timed Up and Go Test   TUG  Normal TUG    Normal  TUG (seconds)  12              Objective measurements completed on examination: See above findings.     Pt instructed in bridges and single knee to chest stretch. Written handout provided with verbal education.          PT Education - 08/21/17 1356    Education provided  Yes    Education Details  Plan of care, goals, and HEP    Person(s) Educated  Patient    Methods  Explanation    Comprehension  Verbalized understanding       PT Short Term Goals - 08/21/17 1724      PT SHORT TERM GOAL #1   Title  Pt will be independent with HEP in order to  improve strength and decrease pain balance in order to improve pain-free function at home.    Time  4    Period  Weeks    Status  New    Target Date  09/18/17        PT Long Term Goals - 08/21/17 1725      PT LONG TERM GOAL #1   Title  Pt will increase LEFS by at least 9 points in order to demonstrate significant improvement in lower extremity function.      Baseline  08/21/17: 32/80    Time  6    Period  Weeks    Status  New    Target Date  10/02/17      PT LONG TERM GOAL #2   Title  Pt will decrease mODI scoreby at least 13 points in order demonstrate clinically significant reduction in pain/disability     Baseline  08/21/17: 44%    Time  6    Period  Weeks    Status  New    Target Date  10/02/17      PT LONG TERM GOAL #3   Title  Pt will decrease worst L hip and bilateral pain as reported on NPRS by at least 3 points in order to demonstrate clinically significant reduction in pain.    Baseline  08/21/17: 10/10 for both L hip and bilateral knees    Time  6    Period  Weeks    Status  New    Target Date  10/02/17             Plan - 08/21/17 1720    Clinical Impression Statement  Pt is a pleasant 77 yo female with rheumatoid arthritis which results in bilateral knee pain, posterior L hip pain, bilateral hand pain, bilateral elbow pain. Her pain has worsened since June. She is most bothered by her bilateral knee and L posterior hip pain. She has been receiving acupuncture through [facility] which she feels has been helping. She reports some difficulty with her short term memory and this this is somewhat apparent during history. She describes the pain as "grinding" in the knees and "sharp" in the hip. Knee pain doesn't radiate. She repeats multiple times to therapist that she crepitus in her knees. Left posterior hip pain radiates down to the posterior knee. She sees a rheumatologist who gives her "shots in my sciatic nerve" which helps some. She is unable to recall when  she last had an injection. Denies numbness/tingling in her LEs but rdoes complain of some LE weakness, worse on the left. PT evaluation reveals forward head and rounded shoulder posture. Mild thoracic kyphosis. flattened lower back with decreased lumbar lordosis  noted. Limited knee extension, lacking 1-2 degrees on RLE and 3-5 degrees LLE. Full knee flexion bilaterally but pain with flexion overpressure on RLE. No gross swelling noted in knees. Positive Ely bilaterally for anterior thigh pain and limited muscle length. Positive low back pain with Ely on LLE. All ligamentous laxity testing of bilateral knees are painless and WNL. McMuuray testing negative bilaterally. L patellar hypomobility and crepitus noted with mild discomfort in multiple directions. Lower lumbar CPA are positive for increase in pain and hypomobility. However no reproduction of radiating pain. Good hip IR/ER PROM bilaterally with increase in "sciatic" pain with L hip PROM internal rotation (stretching external rotators). Pt with at least 15 degrees of hip extension bilaterally and no pain when performing active extension. Bilateral hip flexion and extension weakness. Five time sit to stand is 41.8s which is significantly below normative values and places patient at increased risk for falls. However her TUG and 30m gait speed are WNL. Modified ODI is 44% and LEFS is 32/80 both indicating disability related to hip/back and bilateral knee pain. Discussed the possibility of aquatic therapy with patient however she reports that she is very averse to the the water. Hip pain will be examined in more depth at next visit. Pt will benefit from skilled PT services.    History and Personal Factors relevant to plan of care:   3 or more personal factors/comorbidities, 4 or more body systems/activity limitations/participation restrictions    Clinical Presentation  Unstable    Clinical Presentation due to:  highly variable pain    Clinical Decision Making   High    Rehab Potential  Good    PT Frequency  2x / week    PT Duration  6 weeks    PT Treatment/Interventions  Aquatic Therapy;Biofeedback;Cryotherapy;Electrical Stimulation;Iontophoresis 4mg /ml Dexamethasone;Canalith Repostioning;Moist Heat;Traction;Ultrasound;DME Instruction;Gait training;Stair training;Functional mobility training;Therapeutic exercise;Therapeutic activities;Balance training;Neuromuscular re-education;Patient/family education;Manual techniques;Passive range of motion    PT Next Visit Plan  Assess gait, check hip abduction/adduction strength. 6MWT (take vitals), further assessment of "sciatica," assess lumbar ROM, progress bridges to sit to stand exercises if pt tolerates. General LE conditioning.    PT Home Exercise Plan  Single knee to chest stretch 30s hold x 3 bilateral BID, bridges 2 x 10 BID    Consulted and Agree with Plan of Care  Patient       Patient will benefit from skilled therapeutic intervention in order to improve the following deficits and impairments:  Pain, Difficulty walking  Visit Diagnosis: Chronic pain of left knee - Plan: PT plan of care cert/re-cert  Chronic pain of right knee - Plan: PT plan of care cert/re-cert  Pain in left hip - Plan: PT plan of care cert/re-cert     Problem List Patient Active Problem List   Diagnosis Date Noted  . Rheumatoid arthritis ([place]) 07/27/2017  . Chest pain 07/16/2017  . DVT, lower extremity, distal, acute, bilateral ([place]) 02/23/2017  . 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 Counseling regarding end of life decision making 01/14/2015  . Oral candidiasis 10/27/2014  . Short-term memory loss 10/17/2013  . Cerebral vascular disease 01/01/2013  . 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[place])   . PVC's (premature ventricular contractions) 06/03/2012  . Post herpetic neuralgia 10/11/2011  . Osteoarthritis 08/10/2011  .  MYOCARDIAL INFARCTION, HX OF 08/01/2010  . 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 Fibromyalgia 05/20/2008  . Hyperlipidemia 05/29/2007  . OBSTRUCTIVE SLEEP APNEA  05/29/2007  . Essential hypertension, benign 05/29/2007  . ALLERGIC RHINITIS 05/29/2007  . ACNE, ROSACEA 05/29/2007   Phillips Grout PT, DPT   Huprich,Jason 08/21/2017, 5:31 PM  [facility] [address], [place], [address] Phone: [phone]   Fax:  [phone]  Name: AUGUSTA MIRKIN MRN: [id number] Date of Birth: July 02, 1941

## 2017-08-23 ENCOUNTER — Encounter: Payer: Self-pay | Admitting: Physical Therapy

## 2017-08-23 ENCOUNTER — Other Ambulatory Visit: Payer: Self-pay

## 2017-08-23 ENCOUNTER — Ambulatory Visit: Payer: PPO | Admitting: Physical Therapy

## 2017-08-23 DIAGNOSIS — M25561 Pain in right knee: Secondary | ICD-10-CM

## 2017-08-23 DIAGNOSIS — M25562 Pain in left knee: Secondary | ICD-10-CM | POA: Diagnosis not present

## 2017-08-23 DIAGNOSIS — G8929 Other chronic pain: Secondary | ICD-10-CM

## 2017-08-23 DIAGNOSIS — M25552 Pain in left hip: Secondary | ICD-10-CM

## 2017-08-23 NOTE — Therapy (Signed)
[facility] [address] [place], [address] Phone: [phone]   Fax:  [phone]  Physical Therapy Treatment  Patient Details  Name: Kristin [name] MRN: [id number] Date of Birth: 1941-07-17 Referring Provider: Eliezer Lofts   Encounter Date: 08/23/2017  PT End of Session - 08/23/17 1347    Visit Number  2    Number of Visits  13    Date for PT Re-Evaluation  10/02/17    PT Start Time  1344    PT Stop Time  1431    PT Time Calculation (min)  47 min    Equipment Utilized During Treatment  Gait belt    Activity Tolerance  Patient tolerated treatment well    Behavior During Therapy  [facility] for tasks assessed/performed       Past Medical History:  Diagnosis Date  . Arthritis    recent falls -aug 2010  . [name] (coronary artery disease)    mi-1996  . Complication of anesthesia    pt states takes a long time to wake her wake up  . 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 Cough    no fever   . Degeneration of lumbar or lumbosacral intervertebral disc   . Dizziness    r/t meds  . Fibromyalgia   . History of shingles   . [name] (irritable bowel syndrome)    takes OTC Hardin Negus colon health  . Joint pain   . Joint swelling   . Myalgia and myositis, unspecified   . Obstructive sleep apnea (adult) (pediatric)   . Old myocardial infarction 1996  . Osteoarthritis   . Osteoarthrosis, unspecified whether generalized or localized, unspecified site   . Psoriatic arthritis ([place])   . Rheumatoid arthritis ([place])   . Rosacea   . Seasonal allergies   . Stroke [facility])    ischaemic microvascular disease  . 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 Unspecified essential hypertension    takes Diltiazem,Metoprolol,and Losartan daily  . UTI (lower urinary tract infection)    frequent=last one was  winter of 2014    Past Surgical History:  Procedure Laterality Date  . ATHERECTOMY  1996  . [place]   vasospams; no blockage  . CARDIAC CATHETERIZATION  2013   No obstructive coronary artery disease. Mild distal LAD and proximal RCA disease.  [name] CATARACT EXTRACTION Bilateral   . COLONOSCOPY    . CT ABD WO/W & PELVIS WO CM  2013   no acute process, + minimal diverticulosis and hepatic/renal cysts, ATH calcifications  . ETHMOIDECTOMY  1998  . 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 NASAL SINUS SURGERY  1998  . OOPHORECTOMY     BSO  . sinus biopsy  1998   inverted papiloma  . VAGINAL HYSTERECTOMY  1992   for mennorhagia-LAVH BSO    There were no vitals filed for this visit.  Subjective Assessment - 08/23/17 1345    Subjective  Pt reports she feels about the same as last session.  Pt completed her HEP with no questions or concerns. Pt reports she used to use a CPAP several years ago but disontinued it once her last  doctor passed away (pt personally decided to discontinue use).  She has noticed that recently she has been more tired and dizzy which she is now realizing may be due to not using her CPAP.     Pertinent History  Pt reports she has a history of rheumatoid arthritis which results in bilateral knee pain, posterior L hip pain, bilateral hand pain, bilateral elbow pain. Her pain has worsened since June. She is most bothered by her bilateral knee and L posterior hip pain. She has been receiving acupuncture through [place] which she feels has been helping. She tried Methotrexate but was unable to tolerate it due to an itchy rash that developed. Today's session focused on patient's knee pain. Worst knee pain is 10/10, best: 0/10, present: 0/10. Worst hip pain: 10/10, Best: 1/10, Present: 1/10. She  describes the pain as "grinding" in the knees and "sharp" in the hip. Knee pain doesn't radiate. She repeats multiple times the fact that she has crepitus in her knees. L posterior hip pain radiates down to the posterior knee. She sees a rheumatologist who gives her "shots in my sciatic nerve" which helps some. She is unable to recall when she last had an injection. She states that she will follow-up with her rheumatologist in in late January/early February. Denies numbness/tingling in bilateral legs. She complains of bilateral LE weakness, worse on the left. Pain in the knees lasts all day and she doesn't notice any difference in severity from morning to night. Hip pain wakes her up at night but not knee pain. ROS negative for red flags.    Limitations  Walking    How long can you sit comfortably?  30 minutes    How long can you stand comfortably?  10 minutes    How long can you walk comfortably?  5-10 minutes    Diagnostic tests  None related to knees or posterior hip    Patient Stated Goals  Decreased knee pain, "be able to walk farther." Decreased "sciatic nerve pain."    Currently in Pain?  Yes    Pain Score  9     Pain Location  Knee    Pain Orientation  Right;Left    Pain Descriptors / Indicators  -- Grinding    Pain Type  Chronic pain    Pain Onset  More than a month ago    Multiple Pain Sites  Yes    Pain Score  10    Pain Location  Hip    Pain Orientation  Left    Pain Descriptors / Indicators  Sharp    Pain Onset  More than a month ago        TREATMENT  Vitals prior to 669mT: SpO2 100, pulse 77, BP 159/63 678m: 970 ft Vitals after 69m469m SpO2 100%, pulse 73, BP 176/65  Trunk ROM (L, R):  F: 75% with pain in lower thoracic with positive gower's sign  E: 75% with pain in sacral region Rotation: 100%, 100% Lateral flexion: 75%, 50% pain in L sacral and L buttocks region  Hip Strength in sitting (L, R):  Hip abduction: 4/5 Bil Hip adduction: 4-/5, 5/5  Slump Test:  negative Bil     Seated hip adduction with small ball with focus on L adduction.  x8 (pt reports this is aggravating to her nerve pain in her L buttocks so it was discontinued).   Sit<>stand x5 from elevated mat table.  Pt reports pain in Bil knees.  Cues to squeeze  glutes when coming to stand with no decrease in pain.  Discontinued.    Seated isometric Bil hip ER/Abd with belt with 5 second holds x10.  Pt denies pain with this.   Seated theraball rollout forward with 10 second holds x10. Pt denies pain with this.  BLE bridges in supine 2x10.  No reports of pain with this.  Vitals taken at end of session: SpO2: 100, pulse 73, BP 157/67                         PT Education - 08/23/17 1347    Education provided  Yes    Education Details  Exercise technique; encouraged pt to call her MD after appointment about her dizziness and sleepiness since not using CPAP, pt verbalized agreement    Person(s) Educated  Patient    Methods  Explanation;Demonstration;Verbal cues    Comprehension  Verbalized understanding;Returned demonstration;Verbal cues required;Need further instruction       PT Short Term Goals - 08/21/17 1724      PT SHORT TERM GOAL #1   Title  Pt will be independent with HEP in order to improve strength and decrease pain balance in order to improve pain-free function at home.    Time  4    Period  Weeks    Status  New    Target Date  09/18/17        PT Long Term Goals - 08/23/17 1353      PT LONG TERM GOAL #1   Title  Pt will increase LEFS by at least 9 points in order to demonstrate significant improvement in lower extremity function.      Baseline  08/21/17: 32/80    Time  6    Period  Weeks    Status  New      PT LONG TERM GOAL #2   Title  Pt will decrease mODI scoreby at least 13 points in order demonstrate clinically significant reduction in pain/disability     Baseline  08/21/17: 44%    Time  6    Period  Weeks    Status  New      PT  LONG TERM GOAL #3   Title  Pt will decrease worst L hip and bilateral pain as reported on NPRS by at least 3 points in order to demonstrate clinically significant reduction in pain.    Baseline  08/21/17: 10/10 for both L hip and bilateral knees    Time  6    Period  Weeks    Status  New      PT LONG TERM GOAL #4   Title  Pt will improve 16mT distance to at least 1,200 ft to demonstrate improve safety and ambulation in the community setting    Baseline  970 ft    Time  5    Period  Weeks    Status  New            Plan - 08/23/17 1423    Clinical Impression Statement  Pt reports feeling sleeping and dizzy recently and believes there may be a connection between her discontinuing use of her CPAP with these symptoms.  Pt agreed to call her MD following her PT session to inquire about the need to use her CPAP and to ask about her sleepiness and dizziness.  The pt ambulated 970 ft on her 636m demonstrating dec endurance and speed.  Pt demonstrates weakness in L hip adductors  and abductors which was addressed today with therapeutic exercises.  Provided pt with cues throughout session to alter exercises that were painful.  Pt's trunk AROM is limited, specifically into R lateral flexion and will likely benefit from stretching and possibly MET techniques to improve this.  Pt will benefit from continued skilled PT intervention to improve strength, ROM, functional mobilty, and to decrease pain with activity.     Rehab Potential  Good    PT Frequency  2x / week    PT Duration  6 weeks    PT Treatment/Interventions  Aquatic Therapy;Biofeedback;Cryotherapy;Electrical Stimulation;Iontophoresis 25m/ml Dexamethasone;Canalith Repostioning;Moist Heat;Traction;Ultrasound;DME Instruction;Gait training;Stair training;Functional mobility training;Therapeutic exercise;Therapeutic activities;Balance training;Neuromuscular re-education;Patient/family education;Manual techniques;Passive range of motion    PT Next Visit  Plan  Assess gait, further assessment of "sciatica," progress bridges to sit to stand exercises if pt tolerates. General LE conditioning.    PT Home Exercise Plan  Single knee to chest stretch 30s hold x 3 bilateral BID, bridges 2 x 10 BID    Consulted and Agree with Plan of Care  Patient       Patient will benefit from skilled therapeutic intervention in order to improve the following deficits and impairments:  Pain, Difficulty walking  Visit Diagnosis: Chronic pain of left knee  Chronic pain of right knee  Pain in left hip     Problem List Patient Active Problem List   Diagnosis Date Noted  . Rheumatoid arthritis ([place] 07/27/2017  . Chest pain 07/16/2017  . DVT, lower extremity, distal, acute, bilateral ([facility] [place] 02/23/2017  . 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 Counseling regarding end of life decision making 01/14/2015  . Oral candidiasis 10/27/2014  . Short-term memory loss 10/17/2013  . Cerebral vascular disease 01/01/2013  . 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[place]   . PVC's (premature ventricular contractions) 06/03/2012  . Post herpetic neuralgia 10/11/2011  . Osteoarthritis 08/10/2011  . MYOCARDIAL INFARCTION, HX OF 08/01/2010  . 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 Fibromyalgia 05/20/2008  . Hyperlipidemia 05/29/2007  . OBSTRUCTIVE SLEEP APNEA 05/29/2007  . Essential hypertension, benign 05/29/2007  . ALLERGIC RHINITIS 05/29/2007  . ACNE, ROSACEA 05/29/2007    ACollie SiadPT, DPT 08/23/2017, 2:48 PM  [facility] [address] [place] [address]Phone: [phone]  Fax:  [phone] Name: Kristin HOESCHENMRN: [id number] of Birth: [birth year]

## 2017-08-27 ENCOUNTER — Ambulatory Visit: Payer: PPO

## 2017-08-27 DIAGNOSIS — M25562 Pain in left knee: Secondary | ICD-10-CM | POA: Diagnosis not present

## 2017-08-27 DIAGNOSIS — M25561 Pain in right knee: Secondary | ICD-10-CM

## 2017-08-27 DIAGNOSIS — G8929 Other chronic pain: Secondary | ICD-10-CM

## 2017-08-27 NOTE — Therapy (Signed)
[facility] [address], [place], [address] Phone: [phone]   Fax:  [phone]  Physical Therapy Treatment  Patient Details  Name: Kristin [name] MRN: [id number] Date of Birth: 09-14-1940 Referring Provider: Eliezer Lofts   Encounter Date: 08/27/2017  PT End of Session - 08/27/17 1040    Visit Number  3    Number of Visits  13    Date for PT Re-Evaluation  10/02/17    PT Start Time  2409    PT Stop Time  1120    PT Time Calculation (min)  45 min    Equipment Utilized During Treatment  Gait belt    Activity Tolerance  Patient tolerated treatment well    Behavior During Therapy  [facility] for tasks assessed/performed       Past Medical History:  Diagnosis Date  . Arthritis    recent falls -aug 2010  . [name] (coronary artery disease)    mi-1996  . Complication of anesthesia    pt states takes a long time to wake her wake up  . 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 Cough    no fever   . Degeneration of lumbar or lumbosacral intervertebral disc   . Dizziness    r/t meds  . Fibromyalgia   . History of shingles   . [name] (irritable bowel syndrome)    takes OTC Hardin Negus colon health  . Joint pain   . Joint swelling   . Myalgia and myositis, unspecified   . Obstructive sleep apnea (adult) (pediatric)   . Old myocardial infarction 1996  . Osteoarthritis   . Osteoarthrosis, unspecified whether generalized or localized, unspecified site   . Psoriatic arthritis ([place])   . Rheumatoid arthritis ([place])   . Rosacea   . Seasonal allergies   . Stroke [facility])    ischaemic microvascular disease  . 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 Unspecified essential hypertension    takes Diltiazem,Metoprolol,and Losartan daily  . UTI (lower urinary tract infection)    frequent=last one was  winter of 2014    Past Surgical History:  Procedure Laterality Date  . ATHERECTOMY  1996  . [place]   vasospams; no blockage  . CARDIAC CATHETERIZATION  2013   No obstructive coronary artery disease. Mild distal LAD and proximal RCA disease.  [name] CATARACT EXTRACTION Bilateral   . COLONOSCOPY    . CT ABD WO/W & PELVIS WO CM  2013   no acute process, + minimal diverticulosis and hepatic/renal cysts, ATH calcifications  . ETHMOIDECTOMY  1998  . 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 NASAL SINUS SURGERY  1998  . OOPHORECTOMY     BSO  . sinus biopsy  1998   inverted papiloma  . VAGINAL HYSTERECTOMY  1992   for mennorhagia-LAVH BSO    There were no vitals filed for this visit.  Subjective Assessment - 08/27/17 1039    Subjective  Pt reports no change in her symptoms since starting therapy. She complains of some increased pain with the weather being colder. She denies any L posterior hip pain upon arrival.     Pertinent History  Pt reports she has a history of rheumatoid arthritis which results in bilateral knee pain, posterior L hip pain, bilateral hand pain, bilateral elbow pain. Her pain has worsened since June. She is most bothered by her bilateral knee and L posterior hip pain. She has been receiving acupuncture through [place] which she feels has been helping. She tried Methotrexate but was unable to tolerate it due to an itchy rash that developed. Today's session focused on patient's knee pain. Worst knee pain is 10/10, best: 0/10, present: 0/10. Worst hip pain: 10/10, Best: 1/10, Present: 1/10. She describes the pain as "grinding" in the knees and "sharp" in the hip. Knee pain doesn't radiate. She repeats multiple times the fact that she has crepitus in her knees. L posterior hip pain radiates down to  the posterior knee. She sees a rheumatologist who gives her "shots in my sciatic nerve" which helps some. She is unable to recall when she last had an injection. She states that she will follow-up with her rheumatologist in in late January/early February. Denies numbness/tingling in bilateral legs. She complains of bilateral LE weakness, worse on the left. Pain in the knees lasts all day and she doesn't notice any difference in severity from morning to night. Hip pain wakes her up at night but not knee pain. ROS negative for red flags.    Limitations  Walking    How long can you sit comfortably?  30 minutes    How long can you stand comfortably?  10 minutes    How long can you walk comfortably?  5-10 minutes    Diagnostic tests  None related to knees or posterior hip    Patient Stated Goals  Decreased knee pain, "be able to walk farther." Decreased "sciatic nerve pain."    Currently in Pain?  Yes    Pain Score  6     Pain Location  Knee    Pain Orientation  Right;Left    Pain Descriptors / Indicators  -- Grinding    Pain Type  Chronic pain    Pain Onset  More than a month ago    Pain Frequency  Intermittent    Multiple Pain Sites  No          TREATMENT   Ther-ex  Seated hip abduction/ER clams with manual resistance 2 x 10, no pain; Seated hip adduction with manual resistance, pt makes it to 10 reps but then reports increase in low back pain; Seated marches with and without manual resistance, pt makes it to 10 reps but then reports increase in bilateral knee pain so terminated; Attempted sciatic nerve glides in sitting but pt reports a "moderate" amount of pain in her "tibia and knee" so terminated; Hook[place] bridges 2 x 10, modified foot position and pt denies pain with exercise; L single knee to chest stretch 30s hold x 3; Attempted L HS stretch but pt unable to tolerate due to pain; Sidelying clams x 10 bilateral, no pain with this (added to HEP); Seated theraball rollout forward,  L lateral, and R lateral with 3 second holds x 10 each. Pt denies pain; Standing mini squats 2 x 10;                          PT Education - 08/27/17 1040    Education provided  Yes    Education Details  HEP progression, exercise form/technique    Person(s) Educated  Patient    Methods  Explanation    Comprehension  Verbalized understanding       PT Short Term Goals - 08/21/17 1724      PT SHORT TERM GOAL #1   Title  Pt will be independent with HEP in order to improve strength and decrease pain balance in order to improve pain-free function at home.    Time  4    Period  Weeks    Status  New    Target Date  09/18/17        PT Long Term Goals - 08/23/17 1353      PT LONG TERM GOAL #1   Title  Pt will increase LEFS by at least 9 points in order to demonstrate significant improvement in lower extremity function.      Baseline  08/21/17: 32/80    Time  6    Period  Weeks    Status  New      PT LONG TERM GOAL #2   Title  Pt will decrease mODI scoreby at least 13 points in order demonstrate clinically significant reduction in pain/disability     Baseline  08/21/17: 44%    Time  6    Period  Weeks    Status  New      PT LONG TERM GOAL #3   Title  Pt will decrease worst L hip and bilateral pain as reported on NPRS by at least 3 points in order to demonstrate clinically significant reduction in pain.    Baseline  08/21/17: 10/10 for both L hip and bilateral knees    Time  6    Period  Weeks    Status  New      PT LONG TERM GOAL #4   Title  Pt will improve 25mWT distance to at least 1,200 ft to demonstrate improve safety and ambulation in the community setting    Baseline  970 ft    Time  5    Period  Weeks    Status  New            Plan - 08/27/17 1040    Clinical Impression Statement  Patient's pain is highly irritable today. Multiple exercises have to be terminated or modified due to increase in knee or bilateral hip pain. She demonstrates  good form with bridges and knee to chest stretch without increase in knee or hip pain. She is able to perform sidelying clams without increase in pain so added to HEP. Pt does appear to be struggling cognitively slightly today with deficits in memory and very flat affect. Pt encouraged to follow-up as scheduled and continue her additional HEP.     Rehab Potential  Good    PT Frequency  2x / week    PT Duration  6 weeks    PT Treatment/Interventions  Aquatic Therapy;Biofeedback;Cryotherapy;Electrical Stimulation;Iontophoresis 4mg /ml Dexamethasone;Canalith Repostioning;Moist Heat;Traction;Ultrasound;DME Instruction;Gait training;Stair training;Functional mobility training;Therapeutic exercise;Therapeutic activities;Balance training;Neuromuscular re-education;Patient/family education;Manual techniques;Passive range of motion    PT Next Visit Plan  Assess gait, further assessment of "sciatica," progress bridges to sit to stand exercises if pt tolerates. General LE conditioning.    PT Home Exercise Plan  Single knee to chest stretch 30s hold x 3 bilateral BID, bridges 2 x 10 BID    Consulted and Agree with Plan of Care  Patient       Patient will benefit from skilled therapeutic intervention in order to improve the following deficits and impairments:  Pain, Difficulty walking  Visit Diagnosis: Chronic pain of  left knee  Chronic pain of right knee     Problem List Patient Active Problem List   Diagnosis Date Noted  . Rheumatoid arthritis ([place]) 07/27/2017  . Chest pain 07/16/2017  . DVT, lower extremity, distal, acute, bilateral ([place]) 02/23/2017  . 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 Counseling regarding end of life decision making 01/14/2015  . Oral candidiasis 10/27/2014  . Short-term memory loss 10/17/2013  . Cerebral vascular disease 01/01/2013  . 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[place])    . PVC's (premature ventricular contractions) 06/03/2012  . Post herpetic neuralgia 10/11/2011  . Osteoarthritis 08/10/2011  . MYOCARDIAL INFARCTION, HX OF 08/01/2010  . 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 Fibromyalgia 05/20/2008  . Hyperlipidemia 05/29/2007  . OBSTRUCTIVE SLEEP APNEA 05/29/2007  . Essential hypertension, benign 05/29/2007  . ALLERGIC RHINITIS 05/29/2007  . ACNE, ROSACEA 05/29/2007   Phillips Grout PT, DPT   Mackenzie Groom 08/27/2017, 4:09 PM  [facility] [address], [place], [address] Phone: [phone]   Fax:  [phone]  Name: Kristin [name] MRN: [id number] Date of Birth: 1941-06-08

## 2017-08-30 ENCOUNTER — Ambulatory Visit: Payer: PPO | Admitting: Physical Therapy

## 2017-08-30 DIAGNOSIS — M25562 Pain in left knee: Secondary | ICD-10-CM | POA: Diagnosis not present

## 2017-08-30 DIAGNOSIS — G8929 Other chronic pain: Secondary | ICD-10-CM

## 2017-08-30 DIAGNOSIS — M25552 Pain in left hip: Secondary | ICD-10-CM

## 2017-08-30 DIAGNOSIS — M25561 Pain in right knee: Secondary | ICD-10-CM

## 2017-08-30 NOTE — Therapy (Signed)
[facility] [address] [place], [address] Phone: [phone]   Fax:  [phone]  Physical Therapy Treatment  Patient Details  Name: Kristin [name] MRN: [id number] Date of Birth: 24-Jun-1941 Referring Provider: Eliezer Lofts   Encounter Date: 08/30/2017  PT End of Session - 08/30/17 0919    Visit Number  4    Number of Visits  13    Date for PT Re-Evaluation  10/02/17    PT Start Time  0830    PT Stop Time  0915    PT Time Calculation (min)  45 min    Activity Tolerance  Patient tolerated treatment well    Behavior During Therapy  [facility] for tasks assessed/performed       Past Medical History:  Diagnosis Date  . Arthritis    recent falls -aug 2010  . [name] (coronary artery disease)    mi-1996  . Complication of anesthesia    pt states takes a long time to wake her wake up  . 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 Cough    no fever   . Degeneration of lumbar or lumbosacral intervertebral disc   . Dizziness    r/t meds  . Fibromyalgia   . History of shingles   . [name] (irritable bowel syndrome)    takes OTC Hardin Negus colon health  . Joint pain   . Joint swelling   . Myalgia and myositis, unspecified   . Obstructive sleep apnea (adult) (pediatric)   . Old myocardial infarction 1996  . Osteoarthritis   . Osteoarthrosis, unspecified whether generalized or localized, unspecified site   . Psoriatic arthritis ([place])   . Rheumatoid arthritis ([place])   . Rosacea   . Seasonal allergies   . Stroke [facility])    ischaemic microvascular disease  . 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 Unspecified essential hypertension    takes Diltiazem,Metoprolol,and Losartan daily  . UTI (lower urinary tract infection)    frequent=last one was winter of 2014    Past Surgical History:  Procedure  Laterality Date  . ATHERECTOMY  1996  . [place]   vasospams; no blockage  . CARDIAC CATHETERIZATION  2013   No obstructive coronary artery disease. Mild distal LAD and proximal RCA disease.  [name] CATARACT EXTRACTION Bilateral   . COLONOSCOPY    . CT ABD WO/W & PELVIS WO CM  2013   no acute process, + minimal diverticulosis and hepatic/renal cysts, ATH calcifications  . ETHMOIDECTOMY  1998  . 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 NASAL SINUS SURGERY  1998  . OOPHORECTOMY     BSO  . sinus biopsy  1998   inverted papiloma  . VAGINAL HYSTERECTOMY  1992   for mennorhagia-LAVH BSO    There were no vitals filed for this visit.  Subjective Assessment - 08/30/17 0835    Subjective  Pt reports no change since her last visit.  She states she usually gets on her hands and knees to mop the floor but that has been difficult lately due ot her knee pain.      Pertinent History  Pt reports she has a  history of rheumatoid arthritis which results in bilateral knee pain, posterior L hip pain, bilateral hand pain, bilateral elbow pain. Her pain has worsened since June. She is most bothered by her bilateral knee and L posterior hip pain. She has been receiving acupuncture through [place] which she feels has been helping. She tried Methotrexate but was unable to tolerate it due to an itchy rash that developed. Today's session focused on patient's knee pain. Worst knee pain is 10/10, best: 0/10, present: 0/10. Worst hip pain: 10/10, Best: 1/10, Present: 1/10. She describes the pain as "grinding" in the knees and "sharp" in the hip. Knee pain doesn't radiate. She repeats multiple times the fact that she has crepitus in her knees. L posterior hip pain radiates down to the posterior knee. She sees a rheumatologist who gives her  "shots in my sciatic nerve" which helps some. She is unable to recall when she last had an injection. She states that she will follow-up with her rheumatologist in in late January/early February. Denies numbness/tingling in bilateral legs. She complains of bilateral LE weakness, worse on the left. Pain in the knees lasts all day and she doesn't notice any difference in severity from morning to night. Hip pain wakes her up at night but not knee pain. ROS negative for red flags.    Limitations  Walking    How long can you sit comfortably?  30 minutes    How long can you stand comfortably?  10 minutes    How long can you walk comfortably?  5-10 minutes    Diagnostic tests  None related to knees or posterior hip    Patient Stated Goals  Decreased knee pain, "be able to walk farther." Decreased "sciatic nerve pain."    Currently in Pain?  Yes    Pain Score  4     Pain Location  Knee    Pain Orientation  Right;Left    Pain Type  Chronic pain    Pain Onset  More than a month ago    Pain Frequency  Intermittent    Aggravating Factors   moving; standing; sitting for prolonged period    Pain Relieving Factors  Rest; Biofreeze    Pain Onset  More than a month ago        PT Treatment:  SAQ with 2# B 2x10; Bridging with TA squeeze and hip add ball squeeze 2x10; Hip abd/ER with GTB in hooklying 20x; 2x10 seated HS curls with GTB B; 2x10 alt hip marching in sitting with 2# ankle wt B; 6" step ups alternating leading foot 2x10 B; Mini squats with TA squeeze and green Swiss Ball at L/S 2x10 with cueing for correct technique; 20x B heel/toe raises at bar; 2x10 standing hip extension B SLR with 2# ankle wts.                      PT Education - 08/30/17 [phone]    Education provided  Yes    Education Details  HEP progression; ankle wts at home and TA squeeze with LE ther ex's to improve core stabilization    Person(s) Educated  Patient    Methods  Explanation;Demonstration    Comprehension   Verbalized understanding;Returned demonstration       PT Short Term Goals - 08/30/17 0923      PT SHORT TERM GOAL #1   Title  Pt will be independent with HEP in order to improve strength and decrease pain balance in  order to improve pain-free function at home.    Time  4    Period  Weeks    Status  New        PT Long Term Goals - 08/30/17 1610      PT LONG TERM GOAL #1   Title  Pt will increase LEFS by at least 9 points in order to demonstrate significant improvement in lower extremity function.      Baseline  08/21/17: 32/80    Time  6    Period  Weeks    Status  New      PT LONG TERM GOAL #2   Title  Pt will decrease mODI scoreby at least 13 points in order demonstrate clinically significant reduction in pain/disability     Baseline  08/21/17: 44%    Time  6    Period  Weeks    Status  New      PT LONG TERM GOAL #3   Title  Pt will decrease worst L hip and bilateral pain as reported on NPRS by at least 3 points in order to demonstrate clinically significant reduction in pain.    Baseline  08/21/17: 10/10 for both L hip and bilateral knees    Time  6    Period  Weeks    Status  New      PT LONG TERM GOAL #4   Title  Pt will improve 7mWT distance to at least 1,200 ft to demonstrate improve safety and ambulation in the community setting    Baseline  970 ft    Time  5    Period  Weeks    Status  New            Plan - 08/30/17 0920    Clinical Impression Statement  Pt had better tolerance of ther ex's at today's visit than at last visit.  She did not have to terminate any ther ex's due to knee pain today.  We discussed adding mini squats with TA squeeze and Swiss Ball at L/S to HEP, as well as standing SLR ex's at kitchen counter with 1-2# ankle wts at home.  Pt reported she had less crepitus with ther ex's today.  Cotinue to progress LE strengthening ex's as tolerated at next visit.    Clinical Presentation  Stable    Clinical Decision Making  High    Rehab Potential   Good    PT Frequency  2x / week    PT Duration  6 weeks    PT Treatment/Interventions  Aquatic Therapy;Biofeedback;Cryotherapy;Electrical Stimulation;Iontophoresis 4mg /ml Dexamethasone;Canalith Repostioning;Moist Heat;Traction;Ultrasound;DME Instruction;Gait training;Stair training;Functional mobility training;Therapeutic exercise;Therapeutic activities;Balance training;Neuromuscular re-education;Patient/family education;Manual techniques;Passive range of motion    PT Next Visit Plan  Continue to progress LE strength ther ex's as tolerated at next visit    PT Home Exercise Plan  Single knee to chest stretch 30s hold x 3 bilateral BID, bridges 2 x 10 BID; standing mini squats and hip / SLR with 1-2# ankle wt    Consulted and Agree with Plan of Care  Patient       Patient will benefit from skilled therapeutic intervention in order to improve the following deficits and impairments:  Pain, Difficulty walking  Visit Diagnosis: Chronic pain of left knee  Chronic pain of right knee  Pain in left hip     Problem List Patient Active Problem List   Diagnosis Date Noted  . Rheumatoid arthritis ([place]) 07/27/2017  . Chest pain 07/16/2017  .  DVT, lower extremity, distal, acute, bilateral ([place]) 02/23/2017  . 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 Counseling regarding end of life decision making 01/14/2015  . Oral candidiasis 10/27/2014  . Short-term memory loss 10/17/2013  . Cerebral vascular disease 01/01/2013  . 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[place])   . PVC's (premature ventricular contractions) 06/03/2012  . Post herpetic neuralgia 10/11/2011  . Osteoarthritis 08/10/2011  . MYOCARDIAL INFARCTION, HX OF 08/01/2010  . 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 Fibromyalgia 05/20/2008  . Hyperlipidemia 05/29/2007  . OBSTRUCTIVE SLEEP APNEA 05/29/2007  . Essential hypertension, benign  05/29/2007  . ALLERGIC RHINITIS 05/29/2007  . ACNE, ROSACEA 05/29/2007    Shalom Mcguiness, MPT 08/30/2017, 9:25 AM  [facility] [address] [place], [address] Phone: [phone]   Fax:  [phone]  Name: AMARIZ FLAMENCO MRN: [id number] Date of Birth: 04/05/1941

## 2017-09-04 ENCOUNTER — Ambulatory Visit: Payer: PPO | Admitting: Physical Therapy

## 2017-09-04 DIAGNOSIS — M25561 Pain in right knee: Secondary | ICD-10-CM

## 2017-09-04 DIAGNOSIS — M25552 Pain in left hip: Secondary | ICD-10-CM

## 2017-09-04 DIAGNOSIS — G8929 Other chronic pain: Secondary | ICD-10-CM

## 2017-09-04 DIAGNOSIS — M25562 Pain in left knee: Principal | ICD-10-CM

## 2017-09-04 NOTE — Therapy (Signed)
[facility] [address], [place], [address] Phone: [phone]   Fax:  [phone]  Physical Therapy Treatment  Patient Details  Name: Kristin [name] MRN: [id number] Date of Birth: [birth year] Referring Provider: Eliezer Lofts   Encounter Date: 09/04/2017  PT End of Session - 09/04/17 1539    Visit Number  5    Number of Visits  13    Date for PT Re-Evaluation  10/02/17    PT Start Time  1520    PT Stop Time  1600    PT Time Calculation (min)  40 min    Activity Tolerance  Patient tolerated treatment well    Behavior During Therapy  [facility] for tasks assessed/performed       Past Medical History:  Diagnosis Date  . Arthritis    recent falls -aug 2010  . [name] (coronary artery disease)    mi-1996  . Complication of anesthesia    pt states takes a long time to wake her wake up  . 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 Cough    no fever   . Degeneration of lumbar or lumbosacral intervertebral disc   . Dizziness    r/t meds  . Fibromyalgia   . History of shingles   . [name] (irritable bowel syndrome)    takes OTC Hardin Negus colon health  . Joint pain   . Joint swelling   . Myalgia and myositis, unspecified   . Obstructive sleep apnea (adult) (pediatric)   . Old myocardial infarction 1996  . Osteoarthritis   . Osteoarthrosis, unspecified whether generalized or localized, unspecified site   . Psoriatic arthritis ([place])   . Rheumatoid arthritis ([place])   . Rosacea   . Seasonal allergies   . Stroke [facility])    ischaemic microvascular disease  . 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 Unspecified essential hypertension    takes Diltiazem,Metoprolol,and Losartan daily  . UTI (lower urinary tract infection)    frequent=last one was winter of 2014    Past Surgical History:  Procedure  Laterality Date  . ATHERECTOMY  1996  . [place]   vasospams; no blockage  . CARDIAC CATHETERIZATION  2013   No obstructive coronary artery disease. Mild distal LAD and proximal RCA disease.  [name] CATARACT EXTRACTION Bilateral   . COLONOSCOPY    . CT ABD WO/W & PELVIS WO CM  2013   no acute process, + minimal diverticulosis and hepatic/renal cysts, ATH calcifications  . ETHMOIDECTOMY  1998  . 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 NASAL SINUS SURGERY  1998  . OOPHORECTOMY     BSO  . sinus biopsy  1998   inverted papiloma  . VAGINAL HYSTERECTOMY  1992   for mennorhagia-LAVH BSO    There were no vitals filed for this visit.  Subjective Assessment - 09/04/17 1524    Subjective  Pt reports she is feeling sore today after cleaning out the closet. Her HEP is 'coming along'. She says that her sciatica is sharp and burning (points to her left posterior iliac crest area, TTP.) HEP is     Pertinent History  Pt reports she  has a history of rheumatoid arthritis which results in bilateral knee pain, posterior L hip pain, bilateral hand pain, bilateral elbow pain. Her pain has worsened since June. She is most bothered by her bilateral knee and L posterior hip pain. She has been receiving acupuncture through [facility] which she feels has been helping. She tried Methotrexate but was unable to tolerate it due to an itchy rash that developed. Today's session focused on patient's knee pain. Worst knee pain is 10/10, best: 0/10, present: 0/10. Worst hip pain: 10/10, Best: 1/10, Present: 1/10. She describes the pain as "grinding" in the knees and "sharp" in the hip. Knee pain doesn't radiate. She repeats multiple times the fact that she has crepitus in her knees. L posterior hip pain radiates down to the posterior knee. She sees a  rheumatologist who gives her "shots in my sciatic nerve" which helps some. She is unable to recall when she last had an injection. She states that she will follow-up with her rheumatologist in in late January/early February. Denies numbness/tingling in bilateral legs. She complains of bilateral LE weakness, worse on the left. Pain in the knees lasts all day and she doesn't notice any difference in severity from morning to night. Hip pain wakes her up at night but not knee pain. ROS negative for red flags.    Currently in Pain?  Yes    Pain Score  4     Pain Location  -- Left posterior iliac crest/gluteal area    Pain Orientation  Left    Pain Descriptors / Indicators  Sharp;Burning             TREATMENT THIS DATE: Therex:  -Seated marching, 2x10 bilat  -Seated LAQ, 2x10  -Hooklying SLR: 2x10 bilat -Hooklying SAQ c 2lb cuff weights, 3x10 bilat -Hooklying Bridge 2x10, VC for knees and feet 8-10 inches wide, >90 degrees hip flexion   Myofascial Release: -Lateral and posterior glute medius x 10  minutes, left side  -8 points of treatment based on aggravation of CC, pressure to near maximal tolerance with sustained hold until release of tissue and/or decrease of NPRS of 2 or more points. Many areas resolving from 10/10 to 6/10.  -Pt reports  decreased pain at rest and in weight bearing after treatment.         PT Short Term Goals - 08/30/17 5643      PT SHORT TERM GOAL #1   Title  Pt will be independent with HEP in order to improve strength and decrease pain balance in order to improve pain-free function at home.    Time  4    Period  Weeks    Status  New        PT Long Term Goals - 08/30/17 3295      PT LONG TERM GOAL #1   Title  Pt will increase LEFS by at least 9 points in order to demonstrate significant improvement in lower extremity function.      Baseline  08/21/17: 32/80    Time  6    Period  Weeks    Status  New      PT LONG TERM GOAL #2   Title  Pt will  decrease mODI scoreby at least 13 points in order demonstrate clinically significant reduction in pain/disability     Baseline  08/21/17: 44%    Time  6    Period  Weeks    Status  New      PT LONG  TERM GOAL #3   Title  Pt will decrease worst L hip and bilateral pain as reported on NPRS by at least 3 points in order to demonstrate clinically significant reduction in pain.    Baseline  08/21/17: 10/10 for both L hip and bilateral knees    Time  6    Period  Weeks    Status  New      PT LONG TERM GOAL #4   Title  Pt will improve 49mWT distance to at least 1,200 ft to demonstrate improve safety and ambulation in the community setting    Baseline  970 ft    Time  5    Period  Weeks    Status  New            Plan - 09/04/17 1540    Clinical Impression Statement  Continued to progress hip strengthening with care to avoid aggravation of acutely exacerbated Left hip pain. Pt tolerating session well, appropriate to available strength levels. Ended session with investigation of CC in pain with response more akin to glute med myofascial pain syndrome and less consistent with sciatica, d/t area of involvment, quality of pain, response to myofascial release, and aggravation with clamshell. Education given to patient on diference between sciatic pain and glute med/min pain. Improved pain at end of session both in weight bearing adn supine.     Rehab Potential  Good    PT Frequency  2x / week    PT Duration  6 weeks    PT Treatment/Interventions  Aquatic Therapy;Biofeedback;Cryotherapy;Electrical Stimulation;Iontophoresis 4mg /ml Dexamethasone;Canalith Repostioning;Moist Heat;Traction;Ultrasound;DME Instruction;Gait training;Stair training;Functional mobility training;Therapeutic exercise;Therapeutic activities;Balance training;Neuromuscular re-education;Patient/family education;Manual techniques;Passive range of motion    PT Next Visit Plan  Continue to progress LE strength ther ex's as tolerated at  next visit    PT Home Exercise Plan  Single knee to chest stretch 30s hold x 3 bilateral BID, bridges 2 x 10 BID; standing mini squats and hip / SLR with 1-2# ankle wt    Consulted and Agree with Plan of Care  Patient       Patient will benefit from skilled therapeutic intervention in order to improve the following deficits and impairments:  Pain, Difficulty walking  Visit Diagnosis: Chronic pain of left knee  Chronic pain of right knee  Pain in left hip     Problem List Patient Active Problem List   Diagnosis Date Noted  . Rheumatoid arthritis ([place]) 07/27/2017  . Chest pain 07/16/2017  . DVT, lower extremity, distal, acute, bilateral ([place]) 02/23/2017  . 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 Counseling regarding end of life decision making 01/14/2015  . Oral candidiasis 10/27/2014  . Short-term memory loss 10/17/2013  . Cerebral vascular disease 01/01/2013  . 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[place])   . PVC's (premature ventricular contractions) 06/03/2012  . Post herpetic neuralgia 10/11/2011  . Osteoarthritis 08/10/2011  . MYOCARDIAL INFARCTION, HX OF 08/01/2010  . 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 Fibromyalgia 05/20/2008  . Hyperlipidemia 05/29/2007  . OBSTRUCTIVE SLEEP APNEA 05/29/2007  . Essential hypertension, benign 05/29/2007  . ALLERGIC RHINITIS 05/29/2007  . ACNE, ROSACEA 05/29/2007   4:09 PM, 09/04/17 Etta Grandchild, PT, DPT [facility] [place] [phone] (Office)   Lateshia Schmoker C 09/04/2017, 4:09 PM  [facility] [address] [place], [address] Phone: [phone]   Fax:  [phone]  Name: CHRISHAWN KRING MRN: [id number] Date of Birth: October 05, 1940

## 2017-09-06 ENCOUNTER — Ambulatory Visit: Payer: PPO | Admitting: Physical Therapy

## 2017-09-06 ENCOUNTER — Encounter: Payer: Self-pay | Admitting: Physical Therapy

## 2017-09-06 DIAGNOSIS — G8929 Other chronic pain: Secondary | ICD-10-CM

## 2017-09-06 DIAGNOSIS — M25562 Pain in left knee: Principal | ICD-10-CM

## 2017-09-06 NOTE — Therapy (Signed)
[facility] [address] [place], [address] Phone: [phone]   Fax:  [phone]  Physical Therapy Treatment  Patient Details  Name: Kristin [name] MRN: [id number] Date of Birth: Aug 15, 1940 Referring Provider: Eliezer Lofts   Encounter Date: 09/06/2017  PT End of Session - 09/06/17 1716    Visit Number  6    Number of Visits  13    Date for PT Re-Evaluation  10/02/17    PT Start Time  0445    PT Stop Time  0530    PT Time Calculation (min)  45 min    Activity Tolerance  Patient tolerated treatment well    Behavior During Therapy  [facility] for tasks assessed/performed       Past Medical History:  Diagnosis Date  . Arthritis    recent falls -aug 2010  . [name] (coronary artery disease)    mi-1996  . Complication of anesthesia    pt states takes a long time to wake her wake up  . 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 Cough    no fever   . Degeneration of lumbar or lumbosacral intervertebral disc   . Dizziness    r/t meds  . Fibromyalgia   . History of shingles   . [name] (irritable bowel syndrome)    takes OTC Hardin Negus colon health  . Joint pain   . Joint swelling   . Myalgia and myositis, unspecified   . Obstructive sleep apnea (adult) (pediatric)   . Old myocardial infarction 1996  . Osteoarthritis   . Osteoarthrosis, unspecified whether generalized or localized, unspecified site   . Psoriatic arthritis ([place])   . Rheumatoid arthritis ([place])   . Rosacea   . Seasonal allergies   . Stroke [facility])    ischaemic microvascular disease  . 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 Unspecified essential hypertension    takes Diltiazem,Metoprolol,and Losartan daily  . UTI (lower urinary tract infection)    frequent=last one was winter of 2014    Past Surgical History:  Procedure  Laterality Date  . ATHERECTOMY  1996  . [place]   vasospams; no blockage  . CARDIAC CATHETERIZATION  2013   No obstructive coronary artery disease. Mild distal LAD and proximal RCA disease.  [name] CATARACT EXTRACTION Bilateral   . COLONOSCOPY    . CT ABD WO/W & PELVIS WO CM  2013   no acute process, + minimal diverticulosis and hepatic/renal cysts, ATH calcifications  . ETHMOIDECTOMY  1998  . 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 NASAL SINUS SURGERY  1998  . OOPHORECTOMY     BSO  . sinus biopsy  1998   inverted papiloma  . VAGINAL HYSTERECTOMY  1992   for mennorhagia-LAVH BSO    There were no vitals filed for this visit.  Subjective Assessment - 09/06/17 1654    Subjective  Pt reports significant pain relief (3/10) since manual therapy Tuesday. She reported this is the best her pain has been and she attributes this to the "massage" on Tuesday.     Pertinent History  Pt reports she has a history of rheumatoid arthritis which results  in bilateral knee pain, posterior L hip pain, bilateral hand pain, bilateral elbow pain. Her pain has worsened since June. She is most bothered by her bilateral knee and L posterior hip pain. She has been receiving acupuncture through [facility] which she feels has been helping. She tried Methotrexate but was unable to tolerate it due to an itchy rash that developed. Today's session focused on patient's knee pain. Worst knee pain is 10/10, best: 0/10, present: 0/10. Worst hip pain: 10/10, Best: 1/10, Present: 1/10. She describes the pain as "grinding" in the knees and "sharp" in the hip. Knee pain doesn't radiate. She repeats multiple times the fact that she has crepitus in her knees. L posterior hip pain radiates down to the posterior knee. She sees a rheumatologist who gives her  "shots in my sciatic nerve" which helps some. She is unable to recall when she last had an injection. She states that she will follow-up with her rheumatologist in in late January/early February. Denies numbness/tingling in bilateral legs. She complains of bilateral LE weakness, worse on the left. Pain in the knees lasts all day and she doesn't notice any difference in severity from morning to night. Hip pain wakes her up at night but not knee pain. ROS negative for red flags.    Limitations  Walking    How long can you sit comfortably?  30 minutes    How long can you stand comfortably?  10 minutes    How long can you walk comfortably?  5-10 minutes    Diagnostic tests  None related to knees or posterior hip    Patient Stated Goals  Decreased knee pain, "be able to walk farther." Decreased "sciatic nerve pain."    Currently in Pain?  Yes    Pain Score  3     Pain Orientation  Left    Pain Descriptors / Indicators  Burning    Pain Type  Chronic pain    Pain Onset  More than a month ago             Manual - (per pt request for pain) L glute med and max STM and Trigger point release with noted tissue release following 10min -Manual L knee to chest and R shoulder glute stretch 45sec holds x3  Ther-Ex -Supine bridges w/ yellow tband at knees promoting hip abd 3x 10 -Bilat clamshell w/ yellow tband 3x 10 -SAQ w/ 3x10  (patient reports she does not like ankle wts) -Bilat ConSLR 3x10 w/ cuing for quad contraction to maintain extended knee and for eccentric control                   PT Education - 09/06/17 1714    Education provided  Yes    Education Details  Hep progression and review - added clamshell exercise    Person(s) Educated  Patient    Methods  Demonstration    Comprehension  Verbalized understanding       PT Short Term Goals - 08/30/17 0923      PT SHORT TERM GOAL #1   Title  Pt will be independent with HEP in order to improve strength and decrease pain  balance in order to improve pain-free function at home.    Time  4    Period  Weeks    Status  New        PT Long Term Goals - 08/30/17 0923      PT LONG TERM GOAL #1  Title  Pt will increase LEFS by at least 9 points in order to demonstrate significant improvement in lower extremity function.      Baseline  08/21/17: 32/80    Time  6    Period  Weeks    Status  New      PT LONG TERM GOAL #2   Title  Pt will decrease mODI scoreby at least 13 points in order demonstrate clinically significant reduction in pain/disability     Baseline  08/21/17: 44%    Time  6    Period  Weeks    Status  New      PT LONG TERM GOAL #3   Title  Pt will decrease worst L hip and bilateral pain as reported on NPRS by at least 3 points in order to demonstrate clinically significant reduction in pain.    Baseline  08/21/17: 10/10 for both L hip and bilateral knees    Time  6    Period  Weeks    Status  New      PT LONG TERM GOAL #4   Title  Pt will improve 8mWT distance to at least 1,200 ft to demonstrate improve safety and ambulation in the community setting    Baseline  970 ft    Time  5    Period  Weeks    Status  New            Plan - 09/06/17 1720    Clinical Impression Statement  Continued to progress therex based off patient's tolerance. Patient responded well to manual therapy reporting pain relief to 0/10 pain. Patient reported no pain throughout session, only muscle fatigue at the end of sets.    Clinical Presentation  Stable    Clinical Decision Making  High    Rehab Potential  Good    PT Frequency  2x / week    PT Duration  6 weeks    PT Treatment/Interventions  Aquatic Therapy;Biofeedback;Cryotherapy;Electrical Stimulation;Iontophoresis 4mg /ml Dexamethasone;Canalith Repostioning;Moist Heat;Traction;Ultrasound;DME Instruction;Gait training;Stair training;Functional mobility training;Therapeutic exercise;Therapeutic activities;Balance training;Neuromuscular re-education;Patient/family  education;Manual techniques;Passive range of motion    PT Next Visit Plan  Continue to progress LE strength ther ex's as tolerated at next visit    PT Home Exercise Plan  Single knee to chest stretch 30s hold x 3 bilateral BID, bridges 2 x 10 BID; standing mini squats and hip / SLR with 1-2# ankle wt    Consulted and Agree with Plan of Care  Patient       Patient will benefit from skilled therapeutic intervention in order to improve the following deficits and impairments:  Pain, Difficulty walking  Visit Diagnosis: Chronic pain of left knee     Problem List Patient Active Problem List   Diagnosis Date Noted  . Rheumatoid arthritis ([place]) 07/27/2017  . Chest pain 07/16/2017  . DVT, lower extremity, distal, acute, bilateral ([place]) 02/23/2017  . 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) 08/27/2015  . Counseling regarding end of life decision making 01/14/2015  . Oral candidiasis 10/27/2014  . Short-term memory loss 10/17/2013  . Cerebral vascular disease 01/01/2013  . 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[place])   . PVC's (premature ventricular contractions) 06/03/2012  . Post herpetic neuralgia 10/11/2011  . Osteoarthritis 08/10/2011  . MYOCARDIAL INFARCTION, HX OF 08/01/2010  . 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 Fibromyalgia 05/20/2008  . Hyperlipidemia 05/29/2007  . OBSTRUCTIVE SLEEP APNEA 05/29/2007  . Essential  hypertension, benign 05/29/2007  . ALLERGIC RHINITIS 05/29/2007  . ACNE, ROSACEA 05/29/2007    Shelton Silvas PT, DPT  Shelton Silvas 09/06/2017, 5:29 PM  [place] [facility] [address] [place], [address] Phone: [phone]   Fax:  [phone]  Name: Kristin [name] MRN: [id number] Date of Birth: [birth year]

## 2017-09-10 ENCOUNTER — Ambulatory Visit: Payer: PPO | Admitting: Physical Therapy

## 2017-09-10 ENCOUNTER — Encounter: Payer: Self-pay | Admitting: Physical Therapy

## 2017-09-10 DIAGNOSIS — M25562 Pain in left knee: Secondary | ICD-10-CM | POA: Diagnosis not present

## 2017-09-10 DIAGNOSIS — G8929 Other chronic pain: Secondary | ICD-10-CM

## 2017-09-10 DIAGNOSIS — M25561 Pain in right knee: Secondary | ICD-10-CM

## 2017-09-10 NOTE — Therapy (Signed)
[place] [address] [place], [address] Phone: [phone]   Fax:  [phone]  Physical Therapy Treatment  Patient Details  Name: Kristin [name] MRN: [id number] Date of Birth: 1941/04/10 Referring Provider: Eliezer Lofts   Encounter Date: 09/10/2017  PT End of Session - 09/10/17 1701    Visit Number  7    Number of Visits  13    Date for PT Re-Evaluation  10/02/17    PT Start Time  0315    PT Stop Time  0400    PT Time Calculation (min)  45 min    Activity Tolerance  Patient tolerated treatment well    Behavior During Therapy  [facility] for tasks assessed/performed       Past Medical History:  Diagnosis Date  . Arthritis    recent falls -aug 2010  . [name] (coronary artery disease)    mi-1996  . Complication of anesthesia    pt states takes a long time to wake her wake up  . 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 Cough    no fever   . Degeneration of lumbar or lumbosacral intervertebral disc   . Dizziness    r/t meds  . Fibromyalgia   . History of shingles   . [name] (irritable bowel syndrome)    takes OTC Hardin Negus colon health  . Joint pain   . Joint swelling   . Myalgia and myositis, unspecified   . Obstructive sleep apnea (adult) (pediatric)   . Old myocardial infarction 1996  . Osteoarthritis   . Osteoarthrosis, unspecified whether generalized or localized, unspecified site   . Psoriatic arthritis ([place])   . Rheumatoid arthritis ([place])   . Rosacea   . Seasonal allergies   . Stroke [facility])    ischaemic microvascular disease  . 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 Unspecified essential hypertension    takes Diltiazem,Metoprolol,and Losartan daily  . UTI (lower urinary tract infection)    frequent=last one was winter of 2014    Past Surgical History:  Procedure  Laterality Date  . ATHERECTOMY  1996  . [place]   vasospams; no blockage  . CARDIAC CATHETERIZATION  2013   No obstructive coronary artery disease. Mild distal LAD and proximal RCA disease.  [name] CATARACT EXTRACTION Bilateral   . COLONOSCOPY    . CT ABD WO/W & PELVIS WO CM  2013   no acute process, + minimal diverticulosis and hepatic/renal cysts, ATH calcifications  . ETHMOIDECTOMY  1998  . 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 NASAL SINUS SURGERY  1998  . OOPHORECTOMY     BSO  . sinus biopsy  1998   inverted papiloma  . VAGINAL HYSTERECTOMY  1992   for mennorhagia-LAVH BSO    There were no vitals filed for this visit.  Subjective Assessment - 09/10/17 1529    Subjective  Patient reports her pain over the weekend only increased to a 5/10 going up and down the stairs in her basement, but that prior to PT that activity had caused 10/10 pain. She reports manual therapy is making her somewhat sore, but that she does think it  is giving her pain relief. Patient reports compliance with her HEP with no pain, only muscle soreness.    Pertinent History  Pt reports she has a history of rheumatoid arthritis which results in bilateral knee pain, posterior L hip pain, bilateral hand pain, bilateral elbow pain. Her pain has worsened since June. She is most bothered by her bilateral knee and L posterior hip pain. She has been receiving acupuncture through [facility] which she feels has been helping. She tried Methotrexate but was unable to tolerate it due to an itchy rash that developed. Today's session focused on patient's knee pain. Worst knee pain is 10/10, best: 0/10, present: 0/10. Worst hip pain: 10/10, Best: 1/10, Present: 1/10. She describes the pain as "grinding" in the knees and "sharp" in the hip. Knee pain  doesn't radiate. She repeats multiple times the fact that she has crepitus in her knees. L posterior hip pain radiates down to the posterior knee. She sees a rheumatologist who gives her "shots in my sciatic nerve" which helps some. She is unable to recall when she last had an injection. She states that she will follow-up with her rheumatologist in in late January/early February. Denies numbness/tingling in bilateral legs. She complains of bilateral LE weakness, worse on the left. Pain in the knees lasts all day and she doesn't notice any difference in severity from morning to night. Hip pain wakes her up at night but not knee pain. ROS negative for red flags.    Limitations  Walking    How long can you sit comfortably?  30 minutes    How long can you stand comfortably?  10 minutes    Pain Onset  More than a month ago          Manual -Glute med. STM and Trigger point release with noted decreased tautness and pt report of decreased pain -Manual supine R hamstring stretch 3x 45sec holds -Manual supine R glute stretch (R knee to L shoulder) 3x 45sec holds   Ther-Ex -Standing bilat hip abd 3x 10 each  -Standing bilat hip abd 3x 10 each   Patient suffered from dizziness today that had to be continuously monitored throughout therex activity. Dizziness subsided from exercise to exercise following prolonged breaks. Patients BP was monitored throughout session and was recorded at:  -while sitting following hip abd 166/60 -While sitting 3 min following hip ext 163/50 to 151/57 upon standing  Patient reported extreme fatigue during session as well. When asked about meal prior to therapy session patient replied she "may have only eaten a yogurt and had not drank much water".                    PT Education - 09/10/17 1701    Education provided  Yes    Education Details  Importance of specifically extension stretching    Person(s) Educated  Patient    Methods  Demonstration     Comprehension  Verbalized understanding       PT Short Term Goals - 08/30/17 0923      PT SHORT TERM GOAL #1   Title  Pt will be independent with HEP in order to improve strength and decrease pain balance in order to improve pain-free function at home.    Time  4    Period  Weeks    Status  New        PT Long Term Goals - 08/30/17 1607      PT LONG TERM GOAL #  1   Title  Pt will increase LEFS by at least 9 points in order to demonstrate significant improvement in lower extremity function.      Baseline  08/21/17: 32/80    Time  6    Period  Weeks    Status  New      PT LONG TERM GOAL #2   Title  Pt will decrease mODI scoreby at least 13 points in order demonstrate clinically significant reduction in pain/disability     Baseline  08/21/17: 44%    Time  6    Period  Weeks    Status  New      PT LONG TERM GOAL #3   Title  Pt will decrease worst L hip and bilateral pain as reported on NPRS by at least 3 points in order to demonstrate clinically significant reduction in pain.    Baseline  08/21/17: 10/10 for both L hip and bilateral knees    Time  6    Period  Weeks    Status  New      PT LONG TERM GOAL #4   Title  Pt will improve 34mWT distance to at least 1,200 ft to demonstrate improve safety and ambulation in the community setting    Baseline  970 ft    Time  5    Period  Weeks    Status  New            Plan - 09/10/17 1702    Clinical Impression Statement  Pt continued to find pain relief with manual techniques with noted tissue release. PT attempted to increase therex and patient was able to perform exercises with correct form, with no increased pain, but had to be continually monitored throughout session d/t c/o dizziness with visual blurring, and extreme fatigue (not muscular, just general). BP recored in note section d/t multiple readings and positions. PT advised patient to see PCP which she reported she did a couple weeks ago and was "ignored". PT encouraged patient  to reach out again if dizziness persists and to eat a protein meal before PT session and drink water (as she reported only eating a yogurt prior to session).     Rehab Potential  Good    PT Frequency  2x / week    PT Duration  6 weeks    PT Treatment/Interventions  Aquatic Therapy;Biofeedback;Cryotherapy;Electrical Stimulation;Iontophoresis 4mg /ml Dexamethasone;Canalith Repostioning;Moist Heat;Traction;Ultrasound;DME Instruction;Gait training;Stair training;Functional mobility training;Therapeutic exercise;Therapeutic activities;Balance training;Neuromuscular re-education;Patient/family education;Manual techniques;Passive range of motion    PT Next Visit Plan  Continue to progress LE strength ther ex's as tolerated at next visit    PT Home Exercise Plan  Single knee to chest stretch 30s hold x 3 bilateral BID, bridges 2 x 10 BID; standing mini squats and hip / SLR with 1-2# ankle wt    Consulted and Agree with Plan of Care  Patient       Patient will benefit from skilled therapeutic intervention in order to improve the following deficits and impairments:     Visit Diagnosis: Chronic pain of left knee  Chronic pain of right knee     Problem List Patient Active Problem List   Diagnosis Date Noted  . Rheumatoid arthritis ([place]) 07/27/2017  . Chest pain 07/16/2017  . DVT, lower extremity, distal, acute, bilateral ([place]) 02/23/2017  . 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 Counseling regarding end of life decision making 01/14/2015  . Oral  candidiasis 10/27/2014  . Short-term memory loss 10/17/2013  . Cerebral vascular disease 01/01/2013  . 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[place])   . PVC's (premature ventricular contractions) 06/03/2012  . Post herpetic neuralgia 10/11/2011  . Osteoarthritis 08/10/2011  . MYOCARDIAL INFARCTION, HX OF 08/01/2010  . DEGENERATIVE DISC DISEASE, LUMBAR SPINE  01/15/2009  . Fibromyalgia 05/20/2008  . Hyperlipidemia 05/29/2007  . OBSTRUCTIVE SLEEP APNEA 05/29/2007  . Essential hypertension, benign 05/29/2007  . ALLERGIC RHINITIS 05/29/2007  . ACNE, ROSACEA 05/29/2007   Shelton Silvas PT, DPT Shelton Silvas 09/10/2017, 5:09 PM   [facility] [address] [place], [address] Phone: [phone]   Fax:  [phone]  Name: Kristin [name] MRN: [id number] Date of Birth: 1940/09/29

## 2017-09-13 ENCOUNTER — Encounter: Payer: Self-pay | Admitting: Physical Therapy

## 2017-09-13 ENCOUNTER — Ambulatory Visit: Payer: PPO | Admitting: Physical Therapy

## 2017-09-13 DIAGNOSIS — M25561 Pain in right knee: Secondary | ICD-10-CM

## 2017-09-13 DIAGNOSIS — M25562 Pain in left knee: Secondary | ICD-10-CM | POA: Diagnosis not present

## 2017-09-13 DIAGNOSIS — M25552 Pain in left hip: Secondary | ICD-10-CM

## 2017-09-13 DIAGNOSIS — G8929 Other chronic pain: Secondary | ICD-10-CM

## 2017-09-13 NOTE — Therapy (Signed)
[facility] [address], [place], [address] Phone: [phone]   Fax:  [phone]  Physical Therapy Treatment  Patient Details  Name: Kristin [name] MRN: [id number] Date of Birth: 03-27-1941 Referring Provider: Eliezer Lofts   Encounter Date: 09/13/2017    Past Medical History:  Diagnosis Date  . Arthritis    recent falls -aug 2010  . [name] (coronary artery disease)    mi-1996  . Complication of anesthesia    pt states takes a long time to wake her wake up  . 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 Cough    no fever   . Degeneration of lumbar or lumbosacral intervertebral disc   . Dizziness    r/t meds  . Fibromyalgia   . History of shingles   . [name] (irritable bowel syndrome)    takes OTC Hardin Negus colon health  . Joint pain   . Joint swelling   . Myalgia and myositis, unspecified   . Obstructive sleep apnea (adult) (pediatric)   . Old myocardial infarction 1996  . Osteoarthritis   . Osteoarthrosis, unspecified whether generalized or localized, unspecified site   . Psoriatic arthritis ([place])   . Rheumatoid arthritis ([place])   . Rosacea   . Seasonal allergies   . Stroke [facility])    ischaemic microvascular disease  . 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 Unspecified essential hypertension    takes Diltiazem,Metoprolol,and Losartan daily  . UTI (lower urinary tract infection)    frequent=last one was winter of 2014    Past Surgical History:  Procedure Laterality Date  . ATHERECTOMY  1996  . [place]   vasospams; no blockage  . CARDIAC CATHETERIZATION  2013   No obstructive coronary artery disease. Mild distal LAD and proximal RCA disease.  [name] CATARACT EXTRACTION Bilateral   . COLONOSCOPY    . CT ABD WO/W & PELVIS WO CM  2013   no acute process,  + minimal diverticulosis and hepatic/renal cysts, ATH calcifications  . ETHMOIDECTOMY  1998  . 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 NASAL SINUS SURGERY  1998  . OOPHORECTOMY     BSO  . sinus biopsy  1998   inverted papiloma  . VAGINAL HYSTERECTOMY  1992   for mennorhagia-LAVH BSO    There were no vitals filed for this visit.  Subjective Assessment - 09/13/17 1559    Subjective  Patient reports her pain has been 4/10 and she feels as though she is making progress. Reports compliance with her HEP. No questions or concerns at this time.     Pertinent History  Pt reports she has a history of rheumatoid arthritis which results in bilateral knee pain, posterior L hip pain, bilateral hand pain, bilateral elbow pain. Her pain has worsened since June. She is most bothered by her bilateral knee and L posterior hip pain. She has been receiving acupuncture through [facility] which she feels has been helping. She tried Methotrexate but was unable to tolerate it due to an itchy rash that developed. Today's session focused on patient's knee pain. Worst knee pain is 10/10, best: 0/10, present: 0/10. Worst hip pain: 10/10,  Best: 1/10, Present: 1/10. She describes the pain as "grinding" in the knees and "sharp" in the hip. Knee pain doesn't radiate. She repeats multiple times the fact that she has crepitus in her knees. L posterior hip pain radiates down to the posterior knee. She sees a rheumatologist who gives her "shots in my sciatic nerve" which helps some. She is unable to recall when she last had an injection. She states that she will follow-up with her rheumatologist in in late January/early February. Denies numbness/tingling in bilateral legs. She complains of bilateral LE weakness, worse on the left. Pain in the knees lasts all day and she  doesn't notice any difference in severity from morning to night. Hip pain wakes her up at night but not knee pain. ROS negative for red flags.    Limitations  Walking    How long can you sit comfortably?  30 minutes    How long can you stand comfortably?  10 minutes    How long can you walk comfortably?  5-10 minutes    Diagnostic tests  None related to knees or posterior hip    Patient Stated Goals  Decreased knee pain, "be able to walk farther." Decreased "sciatic nerve pain."    Pain Onset  More than a month ago           Manual  -STM w/ trigger point release  To L glute med and piriformis  -Hamstring 90/90 stretch w/ active DF/PF for nerve glide with notable tissue lengthening following 54min bout 4 bouts -Hamstring stretch 3x 45sec holds -Glute/piriformis L knee to R shoulder stretch 3x 45sec holds   Ther-Ex -4x 10 mini squat with treadmill bar UE support and chair behind patient for safety. Required VC  -3x 10 bilat hip abd w/ yellow tband w/ consistent VC for proper posture -3x 10 bilat hip ext w/ yellow tband w/ consistent VC for proper posture -OMEGA leg press 3x 10 10#                   PT Education - 09/13/17 1624    Education provided  Yes    Education Details  Exercise technique    Methods  Explanation;Demonstration;Tactile cues;Verbal cues    Comprehension  Returned demonstration;Need further instruction       PT Short Term Goals - 08/30/17 4098      PT SHORT TERM GOAL #1   Title  Pt will be independent with HEP in order to improve strength and decrease pain balance in order to improve pain-free function at home.    Time  4    Period  Weeks    Status  New        PT Long Term Goals - 08/30/17 1191      PT LONG TERM GOAL #1   Title  Pt will increase LEFS by at least 9 points in order to demonstrate significant improvement in lower extremity function.      Baseline  08/21/17: 32/80    Time  6    Period  Weeks    Status  New      PT LONG  TERM GOAL #2   Title  Pt will decrease mODI scoreby at least 13 points in order demonstrate clinically significant reduction in pain/disability     Baseline  08/21/17: 44%    Time  6    Period  Weeks    Status  New      PT LONG TERM GOAL #3  Title  Pt will decrease worst L hip and bilateral pain as reported on NPRS by at least 3 points in order to demonstrate clinically significant reduction in pain.    Baseline  08/21/17: 10/10 for both L hip and bilateral knees    Time  6    Period  Weeks    Status  New      PT LONG TERM GOAL #4   Title  Pt will improve 70mWT distance to at least 1,200 ft to demonstrate improve safety and ambulation in the community setting    Baseline  970 ft    Time  5    Period  Weeks    Status  New              Patient will benefit from skilled therapeutic intervention in order to improve the following deficits and impairments:     Visit Diagnosis: No diagnosis found.     Problem List Patient Active Problem List   Diagnosis Date Noted  . Rheumatoid arthritis ([place]) 07/27/2017  . Chest pain 07/16/2017  . DVT, lower extremity, distal, acute, bilateral ([place]) 02/23/2017  . 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 Counseling regarding end of life decision making 01/14/2015  . Oral candidiasis 10/27/2014  . Short-term memory loss 10/17/2013  . Cerebral vascular disease 01/01/2013  . 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[place])   . PVC's (premature ventricular contractions) 06/03/2012  . Post herpetic neuralgia 10/11/2011  . Osteoarthritis 08/10/2011  . MYOCARDIAL INFARCTION, HX OF 08/01/2010  . 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 Fibromyalgia 05/20/2008  . Hyperlipidemia 05/29/2007  . OBSTRUCTIVE SLEEP APNEA 05/29/2007  . Essential hypertension, benign 05/29/2007  . ALLERGIC RHINITIS 05/29/2007  . ACNE, ROSACEA 05/29/2007    Shelton Silvas 09/13/2017, 4:25 PM  [facility] [address], [place], [address] Phone: [phone]   Fax:  [phone]  Name: Kristin [name] MRN: [id number] Date of Birth: [birth year]

## 2017-09-17 ENCOUNTER — Encounter: Payer: Self-pay | Admitting: Physical Therapy

## 2017-09-17 ENCOUNTER — Ambulatory Visit: Payer: PPO | Attending: Family Medicine | Admitting: Physical Therapy

## 2017-09-17 DIAGNOSIS — M25552 Pain in left hip: Secondary | ICD-10-CM | POA: Insufficient documentation

## 2017-09-17 DIAGNOSIS — G8929 Other chronic pain: Secondary | ICD-10-CM | POA: Insufficient documentation

## 2017-09-17 DIAGNOSIS — M25561 Pain in right knee: Secondary | ICD-10-CM | POA: Insufficient documentation

## 2017-09-17 DIAGNOSIS — M25562 Pain in left knee: Secondary | ICD-10-CM | POA: Diagnosis not present

## 2017-09-17 NOTE — Therapy (Signed)
[facility] [address], [place], [address] Phone: [phone]   Fax:  [phone]  Physical Therapy Treatment  Patient Details  Name: Kristin [name] MRN: [id number] Date of Birth: [birth year] Referring Provider: Eliezer Lofts   Encounter Date: 09/17/2017  PT End of Session - 09/17/17 1339    Visit Number  9    Number of Visits  13    Date for PT Re-Evaluation  10/02/17    PT Start Time  0110    PT Stop Time  0150    PT Time Calculation (min)  40 min    Activity Tolerance  Patient tolerated treatment well    Behavior During Therapy  [facility] for tasks assessed/performed       Past Medical History:  Diagnosis Date  . Arthritis    recent falls -aug 2010  . [name] (coronary artery disease)    mi-1996  . Complication of anesthesia    pt states takes a long time to wake her wake up  . 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 Cough    no fever   . Degeneration of lumbar or lumbosacral intervertebral disc   . Dizziness    r/t meds  . Fibromyalgia   . History of shingles   . [name] (irritable bowel syndrome)    takes OTC Hardin Negus colon health  . Joint pain   . Joint swelling   . Myalgia and myositis, unspecified   . Obstructive sleep apnea (adult) (pediatric)   . Old myocardial infarction 1996  . Osteoarthritis   . Osteoarthrosis, unspecified whether generalized or localized, unspecified site   . Psoriatic arthritis ([place])   . Rheumatoid arthritis ([place])   . Rosacea   . Seasonal allergies   . Stroke [facility])    ischaemic microvascular disease  . 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 Unspecified essential hypertension    takes Diltiazem,Metoprolol,and Losartan daily  . UTI (lower urinary tract infection)    frequent=last one was winter of 2014    Past Surgical History:  Procedure  Laterality Date  . ATHERECTOMY  1996  . [place]   vasospams; no blockage  . CARDIAC CATHETERIZATION  2013   No obstructive coronary artery disease. Mild distal LAD and proximal RCA disease.  [name] CATARACT EXTRACTION Bilateral   . COLONOSCOPY    . CT ABD WO/W & PELVIS WO CM  2013   no acute process, + minimal diverticulosis and hepatic/renal cysts, ATH calcifications  . ETHMOIDECTOMY  1998  . 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 NASAL SINUS SURGERY  1998  . OOPHORECTOMY     BSO  . sinus biopsy  1998   inverted papiloma  . VAGINAL HYSTERECTOMY  1992   for mennorhagia-LAVH BSO    There were no vitals filed for this visit.  Subjective Assessment - 09/17/17 1313    Subjective  Patient arrived at wrong time for appointment (12:50p for 2:30p appt), but was able to be seen d/t 1pm pt cancellation. Pt reports pain worsened following last session to a 5/10 that is intermittent. SHe reports she does not think pain is soreness in nature, but more  sharp in the buttock. Reports compliance with her HEP. Pt reports she has been "dizzy" for the past few days and that her ankles have swollen, but that they usually do "on and off". Pt denis N/V, chest pain, fever, chills at this time.     Pertinent History  Pt reports she has a history of rheumatoid arthritis which results in bilateral knee pain, posterior L hip pain, bilateral hand pain, bilateral elbow pain. Her pain has worsened since June. She is most bothered by her bilateral knee and L posterior hip pain. She has been receiving acupuncture through [facility] which she feels has been helping. She tried Methotrexate but was unable to tolerate it due to an itchy rash that developed. Today's session focused on patient's knee pain. Worst knee pain is 10/10, best: 0/10,  present: 0/10. Worst hip pain: 10/10, Best: 1/10, Present: 1/10. She describes the pain as "grinding" in the knees and "sharp" in the hip. Knee pain doesn't radiate. She repeats multiple times the fact that she has crepitus in her knees. L posterior hip pain radiates down to the posterior knee. She sees a rheumatologist who gives her "shots in my sciatic nerve" which helps some. She is unable to recall when she last had an injection. She states that she will follow-up with her rheumatologist in in late January/early February. Denies numbness/tingling in bilateral legs. She complains of bilateral LE weakness, worse on the left. Pain in the knees lasts all day and she doesn't notice any difference in severity from morning to night. Hip pain wakes her up at night but not knee pain. ROS negative for red flags.    Limitations  Walking    How long can you sit comfortably?  30 minutes    How long can you stand comfortably?  10 minutes    How long can you walk comfortably?  5-10 minutes    Diagnostic tests  None related to knees or posterior hip    Patient Stated Goals  Decreased knee pain, "be able to walk farther." Decreased "sciatic nerve pain."    Pain Onset  More than a month ago          Manual -STM and trigger point release to L piriformis and glute med with 0/10 pain reported following and noted tension relief   Ther-Ex -Seated hamstring stretch 3x 30sec with VC for proper form -Seated piriformis stretch 3x 30sec with VC for proper form -OMEGA hamstring curl 10# 3x 12 with eccentric focus  -OMEGA knee ext attempted at 10# and then 5# was was uncomfortable to patient so discontinued and attempted: -OMEGA leg press (bilat) 10# 3x 12   *After manual exercise patient reported extreme dizziness when sitting up, PT checked pts BP and pulse rate 5 min later after patient felt "more stable" and it was 132/58 via BP machine, and PT re-checked manually and read 132/80. Pt continued to report dizziness  with standing, but was able to complete seated exercises with no dizziness complaints. Prolonged rest and monitoring of patient sx between all exercises                     PT Education - 09/17/17 1321    Education provided  Yes    Education Details  Educated on cardiologist follow-up for dizziness    Person(s) Educated  Patient    Methods  Explanation;Verbal cues    Comprehension  Verbalized understanding       PT Short Term Goals -  08/30/17 0923      PT SHORT TERM GOAL #1   Title  Pt will be independent with HEP in order to improve strength and decrease pain balance in order to improve pain-free function at home.    Time  4    Period  Weeks    Status  New        PT Long Term Goals - 08/30/17 5093      PT LONG TERM GOAL #1   Title  Pt will increase LEFS by at least 9 points in order to demonstrate significant improvement in lower extremity function.      Baseline  08/21/17: 32/80    Time  6    Period  Weeks    Status  New      PT LONG TERM GOAL #2   Title  Pt will decrease mODI scoreby at least 13 points in order demonstrate clinically significant reduction in pain/disability     Baseline  08/21/17: 44%    Time  6    Period  Weeks    Status  New      PT LONG TERM GOAL #3   Title  Pt will decrease worst L hip and bilateral pain as reported on NPRS by at least 3 points in order to demonstrate clinically significant reduction in pain.    Baseline  08/21/17: 10/10 for both L hip and bilateral knees    Time  6    Period  Weeks    Status  New      PT LONG TERM GOAL #4   Title  Pt will improve 44mWT distance to at least 1,200 ft to demonstrate improve safety and ambulation in the community setting    Baseline  970 ft    Time  5    Period  Weeks    Status  New            Plan - 09/17/17 1414    Clinical Impression Statement  Pt reported increased dizziness w/ visual disturbances after sitting up following manual therapy. PT monitored symptoms through  the rest of the session and patient was able to complete seated exercises with minimal dizziness sensation. Pt reported she has been dizzy the past 3 days straight, and has noted bilat ankle edema. Pt reported she thinks she should make an appt with her cardiologist, as she does not feel "heard" by her PCP, which PT encouraged. PT encouraged patient to not hesitate to go to the ER if symptoms persist or if they are accompanied by chest pain. Patient verbalized understanding and agreed. Patient also noted that she wants to stop taking her tramadol (although she takes it at night).  PT will continue stability/strengthening as allowed by patient's health status/     Rehab Potential  Good    PT Frequency  2x / week    PT Duration  6 weeks    PT Treatment/Interventions  Aquatic Therapy;Biofeedback;Cryotherapy;Electrical Stimulation;Iontophoresis 4mg /ml Dexamethasone;Canalith Repostioning;Moist Heat;Traction;Ultrasound;DME Instruction;Gait training;Stair training;Functional mobility training;Therapeutic exercise;Therapeutic activities;Balance training;Neuromuscular re-education;Patient/family education;Manual techniques;Passive range of motion    PT Next Visit Plan  assess stair ambulation; continue manual as indicated and LE therex as tolerated    PT Home Exercise Plan  Single knee to chest stretch 30s hold x 3 bilateral BID, bridges 2 x 10 BID; standing mini squats and hip / SLR with 1-2# ankle wt       Patient will benefit from skilled therapeutic intervention in order to improve the following deficits  and impairments:  Pain, Difficulty walking  Visit Diagnosis: Pain in left hip     Problem List Patient Active Problem List   Diagnosis Date Noted  . Rheumatoid arthritis ([place]) 07/27/2017  . Chest pain 07/16/2017  . DVT, lower extremity, distal, acute, bilateral ([place]) 02/23/2017  . 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 Counseling regarding end of life decision making 01/14/2015  . Oral candidiasis 10/27/2014  . Short-term memory loss 10/17/2013  . Cerebral vascular disease 01/01/2013  . 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[place])   . PVC's (premature ventricular contractions) 06/03/2012  . Post herpetic neuralgia 10/11/2011  . Osteoarthritis 08/10/2011  . MYOCARDIAL INFARCTION, HX OF 08/01/2010  . 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 Fibromyalgia 05/20/2008  . Hyperlipidemia 05/29/2007  . OBSTRUCTIVE SLEEP APNEA 05/29/2007  . Essential hypertension, benign 05/29/2007  . ALLERGIC RHINITIS 05/29/2007  . ACNE, ROSACEA 05/29/2007   Shelton Silvas PT, DPT Shelton Silvas 09/17/2017, 2:27 PM  [facility] [address], [place], [address] Phone: [phone]   Fax:  [phone]  Name: TONITA BILLS MRN: [id number] Date of Birth: 1941/05/17

## 2017-09-18 ENCOUNTER — Ambulatory Visit: Payer: Self-pay | Admitting: *Deleted

## 2017-09-18 ENCOUNTER — Telehealth: Payer: Self-pay | Admitting: Cardiovascular Disease

## 2017-09-18 NOTE — Telephone Encounter (Signed)
Pt states she has been going to physical therapy, and they have taken her BP and has advised her to call Dr. Fletcher Anon. 150/80 "on a machine", manually it was 130/80. Please call.

## 2017-09-18 NOTE — Telephone Encounter (Signed)
Pt reports mild lightheadedness "several weeks" but worse today. States is not positional, noted at rest as well. No loss of balance; states she can go about her ADLs.without issue.  Seen in outpatient rehab; pt states P.T. there said "may be inner ear issue." B/P yesterday 140/80, no recent medication changes. HR during call 78. No chest pain, nausea, fever, states is staying hydrated. Next available appt made with Dr. Danise Mina. Home care advice given; pt instructed to call back if symptoms worsen.   Reason for Disposition . [1] MILD dizziness (e.g., walking normally) AND [2] has NOT been evaluated by physician for this  (Exception: dizziness caused by heat exposure, sudden standing, or poor fluid intake)  Answer Assessment - Initial Assessment Questions 1. DESCRIPTION: "Describe your dizziness."     Lightheaded 2. LIGHTHEADED: "Do you feel lightheaded?" (e.g., somewhat faint, woozy, weak upon standing)     Not positional 3. VERTIGO: "Do you feel like either you or the room is spinning or tilting?" (i.e. vertigo)     no 4. SEVERITY: "How bad is it?"  "Do you feel like you are going to faint?" "Can you stand and walk?"   - MILD - walking normally   - MODERATE - interferes with normal activities (e.g., work, school)    - SEVERE - unable to stand, requires support to walk, feels like passing out now.      Mild but constant 5. ONSET:  "When did the dizziness begin?"     Since rehab started. Worse today, B/P yesterday 140/80 6. AGGRAVATING FACTORS: "Does anything make it worse?" (e.g., standing, change in head position)     Nothing 7. HEART RATE: "Can you tell me your heart rate?" "How many beats in 15 seconds?"  (Note: not all patients can do this)       78 8. CAUSE: "What do you think is causing the dizziness?"     Not sure. Rehab said maybe "inner ear." 9. RECURRENT SYMPTOM: "Have you had dizziness before?" If so, ask: "When was the last time?" "What happened that time?"     No 10. OTHER  SYMPTOMS: "Do you have any other symptoms?" (e.g., fever, chest pain, vomiting, diarrhea, bleeding)      No  Protocols used: DIZZINESS Heidi Dach

## 2017-09-18 NOTE — Telephone Encounter (Signed)
S/w patient.  She says she's been doing PT with [facility] off [address] in [place]. You can view the sessions in Epic. BP at therapy today was 132/58 with automatic cuff and 132/80 with manual cuff. Physical Therapy said she should call Dr Fletcher Anon. Her main complaint of dizziness.  She says it happens at different times, not just isolated to when she changes positions.  She also feels sleepy and tired a lot of the time. Denies chest pain, shortness of breath or palpitations. Patient thinks it could be inner ear.  She admits sometimes she gets confused easily.  Advised patient that her BP looks stable. Advised her she should call her PCP concerning dizziness and she will call us back if needed with any issues or concerns.

## 2017-09-19 ENCOUNTER — Ambulatory Visit (INDEPENDENT_AMBULATORY_CARE_PROVIDER_SITE_OTHER): Payer: PPO | Admitting: Family Medicine

## 2017-09-19 ENCOUNTER — Encounter: Payer: Self-pay | Admitting: Family Medicine

## 2017-09-19 VITALS — Temp 98.2°F | Wt 140.0 lb

## 2017-09-19 DIAGNOSIS — R42 Dizziness and giddiness: Secondary | ICD-10-CM | POA: Diagnosis not present

## 2017-09-19 DIAGNOSIS — R5382 Chronic fatigue, unspecified: Secondary | ICD-10-CM | POA: Diagnosis not present

## 2017-09-19 NOTE — Assessment & Plan Note (Addendum)
BP stable today. Check orthostatic vitals today - normal.  Mild pedal edema - anticipate dilt related. Reviewed supportive measures.  rec start compression stockings.  Update if not improving with this.

## 2017-09-19 NOTE — Assessment & Plan Note (Signed)
Longstanding fatigue, reviewed recent labwork within the past year overall normal. Will not repeat at this time. Encouraged ongoing good hydration status.

## 2017-09-19 NOTE — Patient Instructions (Addendum)
Recent labs looked ok. Orthostatic vital signs today. Start using compression stockings.  Make sure you're staying well hydrated, avoid too much salt in the diet.  Let us know if not improving with this.  Good to see you today, call us with questions.

## 2017-09-19 NOTE — Progress Notes (Signed)
Temp 98.2 F (36.8 C) (Oral)   Wt 140 lb (63.5 kg)   LMP 08/14/1989   SpO2 100%   BMI 22.60 kg/m    CC: dizziness Subjective:    Patient ID: Kristin [name], female    DOB: 14-Oct-1940, 77 y.o.   MRN: [id number]  HPI: Kristin [name] is a 77 y.o. female presenting on 09/19/2017 for Dizziness (Per physical therapist, pt c/o dizziness when going to standing position, BP fluctuation, swelling in bilateral ankles. Pt contacted her cardiologist and suggested pt may have inner ear issue. Pt is accompanied by husband, Burce.)   Here with husband. Pt of Dr Rometta Emery followed by cardiology for [name], on diltiazem 240mg  daily and aspirin 325mg  daily. Known RA treats with tramadol (at night) and tylenol. H/o DVT.   She has been going to physical therapy for RA/FM where they have noticed BP fluctuations always higher with automated cuff (160/80), better with manual cuff 140/70. Some headache the last few weeks. Staying tired, sleepy, fatigued, dizziness "things look blurry and I feel sleepy", she feels lightheaded when she gets up too quickly. Ankles stay mildly swollen. Ongoing symptoms over months.   Seen 06/2017 at ER for chest discomfort, fatigue, forgetfulness. Records reviewed. Reviewed latest labs as well.   Denies vertigo, presyncope, LOC, abd pain, chest pain, palpitations, vision changes. No tinnitus, hearing changes or URI sxs (cough, congestion).   She feels she does well with hydration (water, gatorade)  Relevant past medical, surgical, family and social history reviewed and updated as indicated. Interim medical history since our last visit reviewed. Allergies and medications reviewed and updated. Outpatient Medications Prior to Visit  Medication Sig Dispense Refill  . acetaminophen (TYLENOL) 325 MG tablet Take 325 mg by mouth every 8 (eight) hours as needed (pain).     [name] aspirin EC 325 MG tablet Take 1 tablet (325 mg total) by mouth daily. 100 tablet 3  . Calcium Carbonate-Vitamin D  (CALTRATE 600+D PO) Take 1 tablet by mouth 2 (two) times daily.     [name] diltiazem (CARDIZEM CD) 240 MG 24 hr capsule TAKE ONE CAPSULE BY MOUTH DAILY 30 capsule 6  . FOLIC ACID PO Take by mouth daily.    . Multiple Vitamin (MULTIVITAMIN) tablet Take 1 tablet by mouth daily.      . nitroGLYCERIN (NITROSTAT) 0.4 MG SL tablet Place 1 tablet (0.4 mg total) every 5 (five) minutes as needed under the tongue for chest pain. 30 tablet 3  . Polyethyl Glycol-Propyl Glycol (SYSTANE) 0.4-0.3 % SOLN Apply 1 drop to eye daily as needed (dry eyes).     . traMADol (ULTRAM) 50 MG tablet Take by mouth every 6 (six) hours as needed.    . Omega-3 Fatty Acids (FISH OIL) 1200 MG CAPS Take 1 capsule by mouth 2 (two) times daily.     . Probiotic Product ([place]) CAPS Take 1 capsule by mouth daily.    . vitamin B-12 (CYANOCOBALAMIN) 1000 MCG tablet Take 1,000 mcg by mouth daily.    . Coenzyme Q10 200 MG capsule Take 200 mg by mouth daily.      [name] diltiazem (CARDIZEM CD) 240 MG 24 hr capsule TAKE ONE CAPSULE BY MOUTH DAILY 90 capsule 0  . econazole nitrate 1 % cream Apply 1 application topically 2 (two) times daily.     No facility-administered medications prior to visit.      Per HPI unless specifically indicated in ROS section below Review of Systems  Objective:    Temp 98.2 F (36.8 C) (Oral)   Wt 140 lb (63.5 kg)   LMP 08/14/1989   SpO2 100%   BMI 22.60 kg/m   Wt Readings from Last 3 Encounters:  09/19/17 140 lb (63.5 kg)  07/31/17 139 lb (63 kg)  07/27/17 142 lb (64.4 kg)    Physical Exam  Constitutional: She appears well-developed and well-nourished. No distress.  HENT:  Mouth/Throat: Oropharynx is clear and moist. No oropharyngeal exudate.  Eyes: Conjunctivae are normal. Pupils are equal, round, and reactive to light.  Neck: No thyromegaly present.  Cardiovascular: Normal rate, regular rhythm, normal heart sounds and intact distal pulses.  No murmur heard. Pulmonary/Chest:  Effort normal and breath sounds normal. No respiratory distress. She has no wheezes. She has no rales.  Abdominal: Soft. Normal appearance and bowel sounds are normal. She exhibits no distension and no mass. There is no hepatosplenomegaly. There is no tenderness. There is no rigidity, no rebound, no guarding, no CVA tenderness and negative Murphy's sign.  Musculoskeletal: She exhibits edema (1+ pedal edema).  Skin: Skin is warm and dry. No rash noted.  Psychiatric: She has a normal mood and affect.  Nursing note and vitals reviewed.  Results for orders placed or performed in visit on 07/27/17  Vitamin B12  Result Value Ref Range   Vitamin B-12 779 211 - 911 pg/mL  VITAMIN D 25 Hydroxy (Vit-D Deficiency, Fractures)  Result Value Ref Range   VITD 49.47 30.00 - 100.00 ng/mL      Assessment & Plan:   Problem List Items Addressed This Visit    Chronic fatigue    Longstanding fatigue, reviewed recent labwork within the past year overall normal. Will not repeat at this time. Encouraged ongoing good hydration status.       Orthostatic dizziness - Primary    BP stable today. Check orthostatic vitals today - normal.  Mild pedal edema - anticipate dilt related. Reviewed supportive measures.  rec start compression stockings.  Update if not improving with this.           Follow up plan: Return if symptoms worsen or fail to improve.  Ria Bush, MD

## 2017-09-20 ENCOUNTER — Telehealth: Payer: Self-pay

## 2017-09-20 ENCOUNTER — Encounter: Payer: Self-pay | Admitting: Physical Therapy

## 2017-09-20 ENCOUNTER — Ambulatory Visit: Payer: PPO | Admitting: Physical Therapy

## 2017-09-20 DIAGNOSIS — M25552 Pain in left hip: Secondary | ICD-10-CM

## 2017-09-20 NOTE — Therapy (Signed)
[facility] [address], [place], [address] Phone: [phone]   Fax:  [phone]  Physical Therapy Treatment  Patient Details  Name: Kristin [name] MRN: [id number] Date of Birth: 1941/06/07 Referring Provider: Eliezer Lofts   Encounter Date: 09/20/2017  PT End of Session - 09/20/17 1500    Visit Number  10    Number of Visits  13    Date for PT Re-Evaluation  10/02/17    PT Start Time  0230    PT Stop Time  0315    PT Time Calculation (min)  45 min    Equipment Utilized During Treatment  Gait belt    Activity Tolerance  Patient tolerated treatment well    Behavior During Therapy  [facility] for tasks assessed/performed       Past Medical History:  Diagnosis Date  . Arthritis    recent falls -aug 2010  . [name] (coronary artery disease)    mi-1996  . Complication of anesthesia    pt states takes a long time to wake her wake up  . 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 ([place]) 08/27/2015  . Cough    no fever   . Degeneration of lumbar or lumbosacral intervertebral disc   . Dizziness    r/t meds  . Fibromyalgia   . History of shingles   . [name] (irritable bowel syndrome)    takes OTC [place]in Negus colon health  . Joint pain   . Joint swelling   . Myalgia and myositis, unspecified   . Obstructive sleep apnea (adult) (pediatric)   . Old myocardial infarction 1996  . Osteoarthritis   . Osteoarthrosis, unspecified whether generalized or localized, unspecified site   . Psoriatic arthritis ([place])   . Rheumatoid arthritis ([place])   . Rosacea   . Seasonal allergies   . Stroke [facility])    ischaemic microvascular disease  . 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 Unspecified essential hypertension    takes Diltiazem,Metoprolol,and Losartan daily  . UTI (lower urinary tract infection)    frequent=last one was  winter of 2014    Past Surgical History:  Procedure Laterality Date  . ATHERECTOMY  1996  . [place]   vasospams; no blockage  . CARDIAC CATHETERIZATION  2013   No obstructive coronary artery disease. Mild distal LAD and proximal RCA disease.  [name] CATARACT EXTRACTION Bilateral   . COLONOSCOPY    . CT ABD WO/W & PELVIS WO CM  2013   no acute process, + minimal diverticulosis and hepatic/renal cysts, ATH calcifications  . ETHMOIDECTOMY  1998  . 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 NASAL SINUS SURGERY  1998  . OOPHORECTOMY     BSO  . sinus biopsy  1998   inverted papiloma  . VAGINAL HYSTERECTOMY  1992   for mennorhagia-[facility] BSO    There were no vitals filed for this visit.  Subjective Assessment - 09/20/17 1437    Subjective  Patient once again arrived at wrong time for appointment and reported severe fatigue. No pain in the L hip today Patient reports in the past week her pain has only been a 3/10 at its worst. She  reports she has 3 more appointments and thinks she will be okay to continue on her own after that. Patient reports that she has made good progress completing exercises on her own. Patient reports she went to her PCP yesterday, who told her she was cleared to continue PT and that "she was fine". Patient continues to report sever fatigue and dizziness. She comments "at least my sciatic nerve is better    Pertinent History  Pt reports she has a history of rheumatoid arthritis which results in bilateral knee pain, posterior L hip pain, bilateral hand pain, bilateral elbow pain. Her pain has worsened since June. She is most bothered by her bilateral knee and L posterior hip pain. She has been receiving acupuncture through [facility] which she feels has been helping. She tried Methotrexate but was  unable to tolerate it due to an itchy rash that developed. Today's session focused on patient's knee pain. Worst knee pain is 10/10, best: 0/10, present: 0/10. Worst hip pain: 10/10, Best: 1/10, Present: 1/10. She describes the pain as "grinding" in the knees and "sharp" in the hip. Knee pain doesn't radiate. She repeats multiple times the fact that she has crepitus in her knees. L posterior hip pain radiates down to the posterior knee. She sees a rheumatologist who gives her "shots in my sciatic nerve" which helps some. She is unable to recall when she last had an injection. She states that she will follow-up with her rheumatologist in in late January/early February. Denies numbness/tingling in bilateral legs. She complains of bilateral LE weakness, worse on the left. Pain in the knees lasts all day and she doesn't notice any difference in severity from morning to night. Hip pain wakes her up at night but not knee pain. ROS negative for red flags.    Limitations  Walking            Ther-Ex -Nustep during hx intake 31mins -OMEGA knee ext 5lbs 3x 10 w/ VC for eccentric control -OMEGA leg press 5lbs/10lbs 3x 10 increasing intensity -3x 12 TKE w/ yellow w/ 3sec hold -Standing hip abd yellow tband 3x 10 with VC for proper posture -Standing hip ext yellow tband 3x 10 with VC for eccentric control  Manual -Manual Figure 4 stretch 45sec 5x  -Long axis hip distraction 30sec bouts 5x w/ pain decrease                    PT Education - 09/20/17 1500    Education provided  Yes    Education Details  exercise form    Person(s) Educated  Patient    Methods  Explanation    Comprehension  Verbalized understanding       PT Short Term Goals - 08/30/17 0923      PT SHORT TERM GOAL #1   Title  Pt will be independent with HEP in order to improve strength and decrease pain balance in order to improve pain-free function at home.    Time  4    Period  Weeks    Status  New        PT  Long Term Goals - 08/30/17 2542      PT LONG TERM GOAL #1   Title  Pt will increase LEFS by at least 9 points in order to demonstrate significant improvement in lower extremity function.      Baseline  08/21/17: 32/80    Time  6    Period  Weeks    Status  New      PT LONG TERM GOAL #2   Title  Pt will decrease mODI scoreby at least 13 points in order demonstrate clinically significant reduction in pain/disability     Baseline  08/21/17: 44%    Time  6    Period  Weeks    Status  New      PT LONG TERM GOAL #3   Title  Pt will decrease worst L hip and bilateral pain as reported on NPRS by at least 3 points in order to demonstrate clinically significant reduction in pain.    Baseline  08/21/17: 10/10 for both L hip and bilateral knees    Time  6    Period  Weeks    Status  New      PT LONG TERM GOAL #4   Title  Pt will improve 63mWT distance to at least 1,200 ft to demonstrate improve safety and ambulation in the community setting    Baseline  970 ft    Time  5    Period  Weeks    Status  New            Plan - 09/20/17 1510    Clinical Impression Statement  Pt reported her dizziness was "minimal but constant today". She was able to complete therex progression w/o further pain or dizziness complaints. Patient reported her sciatica symptoms have subsided, but that she continues to have some weakness. PT assessed patient's soft tissue and patient continues to have groin tightness, but no palpable tighness at the glute med, which patient reports "feels much better".     Rehab Potential  Good    PT Frequency  2x / week    PT Duration  6 weeks    PT Treatment/Interventions  Aquatic Therapy;Biofeedback;Cryotherapy;Electrical Stimulation;Iontophoresis 4mg /ml Dexamethasone;Canalith Repostioning;Moist Heat;Traction;Ultrasound;DME Instruction;Gait training;Stair training;Functional mobility training;Therapeutic exercise;Therapeutic activities;Balance training;Neuromuscular  re-education;Patient/family education;Manual techniques;Passive range of motion    PT Next Visit Plan  assess stair ambulation; progress LE therex as tolerated    PT Home Exercise Plan  Single knee to chest stretch 30s hold x 3 bilateral BID, bridges 2 x 10 BID; standing mini squats and hip / SLR with 1-2# ankle wt    Consulted and Agree with Plan of Care  Patient       Patient will benefit from skilled therapeutic intervention in order to improve the following deficits and impairments:  Pain, Difficulty walking  Visit Diagnosis: Pain in left hip     Problem List Patient Active Problem List   Diagnosis Date Noted  . Rheumatoid arthritis ([place]) 07/27/2017  . Chest pain 07/16/2017  . DVT, lower extremity, distal, acute, bilateral ([place]) 02/23/2017  . 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 ([place]) 08/27/2015  . Counseling regarding end of life decision making 01/14/2015  . Oral candidiasis 10/27/2014  . Short-term memory loss 10/17/2013  . Cerebral vascular disease 01/01/2013  . 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[place])   . PVC's (premature ventricular contractions) 06/03/2012  . Post herpetic neuralgia 10/11/2011  . Osteoarthritis 08/10/2011  . Chronic fatigue 08/10/2011  . Orthostatic dizziness 08/10/2011  . MYOCARDIAL INFARCTION, HX OF 08/01/2010  . 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 Fibromyalgia 05/20/2008  . Hyperlipidemia 05/29/2007  . OBSTRUCTIVE SLEEP APNEA 05/29/2007  . Essential hypertension, benign 05/29/2007  . ALLERGIC RHINITIS 05/29/2007  . ACNE, ROSACEA 05/29/2007   Shelton Silvas PT, DPT Shelton Silvas 09/20/2017, 3:25 PM  [facility] [address], [place], [address] Phone: [phone]   Fax:  [phone]  Name: DAKODA BASSETTE MRN: [id number] Date of Birth: [birth year]

## 2017-09-20 NOTE — Telephone Encounter (Signed)
Please call Chelsea... Find out her concerns. If needs to be addressed acutely . Let me know.  Per his note she was not orthostatic.  Otherwise given I did not see the patient .[name] Let Dr. Darnell Level handle it when he returns tommorow. If needed hold rehab until he is able to comment tommorow.

## 2017-09-20 NOTE — Telephone Encounter (Signed)
Copied from [place]. Topic: Inquiry >> Sep 20, 2017  1:07 PM Scherrie Gerlach wrote: Reason for CRM: Shelton Silvas PT with [facility] in [place] states they referred pt back to the dr for  Blood pressure, dizziness and some other concerns.  Chelsea states looking at the dr notes from today's visit, pt did not exactly convey or address the concerns Vikki Ports has.  She would like to make sure everyone on the same page and pt is safe to continue. Chelsea would like a call back from the doctor to discuss

## 2017-09-20 NOTE — Telephone Encounter (Signed)
Chelsea notified that Dr. Darnell Level is out of the office this afternoon but I would forward the message to him since he was the one who saw [name] today and she should here back from him tomorrow.

## 2017-09-20 NOTE — Telephone Encounter (Signed)
Dr Darnell Level is out of office this afternoon with no computer access.Please advise.

## 2017-09-21 NOTE — Telephone Encounter (Signed)
Called again, left message. If she calls back, please find out what concerns she had. BP on manual check has been better than on automated cuff at PT. BP in office was ok. Recommend pt check bp at home and notify us if persistent concerns.

## 2017-09-21 NOTE — Telephone Encounter (Signed)
Called, left a message

## 2017-09-24 ENCOUNTER — Encounter: Payer: Self-pay | Admitting: Physical Therapy

## 2017-09-24 ENCOUNTER — Telehealth: Payer: Self-pay | Admitting: Physical Therapy

## 2017-09-24 ENCOUNTER — Ambulatory Visit: Payer: PPO | Admitting: Physical Therapy

## 2017-09-24 DIAGNOSIS — M25552 Pain in left hip: Secondary | ICD-10-CM | POA: Diagnosis not present

## 2017-09-24 NOTE — Telephone Encounter (Signed)
Chelsea PT to see pt later today and wanted to speak with Dr Darnell Level; pt has some short term memory loss, very dizzy, pale and loss of vision even when turning over. Dr Darnell Level is speaking with [facility].

## 2017-09-24 NOTE — Telephone Encounter (Signed)
Spoke with pt notifying her Dr. Darnell Level is recommending a follow up OV, even though she is feeling better.  Pt states she will have to check her calendar and will call back to schedule re-eval with Dr. Diona Browner.

## 2017-09-24 NOTE — Therapy (Signed)
[facility] [address] [place], [address] Phone: [phone]   Fax:  [phone]  Physical Therapy Treatment  Patient Details  Name: Kristin [name] MRN: [id number] Date of Birth: 17-Feb-1941 Referring Provider: Eliezer Lofts   Encounter Date: 09/24/2017    Past Medical History:  Diagnosis Date  . Arthritis    recent falls -aug 2010  . [name] (coronary artery disease)    mi-1996  . Complication of anesthesia    pt states takes a long time to wake her wake up  . 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 Cough    no fever   . Degeneration of lumbar or lumbosacral intervertebral disc   . Dizziness    r/t meds  . Fibromyalgia   . History of shingles   . [name] (irritable bowel syndrome)    takes OTC Hardin Negus colon health  . Joint pain   . Joint swelling   . Myalgia and myositis, unspecified   . Obstructive sleep apnea (adult) (pediatric)   . Old myocardial infarction 1996  . Osteoarthritis   . Osteoarthrosis, unspecified whether generalized or localized, unspecified site   . Psoriatic arthritis ([place])   . Rheumatoid arthritis ([place])   . Rosacea   . Seasonal allergies   . Stroke [facility])    ischaemic microvascular disease  . 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 Unspecified essential hypertension    takes Diltiazem,Metoprolol,and Losartan daily  . UTI (lower urinary tract infection)    frequent=last one was winter of 2014    Past Surgical History:  Procedure Laterality Date  . ATHERECTOMY  1996  . [place]   vasospams; no blockage  . CARDIAC CATHETERIZATION  2013   No obstructive coronary artery disease. Mild distal LAD and proximal RCA disease.  [name] CATARACT EXTRACTION Bilateral   . COLONOSCOPY    . CT ABD WO/W & PELVIS WO CM  2013   no acute process,  + minimal diverticulosis and hepatic/renal cysts, ATH calcifications  . ETHMOIDECTOMY  1998  . 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 NASAL SINUS SURGERY  1998  . OOPHORECTOMY     BSO  . sinus biopsy  1998   inverted papiloma  . VAGINAL HYSTERECTOMY  1992   for mennorhagia-LAVH BSO    There were no vitals filed for this visit.  Subjective Assessment - 09/24/17 1303    Subjective  Patient said she had some "sciatic flare up" after using the leg press, that has subsided. Patient reports no pain today. She reports she is still experiencing dizziness, but is in no pain. She reports she has to make another appt with her PCP because of "BP concerns", therapist reinforced that her severe dizziness was a cause of concern to PT.     Pertinent History  Pt reports she has a history of rheumatoid arthritis which results in bilateral knee pain, posterior L hip pain, bilateral hand pain, bilateral elbow pain. Her pain has worsened since June. She is most bothered by her bilateral knee and L posterior hip pain. She has been receiving acupuncture through [place] which she feels has been helping. She tried Methotrexate  but was unable to tolerate it due to an itchy rash that developed. Today's session focused on patient's knee pain. Worst knee pain is 10/10, best: 0/10, present: 0/10. Worst hip pain: 10/10, Best: 1/10, Present: 1/10. She describes the pain as "grinding" in the knees and "sharp" in the hip. Knee pain doesn't radiate. She repeats multiple times the fact that she has crepitus in her knees. L posterior hip pain radiates down to the posterior knee. She sees a rheumatologist who gives her "shots in my sciatic nerve" which helps some. She is unable to recall when she last had an injection. She states that she will follow-up with her  rheumatologist in in late January/early February. Denies numbness/tingling in bilateral legs. She complains of bilateral LE weakness, worse on the left. Pain in the knees lasts all day and she doesn't notice any difference in severity from morning to night. Hip pain wakes her up at night but not knee pain. ROS negative for red flags.    Limitations  Walking    How long can you sit comfortably?  30 minutes    How long can you stand comfortably?  10 minutes    How long can you walk comfortably?  5-10 minutes    Diagnostic tests  None related to knees or posterior hip    Patient Stated Goals  Decreased knee pain, "be able to walk farther." Decreased "sciatic nerve pain."    Pain Onset  More than a month ago          Manual -L glute med STM w/ trigger point release   -L manual figure 4 stretch 30sec holds x 4 -L knee to opposite shoulder LB and glute stretch 30sec holds x 4 -Long axis hip distraction 10sec w/ 10sec release x6   Following manual techniques patient reported "ungodly dizziness" , with "spinning" but no visual blurring, patient was observably pale and vitals were taken at: BP on manual cuff 171/70 manual pulse 68 and required 38mins of rest to be able to return to exercise  Ther-Ex -Nustep -Mini squats 3x 10 w/ bilat 2 finger UE support VC and TC for proper hip ext -Crab walk exercise w/ yellow tband 88ft down and back x2 VC for eccentric control -Seated knee ext 5# 3x 10  Following therex patient reported increased dizziness again and vitals were taken again at: BP on manual cuff following sitting down 176/72 5 mins later 169/71 with subsided symptoms. Patient continued to sit and drink water for five more minutes before safely exiting the clinic.                      PT Short Term Goals - 08/30/17 2774      PT SHORT TERM GOAL #1   Title  Pt will be independent with HEP in order to improve strength and decrease pain balance in order to improve pain-free  function at home.    Time  4    Period  Weeks    Status  New        PT Long Term Goals - 08/30/17 1287      PT LONG TERM GOAL #1   Title  Pt will increase LEFS by at least 9 points in order to demonstrate significant improvement in lower extremity function.      Baseline  08/21/17: 32/80    Time  6    Period  Weeks    Status  New      PT  LONG TERM GOAL #2   Title  Pt will decrease mODI scoreby at least 13 points in order demonstrate clinically significant reduction in pain/disability     Baseline  08/21/17: 44%    Time  6    Period  Weeks    Status  New      PT LONG TERM GOAL #3   Title  Pt will decrease worst L hip and bilateral pain as reported on NPRS by at least 3 points in order to demonstrate clinically significant reduction in pain.    Baseline  08/21/17: 10/10 for both L hip and bilateral knees    Time  6    Period  Weeks    Status  New      PT LONG TERM GOAL #4   Title  Pt will improve 68mWT distance to at least 1,200 ft to demonstrate improve safety and ambulation in the community setting    Baseline  970 ft    Time  5    Period  Weeks    Status  New              Patient will benefit from skilled therapeutic intervention in order to improve the following deficits and impairments:     Visit Diagnosis: No diagnosis found.     Problem List Patient Active Problem List   Diagnosis Date Noted  . Rheumatoid arthritis ([place]) 07/27/2017  . Chest pain 07/16/2017  . DVT, lower extremity, distal, acute, bilateral ([place]) 02/23/2017  . 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 Counseling regarding end of life decision making 01/14/2015  . Oral candidiasis 10/27/2014  . Short-term memory loss 10/17/2013  . Cerebral vascular disease 01/01/2013  . 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[place])   . PVC's (premature ventricular contractions) 06/03/2012  . Post herpetic neuralgia  10/11/2011  . Osteoarthritis 08/10/2011  . Chronic fatigue 08/10/2011  . Orthostatic dizziness 08/10/2011  . MYOCARDIAL INFARCTION, HX OF 08/01/2010  . 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 Fibromyalgia 05/20/2008  . Hyperlipidemia 05/29/2007  . OBSTRUCTIVE SLEEP APNEA 05/29/2007  . Essential hypertension, benign 05/29/2007  . ALLERGIC RHINITIS 05/29/2007  . ACNE, ROSACEA 05/29/2007   Shelton Silvas PT, DPT Shelton Silvas 09/24/2017, 1:24 PM  [facility] [address], [place], [address] Phone: [phone]   Fax:  [phone]  Name: Kristin [name] MRN: [id number] Date of Birth: 12-Feb-1941

## 2017-09-24 NOTE — Telephone Encounter (Addendum)
Spoke with PT about her concerns - last week had much worse severe dizziness (not just orthostatic) for 3 days associated with worse confusion (arriving to appt 1.5 hrs early), blurry vision, pallor and mild pedal edema.  I did not get this story from pt/husband last week.  This week pt is doing better.  I would offer re eval by me or PCP in office this week. plz call and schedule.

## 2017-09-24 NOTE — Telephone Encounter (Signed)
Patient calling back and she states appt is not needed, BP was only elevated due to exercises.

## 2017-09-27 ENCOUNTER — Ambulatory Visit: Payer: PPO | Admitting: Physical Therapy

## 2017-09-27 DIAGNOSIS — M25562 Pain in left knee: Secondary | ICD-10-CM

## 2017-09-27 DIAGNOSIS — M25552 Pain in left hip: Secondary | ICD-10-CM

## 2017-09-27 DIAGNOSIS — G8929 Other chronic pain: Secondary | ICD-10-CM

## 2017-09-27 DIAGNOSIS — M25561 Pain in right knee: Secondary | ICD-10-CM

## 2017-09-27 NOTE — Therapy (Signed)
[facility] [address], [place], [address] Phone: [phone]   Fax:  [phone]  Physical Therapy Treatment/Discharge   Patient Details  Name: Kristin [name] MRN: [id number] Date of Birth: Apr 05, 1941 Referring Provider: Eliezer Lofts   Encounter Date: 09/27/2017  PT End of Session - 09/27/17 1356    Visit Number  12    Number of Visits  13    Date for PT Re-Evaluation  10/02/17    PT Start Time  0123    PT Stop Time  0206    PT Time Calculation (min)  43 min    Activity Tolerance  Patient tolerated treatment well    Behavior During Therapy  [facility] for tasks assessed/performed       Past Medical History:  Diagnosis Date  . Arthritis    recent falls -aug 2010  . [name] (coronary artery disease)    mi-1996  . Complication of anesthesia    pt states takes a long time to wake her wake up  . 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 Cough    no fever   . Degeneration of lumbar or lumbosacral intervertebral disc   . Dizziness    r/t meds  . Fibromyalgia   . History of shingles   . [name] (irritable bowel syndrome)    takes OTC Hardin Negus colon health  . Joint pain   . Joint swelling   . Myalgia and myositis, unspecified   . Obstructive sleep apnea (adult) (pediatric)   . Old myocardial infarction 1996  . Osteoarthritis   . Osteoarthrosis, unspecified whether generalized or localized, unspecified site   . Psoriatic arthritis ([place])   . Rheumatoid arthritis ([place])   . Rosacea   . Seasonal allergies   . Stroke [facility])    ischaemic microvascular disease  . 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 Unspecified essential hypertension    takes Diltiazem,Metoprolol,and Losartan daily  . UTI (lower urinary tract infection)    frequent=last one was winter of 2014    Past Surgical History:   Procedure Laterality Date  . ATHERECTOMY  1996  . [place]   vasospams; no blockage  . CARDIAC CATHETERIZATION  2013   No obstructive coronary artery disease. Mild distal LAD and proximal RCA disease.  [name] CATARACT EXTRACTION Bilateral   . COLONOSCOPY    . CT ABD WO/W & PELVIS WO CM  2013   no acute process, + minimal diverticulosis and hepatic/renal cysts, ATH calcifications  . ETHMOIDECTOMY  1998  . 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 NASAL SINUS SURGERY  1998  . OOPHORECTOMY     BSO  . sinus biopsy  1998   inverted papiloma  . VAGINAL HYSTERECTOMY  1992   for mennorhagia-LAVH BSO    There were no vitals filed for this visit.  Subjective Assessment - 09/27/17 1344    Subjective  Patient reports "very little pain" in the hip or bilat knees today and in the past week. Patient reports she has not felt "as dizzy". Patient says she "graduates" PT today, which she is excited about and feels as though she can continue her HEP at  home to [place] PT progress    Pertinent History  Pt reports she has a history of rheumatoid arthritis which results in bilateral knee pain, posterior L hip pain, bilateral hand pain, bilateral elbow pain. Her pain has worsened since June. She is most bothered by her bilateral knee and L posterior hip pain. She has been receiving acupuncture through [place] which she feels has been helping. She tried Methotrexate but was unable to tolerate it due to an itchy rash that developed. Today's session focused on patient's knee pain. Worst knee pain is 10/10, best: 0/10, present: 0/10. Worst hip pain: 10/10, Best: 1/10, Present: 1/10. She describes the pain as "grinding" in the knees and "sharp" in the hip. Knee pain doesn't radiate. She repeats multiple times the fact that she has  crepitus in her knees. L posterior hip pain radiates down to the posterior knee. She sees a rheumatologist who gives her "shots in my sciatic nerve" which helps some. She is unable to recall when she last had an injection. She states that she will follow-up with her rheumatologist in in late January/early February. Denies numbness/tingling in bilateral legs. She complains of bilateral LE weakness, worse on the left. Pain in the knees lasts all day and she doesn't notice any difference in severity from morning to night. Hip pain wakes her up at night but not knee pain. ROS negative for red flags.    Limitations  Walking    How long can you sit comfortably?  1 hour     How long can you stand comfortably?  20 minuts    How long can you walk comfortably?  15-20 minutes d/t "being fatigued not pain"    Diagnostic tests  None related to knees or posterior hip    Patient Stated Goals  Decreased knee pain, "be able to walk farther." Decreased "sciatic nerve pain."    Currently in Pain?  No/denies    Pain Score  0-No pain    Pain Onset  More than a month ago    Pain Onset  More than a month ago         Therapeutic Activities: -Patient was able to complete 6MWT with PT with fatiigue noted at the end of walking and no pain' -PT helped patient complete survey of how her pain is affecting her daily activies -PT assessed patient's stair ambulation with unilateral handrail support to mimic home environment w/ step to pattern (which patient uses at home) and a reciprocal pattern. Patient was able to complete reciprocal pattern with PT cuing for glute activation. Patient reported she felt more steady with step-to pattern as her stairs at home are very steep. PT encouraged patient to use method that made her feel the most safe, and that continuing her HEP will increase confidence and decrease fall risk -Pt lead through a series of STS transfers w/ and w/o UE support where she was cued by PT for glute contraction  and full hip extension. Following form correction, patient reported less strain on bilat knees - HEP was reviewed to ensure safety in continuation at home and patient was instructed on how to progress HEP as needed                      PT Education - 09/27/17 1355    Education provided  Yes    Education Details  HEP reinforced and instruction on how to increase exercise if needed    Methods  Explanation;Handout  Comprehension  Verbalized understanding       PT Short Term Goals - 09/27/17 1412      PT SHORT TERM GOAL #1   Title  Pt will be independent with HEP in order to improve strength and decrease pain balance in order to improve pain-free function at home.    Status  Achieved        PT Long Term Goals - 09/27/17 1332      PT LONG TERM GOAL #1   Title  Pt will increase LEFS by at least 9 points in order to demonstrate significant improvement in lower extremity function.      Baseline  2/14 scored 46/80    Status  Achieved      PT LONG TERM GOAL #2   Title  Pt will decrease mODI scoreby at least 13 points in order demonstrate clinically significant reduction in pain/disability     Baseline  2/14  18%    Status  Achieved      PT LONG TERM GOAL #3   Title  Pt will decrease worst L hip and bilateral pain as reported on NPRS by at least 3 points in order to demonstrate clinically significant reduction in pain.    Baseline  2/14 pain only increases to 3/10    Status  Achieved      PT LONG TERM GOAL #4   Title  Pt will improve 57mT distance to at least 1,200 ft to demonstrate improve safety and ambulation in the community setting    Baseline  1,2158f   Status  Achieved            Plan - 09/27/17 1409    Clinical Impression Statement  Patient continues to report little to no pain, and has met all goals at this time. Patient was instructed to continue HEP and information on how to progress if needed and to contact PT in the future if she has any pain  exacerbation, balance deficits, weakness, etc. PT assessed patient's stair ambulation concerns and educated patient on proper form for safety to prevent fall risk. Patient agrees with POC cesation for now and verbalized understanding of HEP and future progression as needed.     Clinical Presentation  Stable    Rehab Potential  Good    PT Home Exercise Plan  Single knee to chest stretch 30s hold x 3 bilateral BID, bridges 2 x 10 BID; standing mini squats and hip / SLR with 1-2# ankle wt, standing hip ext/abd    Consulted and Agree with Plan of Care  Patient       Patient will benefit from skilled therapeutic intervention in order to improve the following deficits and impairments:     Visit Diagnosis: Pain in left hip  Chronic pain of left knee  Chronic pain of right knee     Problem List Patient Active Problem List   Diagnosis Date Noted  . Rheumatoid arthritis ([facility]12/14/2018  . Chest pain 07/16/2017  . DVT, lower extremity, distal, acute, bilateral ([facility]07/13/2018  . 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 Counseling regarding end of life decision making 01/14/2015  . Oral candidiasis 10/27/2014  . Short-term memory loss 10/17/2013  . Cerebral vascular disease 01/01/2013  . 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[facility]  . PVC's (premature ventricular contractions) 06/03/2012  . Post herpetic neuralgia 10/11/2011  . Osteoarthritis 08/10/2011  . Chronic fatigue 08/10/2011  .  Orthostatic dizziness 08/10/2011  . MYOCARDIAL INFARCTION, HX OF 08/01/2010  . 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 Fibromyalgia 05/20/2008  . Hyperlipidemia 05/29/2007  . OBSTRUCTIVE SLEEP APNEA 05/29/2007  . Essential hypertension, benign 05/29/2007  . ALLERGIC RHINITIS 05/29/2007  . ACNE, ROSACEA 05/29/2007   Shelton Silvas PT, DPT Shelton Silvas 09/27/2017, 2:14 PM  [facility] [address], [place], [address] Phone: [phone]   Fax:  [phone]  Name: Kristin [name] MRN: [id number] Date of Birth: Dec 23, 1940

## 2017-10-02 DIAGNOSIS — M0600 Rheumatoid arthritis without rheumatoid factor, unspecified site: Secondary | ICD-10-CM | POA: Diagnosis not present

## 2017-10-02 DIAGNOSIS — M1990 Unspecified osteoarthritis, unspecified site: Secondary | ICD-10-CM | POA: Diagnosis not present

## 2017-10-02 DIAGNOSIS — M25561 Pain in right knee: Secondary | ICD-10-CM | POA: Diagnosis not present

## 2017-10-02 DIAGNOSIS — L4050 Arthropathic psoriasis, unspecified: Secondary | ICD-10-CM | POA: Diagnosis not present

## 2017-10-02 DIAGNOSIS — M069 Rheumatoid arthritis, unspecified: Secondary | ICD-10-CM | POA: Diagnosis not present

## 2017-10-02 DIAGNOSIS — M5440 Lumbago with sciatica, unspecified side: Secondary | ICD-10-CM | POA: Diagnosis not present

## 2017-10-02 DIAGNOSIS — M797 Fibromyalgia: Secondary | ICD-10-CM | POA: Diagnosis not present

## 2017-10-02 DIAGNOSIS — Z79899 Other long term (current) drug therapy: Secondary | ICD-10-CM | POA: Diagnosis not present

## 2017-10-29 ENCOUNTER — Ambulatory Visit: Payer: PPO | Admitting: Podiatry

## 2017-11-05 ENCOUNTER — Other Ambulatory Visit: Payer: Self-pay | Admitting: Family Medicine

## 2017-11-05 NOTE — Telephone Encounter (Signed)
[facility];  Last refilled # 90 x 1 on 03/22/17 by Dr Diona Browner. Last seen; mentioned in office note 07/27/17; acute unrelated visit on 09/19/17.Please advise.

## 2017-11-05 NOTE — Telephone Encounter (Signed)
Refill of tramadol  Historical Provider  LOV 09/19/17  Dr. [facility] [phone] [facility] Drenda Freeze

## 2017-11-05 NOTE — Telephone Encounter (Signed)
Copied from [place] [phone]. Topic: Quick Communication - Rx Refill/Question >> Nov 05, 2017 10:47 AM Robina Ade, Helene Kelp D wrote: Medication: traMADol (ULTRAM) 50 MG tablet Has the patient contacted their pharmacy? No (Agent: If no, request that the patient contact the pharmacy for the refill.) Preferred Pharmacy (with phone number or street name): [place], [place] - [place]: Please be advised that RX refills may take up to 3 business days. We ask that you follow-up with your pharmacy.

## 2017-11-06 MED ORDER — TRAMADOL HCL 50 MG PO TABS: 50.0000 mg | ORAL_TABLET | Freq: Four times a day (QID) | ORAL | 1 refills | Status: DC | PRN

## 2017-11-22 DIAGNOSIS — D2272 Melanocytic nevi of left lower limb, including hip: Secondary | ICD-10-CM | POA: Diagnosis not present

## 2017-11-22 DIAGNOSIS — D2271 Melanocytic nevi of right lower limb, including hip: Secondary | ICD-10-CM | POA: Diagnosis not present

## 2017-11-22 DIAGNOSIS — D225 Melanocytic nevi of trunk: Secondary | ICD-10-CM | POA: Diagnosis not present

## 2017-11-22 DIAGNOSIS — D2261 Melanocytic nevi of right upper limb, including shoulder: Secondary | ICD-10-CM | POA: Diagnosis not present

## 2017-11-22 DIAGNOSIS — L821 Other seborrheic keratosis: Secondary | ICD-10-CM | POA: Diagnosis not present

## 2017-11-22 DIAGNOSIS — D2262 Melanocytic nevi of left upper limb, including shoulder: Secondary | ICD-10-CM | POA: Diagnosis not present

## 2017-11-22 DIAGNOSIS — L308 Other specified dermatitis: Secondary | ICD-10-CM | POA: Diagnosis not present

## 2017-12-21 DIAGNOSIS — H1013 Acute atopic conjunctivitis, bilateral: Secondary | ICD-10-CM | POA: Diagnosis not present

## 2018-02-19 ENCOUNTER — Telehealth: Payer: Self-pay | Admitting: Family Medicine

## 2018-02-19 DIAGNOSIS — E782 Mixed hyperlipidemia: Secondary | ICD-10-CM

## 2018-02-19 NOTE — Telephone Encounter (Signed)
-----   Message from Eustace Pen, LPN sent at 01/16/3328  3:54 PM EDT ----- Regarding: Labs 7/9 Lab orders needed. Thank you.  Insurance:  [employer]

## 2018-02-21 ENCOUNTER — Ambulatory Visit (INDEPENDENT_AMBULATORY_CARE_PROVIDER_SITE_OTHER): Payer: PPO

## 2018-02-21 ENCOUNTER — Ambulatory Visit: Payer: PPO

## 2018-02-21 VITALS — BP 140/80 | HR 59 | Temp 97.9°F | Ht 64.5 in | Wt 144.2 lb

## 2018-02-21 DIAGNOSIS — E782 Mixed hyperlipidemia: Secondary | ICD-10-CM | POA: Diagnosis not present

## 2018-02-21 DIAGNOSIS — Z0000 Encounter for general adult medical examination without abnormal findings: Secondary | ICD-10-CM

## 2018-02-21 LAB — COMPREHENSIVE METABOLIC PANEL
ALBUMIN: 3.9 g/dL (ref 3.5–5.2)
ALK PHOS: 67 U/L (ref 39–117)
ALT: 15 U/L (ref 0–35)
AST: 19 U/L (ref 0–37)
BUN: 26 mg/dL — AB (ref 6–23)
CALCIUM: 9.4 mg/dL (ref 8.4–10.5)
CO2: 31 mEq/L (ref 19–32)
Chloride: 102 mEq/L (ref 96–112)
Creatinine, Ser: 0.85 mg/dL (ref 0.40–1.20)
GFR: 68.85 mL/min (ref >60.00)
Glucose, Bld: 99 mg/dL (ref 70–99)
POTASSIUM: 3.8 meq/L (ref 3.5–5.1)
Sodium: 141 mEq/L (ref 135–145)
TOTAL PROTEIN: 7.3 g/dL (ref 6.0–8.3)
Total Bilirubin: 0.4 mg/dL (ref 0.2–1.2)

## 2018-02-21 LAB — LIPID PANEL
CHOLESTEROL: 229 mg/dL — AB (ref 0–200)
HDL: 73.2 mg/dL (ref >39.00)
LDL Cholesterol: 134 mg/dL — ABNORMAL HIGH (ref 0–99)
NonHDL: 155.44
TRIGLYCERIDES: 106 mg/dL (ref 0.0–149.0)
Total CHOL/HDL Ratio: 3
VLDL: 21.2 mg/dL (ref 0.0–40.0)

## 2018-02-21 NOTE — Patient Instructions (Signed)
Kristin [name] , Thank you for taking time to come for your Medicare Wellness Visit. I appreciate your ongoing commitment to your health goals. Please review the following plan we discussed and let me know if I can assist you in the future.   These are the goals we discussed: Goals    . Patient Stated     Starting 02/21/2018, I will continue to take medications as prescribed.        This is a list of the screening recommended for you and due dates:  Health Maintenance  Topic Date Due  . Flu Shot  03/14/2018  . Tetanus Vaccine  05/28/2025  . DEXA scan (bone density measurement)  Completed  . Pneumonia vaccines  Completed   Preventive Care for Adults  A healthy lifestyle and preventive care can promote health and wellness. Preventive health guidelines for adults include the following key practices.  . A routine yearly physical is a good way to check with your health care provider about your health and preventive screening. It is a chance to share any concerns and updates on your health and to receive a thorough exam.  . Visit your dentist for a routine exam and preventive care every 6 months. Brush your teeth twice a day and floss once a day. Good oral hygiene prevents tooth decay and gum disease.  . The frequency of eye exams is based on your age, health, family medical history, use  of contact lenses, and other factors. Follow your health care provider's recommendations for frequency of eye exams.  . Eat a healthy diet. Foods like vegetables, fruits, whole grains, low-fat dairy products, and lean protein foods contain the nutrients you need without too many calories. Decrease your intake of foods high in solid fats, added sugars, and salt. Eat the right amount of calories for you. Get information about a proper diet from your health care provider, if necessary.  . Regular physical exercise is one of the most important things you can do for your health. Most adults should get at least 150  minutes of moderate-intensity exercise (any activity that increases your heart rate and causes you to sweat) each week. In addition, most adults need muscle-strengthening exercises on 2 or more days a week.  Silver Sneakers may be a benefit available to you. To determine eligibility, you may visit the website: www.silversneakers.com or contact program at [phone] Mon-Fri between 8AM-8PM.   . Maintain a healthy weight. The body mass index (BMI) is a screening tool to identify possible weight problems. It provides an estimate of body fat based on height and weight. Your health care provider can find your BMI and can help you achieve or maintain a healthy weight.   For adults 20 years and older: ? A BMI below 18.5 is considered underweight. ? A BMI of 18.5 to 24.9 is normal. ? A BMI of 25 to 29.9 is considered overweight. ? A BMI of 30 and above is considered obese.   . Maintain normal blood lipids and cholesterol levels by exercising and minimizing your intake of saturated fat. Eat a balanced diet with plenty of fruit and vegetables. Blood tests for lipids and cholesterol should begin at age 62 and be repeated every 5 years. If your lipid or cholesterol levels are high, you are over 50, or you are at high risk for heart disease, you may need your cholesterol levels checked more frequently. Ongoing high lipid and cholesterol levels should be treated with medicines if diet and exercise  are not working.  . If you smoke, find out from your health care provider how to quit. If you do not use tobacco, please do not start.  . If you choose to drink alcohol, please do not consume more than 2 drinks per day. One drink is considered to be 12 ounces (355 mL) of beer, 5 ounces (148 mL) of wine, or 1.5 ounces (44 mL) of liquor.  . If you are 61-51 years old, ask your health care provider if you should take aspirin to prevent strokes.  . Use sunscreen. Apply sunscreen liberally and repeatedly throughout  the day. You should seek shade when your shadow is shorter than you. Protect yourself by wearing long sleeves, pants, a wide-brimmed hat, and sunglasses year round, whenever you are outdoors.  . Once a month, do a whole body skin exam, using a mirror to look at the skin on your back. Tell your health care provider of new moles, moles that have irregular borders, moles that are larger than a pencil eraser, or moles that have changed in shape or color.

## 2018-02-21 NOTE — Progress Notes (Signed)
PCP notes:   Health maintenance:  No gaps identified.  Abnormal screenings:   Hearing - failed  Hearing Screening   125Hz  250Hz  500Hz  1000Hz  2000Hz  3000Hz  4000Hz  6000Hz  8000Hz   Right ear:   0 0 40  40    Left ear:   40 40 40  40     Mini-Cog score: 17/20 MMSE - Mini Mental State Exam 02/21/2018 02/19/2017 01/28/2016  Orientation to time 5 3 5   Orientation to time comments - Does not know date or day of the week. -  Orientation to Place 5 5 5   Registration 3 3 3   Attention/ Calculation 0 0 0  Recall 0 0 0  Recall-comments unable to recall 3 of 3 words - pt was unable to recall 3 of 3 words  Language- name 2 objects 0 0 0  Language- repeat 1 1 1   Language- follow 3 step command 3 3 3   Language- read & follow direction 0 0 0  Write a sentence 0 0 0  Copy design 0 0 0  Total score 17 15 17     Patient concerns:   None  Nurse concerns:  None  Next PCP appt:   03/01/18 @ 1115

## 2018-02-21 NOTE — Progress Notes (Signed)
I reviewed health advisor's note, was available for consultation, and agree with documentation and plan.  

## 2018-02-21 NOTE — Progress Notes (Signed)
Subjective:   Kristin [name] is a 77 y.o. female who presents for Medicare Annual (Subsequent) preventive examination.  Review of Systems:  N/A Cardiac Risk Factors include: advanced age (>76men, >106 women);hypertension;dyslipidemia     Objective:     Vitals: BP 140/80 (BP Location: Right Arm, Patient Position: Sitting, Cuff Size: Normal)   Pulse (!) 59   Temp 97.9 F (36.6 C) (Oral)   Ht 5' 4.5" (1.638 m) Comment: shoes  Wt 144 lb 4 oz (65.4 kg)   LMP 08/14/1989   SpO2 96%   BMI 24.38 kg/m   Body mass index is 24.38 kg/m.  Advanced Directives 02/21/2018 08/21/2017 06/18/2017 02/19/2017 02/08/2017 01/28/2016 08/11/2014  Does Patient Have a Medical Advance Directive? Yes Yes Yes Yes No Yes Yes  Type of Paramedic of [place];Living will Living will [place];Living will [place];Living will - [place];Living will [place];Living will  Does patient want to make changes to medical advance directive? - - - - - No - Patient declined -  Copy of [place] in Chart? No - copy requested - - No - copy requested - Yes No - copy requested  Would patient like information on creating a medical advance directive? - - - - No - Patient declined - -    Tobacco Social History   Tobacco Use  Smoking Status Never Smoker  Smokeless Tobacco Never Used     Counseling given: No   Clinical Intake:  Pre-visit preparation completed: Yes  Pain : 0-10 Pain Score: 5  Pain Onset: More than a month ago Pain Frequency: Constant     Nutritional Status: BMI of 19-24  Normal Nutritional Risks: None Diabetes: No  How often do you need to have someone help you when you read instructions, pamphlets, or other written materials from your doctor or pharmacy?: 1 - Never What is the last grade level you completed in school?: Bachelors degree  Interpreter Needed?: No  Comments: pt  lives with spouse Information entered by :: LPinson, LPN  Past Medical History:  Diagnosis Date  . Arthritis    recent falls -aug 2010  . [name] (coronary artery disease)    mi-1996  . Complication of anesthesia    pt states takes a long time to wake her wake up  . 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 Cough    no fever   . Degeneration of lumbar or lumbosacral intervertebral disc   . Dizziness    r/t meds  . Fibromyalgia   . History of shingles   . [name] (irritable bowel syndrome)    takes OTC [place] Negus colon health  . Joint pain   . Joint swelling   . Myalgia and myositis, unspecified   . Obstructive sleep apnea (adult) (pediatric)   . Old myocardial infarction 1996  . Osteoarthritis   . Osteoarthrosis, unspecified whether generalized or localized, unspecified site   . Psoriatic arthritis ([place])   . Rheumatoid arthritis ([place])   . Rosacea   . Seasonal allergies   . Stroke [facility])    ischaemic microvascular disease  . 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 Unspecified essential hypertension    takes Diltiazem,Metoprolol,and Losartan daily  . UTI (lower urinary tract infection)    frequent=last one was winter of 2014   Past  Surgical History:  Procedure Laterality Date  . ATHERECTOMY  1996  . [place]   vasospams; no blockage  . CARDIAC CATHETERIZATION  2013   No obstructive coronary artery disease. Mild distal LAD and proximal RCA disease.  [name] CATARACT EXTRACTION Bilateral   . COLONOSCOPY    . CT ABD WO/W & PELVIS WO CM  2013   no acute process, + minimal diverticulosis and hepatic/renal cysts, ATH calcifications  . ETHMOIDECTOMY  1998  . 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 NASAL SINUS SURGERY  1998  . OOPHORECTOMY     BSO  . sinus biopsy  1998   inverted papiloma  . VAGINAL HYSTERECTOMY  1992   for mennorhagia-LAVH BSO   Family History  Problem Relation Age of Onset  . Colon cancer Father   . Heart attack Mother   . Coronary artery disease Mother   . Osteoporosis Mother   . Heart disease Mother   . Heart failure Mother   . GER disease Sister   . Hypertension Sister   . Osteopenia Sister   . GER disease Sister   . Breast cancer Paternal Aunt        age 34  . Heart failure Maternal Grandmother   . Heart failure Maternal Grandfather    Social History   Socioeconomic History  . Marital status: Married    Spouse name: Not on file  . Number of children: 2  . Years of education: BSN  . Highest education level: Not on file  Occupational History  . 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 Financial resource strain: Not on file  . Food insecurity:    Worry: Not on file    Inability: Not on file  . Transportation needs:    Medical: Not on file    Non-medical: Not on file  Tobacco Use  . Smoking status: Never Smoker  . Smokeless tobacco: Never Used  Substance and Sexual Activity  . Alcohol use: No    Alcohol/week: 0.0 oz    Comment: Rare  . Drug use: No  . Sexual activity: Not Currently    Birth control/protection: Surgical    Comment: 1st intercourse 66 yo-2 partners  Lifestyle  . Physical activity:    Days per week: Not on file    Minutes per session: Not on file  . Stress: Not on file  Relationships  . Social connections:    Talks on phone: Not on file    Gets together: Not on file    Attends religious service: Not on file    Active member of club or organization: Not on file    Attends meetings of clubs or organizations: Not on file    Relationship status: Not on file  Other Topics Concern  . Not on file  Social History Narrative   No  regular exercise-used to do [place] will.. She is DNR, Kristin [name] is husband Kristin [name] , then son Kristin [name]       10/22/12-cardiac work up found the reason for her dizziness,  she needs no neurological follow up for that.  her enthusiasm for CPAP remains  limited and is explained by her resiudal high AHI after she finally reached compliance ,[place] 13 points.     95% pressure is 11.7 cm , AHI  20 .7- she does not benefit from CPAP . She is allowed to d/c the machine and we need to concerntrate on her dizziness and facial right droop. ordered MRI brain repeat.    Eye exam next week. PT at [facility]. MRI brain in April- per patients request order next month.           Outpatient Encounter Medications as of 02/21/2018  Medication Sig  . acetaminophen (TYLENOL) 325 MG tablet Take 325 mg by mouth every 8 (eight) hours as needed (pain).   . ASPIRIN 81 PO Take 1 tablet by mouth daily.  . Calcium Carbonate-Vitamin D (CALTRATE 600+D PO) Take 1 tablet by mouth 2 (two) times daily.   [name] Dextromethorphan Polistirex (DELSYM PO) Take by mouth as needed.  . diltiazem (CARDIZEM CD) 240 MG 24 hr capsule TAKE ONE CAPSULE BY MOUTH DAILY  . DOXYLAMINE SUCCINATE PO Take 25 mg by mouth at bedtime.  [name] FOLIC ACID PO Take 482 mg by mouth daily.   . Ibuprofen (MOTRIN PO) Take 1 tablet by mouth at bedtime.  . Multiple Vitamin (MULTIVITAMIN) tablet Take 1 tablet by mouth daily.    . nitroGLYCERIN (NITROSTAT) 0.4 MG SL tablet Place 1 tablet (0.4 mg total) every 5 (five) minutes as needed under the tongue for chest pain.  . Omega-3 Fatty Acids (FISH OIL) 1200 MG CAPS Take 1 capsule by mouth 2 (two) times daily.   [name] OVER THE COUNTER MEDICATION Take 3 tablets by mouth daily.  Vladimir Faster Glycol-Propyl Glycol (SYSTANE) 0.4-0.3 % SOLN Apply 1 drop to eye daily as needed (dry eyes).   . Probiotic Product ([place]) CAPS Take 1 capsule by mouth daily.  . traMADol (ULTRAM) 50 MG tablet  Take 1 tablet (50 mg total) by mouth every 6 (six) hours as needed.  . triamcinolone cream (KENALOG) 0.1 %   . vitamin B-12 (CYANOCOBALAMIN) 1000 MCG tablet Take 1,000 mcg by mouth daily.   No facility-administered encounter medications on file as of 02/21/2018.     Activities of Daily Living In your present state of health, do you have any difficulty performing the following activities: 02/21/2018  Hearing? N  Vision? N  Difficulty concentrating or making decisions? Y  Walking or climbing stairs? Y  Dressing or bathing? N  Doing errands, shopping? N  Preparing Food and eating ? N  Using the Toilet? N  In the past six months, have you accidently leaked urine? N  Do you have problems with loss of bowel control? N  Managing your Medications? N  Managing your Finances? N  Housekeeping or managing your Housekeeping? N  Some recent data might be hidden    Patient Care Team: Jinny Sanders, MD as PCP - General Wellington Hampshire, MD as Consulting Physician (Cardiology) Birder Robson, MD as Referring Physician (Ophthalmology) Oneta Rack, MD as Consulting Physician (Dermatology) Valinda Party, MD as Consulting Physician (Rheumatology) Vicenta Aly, [place] (Chiropractic Medicine)    Assessment:   This is a routine wellness examination for [place].   Hearing Screening   125Hz  250Hz  500Hz  1000Hz  2000Hz  3000Hz  4000Hz  6000Hz  8000Hz   Right ear:   0 0 40  40    Left ear:   40 40 40  40    Vision Screening Comments: June 2019 with Dr. George Ina   Exercise  Activities and Dietary recommendations Current Exercise Habits: Home exercise routine, Type of exercise: walking, Time (Minutes): 20, Frequency (Times/Week): 7, Weekly Exercise (Minutes/Week): 140, Intensity: Mild, Exercise limited by: None identified  Goals    . Patient Stated     Starting 02/21/2018, I will continue to take medications as prescribed.        Fall Risk Fall Risk  02/21/2018 02/19/2017 01/28/2016 01/14/2015  10/17/2013  Falls in the past year? No No No No Yes  Number falls in past yr: - - - - 1  Injury with Fall? - - - - Yes  Risk Factor Category  - - - - -  Risk for fall due to : - - - - History of fall(s)  Risk for fall due to: Comment - - - - -   Depression Screen PHQ 2/9 Scores 02/21/2018 02/19/2017 01/28/2016 01/14/2015  PHQ - 2 Score 0 0 0 0  PHQ- 9 Score 0 - - -     Cognitive Function MMSE - Mini Mental State Exam 02/21/2018 02/19/2017 01/28/2016  Orientation to time 5 3 5   Orientation to time comments - Does not know date or day of the week. -  Orientation to Place 5 5 5   Registration 3 3 3   Attention/ Calculation 0 0 0  Recall 0 0 0  Recall-comments unable to recall 3 of 3 words - pt was unable to recall 3 of 3 words  Language- name 2 objects 0 0 0  Language- repeat 1 1 1   Language- follow 3 step command 3 3 3   Language- read & follow direction 0 0 0  Write a sentence 0 0 0  Copy design 0 0 0  Total score 17 15 17      PLEASE NOTE: A Mini-Cog screen was completed. Maximum score is 20. A value of 0 denotes this part of [place] MMSE was not completed or the patient failed this part of the Mini-Cog screening.   Mini-Cog Screening Orientation to Time - Max 5 pts Orientation to Place - Max 5 pts Registration - Max 3 pts Recall - Max 3 pts Language Repeat - Max 1 pts Language Follow 3 Step Command - Max 3 pts     Immunization History  Administered Date(s) Administered  . Influenza Split 05/16/2012  . Influenza Whole 05/15/2007, 05/20/2008, 05/14/2009, 05/14/2010  . Influenza,inj,Quad PF,6+ Mos 05/13/2016, 06/12/2017  . Influenza-Unspecified 05/14/2013, 06/06/2014  . Pneumococcal Conjugate-13 10/17/2013  . Pneumococcal Polysaccharide-23 08/14/2005, 02/19/2017  . Td 08/14/2002  . Zoster 08/14/2005, 05/18/2008    Screening Tests Health Maintenance  Topic Date Due  . INFLUENZA VACCINE  03/14/2018  . TETANUS/TDAP  05/28/2025  . DEXA SCAN  Completed  . PNA vac Low Risk Adult   Completed       Plan:     I have personally reviewed, addressed, and noted the following in the patient's chart:  A. Medical and social history B. Use of alcohol, tobacco or illicit drugs  C. Current medications and supplements D. Functional ability and status E.  Nutritional status F.  Physical activity G. Advance directives H. List of other physicians I.  Hospitalizations, surgeries, and ER visits in previous 12 months J.  [place] to include hearing, vision, cognitive, depression L. Referrals and appointments - none  In addition, I have reviewed and discussed with patient certain preventive protocols, quality metrics, and best practice recommendations. A written personalized care plan for preventive services as well as general preventive health recommendations were provided  to patient.  See attached scanned questionnaire for additional information.   Signed,   Lindell Noe, MHA, BS, LPN Health Coach

## 2018-03-01 ENCOUNTER — Ambulatory Visit (INDEPENDENT_AMBULATORY_CARE_PROVIDER_SITE_OTHER): Payer: PPO | Admitting: Family Medicine

## 2018-03-01 ENCOUNTER — Encounter: Payer: Self-pay | Admitting: Family Medicine

## 2018-03-01 VITALS — BP 130/82 | HR 70 | Temp 97.9°F | Wt 142.5 lb

## 2018-03-01 DIAGNOSIS — E782 Mixed hyperlipidemia: Secondary | ICD-10-CM

## 2018-03-01 DIAGNOSIS — R5382 Chronic fatigue, unspecified: Secondary | ICD-10-CM | POA: Diagnosis not present

## 2018-03-01 DIAGNOSIS — R413 Other amnesia: Secondary | ICD-10-CM | POA: Diagnosis not present

## 2018-03-01 DIAGNOSIS — M0579 Rheumatoid arthritis with rheumatoid factor of multiple sites without organ or systems involvement: Secondary | ICD-10-CM | POA: Diagnosis not present

## 2018-03-01 DIAGNOSIS — E348 Other specified endocrine disorders: Secondary | ICD-10-CM | POA: Diagnosis not present

## 2018-03-01 DIAGNOSIS — Z1231 Encounter for screening mammogram for malignant neoplasm of breast: Secondary | ICD-10-CM | POA: Diagnosis not present

## 2018-03-01 DIAGNOSIS — I10 Essential (primary) hypertension: Secondary | ICD-10-CM

## 2018-03-01 DIAGNOSIS — Z0000 Encounter for general adult medical examination without abnormal findings: Secondary | ICD-10-CM | POA: Diagnosis not present

## 2018-03-01 NOTE — Assessment & Plan Note (Signed)
Pt refuses medication of any type to treat.  per Dr. Fletcher Anon: No indication for treatment with a statin given that she has no significant atherosclerosis. 10 year risk of atherosclerotic cardiovascular disease is calculated to be 11.1%. However, most of this is driven by age.

## 2018-03-01 NOTE — Assessment & Plan Note (Signed)
Pain well controlled on tramadol at bedtime 2-3 times a week.

## 2018-03-01 NOTE — Assessment & Plan Note (Signed)
Likely multifactorial due to meds and untreated sleep apnea.

## 2018-03-01 NOTE — Patient Instructions (Addendum)
Please stop at the front desk to set up referral.  Return to the gym and walk 3 times a week. Try to wear oral appliance for  sleep apnea.  Decreased animals fat In diet ie. ice cream.

## 2018-03-01 NOTE — Assessment & Plan Note (Signed)
Well controlled. Continue current medication.  

## 2018-03-01 NOTE — Progress Notes (Signed)
Subjective:    Patient ID: Kristin [name], female    DOB: 1941/04/25, 77 y.o.   MRN: [id number]  HPI   The patient presents for complete physical and review of chronic health problems. He/She also has the following acute concerns today: None  The patient saw Candis Musa, LPN for medicare wellness. Note reviewed in detail and important notes copied below. Health maintenance:  No gaps identified.  Abnormal screenings:   Hearing - failed             Hearing Screening   125Hz  250Hz  500Hz  1000Hz  2000Hz  3000Hz  4000Hz  6000Hz  8000Hz   Right ear:   0 0 40  40    Left ear:   40 40 40  40     Mini-Cog score: 17/20 MMSE - Mini Mental State Exam 02/21/2018 02/19/2017 01/28/2016  Orientation to time 5 3 5   Orientation to time comments - Does not know date or day of the week. -  Orientation to Place 5 5 5   Registration 3 3 3   Attention/ Calculation 0 0 0  Recall 0 0 0  Recall-comments unable to recall 3 of 3 words - pt was unable to recall 3 of 3 words  Language- name 2 objects 0 0 0  Language- repeat 1 1 1   Language- follow 3 step command 3 3 3   Language- read & follow direction 0 0 0  Write a sentence 0 0 0  Copy design 0 0 0  Total score 17 15 17     Patient concerns:   None   03/01/18 Today  Hypertension:   good control on diltiazem, losartan HCTZ, spironolactine... Followed by Dr.    BP Readings from Last 3 Encounters:  03/01/18 130/82  02/21/18 140/80  07/31/17 132/62  Using medication without problems or lightheadedness: none Chest pain with exertion:none Edema:none Short of breath: occ Average home BPs: Other issues:  Elevated Cholesterol:  Not at goal LDL <70 on no med. Lab Results  Component Value Date   CHOL 229 (H) 02/21/2018   HDL 73.20 02/21/2018   LDLCALC 134 (H) 02/21/2018   LDLDIRECT 158.1 10/02/2013   TRIG 106.0 02/21/2018   CHOLHDL 3 02/21/2018  Using medications without problems: Muscle aches:  Diet compliance:  mode Exercise: plans to return to gym, walking Other complaints: [name].[name] Followed by Dr. Fletcher Anon  Her husband  notes memory issue, mild gradually worsening some. Pt denies memory issues.  No family history of memory issues.  Fibromyalgia/inflammatory arthritis: Rheum eval in past. Moderate control on tramadol 50 mg daily, does make her somewhat sleepy.  Trial of methotrexate. MD recommended Enbrel.. She decided against it... Rheum no longer following her. She has chronic fatigue. No insomnia.   Social History /Family History/Past Medical History reviewed in detail and updated in EMR if needed. Blood pressure 130/82, pulse 70, temperature 97.9 F (36.6 C), temperature source Oral, weight 142 lb 8 oz (64.6 kg), last menstrual period 08/14/1989, SpO2 98 %.  Review of Systems  Constitutional: Negative for fatigue and fever.  HENT: Negative for congestion.   Eyes: Negative for pain.  Respiratory: Negative for cough and shortness of breath.   Cardiovascular: Negative for chest pain, palpitations and leg swelling.  Gastrointestinal: Negative for abdominal pain.  Genitourinary: Negative for dysuria and vaginal bleeding.  Musculoskeletal: Positive for arthralgias and myalgias. Negative for back pain.  Neurological: Negative for syncope, light-headedness and headaches.  Psychiatric/Behavioral: Negative for dysphoric mood.       Objective:   Physical Exam  Constitutional: Vital signs are normal. She appears well-developed and well-nourished. She is cooperative.  Non-toxic appearance. She does not appear ill. No distress.  HENT:  Head: Normocephalic.  Right Ear: Hearing, tympanic membrane, external ear and ear canal normal.  Left Ear: Hearing, tympanic membrane, external ear and ear canal normal.  Nose: Nose normal.  Right ear cerumen irrigated  Eyes: Pupils are equal, round, and reactive to light. Conjunctivae, EOM and lids are normal. Lids are everted and swept, no foreign bodies found.   Neck: Trachea normal and normal range of motion. Neck supple. Carotid bruit is not present. No thyroid mass and no thyromegaly present.  Cardiovascular: Normal rate, regular rhythm, S1 normal, S2 normal, normal heart sounds and intact distal pulses. Exam reveals no gallop.  No murmur heard. Pulmonary/Chest: Effort normal and breath sounds normal. No respiratory distress. She has no wheezes. She has no rhonchi. She has no rales.  Abdominal: Soft. Normal appearance and bowel sounds are normal. She exhibits no distension, no fluid wave, no abdominal bruit and no mass. There is no hepatosplenomegaly. There is no tenderness. There is no rebound, no guarding and no CVA tenderness. No hernia.  Musculoskeletal:  Multiple fibromyalgia trigger points.  Lymphadenopathy:    She has no cervical adenopathy.    She has no axillary adenopathy.  Neurological: She is alert. She has normal strength. No cranial nerve deficit or sensory deficit.  Skin: Skin is warm, dry and intact. No rash noted.  Psychiatric: Her speech is normal and behavior is normal. Judgment normal. Her mood appears not anxious. Cognition and memory are normal. She does not exhibit a depressed mood.          Assessment & Plan:  The patient's preventative maintenance and recommended screening tests for an annual wellness exam were reviewed in full today. Brought up to date unless services declined.  Counselled on the importance of diet, exercise, and its role in overall health and mortality. The patient's FH and SH was reviewed, including their home life, tobacco status, and drug and alcohol status.    Vaccines: uptodate Mammogram: 04/2015 nml.. Repeat due DVE/pap: not indicated, given age and S/P vaginal hysterectomy for noncancer Colon: 2011.. Nml, likely no repeat needed.  DEXA: 03/2012, nml repeat in due Nonsmoker

## 2018-03-01 NOTE — Assessment & Plan Note (Signed)
Neg lab evsal. Refuses CPAP treatment of sleep apnea.. Will try yo wear oral appliance.  Re-eval with MMSE in 3 months.. Consider aricept etc.  Limit tramadol.

## 2018-04-08 ENCOUNTER — Ambulatory Visit
Admission: RE | Admit: 2018-04-08 | Discharge: 2018-04-08 | Disposition: A | Discharge status: Home / Self Care | Payer: PPO | Source: Ambulatory Visit | Attending: Family Medicine | Admitting: Family Medicine

## 2018-04-08 DIAGNOSIS — E348 Other specified endocrine disorders: Secondary | ICD-10-CM

## 2018-04-08 DIAGNOSIS — M85852 Other specified disorders of bone density and structure, left thigh: Secondary | ICD-10-CM | POA: Diagnosis not present

## 2018-04-08 DIAGNOSIS — Z1231 Encounter for screening mammogram for malignant neoplasm of breast: Secondary | ICD-10-CM

## 2018-04-08 DIAGNOSIS — Z13820 Encounter for screening for osteoporosis: Secondary | ICD-10-CM | POA: Insufficient documentation

## 2018-06-13 ENCOUNTER — Other Ambulatory Visit: Payer: Self-pay | Admitting: Cardiovascular Disease

## 2018-06-14 ENCOUNTER — Encounter: Payer: Self-pay | Admitting: Emergency Medicine

## 2018-06-14 ENCOUNTER — Ambulatory Visit: Payer: PPO | Admitting: Family Medicine

## 2018-06-14 ENCOUNTER — Emergency Department: Payer: PPO

## 2018-06-14 ENCOUNTER — Emergency Department
Admission: EM | Admit: 2018-06-14 | Discharge: 2018-06-14 | Disposition: A | Discharge status: Home / Self Care | Payer: PPO | Attending: Emergency Medicine | Admitting: Emergency Medicine

## 2018-06-14 ENCOUNTER — Other Ambulatory Visit: Payer: Self-pay

## 2018-06-14 ENCOUNTER — Telehealth: Payer: Self-pay | Admitting: *Deleted

## 2018-06-14 DIAGNOSIS — R519 Headache, unspecified: Secondary | ICD-10-CM

## 2018-06-14 DIAGNOSIS — Z8673 Personal history of transient ischemic attack (TIA), and cerebral infarction without residual deficits: Secondary | ICD-10-CM | POA: Insufficient documentation

## 2018-06-14 DIAGNOSIS — I2510 Atherosclerotic heart disease of native coronary artery without angina pectoris: Secondary | ICD-10-CM | POA: Insufficient documentation

## 2018-06-14 DIAGNOSIS — I252 Old myocardial infarction: Secondary | ICD-10-CM | POA: Insufficient documentation

## 2018-06-14 DIAGNOSIS — Z7982 Long term (current) use of aspirin: Secondary | ICD-10-CM | POA: Diagnosis not present

## 2018-06-14 DIAGNOSIS — J0101 Acute recurrent maxillary sinusitis: Secondary | ICD-10-CM | POA: Diagnosis not present

## 2018-06-14 DIAGNOSIS — R000 Tachycardia, unspecified: Secondary | ICD-10-CM | POA: Diagnosis not present

## 2018-06-14 DIAGNOSIS — R51 Headache: Secondary | ICD-10-CM | POA: Diagnosis not present

## 2018-06-14 DIAGNOSIS — Z79899 Other long term (current) drug therapy: Secondary | ICD-10-CM | POA: Insufficient documentation

## 2018-06-14 DIAGNOSIS — H538 Other visual disturbances: Secondary | ICD-10-CM | POA: Insufficient documentation

## 2018-06-14 DIAGNOSIS — R42 Dizziness and giddiness: Secondary | ICD-10-CM | POA: Insufficient documentation

## 2018-06-14 DIAGNOSIS — I10 Essential (primary) hypertension: Secondary | ICD-10-CM | POA: Diagnosis not present

## 2018-06-14 LAB — BASIC METABOLIC PANEL
ANION GAP: 10 (ref 5–15)
BUN: 24 mg/dL — ABNORMAL HIGH (ref 8–23)
CALCIUM: 9.7 mg/dL (ref 8.9–10.3)
CO2: 25 mmol/L (ref 22–32)
Chloride: 105 mmol/L (ref 98–111)
Creatinine, Ser: 0.88 mg/dL (ref 0.44–1.00)
Glucose, Bld: 112 mg/dL — ABNORMAL HIGH (ref 70–99)
Potassium: 3.3 mmol/L — ABNORMAL LOW (ref 3.5–5.1)
Sodium: 140 mmol/L (ref 135–145)

## 2018-06-14 LAB — CBC WITH DIFFERENTIAL/PLATELET
ABS IMMATURE GRANULOCYTES: 0.04 10*3/uL (ref 0.00–0.07)
BASOS PCT: 1 %
Basophils Absolute: 0.1 10*3/uL (ref 0.0–0.1)
Eosinophils Absolute: 0.1 10*3/uL (ref 0.0–0.5)
Eosinophils Relative: 1 %
HCT: 40.2 % (ref 36.0–46.0)
HEMOGLOBIN: 13.4 g/dL (ref 12.0–15.0)
IMMATURE GRANULOCYTES: 1 %
LYMPHS PCT: 20 %
Lymphs Abs: 1.8 10*3/uL (ref 0.7–4.0)
MCH: 31.8 pg (ref 26.0–34.0)
MCHC: 33.3 g/dL (ref 30.0–36.0)
MCV: 95.5 fL (ref 80.0–100.0)
MONOS PCT: 8 %
Monocytes Absolute: 0.7 10*3/uL (ref 0.1–1.0)
NEUTROS ABS: 6.2 10*3/uL (ref 1.7–7.7)
NEUTROS PCT: 69 %
PLATELETS: 209 10*3/uL (ref 150–400)
RBC: 4.21 MIL/uL (ref 3.87–5.11)
RDW: 13.7 % (ref 11.5–15.5)
WBC: 8.9 10*3/uL (ref 4.0–10.5)
nRBC: 0 % (ref 0.0–0.2)

## 2018-06-14 LAB — URINALYSIS, COMPLETE (UACMP) WITH MICROSCOPIC
BILIRUBIN URINE: NEGATIVE
Bacteria, UA: NONE SEEN
Glucose, UA: NEGATIVE mg/dL
HGB URINE DIPSTICK: NEGATIVE
Ketones, ur: NEGATIVE mg/dL
Leukocytes, UA: NEGATIVE
NITRITE: NEGATIVE
Protein, ur: NEGATIVE mg/dL
SPECIFIC GRAVITY, URINE: 1.006 (ref 1.005–1.030)
pH: 6 (ref 5.0–8.0)

## 2018-06-14 LAB — TROPONIN I: Troponin I: <0.03 ng/mL (ref <0.03)

## 2018-06-14 LAB — SEDIMENTATION RATE: Sed Rate: 4 mm/hr (ref 0–30)

## 2018-06-14 MED ORDER — IOPAMIDOL (ISOVUE-370) INJECTION 76%
75.0000 mL | Freq: Once | INTRAVENOUS | Status: AC | PRN
  Administered 2018-06-14: 75 mL via INTRAVENOUS

## 2018-06-14 MED ORDER — MECLIZINE HCL 25 MG PO TABS: 25.0000 mg | ORAL_TABLET | Freq: Three times a day (TID) | ORAL | 0 refills | Status: DC | PRN

## 2018-06-14 MED ORDER — MECLIZINE HCL 25 MG PO TABS
25.0000 mg | ORAL_TABLET | Freq: Once | ORAL | Status: AC
  Administered 2018-06-14: 25 mg via ORAL
  Filled 2018-06-14: qty 1

## 2018-06-14 MED ORDER — ACETAMINOPHEN 500 MG PO TABS
1000.0000 mg | ORAL_TABLET | Freq: Once | ORAL | Status: AC
  Administered 2018-06-14: 1000 mg via ORAL
  Filled 2018-06-14: qty 2

## 2018-06-14 MED ORDER — OXYMETAZOLINE HCL 0.05 % NA SOLN
1.0000 | Freq: Once | NASAL | Status: AC
  Administered 2018-06-14: 1 via NASAL
  Filled 2018-06-14: qty 15

## 2018-06-14 MED ORDER — OXYMETAZOLINE HCL 0.05 % NA SOLN: 2.0000 | Freq: Two times a day (BID) | NASAL | 0 refills | Status: AC

## 2018-06-14 NOTE — ED Provider Notes (Signed)
----------------------------------------- 4:24 PM on 06/14/2018 -----------------------------------------   Blood pressure (!) 160/54, pulse 62, temperature 98.1 F (36.7 C), temperature source Oral, resp. rate 18, height 5\' 7"  (1.702 m), weight 64.4 kg, last menstrual period 08/14/1989, SpO2 98 %.  Assuming care from Dr. Clearnce Hasten of [facility] is a 77 y.o. female with a chief complaint of Dizziness and Headache .    Please refer to H&P by previous MD for further details.  The current plan of care is to f/u imaging studies.  I have personally reviewed the images performed during this visit and I agree with the Radiologist's read.   Interpretation by Radiologist:  Ct Angio Head W Or Wo Contrast  Result Date: 06/14/2018 CLINICAL DATA:  Dizziness and headache worsening over the last 3 days. EXAM: CT ANGIOGRAPHY HEAD AND NECK TECHNIQUE: Multidetector CT imaging of the head and neck was performed using the standard protocol during bolus administration of intravenous contrast. Multiplanar CT image reconstructions and MIPs were obtained to evaluate the vascular anatomy. Carotid stenosis measurements (when applicable) are obtained utilizing NASCET criteria, using the distal internal carotid diameter as the denominator. CONTRAST:  80mL ISOVUE-370 IOPAMIDOL (ISOVUE-370) INJECTION 76% COMPARISON:  06/18/2017 CT head. FINDINGS: CT HEAD FINDINGS Brain: No evidence of acute infarction, hemorrhage, hydrocephalus, extra-axial collection or mass lesion/mass effect. Stable chronic microvascular ischemic changes and volume loss of the brain. 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 Vascular: As below. Skull: Normal. Negative for fracture or focal lesion. Sinuses: Left maxillary antrostomy and ethmoidectomy chronic postsurgical changes. Moderate mucosal thickening of the left maxillary sinus. Normal aeration of mastoid air cells. Orbits: Bilateral intra-ocular lens replacement. Review of the MIP  images confirms the above findings CTA NECK FINDINGS Aortic arch: Bovine variant branching. Imaged portion shows no evidence of aneurysm or dissection. No significant stenosis of the major arch vessel origins. Mild aortic calcific atherosclerosis. Right carotid system: No evidence of dissection, stenosis (50% or greater) or occlusion. Mild non stenotic calcific atherosclerosis of carotid bifurcation. Left carotid system: No evidence of dissection, stenosis (50% or greater) or occlusion. Moderate non stenotic calcific atherosclerosis of the carotid bifurcation. Vertebral arteries: Codominant. No evidence of dissection, stenosis (50% or greater) or occlusion. Skeleton: C2-3 vertebral body and facet fusion on congenital basis. C4-5 grade 1 anterolisthesis. Moderate cervical spondylosis with prominent facet arthropathy. Other neck: Thyroid nodules measuring up to 13 mm in the right lobe. Upper chest: Right lung apex 3 mm perifissural nodule, likely intrapulmonary lymph node. Review of the MIP images confirms the above findings CTA HEAD FINDINGS Anterior circulation: No significant stenosis, proximal occlusion, aneurysm, or vascular malformation. Non stenotic calcific atherosclerosis of carotid siphons. Posterior circulation: No significant stenosis, proximal occlusion, aneurysm, or vascular malformation. Right P2 mild-to-moderate stenosis. Venous sinuses: As permitted by contrast timing, patent. Incidental subcentimeter right cavernous sinus lipoma. Anatomic variants: Complete circle-of-Willis. Bilateral fetal PCA. Delayed phase: No abnormal intracranial enhancement. Review of the MIP images confirms the above findings IMPRESSION: CT head: 1. No acute intracranial abnormality. 2. Stable chronic microvascular ischemic changes and volume loss of the brain. 3. 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 CTA neck: 1. No evidence of dissection, hemodynamically significant stenosis, or occlusion. 2. Non stenotic  atherosclerosis of aorta and carotid bifurcations. CTA head: 1. No large vessel occlusion, high-grade stenosis, or vascular malformation. 2. Right P2 mild-to-moderate stenosis. 3. Non stenotic calcific atherosclerosis of carotid siphons. Electronically Signed   By: Kristine Garbe M.D.   On: 06/14/2018 15:36   Ct  Angio Neck W And/or Wo Contrast  Result Date: 06/14/2018 CLINICAL DATA:  Dizziness and headache worsening over the last 3 days. EXAM: CT ANGIOGRAPHY HEAD AND NECK TECHNIQUE: Multidetector CT imaging of the head and neck was performed using the standard protocol during bolus administration of intravenous contrast. Multiplanar CT image reconstructions and MIPs were obtained to evaluate the vascular anatomy. Carotid stenosis measurements (when applicable) are obtained utilizing NASCET criteria, using the distal internal carotid diameter as the denominator. CONTRAST:  74mL ISOVUE-370 IOPAMIDOL (ISOVUE-370) INJECTION 76% COMPARISON:  06/18/2017 CT head. FINDINGS: CT HEAD FINDINGS Brain: No evidence of acute infarction, hemorrhage, hydrocephalus, extra-axial collection or mass lesion/mass effect. Stable chronic microvascular ischemic changes and volume loss of the brain. 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 Vascular: As below. Skull: Normal. Negative for fracture or focal lesion. Sinuses: Left maxillary antrostomy and ethmoidectomy chronic postsurgical changes. Moderate mucosal thickening of the left maxillary sinus. Normal aeration of mastoid air cells. Orbits: Bilateral intra-ocular lens replacement. Review of the MIP images confirms the above findings CTA NECK FINDINGS Aortic arch: Bovine variant branching. Imaged portion shows no evidence of aneurysm or dissection. No significant stenosis of the major arch vessel origins. Mild aortic calcific atherosclerosis. Right carotid system: No evidence of dissection, stenosis (50% or greater) or occlusion. Mild non stenotic calcific  atherosclerosis of carotid bifurcation. Left carotid system: No evidence of dissection, stenosis (50% or greater) or occlusion. Moderate non stenotic calcific atherosclerosis of the carotid bifurcation. Vertebral arteries: Codominant. No evidence of dissection, stenosis (50% or greater) or occlusion. Skeleton: C2-3 vertebral body and facet fusion on congenital basis. C4-5 grade 1 anterolisthesis. Moderate cervical spondylosis with prominent facet arthropathy. Other neck: Thyroid nodules measuring up to 13 mm in the right lobe. Upper chest: Right lung apex 3 mm perifissural nodule, likely intrapulmonary lymph node. Review of the MIP images confirms the above findings CTA HEAD FINDINGS Anterior circulation: No significant stenosis, proximal occlusion, aneurysm, or vascular malformation. Non stenotic calcific atherosclerosis of carotid siphons. Posterior circulation: No significant stenosis, proximal occlusion, aneurysm, or vascular malformation. Right P2 mild-to-moderate stenosis. Venous sinuses: As permitted by contrast timing, patent. Incidental subcentimeter right cavernous sinus lipoma. Anatomic variants: Complete circle-of-Willis. Bilateral fetal PCA. Delayed phase: No abnormal intracranial enhancement. Review of the MIP images confirms the above findings IMPRESSION: CT head: 1. No acute intracranial abnormality. 2. Stable chronic microvascular ischemic changes and volume loss of the brain. 3. 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 CTA neck: 1. No evidence of dissection, hemodynamically significant stenosis, or occlusion. 2. Non stenotic atherosclerosis of aorta and carotid bifurcations. CTA head: 1. No large vessel occlusion, high-grade stenosis, or vascular malformation. 2. Right P2 mild-to-moderate stenosis. 3. Non stenotic calcific atherosclerosis of carotid siphons. Electronically Signed   By: Kristine Garbe M.D.   On: 06/14/2018 15:36      _________________________ 4:25 PM on  06/14/2018 -----------------------------------------  CTA of the head and neck is showing no causes of patient's headache.  I reevaluated patient who tells me she has been having these headaches frequently for several years.  They are usually associated with the sinus infections and vertigo.  She is followed by ENT who treats her up for those symptoms.  Today her ENT did not have an appointment to see her so she ended up going to her PCP which told her she had to come to the emergency room.  Her headaches are mild to moderate, they are frontal and throbbing, they are usually associated with congestion and  rhinorrhea.  She denies any thunderclap headache, no neurological deficits.  She again reports that the headache she is having today is identical to the one she has had for several years.  Therefore I have ever low suspicion for subarachnoid hemorrhage and I do not believe the patient warrants a spinal tap at this time.  I will give her a prescription for meclizine.  The CT does show inflammation of the left maxillary sinus.  We will give her Afrin and recommend that she follows up with her ENT.  I discussed my standard return precautions with patient and her husband.        Rudene Re, MD 06/14/18 [phone]

## 2018-06-14 NOTE — ED Triage Notes (Signed)
Patient sent by PCP via [facility]. Complaining of dizziness and headache, worsening over the last 3 days. Patient reports history of spontaneoussubarachnoid hemmorhage approximately 40 years ago. Patient denies history of migraine or tension headaches. Stroke screen negative on arrival. MD at bedside.

## 2018-06-14 NOTE — ED Notes (Signed)
Patient ambulatory to restroom with standby assistance. Urine specimen obtained and sent to lab. Patient returned to bed and reattached to monitor. No further needs expressed at this time.

## 2018-06-14 NOTE — Telephone Encounter (Signed)
Pt came into office today with c/o dizziness, HA and blurred vision as of this am. Pt denies any numbness, tingling or pain in extremities or slurred speech. Pt also denies ShOB. Pt also denies any difficulty walking or facial pain; no drooling noted.  Pt was wanting to have her DIL come and pick her up. Spoke to PCP who states pt can only travel by family member if <67min away; pt denied. Per PCP, 911 called to transport pt to ED.

## 2018-06-14 NOTE — ED Notes (Signed)
Pt alert and oriented X4, active, cooperative, pt in NAD. RR even and unlabored, color WNL.  Pt informed to return if any life threatening symptoms occur.  Discharge and followup instructions reviewed. Ambulates safely. 

## 2018-06-14 NOTE — Discharge Instructions (Addendum)

## 2018-06-14 NOTE — ED Provider Notes (Signed)
[facility] Emergency Department Provider Note  ____________________________________________   First MD Initiated Contact with Patient 06/14/18 1323     (approximate)  I have reviewed the triage vital signs and the nursing notes.   HISTORY  Chief Complaint Dizziness and Headache   HPI Kristin [name] is a 77 y.o. female history of subarachnoid hemorrhage several years ago on aspirin, 81 mg daily, who was presented to the emergency department today with a 4 out of 10 frontal headache as well as dizziness over the past 4 days.  Says the headache has been slowly increasing over the past 4 days along with the dizziness.  Denies any weakness or numbness in her upper or lower extremities.  States that she is also had increased runny nose as well as pressure over the forehead and to her face, frontally.  Says that she feels that her vision has also been slightly blurred.  Denies pressure in her ears.  Says that she also has a history of rheumatoid arthritis but does not take any immunomodulators.  Headache was not sudden onset as she stated was the case with her subarachnoid hemorrhage.  She does not know the etiology of her subarachnoid hemorrhage.  States that she also has left lateral neck pain with range of motion of her neck.   Past Medical History:  Diagnosis Date  . Arthritis    recent falls -aug 2010  . [name] (coronary artery disease)    mi-1996  . Complication of anesthesia    pt states takes a long time to wake her wake up  . 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 Cough    no fever   . Degeneration of lumbar or lumbosacral intervertebral disc   . Dizziness    r/t meds  . Fibromyalgia   . History of shingles   . [name] (irritable bowel syndrome)    takes OTC [place] Negus colon health  . Joint pain   . Joint swelling   . Myalgia and myositis, unspecified   . Obstructive sleep  apnea (adult) (pediatric)   . Old myocardial infarction 1996  . Osteoarthritis   . Osteoarthrosis, unspecified whether generalized or localized, unspecified site   . Psoriatic arthritis ([place])   . Rheumatoid arthritis ([place])   . Rosacea   . Seasonal allergies   . Stroke [facility])    ischaemic microvascular disease  . 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 Unspecified essential hypertension    takes Diltiazem,Metoprolol,and Losartan daily  . UTI (lower urinary tract infection)    frequent=last one was winter of 2014    Patient Active Problem List   Diagnosis Date Noted  . Rheumatoid arthritis ([place]) 07/27/2017  . DVT, lower extremity, distal, acute, bilateral ([place]) 02/23/2017  . 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 Counseling regarding end of life decision making 01/14/2015  . Oral candidiasis 10/27/2014  . Short-term memory loss 10/17/2013  . Cerebral vascular disease 01/01/2013  . 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[place])   . PVC's (premature ventricular contractions) 06/03/2012  . Post herpetic neuralgia 10/11/2011  . Osteoarthritis 08/10/2011  . Chronic fatigue 08/10/2011  . MYOCARDIAL INFARCTION, HX OF 08/01/2010  . 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 Fibromyalgia 05/20/2008  . Hyperlipidemia 05/29/2007  . OBSTRUCTIVE SLEEP APNEA 05/29/2007  .  Essential hypertension, benign 05/29/2007  . ALLERGIC RHINITIS 05/29/2007  . ACNE, ROSACEA 05/29/2007    Past Surgical History:  Procedure Laterality Date  . ATHERECTOMY  1996  . [place]   vasospams; no blockage  . CARDIAC CATHETERIZATION  2013   No obstructive coronary artery disease. Mild distal LAD and proximal RCA disease.  [name] CATARACT EXTRACTION Bilateral   . COLONOSCOPY    . CT ABD WO/W & PELVIS WO CM  2013   no acute process, + minimal  diverticulosis and hepatic/renal cysts, ATH calcifications  . ETHMOIDECTOMY  1998  . 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 NASAL SINUS SURGERY  1998  . OOPHORECTOMY     BSO  . sinus biopsy  1998   inverted papiloma  . VAGINAL HYSTERECTOMY  1992   for mennorhagia-LAVH BSO    Prior to Admission medications   Medication Sig Start Date End Date Taking? Authorizing Provider  acetaminophen (TYLENOL) 325 MG tablet Take 325 mg by mouth every 8 (eight) hours as needed (pain).     [provider]  ASPIRIN 81 PO Take 1 tablet by mouth daily.    [provider]  Calcium Carbonate-Vitamin D (CALTRATE 600+D PO) Take 1 tablet by mouth 2 (two) times daily.     [provider]  Dextromethorphan Polistirex (DELSYM PO) Take by mouth as needed.    [provider]  diltiazem (CARDIZEM CD) 240 MG 24 hr capsule TAKE 1 CAPSULE BY MOUTH DAILY 06/13/18   Wellington Hampshire, MD  DOXYLAMINE SUCCINATE PO Take 25 mg by mouth at bedtime.    [provider]  FOLIC ACID PO Take 782 mg by mouth daily.     [provider]  Ibuprofen (MOTRIN PO) Take 1 tablet by mouth at bedtime.    [provider]  Multiple Vitamin (MULTIVITAMIN) tablet Take 1 tablet by mouth daily.      [provider]  nitroGLYCERIN (NITROSTAT) 0.4 MG SL tablet Place 1 tablet (0.4 mg total) every 5 (five) minutes as needed under the tongue for chest pain. 06/26/17   Wellington Hampshire, MD  Omega-3 Fatty Acids (FISH OIL) 1200 MG CAPS Take 1 capsule by mouth 2 (two) times daily.     [provider]  OVER THE COUNTER MEDICATION Take 3 tablets by mouth daily.    [provider]  Polyethyl Glycol-Propyl Glycol (SYSTANE) 0.4-0.3 % SOLN Apply 1 drop to eye daily as needed (dry eyes).     [provider]    Probiotic Product ([place]) CAPS Take 1 capsule by mouth daily.    [provider]  traMADol (ULTRAM) 50 MG tablet Take 1 tablet (50 mg total) by mouth every 6 (six) hours as needed. 11/06/17   Bedsole, Amy E, MD  triamcinolone cream (KENALOG) 0.1 %  11/22/17   [provider]  vitamin B-12 (CYANOCOBALAMIN) 1000 MCG tablet Take 1,000 mcg by mouth daily.    [provider]    Allergies Angiotensin receptor blockers; Hydralazine; Losartan; Methotrexate derivatives; Spironolactone; and Penicillins  Family History  Problem Relation Age of Onset  . Colon cancer Father   . Heart attack Mother   . Coronary artery disease Mother   . Osteoporosis Mother   . Heart disease Mother   . Heart failure Mother   .  GER disease Sister   . Hypertension Sister   . Osteopenia Sister   . GER disease Sister   . Breast cancer Paternal Aunt        age 71  . Heart failure Maternal Grandmother   . Heart failure Maternal Grandfather     Social History Social History   Tobacco Use  . Smoking status: Never Smoker  . Smokeless tobacco: Never Used  Substance Use Topics  . Alcohol use: No    Alcohol/week: 0.0 standard drinks    Comment: Rare  . Drug use: No    Review of Systems  Constitutional: No fever/chills Eyes: No visual changes. ENT: No sore throat. Cardiovascular: Denies chest pain. Respiratory: Denies shortness of breath. Gastrointestinal: No abdominal pain.  No nausea, no vomiting.  No diarrhea.  No constipation. Genitourinary: Negative for dysuria. Musculoskeletal: Negative for back pain. Skin: Negative for rash. Neurological: Negative for focal weakness or numbness.   ____________________________________________   PHYSICAL EXAM:  VITAL SIGNS: ED Triage Vitals  Enc Vitals Group     BP 06/14/18 1320 (!) 186/76     Pulse Rate 06/14/18 1320 72     Resp 06/14/18 1320 18     Temp 06/14/18 1324 98.1 F (36.7 C)     Temp Source 06/14/18  1324 Oral     SpO2 06/14/18 1318 100 %     Weight 06/14/18 1324 142 lb (64.4 kg)     Height 06/14/18 1324 5\' 7"  (1.702 m)     Head Circumference --      Peak Flow --      Pain Score 06/14/18 1321 8     Pain Loc --      Pain Edu? --      Excl. in [place]? --     Constitutional: Alert and oriented. Well appearing and in no acute distress. Eyes: Conjunctivae are normal.  Head: Atraumatic.  Normal TMs bilaterally. Mild tenderness palpation of the frontal as well as bilateral maxillary sinuses. Nose: No congestion/rhinnorhea. Mouth/Throat: Mucous membranes are moist.  Neck: No stridor.   Cardiovascular: Normal rate, regular rhythm. Grossly normal heart sounds.   Respiratory: Normal respiratory effort.  No retractions. Lungs CTAB. Gastrointestinal: Soft and nontender. No distention.  Musculoskeletal: No lower extremity tenderness nor edema.  No joint effusions. Neurologic:  Normal speech and language. No gross focal neurologic deficits are appreciated.  No nystagmus.  No ataxia on finger-to-nose nor heel-to-shin testing.   Skin:  Skin is warm, dry and intact. No rash noted. Psychiatric: Mood and affect are normal. Speech and behavior are normal.  ____________________________________________   LABS (all labs ordered are listed, but only abnormal results are displayed)  Labs Reviewed  BASIC METABOLIC PANEL - Abnormal; Notable for the following components:      Result Value   Potassium 3.3 (*)    Glucose, Bld 112 (*)    BUN 24 (*)    All other components within normal limits  URINALYSIS, COMPLETE (UACMP) WITH MICROSCOPIC - Abnormal; Notable for the following components:   Color, Urine STRAW (*)    APPearance CLEAR (*)    All other components within normal limits  CBC WITH DIFFERENTIAL/PLATELET  SEDIMENTATION RATE  TROPONIN I   ____________________________________________  EKG  ED ECG REPORT I, Doran Stabler, the attending physician, personally viewed and interpreted this  ECG.   Date: 06/14/2018  EKG Time: 1331  Rate: 66  Rhythm: normal sinus rhythm  Axis: Normal  Intervals:none  ST&T Change: No ST segment  elevation or depression.  Single T wave inversion in aVL. No significant change from previous. ____________________________________________  RADIOLOGY  Pending CT angiography. ____________________________________________   PROCEDURES  Procedure(s) performed:   Procedures  Critical Care performed:   ____________________________________________   INITIAL IMPRESSION / ASSESSMENT AND PLAN / ED COURSE  Pertinent labs & imaging results that were available during my care of the patient were reviewed by me and considered in my medical decision making (see chart for details).  Differential diagnosis includes, but is not limited to, intracranial hemorrhage, meningitis/encephalitis, previous head trauma, cavernous venous thrombosis, tension headache, temporal arteritis, migraine or migraine equivalent, idiopathic intracranial hypertension, and non-specific headache. As part of my medical decision making, I reviewed the following data within the electronic MEDICAL RECORD NUMBER Notes from prior ED visits  ----------------------------------------- 3:11 PM on 06/14/2018 -----------------------------------------  Patient pending CT angiography.  Given meclizine.  Will require reevaluation by Dr. Sullivan Lone.  Possible peripheral vertigo from sinus congestion with sinus pressure versus central process. ____________________________________________   FINAL CLINICAL IMPRESSION(S) / ED DIAGNOSES  Vertigo.  Headache.  NEW MEDICATIONS STARTED DURING THIS VISIT:  New Prescriptions   No medications on file     Note:  This document was prepared using Dragon voice recognition software and may include unintentional dictation errors.     Orbie Pyo, MD 06/14/18 [phone]

## 2018-06-25 DIAGNOSIS — R6889 Other general symptoms and signs: Secondary | ICD-10-CM | POA: Diagnosis not present

## 2018-06-25 DIAGNOSIS — J329 Chronic sinusitis, unspecified: Secondary | ICD-10-CM | POA: Diagnosis not present

## 2018-06-25 DIAGNOSIS — R42 Dizziness and giddiness: Secondary | ICD-10-CM | POA: Diagnosis not present

## 2018-06-25 DIAGNOSIS — J3489 Other specified disorders of nose and nasal sinuses: Secondary | ICD-10-CM | POA: Diagnosis not present

## 2018-06-28 ENCOUNTER — Telehealth: Payer: Self-pay | Admitting: Family Medicine

## 2018-06-28 DIAGNOSIS — R42 Dizziness and giddiness: Secondary | ICD-10-CM

## 2018-06-28 NOTE — Telephone Encounter (Signed)
Patient will be having vestibular therapy done at [facility] under the guidance of Dr. Durwin Glaze.  She will just be going for 1 or 2 visits to reset the crystals in her ear.  Should just be for the therapy.   Let me know if you have any further questions to clarify.  Thanks.

## 2018-06-28 NOTE — Telephone Encounter (Signed)
Please clarify. Does she need a referral for ENT or for vestibular therapy?

## 2018-06-28 NOTE — Telephone Encounter (Signed)
Pt called office stating she was diagnosed with Vertigo by [facility] Dr.Rosen. Pt is being referred to get vestibular therapy done, but prefers to go to [facility] Dr.Alexa Rose. Pt was told her PCP would have to put in order. Fax number is [phone] Please advise.

## 2018-07-01 NOTE — Addendum Note (Signed)
Addended by: Eliezer Lofts E on: 07/01/2018 12:06 PM   Modules accepted: Orders

## 2018-07-02 ENCOUNTER — Encounter: Payer: Self-pay | Admitting: Diagnostic Neuroimaging

## 2018-07-02 ENCOUNTER — Ambulatory Visit (INDEPENDENT_AMBULATORY_CARE_PROVIDER_SITE_OTHER): Payer: PPO | Admitting: Diagnostic Neuroimaging

## 2018-07-02 VITALS — BP 175/75 | HR 72 | Ht 67.0 in | Wt 146.8 lb

## 2018-07-02 DIAGNOSIS — R413 Other amnesia: Secondary | ICD-10-CM | POA: Diagnosis not present

## 2018-07-02 DIAGNOSIS — F0390 Unspecified dementia without behavioral disturbance: Secondary | ICD-10-CM

## 2018-07-02 DIAGNOSIS — F03A0 Unspecified dementia, mild, without behavioral disturbance, psychotic disturbance, mood disturbance, and anxiety: Secondary | ICD-10-CM

## 2018-07-02 NOTE — Progress Notes (Signed)
[facility]  PATIENT: Kristin [name] DOB: 19-Feb-1941  REFERRING CLINICIAN: Elie Goody HISTORY FROM: patient and husband  REASON FOR VISIT: new consult    HISTORICAL  CHIEF COMPLAINT:  Chief Complaint  Patient presents with  . New Patient (Initial Visit)    Rm 6, husband.  She is retired Marine scientist.    . Memory Loss, referred by Dr. Margot Chimes pcp 3 wks ago, for dizziness,  went to [facility]/ED had scans negative. referred to [facility], has not gone yet.  Sent to Dr. Constance Holster ENT - ok, Now referred to [place] for meoery confusion, forgetfulness    HISTORY OF PRESENT ILLNESS:   77 year old female here for evaluation of memory loss.  Patient reports 6 to 12 months of mild short-term memory loss and confusion.  She is not sure if this is very significant or not.  She turns to her husband repeatedly for some help to answer my questions.  According to patient's husband, patient has been having at least 2 to 3 years of short-term memory loss and confusion, misplacing objects, forgetting recent conversations, repeating herself, having more trouble with shopping and functioning independently outside of home.  She still is able to drive short distances to familiar places.  She is able to maintain all of her personal activities of daily living.  She is able to do some light housework at home.  Apparently her primary care was concerned about these memory problems as well and recommended to consider Aricept and also attend "memory classes" but this turned off the patient who subsequently "fired her PCP".  Patient is now getting established with a new primary care clinic.  Patient also has obstructive sleep apnea but is not able to tolerate CPAP.  She has hypertension which is suboptimally controlled.  She handles her own medications but her husband helps her with these.   REVIEW OF SYSTEMS: Full 14 system review of systems performed and negative with exception of: Snoring swelling in legs  fatigue easy bruising memory loss confusion sleepiness dizziness.  History of MI.  History of subarachnoid hemorrhage (non-aneurysmal).   ALLERGIES: Allergies  Allergen Reactions  . Angiotensin Receptor Blockers Anaphylaxis    Fatigue & Dizziness  . Hydralazine Other (See Comments)    Unknown  . Losartan     Dizziness   . Methotrexate Derivatives     Dizziness, rash   . Spironolactone     Dizziness   . Penicillins Hives and Rash    HOME MEDICATIONS: Outpatient Medications Prior to Visit  Medication Sig Dispense Refill  . acetaminophen (TYLENOL) 325 MG tablet Take 325 mg by mouth every 8 (eight) hours as needed (pain).     . ASPIRIN 81 PO Take 1 tablet by mouth daily.    . Calcium Carbonate-Vitamin D (CALTRATE 600+D PO) Take 1 tablet by mouth 2 (two) times daily.     [name] Dextromethorphan Polistirex (DELSYM PO) Take by mouth as needed.    . diltiazem (CARDIZEM CD) 240 MG 24 hr capsule TAKE 1 CAPSULE BY MOUTH DAILY (Patient taking differently: Take 240 mg by mouth daily. ) 30 capsule 2  . DOXYLAMINE SUCCINATE PO Take 25 mg by mouth at bedtime.    [name] FOLIC ACID PO Take 093 mg by mouth daily.     . Ibuprofen (MOTRIN PO) Take 1 tablet by mouth at bedtime.    . Multiple Vitamin (MULTIVITAMIN) tablet Take 1 tablet by mouth daily.      . nitroGLYCERIN (  NITROSTAT) 0.4 MG SL tablet Place 1 tablet (0.4 mg total) every 5 (five) minutes as needed under the tongue for chest pain. 30 tablet 3  . Omega-3 Fatty Acids (FISH OIL) 1200 MG CAPS Take 1 capsule by mouth 2 (two) times daily.     [name] OVER THE COUNTER MEDICATION Take 3 tablets by mouth daily.    Vladimir Faster Glycol-Propyl Glycol (SYSTANE) 0.4-0.3 % SOLN Apply 1 drop to eye daily as needed (dry eyes).     . Probiotic Product ([place]) CAPS Take 1 capsule by mouth daily.    . traMADol (ULTRAM) 50 MG tablet Take 1 tablet (50 mg total) by mouth every 6 (six) hours as needed. 90 tablet 1  . triamcinolone cream (KENALOG) 0.1 %     .  vitamin B-12 (CYANOCOBALAMIN) 1000 MCG tablet Take 1,000 mcg by mouth daily.    . meclizine (ANTIVERT) 25 MG tablet Take 1 tablet (25 mg total) by mouth 3 (three) times daily as needed for dizziness. (Patient not taking: Reported on 07/02/2018) 30 tablet 0   No facility-administered medications prior to visit.     PAST MEDICAL HISTORY: Past Medical History:  Diagnosis Date  . Arthritis    recent falls -aug 2010  . [name] (coronary artery disease)    mi-1996  . Complication of anesthesia    pt states takes a long time to wake her wake up  . 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 ([place]) 08/27/2015  . Cough    no fever   . Degeneration of lumbar or lumbosacral intervertebral disc   . Dizziness    r/t meds  . Fibromyalgia   . History of shingles   . [name] (irritable bowel syndrome)    takes OTC [place] Negus colon health  . Joint pain   . Joint swelling   . Myalgia and myositis, unspecified   . Obstructive sleep apnea (adult) (pediatric)   . Old myocardial infarction 1996  . Osteoarthritis   . Osteoarthrosis, unspecified whether generalized or localized, unspecified site   . Psoriatic arthritis ([place])   . Rheumatoid arthritis ([place])   . Rosacea   . Seasonal allergies   . Stroke [facility])    ischaemic microvascular disease  . 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 Unspecified essential hypertension    takes Diltiazem,Metoprolol,and Losartan daily  . UTI (lower urinary tract infection)    frequent=last one was winter of 2014    PAST SURGICAL HISTORY: Past Surgical History:  Procedure Laterality Date  . ATHERECTOMY  1996  . [place]   vasospams; no blockage  . CARDIAC CATHETERIZATION  2013   No obstructive coronary artery disease. Mild distal LAD and proximal RCA disease.  [name] CATARACT EXTRACTION Bilateral   . COLONOSCOPY    . CT ABD WO/W & PELVIS WO CM   2013   no acute process, + minimal diverticulosis and hepatic/renal cysts, ATH calcifications  . ETHMOIDECTOMY  1998  . 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 NASAL SINUS SURGERY  1998  . OOPHORECTOMY     BSO  . sinus biopsy  1998   inverted papiloma  . VAGINAL HYSTERECTOMY  1992   for mennorhagia-LAVH BSO    FAMILY  HISTORY: Family History  Problem Relation Age of Onset  . Colon cancer Father   . Heart attack Mother   . Coronary artery disease Mother   . Osteoporosis Mother   . Heart disease Mother   . Heart failure Mother   . GER disease Sister   . Hypertension Sister   . Osteopenia Sister   . GER disease Sister   . Breast cancer Paternal Aunt        age 33  . Heart failure Maternal Grandmother   . Heart failure Maternal Grandfather     SOCIAL HISTORY: Social History   Socioeconomic History  . Marital status: Married    Spouse name: Not on file  . Number of children: 2  . Years of education: BSN  . Highest education level: Not on file  Occupational History  . 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 Financial resource strain: Not on file  . Food insecurity:    Worry: Not on file    Inability: Not on file  . Transportation needs:    Medical: Not on file    Non-medical: Not on file  Tobacco Use  . Smoking status: Never Smoker  . Smokeless tobacco: Never Used  Substance and Sexual Activity  . Alcohol use: No    Alcohol/week: 0.0 standard drinks    Comment: Rare  . Drug use: No  . Sexual activity: Not Currently    Birth control/protection: Surgical    Comment: 1st intercourse 41 yo-2 partners  Lifestyle  . Physical activity:    Days per week: Not on file    Minutes per session: Not on file  . Stress: Not on file    Relationships  . Social connections:    Talks on phone: Not on file    Gets together: Not on file    Attends religious service: Not on file    Active member of club or organization: Not on file    Attends meetings of clubs or organizations: Not on file    Relationship status: Not on file  . Intimate partner violence:    Fear of current or ex partner: Not on file    Emotionally abused: Not on file    Physically abused: Not on file    Forced sexual activity: Not on file  Other Topics Concern  . Not on file  Social History Narrative   No regular exercise-used to do [place] will.. She is DNR, [name] is husband [name] , then son [name]       10/22/12-cardiac work up found the reason for her dizziness,  she needs no neurological follow up for that.  her enthusiasm for CPAP remains limited and is explained by her resiudal high AHI after she finally reached compliance ,[place] 13 points.     95% pressure is 11.7 cm , AHI  20 .7- she does not benefit from CPAP . She is allowed to d/c the machine and we need to concerntrate on her dizziness and facial right droop. ordered MRI brain repeat.    Eye exam next week. PT at [facility]. MRI brain in April- per patients request order next month.            PHYSICAL EXAM  GENERAL EXAM/CONSTITUTIONAL: Vitals:  Vitals:   07/02/18 0901  BP: (!) 175/75  Pulse: 72  Weight: 146 lb 12.8 oz (66.6 kg)  Height: 5\' 7"  (1.702 m)     Body mass index is 22.99 kg/m. Wt Readings from Last 3 Encounters:  07/02/18 146 lb 12.8 oz (66.6 kg)  06/14/18 142 lb (64.4 kg)  03/01/18 142 lb 8 oz (64.6 kg)     Patient is in no distress; well developed, nourished and groomed; neck is supple  CARDIOVASCULAR:  Examination of carotid arteries is normal; no carotid bruits  Regular rate and rhythm, no murmurs  Examination of peripheral vascular system by observation and palpation is normal  EYES:  Ophthalmoscopic exam  of optic discs and posterior segments is normal; no papilledema or hemorrhages  Visual Acuity Screening   Right eye Left eye Both eyes  Without correction: 20/50 20/40   With correction:        MUSCULOSKELETAL:  Gait, strength, tone, movements noted in Neurologic exam below  NEUROLOGIC: MENTAL STATUS:  MMSE - Mini Mental State Exam 07/02/2018 02/21/2018 02/19/2017  Orientation to time 2 5 3   Orientation to time comments - - Does not know date or day of the week.  Orientation to Place 5 5 5   Registration 3 3 3   Attention/ Calculation 0 0 0  Recall 0 0 0  Recall-comments - unable to recall 3 of 3 words -  Language- name 2 objects 2 0 0  Language- repeat 1 1 1   Language- follow 3 step command 3 3 3   Language- read & follow direction 1 0 0  Write a sentence 1 0 0  Copy design 1 0 0  Total score 19 17 15     awake, alert, oriented to person  [place] attention and concentration  language fluent, comprehension intact, naming intact  fund of knowledge appropriate  CRANIAL NERVE:   2nd - no papilledema on fundoscopic exam  2nd, 3rd, 4th, 6th - pupils equal and reactive to light, visual fields full to confrontation, extraocular muscles intact, no nystagmus  5th - facial sensation symmetric  7th - facial strength symmetric  8th - hearing intact  9th - palate elevates symmetrically, uvula midline  11th - shoulder shrug symmetric  12th - tongue protrusion midline  MOTOR:   normal bulk and tone, full strength in the BUE, BLE; EXCEPT DELTOID 3 (LIMITED BY PAIN) AND HIP FLEXION 3; KNEE EXT/FLEX 4; RIGHT DF 4; LEFT DF 5  SENSORY:   normal and symmetric to light touch, temperature, vibration  COORDINATION:   finger-nose-finger, fine finger movements normal  REFLEXES:   deep tendon reflexes --> BUE 1; TRICEPS 2; KNEES TRACE; ANKLES TRACE  NEG MYERSON; NEG PALMOMENTAL  GAIT/STATION:   narrow based gait; SLOW AND CAUTIOUS     DIAGNOSTIC DATA (LABS,  IMAGING, TESTING) - I reviewed patient records, labs, notes, testing and imaging myself where available.  Lab Results  Component Value Date   WBC 8.9 06/14/2018   HGB 13.4 06/14/2018   HCT 40.2 06/14/2018   MCV 95.5 06/14/2018   PLT 209 06/14/2018      Component Value Date/Time   NA 140 06/14/2018 1320   NA 136 12/29/2016 1036   K 3.3 (L) 06/14/2018 1320   CL 105 06/14/2018 1320   CO2 25 06/14/2018 1320   GLUCOSE 112 (H) 06/14/2018 1320   BUN 24 (H) 06/14/2018 1320   BUN 58 (H) 12/29/2016 1036   CREATININE 0.88 06/14/2018 1320   CALCIUM 9.7 06/14/2018 1320   PROT 7.3 02/21/2018 1035  ALBUMIN 3.9 02/21/2018 1035   AST 19 02/21/2018 1035   ALT 15 02/21/2018 1035   ALKPHOS 67 02/21/2018 1035   BILITOT 0.4 02/21/2018 1035   GFRNONAA >60 06/14/2018 1320   GFRAA >60 06/14/2018 1320   Lab Results  Component Value Date   CHOL 229 (H) 02/21/2018   HDL 73.20 02/21/2018   LDLCALC 134 (H) 02/21/2018   LDLDIRECT 158.1 10/02/2013   TRIG 106.0 02/21/2018   CHOLHDL 3 02/21/2018   No results found for: HGBA1C Lab Results  Component Value Date   [id number] 07/27/2017   Lab Results  Component Value Date   TSH 2.44 02/19/2017    06/14/18 CTA neck:[I reviewed images myself and agree with interpretation. -VRP]  1. No evidence of dissection, hemodynamically significant stenosis, or occlusion.  2. Non stenotic atherosclerosis of aorta and carotid bifurcations.  06/14/18 CTA head:[I reviewed images myself and agree with interpretation. -VRP]  1. No large vessel occlusion, high-grade stenosis, or vascular malformation. 2. Right P2 mild-to-moderate stenosis. 3. Non stenotic calcific atherosclerosis of carotid siphons.    ASSESSMENT AND PLAN  77 y.o. year old female here with gradual onset progressive short-term memory loss, confusion, repeating herself, misplacing objects, slight change in activities of daily living.  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 Memory loss     PLAN:  MEMORY LOSS (mild dementia) - consider memantine 10mg  at bedtime; increase to twice a day after 1-2 weeks - safety / supervision issues reviewed - caregiver resources provided - caution with driving and finances  Return pending symptoms progression.    Penni Bombard, MD 81/77/1165, 7:90 AM Certified in Neurology, Neurophysiology and Neuroimaging  [facility] [address] [place], [place] [address] [phone]

## 2018-07-02 NOTE — Patient Instructions (Signed)
MEMORY LOSS (mild dementia) - consider memantine 10mg  at bedtime; increase to twice a day after 1-2 weeks - safety / supervision issues reviewed - caregiver resources provided - caution with driving and finances

## 2018-07-02 NOTE — Progress Notes (Signed)
Call  Does she plan on continuing as a patient here or has she decided to find a new primary care MD? See HPI of neuro note. If she knows who she is changing to .[name] Please change MD or record, forward her neuro note to them and cancel her wellness appointments.

## 2018-07-03 ENCOUNTER — Encounter: Payer: Self-pay | Admitting: *Deleted

## 2018-07-04 NOTE — Progress Notes (Signed)
Spoke with [name].  She is scheduled to establish care with Cassell Smiles, NP in [place].  She states it is just closer to where she lives.  Appointments cancelled.  PCP changed.  [name] is part on [place] so she should be able to review notes in Epic.

## 2018-07-05 ENCOUNTER — Telehealth: Payer: Self-pay | Admitting: Family Medicine

## 2018-07-05 NOTE — Telephone Encounter (Signed)
Called the patient and she is establishing with a New Provider because she lives in [place] and this New provider is very close to her home. She would like her New provider to refer her for the Vestibular Therapy instead of Dr Diona Browner. Please cancel the order you placed

## 2018-07-16 ENCOUNTER — Encounter: Payer: Self-pay | Admitting: Nurse Practitioner

## 2018-07-16 ENCOUNTER — Other Ambulatory Visit: Payer: Self-pay

## 2018-07-16 ENCOUNTER — Ambulatory Visit (INDEPENDENT_AMBULATORY_CARE_PROVIDER_SITE_OTHER): Payer: PPO | Admitting: Nurse Practitioner

## 2018-07-16 VITALS — BP 145/47 | HR 67 | Temp 98.1°F | Ht 65.0 in | Wt 144.6 lb

## 2018-07-16 DIAGNOSIS — R42 Dizziness and giddiness: Secondary | ICD-10-CM | POA: Diagnosis not present

## 2018-07-16 DIAGNOSIS — Z7689 Persons encountering health services in other specified circumstances: Secondary | ICD-10-CM

## 2018-07-16 DIAGNOSIS — M150 Primary generalized (osteo)arthritis: Secondary | ICD-10-CM

## 2018-07-16 DIAGNOSIS — M159 Polyosteoarthritis, unspecified: Secondary | ICD-10-CM

## 2018-07-16 DIAGNOSIS — M8949 Other hypertrophic osteoarthropathy, multiple sites: Secondary | ICD-10-CM

## 2018-07-16 DIAGNOSIS — M0579 Rheumatoid arthritis with rheumatoid factor of multiple sites without organ or systems involvement: Secondary | ICD-10-CM | POA: Diagnosis not present

## 2018-07-16 MED ORDER — TRAMADOL HCL 50 MG PO TABS: 50.0000 mg | ORAL_TABLET | Freq: Two times a day (BID) | ORAL | 2 refills | Status: DC | PRN

## 2018-07-16 NOTE — Progress Notes (Signed)
Subjective:    Patient ID: Kristin [name], female    DOB: 09-07-1940, 77 y.o.   MRN: [id number]  Kristin [name] is a 77 y.o. female presenting on 07/16/2018 for Establish Care (intermittent dizziness ) and Dizziness (pt states she feel really dizzy today and almost past out in the bedroom. She contributes it with getting up to fast. )   HPI Establish Care New Provider Pt last seen by PCP Dr. Diona Browner about 2 months ago.  Obtain records from [facility].  Dizziness Patient has chronic dizziness with recent evaluation at ED with rule out CVA or intracranial abnormality. Patient has also seen neurology and ENT recently.  ENT states there is no sinus and unlikely vestibular cause of dizziness symptoms.  Patient had been referred to vestibular therapy by Dr. Diona Browner, but this referral was cancelled with transfer of care.  Records reviewed in [facility] for this history as patient and husband were poor historians. - TO date, meclizine is not helpful.  They desire something to help with symptoms today.  Patient continues to have BP management by diltiazem, which patient has taken for several years.  She has significant listed allergies for antihypertensives 2/2 dizziness. - No syncopal events/falls to date.   Chronic pain - Tramadol taken about 3-4 doses per week.  Continues for arthritis and joint aches/pains.  Is effective prn medication.  Requests refill for future fills.  Past Medical History:  Diagnosis Date  . Allergy   . Arthritis    recent falls -aug 2010  . [name] (coronary artery disease)    [place]-1996  . Complication of anesthesia    pt states takes a long time to wake her wake up  . 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 Cough    no fever   . Degeneration of lumbar or lumbosacral intervertebral disc   . Dizziness    r/t meds  . Fibromyalgia   . History of shingles   . [name] (irritable bowel syndrome)    takes OTC  Hardin Negus colon health  . Joint pain   . Joint swelling   . Myalgia and myositis, unspecified   . Obstructive sleep apnea (adult) (pediatric)   . Old myocardial infarction 1996  . Osteoarthritis   . Osteoarthrosis, unspecified whether generalized or localized, unspecified site   . Psoriatic arthritis ([place])   . Rheumatoid arthritis ([place])   . Rosacea   . Seasonal allergies   . Stroke [facility])    ischaemic microvascular disease  . 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 Unspecified essential hypertension    takes Diltiazem,Metoprolol,and Losartan daily  . UTI (lower urinary tract infection)    frequent=last one was winter of 2014   Past Surgical History:  Procedure Laterality Date  . ATHERECTOMY  1996  . [place]   vasospams; no blockage  . CARDIAC CATHETERIZATION  2013   No obstructive coronary artery disease. Mild distal LAD and proximal RCA disease.  [name] CATARACT EXTRACTION Bilateral   . COLONOSCOPY    . CT ABD WO/W & PELVIS WO CM  2013   no acute process, + minimal diverticulosis and hepatic/renal cysts, ATH calcifications  . ETHMOIDECTOMY  1998  . 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 NASAL SINUS SURGERY  1998  . OOPHORECTOMY     BSO  . sinus biopsy  1998   inverted papiloma  . VAGINAL HYSTERECTOMY  1992   for mennorhagia-LAVH BSO   Social History   Socioeconomic History  . Marital status: Married    Spouse name: Not on file  . Number of children: 2  . Years of education: BSN  . Highest education level: Not on file  Occupational History  . 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 Financial resource strain: Not on file  . Food insecurity:    Worry: Not on file     Inability: Not on file  . Transportation needs:    Medical: Not on file    Non-medical: Not on file  Tobacco Use  . Smoking status: Never Smoker  . Smokeless tobacco: Never Used  Substance and Sexual Activity  . Alcohol use: No    Alcohol/week: 0.0 standard drinks    Comment: Rare  . Drug use: No  . Sexual activity: Not Currently    Birth control/protection: Surgical    Comment: 1st intercourse 56 yo-2 partners  Lifestyle  . Physical activity:    Days per week: Not on file    Minutes per session: Not on file  . Stress: Not on file  Relationships  . Social connections:    Talks on phone: Not on file    Gets together: Not on file    Attends religious service: Not on file    Active member of club or organization: Not on file    Attends meetings of clubs or organizations: Not on file    Relationship status: Not on file  . Intimate partner violence:    Fear of current or ex partner: Not on file    Emotionally abused: Not on file    Physically abused: Not on file    Forced sexual activity: Not on file  Other Topics Concern  . Not on file  Social History Narrative   No regular exercise-used to do [place] will.. She is DNR, [name] is husband [name] , then son [name]       10/22/12-cardiac work up found the reason for her dizziness,  she needs no neurological follow up for that.  her enthusiasm for CPAP remains limited and is explained by her resiudal high AHI after she finally reached compliance ,[place] 13 points.     95% pressure is 11.7 cm , AHI  20 .7- she does not benefit from CPAP . She is allowed to d/c the machine and we need to concerntrate on her dizziness and facial right droop. ordered MRI brain repeat.    Eye exam next week. PT at [facility]. MRI brain in April- per patients request order next month.          Family History  Problem Relation Age of Onset  . Colon cancer Father   . Heart attack Mother   . Coronary artery  disease Mother   . Osteoporosis Mother   . Heart disease Mother   . Heart failure Mother   . GER disease Sister   . Hypertension Sister   . Osteopenia Sister   . GER disease Sister   . Breast cancer Paternal Aunt  age 78  . Heart failure Maternal Grandmother   . Heart failure Maternal Grandfather    Current Outpatient Medications on File Prior to Visit  Medication Sig  . acetaminophen (TYLENOL) 325 MG tablet Take 325 mg by mouth every 8 (eight) hours as needed (pain).   . ASPIRIN 81 PO Take 1 tablet by mouth daily.  . Calcium Carbonate-Vitamin D (CALTRATE 600+D PO) Take 1 tablet by mouth 2 (two) times daily.   . Coenzyme Q10 (COQ-10) 200 MG CAPS Take by mouth.  . diltiazem (CARDIZEM CD) 240 MG 24 hr capsule TAKE 1 CAPSULE BY MOUTH DAILY (Patient taking differently: Take 240 mg by mouth daily. )  . ferrous sulfate 325 (65 FE) MG EC tablet Take 325 mg by mouth 3 (three) times daily with meals.  [name] FOLIC ACID PO Take 195 mg by mouth daily.   . Ibuprofen (MOTRIN PO) Take 1 tablet by mouth at bedtime.  . Multiple Vitamin (MULTIVITAMIN) tablet Take 1 tablet by mouth daily.    . NON FORMULARY 3,000 mg. CBD Tincture  . Omega-3 Fatty Acids (FISH OIL) 1200 MG CAPS Take 1 capsule by mouth 2 (two) times daily.   [name] OVER THE COUNTER MEDICATION Take 3 tablets by mouth daily.  Vladimir Faster Glycol-Propyl Glycol (SYSTANE) 0.4-0.3 % SOLN Apply 1 drop to eye daily as needed (dry eyes).   . Probiotic Product ([place]) CAPS Take 1 capsule by mouth daily.  . traMADol (ULTRAM) 50 MG tablet Take 1 tablet (50 mg total) by mouth every 6 (six) hours as needed.  . triamcinolone cream (KENALOG) 0.1 %   . Turmeric 500 MG CAPS Take by mouth.  . vitamin B-12 (CYANOCOBALAMIN) 1000 MCG tablet Take 1,000 mcg by mouth daily.  [name] Dextromethorphan Polistirex (DELSYM PO) Take by mouth as needed.  [name] DOXYLAMINE SUCCINATE PO Take 25 mg by mouth at bedtime.  . nitroGLYCERIN (NITROSTAT) 0.4 MG SL tablet Place  1 tablet (0.4 mg total) every 5 (five) minutes as needed under the tongue for chest pain. (Patient not taking: Reported on 07/16/2018)   No current facility-administered medications on file prior to visit.     Review of Systems  Constitutional: Negative for chills and fever.  HENT: Negative for congestion and sore throat.   Eyes: Negative for pain.  Respiratory: Negative for cough, shortness of breath and wheezing.   Cardiovascular: Negative for chest pain, palpitations and leg swelling.  Gastrointestinal: Negative for abdominal pain, blood in stool, constipation, diarrhea, nausea and vomiting.  Endocrine: Negative for polydipsia.  Genitourinary: Negative for dysuria, frequency, hematuria and urgency.  Musculoskeletal: Negative for back pain, myalgias and neck pain.  Skin: Negative.  Negative for rash.  Allergic/Immunologic: Negative for environmental allergies.  Neurological: Positive for dizziness. Negative for weakness and headaches.       Memory impairment  Hematological: Does not bruise/bleed easily.  Psychiatric/Behavioral: Negative for dysphoric mood and suicidal ideas. The patient is not nervous/anxious.    Per HPI unless specifically indicated above     Objective:    BP (!) 145/47 (BP Location: Right Arm, Patient Position: Sitting, Cuff Size: Normal)   Pulse 67   Temp 98.1 F (36.7 C) (Oral)   Ht 5\' 5"  (1.651 m)   Wt 144 lb 9.6 oz (65.6 kg)   LMP 08/14/1989   BMI 24.06 kg/m   Wt Readings from Last 3 Encounters:  07/16/18 144 lb 9.6 oz (65.6 kg)  07/02/18 146 lb 12.8 oz (66.6 kg)  06/14/18 142 lb (  64.4 kg)   Orthostatics: No data found.  Physical Exam  Constitutional: She is oriented to person, place, and time. She appears well-developed and well-nourished. No distress.  HENT:  Head: Normocephalic and atraumatic.  Neck: Normal range of motion. Neck supple. Carotid bruit is not present.  Cardiovascular: Normal rate, regular rhythm, S1 normal, S2 normal, normal  heart sounds and intact distal pulses.  Pulmonary/Chest: Effort normal and breath sounds normal. No respiratory distress.  Musculoskeletal: She exhibits no edema (pedal).  Neurological: She is alert and oriented to person, place, and time. No cranial nerve deficit. GCS eye subscore is 4. GCS verbal subscore is 5. GCS motor subscore is 6.  Positive romberg. Positive dizziness with position changes.   Negative forward head tilt and Dix-Hallpike tests.  Skin: Skin is warm and dry. Capillary refill takes less than 2 seconds.  Psychiatric: She has a normal mood and affect. Her speech is normal and behavior is normal. Judgment and thought content normal. Cognition and memory are impaired. She exhibits abnormal recent memory.  Vitals reviewed.    Results for orders placed or performed during the hospital encounter of 06/14/18  CBC with Differential  Result Value Ref Range   WBC 8.9 4.0 - 10.5 K/uL   RBC 4.21 3.87 - 5.11 MIL/uL   Hemoglobin 13.4 12.0 - 15.0 g/dL   HCT 40.2 36.0 - 46.0 %   MCV 95.5 80.0 - 100.0 fL   MCH 31.8 26.0 - 34.0 pg   MCHC 33.3 30.0 - 36.0 g/dL   RDW 13.7 11.5 - 15.5 %   Platelets 209 150 - 400 K/uL   nRBC 0.0 0.0 - 0.2 %   Neutrophils Relative % 69 %   Neutro Abs 6.2 1.7 - 7.7 K/uL   Lymphocytes Relative 20 %   Lymphs Abs 1.8 0.7 - 4.0 K/uL   Monocytes Relative 8 %   Monocytes Absolute 0.7 0.1 - 1.0 K/uL   Eosinophils Relative 1 %   Eosinophils Absolute 0.1 0.0 - 0.5 K/uL   Basophils Relative 1 %   Basophils Absolute 0.1 0.0 - 0.1 K/uL   Immature Granulocytes 1 %   Abs Immature Granulocytes 0.04 0.00 - 0.07 K/uL  Sedimentation rate  Result Value Ref Range   Sed Rate 4 0 - 30 mm/hr  Basic metabolic panel  Result Value Ref Range   Sodium 140 135 - 145 mmol/L   Potassium 3.3 (L) 3.5 - 5.1 mmol/L   Chloride 105 98 - 111 mmol/L   CO2 25 22 - 32 mmol/L   Glucose, Bld 112 (H) 70 - 99 mg/dL   BUN 24 (H) 8 - 23 mg/dL   Creatinine, Ser 0.88 0.44 - 1.00 mg/dL    Calcium 9.7 8.9 - 10.3 mg/dL   GFR calc non Af Amer >60 >60 mL/min   GFR calc Af Amer >60 >60 mL/min   Anion gap 10 5 - 15  Troponin I  Result Value Ref Range   Troponin I <0.03 <0.03 ng/mL  Urinalysis, Complete w Microscopic  Result Value Ref Range   Color, Urine STRAW (A) YELLOW   APPearance CLEAR (A) CLEAR   Specific Gravity, Urine 1.006 1.005 - 1.030   pH 6.0 5.0 - 8.0   Glucose, UA NEGATIVE NEGATIVE mg/dL   Hgb urine dipstick NEGATIVE NEGATIVE   Bilirubin Urine NEGATIVE NEGATIVE   Ketones, ur NEGATIVE NEGATIVE mg/dL   Protein, ur NEGATIVE NEGATIVE mg/dL   Nitrite NEGATIVE NEGATIVE   Leukocytes, UA NEGATIVE NEGATIVE  RBC / HPF 0-5 0 - 5 RBC/hpf   WBC, UA 0-5 0 - 5 WBC/hpf   Bacteria, UA NONE SEEN NONE SEEN   Squamous Epithelial / LPF 0-5 0 - 5      Assessment & Plan:   Problem List Items Addressed This Visit      Musculoskeletal and Integument   Osteoarthritis - Primary   Relevant Medications   traMADol (ULTRAM) 50 MG tablet   Rheumatoid arthritis (HCC)   Relevant Medications   traMADol (ULTRAM) 50 MG tablet    Other Visit Diagnoses    Encounter to establish care       Dizziness       Relevant Orders   PT vestibular rehab      # Establish care Previous PCP was at Dr. Diona Browner.  Records are reviewed in [facility].  Past medical, family, and surgical history reviewed w/ patient in clinic today. - Reviewed meds and made recommendations for supplements to stop/reduce/change per patient request.  She started many from her own reading.  # Chronic pain  Pain is 2/2 arthritis (osteo and RA) - well managed current with Tramadol.   - Refill requested and provided. Patient does not take daily and still has fill from 6 months ago. - Follow-up every 6 months  # Dizziness Acute on chronic dizziness.  Patient is likely having orthostatic cause of hypotension with symptoms only occurring with position changes. Captured mild orthostatic hypotension with standing BP - CMA performed  these standing, sitting, lying despite patient being in lying position upon entering room.  Patient not agreeable to repeat these from correct order.  These were also not documented in flowsheets, so reference to these is not possible.  Plan: 1. Continue neurology follow-up.   - Can still have benefit from vestibular therapy.  Referral resubmitted. 2. Continue regular cardiology follow-up.  Due for follow-up in December per last records with Dr. Fletcher Anon. - Should consider changing diltiazem - perhaps to amlodipine.  Patient states she tolerated this well in past, but has not been on it for many years and does not remember why she was changed from this medication. 3. Follow-up 6 months.  Meds ordered this encounter  Medications  . traMADol (ULTRAM) 50 MG tablet    Sig: Take 1 tablet (50 mg total) by mouth every 12 (twelve) hours as needed for moderate pain.    Dispense:  60 tablet    Refill:  2    Order Specific Question:   Supervising Provider    Answer:   Olin Hauser [[id number]]   Follow up plan: Return in about 6 months (around 01/15/2019) for chronic pain, dizziness and sooner if needed for any changes.  Cassell Smiles, DNP, AGPCNP-BC Adult Gerontology Primary Care Nurse Practitioner [facility] 07/16/2018, 9:56 AM

## 2018-07-16 NOTE — Patient Instructions (Addendum)
Kristin [name],   Thank you for coming in to clinic today.  1. You may stop your Co-Q10 2. You may change iron dose to every other day or every M,W,F  3. B12 and folate can be taken as a single B complex vitamin  4. Keep your next appointment with Dr. Fletcher Anon.  Discuss your dizziness and see if there is any need to change your diltiazem.  5. You will get a phone call from physical therapy for vestibular therapy regarding your dizziness.   Please schedule a follow-up appointment with Cassell Smiles, AGNP. Return in about 6 months (around 01/15/2019) for chronic pain, dizziness and sooner if needed for any changes.  If you have any other questions or concerns, please feel free to call the clinic or send a message through [place]. You may also schedule an earlier appointment if necessary.  You will receive a survey after today's visit either digitally by e-mail or paper by [employer]. Your experiences and feedback matter to [place].  Please respond so we know how we are doing as we provide care for you.   Cassell Smiles, DNP, AGNP-BC Adult Gerontology Nurse Practitioner [place]

## 2018-07-17 ENCOUNTER — Encounter: Payer: Self-pay | Admitting: Nurse Practitioner

## 2018-07-24 NOTE — Progress Notes (Signed)
No data found.  Orthostatic Vitals signs were documented. 158/66 -  HR 62  standing - first collected and was collected from lying position to standing position. 165/65 - HR 68 sitting  - Collected 2nd 166/61- HR 75 lying  - Collected 3rd

## 2018-07-31 ENCOUNTER — Ambulatory Visit (INDEPENDENT_AMBULATORY_CARE_PROVIDER_SITE_OTHER): Payer: PPO | Admitting: Nurse Practitioner

## 2018-07-31 ENCOUNTER — Encounter: Payer: Self-pay | Admitting: Nurse Practitioner

## 2018-07-31 ENCOUNTER — Other Ambulatory Visit: Payer: Self-pay

## 2018-07-31 VITALS — BP 150/64 | HR 72 | Temp 98.1°F | Ht 65.0 in | Wt 146.4 lb

## 2018-07-31 DIAGNOSIS — R300 Dysuria: Secondary | ICD-10-CM | POA: Diagnosis not present

## 2018-07-31 DIAGNOSIS — N3001 Acute cystitis with hematuria: Secondary | ICD-10-CM

## 2018-07-31 LAB — POCT URINALYSIS DIPSTICK
Bilirubin, UA: NEGATIVE
Glucose, UA: NEGATIVE
Ketones, UA: NEGATIVE
Nitrite, UA: NEGATIVE
Protein, UA: NEGATIVE
Spec Grav, UA: >=1.03 — AB (ref 1.010–1.025)
Urobilinogen, UA: 0.2 E.U./dL
pH, UA: 5 (ref 5.0–8.0)

## 2018-07-31 MED ORDER — CEPHALEXIN 500 MG PO CAPS: 500.0000 mg | ORAL_CAPSULE | Freq: Three times a day (TID) | ORAL | 0 refills | Status: AC

## 2018-07-31 NOTE — Patient Instructions (Addendum)
Kristin [name],   Thank you for coming in to clinic today.  1. You have a urinary tract infection. - START Keflex 500mg  3 times daily for next 5 days.   - Drink plenty of fluids and improve hydration over next 1 week. - Provided precautions for severe symptoms requiring ED visit to include no urine in 24-48 hours.   2. Repeat lab only for urine in 2 weeks.  Please schedule a follow-up appointment with Cassell Smiles, AGNP. Return in 5-7 days if symptoms worsen or fail to improve AND labs only for urinalysis in 2 weeks.  If you have any other questions or concerns, please feel free to call the clinic or send a message through [place]. You may also schedule an earlier appointment if necessary.  You will receive a survey after today's visit either digitally by e-mail or paper by [employer]. Your experiences and feedback matter to [place].  Please respond so we know how we are doing as we provide care for you.   Cassell Smiles, DNP, AGNP-BC Adult Gerontology Nurse Practitioner [facility], [facility]   Urinary Tract Infection, Adult  A urinary tract infection (UTI) is an infection of any part of the urinary tract. The urinary tract includes the kidneys, ureters, bladder, and urethra. These organs make, store, and get rid of urine in the body. Your health care provider may use other names to describe the infection. An upper UTI affects the ureters and kidneys (pyelonephritis). A lower UTI affects the bladder (cystitis) and urethra (urethritis). What are the causes? Most urinary tract infections are caused by bacteria in your genital area, around the entrance to your urinary tract (urethra). These bacteria grow and cause inflammation of your urinary tract. What increases the risk? You are more likely to develop this condition if:  You have a urinary catheter that stays in place (indwelling).  You are not able to control when you urinate or have a bowel movement (you have  incontinence).  You are female and you: ? Use a spermicide or diaphragm for birth control. ? Have low estrogen levels. ? Are pregnant.  You have certain genes that increase your risk (genetics).  You are sexually active.  You take antibiotic medicines.  You have a condition that causes your flow of urine to slow down, such as: ? An enlarged prostate, if you are female. ? Blockage in your urethra (stricture). ? A kidney stone. ? A nerve condition that affects your bladder control (neurogenic bladder). ? Not getting enough to drink, or not urinating often.  You have certain medical conditions, such as: ? Diabetes. ? A weak disease-fighting system (immunesystem). ? Sickle cell disease. ? Gout. ? Spinal cord injury. What are the signs or symptoms? Symptoms of this condition include:  Needing to urinate right away (urgently).  Frequent urination or passing small amounts of urine frequently.  Pain or burning with urination.  Blood in the urine.  Urine that smells bad or unusual.  Trouble urinating.  Cloudy urine.  Vaginal discharge, if you are female.  Pain in the abdomen or the lower back. You may also have:  Vomiting or a decreased appetite.  Confusion.  Irritability or tiredness.  A fever.  Diarrhea. The first symptom in older adults may be confusion. In some cases, they may not have any symptoms until the infection has worsened. How is this diagnosed? This condition is diagnosed based on your medical history and a physical exam. You may also have other tests,  including:  Urine tests.  Blood tests.  Tests for sexually transmitted infections (STIs). If you have had more than one UTI, a cystoscopy or imaging studies may be done to determine the cause of the infections. How is this treated? Treatment for this condition includes:  Antibiotic medicine.  Over-the-counter medicines to treat discomfort.  Drinking enough water to stay hydrated. If you have  frequent infections or have other conditions such as a kidney stone, you may need to see a health care provider who specializes in the urinary tract (urologist). In rare cases, urinary tract infections can cause sepsis. Sepsis is a life-threatening condition that occurs when the body responds to an infection. Sepsis is treated in the hospital with IV antibiotics, fluids, and other medicines. Follow these instructions at home:  Medicines  Take over-the-counter and prescription medicines only as told by your health care provider.  If you were prescribed an antibiotic medicine, take it as told by your health care provider. Do not stop using the antibiotic even if you start to feel better. General instructions  Make sure you: ? Empty your bladder often and completely. Do not hold urine for long periods of time. ? Empty your bladder after sex. ? Wipe from front to back after a bowel movement if you are female. Use each tissue one time when you wipe.  Drink enough fluid to keep your urine pale yellow.  Keep all follow-up visits as told by your health care provider. This is important. Contact a health care provider if:  Your symptoms do not get better after 1-2 days.  Your symptoms go away and then return. Get help right away if you have:  Severe pain in your back or your lower abdomen.  A fever.  Nausea or vomiting. Summary  A urinary tract infection (UTI) is an infection of any part of the urinary tract, which includes the kidneys, ureters, bladder, and urethra.  Most urinary tract infections are caused by bacteria in your genital area, around the entrance to your urinary tract (urethra).  Treatment for this condition often includes antibiotic medicines.  If you were prescribed an antibiotic medicine, take it as told by your health care provider. Do not stop using the antibiotic even if you start to feel better.  Keep all follow-up visits as told by your health care provider. This  is important. This information is not intended to replace advice given to you by your health care provider. Make sure you discuss any questions you have with your health care provider. Document Released: 05/10/2005 Document Revised: 02/07/2018 Document Reviewed: 02/07/2018 Elsevier Interactive Patient Education  2019 [employer].

## 2018-07-31 NOTE — Progress Notes (Signed)
Subjective:    Patient ID: Kristin [name], female    DOB: [birth year], 77 y.o.   MRN: [id number]  Kristin [name] is a 77 y.o. female presenting on 07/31/2018 for Urinary Tract Infection (urinary frequency, dysuria, gross hematuria and left flank pain  x 2 days )   HPI UTI symptoms Yesterday had urinary frequency.  Progressed today to dysuria, gross hematuria, and left flank pain. Patient reports no other urinary symptoms and reports no prior UTI.   - Pt denies fever, chills, sweats, nausea, vomiting, diarrhea and constipation. - Patient has not had any increase in confusion or memory impairment per her husband who accompanies her today.   Social History   Tobacco Use  . Smoking status: Never Smoker  . Smokeless tobacco: Never Used  Substance Use Topics  . Alcohol use: No    Alcohol/week: 0.0 standard drinks    Comment: Rare  . Drug use: No    Review of Systems Per HPI unless specifically indicated above     Objective:    BP (!) 150/64 (BP Location: Right Arm, Patient Position: Sitting, Cuff Size: Normal)   Pulse 72   Temp 98.1 F (36.7 C) (Oral)   Ht 5\' 5"  (1.651 m)   Wt 146 lb 6.4 oz (66.4 kg)   LMP 08/14/1989   BMI 24.36 kg/m   Wt Readings from Last 3 Encounters:  07/31/18 146 lb 6.4 oz (66.4 kg)  07/16/18 144 lb 9.6 oz (65.6 kg)  07/02/18 146 lb 12.8 oz (66.6 kg)    Physical Exam Vitals signs reviewed.  Constitutional:      General: She is not in acute distress.    Appearance: She is well-developed.  HENT:     Head: Normocephalic and atraumatic.  Cardiovascular:     Rate and Rhythm: Normal rate and regular rhythm.     Pulses:          Radial pulses are 2+ on the right side and 2+ on the left side.       Posterior tibial pulses are 1+ on the right side and 1+ on the left side.     Heart sounds: Normal heart sounds, S1 normal and S2 normal.  Pulmonary:     Effort: Pulmonary effort is normal. No respiratory distress.     Breath sounds: Normal breath  sounds and air entry.  Abdominal:     General: Abdomen is flat. Bowel sounds are normal.     Palpations: Abdomen is soft.     Tenderness: There is no abdominal tenderness. There is no right CVA tenderness or left CVA tenderness.  Musculoskeletal:     Right lower leg: No edema.     Left lower leg: No edema.  Skin:    General: Skin is warm and dry.     Capillary Refill: Capillary refill takes less than 2 seconds.  Neurological:     Mental Status: She is alert and oriented to person, place, and time.  Psychiatric:        Attention and Perception: Attention normal.        Mood and Affect: Mood and affect normal.        Behavior: Behavior normal. Behavior is cooperative.    Results for orders placed or performed in visit on 07/31/18  POCT Urinalysis Dipstick  Result Value Ref Range   Color, UA turbid    Clarity, UA cloudy    Glucose, UA Negative Negative   Bilirubin, UA negative    Ketones,  UA negative    Spec Grav, UA >=1.030 (A) 1.010 - 1.025   Blood, UA large    pH, UA 5.0 5.0 - 8.0   Protein, UA Negative Negative   Urobilinogen, UA 0.2 0.2 or 1.0 E.U./dL   Nitrite, UA negative    Leukocytes, UA Large (3+) (A) Negative   Appearance     Odor        Assessment & Plan:   Problem List Items Addressed This Visit    None    Visit Diagnoses    Dysuria    -  Primary   Relevant Medications   cephALEXin (KEFLEX) 500 MG capsule   Other Relevant Orders   POCT Urinalysis Dipstick (Completed)   Acute cystitis with hematuria       Relevant Medications   cephALEXin (KEFLEX) 500 MG capsule   Other Relevant Orders   Urine Culture    Acute cystitis with hematuria.  Pt symptomatic currently with increased suprapubic pressure x 2 days. Currently without systemic signs or symptoms of infection.   - No current risk of concurrent STI.  Plan: 1. START Keflex 500mg  3 times daily for next 5 days.   - Send Urine culture  - Repeat to ensure resolution of UTI in 2 weeks.  Concern for  incomplete resolution given patient's poor memory/attention to symptoms. 2. Provided non-pharm measures for UTI prevention for good hygiene. 3. Drink plenty of fluids and improve hydration over next 1 week. 4. Provided precautions for severe symptoms requiring ED visit to include no urine in 24-48 hours. 5. Followup 2-5 days as needed for worsening or persistent symptoms.    Meds ordered this encounter  Medications  . cephALEXin (KEFLEX) 500 MG capsule    Sig: Take 1 capsule (500 mg total) by mouth 3 (three) times daily for 5 days.    Dispense:  15 capsule    Refill:  0    Order Specific Question:   Supervising Provider    Answer:   Olin Hauser [[id number]]    Follow up plan: Return in 5-7 days if symptoms worsen or fail to improve AND labs only for urinalysis in 2 weeks.  Cassell Smiles, DNP, AGPCNP-BC Adult Gerontology Primary Care Nurse Practitioner [facility] 07/31/2018, 3:33 PM

## 2018-08-02 ENCOUNTER — Encounter: Payer: Self-pay | Admitting: Cardiovascular Disease

## 2018-08-02 ENCOUNTER — Ambulatory Visit (INDEPENDENT_AMBULATORY_CARE_PROVIDER_SITE_OTHER): Payer: PPO | Admitting: Cardiovascular Disease

## 2018-08-02 VITALS — BP 160/78 | Ht 66.0 in | Wt 146.0 lb

## 2018-08-02 DIAGNOSIS — I25111 Atherosclerotic heart disease of native coronary artery with angina pectoris with documented spasm: Secondary | ICD-10-CM

## 2018-08-02 DIAGNOSIS — I493 Ventricular premature depolarization: Secondary | ICD-10-CM | POA: Diagnosis not present

## 2018-08-02 DIAGNOSIS — I10 Essential (primary) hypertension: Secondary | ICD-10-CM | POA: Diagnosis not present

## 2018-08-02 LAB — URINE CULTURE
MICRO NUMBER:: 91515004
SPECIMEN QUALITY:: ADEQUATE

## 2018-08-02 NOTE — Patient Instructions (Addendum)
Medication Instructions:  You may take your Diltiazem in the evening  If you need a refill on your cardiac medications before your next appointment, please call your pharmacy.   Lab work: None ordered  Testing/Procedures: None ordered  Follow-Up: At [facility], you and your health needs are our priority.  As part of our continuing mission to provide you with exceptional heart care, we have created designated Provider Care Teams.  These Care Teams include your primary Cardiologist (physician) and Advanced Practice Providers (APPs -  Physician Assistants and Nurse Practitioners) who all work together to provide you with the care you need, when you need it. You will need a follow up appointment in 12 months.  Please call our office 2 months in advance to schedule this appointment.  You may see Dr. Fletcher Anon or one of the following Advanced Practice Providers on your designated Care Team:   Murray Hodgkins, NP Christell Faith, PA-C . Marrianne Mood, PA-C  Any Other Special Instructions Will Be Listed Below (If Applicable). Please start wearing compression stockings during the day and then remove them prior to bed.

## 2018-08-02 NOTE — Progress Notes (Signed)
Cardiology Office Note   Date:  08/02/2018   ID:  Kristin [name], Kristin [name] May 28, 1941, MRN [id number]  PCP:  Mikey College, NP  Cardiologist:   Kathlyn Sacramento, MD   Chief Complaint  Patient presents with  . other    DIzziness.Denies SOB and CP.  Medications reviewed verbally.       History of Present Illness: Kristin [name] is a 77 y.o. female who presents for a follow-up visit regarding hypertension and frequent PVCs. 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   She had cardiac catheterization in 05/2012 which showed no significant [name]. Only mild distal LAD and RCA disease She has known palpitations due to PVCs and ventricular bigeminy.  She has known history of refractory hypertension with negative workup for secondary hypertension.    She used to be on multiple blood pressure medications including Spironolactone and losartan-hydrochlorothiazide.  This was discontinued due to volume depletion and hypotension.  She complains of dizziness that happens all the time.  She feels a spinning sensation in her head.  She has not had any recent chest pain or worsening shortness of breath.  She did notice increased leg edema.    Past Medical History:  Diagnosis Date  . Allergy   . Arthritis    recent falls -aug 2010  . [name] (coronary artery disease)    mi-1996  . Complication of anesthesia    pt states takes a long time to wake her wake up  . 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 Cough    no fever   . Degeneration of lumbar or lumbosacral intervertebral disc   . Dizziness    r/t meds  . DVT (deep venous thrombosis) ([place])   . Fibromyalgia   . History of shingles   . [name] (irritable bowel  syndrome)    takes OTC [place] Negus colon health  . Joint pain   . Joint swelling   . Myalgia and myositis, unspecified   . Obstructive sleep apnea (adult) (pediatric)   . Old myocardial infarction 1996  . Osteoarthritis   . Osteoarthrosis, unspecified whether generalized or localized, unspecified site   . Psoriatic arthritis ([place])   . Rheumatoid arthritis ([place])   . Rosacea   . Seasonal allergies   . Stroke [facility])    ischaemic microvascular disease  . 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 Unspecified essential hypertension    takes Diltiazem,Metoprolol,and Losartan daily  . UTI (lower urinary tract infection)    frequent=last one was winter of 2014    Past Surgical History:  Procedure Laterality Date  . ATHERECTOMY  1996  . [place]   vasospams; no blockage  . CARDIAC CATHETERIZATION  2013   No obstructive coronary artery disease. Mild distal LAD and proximal RCA disease.  [name] CATARACT EXTRACTION Bilateral   . COLONOSCOPY    . CT ABD WO/W & PELVIS WO CM  2013   no acute process, + minimal diverticulosis and hepatic/renal cysts, ATH calcifications  . ETHMOIDECTOMY  1998  . 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 NASAL SINUS SURGERY  1998  . OOPHORECTOMY     BSO  . sinus biopsy  1998   inverted papiloma  . VAGINAL HYSTERECTOMY  1992   for mennorhagia-LAVH BSO     Current Outpatient Medications  Medication Sig Dispense Refill  . acetaminophen (TYLENOL) 325 MG tablet Take 325 mg by mouth every 8 (eight) hours as needed (pain).     . ASPIRIN 81 PO Take 1 tablet by mouth daily.    . Calcium Carbonate-Vitamin D (CALTRATE 600+D PO) Take 1 tablet by mouth 2 (two) times daily.     . cephALEXin (KEFLEX) 500 MG capsule Take 1 capsule (500 mg total) by mouth 3  (three) times daily for 5 days. 15 capsule 0  . Coenzyme Q10 (COQ-10) 200 MG CAPS Take by mouth.    . Dextromethorphan Polistirex (DELSYM PO) Take by mouth as needed.    . diltiazem (CARDIZEM CD) 240 MG 24 hr capsule TAKE 1 CAPSULE BY MOUTH DAILY (Patient taking differently: Take 240 mg by mouth daily. ) 30 capsule 2  . DOXYLAMINE SUCCINATE PO Take 25 mg by mouth at bedtime.    . ferrous sulfate 325 (65 FE) MG EC tablet Take 325 mg by mouth 3 (three) times daily with meals.    [name] FOLIC ACID PO Take 096 mg by mouth daily.     . Ibuprofen (MOTRIN PO) Take 1 tablet by mouth at bedtime.    . Multiple Vitamin (MULTIVITAMIN) tablet Take 1 tablet by mouth daily.      . nitroGLYCERIN (NITROSTAT) 0.4 MG SL tablet Place 1 tablet (0.4 mg total) every 5 (five) minutes as needed under the tongue for chest pain. 30 tablet 3  . NON FORMULARY 3,000 mg. CBD Tincture    . Omega-3 Fatty Acids (FISH OIL) 1200 MG CAPS Take 1 capsule by mouth 2 (two) times daily.     [name] OVER THE COUNTER MEDICATION Take 3 tablets by mouth daily.    Vladimir Faster Glycol-Propyl Glycol (SYSTANE) 0.4-0.3 % SOLN Apply 1 drop to eye daily as needed (dry eyes).     . Probiotic Product ([place]) CAPS Take 1 capsule by mouth daily.    . traMADol (ULTRAM) 50 MG tablet Take 1 tablet (50 mg total) by mouth every 12 (twelve) hours as needed for moderate pain. 60 tablet 2  . triamcinolone cream (KENALOG) 0.1 %     . Turmeric 500 MG CAPS Take by mouth.    . vitamin B-12 (CYANOCOBALAMIN) 1000 MCG tablet Take 1,000 mcg by mouth daily.     No current facility-administered medications for this visit.     Allergies:   Angiotensin receptor blockers; Other; Hydralazine; Latex; Losartan; Methotrexate derivatives; Spironolactone; and Penicillins    Social History:  The patient  reports that she has never smoked. She has never used smokeless tobacco. She reports that she does not drink alcohol or use drugs.   Family History:  The patient's  family history includes Breast cancer in her paternal aunt; Colon cancer in her father; Coronary artery disease in her mother; GER disease in her sister and sister; Heart attack in her mother; Heart disease in her mother; Heart failure in her maternal grandfather, maternal grandmother, and mother; Hypertension in her sister; Osteopenia in her sister; Osteoporosis in her mother; Skin cancer in her sister.    ROS:  Please see the history of present illness.   Otherwise, review of systems are positive for  none.   All other systems are reviewed and negative.    PHYSICAL EXAM: VS:  BP (!) 160/78 (BP Location: Left Arm, Patient Position: Sitting, Cuff Size: Normal)   Ht 5\' 6"  (1.676 m)   Wt 146 lb (66.2 kg)   LMP 08/14/1989   BMI 23.57 kg/m  , BMI Body mass index is 23.57 kg/m. GEN: Well nourished, well developed, in no acute distress  HEENT: normal  Neck: no JVD, carotid bruits, or masses Cardiac: RRR; no murmurs, rubs, or gallops,no edema  Respiratory:  clear to auscultation bilaterally, normal work of breathing GI: soft, nontender, nondistended, + BS MS: no deformity or atrophy  Skin: warm and dry, no rash Neuro:  Strength and sensation are intact Psych: euthymic mood, full affect   EKG:  EKG is ordered today. Normal sinus rhythm with nonspecific T wave changes.    Recent Labs: 02/21/2018: ALT 15 06/14/2018: BUN 24; Creatinine, Ser 0.88; Hemoglobin 13.4; Platelets 209; Potassium 3.3; Sodium 140    Lipid Panel    Component Value Date/Time   CHOL 229 (H) 02/21/2018 1035   TRIG 106.0 02/21/2018 1035   HDL 73.20 02/21/2018 1035   CHOLHDL 3 02/21/2018 1035   VLDL 21.2 02/21/2018 1035   LDLCALC 134 (H) 02/21/2018 1035   LDLDIRECT 158.1 10/02/2013 0914      Wt Readings from Last 3 Encounters:  08/02/18 146 lb (66.2 kg)  07/31/18 146 lb 6.4 oz (66.4 kg)  07/16/18 144 lb 9.6 oz (65.6 kg)        ASSESSMENT AND PLAN:  1. Coronary artery disease involving native coronary  arteries with previous spasm: No anginal symptoms. Continue medical therapy.  2. 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  3.  Essential hypertension: Blood pressure is mildly elevated.  She used to be on multiple antihypertensive medication which were discontinued due to hypotension and volume depletion.  Consider resuming losartan if blood pressure continues to be elevated.  4.  Dizziness with possible component of vertigo.  Her symptoms are not suggestive of orthostatic hypotension.  There might be a component of vertigo and ENT evaluation of this might be helpful. Her PVCs are well controlled and thus unlikely to be causing any of her symptoms.    Disposition:   FU with me in 12 months  Signed,  Kathlyn Sacramento, MD  08/02/2018 1:48 PM    [place]

## 2018-08-12 ENCOUNTER — Other Ambulatory Visit: Payer: Self-pay | Admitting: Cardiovascular Disease

## 2018-08-19 ENCOUNTER — Encounter: Payer: Self-pay | Admitting: Nurse Practitioner

## 2018-08-19 ENCOUNTER — Ambulatory Visit (INDEPENDENT_AMBULATORY_CARE_PROVIDER_SITE_OTHER): Payer: PPO | Admitting: Nurse Practitioner

## 2018-08-19 ENCOUNTER — Other Ambulatory Visit: Payer: Self-pay

## 2018-08-19 VITALS — BP 139/52 | HR 74 | Temp 98.1°F | Ht 66.0 in | Wt 144.6 lb

## 2018-08-19 DIAGNOSIS — R42 Dizziness and giddiness: Secondary | ICD-10-CM | POA: Diagnosis not present

## 2018-08-19 DIAGNOSIS — N3001 Acute cystitis with hematuria: Secondary | ICD-10-CM

## 2018-08-19 DIAGNOSIS — R300 Dysuria: Secondary | ICD-10-CM | POA: Diagnosis not present

## 2018-08-19 LAB — POCT URINALYSIS DIPSTICK
Bilirubin, UA: NEGATIVE
Glucose, UA: NEGATIVE
Ketones, UA: NEGATIVE
Nitrite, UA: NEGATIVE
Odor: NEGATIVE
Protein, UA: POSITIVE — AB
Spec Grav, UA: >=1.03 — AB (ref 1.010–1.025)
Urobilinogen, UA: 0.2 E.U./dL
pH, UA: 5 (ref 5.0–8.0)

## 2018-08-19 MED ORDER — CEPHALEXIN 500 MG PO CAPS: 500.0000 mg | ORAL_CAPSULE | Freq: Three times a day (TID) | ORAL | 0 refills | Status: AC

## 2018-08-19 NOTE — Patient Instructions (Addendum)
Kristin [name],   Thank you for coming in to clinic today.  1. START keflex 500 mg three times daily for 7 days.  Please schedule a follow-up appointment with Cassell Smiles, AGNP. Return if symptoms worsen or fail to improve.  If you have any other questions or concerns, please feel free to call the clinic or send a message through [place]. You may also schedule an earlier appointment if necessary.  You will receive a survey after today's visit either digitally by e-mail or paper by [employer]. Your experiences and feedback matter to [place].  Please respond so we know how we are doing as we provide care for you.   Cassell Smiles, DNP, AGNP-BC Adult Gerontology Nurse Practitioner [place]

## 2018-08-19 NOTE — Progress Notes (Signed)
Subjective:    Patient ID: Kristin [name], female    DOB: Sep 22, 1940, 79 y.o.   MRN: [id number]  Kristin [name] is a 78 y.o. female presenting on 08/19/2018 for Dysuria (urinary frequency x 1day)   HPI UTI symptoms Patient had resolution of symptoms after initial antiboitics.  Felt better for about 2 weeks, but now has burning with voiding, frequent voids, has mild back pain (but is chronic). - Drinks lots of water without relief.   Dizziness Symptoms persist - has not had appointment for vestibular therapy yet.  Requests assistance with referral.  Please also see note from 07/16/2018 for additional visit for dizziness.  Patient has had several visits in last 1 year with ENT, neurology and prior PCP.  She also had ED visit on 06/14/2018 for vertigo.  Social History   Tobacco Use  . Smoking status: Never Smoker  . Smokeless tobacco: Never Used  Substance Use Topics  . Alcohol use: No    Alcohol/week: 0.0 standard drinks    Comment: Rare  . Drug use: No    Review of Systems Per HPI unless specifically indicated above     Objective:    BP (!) 139/52 (BP Location: Right Arm, Patient Position: Sitting, Cuff Size: Normal)   Pulse 74   Temp 98.1 F (36.7 C) (Oral)   Ht 5\' 6"  (1.676 m)   Wt 144 lb 9.6 oz (65.6 kg)   LMP 08/14/1989   BMI 23.34 kg/m   Wt Readings from Last 3 Encounters:  08/19/18 144 lb 9.6 oz (65.6 kg)  08/02/18 146 lb (66.2 kg)  07/31/18 146 lb 6.4 oz (66.4 kg)    Physical Exam Vitals signs reviewed.  Constitutional:      General: She is not in acute distress.    Appearance: She is well-developed.  HENT:     Head: Normocephalic and atraumatic.  Cardiovascular:     Rate and Rhythm: Normal rate and regular rhythm.     Pulses:          Radial pulses are 2+ on the right side and 2+ on the left side.       Posterior tibial pulses are 1+ on the right side and 1+ on the left side.     Heart sounds: Normal heart sounds, S1 normal and S2 normal.    Pulmonary:     Effort: Pulmonary effort is normal. No respiratory distress.     Breath sounds: Normal breath sounds and air entry.  Abdominal:     General: Abdomen is flat. Bowel sounds are normal. There is no distension.     Palpations: Abdomen is soft.     Tenderness: There is abdominal tenderness in the suprapubic area. There is no right CVA tenderness or left CVA tenderness.     Hernia: No hernia is present.  Musculoskeletal:     Right lower leg: No edema.     Left lower leg: No edema.  Skin:    General: Skin is warm and dry.     Capillary Refill: Capillary refill takes less than 2 seconds.  Neurological:     Mental Status: She is alert and oriented to person, place, and time.  Psychiatric:        Attention and Perception: Attention normal.        Mood and Affect: Mood and affect normal.        Behavior: Behavior normal. Behavior is cooperative.     Results for orders placed or performed in  visit on 08/19/18  Urine Culture  Result Value Ref Range   MICRO NUMBER: [id number]    SPECIMEN QUALITY: Adequate    Sample Source URINE    STATUS: FINAL    ISOLATE 1:      Multiple organisms present, each less than 10,000 CFU/mL. These organisms, commonly found on external and internal genitalia, are considered to be colonizers. No further testing performed.  POCT Urinalysis Dipstick  Result Value Ref Range   Color, UA turbid    Clarity, UA cloudy    Glucose, UA Negative Negative   Bilirubin, UA neg    Ketones, UA neg    Spec Grav, UA >=1.030 (A) 1.010 - 1.025   Blood, UA large    pH, UA 5.0 5.0 - 8.0   Protein, UA Positive (A) Negative   Urobilinogen, UA 0.2 0.2 or 1.0 E.U./dL   Nitrite, UA negative    Leukocytes, UA Large (3+) (A) Negative   Appearance     Odor negative       Assessment & Plan:   Problem List Items Addressed This Visit    None    Visit Diagnoses    Dysuria    -  Primary   Relevant Medications   cephALEXin (KEFLEX) 500 MG capsule   Other Relevant  Orders   POCT Urinalysis Dipstick (Completed)   Urine Culture (Completed)   Acute cystitis with hematuria       Relevant Medications   cephALEXin (KEFLEX) 500 MG capsule   Other Relevant Orders   Urine Culture (Completed)   Dizziness       Relevant Orders   Ambulatory referral to ENT      # Acute cystitis with hematuria on dipstick today.  Pt symptomatic currently with increased suprapubic pressure x 1 days. Currently without systemic signs or symptoms of infection.   - Cannot exclude differentials of atrophic vaginitis, overactive bladder, bladder prolapse as cause of symptoms.  Given UA dipstick today, will treat for cystitis and await culture. - No current risk of concurrent STI.  Plan: 1. START Keflex 500mg  3 times daily for next 7 days.   - Send Urine culture  2. Provided non-pharm measures for UTI prevention for good hygiene. 3. Drink plenty of fluids and improve hydration over next 1 week. 4. Provided precautions for severe symptoms requiring ED visit to include no urine in 24-48 hours. 5. Followup 2-5 days as needed for worsening or persistent symptoms.  May require Urology visit in future if symptoms are not resolved and patient has recurrent infections.  May also benefit from AZO prn for urinary symptoms after prior confirmed e.Coli infection-UTI.   # Dizziness Follow-up from prior referral indicates patient still awaiting information.  First referral was cancelled in transition to new PCP.  New referral placed today for vestibular therapy.  Follow-up prn.  Meds ordered this encounter  Medications  . cephALEXin (KEFLEX) 500 MG capsule    Sig: Take 1 capsule (500 mg total) by mouth 3 (three) times daily for 7 days.    Dispense:  21 capsule    Refill:  0    Order Specific Question:   Supervising Provider    Answer:   Olin Hauser [[id number]]    Follow up plan: Return if symptoms worsen or fail to improve.  Cassell Smiles, DNP, AGPCNP-BC Adult Gerontology  Primary Care Nurse Practitioner [facility] 08/19/2018, 3:15 PM

## 2018-08-20 LAB — URINE CULTURE
MICRO NUMBER:: 15695
SPECIMEN QUALITY:: ADEQUATE

## 2018-08-21 ENCOUNTER — Encounter: Payer: Self-pay | Admitting: Nurse Practitioner

## 2018-08-22 ENCOUNTER — Encounter: Payer: Self-pay | Admitting: Podiatry

## 2018-08-22 ENCOUNTER — Ambulatory Visit: Payer: PPO | Admitting: Podiatry

## 2018-08-22 DIAGNOSIS — M2041 Other hammer toe(s) (acquired), right foot: Secondary | ICD-10-CM

## 2018-08-22 DIAGNOSIS — L84 Corns and callosities: Secondary | ICD-10-CM | POA: Diagnosis not present

## 2018-08-22 DIAGNOSIS — M2042 Other hammer toe(s) (acquired), left foot: Secondary | ICD-10-CM | POA: Diagnosis not present

## 2018-08-22 DIAGNOSIS — M205X9 Other deformities of toe(s) (acquired), unspecified foot: Secondary | ICD-10-CM

## 2018-08-22 NOTE — Progress Notes (Signed)
This patient presents the office with chief complaint of a painful callus on the inside of her second toe left foot.  She says this area has become painful for the last few weeks.  She says she has been using cotton balls between her toes and other separating tools but the problem persists.  She presents the office today wearing a plastic bandage separating the first and second toes left foot.  Patient has a history of RA in her hands.  She presents the office today for an evaluation of this painful corn second toe left foot.  Vascular  Dorsalis pedis and posterior tibial pulses are palpable  B/L.  Capillary return  WNL.  Temperature gradient is  WNL.  Skin turgor  WNL  Sensorium  Senn Weinstein monofilament wire  WNL. Normal tactile sensation.  Nail Exam  Patient has normal nails with no evidence of bacterial or fungal infection.  Orthopedic  Exam  Muscle tone and muscle strength  WNL.  No limitations of motion feet  B/L.  No crepitus or joint effusion noted.  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  Skin  No open lesions.  Normal skin texture and turgor.  Corn noted medially DIPJ second toe left foot  Hammer toe second.  HAV  B/L.  Corn second toe left foot.RTC prn.   Gardiner Barefoot DPM

## 2018-08-26 DIAGNOSIS — R42 Dizziness and giddiness: Secondary | ICD-10-CM | POA: Diagnosis not present

## 2018-09-02 ENCOUNTER — Ambulatory Visit: Payer: PPO | Admitting: Diagnostic Neuroimaging

## 2018-09-13 DIAGNOSIS — R42 Dizziness and giddiness: Secondary | ICD-10-CM | POA: Diagnosis not present

## 2018-09-13 DIAGNOSIS — H6121 Impacted cerumen, right ear: Secondary | ICD-10-CM | POA: Diagnosis not present

## 2018-09-23 DIAGNOSIS — R42 Dizziness and giddiness: Secondary | ICD-10-CM | POA: Diagnosis not present

## 2018-09-25 ENCOUNTER — Ambulatory Visit (INDEPENDENT_AMBULATORY_CARE_PROVIDER_SITE_OTHER): Payer: PPO | Admitting: Family Medicine

## 2018-09-25 ENCOUNTER — Encounter: Payer: Self-pay | Admitting: Family Medicine

## 2018-09-25 VITALS — BP 155/63 | HR 73 | Temp 97.9°F | Resp 16 | Ht 66.0 in | Wt 144.6 lb

## 2018-09-25 DIAGNOSIS — N3001 Acute cystitis with hematuria: Secondary | ICD-10-CM

## 2018-09-25 DIAGNOSIS — N390 Urinary tract infection, site not specified: Secondary | ICD-10-CM

## 2018-09-25 DIAGNOSIS — R310 Gross hematuria: Secondary | ICD-10-CM

## 2018-09-25 LAB — POCT URINALYSIS DIPSTICK
Bilirubin, UA: NEGATIVE
GLUCOSE UA: NEGATIVE
KETONES UA: NEGATIVE
NITRITE UA: NEGATIVE
PROTEIN UA: POSITIVE — AB
SPEC GRAV UA: 1.025 (ref 1.010–1.025)
Urobilinogen, UA: 0.2 E.U./dL
pH, UA: 5 (ref 5.0–8.0)

## 2018-09-25 MED ORDER — NITROFURANTOIN MONOHYD MACRO 100 MG PO CAPS: 100.0000 mg | ORAL_CAPSULE | Freq: Two times a day (BID) | ORAL | 0 refills | Status: DC

## 2018-09-25 NOTE — Patient Instructions (Addendum)
Thank you for coming to the office today.  1. You have a Urinary Tract Infection - this is very common, your symptoms are reassuring and you should get better within 1 week on the antibiotics - Start Macrobid 100mg  2 times daily for next 7 days, complete entire course, even if feeling better - We sent urine for a culture, we will call you within next few days if we need to change antibiotics - We will check urine under microscope - Please drink plenty of fluids, improve hydration over next 1 week  If symptoms worsening, developing nausea / vomiting, worsening back pain, fevers / chills / sweats, then please return for re-evaluation sooner.  [facility] -[address]st floor [place],    [address] Phone: [phone]  Dr Hollice Espy  To prevent bladder and kidney infections...  Wipe front to back after using the restroom  Drink enough water to keep your pee clear to pale yellow  If you think you are getting another bladder infection, start drinking cranberry juice and come see [place] so we can check the urine.   Please schedule a Follow-up Appointment to: Return in about 2 weeks (around 10/09/2018), or if symptoms worsen or fail to improve, for UTI.  If you have any other questions or concerns, please feel free to call the office or send a message through [place]. You may also schedule an earlier appointment if necessary.  Additionally, you may be receiving a survey about your experience at our office within a few days to 1 week by e-mail or mail. We value your feedback.  Nobie Putnam, DO [place]

## 2018-09-25 NOTE — Progress Notes (Signed)
Subjective:    Patient ID: Kristin [name], female    DOB: [birth year], 78 y.o.   MRN: [id number]  Kristin [name] is a 78 y.o. female presenting on 09/25/2018 for Urinary Tract Infection (bloody discharge, urgency, burning onset 4 days  no OTC taken)  Patient presents for a same day appointment.  PCP: Cassell Smiles, AGPCNP-BC  HPI  Acute UTI, recurrent / Dysuria / Frequency / Gross Hematuria - Recent history seen by PCP twice in past 2 months, 12/18 and 08/19/18 for UTI symptoms Dysuria, first visit ni December treated with Keflex for confirmed E Coli UTI on urine culture, then repeat testing did not show confirmed UTI in 08/2018 treated again with keflex with improvement and now 4-5 weeks later recurrence of symptoms. - Today she describes very similar dysuria, frequency urination, some blood on toilet paper with wiping and rarely in urine, similar to prior episodes. - She has not taken any OTC medication - Known PCN allergy, has had other meds before for UTI in past without problem - No known history of kidney stone - Denies any fever, chills, flank pain, nausea vomiting, abdominal pain   Depression screen [facility] 2/9 02/21/2018 02/19/2017 01/28/2016  Decreased Interest 0 0 0  Down, Depressed, Hopeless 0 0 0  PHQ - 2 Score 0 0 0  Altered sleeping 0 - -  Tired, decreased energy 0 - -  Change in appetite 0 - -  Feeling bad or failure about yourself  0 - -  Trouble concentrating 0 - -  Moving slowly or fidgety/restless 0 - -  Suicidal thoughts 0 - -  PHQ-9 Score 0 - -  Difficult doing work/chores Not difficult at all - -  Some recent data might be hidden    Social History   Tobacco Use  . Smoking status: Never Smoker  . Smokeless tobacco: Never Used  Substance Use Topics  . Alcohol use: No    Alcohol/week: 0.0 standard drinks    Comment: Rare  . Drug use: No    Review of Systems Per HPI unless specifically indicated above     Objective:    BP (!) 155/63   Pulse 73    Temp 97.9 F (36.6 C) (Oral)   Resp 16   Ht 5\' 6"  (1.676 m)   Wt 144 lb 9.6 oz (65.6 kg)   LMP 08/14/1989   BMI 23.34 kg/m   Wt Readings from Last 3 Encounters:  09/25/18 144 lb 9.6 oz (65.6 kg)  08/19/18 144 lb 9.6 oz (65.6 kg)  08/02/18 146 lb (66.2 kg)    Physical Exam Vitals signs and nursing note reviewed.  Constitutional:      General: She is not in acute distress.    Appearance: She is well-developed. She is not diaphoretic.     Comments: Well-appearing, comfortable, cooperative  HENT:     Head: Normocephalic and atraumatic.  Eyes:     General:        Right eye: No discharge.        Left eye: No discharge.     Conjunctiva/sclera: Conjunctivae normal.  Cardiovascular:     Rate and Rhythm: Normal rate.  Pulmonary:     Effort: Pulmonary effort is normal.  Abdominal:     General: Bowel sounds are normal. There is no distension.     Palpations: Abdomen is soft. There is no mass.     Tenderness: There is no abdominal tenderness.     Comments: No distended bladder  Musculoskeletal:     Comments: No CVAT  Skin:    General: Skin is warm and dry.     Findings: No erythema or rash.  Neurological:     Mental Status: She is alert and oriented to person, place, and time.  Psychiatric:        Behavior: Behavior normal.     Comments: Well groomed, good eye contact, normal speech and thoughts    Results for orders placed or performed in visit on 09/25/18  POCT urinalysis dipstick  Result Value Ref Range   Color, UA Bright red    Clarity, UA cloudy    Glucose, UA Negative Negative   Bilirubin, UA Negative    Ketones, UA Negative    Spec Grav, UA 1.025 1.010 - 1.025   Blood, UA large    pH, UA 5.0 5.0 - 8.0   Protein, UA Positive (A) Negative   Urobilinogen, UA 0.2 0.2 or 1.0 E.U./dL   Nitrite, UA Negative    Leukocytes, UA Moderate (2+) (A) Negative   Appearance cloudy    Odor foul       Assessment & Plan:   Problem List Items Addressed This Visit    None      Visit Diagnoses    Acute cystitis with hematuria    -  Primary   Relevant Medications   nitrofurantoin, macrocrystal-monohydrate, (MACROBID) 100 MG capsule   Other Relevant Orders   POCT urinalysis dipstick (Completed)   Urine Culture   Urine Microscopic   Gross hematuria       Relevant Orders   POCT urinalysis dipstick (Completed)   Urine Culture   Urine Microscopic   Ambulatory referral to Urology   Recurrent UTI       Relevant Medications   nitrofurantoin, macrocrystal-monohydrate, (MACROBID) 100 MG capsule   Other Relevant Orders   POCT urinalysis dipstick (Completed)   Urine Culture   Urine Microscopic   Ambulatory referral to Urology      Clinically with recurrent UTI. Recent E Coli UTI. Seems x 3 UTI in past 2 months. Complicated with possible gross hematuria by report. Improve temporarily on Keflex x2 courses  PCP was concerned about recurrent UTI and hematuria at last visit, recommended referral.  Plan - Urine dipstick today consistent with UTI, concern with large blood - Send urine for urine culture and urine microscopy as well today - Start Macrobid 100mg  BID x 7 days for UTI - prior E Coli culture was pan sensitive but we will follow this result and change if needed, next would recommend Cipro - Referral to [facility] for 2nd opinion on recurrent UTI in short time span, and hematuria - likely gross vs microscopic, pending results further  Strict return criteria reviewed with patient if not improving, when to seek more immediate care at hospital worse UTI vs Pyelo.  Forward chart to PCP for review   Meds ordered this encounter  Medications  . nitrofurantoin, macrocrystal-monohydrate, (MACROBID) 100 MG capsule    Sig: Take 1 capsule (100 mg total) by mouth 2 (two) times daily. X 7 days    Dispense:  14 capsule    Refill:  0   Orders Placed This Encounter  Procedures  . Urine Culture  . Urine Microscopic  . Ambulatory referral to Urology    Referral  Priority:   Routine    Referral Type:   Consultation    Referral Reason:   Specialty Services Required    Referred to Provider:  Hollice Espy, MD    Requested Specialty:   Urology    Number of Visits Requested:   1  . POCT urinalysis dipstick     Follow up plan: Return in about 2 weeks (around 10/09/2018), or if symptoms worsen or fail to improve, for UTI.  Nobie Putnam, [facility] 09/25/2018, 10:45 AM

## 2018-09-26 LAB — URINALYSIS, MICROSCOPIC ONLY
BACTERIA UA: NONE SEEN /HPF
HYALINE CAST: NONE SEEN /LPF

## 2018-09-26 LAB — URINE CULTURE
MICRO NUMBER:: 186012
RESULT: NO GROWTH
SPECIMEN QUALITY:: ADEQUATE

## 2018-09-30 DIAGNOSIS — R42 Dizziness and giddiness: Secondary | ICD-10-CM | POA: Diagnosis not present

## 2018-10-07 DIAGNOSIS — R42 Dizziness and giddiness: Secondary | ICD-10-CM | POA: Diagnosis not present

## 2018-10-14 DIAGNOSIS — R42 Dizziness and giddiness: Secondary | ICD-10-CM | POA: Diagnosis not present

## 2018-10-21 DIAGNOSIS — R42 Dizziness and giddiness: Secondary | ICD-10-CM | POA: Diagnosis not present

## 2018-10-23 ENCOUNTER — Other Ambulatory Visit: Payer: Self-pay

## 2018-10-23 ENCOUNTER — Encounter: Payer: Self-pay | Admitting: Urology

## 2018-10-23 ENCOUNTER — Ambulatory Visit: Payer: PPO | Admitting: Urology

## 2018-10-23 VITALS — BP 142/78 | HR 82 | Ht 67.0 in | Wt 142.0 lb

## 2018-10-23 DIAGNOSIS — R310 Gross hematuria: Secondary | ICD-10-CM

## 2018-10-23 DIAGNOSIS — Z87440 Personal history of urinary (tract) infections: Secondary | ICD-10-CM | POA: Diagnosis not present

## 2018-10-23 DIAGNOSIS — N952 Postmenopausal atrophic vaginitis: Secondary | ICD-10-CM | POA: Diagnosis not present

## 2018-10-23 LAB — URINALYSIS, COMPLETE
BILIRUBIN UA: NEGATIVE
GLUCOSE, UA: NEGATIVE
Ketones, UA: NEGATIVE
Leukocytes, UA: NEGATIVE
NITRITE UA: NEGATIVE
PH UA: 6 (ref 5.0–7.5)
Protein, UA: NEGATIVE
RBC, UA: NEGATIVE
Specific Gravity, UA: 1.02 (ref 1.005–1.030)
UUROB: 0.2 mg/dL (ref 0.2–1.0)

## 2018-10-23 LAB — BLADDER SCAN AMB NON-IMAGING

## 2018-10-23 NOTE — Progress Notes (Signed)
10/23/2018 11:08 AM   [name] 02-02-1941 [id number]  Referring provider: Mikey College, NP [place], [place] [address]  Chief Complaint  Patient presents with  . New Patient (Initial Visit)    gross hematuria    HPI: 78 year old female who presents today for further evaluation of recent UTI with associated gross hematuria and ongoing urinary symptoms.  She initially presented to her primary care physician on 07/31/2018 with symptoms of dysuria urgency frequency, dysuria, and gross hematuria consistent with UTI.  Her UA at that time was grossly positive and she ended up growing E. coli which was pansensitive and treated appropriately.  Her symptoms returned after completion of antibiotics and she was reevaluated both on 08/19/2018 and 09/25/2018 with recurrent symptoms.  On these occasions, her urine was persistently suspicious however, her urine cultures were negative.  She denies any UTI symptoms today.  No urgency or frequency.  No dysuria.    Not sexually active.    Never smoker.  No history of kidney stones.  No flank pain but does have chronic low back pain.    Last upper tract imaging 2013 with tiny renal cysts in the form of Ct abd/ pelvis with contrast.  She has a remote history of UTIs but prior to December, these are relatively rare in number and frequency.  PMH: Past Medical History:  Diagnosis Date  . Allergy   . Arthritis    recent falls -aug 2010  . [name] (coronary artery disease)    mi-1996  . Complication of anesthesia    pt states takes a long time to wake her wake up  . 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 Cough    no fever   . Degeneration of lumbar or lumbosacral intervertebral disc   . Dizziness    r/t meds  . DVT (deep venous thrombosis) ([place])   . Fibromyalgia   . History of shingles   . [name] (irritable bowel syndrome)    takes OTC [place] Negus colon  health  . Joint pain   . Joint swelling   . Myalgia and myositis, unspecified   . Obstructive sleep apnea (adult) (pediatric)   . Old myocardial infarction 1996  . Osteoarthritis   . Osteoarthrosis, unspecified whether generalized or localized, unspecified site   . Psoriatic arthritis ([place])   . Rheumatoid arthritis ([place])   . Rosacea   . Seasonal allergies   . Stroke [facility])    ischaemic microvascular disease  . 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 Unspecified essential hypertension    takes Diltiazem,Metoprolol,and Losartan daily  . UTI (lower urinary tract infection)    frequent=last one was winter of 2014    Surgical History: Past Surgical History:  Procedure Laterality Date  . ATHERECTOMY  1996  . [place]   vasospams; no blockage  . CARDIAC CATHETERIZATION  2013   No obstructive coronary artery disease. Mild distal LAD and proximal RCA disease.  [name] CATARACT EXTRACTION Bilateral   . COLONOSCOPY    . CT ABD WO/W & PELVIS WO CM  2013   no acute process, + minimal diverticulosis and hepatic/renal cysts, ATH calcifications  . ETHMOIDECTOMY  1998  . 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 NASAL SINUS SURGERY  1998  . OOPHORECTOMY     BSO  . sinus biopsy  1998   inverted papiloma  . VAGINAL HYSTERECTOMY  1992   for mennorhagia-LAVH BSO    Home Medications:  Allergies as of 10/23/2018      Reactions   Angiotensin Receptor Blockers Anaphylaxis   Fatigue & Dizziness   Other Anaphylaxis   Other reaction(s): Other (See Comments) Dizziness, rash Fatigue & Dizziness   Hydralazine Other (See Comments)   Unknown   Latex    REACTION: Rash, blisters, breathing probs.   Losartan    Dizziness   Methotrexate Derivatives    Dizziness, rash    Spironolactone    Dizziness   Penicillins Hives, Rash      Medication List       Accurate as of October 23, 2018 11:08 AM. Always use your most recent med list.        acetaminophen 325 MG tablet Commonly known as:  TYLENOL Take 325 mg by mouth every 8 (eight) hours as needed (pain).   ASPIRIN 81 PO Take 1 tablet by mouth daily.   CALTRATE 600+D PO Take 1 tablet by mouth 2 (two) times daily.   CoQ-10 200 MG Caps Take by mouth.   DELSYM PO Take by mouth as needed.   diltiazem 240 MG 24 hr capsule Commonly known as:  CARDIZEM CD TAKE 1 CAPSULE EVERY DAY   DOXYLAMINE SUCCINATE PO Take 25 mg by mouth at bedtime.   ferrous sulfate 325 (65 FE) MG EC tablet Take 325 mg by mouth 3 (three) times daily with meals.   Fish Oil 1200 MG Caps Take 1 capsule by mouth 2 (two) times daily.   FOLIC ACID PO Take 093 mg by mouth daily.   MOTRIN PO Take 1 tablet by mouth at bedtime.   multivitamin tablet Take 1 tablet by mouth daily.   nitrofurantoin (macrocrystal-monohydrate) 100 MG capsule Commonly known as:  Macrobid Take 1 capsule (100 mg total) by mouth 2 (two) times daily. X 7 days   nitroGLYCERIN 0.4 MG SL tablet Commonly known as:  Nitrostat Place 1 tablet (0.4 mg total) every 5 (five) minutes as needed under the tongue for chest pain.   NON FORMULARY 3,000 mg. CBD Tincture   OVER THE COUNTER MEDICATION Take 3 tablets by mouth daily.   [facility] Colon Health Caps Take 1 capsule by mouth daily.   Systane 0.4-0.3 % Soln Generic drug:  Polyethyl Glycol-Propyl Glycol Apply 1 drop to eye daily as needed (dry eyes).   traMADol 50 MG tablet Commonly known as:  ULTRAM Take 1 tablet (50 mg total) by mouth every 12 (twelve) hours as needed for moderate pain.   triamcinolone cream 0.1 % Commonly known as:  KENALOG   Turmeric 500 MG Caps Take by mouth.   vitamin B-12 1000 MCG tablet Commonly known as:  CYANOCOBALAMIN Take 1,000 mcg by mouth daily.        Allergies:  Allergies  Allergen Reactions  . Angiotensin Receptor Blockers Anaphylaxis    Fatigue & Dizziness  . Other Anaphylaxis    Other reaction(s): Other (See Comments) Dizziness, rash Fatigue & Dizziness  . Hydralazine Other (See Comments)    Unknown  . Latex     REACTION: Rash, blisters, breathing probs.  . Losartan     Dizziness   . Methotrexate Derivatives     Dizziness, rash   . Spironolactone  Dizziness   . Penicillins Hives and Rash    Family History: Family History  Problem Relation Age of Onset  . Colon cancer Father   . Heart attack Mother   . Coronary artery disease Mother   . Osteoporosis Mother   . Heart disease Mother   . Heart failure Mother   . GER disease Sister   . Skin cancer Sister   . Hypertension Sister   . Osteopenia Sister   . GER disease Sister   . Breast cancer Paternal Aunt        age 33  . Heart failure Maternal Grandmother   . Heart failure Maternal Grandfather     Social History:  reports that she has never smoked. She has never used smokeless tobacco. She reports that she does not drink alcohol or use drugs.  ROS: UROLOGY Frequent Urination?: No Hard to postpone urination?: No Burning/pain with urination?: No Get up at night to urinate?: Yes Leakage of urine?: No Urine stream starts and stops?: No Trouble starting stream?: No Do you have to strain to urinate?: No Blood in urine?: Yes Urinary tract infection?: No Sexually transmitted disease?: No Injury to kidneys or bladder?: No Painful intercourse?: No Weak stream?: No Currently pregnant?: No Vaginal bleeding?: No Last menstrual period?: n  Gastrointestinal Nausea?: No Vomiting?: No Indigestion/heartburn?: No Diarrhea?: No Constipation?: No  Constitutional Fever: No Night sweats?: No Weight loss?: No Fatigue?: No  Skin Skin rash/lesions?: No Itching?: No  Eyes Blurred vision?: No Double vision?: No  Ears/Nose/Throat Sore throat?: No  Sinus problems?: No  Hematologic/Lymphatic Swollen glands?: No Easy bruising?: Yes  Cardiovascular Leg swelling?: No Chest pain?: No  Respiratory Cough?: No Shortness of breath?: No  Endocrine Excessive thirst?: No  Musculoskeletal Back pain?: Yes Joint pain?: Yes  Neurological Headaches?: No Dizziness?: Yes  Psychologic Depression?: No Anxiety?: No  Physical Exam: BP (!) 142/78 (BP Location: Left Arm, Patient Position: Sitting)   Pulse 82   Ht 5\' 7"  (1.702 m)   Wt 142 lb (64.4 kg)   LMP 08/14/1989   BMI 22.24 kg/m   Constitutional:  Alert and oriented, No acute distress. HEENT:  AT, moist mucus membranes.  Trachea midline, no masses. Cardiovascular: No clubbing, cyanosis, or edema. Respiratory: Normal respiratory effort, no increased work of breathing. GI: Abdomen is soft, nontender, nondistended, no abdominal masses GU: No CVA tenderness Pelvic: Chaperoned by CMA, normal external genitalia with atrophic vaginal mucosa appreciated.  Normal urethral meatus.  Patient was prepped using Betadine solution and a 14 French straight cath was used to intubate the urethra at which time approximate 30 cc of clear yellow urine was drained. Skin: No rashes, bruises or suspicious lesions. Neurologic: Grossly intact, no focal deficits, moving all 4 extremities. Psychiatric: Normal mood and affect.  Laboratory Data: Lab Results  Component Value Date   WBC 8.9 06/14/2018   HGB 13.4 06/14/2018   HCT 40.2 06/14/2018   MCV 95.5 06/14/2018   PLT 209 06/14/2018    Lab Results  Component Value Date   CREATININE 0.88 06/14/2018    Urinalysis UA was reviewed, completely negative without evidence of WBCs or RBCs.  Epic for details.  Pertinent Imaging: Results for orders placed or performed in visit on 10/23/18  BLADDER SCAN AMB NON-IMAGING  Result Value Ref Range   Scan Result 11ml     Assessment & Plan:    1. Gross hematuria Gross hematuria in the setting of E.  coli urinary tract infection and 2 other positive  urinalysis highly suspicious for probable residual inflammatory cystitis following UTI perhaps incompletely treated infection  Today, her UA is completely negative and she is asymptomatic.  At this point in time, very low suspicion for bladder pathology.  I have advised her that if she develops any recurrent episodes of gross hematuria or UTI-like symptoms, she should return to [place] office specifically evaluation and treatment.  If she continues to have recurrent episodes or further episodes of hematuria, may consider upper tract imaging and cystoscopy.  We may also consider implementation of topical estrogen cream at that time.  - BLADDER SCAN AMB NON-IMAGING - Urinalysis, Complete  2. History of cystitis Above, suspect residual inflammatory cystitis after acute bacterial UTI in December  No evidence of ongoing cystitis today  3. Atrophic vaginitis Patient may benefit from topical estrogen cream, hold off for the time being given only 1 documented true urinary tract infection but consider if she develops recurrent episodes She is agreeable this plan  Return if with recurrent gross hematuria or UTIs.  Hollice Espy, MD  [facility] [address] [place], [place] [address] [phone]

## 2018-10-23 NOTE — Patient Instructions (Signed)
Urinary Tract Infection, Adult A urinary tract infection (UTI) is an infection of any part of the urinary tract. The urinary tract includes:  The kidneys.  The ureters.  The bladder.  The urethra. These organs make, store, and get rid of pee (urine) in the body. What are the causes? This is caused by germs (bacteria) in your genital area. These germs grow and cause swelling (inflammation) of your urinary tract. What increases the risk? You are more likely to develop this condition if:  You have a small, thin tube (catheter) to drain pee.  You cannot control when you pee or poop (incontinence).  You are female, and: ? You use these methods to prevent pregnancy: ? A medicine that kills sperm (spermicide). ? A device that blocks sperm (diaphragm). ? You have low levels of a female hormone (estrogen). ? You are pregnant.  You have genes that add to your risk.  You are sexually active.  You take antibiotic medicines.  You have trouble peeing because of: ? A prostate that is bigger than normal, if you are female. ? A blockage in the part of your body that drains pee from the bladder (urethra). ? A kidney stone. ? A nerve condition that affects your bladder (neurogenic bladder). ? Not getting enough to drink. ? Not peeing often enough.  You have other conditions, such as: ? Diabetes. ? A weak disease-fighting system (immune system). ? Sickle cell disease. ? Gout. ? Injury of the spine. What are the signs or symptoms? Symptoms of this condition include:  Needing to pee right away (urgently).  Peeing often.  Peeing small amounts often.  Pain or burning when peeing.  Blood in the pee.  Pee that smells bad or not like normal.  Trouble peeing.  Pee that is cloudy.  Fluid coming from the vagina, if you are female.  Pain in the belly or lower back. Other symptoms include:  Throwing up (vomiting).  No urge to eat.  Feeling mixed up (confused).  Being tired  and grouchy (irritable).  A fever.  Watery poop (diarrhea). How is this treated? This condition may be treated with:  Antibiotic medicine.  Other medicines.  Drinking enough water. Follow these instructions at home:  Medicines  Take over-the-counter and prescription medicines only as told by your doctor.  If you were prescribed an antibiotic medicine, take it as told by your doctor. Do not stop taking it even if you start to feel better. General instructions  Make sure you: ? Pee until your bladder is empty. ? Do not hold pee for a long time. ? Empty your bladder after sex. ? Wipe from front to back after pooping if you are a female. Use each tissue one time when you wipe.  Drink enough fluid to keep your pee pale yellow.  Keep all follow-up visits as told by your doctor. This is important. Contact a doctor if:  You do not get better after 1-2 days.  Your symptoms go away and then come back. Get help right away if:  You have very bad back pain.  You have very bad pain in your lower belly.  You have a fever.  You are sick to your stomach (nauseous).  You are throwing up. Summary  A urinary tract infection (UTI) is an infection of any part of the urinary tract.  This condition is caused by germs in your genital area.  There are many risk factors for a UTI. These include having a small, thin   tube to drain pee and not being able to control when you pee or poop.  Treatment includes antibiotic medicines for germs.  Drink enough fluid to keep your pee pale yellow. This information is not intended to replace advice given to you by your health care provider. Make sure you discuss any questions you have with your health care provider. Document Released: 01/17/2008 Document Revised: 02/07/2018 Document Reviewed: 02/07/2018 Elsevier Interactive Patient Education  2019 [employer] Inc.  

## 2018-11-01 ENCOUNTER — Telehealth: Payer: Self-pay | Admitting: Urology

## 2018-11-01 MED ORDER — SULFAMETHOXAZOLE-TRIMETHOPRIM 800-160 MG PO TABS: 1.0000 | ORAL_TABLET | Freq: Two times a day (BID) | ORAL | 0 refills | Status: DC

## 2018-11-01 NOTE — Telephone Encounter (Signed)
Normally I would ask her to come in but with current circumstances, please prescribe bactrim ds bid x 5 days.  Call if symptoms fail to resolve.  Hollice Espy, MD

## 2018-11-01 NOTE — Telephone Encounter (Signed)
RX sent to pharmacy  

## 2018-11-01 NOTE — Telephone Encounter (Signed)
Pt's husband called and states that his wife feels like she may have a UTI and also her urine in cloudy. He denies fever or nausea. Please advise.

## 2018-11-27 ENCOUNTER — Telehealth: Payer: Self-pay | Admitting: Urology

## 2018-11-27 ENCOUNTER — Other Ambulatory Visit: Payer: PPO

## 2018-11-27 ENCOUNTER — Other Ambulatory Visit: Payer: Self-pay

## 2018-11-27 DIAGNOSIS — R300 Dysuria: Secondary | ICD-10-CM

## 2018-11-27 LAB — URINALYSIS, COMPLETE
Bilirubin, UA: NEGATIVE
Glucose, UA: NEGATIVE
Nitrite, UA: NEGATIVE
Specific Gravity, UA: >1.03 — ABNORMAL HIGH (ref 1.005–1.030)
Urobilinogen, Ur: 0.2 mg/dL (ref 0.2–1.0)
pH, UA: 5 (ref 5.0–7.5)

## 2018-11-27 LAB — MICROSCOPIC EXAMINATION: WBC, UA: >30 /hpf — AB (ref 0–5)

## 2018-11-27 NOTE — Telephone Encounter (Signed)
Patient notified, came to office and dropped of UA and CX, per Dr. Erlene Quan due will wait on culture results to treat, patient was instructed to try AZO OTC and increase water intake to help with dysuria

## 2018-11-27 NOTE — Telephone Encounter (Signed)
Pt. states she thinks she has a UTI. Symptoms-Frequent Urination,Cloudy Urine, and burning. Pt. Is willing to come in if she needs to or go ahead and get a Rx called in to pharmacy she is fine with whatever Dr. Erlene Quan suggest.

## 2018-11-27 NOTE — Telephone Encounter (Signed)
We treated her on 3/20 for the same issue with Bactrim.  I amconcerned that we did not choose the appropriate antibiotics or perhaps that she has a more resistant organism.  Right, I do think that it would be prudent to have her come in and drop off a UA/urine culture.  Please see if she can come in today to do this.  ORder placed.  Hollice Espy, MD

## 2018-11-29 LAB — CULTURE, URINE COMPREHENSIVE

## 2018-12-02 ENCOUNTER — Telehealth: Payer: Self-pay | Admitting: Urology

## 2018-12-02 DIAGNOSIS — R310 Gross hematuria: Secondary | ICD-10-CM

## 2018-12-02 DIAGNOSIS — Z87440 Personal history of urinary (tract) infections: Secondary | ICD-10-CM

## 2018-12-02 NOTE — Telephone Encounter (Signed)
Yes, please let her know that the fact that she keeps having this recur without frankly positive urine cultures is somewhat concerning.  I would like to have her get a renal ultrasound and follow-up in the office with a cystoscopy for further evaluation of her ongoing and recurrent symptoms.  I placed the order for renal ultrasound, please schedule cystoscopy in about a month to complete the evaluation.  Hollice Espy, MD

## 2018-12-02 NOTE — Telephone Encounter (Signed)
Patient's husband is calling asking about her labs results from last week   Sharyn Lull

## 2018-12-02 NOTE — Telephone Encounter (Signed)
Urine cx appears to be negative, I will inform pt of this. Is there any follow up you want me to add when calling her?

## 2018-12-03 NOTE — Telephone Encounter (Signed)
App made and patient is aware ° °Kristin [name] °

## 2018-12-19 ENCOUNTER — Other Ambulatory Visit: Payer: Self-pay

## 2018-12-19 ENCOUNTER — Encounter: Payer: Self-pay | Admitting: Podiatry

## 2018-12-19 ENCOUNTER — Ambulatory Visit: Payer: PPO | Admitting: Podiatry

## 2018-12-19 VITALS — Temp 98.6°F

## 2018-12-19 DIAGNOSIS — M79676 Pain in unspecified toe(s): Secondary | ICD-10-CM | POA: Diagnosis not present

## 2018-12-19 DIAGNOSIS — M2041 Other hammer toe(s) (acquired), right foot: Secondary | ICD-10-CM

## 2018-12-19 DIAGNOSIS — M2042 Other hammer toe(s) (acquired), left foot: Secondary | ICD-10-CM

## 2018-12-19 DIAGNOSIS — L84 Corns and callosities: Secondary | ICD-10-CM

## 2018-12-19 DIAGNOSIS — M79609 Pain in unspecified limb: Principal | ICD-10-CM

## 2018-12-19 DIAGNOSIS — B351 Tinea unguium: Secondary | ICD-10-CM | POA: Diagnosis not present

## 2018-12-19 DIAGNOSIS — M205X9 Other deformities of toe(s) (acquired), unspecified foot: Secondary | ICD-10-CM | POA: Diagnosis not present

## 2018-12-19 NOTE — Progress Notes (Signed)
This patient presents the office with chief complaint of a painful callus on the inside of her second toe left foot.  She says this area has become painful for the last few weeks.  She says she has been using cotton balls between her toes and other separating tools but the problem persists.  She presents the office today wearing a plastic bandage separating the first and second toes left foot.  Patient has a history of RA in her hands.  She presents the office today for an evaluation of this painful corn second toe left foot.  Vascular  Dorsalis pedis and posterior tibial pulses are palpable  B/L.  Capillary return  WNL.  Temperature gradient is  WNL.  Skin turgor  WNL  Sensorium  Senn Weinstein monofilament wire  WNL. Normal tactile sensation.  Nail Exam  Patient has normal nails with no evidence of bacterial or fungal infection.  Orthopedic  Exam  Muscle tone and muscle strength  WNL.  No limitations of motion feet  B/L.  No crepitus or joint effusion noted.  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  Skin  No open lesions.  Normal skin texture and turgor.  Corn noted medially DIPJ second toe left foot  Hammer toe second.  HAV  B/L.  Corn second toe left foot   Porokeratosis sib 1 left foot.  Debride nails  X 10.  Debride porokeratosis.  Padding dispensed.  RTC 3 months   Gardiner Barefoot DPM

## 2019-01-14 ENCOUNTER — Other Ambulatory Visit: Payer: PPO | Admitting: Urology

## 2019-01-30 ENCOUNTER — Other Ambulatory Visit: Payer: Self-pay

## 2019-01-30 ENCOUNTER — Ambulatory Visit
Admission: RE | Admit: 2019-01-30 | Discharge: 2019-01-30 | Disposition: A | Discharge status: Home / Self Care | Payer: PPO | Source: Ambulatory Visit | Attending: Urology | Admitting: Urology

## 2019-01-30 DIAGNOSIS — Z87440 Personal history of urinary (tract) infections: Secondary | ICD-10-CM | POA: Diagnosis not present

## 2019-01-30 DIAGNOSIS — R310 Gross hematuria: Secondary | ICD-10-CM | POA: Diagnosis not present

## 2019-01-30 DIAGNOSIS — R3121 Asymptomatic microscopic hematuria: Secondary | ICD-10-CM | POA: Diagnosis not present

## 2019-02-04 ENCOUNTER — Encounter: Payer: Self-pay | Admitting: Urology

## 2019-02-04 ENCOUNTER — Other Ambulatory Visit: Payer: Self-pay

## 2019-02-04 ENCOUNTER — Ambulatory Visit: Payer: PPO | Admitting: Urology

## 2019-02-04 VITALS — BP 148/73 | HR 70 | Ht 67.0 in | Wt 139.0 lb

## 2019-02-04 DIAGNOSIS — Z87440 Personal history of urinary (tract) infections: Secondary | ICD-10-CM

## 2019-02-04 DIAGNOSIS — R310 Gross hematuria: Secondary | ICD-10-CM

## 2019-02-04 DIAGNOSIS — N952 Postmenopausal atrophic vaginitis: Secondary | ICD-10-CM

## 2019-02-04 MED ORDER — PREMARIN 0.625 MG/GM VA CREA: 1.0000 | TOPICAL_CREAM | Freq: Every day | VAGINAL | 12 refills | Status: DC

## 2019-02-04 MED ORDER — TRIMETHOPRIM 100 MG PO TABS: 100.0000 mg | ORAL_TABLET | Freq: Every day | ORAL | 0 refills | Status: DC

## 2019-02-04 NOTE — Progress Notes (Signed)
   02/05/19  CC:  Chief Complaint  Patient presents with  . Cysto    HPI: 78 year old female with a personal history of gross hematuria and ongoing urinary symptoms including dysuria who presents today for further evaluation with cystoscopy.  As part of this evaluation, she underwent renal ultrasound on 01/30/2019 which is personally reviewed.  This showed mild bilateral age-related renal cortical thinning.  She also was noted to have 2 hepatic cyst seen on previous imaging.  No evidence of kidney stones or renal masses.   Last menstrual period 08/14/1989. Today's Vitals   02/04/19 1436  BP: (!) 148/73  Pulse: 70  Weight: 139 lb (63 kg)  Height: 5\' 7"  (1.702 m)   Body mass index is 21.77 kg/m. NED. A&Ox3.   No respiratory distress   Abd soft, NT, ND Normal external genitalia with patent urethral meatus.  Vaginal atrophy was appreciated along with atrophy around her periurethral area.  Cystoscopy Procedure Note  Patient identification was confirmed, informed consent was obtained, and patient was prepped using Betadine solution.  Lidocaine jelly was administered per urethral meatus.    Procedure: - Flexible cystoscope introduced, without any difficulty.   - Thorough search of the bladder revealed:    normal urethral meatus    Significant debris throughout the bladder initially limiting visualization, aspirated several times and irrigated until more clear.  Bladder appeared to be grossly inflamed with marked diffuse nonspecific erythema throughout.    no stones    no ulcers     no tumors    no urethral polyps    Mild regulation  - Ureteral orifices were normal in position and appearance.  Post-Procedure: - Patient tolerated the procedure well  Assessment/ Plan:  1. Gross hematuria Likely secondary to cystitis No obvious bladder tumor or renal masses - Urinalysis, Complete  2. 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 Atrophic vaginitis Patient would benefit from topical estrogen cream, small pea-sized amount Monday Wednesday Friday Discussed this extensively patient and her husband today and they are agreeable this plan   Return for shannon 3 months for UA (possibly cathed specimen if smyptomatic).  Hollice Espy, MD

## 2019-02-05 ENCOUNTER — Encounter: Payer: Self-pay | Admitting: Urology

## 2019-02-05 LAB — MICROSCOPIC EXAMINATION: WBC, UA: >30 /hpf — AB (ref 0–5)

## 2019-02-05 LAB — URINALYSIS, COMPLETE
Bilirubin, UA: NEGATIVE
Glucose, UA: NEGATIVE
Ketones, UA: NEGATIVE
Nitrite, UA: NEGATIVE
Protein,UA: NEGATIVE
Specific Gravity, UA: <1.005 — ABNORMAL LOW (ref 1.005–1.030)
Urobilinogen, Ur: 0.2 mg/dL (ref 0.2–1.0)
pH, UA: 6.5 (ref 5.0–7.5)

## 2019-02-05 NOTE — Addendum Note (Signed)
Addended by: Hollice Espy on: 02/05/2019 08:21 AM   Modules accepted: Level of Service

## 2019-02-06 LAB — CULTURE, URINE COMPREHENSIVE

## 2019-02-07 ENCOUNTER — Other Ambulatory Visit: Payer: Self-pay | Admitting: Urology

## 2019-02-07 ENCOUNTER — Telehealth: Payer: Self-pay | Admitting: *Deleted

## 2019-02-07 MED ORDER — SULFAMETHOXAZOLE-TRIMETHOPRIM 800-160 MG PO TABS: 1.0000 | ORAL_TABLET | Freq: Two times a day (BID) | ORAL | 0 refills | Status: DC

## 2019-02-07 NOTE — Telephone Encounter (Addendum)
Patient informed-verbalized understanding. Sent in Bactrim, patient is aware.    ----- Message from Hollice Espy, MD sent at 02/07/2019  1:29 PM EDT ----- Please call and treat her with Bactrim DS twice daily for 7 days and then she should resume suppression.  Hollice Espy, MD

## 2019-02-17 ENCOUNTER — Ambulatory Visit: Payer: PPO | Admitting: Nurse Practitioner

## 2019-03-04 ENCOUNTER — Encounter: Payer: Self-pay | Admitting: Nurse Practitioner

## 2019-03-04 ENCOUNTER — Other Ambulatory Visit: Payer: Self-pay

## 2019-03-04 ENCOUNTER — Ambulatory Visit (INDEPENDENT_AMBULATORY_CARE_PROVIDER_SITE_OTHER): Payer: PPO | Admitting: Nurse Practitioner

## 2019-03-04 DIAGNOSIS — M0579 Rheumatoid arthritis with rheumatoid factor of multiple sites without organ or systems involvement: Secondary | ICD-10-CM | POA: Diagnosis not present

## 2019-03-04 DIAGNOSIS — M8949 Other hypertrophic osteoarthropathy, multiple sites: Secondary | ICD-10-CM

## 2019-03-04 DIAGNOSIS — M150 Primary generalized (osteo)arthritis: Secondary | ICD-10-CM

## 2019-03-04 DIAGNOSIS — M159 Polyosteoarthritis, unspecified: Secondary | ICD-10-CM

## 2019-03-04 DIAGNOSIS — R413 Other amnesia: Secondary | ICD-10-CM

## 2019-03-04 DIAGNOSIS — R42 Dizziness and giddiness: Secondary | ICD-10-CM

## 2019-03-04 NOTE — Progress Notes (Signed)
Telemedicine Encounter: Disclosed to patient at start of encounter that we will provide appropriate telemedicine services.  Patient consents to be treated via phone prior to discussion. - Patient is at her home and is accessed via telephone. - Services are provided by Cassell Smiles from [facility].  Subjective:    Patient ID: Kristin [name], female    DOB: July 24, 1941, 78 y.o.   MRN: [id number]  Kristin [name] is a 78 y.o. female presenting on 03/04/2019 for Dizziness (chronic pain that the patient associate with Rhematoid Arthritis, intermittent dizziness )  Patient is accompanied on phone today by her husband, who is primary caregiver.  He also helps to provide history as patient is poor historian due to memory loss.  HPI  Dizziness Dizziness remains intermittently.  Husband notes is better than in past.  Patient notes symptoms are tolerable.  Patient denies sense of falling.  Takes her time with movements to prevent this.  Walking too fast and turning too fast causes more dizziness.   - Patient did start vestibular therapy.  Patient didn't finish home exercises, but otherwise was helpful.  Appointments were cancelled due to Covid.  Patient completed about 5 visits.  Patient will be contacted by therapy.  Chronic pain Continues chronic pain with association to RA.  Patient has polyarthralgias due to RA.  Tramadol is prn, chronic med.  Patient would take twice daily if needed, but usually only takes one tablet 2-3 times per week.  She gets much more disoriented and   Dysuria Patient continues having urinary pain, continues to take AZO x 5-6 weeks.  Takes 2-3 times daily. She has recently seen Dr. Erlene Quan, urology for ongoing workup and was started on chronic trimethoprim.  Social History   Tobacco Use  . Smoking status: Never Smoker  . Smokeless tobacco: Never Used  Substance Use Topics  . Alcohol use: No    Alcohol/week: 0.0 standard drinks    Comment: Rare  .  Drug use: No   Review of Systems Per HPI unless specifically indicated above.    Objective:    LMP 08/14/1989   Wt Readings from Last 3 Encounters:  02/04/19 139 lb (63 kg)  10/23/18 142 lb (64.4 kg)  09/25/18 144 lb 9.6 oz (65.6 kg)    Physical Exam Patient remotely monitored.  Verbal communication appropriate.  Memory impaired (short and long-term).   Results for orders placed or performed in visit on 02/04/19  CULTURE, URINE COMPREHENSIVE   Specimen: Urine   CU  Result Value Ref Range   Urine Culture, Comprehensive Final report (A)    Organism ID, Bacteria Escherichia coli (A)    ANTIMICROBIAL SUSCEPTIBILITY Comment   Microscopic Examination   URINE  Result Value Ref Range   WBC, UA >30 (A) 0 - 5 /hpf   RBC 3-10 (A) 0 - 2 /hpf   Epithelial Cells (non renal) 0-10 0 - 10 /hpf   Renal Epithel, UA 0-10 (A) None seen /hpf   Bacteria, UA Moderate (A) None seen/Few  Urinalysis, Complete  Result Value Ref Range   Specific Gravity, UA <1.005 (L) 1.005 - 1.030   pH, UA 6.5 5.0 - 7.5   Color, UA Yellow Yellow   Appearance Ur Cloudy (A) Clear   Leukocytes,UA 2+ (A) Negative   Protein,UA Negative Negative/Trace   Glucose, UA Negative Negative   Ketones, UA Negative Negative   RBC, UA 1+ (A) Negative   Bilirubin, UA Negative Negative   Urobilinogen, Ur  0.2 0.2 - 1.0 mg/dL   Nitrite, UA Negative Negative   Microscopic Examination See below:       Assessment & Plan:   Problem List Items Addressed This Visit      Musculoskeletal and Integument   Rheumatoid arthritis ([place]) Patient with chronic pain as primary symptom of RA (polymyalgias).  Pain often complicates memory impairment/moods.  Tramadol prescription allows patient sufficient pain relief. - Refill Tramadol #45 tabs per fill, one refill provided - Consider resuming care with rheumatology if needed. - Follow-up 3 months.     Other   Dizziness Stable to mildly improved.  Balance improving some with less fear of  falling.   - Encouraged patient to resume vestibular therapy if offered. - Continue home exercises in interim - Follow-up prn.    Short-term memory loss - Primary Uncontrolled.  Patient with very poor memory.  Continues to perform ADLs but is unable to complete iADLs.  Primary caregiver is her husband.  Continue regular activity, reading/puzzles, etc.   - Follow-up with neurology ongoing.      Meds ordered this encounter  Medications  . traMADol (ULTRAM) 50 MG tablet    Sig: Take 1 tablet (50 mg total) by mouth every 12 (twelve) hours as needed for moderate pain.    Dispense:  45 tablet    Refill:  1    Order Specific Question:   Supervising Provider    Answer:   Olin Hauser [[id number]]   - Time spent in direct consultation with patient via telemedicine about above concerns: 11 minutes  Follow up plan: Return in about 3 months (around 06/04/2019) for hypertension and labs.  Cassell Smiles, DNP, AGPCNP-BC Adult Gerontology Primary Care Nurse Practitioner [facility] 03/04/2019, 10:16 AM

## 2019-03-05 ENCOUNTER — Encounter: Payer: Self-pay | Admitting: Nurse Practitioner

## 2019-03-05 ENCOUNTER — Ambulatory Visit: Payer: PPO

## 2019-03-05 MED ORDER — TRAMADOL HCL 50 MG PO TABS: 50.0000 mg | ORAL_TABLET | Freq: Two times a day (BID) | ORAL | 1 refills | Status: DC | PRN

## 2019-03-06 ENCOUNTER — Encounter: Payer: PPO | Admitting: Family Medicine

## 2019-03-07 ENCOUNTER — Encounter: Payer: PPO | Admitting: Family Medicine

## 2019-03-10 ENCOUNTER — Telehealth: Payer: Self-pay | Admitting: Nurse Practitioner

## 2019-03-10 NOTE — Telephone Encounter (Signed)
Called to schedule Medicare Annual Wellness Visit with Nurse Health Advisor.   If patient returns call, please schedule AWV with NHA ~any date  For any questions please contact:  Jill Alexanders [phone] or skype at: [email or web address]

## 2019-03-11 ENCOUNTER — Other Ambulatory Visit: Payer: Self-pay | Admitting: Nurse Practitioner

## 2019-03-11 DIAGNOSIS — Z1231 Encounter for screening mammogram for malignant neoplasm of breast: Secondary | ICD-10-CM

## 2019-03-20 ENCOUNTER — Other Ambulatory Visit: Payer: Self-pay

## 2019-03-20 ENCOUNTER — Ambulatory Visit: Payer: PPO | Admitting: Podiatry

## 2019-03-20 ENCOUNTER — Encounter: Payer: Self-pay | Admitting: Podiatry

## 2019-03-20 VITALS — Temp 98.7°F

## 2019-03-20 DIAGNOSIS — L84 Corns and callosities: Secondary | ICD-10-CM | POA: Diagnosis not present

## 2019-03-20 DIAGNOSIS — B351 Tinea unguium: Secondary | ICD-10-CM

## 2019-03-20 DIAGNOSIS — M79676 Pain in unspecified toe(s): Secondary | ICD-10-CM | POA: Diagnosis not present

## 2019-03-20 NOTE — Progress Notes (Signed)
Complaint:  Visit Type: Patient returns to my office for continued preventative foot care services. Complaint: Patient states" my nails have grown long and thick and become painful to walk and wear shoes" . The patient presents for preventative foot care services. No changes to ROS. Patient has history of RA.  Podiatric Exam: Vascular: dorsalis pedis and posterior tibial pulses are palpable bilateral. Capillary return is immediate. Temperature gradient is WNL. Skin turgor WNL  Sensorium: Normal Semmes Weinstein monofilament test. Normal tactile sensation bilaterally. Nail Exam: Pt has thick disfigured discolored nails with subungual debris noted bilateral entire nail hallux through fifth toenails Ulcer Exam: There is no evidence of ulcer or pre-ulcerative changes or infection. Orthopedic Exam: Muscle tone and strength are WNL. No limitations in general ROM. No crepitus or effusions noted. Hallux limitus 1st MPJ  B/L.  Hammer toe second  B/L Skin: No Porokeratosis. No infection or ulcers.  Porokeratosis sub 1 left foot.  Diagnosis:  Onychomycosis, , Pain in right toe, pain in left toes  Porokeratosis Treatment & Plan Procedures and Treatment: Consent by patient was obtained for treatment procedures. The patient understood the discussion of treatment and procedures well. All questions were answered thoroughly reviewed. Debridement of mycotic and hypertrophic toenails, 1 through 5 bilateral and clearing of subungual debris. No ulceration, no infection noted. Debride porokeratosis sub 1 left foot. Return Visit-Office Procedure: Patient instructed to return to the office for a follow up visit 3 months for continued evaluation and treatment.    Gardiner Barefoot DPM

## 2019-04-14 ENCOUNTER — Ambulatory Visit
Admission: RE | Admit: 2019-04-14 | Discharge: 2019-04-14 | Disposition: A | Discharge status: Home / Self Care | Payer: PPO | Source: Ambulatory Visit | Attending: Nurse Practitioner | Admitting: Nurse Practitioner

## 2019-04-14 DIAGNOSIS — Z1231 Encounter for screening mammogram for malignant neoplasm of breast: Secondary | ICD-10-CM | POA: Diagnosis not present

## 2019-04-14 DIAGNOSIS — H43813 Vitreous degeneration, bilateral: Secondary | ICD-10-CM | POA: Diagnosis not present

## 2019-05-15 NOTE — Progress Notes (Signed)
05/16/2019 9:58 AM   Kristin [name] 09/11/1940 [id number]  Referring provider: Mikey College, NP [place],  [place] [address]  Chief Complaint  Patient presents with  . Urinary Frequency    HPI: [name] is a 78 year old female with a history of hematuria, vaginal atrophy and a history of rUTI's who presents today for follow up.  History of hematuria (high risk) Non smoker.  RUS in 11/2018 revealed Mild BILATERAL age-related renal cortical thinning.  No evidence of renal mass or hydronephrosis.  Cystoscopy in 01/2019 with Dr. Erlene Quan revealed significant debris throughout the bladder initially limiting visualization, aspirated several times and irrigated until more clear.  Bladder appeared to be grossly inflamed with marked diffuse nonspecific erythema throughout.  Urine cytology was negative.  She does not report any gross hematuria.  Her cath UA today is positive for 3-10 RBC's, but nitrite positive.     Vaginal atrophy Negative mammogram in 03/2019.  Vaginal LAVH-BSO.  She is using the vaginal estrogen cream when she remembers.    rUTI's Risk factors:  Age and vaginal atrophy.  She is having frequency x uncertain of daytime numbers, intermittent dysuria and nocturia x 3-4.  Patient denies any gross hematuria or suprapubic/flank pain.  Patient denies any fevers, chills, nausea or vomiting.   UA today is nitrite positive, >30 WBC's, 3-10 RBC's and many bacteria.  Her PVR was 80 mL.    Nocturia She is getting up three to four times of night.  She has been suffering with lower extremity edema bilaterally for the last four months.    PMH: Past Medical History:  Diagnosis Date  . Allergy   . Arthritis    recent falls -aug 2010  . [name] (coronary artery disease)    mi-1996  . Complication of anesthesia    pt states takes a long time to wake her wake up  . 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 ([place])  08/27/2015  . Cough    no fever   . Degeneration of lumbar or lumbosacral intervertebral disc   . Dizziness    r/t meds  . DVT (deep venous thrombosis) ([place])   . Fibromyalgia   . History of shingles   . [name] (irritable bowel syndrome)    takes OTC [place] Negus colon health  . Joint pain   . Joint swelling   . Myalgia and myositis, unspecified   . Obstructive sleep apnea (adult) (pediatric)   . Old myocardial infarction 1996  . Osteoarthritis   . Osteoarthrosis, unspecified whether generalized or localized, unspecified site   . Psoriatic arthritis ([place])   . Rheumatoid arthritis ([place])   . Rosacea   . Seasonal allergies   . Stroke [facility])    ischaemic microvascular disease  . 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 Unspecified essential hypertension    takes Diltiazem,Metoprolol,and Losartan daily  . UTI (lower urinary tract infection)    frequent=last one was winter of 2014    Surgical History: Past Surgical History:  Procedure Laterality Date  . ATHERECTOMY  1996  . [place]   vasospams; no blockage  . CARDIAC CATHETERIZATION  2013   No obstructive coronary artery disease. Mild distal LAD and proximal RCA disease.  [name] CATARACT EXTRACTION Bilateral   . COLONOSCOPY    . CT ABD WO/W & PELVIS WO CM  2013  no acute process, + minimal diverticulosis and hepatic/renal cysts, ATH calcifications  . ETHMOIDECTOMY  1998  . 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 NASAL SINUS SURGERY  1998  . OOPHORECTOMY     BSO  . sinus biopsy  1998   inverted papiloma  . VAGINAL HYSTERECTOMY  1992   for mennorhagia-LAVH BSO    Home Medications:  Allergies as of 05/16/2019      Reactions   Angiotensin Receptor Blockers Anaphylaxis   Fatigue & Dizziness   Other Anaphylaxis    Other reaction(s): Other (See Comments) Dizziness, rash Fatigue & Dizziness   Hydralazine Other (See Comments)   Unknown   Latex    REACTION: Rash, blisters, breathing probs.   Losartan    Dizziness   Methotrexate Derivatives    Dizziness, rash   Spironolactone    Dizziness   Penicillins Hives, Rash      Medication List       Accurate as of May 16, 2019  9:58 AM. If you have any questions, ask your nurse or doctor.        acetaminophen 325 MG tablet Commonly known as: TYLENOL Take 325 mg by mouth every 8 (eight) hours as needed (pain).   ASPIRIN 81 PO Take 1 tablet by mouth daily.   CALTRATE 600+D PO Take 1 tablet by mouth 2 (two) times daily.   CoQ-10 200 MG Caps Take by mouth.   diltiazem 240 MG 24 hr capsule Commonly known as: CARDIZEM CD TAKE 1 CAPSULE EVERY DAY   DOXYLAMINE SUCCINATE PO Take 25 mg by mouth at bedtime.   Fish Oil 1200 MG Caps Take 1 capsule by mouth 2 (two) times daily.   MOTRIN PO Take 1 tablet by mouth at bedtime.   multivitamin tablet Take 1 tablet by mouth daily.   nitroGLYCERIN 0.4 MG SL tablet Commonly known as: Nitrostat Place 1 tablet (0.4 mg total) every 5 (five) minutes as needed under the tongue for chest pain.   NON FORMULARY 3,000 mg. CBD Tincture   [facility] Colon Health Caps Take 1 capsule by mouth daily.   Premarin vaginal cream Generic drug: conjugated estrogens Place 1 Applicatorful vaginally daily. Use pea sized amount M-W-Fr before bedtime   Systane 0.4-0.3 % Soln Generic drug: Polyethyl Glycol-Propyl Glycol Apply 1 drop to eye daily as needed (dry eyes).   traMADol 50 MG tablet Commonly known as: ULTRAM Take 1 tablet (50 mg total) by mouth every 12 (twelve) hours as needed for moderate pain.   triamcinolone cream 0.1 % Commonly known as: KENALOG   trimethoprim 100 MG tablet Commonly known as: TRIMPEX Take 1 tablet (100 mg total) by mouth daily.   Turmeric 500 MG Caps Take by mouth.    vitamin B-12 1000 MCG tablet Commonly known as: CYANOCOBALAMIN Take 1,000 mcg by mouth daily.       Allergies:  Allergies  Allergen Reactions  . Angiotensin Receptor Blockers Anaphylaxis    Fatigue & Dizziness  . Other Anaphylaxis    Other reaction(s): Other (See Comments) Dizziness, rash Fatigue & Dizziness  . Hydralazine Other (See Comments)    Unknown  . Latex     REACTION: Rash, blisters, breathing probs.  . Losartan     Dizziness   . Methotrexate Derivatives     Dizziness, rash   . Spironolactone  Dizziness   . Penicillins Hives and Rash    Family History: Family History  Problem Relation Age of Onset  . Colon cancer Father   . Heart attack Mother   . Coronary artery disease Mother   . Osteoporosis Mother   . Heart disease Mother   . Heart failure Mother   . GER disease Sister   . Skin cancer Sister   . Hypertension Sister   . Osteopenia Sister   . GER disease Sister   . Breast cancer Paternal Aunt        age 72  . Heart failure Maternal Grandmother   . Heart failure Maternal Grandfather     Social History:  reports that she has never smoked. She has never used smokeless tobacco. She reports that she does not drink alcohol or use drugs.  ROS: UROLOGY Frequent Urination?: Yes Hard to postpone urination?: No Burning/pain with urination?: Yes Get up at night to urinate?: Yes Leakage of urine?: No Urine stream starts and stops?: No Trouble starting stream?: No Do you have to strain to urinate?: No Blood in urine?: No Urinary tract infection?: No Sexually transmitted disease?: No Injury to kidneys or bladder?: No Painful intercourse?: No Weak stream?: No Currently pregnant?: No Vaginal bleeding?: No Last menstrual period?: n  Gastrointestinal Nausea?: No Vomiting?: No Indigestion/heartburn?: No Diarrhea?: No Constipation?: No  Constitutional Fever: No Night sweats?: No Weight loss?: No Fatigue?: Yes  Skin Skin rash/lesions?:  No Itching?: No  Eyes Blurred vision?: No Double vision?: No  Ears/Nose/Throat Sore throat?: No Sinus problems?: Yes  Hematologic/Lymphatic Swollen glands?: No Easy bruising?: No  Cardiovascular Leg swelling?: Yes Chest pain?: No  Respiratory Cough?: No Shortness of breath?: No  Endocrine Excessive thirst?: No  Musculoskeletal Back pain?: Yes Joint pain?: Yes  Neurological Headaches?: No Dizziness?: Yes  Psychologic Depression?: No Anxiety?: No  Physical Exam: BP (!) 149/78 (BP Location: Left Arm, Patient Position: Sitting, Cuff Size: Normal)   Pulse 89   Ht 5\' 7"  (1.702 m)   Wt 137 lb 11.2 oz (62.5 kg)   LMP 08/14/1989   BMI 21.57 kg/m   Constitutional:  Well nourished. Alert and oriented, No acute distress. HEENT: [place] AT, moist mucus membranes.  Trachea midline, no masses. Cardiovascular: No clubbing or cyanosis.   Lower extremity edema, bilaterally.  Respiratory: Normal respiratory effort, no increased work of breathing. Neurologic: Grossly intact, no focal deficits, moving all 4 extremities.   Psychiatric: Normal mood and affect.   Laboratory Data: Lab Results  Component Value Date   WBC 8.9 06/14/2018   HGB 13.4 06/14/2018   HCT 40.2 06/14/2018   MCV 95.5 06/14/2018   PLT 209 06/14/2018    Lab Results  Component Value Date   CREATININE 0.88 06/14/2018    No results found for: PSA  No results found for: TESTOSTERONE  No results found for: HGBA1C  Lab Results  Component Value Date   TSH 2.44 02/19/2017       Component Value Date/Time   CHOL 229 (H) 02/21/2018 1035   HDL 73.20 02/21/2018 1035   CHOLHDL 3 02/21/2018 1035   VLDL 21.2 02/21/2018 1035   LDLCALC 134 (H) 02/21/2018 1035    Lab Results  Component Value Date   AST 19 02/21/2018   Lab Results  Component Value Date   ALT 15 02/21/2018   No components found for: ALKALINEPHOPHATASE No components found for: BILIRUBINTOTAL  No results found for: ESTRADIOL   Urinalysis Component     Latest  Ref Rng & Units 05/16/2019  Specific Gravity, UA     1.005 - 1.030 1.020  pH, UA     5.0 - 7.5 7.0  Color, UA     Yellow Yellow  Appearance Ur     Clear Cloudy (A)  Leukocytes,UA     Negative 1+ (A)  Protein,UA     Negative/Trace Negative  Glucose, UA     Negative Negative  Ketones, UA     Negative Negative  RBC, UA     Negative 1+ (A)  Bilirubin, UA     Negative Negative  Urobilinogen, Ur     0.2 - 1.0 mg/dL 0.2  Nitrite, UA     Negative Positive (A)  Microscopic Examination      See below:   Component     Latest Ref Rng & Units 05/16/2019  WBC, UA     0 - 5 /hpf >30 (A)  RBC     0 - 2 /hpf 3-10 (A)  Epithelial Cells (non renal)     0 - 10 /hpf None seen  Crystals     N/A Present (A)  Crystal Type     N/A Amorphous Sediment  Bacteria, UA     None seen/Few Many (A)    I have reviewed the labs.   Assessment & Plan:    1. History of hematuria Hematuria work up completed in 01/2019 - findings positive for chronic cystitis No report of gross hematuria  UA today with 3-10 RBC's - but suspicious for infection - will send for culture and repeat urine to ensure microhematuria clears RTC in one year for UA - patient to report any gross hematuria in the interim    2. Atrophic vaginitis Having a difficult time remembering to use the cream Will leave it on the sink to remind her to apply three nights weekly  3. History of cystitis UA is suspicious for infection - will send for culture - will prescribe antibiotic once culture results are received   4. Nocturia Likely due to pedal edema  Encouraged patient to schedule appointments with PCP/Cardiology prior to starting an exercise program  Return for pending urine culture results .  These notes generated with voice recognition software. I apologize for typographical errors.  Zara Council, PA-C  [facility] [address]  [place],  [place] [address] [phone]

## 2019-05-16 ENCOUNTER — Encounter: Payer: Self-pay | Admitting: Urology

## 2019-05-16 ENCOUNTER — Ambulatory Visit: Payer: PPO | Admitting: Urology

## 2019-05-16 ENCOUNTER — Other Ambulatory Visit: Payer: Self-pay

## 2019-05-16 VITALS — BP 149/78 | HR 89 | Ht 67.0 in | Wt 137.7 lb

## 2019-05-16 DIAGNOSIS — Z87440 Personal history of urinary (tract) infections: Secondary | ICD-10-CM

## 2019-05-16 DIAGNOSIS — R3129 Other microscopic hematuria: Secondary | ICD-10-CM | POA: Diagnosis not present

## 2019-05-16 DIAGNOSIS — R351 Nocturia: Secondary | ICD-10-CM | POA: Diagnosis not present

## 2019-05-16 DIAGNOSIS — Z87448 Personal history of other diseases of urinary system: Secondary | ICD-10-CM

## 2019-05-16 DIAGNOSIS — N952 Postmenopausal atrophic vaginitis: Secondary | ICD-10-CM

## 2019-05-16 LAB — URINALYSIS, COMPLETE
Bilirubin, UA: NEGATIVE
Glucose, UA: NEGATIVE
Ketones, UA: NEGATIVE
Nitrite, UA: POSITIVE — AB
Protein,UA: NEGATIVE
Specific Gravity, UA: 1.02 (ref 1.005–1.030)
Urobilinogen, Ur: 0.2 mg/dL (ref 0.2–1.0)
pH, UA: 7 (ref 5.0–7.5)

## 2019-05-16 LAB — MICROSCOPIC EXAMINATION
Epithelial Cells (non renal): NONE SEEN /hpf (ref 0–10)
WBC, UA: >30 /hpf — AB (ref 0–5)

## 2019-05-20 ENCOUNTER — Telehealth: Payer: Self-pay | Admitting: Family Medicine

## 2019-05-20 LAB — CULTURE, URINE COMPREHENSIVE

## 2019-05-20 MED ORDER — NITROFURANTOIN MONOHYD MACRO 100 MG PO CAPS: 100.0000 mg | ORAL_CAPSULE | Freq: Two times a day (BID) | ORAL | 0 refills | Status: DC

## 2019-05-20 NOTE — Telephone Encounter (Signed)
Patient notified and voiced understanding. ABX sent to pharmacy.  

## 2019-05-20 NOTE — Telephone Encounter (Signed)
-----   Message from Nori Riis, PA-C sent at 05/20/2019  9:57 AM EDT ----- Please let Mrs. Occhipinti know that her urine culture was positive and we need to start an antibiotic for her Macrobid 100 mg, BID x 7 days.  I will also need to recheck her urine in three weeks to make sure the microscopic hematuria clears up with the antibiotic.

## 2019-05-22 ENCOUNTER — Encounter: Payer: Self-pay | Admitting: Gynecology

## 2019-06-02 ENCOUNTER — Other Ambulatory Visit: Payer: Self-pay | Admitting: Nurse Practitioner

## 2019-06-02 DIAGNOSIS — M159 Polyosteoarthritis, unspecified: Secondary | ICD-10-CM

## 2019-06-02 DIAGNOSIS — M0579 Rheumatoid arthritis with rheumatoid factor of multiple sites without organ or systems involvement: Secondary | ICD-10-CM

## 2019-06-03 ENCOUNTER — Encounter: Payer: Self-pay | Admitting: Nurse Practitioner

## 2019-06-03 ENCOUNTER — Other Ambulatory Visit: Payer: Self-pay

## 2019-06-03 ENCOUNTER — Ambulatory Visit (INDEPENDENT_AMBULATORY_CARE_PROVIDER_SITE_OTHER): Payer: PPO | Admitting: Nurse Practitioner

## 2019-06-03 VITALS — BP 132/53 | HR 69 | Ht 67.0 in | Wt 135.4 lb

## 2019-06-03 DIAGNOSIS — R413 Other amnesia: Secondary | ICD-10-CM | POA: Diagnosis not present

## 2019-06-03 DIAGNOSIS — I25111 Atherosclerotic heart disease of native coronary artery with angina pectoris with documented spasm: Secondary | ICD-10-CM | POA: Diagnosis not present

## 2019-06-03 DIAGNOSIS — E782 Mixed hyperlipidemia: Secondary | ICD-10-CM

## 2019-06-03 DIAGNOSIS — M5137 Other intervertebral disc degeneration, lumbosacral region: Secondary | ICD-10-CM

## 2019-06-03 DIAGNOSIS — Z23 Encounter for immunization: Secondary | ICD-10-CM | POA: Diagnosis not present

## 2019-06-03 DIAGNOSIS — I10 Essential (primary) hypertension: Secondary | ICD-10-CM

## 2019-06-03 DIAGNOSIS — M51379 Other intervertebral disc degeneration, lumbosacral region without mention of lumbar back pain or lower extremity pain: Secondary | ICD-10-CM

## 2019-06-03 MED ORDER — DONEPEZIL HCL 5 MG PO TABS: 5.0000 mg | ORAL_TABLET | Freq: Every day | ORAL | 1 refills | Status: DC

## 2019-06-03 NOTE — Patient Instructions (Addendum)
Kristin [name],   Thank you for coming in to clinic today.  1. Continue all current medications.  2. START new memory medication - Take donepezil 5 mg once daily.    3. For bladder: Sit down, try to pee.  Then Stand up, sit down, again to pee. See if this helps with urination in night.  Please schedule a follow-up appointment. Return in about 3 months (around 09/03/2019) for dementia, hypertension.  If you have any other questions or concerns, please feel free to call the clinic or send a message through [place]. You may also schedule an earlier appointment if necessary.  You will receive a survey after today's visit either digitally by e-mail or paper by [employer]. Your experiences and feedback matter to [place].  Please respond so we know how we are doing as we provide care for you.  Cassell Smiles, DNP, AGNP-BC Adult Gerontology Nurse Practitioner [place]

## 2019-06-03 NOTE — Progress Notes (Signed)
Subjective:    Patient ID: Kristin [name], female    DOB: Oct 21, 1940, 78 y.o.   MRN: [id number]  Kristin [name] is a 78 y.o. female presenting on 06/03/2019 for Hypertension  HPI Hypertension - She is not checking BP at home or outside of clinic.    - Current medications: Cardizem 240 mg once daily, tolerating well without side effects - She is not currently symptomatic. - Pt denies headache, lightheadedness, dizziness, changes in vision, chest tightness/pressure, palpitations, leg swelling, sudden loss of speech or loss of consciousness. - She  reports no regular exercise routine. - Her diet is moderate in salt, moderate in fat, and moderate in carbohydrates.   Hyperlipidemia, [name] Patient denies any chest pain.  - Is not on statin as she has declined this in past, low risk on last risk evaluation. - Pt denies changes in vision, chest tightness/pressure, palpitations, shortness of breath, leg pain while walking, leg or arm weakness, and sudden loss of speech or loss of consciousness.   Chronic pain/DDD Lumbar Spine Most in LEFT buttock.  Pain prevents physical activity and occasionally sleep. Continues tramadol q hs prn moderate pain.  Resolves pain fairly well.  Patient also continues acupuncture therapy which she reports remains helpful.  Memory loss Memory loss is progressing.  Patient continues to be able to assist with cooking, cleaning, laundry.  She is still driving within 1 mile of her home, but is afraid she would get lost if going any farther.  Otherwise, does not drive.  6CIT Screen 06/03/2019  What Year? 0 points  What month? 3 points  What time? 3 points  Count back from 20 2 points  Months in reverse 4 points  Repeat phrase 10 points  Total Score 22   MMSE - Mini Mental State Exam 07/02/2018 02/21/2018 02/19/2017  Orientation to time 2 5 3   Orientation to time comments - - Does not know date or day of the week.  Orientation to Place 5 5 5   Registration 3 3 3     Attention/ Calculation 0 0 0  Recall 0 0 0  Recall-comments - unable to recall 3 of 3 words -  Language- name 2 objects 2 0 0  Language- repeat 1 1 1   Language- follow 3 step command 3 3 3   Language- read & follow direction 1 0 0  Write a sentence 1 0 0  Copy design 1 0 0  Total score 19 17 15     Social History   Tobacco Use   Smoking status: Never Smoker   Smokeless tobacco: Never Used  Substance Use Topics   Alcohol use: No    Alcohol/week: 0.0 standard drinks    Comment: Rare   Drug use: No   Review of Systems Per HPI unless specifically indicated above    Objective:    BP (!) 132/53    Pulse 69    Ht 5\' 7"  (1.702 m)    Wt 135 lb 6.4 oz (61.4 kg)    LMP 08/14/1989    BMI 21.21 kg/m   Wt Readings from Last 3 Encounters:  06/03/19 135 lb 6.4 oz (61.4 kg)  05/16/19 137 lb 11.2 oz (62.5 kg)  02/04/19 139 lb (63 kg)    Physical Exam Vitals signs reviewed.  Constitutional:      General: She is not in acute distress.    Appearance: She is well-developed.  HENT:     Head: Normocephalic and atraumatic.  Cardiovascular:  Rate and Rhythm: Normal rate and regular rhythm.     Pulses:          Radial pulses are 2+ on the right side and 2+ on the left side.       Posterior tibial pulses are 1+ on the right side and 1+ on the left side.     Heart sounds: Normal heart sounds, S1 normal and S2 normal.  Pulmonary:     Effort: Pulmonary effort is normal. No respiratory distress.     Breath sounds: Normal breath sounds and air entry.  Musculoskeletal:     Right lower leg: No edema.     Left lower leg: No edema.     Comments: Muscle hypertonicity left buttock, lumbar spine at ischial tuberosity.  Skin:    General: Skin is warm and dry.     Capillary Refill: Capillary refill takes less than 2 seconds.  Neurological:     Mental Status: She is alert and oriented to person, place, and time.     GCS: GCS eye subscore is 4. GCS verbal subscore is 5. GCS motor subscore is  6.     Cranial Nerves: Cranial nerves are intact.     Sensory: Sensation is intact.     Motor: Motor function is intact.     Coordination: Coordination is intact.     Comments: Memory impairment worsening  Psychiatric:        Attention and Perception: Attention normal.        Mood and Affect: Mood and affect normal.        Speech: Speech normal.        Behavior: Behavior normal. Behavior is cooperative.        Thought Content: Thought content normal.        Cognition and Memory: Cognition is impaired. Memory is impaired. She exhibits impaired recent memory.    Results for orders placed or performed in visit on 05/16/19  CULTURE, URINE COMPREHENSIVE   Specimen: Urine   UR  Result Value Ref Range   Urine Culture, Comprehensive Final report (A)    Organism ID, Bacteria Escherichia coli (A)    ANTIMICROBIAL SUSCEPTIBILITY Comment   Microscopic Examination   URINE  Result Value Ref Range   WBC, UA >30 (A) 0 - 5 /hpf   RBC 3-10 (A) 0 - 2 /hpf   Epithelial Cells (non renal) None seen 0 - 10 /hpf   Crystals Present (A) N/A   Crystal Type Amorphous Sediment N/A   Bacteria, UA Many (A) None seen/Few  Urinalysis, Complete  Result Value Ref Range   Specific Gravity, UA 1.020 1.005 - 1.030   pH, UA 7.0 5.0 - 7.5   Color, UA Yellow Yellow   Appearance Ur Cloudy (A) Clear   Leukocytes,UA 1+ (A) Negative   Protein,UA Negative Negative/Trace   Glucose, UA Negative Negative   Ketones, UA Negative Negative   RBC, UA 1+ (A) Negative   Bilirubin, UA Negative Negative   Urobilinogen, Ur 0.2 0.2 - 1.0 mg/dL   Nitrite, UA Positive (A) Negative   Microscopic Examination See below:       Assessment & Plan:   Problem List Items Addressed This Visit      Cardiovascular and Mediastinum   Essential hypertension, benign Stable on exam today with BP at goal < 130/80.  Continue current medications without changes.  Follow-up 6 months.    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  Follows with cardiology in December.     Musculoskeletal and Integument   DEGENERATIVE DISC DISEASE, LUMBAR SPINE Chronic and is source of nearly constant pain.  Limits movements if she has been very active.  Currently moderately controlled with Tramadol bid prn.  Continue medication. Refills provided today.  Consider future physical therapy for pain control.  Declined today.     Other   Hyperlipidemia Status unknown.  Recheck labs at next visit.  No interval ASCVD events.  Continue meds without changes today.  Refills provided. Followup prn after labs.    Short-term memory loss Worsening severely since last visit for ability to recall history and participate in HPI.  Patient's husband is assisting significantly.  - Patient and husband in agreement to start donepezil.  Discussed side effects.  Discussed need for reassessment of memory at subsequent visits, consider cessation of medication in future when no longer desiring to prevent progression of illness. - Follow-up 3 months with repeat MMSE/6CIT and med management.   Relevant Medications   donepezil (ARICEPT) 5 MG tablet    Other Visit Diagnoses    Needs flu shot     Relevant Orders   Flu Vaccine QUAD High Dose(Fluad)      Meds ordered this encounter  Medications   donepezil (ARICEPT) 5 MG tablet    Sig: Take 1 tablet (5 mg total) by mouth at bedtime.    Dispense:  90 tablet    Refill:  1    Order Specific Question:   Supervising Provider    Answer:   Olin Hauser [[id number]]    Follow up plan: Return in about 3 months (around 09/03/2019) for dementia, hypertension.  Cassell Smiles, DNP, AGPCNP-BC Adult Gerontology Primary Care Nurse Practitioner [facility] 06/03/2019, 1:27 PM

## 2019-06-05 ENCOUNTER — Encounter: Payer: Self-pay | Admitting: Nurse Practitioner

## 2019-06-16 ENCOUNTER — Encounter: Payer: Self-pay | Admitting: Podiatry

## 2019-06-16 ENCOUNTER — Other Ambulatory Visit: Payer: Self-pay

## 2019-06-16 ENCOUNTER — Ambulatory Visit (INDEPENDENT_AMBULATORY_CARE_PROVIDER_SITE_OTHER): Payer: PPO | Admitting: Podiatry

## 2019-06-16 DIAGNOSIS — M205X9 Other deformities of toe(s) (acquired), unspecified foot: Secondary | ICD-10-CM | POA: Diagnosis not present

## 2019-06-16 DIAGNOSIS — M79609 Pain in unspecified limb: Secondary | ICD-10-CM | POA: Diagnosis not present

## 2019-06-16 DIAGNOSIS — B351 Tinea unguium: Secondary | ICD-10-CM

## 2019-06-16 DIAGNOSIS — L84 Corns and callosities: Secondary | ICD-10-CM

## 2019-06-16 NOTE — Progress Notes (Signed)
Complaint:  Visit Type: Patient returns to my office for continued preventative foot care services. Complaint: Patient states" my nails have grown long and thick and become painful to walk and wear shoes" . The patient presents for preventative foot care services. No changes to ROS. Patient has history of RA.  Podiatric Exam: Vascular: dorsalis pedis and posterior tibial pulses are palpable bilateral. Capillary return is immediate. Temperature gradient is WNL. Skin turgor WNL  Sensorium: Normal Semmes Weinstein monofilament test. Normal tactile sensation bilaterally. Nail Exam: Pt has thick disfigured discolored nails with subungual debris noted bilateral entire nail hallux through fifth toenails Ulcer Exam: There is no evidence of ulcer or pre-ulcerative changes or infection. Orthopedic Exam: Muscle tone and strength are WNL. No limitations in general ROM. No crepitus or effusions noted. Hallux limitus 1st MPJ  B/L.  Hammer toe second  B/L Skin: No Porokeratosis. No infection or ulcers.  Porokeratosis sub 1 left foot.  Diagnosis:  Onychomycosis, , Pain in right toe, pain in left toes  Porokeratosis Treatment & Plan Procedures and Treatment: Consent by patient was obtained for treatment procedures. The patient understood the discussion of treatment and procedures well. All questions were answered thoroughly reviewed. Debridement of mycotic and hypertrophic toenails, 1 through 5 bilateral and clearing of subungual debris. No ulceration, no infection noted. Debride porokeratosis sub 1 left foot. Return Visit-Office Procedure: Patient instructed to return to the office for a follow up visit 3 months for continued evaluation and treatment.    Darrio Bade DPM 

## 2019-06-18 ENCOUNTER — Other Ambulatory Visit: Payer: PPO

## 2019-06-18 ENCOUNTER — Other Ambulatory Visit: Payer: Self-pay

## 2019-06-18 DIAGNOSIS — Z87448 Personal history of other diseases of urinary system: Secondary | ICD-10-CM

## 2019-06-19 ENCOUNTER — Other Ambulatory Visit: Payer: PPO

## 2019-06-19 ENCOUNTER — Other Ambulatory Visit: Payer: Self-pay

## 2019-06-19 DIAGNOSIS — Z87448 Personal history of other diseases of urinary system: Secondary | ICD-10-CM | POA: Diagnosis not present

## 2019-06-19 LAB — URINALYSIS, COMPLETE
Bilirubin, UA: NEGATIVE
Glucose, UA: NEGATIVE
Nitrite, UA: NEGATIVE
Specific Gravity, UA: 1.025 (ref 1.005–1.030)
Urobilinogen, Ur: 0.2 mg/dL (ref 0.2–1.0)
pH, UA: 5.5 (ref 5.0–7.5)

## 2019-06-19 LAB — MICROSCOPIC EXAMINATION

## 2019-07-21 ENCOUNTER — Other Ambulatory Visit: Payer: Self-pay | Admitting: Cardiovascular Disease

## 2019-07-31 ENCOUNTER — Ambulatory Visit (INDEPENDENT_AMBULATORY_CARE_PROVIDER_SITE_OTHER): Payer: PPO | Admitting: Family

## 2019-07-31 ENCOUNTER — Encounter: Payer: Self-pay | Admitting: Family

## 2019-07-31 ENCOUNTER — Other Ambulatory Visit: Payer: Self-pay

## 2019-07-31 VITALS — BP 130/78 | HR 63 | Ht 67.0 in | Wt 130.5 lb

## 2019-07-31 DIAGNOSIS — I25111 Atherosclerotic heart disease of native coronary artery with angina pectoris with documented spasm: Secondary | ICD-10-CM | POA: Diagnosis not present

## 2019-07-31 DIAGNOSIS — I493 Ventricular premature depolarization: Secondary | ICD-10-CM

## 2019-07-31 DIAGNOSIS — R0600 Dyspnea, unspecified: Secondary | ICD-10-CM | POA: Diagnosis not present

## 2019-07-31 DIAGNOSIS — I10 Essential (primary) hypertension: Secondary | ICD-10-CM

## 2019-07-31 MED ORDER — NITROGLYCERIN 0.4 MG SL SUBL: 0.4000 mg | SUBLINGUAL_TABLET | SUBLINGUAL | 3 refills | Status: DC | PRN

## 2019-07-31 MED ORDER — DILTIAZEM HCL ER COATED BEADS 240 MG PO CP24: 240.0000 mg | ORAL_CAPSULE | Freq: Every day | ORAL | 3 refills | Status: DC

## 2019-07-31 NOTE — Progress Notes (Signed)
Office Visit    Patient Name: Kristin [name] Date of Encounter: 07/31/2019  Primary Care Provider:  Mikey College, NP (Inactive) Primary Cardiologist:  Kathlyn Sacramento, MD Electrophysiologist:  None   Chief Complaint    Kristin [name] is a 78 y.o. female with a hx of HTN, PVC, inflammatory arthritis/osteoarthritis and chronic pain, OSA not on CPAP,  [name], MI in 1996 thought secondary to coronary spasm presents today for annual follow-up of HTN, PVC  Past Medical History    Past Medical History:  Diagnosis Date  . Allergy   . Arthritis    recent falls -aug 2010  . [name] (coronary artery disease)    mi-1996  . Complication of anesthesia    pt states takes a long time to wake her wake up  . 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 ([place]) 08/27/2015  . Cough    no fever   . Degeneration of lumbar or lumbosacral intervertebral disc   . Dizziness    r/t meds  . DVT (deep venous thrombosis) ([place])   . Fibromyalgia   . History of shingles   . [name] (irritable bowel syndrome)    takes OTC [place] Negus colon health  . Joint pain   . Joint swelling   . Myalgia and myositis, unspecified   . Obstructive sleep apnea (adult) (pediatric)   . Old myocardial infarction 1996  . Osteoarthritis   . Osteoarthrosis, unspecified whether generalized or localized, unspecified site   . Psoriatic arthritis ([place])   . Rheumatoid arthritis ([place])   . Rosacea   . Seasonal allergies   . Stroke [facility])    ischaemic microvascular disease  . 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 Unspecified essential hypertension    takes Diltiazem,Metoprolol,and Losartan daily  . UTI (lower urinary tract infection)    frequent=last one was winter of 2014   Past Surgical History:  Procedure Laterality Date  . ATHERECTOMY  1996  . [facility]   vasospams; no blockage  . CARDIAC  CATHETERIZATION  2013   No obstructive coronary artery disease. Mild distal LAD and proximal RCA disease.  [name] CATARACT EXTRACTION Bilateral   . COLONOSCOPY    . CT ABD WO/W & PELVIS WO CM  2013   no acute process, + minimal diverticulosis and hepatic/renal cysts, ATH calcifications  . ETHMOIDECTOMY  1998  . 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 NASAL SINUS SURGERY  1998  . OOPHORECTOMY     BSO  . sinus biopsy  1998   inverted papiloma  . VAGINAL HYSTERECTOMY  1992   for mennorhagia-LAVH BSO    Allergies  Allergies  Allergen Reactions  . Angiotensin Receptor Blockers Anaphylaxis    Fatigue & Dizziness  . Other Anaphylaxis    Other reaction(s): Other (See Comments) Dizziness, rash Fatigue & Dizziness  . Hydralazine Other (See Comments)    Unknown  . Latex     REACTION: Rash, blisters, breathing probs.  . Losartan     Dizziness   . Methotrexate Derivatives     Dizziness, rash   . Spironolactone     Dizziness   . Penicillins Hives and Rash    History of Present Illness  Kristin [name] is a 78 y.o. female with a hx of  HTN, PVC, inflammatory arthritis/osteoarthritis and chronic pain, OSA, [name] s/p MI in 1996 thought secondary to coronary spasm last seen 08/02/2018 by Dr. Fletcher Anon.  She reports today for annual follow-up of her HTN and PVCs.  She is a retired Therapist, sports.  She had a cardiac catheterization 05/2012 with no significant [name], mild distal LAD and RCA disease.  Known palpitations due to PVCs and ventricular bigeminy.  Known history of refractory hypertension with negative work-up for secondary hypertension.  She has had difficulty with volume depletion and hypotension on antihypertensive therapies.  Previously on CPAP for OSA but reports she could not tolerate CPAP.  She was started on Donazepil by her PCP in October  for worsening short term memory loss.   Presents today with her husband due to memory impairment.  Reports her memory impairment has been somewhat frustrating as it is difficult as a retired Therapist, sports to not feel she is completely in control of her health care.  Reports no chest pain, pressure, tightness since last office visit.  She reports no shortness of breath at rest.  She does report some dyspnea on exertion.  Reports onset over the last year.  Has difficulty determining whether this is getting better, worse, or stable.  She tells me that she does not eat much salt, but her husband does say that they occasionally will eat foods such as potato chips.  She has no formal exercise regimen, her activity is primarily limited by knee pain and inflammatory arthritis/osteoarthritis  She reports no palpitations, irregular heartbeats.  Takes her Cardizem regularly, her husband helps her manage her medications.  Does not check BP at home.  Reports it is always good when checked.  She reports intermittent lightheadedness, no notable pattern.  No falls.  Encouraged her to remain adequately hydrated.  She does endorse lower extremity edema.  Does not wear compression stockings.  Tells me she is up or sitting in a dependent position for the majority of the day.  We discussed wearing compression stocking husband told me they to purchase them and he will help her to remember to wear them.  Likely etiology venous insufficiency as she has notable varicose veins to her bilateral lower extremity.  EKGs/Labs/Other Studies Reviewed:   The following studies were reviewed today:  EKG:  EKG is ordered today.  The ekg ordered today demonstrates sinus rhythm rate 63 bpm, no acute ST/T wave changes  Recent Labs: No results found for requested labs within last 8760 hours.  Recent Lipid Panel    Component Value Date/Time   CHOL 229 (H) 02/21/2018 1035   TRIG 106.0 02/21/2018 1035   HDL 73.20 02/21/2018 1035   CHOLHDL 3  02/21/2018 1035   VLDL 21.2 02/21/2018 1035   LDLCALC 134 (H) 02/21/2018 1035   LDLDIRECT 158.1 10/02/2013 0914    Home Medications   Current Meds  Medication Sig  . acetaminophen (TYLENOL) 325 MG tablet Take 325 mg by mouth every 8 (eight) hours as needed (pain).   . ASPIRIN 81 PO Take 1 tablet by mouth daily.  . Calcium Carbonate-Vitamin D (CALTRATE 600+D PO) Take 1 tablet by mouth 2 (two) times daily.   [name] conjugated estrogens (PREMARIN) vaginal cream Place 1 Applicatorful vaginally daily. Use pea sized amount M-W-Fr before bedtime  . diltiazem (CARDIZEM CD) 240 MG 24 hr capsule Take 1 capsule (240 mg total) by mouth daily.  [name] donepezil (ARICEPT) 5 MG tablet Take  1 tablet (5 mg total) by mouth at bedtime.  . Ibuprofen (MOTRIN PO) Take 1 tablet by mouth at bedtime.  . Multiple Vitamin (MULTIVITAMIN) tablet Take 1 tablet by mouth daily.    . nitroGLYCERIN (NITROSTAT) 0.4 MG SL tablet Place 1 tablet (0.4 mg total) under the tongue every 5 (five) minutes as needed for chest pain.  . NON FORMULARY 3,000 mg. CBD Tincture  . Omega-3 Fatty Acids (FISH OIL) 1200 MG CAPS Take 1 capsule by mouth 2 (two) times daily.   Vladimir Faster Glycol-Propyl Glycol (SYSTANE) 0.4-0.3 % SOLN Apply 1 drop to eye daily as needed (dry eyes).   . Probiotic Product ([place]) CAPS Take 1 capsule by mouth daily.  . traMADol (ULTRAM) 50 MG tablet TAKE ONE TABLET (50 MG) BY MOUTH EVERY 12 HOURS AS NEEDED FOR MODERATE PAIN  . triamcinolone cream (KENALOG) 0.1 %   . [DISCONTINUED] diltiazem (CARDIZEM CD) 240 MG 24 hr capsule TAKE 1 CAPSULE EVERY DAY  . [DISCONTINUED] nitroGLYCERIN (NITROSTAT) 0.4 MG SL tablet Place 1 tablet (0.4 mg total) every 5 (five) minutes as needed under the tongue for chest pain.    Review of Systems      Review of Systems  Constitution: Negative for chills, fever and malaise/fatigue.  Cardiovascular: Positive for dyspnea on exertion and leg swelling. Negative for chest pain,  near-syncope, orthopnea, palpitations and syncope.  Respiratory: Negative for cough, shortness of breath and wheezing.   Musculoskeletal: Positive for arthritis and joint pain.  Gastrointestinal: Negative for nausea and vomiting.  Neurological: Positive for light-headedness. Negative for dizziness and weakness.   All other systems reviewed and are otherwise negative except as noted above.  Physical Exam    VS:  BP 130/78 (BP Location: Left Arm, Patient Position: Sitting, Cuff Size: Normal)   Pulse 63   Ht 5\' 7"  (1.702 m)   Wt 130 lb 8 oz (59.2 kg)   LMP 08/14/1989   SpO2 98%   BMI 20.44 kg/m  , BMI Body mass index is 20.44 kg/m. GEN: Well nourished, well developed, in no acute distress. HEENT: normal. Neck: Supple, no JVD, carotid bruits, or masses. Cardiac: RRR, no murmurs, rubs, or gallops. No clubbing, cyanosis.  Radials/PT 2+ and equal bilaterally.  Nonpitting edema bilateral lower extremity from ankle to mid calf. Respiratory:  Respirations regular and unlabored, clear to auscultation bilaterally. GI: Soft, nontender, nondistended, BS + x 4. MS: No deformity or atrophy. Skin: Warm and dry, no rash.  Varicose veins noted to bilateral lower extremity Neuro:  Strength and sensation are intact.  Noted short-term memory impairment. Psych: Normal affect.  Accessory Clinical Findings    ECG personally reviewed by me today -sinus rhythm 63 bpm with no acute ST/T wave changes- no acute changes.  Assessment & Plan    1. [name] - Cardiac cath 2013 with mild nonobstructive disease.  Stable with no anginal symptoms.  EKG today sinus rhythm 63 bpm with no acute ST/T wave changes.  No indication for ischemic evaluation at this time.  Continue aspirin, CCB, as needed nitroglycerin.  She has declined statin in the past.   2. LE edema - Unclear onset, reports they have "always swollen ".  Bilateral lower extremity with nonpitting edema from ankle to calf.  They are tender on palpation. Likely  etiology venous insufficiency, she has notable varicose veins to bilateral LE. Plan for echocardiogram, as below, to rule out heart failure.   Recommend low-sodium diet.  Recommend elevating lower extremities when sitting.  Recommend compression stockings during daytime hours.  3. DOE - Reports onset over the last year.  Noted with activity such as walking around the house or walking up the stairs.  Etiology physical deconditioning vs chronic pain vs heart failure. Last echo 2013 with normal LVEF, mild MR, gr1DD. Will plan to reassess MV and LVEF.  Echocardiogram ordered.  4. PVC - No evidence of recurrence on EKG today.  She denies palpitations.  Continue Cardizem to 240 mg daily  5. HTN -BP well controlled.  Continue Cardizem 240 mg daily.  6. Lightheadedness -reports intermittent lightheadedness.  No falls.  Difficult history due to memory impairment.  Recommend remaining adequately hydrated.  Recommend compression stockings.  Disposition: Echocardiogram ordered today. Follow up in 1 year(s) with Dr. Fletcher Anon.  If echocardiogram with acute findings, will schedule for sooner office visit.   Loel Dubonnet, NP 07/31/2019, 2:19 PM

## 2019-07-31 NOTE — Patient Instructions (Addendum)
Medication Instructions:  - Your physician recommends that you continue on your current medications as directed. Please refer to the Current Medication list given to you today.  *If you need a refill on your cardiac medications before your next appointment, please call your pharmacy*  Lab Work: - none ordered   If you have labs (blood work) drawn today and your tests are completely normal, you will receive your results only by: [name] MyChart Message (if you have MyChart) OR . A paper copy in the mail If you have any lab test that is abnormal or we need to change your treatment, we will call you to review the results.  Testing/Procedures: - Your physician has requested that you have an echocardiogram. Echocardiography is a painless test that uses sound waves to create images of your heart. It provides your doctor with information about the size and shape of your heart and how well your heart's chambers and valves are working. This procedure takes approximately one hour. There are no restrictions for this procedure.   Follow-Up: At [facility], you and your health needs are our priority.  As part of our continuing mission to provide you with exceptional heart care, we have created designated Provider Care Teams.  These Care Teams include your primary Cardiologist (physician) and Advanced Practice Providers (APPs -  Physician Assistants and Nurse Practitioners) who all work together to provide you with the care you need, when you need it.  Your next appointment:   1 year(s)  The format for your next appointment:   In Person  Provider:    You may see Kathlyn Sacramento, MD or one of the following Advanced Practice Providers on your designated Care Team:    Murray Hodgkins, NP  Christell Faith, PA-C  Marrianne Mood, PA-C   Other Instructions   Recommend low sodium, heart healthy diet.  Recommend compression stockings during the daytime.  Recommend elevating your lower extremities when  sitting.

## 2019-08-29 ENCOUNTER — Ambulatory Visit (INDEPENDENT_AMBULATORY_CARE_PROVIDER_SITE_OTHER): Payer: PPO

## 2019-08-29 ENCOUNTER — Other Ambulatory Visit: Payer: Self-pay

## 2019-08-29 DIAGNOSIS — R0600 Dyspnea, unspecified: Secondary | ICD-10-CM

## 2019-09-01 ENCOUNTER — Ambulatory Visit: Payer: PPO | Admitting: Nurse Practitioner

## 2019-09-15 ENCOUNTER — Ambulatory Visit: Payer: PPO | Admitting: Podiatry

## 2019-09-18 ENCOUNTER — Other Ambulatory Visit: Payer: Self-pay

## 2019-09-18 ENCOUNTER — Encounter: Payer: Self-pay | Admitting: Podiatry

## 2019-09-18 ENCOUNTER — Ambulatory Visit: Payer: PPO | Admitting: Podiatry

## 2019-09-18 DIAGNOSIS — M79609 Pain in unspecified limb: Secondary | ICD-10-CM

## 2019-09-18 DIAGNOSIS — Q828 Other specified congenital malformations of skin: Secondary | ICD-10-CM | POA: Insufficient documentation

## 2019-09-18 DIAGNOSIS — M0579 Rheumatoid arthritis with rheumatoid factor of multiple sites without organ or systems involvement: Secondary | ICD-10-CM | POA: Diagnosis not present

## 2019-09-18 DIAGNOSIS — B351 Tinea unguium: Secondary | ICD-10-CM | POA: Diagnosis not present

## 2019-09-18 NOTE — Progress Notes (Signed)
Complaint:  Visit Type: Patient returns to my office for continued preventative foot care services. Complaint: Patient states" my nails have grown long and thick and become painful to walk and wear shoes" . The patient presents for preventative foot care services. No changes to ROS. Patient has history of RA.  Podiatric Exam: Vascular: dorsalis pedis and posterior tibial pulses are palpable bilateral. Capillary return is immediate. Temperature gradient is WNL. Skin turgor WNL  Sensorium: Normal Semmes Weinstein monofilament test. Normal tactile sensation bilaterally. Nail Exam: Pt has thick disfigured discolored nails with subungual debris noted bilateral entire nail hallux through fifth toenails Ulcer Exam: There is no evidence of ulcer or pre-ulcerative changes or infection. Orthopedic Exam: Muscle tone and strength are WNL. No limitations in general ROM. No crepitus or effusions noted. Hallux limitus 1st MPJ  B/L.  Hammer toe second  B/L Skin: No Porokeratosis. No infection or ulcers.  Porokeratosis sub 1 left foot.  Diagnosis:  Onychomycosis, , Pain in right toe, pain in left toes  Porokeratosis Treatment & Plan Procedures and Treatment: Consent by patient was obtained for treatment procedures. The patient understood the discussion of treatment and procedures well. All questions were answered thoroughly reviewed. Debridement of mycotic and hypertrophic toenails, 1 through 5 bilateral and clearing of subungual debris. No ulceration, no infection noted. Debride porokeratosis sub 1 left foot. Return Visit-Office Procedure: Patient instructed to return to the office for a follow up visit 3 months for continued evaluation and treatment.    Gardiner Barefoot DPM

## 2019-10-06 ENCOUNTER — Other Ambulatory Visit: Payer: Self-pay

## 2019-10-06 ENCOUNTER — Encounter: Payer: Self-pay | Admitting: Family Medicine

## 2019-10-06 ENCOUNTER — Ambulatory Visit (INDEPENDENT_AMBULATORY_CARE_PROVIDER_SITE_OTHER): Payer: PPO | Admitting: Family Medicine

## 2019-10-06 VITALS — BP 132/52 | HR 65 | Temp 97.1°F | Ht 67.0 in | Wt 128.4 lb

## 2019-10-06 DIAGNOSIS — I10 Essential (primary) hypertension: Secondary | ICD-10-CM | POA: Diagnosis not present

## 2019-10-06 DIAGNOSIS — R413 Other amnesia: Secondary | ICD-10-CM

## 2019-10-06 MED ORDER — MEMANTINE HCL 10 MG PO TABS: ORAL_TABLET | ORAL | 0 refills | Status: DC

## 2019-10-06 NOTE — Assessment & Plan Note (Signed)
Reviewed [name]' chart and her current symptoms.  Has continued to progress with memory loss, per husband [name].  Has been taking Aricept for 4 weeks without improvement.  Chart review shows that [name] had met with Neurology 07/02/2018, Dr. Andrey Spearman and was to have a Brain MRI and begin Namenda.  Husband, Darnell Level, believes that the Brain MRI was not completed and he cannot recall her beginning this medication in the past.  Discussed reaching out to care management for assistance with coordinating with Neurology as well as switching from Aricept to [place].  Patient and husband in agreement.  Plan: 1) Stop Aricept.  Begin Namenda as directed 2) Coordinate with care management for assistance with Neurology, imaging that may be needed and assessing what resources may be needed in home 3) We will see you back in clinic in 4 weeks for re-evaluation

## 2019-10-06 NOTE — Progress Notes (Signed)
I have reviewed this encounter including the documentation in this note and/or discussed this patient with the provider, Cyndia Skeeters FNP. I am certifying that I agree with the content of this note as supervising physician.  Nobie Putnam, DO [facility] 10/06/2019, 5:21 PM

## 2019-10-06 NOTE — Progress Notes (Signed)
Subjective:    Patient ID: Kristin [name], female    DOB: December 02, 1940, 79 y.o.   MRN: [id number]  Kristin [name] is a 79 y.o. female presenting on 10/06/2019 for Hypertension and Memory Loss   HPI  Kristin [name] presents to clinic today with her husband, Kristin [name], is here for a hypertension follow up visit and for a refill on her Aricept.  Husband states that they have not seen any change with the Aricept.  Denies any worsening of symptoms, any headaches, dizziness, lightheadedness.  Husband continues to assist significantly and Kristin [name] reports she drives local within a mile of her home.  Depression screen [facility] 2/9 03/04/2019 02/21/2018 02/19/2017  Decreased Interest 0 0 0  Down, Depressed, Hopeless 0 0 0  PHQ - 2 Score 0 0 0  Altered sleeping - 0 -  Tired, decreased energy - 0 -  Change in appetite - 0 -  Feeling bad or failure about yourself  - 0 -  Trouble concentrating - 0 -  Moving slowly or fidgety/restless - 0 -  Suicidal thoughts - 0 -  PHQ-9 Score - 0 -  Difficult doing work/chores - Not difficult at all -  Some recent data might be hidden    Social History   Tobacco Use  . Smoking status: Never Smoker  . Smokeless tobacco: Never Used  Substance Use Topics  . Alcohol use: No    Alcohol/week: 0.0 standard drinks    Comment: Rare  . Drug use: No    Review of Systems  Constitutional: Negative for activity change, appetite change and fatigue.  HENT: Negative for nosebleeds and trouble swallowing.   Eyes: Negative for visual disturbance.  Respiratory: Negative.   Cardiovascular: Negative for chest pain, palpitations and leg swelling.  Gastrointestinal: Negative for abdominal distention, abdominal pain, constipation, diarrhea, nausea and vomiting.  Endocrine: Negative.   Genitourinary: Negative.   Musculoskeletal: Negative.   Skin: Negative.   Allergic/Immunologic: Negative.   Neurological: Negative for dizziness, tremors, seizures, syncope, facial asymmetry,  weakness, light-headedness, numbness and headaches.       Memory loss  Hematological: Negative.   Psychiatric/Behavioral: Positive for confusion, decreased concentration and sleep disturbance. Negative for agitation, behavioral problems, hallucinations, self-injury and suicidal ideas. The patient is not nervous/anxious and is not hyperactive.    Per HPI unless specifically indicated above     Objective:    BP (!) 132/52 (BP Location: Right Arm, Patient Position: Sitting, Cuff Size: Small)   Pulse 65   Temp (!) 97.1 F (36.2 C) (Temporal)   Ht '5\' 7"'$  (1.702 m)   Wt 128 lb 6.4 oz (58.2 kg)   LMP 08/14/1989   SpO2 100%   BMI 20.11 kg/m   Wt Readings from Last 3 Encounters:  10/06/19 128 lb 6.4 oz (58.2 kg)  07/31/19 130 lb 8 oz (59.2 kg)  06/03/19 135 lb 6.4 oz (61.4 kg)    Physical Exam Vitals reviewed.  Constitutional:      General: She is not in acute distress.    Appearance: Normal appearance. She is normal weight. She is not ill-appearing or toxic-appearing.  HENT:     Head: Normocephalic.  Eyes:     General: Lids are normal. Vision grossly intact. No visual field deficit.    Extraocular Movements: Extraocular movements intact.     Conjunctiva/sclera: Conjunctivae normal.     Pupils: Pupils are equal, round, and reactive to light.  Cardiovascular:     Rate and Rhythm: Normal  rate and regular rhythm.     Pulses: Normal pulses.     Heart sounds: Normal heart sounds. No murmur. No friction rub. No gallop.   Pulmonary:     Effort: Pulmonary effort is normal.     Breath sounds: Normal breath sounds.  Abdominal:     General: Abdomen is flat. Bowel sounds are normal.     Palpations: Abdomen is soft.     Tenderness: There is no abdominal tenderness.  Musculoskeletal:        General: Normal range of motion.     Cervical back: Normal range of motion.     Right lower leg: No edema.     Left lower leg: No edema.  Skin:    General: Skin is warm and dry.     Capillary  Refill: Capillary refill takes less than 2 seconds.  Neurological:     Mental Status: She is alert. She is confused.     Cranial Nerves: Cranial nerves are intact. No cranial nerve deficit or facial asymmetry.     Sensory: Sensation is intact.     Motor: Motor function is intact.     Coordination: Coordination is intact.     Gait: Gait is intact.     Deep Tendon Reflexes: Reflexes normal.  Psychiatric:        Attention and Perception: Attention and perception normal. She does not perceive auditory or visual hallucinations.        Mood and Affect: Mood normal. Affect is flat.        Speech: Speech is not delayed.        Behavior: Behavior normal. Behavior is cooperative.        Thought Content: Thought content normal.        Cognition and Memory: Memory is impaired. She exhibits impaired recent memory and impaired remote memory.        Judgment: Judgment normal.    Results for orders placed or performed in visit on 06/19/19  Microscopic Examination   URINE  Result Value Ref Range   WBC, UA 11-30 (A) 0 - 5 /hpf   RBC 0-2 0 - 2 /hpf   Epithelial Cells (non renal) 0-10 0 - 10 /hpf   Renal Epithel, UA 0-10 (A) None seen /hpf   Bacteria, UA Moderate (A) None seen/Few  Urinalysis, Complete  Result Value Ref Range   Specific Gravity, UA 1.025 1.005 - 1.030   pH, UA 5.5 5.0 - 7.5   Color, UA Yellow Yellow   Appearance Ur Cloudy (A) Clear   Leukocytes,UA 2+ (A) Negative   Protein,UA 1+ (A) Negative/Trace   Glucose, UA Negative Negative   Ketones, UA Trace (A) Negative   RBC, UA Trace (A) Negative   Bilirubin, UA Negative Negative   Urobilinogen, Ur 0.2 0.2 - 1.0 mg/dL   Nitrite, UA Negative Negative   Microscopic Examination See below:       Assessment & Plan:   Problem List Items Addressed This Visit      Cardiovascular and Mediastinum   Essential hypertension, benign - Primary     Other   Short-term memory loss    Reviewed Kristin [name]' chart and her current symptoms.  Has  continued to progress with memory loss, per husband Kristin [name].  Has been taking Aricept for 4 weeks without improvement.  Chart review shows that Kristin [name] had met with Neurology 07/02/2018, Dr. Andrey Spearman and was to have a Brain MRI and begin Namenda.  Husband, Darnell Level, believes  that the Brain MRI was not completed and he cannot recall her beginning this medication in the past.  Discussed reaching out to care management for assistance with coordinating with Neurology as well as switching from Aricept to [place].  Patient and husband in agreement.  Plan: 1) Stop Aricept.  Begin Namenda as directed 2) Coordinate with care management for assistance with Neurology, imaging that may be needed and assessing what resources may be needed in home 3) We will see you back in clinic in 4 weeks for re-evaluation      Relevant Medications   memantine (NAMENDA) 10 MG tablet   Other Relevant Orders   Ambulatory referral to Chronic Care Management Services      Meds ordered this encounter  Medications  . memantine (NAMENDA) 10 MG tablet    Sig: Take 1 tablet at bedtime for 2 weeks and then increase to 1 tablet 2x a day.    Dispense:  60 tablet    Refill:  0    Order Specific Question:   Supervising Provider    Answer:   Olin Hauser [[id number]]      Follow up plan: Return in about 4 weeks (around 11/03/2019) for Telemedicine visit for memory loss with husband.  Harlin Rain, [facility] 10/06/2019, 1:55 PM

## 2019-10-06 NOTE — Patient Instructions (Signed)
We have changed your medication to Namenda to take 10mg  tablet 1x a day for the next 2 weeks and then begin to take Namenda 10mg  2x a day for the next 2 weeks.  We will plan to meet telephonically in 4 weeks to discuss how you're doing with you and Mr. Mendosa.  I will follow up with Neurology to see if they want the MRI that was discussed during your November 2019 appointment and we can coordinate.  As we discussed, can put in social work referral at your request and will defer at this time  You will receive a survey after today's visit either digitally by e-mail or paper by [employer]. Your experiences and feedback matter to [place].  Please respond so we know how we are doing as we provide care for you.  Call us with any questions/concerns/needs.  It is my goal to be available to you for your health concerns.  Thanks for choosing me to be a partner in your healthcare needs!  Harlin Rain, FNP-C Family Nurse Practitioner [facility] Phone: [phone]

## 2019-10-07 ENCOUNTER — Telehealth: Payer: Self-pay | Admitting: Family Medicine

## 2019-10-07 NOTE — Chronic Care Management (AMB) (Signed)
  Chronic Care Management   Note  10/07/2019 Name: Kristin [name] MRN: [id number] DOB: August 30, 1940  Kristin [name] is a 79 y.o. year old female who is a primary care patient of [name], [name], FNP. I reached out to Kristin [name] by phone today in response to a referral sent by Kristin [name] PCP, Cyndia Skeeters FNP     Kristin [name] was given information about Chronic Care Management services today including:  1. CCM service includes personalized support from designated clinical staff supervised by her physician, including individualized plan of care and coordination with other care providers 2. 24/7 contact phone numbers for assistance for urgent and routine care needs. 3. Service will only be billed when office clinical staff spend 20 minutes or more in a month to coordinate care. 4. Only one practitioner may furnish and bill the service in a calendar month. 5. The patient may stop CCM services at any time (effective at the end of the month) by phone call to the office staff. 6. The patient will be responsible for cost sharing (co-pay) of up to 20% of the service fee (after annual deductible is met).  Patient's spouse did not agree to enrollment in care management services and does not wish to consider at this time.  Follow up plan: The care management team is available to follow up with the patient after provider conversation with the patient regarding recommendation for care management engagement and subsequent re-referral to the care management team.   Kristin Durand, Kristin [name] Health Advisor, [facility] ??[email or web address] ??[phone]

## 2019-10-21 ENCOUNTER — Ambulatory Visit: Payer: PPO

## 2019-10-21 NOTE — Chronic Care Management (AMB) (Signed)
  Care Management   Follow Up Note   10/21/2019 Name: Kristin [name] MRN: [id number] DOB: 06/03/1941  Referred by: Verl Bangs, FNP Reason for referral : Care Coordination   Kristin [name] is a 79 y.o. year old female who is a primary care patient of [name], [name], FNP. The care management team was consulted for assistance with care management and care coordination needs.    Review of patient status, including review of consultants reports, relevant laboratory and other test results, and collaboration with appropriate care team members and the patient's provider was performed as part of comprehensive patient evaluation and provision of chronic care management services.    LCSW completed CCM outreach attempt today but was unable to reach patient successfully. A HIPPA compliant voice message was left encouraging patient to return call once available. LCSW rescheduled CCM SW appointment as well.  A HIPPA compliant phone message was left for the patient providing contact information and requesting a return call.   [name], BSW, MSW, [email or web address] Phone: [phone]

## 2019-11-03 ENCOUNTER — Other Ambulatory Visit: Payer: Self-pay

## 2019-11-03 ENCOUNTER — Encounter: Payer: Self-pay | Admitting: Family Medicine

## 2019-11-03 ENCOUNTER — Ambulatory Visit (INDEPENDENT_AMBULATORY_CARE_PROVIDER_SITE_OTHER): Payer: PPO | Admitting: Family Medicine

## 2019-11-03 ENCOUNTER — Ambulatory Visit: Payer: PPO | Admitting: Family Medicine

## 2019-11-03 VITALS — BP 133/53 | HR 63 | Temp 97.1°F | Ht 67.0 in | Wt 127.0 lb

## 2019-11-03 DIAGNOSIS — E782 Mixed hyperlipidemia: Secondary | ICD-10-CM

## 2019-11-03 DIAGNOSIS — I10 Essential (primary) hypertension: Secondary | ICD-10-CM | POA: Diagnosis not present

## 2019-11-03 DIAGNOSIS — R8290 Unspecified abnormal findings in urine: Secondary | ICD-10-CM | POA: Diagnosis not present

## 2019-11-03 DIAGNOSIS — R413 Other amnesia: Secondary | ICD-10-CM | POA: Diagnosis not present

## 2019-11-03 LAB — POCT URINALYSIS DIPSTICK
Bilirubin, UA: NEGATIVE
Blood, UA: NEGATIVE
Glucose, UA: NEGATIVE
Ketones, UA: NEGATIVE
Nitrite, UA: NEGATIVE
Protein, UA: NEGATIVE
Spec Grav, UA: 1.015 (ref 1.010–1.025)
Urobilinogen, UA: 0.2 E.U./dL
pH, UA: 5 (ref 5.0–8.0)

## 2019-11-03 NOTE — Progress Notes (Signed)
Subjective:    Patient ID: Kristin [name], female    DOB: 1941/03/03, 79 y.o.   MRN: [id number]  Kristin [name] is a 79 y.o. female presenting on 11/03/2019 for Memory Loss   HPI  Dementia: She is here for evaluation and treatment of cognitive problems. She is accompanied by patient and spouse. Primary caregiver is patient and spouse. The family and the patient identify problems with changes in short term memory. Family and patient are concerned about continued worsening of symptoms, however, they are not concerned about wandering, driving, cooking and inability to maintain adequate nutrition. Medication administration: family monitors medication usage and family sets up medications   Functional Assessment:  Activities of Daily Living (ADLs):   She is independent in the following: ambulation, bathing and hygiene, feeding, continence, grooming, toileting and dressing Instrumental Activities of Daily Living (IADLs):   She is independent in the following: Spouse assists with all iADLs Requires assistance with the following: Spouses assists with all iADLs  Has not yet contacted [facility], Dr. Bonnita Levan Penumalli's office for follow up appointment or brain MRI.  Depression screen [facility] 2/9 03/04/2019 02/21/2018 02/19/2017  Decreased Interest 0 0 0  Down, Depressed, Hopeless 0 0 0  PHQ - 2 Score 0 0 0  Altered sleeping - 0 -  Tired, decreased energy - 0 -  Change in appetite - 0 -  Feeling bad or failure about yourself  - 0 -  Trouble concentrating - 0 -  Moving slowly or fidgety/restless - 0 -  Suicidal thoughts - 0 -  PHQ-9 Score - 0 -  Difficult doing work/chores - Not difficult at all -  Some recent data might be hidden    Social History   Tobacco Use  . Smoking status: Never Smoker  . Smokeless tobacco: Never Used  Substance Use Topics  . Alcohol use: No    Alcohol/week: 0.0 standard drinks    Comment: Rare  . Drug use: No    Review of Systems  Unable  to perform ROS: Mental status change (Spouse, [name], here for exam/interview.  States memory loss has increased, no changes seen with changing from Aricept to [employer].)   Per HPI unless specifically indicated above     Objective:    BP (!) 133/53 (BP Location: Right Arm, Patient Position: Sitting, Cuff Size: Small)   Pulse 63   Temp (!) 97.1 F (36.2 C) (Temporal)   Ht 5\' 7"  (1.702 m)   Wt 127 lb (57.6 kg)   LMP 08/14/1989   BMI 19.89 kg/m   Wt Readings from Last 3 Encounters:  11/03/19 127 lb (57.6 kg)  10/06/19 128 lb 6.4 oz (58.2 kg)  07/31/19 130 lb 8 oz (59.2 kg)    Physical Exam Vitals reviewed.  Constitutional:      General: She is not in acute distress.    Appearance: Normal appearance. She is well-groomed and normal weight. She is not ill-appearing or toxic-appearing.  HENT:     Head: Normocephalic.  Eyes:     General: Lids are normal. Vision grossly intact.        Right eye: No discharge.        Left eye: No discharge.     Extraocular Movements: Extraocular movements intact.     Conjunctiva/sclera: Conjunctivae normal.     Pupils: Pupils are equal, round, and reactive to light.  Cardiovascular:     Rate and Rhythm: Normal rate and regular rhythm.     Pulses:  Normal pulses.          Dorsalis pedis pulses are 2+ on the right side and 2+ on the left side.       Posterior tibial pulses are 2+ on the right side and 2+ on the left side.     Heart sounds: Normal heart sounds. No murmur. No friction rub. No gallop.   Pulmonary:     Effort: Pulmonary effort is normal. No respiratory distress.     Breath sounds: Normal breath sounds.  Abdominal:     General: Abdomen is flat. Bowel sounds are normal. There is no distension.     Palpations: Abdomen is soft.  Musculoskeletal:     Right lower leg: No edema.     Left lower leg: No edema.  Feet:     Right foot:     Skin integrity: Skin integrity normal.     Left foot:     Skin integrity: Skin integrity normal.   Skin:    General: Skin is warm and dry.     Capillary Refill: Capillary refill takes less than 2 seconds.  Neurological:     General: No focal deficit present.     Mental Status: She is alert. Mental status is at baseline.     GCS: GCS eye subscore is 4. GCS verbal subscore is 5. GCS motor subscore is 6.     Cranial Nerves: Cranial nerves are intact. No cranial nerve deficit.     Sensory: Sensation is intact. No sensory deficit.     Motor: Motor function is intact. No weakness.     Coordination: Coordination is intact. Coordination normal.     Gait: Gait is intact. Gait normal.     Deep Tendon Reflexes: Reflexes normal.  Psychiatric:        Attention and Perception: Attention and perception normal.        Mood and Affect: Mood and affect normal.        Speech: Speech normal.        Behavior: Behavior normal. Behavior is cooperative.        Cognition and Memory: Memory is impaired. She exhibits impaired recent memory.     Comments: Defers to spouse for all interview/exam questions     Results for orders placed or performed in visit on 11/03/19  POCT Urinalysis Dipstick  Result Value Ref Range   Color, UA yellow    Clarity, UA clear    Glucose, UA Negative Negative   Bilirubin, UA negative    Ketones, UA negative    Spec Grav, UA 1.015 1.010 - 1.025   Blood, UA negative    pH, UA 5.0 5.0 - 8.0   Protein, UA Negative Negative   Urobilinogen, UA 0.2 0.2 or 1.0 E.U./dL   Nitrite, UA negative    Leukocytes, UA Large (3+) (A) Negative   Appearance     Odor        Assessment & Plan:   Problem List Items Addressed This Visit      Cardiovascular and Mediastinum   Essential hypertension, benign    Stable and well controlled hypertension.  BP goal < 130/80.  Pt no working on lifestyle modifications.  Taking medications tolerating well without side effects.   Plan: 1. Continue taking diltiazem 240mg  daily  2. Obtain labs ordered today in the next 1-2 weeks  3. Encouraged  heart healthy diet and increasing exercise to 30 minutes most days of the week, going no more than 2 days in  a row without exercise. 4. Check BP 1-2 x per week at home, keep log, and bring to clinic at next appointment. 5. Follow up 3-6 months.         Relevant Orders   CBC with Differential   COMPLETE METABOLIC PANEL WITH GFR   Urine Microalbumin w/creat. ratio     Other   Hyperlipidemia    Status unknown.  Recheck labs.  Previously diagnosed with hyperlipidemia but declination of medications to treat.  Will discuss with spouse and POA, [name], once labs resulted.  Labs ordered today, patient has eaten this morning, spouse requesting we draw labs anyway, as is difficult to coordinate without a medical appointment.  Will have drawn today.      Relevant Orders   CBC with Differential   COMPLETE METABOLIC PANEL WITH GFR   Lipid Profile   Short-term memory loss - Primary    Has recently changed from Aricept to [place] with worsening of memory loss.  Defers to husband for all questions during interview/exam.  U/A POCT showing leukocytes, will send for culture to see if there is urinary tract infection leading to increased short term memory loss.  Family has not yet contacted Dr. Evon Slack office with [facility] to schedule follow up and Brain MRI.  Plan: 1) Continue Namenda as directed 2) Follow up with Neurology, Dr. Gladstone Lighter office for appointment and MRI as previously ordered.      Relevant Orders   POCT Urinalysis Dipstick (Completed)   Urine Culture   CBC with Differential   COMPLETE METABOLIC PANEL WITH GFR    Other Visit Diagnoses    Abnormal urine findings       Relevant Orders   Urine Culture      No orders of the defined types were placed in this encounter.     Follow up plan: Return in about 3 months (around 02/03/2020) for HTN & Memory F/U.   Harlin Rain, [place] Family Nurse Practitioner [facility] 11/03/2019, 9:18 AM

## 2019-11-03 NOTE — Assessment & Plan Note (Signed)
Status unknown.  Recheck labs.  Previously diagnosed with hyperlipidemia but declination of medications to treat.  Will discuss with spouse and POA, [name], once labs resulted.  Labs ordered today, patient has eaten this morning, spouse requesting we draw labs anyway, as is difficult to coordinate without a medical appointment.  Will have drawn today.

## 2019-11-03 NOTE — Patient Instructions (Signed)
We have sent your urine to the lab for culture since the urinalysis was abnormal in clinic today.  Continue the Namenda and your other medications as directed.  Contact [facility] at [phone] to schedule follow up appointment with Dr. Andrey Spearman and to schedule the brain MRI that he had ordered from your first visit with him in 2019.  Try to get exercise a minimum of 30 minutes per day at least 5 days per week as well as  adequate water intake all while measuring blood pressure a few times per week.  Keep a blood pressure log and bring back to clinic at your next visit.  If your readings are consistently over 140/90 to contact our office/send me a MyChart message and we will see you sooner.  Can try DASH and Mediterranean diet options, avoiding processed foods, lowering sodium intake, avoiding pork products, and eating a plant based diet for optimal health.  We will plan to see you back in 3 months for hypertension re-evaluation and memory follow up  You will receive a survey after today's visit either digitally by e-mail or paper by [employer]. Your experiences and feedback matter to [place].  Please respond so we know how we are doing as we provide care for you.  Call us with any questions/concerns/needs.  It is my goal to be available to you for your health concerns.  Thanks for choosing me to be a partner in your healthcare needs!  Harlin Rain, FNP-C Family Nurse Practitioner [facility] Phone: [phone]

## 2019-11-03 NOTE — Assessment & Plan Note (Signed)
Stable and well controlled hypertension.  BP goal < 130/80.  Pt no working on lifestyle modifications.  Taking medications tolerating well without side effects.   Plan: 1. Continue taking diltiazem 240mg  daily  2. Obtain labs ordered today in the next 1-2 weeks  3. Encouraged heart healthy diet and increasing exercise to 30 minutes most days of the week, going no more than 2 days in a row without exercise. 4. Check BP 1-2 x per week at home, keep log, and bring to clinic at next appointment. 5. Follow up 3-6 months.

## 2019-11-03 NOTE — Assessment & Plan Note (Signed)
Has recently changed from Aricept to [facility] with worsening of memory loss.  Defers to husband for all questions during interview/exam.  U/A POCT showing leukocytes, will send for culture to see if there is urinary tract infection leading to increased short term memory loss.  Family has not yet contacted Dr. Evon Slack office with [facility] to schedule follow up and Brain MRI.  Plan: 1) Continue Namenda as directed 2) Follow up with Neurology, Dr. Gladstone Lighter office for appointment and MRI as previously ordered.

## 2019-11-04 ENCOUNTER — Ambulatory Visit (INDEPENDENT_AMBULATORY_CARE_PROVIDER_SITE_OTHER): Payer: PPO

## 2019-11-04 VITALS — Ht 67.0 in | Wt 127.0 lb

## 2019-11-04 DIAGNOSIS — Z0000 Encounter for general adult medical examination without abnormal findings: Secondary | ICD-10-CM | POA: Diagnosis not present

## 2019-11-04 LAB — CBC WITH DIFFERENTIAL/PLATELET
Absolute Monocytes: 644 cells/uL (ref 200–950)
Basophils Absolute: 81 cells/uL (ref 0–200)
Basophils Relative: 1.1 %
Eosinophils Absolute: 370 cells/uL (ref 15–500)
Eosinophils Relative: 5 %
HCT: 36.8 % (ref 35.0–45.0)
Hemoglobin: 12.1 g/dL (ref 11.7–15.5)
Lymphs Abs: 1177 cells/uL (ref 850–3900)
MCH: 31.6 pg (ref 27.0–33.0)
MCHC: 32.9 g/dL (ref 32.0–36.0)
MCV: 96.1 fL (ref 80.0–100.0)
MPV: 10.1 fL (ref 7.5–12.5)
Monocytes Relative: 8.7 %
Neutro Abs: 5128 cells/uL (ref 1500–7800)
Neutrophils Relative %: 69.3 %
Platelets: 258 10*3/uL (ref 140–400)
RBC: 3.83 10*6/uL (ref 3.80–5.10)
RDW: 13.7 % (ref 11.0–15.0)
Total Lymphocyte: 15.9 %
WBC: 7.4 10*3/uL (ref 3.8–10.8)

## 2019-11-04 LAB — URINE CULTURE
MICRO NUMBER:: 10278717
Result:: NO GROWTH
SPECIMEN QUALITY:: ADEQUATE

## 2019-11-04 LAB — COMPLETE METABOLIC PANEL WITH GFR
AG Ratio: 1.3 (calc) (ref 1.0–2.5)
ALT: 14 U/L (ref 6–29)
AST: 21 U/L (ref 10–35)
Albumin: 3.8 g/dL (ref 3.6–5.1)
Alkaline phosphatase (APISO): 51 U/L (ref 37–153)
BUN/Creatinine Ratio: 23 (calc) — ABNORMAL HIGH (ref 6–22)
BUN: 28 mg/dL — ABNORMAL HIGH (ref 7–25)
CO2: 30 mmol/L (ref 20–32)
Calcium: 10 mg/dL (ref 8.6–10.4)
Chloride: 104 mmol/L (ref 98–110)
Creat: 1.21 mg/dL — ABNORMAL HIGH (ref 0.60–0.93)
GFR, Est African American: 49 mL/min/{1.73_m2} — ABNORMAL LOW (ref >=60)
GFR, Est Non African American: 43 mL/min/{1.73_m2} — ABNORMAL LOW (ref >=60)
Globulin: 3 g/dL (calc) (ref 1.9–3.7)
Glucose, Bld: 85 mg/dL (ref 65–139)
Potassium: 4.1 mmol/L (ref 3.5–5.3)
Sodium: 141 mmol/L (ref 135–146)
Total Bilirubin: 0.4 mg/dL (ref 0.2–1.2)
Total Protein: 6.8 g/dL (ref 6.1–8.1)

## 2019-11-04 LAB — MICROALBUMIN / CREATININE URINE RATIO
Creatinine, Urine: 87 mg/dL (ref 20–275)
Microalb Creat Ratio: 21 mcg/mg creat (ref <30)
Microalb, Ur: 1.8 mg/dL

## 2019-11-04 LAB — LIPID PANEL
Cholesterol: 227 mg/dL — ABNORMAL HIGH (ref <200)
HDL: 65 mg/dL (ref >=50)
LDL Cholesterol (Calc): 136 mg/dL (calc) — ABNORMAL HIGH
Non-HDL Cholesterol (Calc): 162 mg/dL (calc) — ABNORMAL HIGH (ref <130)
Total CHOL/HDL Ratio: 3.5 (calc) (ref <5.0)
Triglycerides: 134 mg/dL (ref <150)

## 2019-11-04 NOTE — Patient Instructions (Signed)
Kristin [name] , Thank you for taking time to come for your Medicare Wellness Visit. I appreciate your ongoing commitment to your health goals. Please review the following plan we discussed and let me know if I can assist you in the future.   Screening recommendations/referrals: Colonoscopy: no longer required  Mammogram: completed 03/2019 Bone Density: up to date  Recommended yearly ophthalmology/optometry visit for glaucoma screening and checkup Recommended yearly dental visit for hygiene and checkup  Vaccinations: Influenza vaccine: up to date  Pneumococcal vaccine: up to date  Tdap vaccine: up to date  Shingles vaccine: shingrix eligible , check with pharmacy for vaccine coverage information  Covid-19: completed   Advanced directives: Please bring a copy of your health care power of attorney and living will to the office at your convenience.  Conditions/risks identified: none   Next appointment: Follow up in one year for your annual wellness visit    Preventive Care 65 Years and Older, Female Preventive care refers to lifestyle choices and visits with your health care provider that can promote health and wellness. What does preventive care include?  A yearly physical exam. This is also called an annual well check.  Dental exams once or twice a year.  Routine eye exams. Ask your health care provider how often you should have your eyes checked.  Personal lifestyle choices, including:  Daily care of your teeth and gums.  Regular physical activity.  Eating a healthy diet.  Avoiding tobacco and drug use.  Limiting alcohol use.  Practicing safe sex.  Taking low-dose aspirin every day.  Taking vitamin and mineral supplements as recommended by your health care provider. What happens during an annual well check? The services and screenings done by your health care provider during your annual well check will depend on your age, overall health, lifestyle risk factors, and  family history of disease. Counseling  Your health care provider may ask you questions about your:  Alcohol use.  Tobacco use.  Drug use.  Emotional well-being.  Home and relationship well-being.  Sexual activity.  Eating habits.  History of falls.  Memory and ability to understand (cognition).  Work and work Statistician.  Reproductive health. Screening  You may have the following tests or measurements:  Height, weight, and BMI.  Blood pressure.  Lipid and cholesterol levels. These may be checked every 5 years, or more frequently if you are over 7 years old.  Skin check.  Lung cancer screening. You may have this screening every year starting at age 64 if you have a 30-pack-year history of smoking and currently smoke or have quit within the past 15 years.  Fecal occult blood test (FOBT) of the stool. You may have this test every year starting at age 73.  Flexible sigmoidoscopy or colonoscopy. You may have a sigmoidoscopy every 5 years or a colonoscopy every 10 years starting at age 29.  Hepatitis C blood test.  Hepatitis B blood test.  Sexually transmitted disease (STD) testing.  Diabetes screening. This is done by checking your blood sugar (glucose) after you have not eaten for a while (fasting). You may have this done every 1-3 years.  Bone density scan. This is done to screen for osteoporosis. You may have this done starting at age 73.  Mammogram. This may be done every 1-2 years. Talk to your health care provider about how often you should have regular mammograms. Talk with your health care provider about your test results, treatment options, and if necessary, the need for  more tests. Vaccines  Your health care provider may recommend certain vaccines, such as:  Influenza vaccine. This is recommended every year.  Tetanus, diphtheria, and acellular pertussis (Tdap, Td) vaccine. You may need a Td booster every 10 years.  Zoster vaccine. You may need this  after age 64.  Pneumococcal 13-valent conjugate (PCV13) vaccine. One dose is recommended after age 58.  Pneumococcal polysaccharide (PPSV23) vaccine. One dose is recommended after age 71. Talk to your health care provider about which screenings and vaccines you need and how often you need them. This information is not intended to replace advice given to you by your health care provider. Make sure you discuss any questions you have with your health care provider. Document Released: 08/27/2015 Document Revised: 04/19/2016 Document Reviewed: 06/01/2015 Elsevier Interactive Patient Education  2017 [place] Prevention in the Home Falls can cause injuries. They can happen to people of all ages. There are many things you can do to make your home safe and to help prevent falls. What can I do on the outside of my home?  Regularly fix the edges of walkways and driveways and fix any cracks.  Remove anything that might make you trip as you walk through a door, such as a raised step or threshold.  Trim any bushes or trees on the path to your home.  Use bright outdoor lighting.  Clear any walking paths of anything that might make someone trip, such as rocks or tools.  Regularly check to see if handrails are loose or broken. Make sure that both sides of any steps have handrails.  Any raised decks and porches should have guardrails on the edges.  Have any leaves, snow, or ice cleared regularly.  Use sand or salt on walking paths during winter.  Clean up any spills in your garage right away. This includes oil or grease spills. What can I do in the bathroom?  Use night lights.  Install grab bars by the toilet and in the tub and shower. Do not use towel bars as grab bars.  Use non-skid mats or decals in the tub or shower.  If you need to sit down in the shower, use a plastic, non-slip stool.  Keep the floor dry. Clean up any water that spills on the floor as soon as it  happens.  Remove soap buildup in the tub or shower regularly.  Attach bath mats securely with double-sided non-slip rug tape.  Do not have throw rugs and other things on the floor that can make you trip. What can I do in the bedroom?  Use night lights.  Make sure that you have a light by your bed that is easy to reach.  Do not use any sheets or blankets that are too big for your bed. They should not hang down onto the floor.  Have a firm chair that has side arms. You can use this for support while you get dressed.  Do not have throw rugs and other things on the floor that can make you trip. What can I do in the kitchen?  Clean up any spills right away.  Avoid walking on wet floors.  Keep items that you use a lot in easy-to-reach places.  If you need to reach something above you, use a strong step stool that has a grab bar.  Keep electrical cords out of the way.  Do not use floor polish or wax that makes floors slippery. If you must use wax, use non-skid  floor wax.  Do not have throw rugs and other things on the floor that can make you trip. What can I do with my stairs?  Do not leave any items on the stairs.  Make sure that there are handrails on both sides of the stairs and use them. Fix handrails that are broken or loose. Make sure that handrails are as long as the stairways.  Check any carpeting to make sure that it is firmly attached to the stairs. Fix any carpet that is loose or worn.  Avoid having throw rugs at the top or bottom of the stairs. If you do have throw rugs, attach them to the floor with carpet tape.  Make sure that you have a light switch at the top of the stairs and the bottom of the stairs. If you do not have them, ask someone to add them for you. What else can I do to help prevent falls?  Wear shoes that:  Do not have high heels.  Have rubber bottoms.  Are comfortable and fit you well.  Are closed at the toe. Do not wear sandals.  If you  use a stepladder:  Make sure that it is fully opened. Do not climb a closed stepladder.  Make sure that both sides of the stepladder are locked into place.  Ask someone to hold it for you, if possible.  Clearly mark and make sure that you can see:  Any grab bars or handrails.  First and last steps.  Where the edge of each step is.  Use tools that help you move around (mobility aids) if they are needed. These include:  Canes.  Walkers.  Scooters.  Crutches.  Turn on the lights when you go into a dark area. Replace any light bulbs as soon as they burn out.  Set up your furniture so you have a clear path. Avoid moving your furniture around.  If any of your floors are uneven, fix them.  If there are any pets around you, be aware of where they are.  Review your medicines with your doctor. Some medicines can make you feel dizzy. This can increase your chance of falling. Ask your doctor what other things that you can do to help prevent falls. This information is not intended to replace advice given to you by your health care provider. Make sure you discuss any questions you have with your health care provider. Document Released: 05/27/2009 Document Revised: 01/06/2016 Document Reviewed: 09/04/2014 Elsevier Interactive Patient Education  2017 [employer].

## 2019-11-04 NOTE — Progress Notes (Signed)
Subjective:   Kristin [name] is a 79 y.o. female who presents for Medicare Annual (Subsequent) preventive examination.  Review of Systems:  Cardiac Risk Factors include: advanced age (>2men, >55 women);dyslipidemia;hypertension     Objective:     Vitals: Ht 5\' 7"  (1.702 m)   Wt 127 lb (57.6 kg)   LMP 08/14/1989   BMI 19.89 kg/m   Body mass index is 19.89 kg/m.  Advanced Directives 11/04/2019 06/14/2018 02/21/2018 08/21/2017 06/18/2017 02/19/2017 02/08/2017  Does Patient Have a Medical Advance Directive? Yes Yes Yes Yes Yes Yes No  Type of Advance Directive Living will;Healthcare Power of [place];Living will [place];Living will Living will [place];Living will [place];Living will -  Does patient want to make changes to medical advance directive? - - - - - - -  Copy of [place] in Chart? No - copy requested No - copy requested No - copy requested - - No - copy requested -  Would patient like information on creating a medical advance directive? - - - - - - No - Patient declined    Tobacco Social History   Tobacco Use  Smoking Status Never Smoker  Smokeless Tobacco Never Used     Counseling given: Not Answered   Clinical Intake:  Pre-visit preparation completed: Yes  Pain : 0-10 Pain Score: 3  Pain Type: Chronic pain Pain Location: Back     Nutritional Risks: None Diabetes: No  How often do you need to have someone help you when you read instructions, pamphlets, or other written materials from your doctor or pharmacy?: 1 - Never  Interpreter Needed?: No  Information entered by :: Mayce Noyes,LPN  Past Medical History:  Diagnosis Date  . Allergy   . Arthritis    recent falls -aug 2010  . [name] (coronary artery disease)    mi-1996  . Complication of anesthesia    pt states takes a long time to wake her wake up  . Coronary artery disease involving  native coronary artery with angina pectoris with documented spasm ([place]) 08/27/2015  . Cough    no fever   . Degeneration of lumbar or lumbosacral intervertebral disc   . Dizziness    r/t meds  . DVT (deep venous thrombosis) ([place])   . Fibromyalgia   . History of shingles   . [name] (irritable bowel syndrome)    takes OTC [place] Negus colon health  . Joint pain   . Joint swelling   . Myalgia and myositis, unspecified   . Obstructive sleep apnea (adult) (pediatric)   . Old myocardial infarction 1996  . Osteoarthritis   . Osteoarthrosis, unspecified whether generalized or localized, unspecified site   . Psoriatic arthritis ([place])   . Rheumatoid arthritis ([place])   . Rosacea   . Seasonal allergies   . Stroke [facility])    ischaemic microvascular disease  . 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 Unspecified essential hypertension    takes Diltiazem,Metoprolol,and Losartan daily  . UTI (lower urinary tract infection)    frequent=last one was winter of 2014   Past Surgical History:  Procedure Laterality Date  . ATHERECTOMY  1996  . [place]   vasospams; no blockage  . CARDIAC CATHETERIZATION  2013   No obstructive coronary artery disease. Mild distal LAD and proximal RCA disease.  [name] CATARACT EXTRACTION Bilateral   .  COLONOSCOPY    . CT ABD WO/W & PELVIS WO CM  2013   no acute process, + minimal diverticulosis and hepatic/renal cysts, ATH calcifications  . ETHMOIDECTOMY  1998  . 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 NASAL SINUS SURGERY  1998  . OOPHORECTOMY     BSO  . sinus biopsy  1998   inverted papiloma  . VAGINAL HYSTERECTOMY  1992   for mennorhagia-LAVH BSO   Family History  Problem Relation Age of Onset  . Colon cancer Father   . Heart attack Mother   .  Coronary artery disease Mother   . Osteoporosis Mother   . Heart disease Mother   . Heart failure Mother   . GER disease Sister   . Skin cancer Sister   . Hypertension Sister   . Osteopenia Sister   . GER disease Sister   . Breast cancer Paternal Aunt        age 74  . Heart failure Maternal Grandmother   . Heart failure Maternal Grandfather    Social History   Socioeconomic History  . Marital status: Married    Spouse name: Not on file  . Number of children: 2  . Years of education: BSN  . Highest education level: Not on file  Occupational History  . 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 Smoking status: Never Smoker  . Smokeless tobacco: Never Used  Substance and Sexual Activity  . Alcohol use: No    Alcohol/week: 0.0 standard drinks    Comment: Rare  . Drug use: No  . Sexual activity: Not Currently    Birth control/protection: Surgical    Comment: 1st intercourse 81 yo-2 partners  Other Topics Concern  . Not on file  Social History Narrative   No regular exercise-used to do [place] will.. She is DNR, [name] is husband [name] , then son [name]       10/22/12-cardiac work up found the reason for her dizziness,  she needs no neurological follow up for that.  her enthusiasm for CPAP remains limited and is explained by her resiudal high AHI after she finally reached compliance ,[place] 13 points.     95% pressure is 11.7 cm , AHI  20 .7- she does not benefit from CPAP . She is allowed to d/c the machine and we need to concerntrate on her dizziness and facial right droop. ordered MRI brain repeat.    Eye exam next week. PT at [facility]. MRI brain in April- per patients request order next month.          Social Determinants of Health   Financial Resource Strain:   . Difficulty of Paying Living Expenses:   Food Insecurity:   . Worried About Sales executive in the Last Year:   . Arboriculturist in the Last Year:   Transportation Needs:   . Film/video editor (Medical):   [name] Lack of Transportation (Non-Medical):   Physical Activity:   . Days of Exercise per Week:   . Minutes of Exercise per Session:   Stress:   . Feeling of Stress :  Social Connections: Somewhat Isolated  . Frequency of Communication with Friends and Family: More than three times a week  . Frequency of Social Gatherings with Friends and Family: Once a week  . Attends Religious Services: Never  . Active Member of Clubs or Organizations: No  . Attends Archivist Meetings: Never  . Marital Status: Married    Outpatient Encounter Medications as of 11/04/2019  Medication Sig  . acetaminophen (TYLENOL) 325 MG tablet Take 325 mg by mouth every 8 (eight) hours as needed (pain).   . ASPIRIN 81 PO Take 1 tablet by mouth daily.  . Calcium Carbonate-Vitamin D (CALTRATE 600+D PO) Take 1 tablet by mouth 2 (two) times daily.   [name] diltiazem (CARDIZEM CD) 240 MG 24 hr capsule Take 1 capsule (240 mg total) by mouth daily.  . Ibuprofen (MOTRIN PO) Take 1 tablet by mouth at bedtime.  . meclizine (ANTIVERT) 25 MG tablet   . memantine (NAMENDA) 10 MG tablet Take 1 tablet at bedtime for 2 weeks and then increase to 1 tablet 2x a day.  . Multiple Vitamin (MULTIVITAMIN) tablet Take 1 tablet by mouth daily.    . nitroGLYCERIN (NITROSTAT) 0.4 MG SL tablet Place 1 tablet (0.4 mg total) under the tongue every 5 (five) minutes as needed for chest pain.  . NON FORMULARY 3,000 mg. CBD Tincture  . Omega-3 Fatty Acids (FISH OIL) 1200 MG CAPS Take 1 capsule by mouth 2 (two) times daily.   Vladimir Faster Glycol-Propyl Glycol (SYSTANE) 0.4-0.3 % SOLN Apply 1 drop to eye daily as needed (dry eyes).   . Probiotic Product ([place]) CAPS Take 1 capsule by mouth daily.  . traMADol (ULTRAM) 50 MG tablet TAKE ONE TABLET (50 MG) BY MOUTH EVERY 12 HOURS AS NEEDED FOR MODERATE PAIN  .  conjugated estrogens (PREMARIN) vaginal cream Place 1 Applicatorful vaginally daily. Use pea sized amount M-W-Fr before bedtime (Patient not taking: Reported on 11/03/2019)   No facility-administered encounter medications on file as of 11/04/2019.    Activities of Daily Living In your present state of health, do you have any difficulty performing the following activities: 11/04/2019 03/04/2019  Hearing? N N  Comment no hearing aids -  Vision? N Y  Comment reading glasses, Dr.Porfilio -  Difficulty concentrating or making decisions? Tempie Donning  Walking or climbing stairs? N N  Dressing or bathing? N N  Doing errands, shopping? N N  Preparing Food and eating ? N -  Using the Toilet? N -  In the past six months, have you accidently leaked urine? N -  Do you have problems with loss of bowel control? N -  Managing your Medications? Y -  Comment husband manages -  Managing your Finances? Y -  Comment husband manages -  [place], broadcasting/film/video? N -  Some recent data might be hidden    Patient Care Team: Malfi, [name], FNP as PCP - General (Family Medicine) Wellington Hampshire, MD as PCP - Cardiology (Cardiology) Wellington Hampshire, MD as Consulting Physician (Cardiology) Birder Robson, MD as Referring Physician (Ophthalmology) Oneta Rack, MD as Consulting Physician (Dermatology) Valinda Party, MD as Consulting Physician (Rheumatology) Vicenta Aly, [place] (Chiropractic Medicine)    Assessment:   This is a routine wellness examination for [place].  Exercise Activities and Dietary recommendations Current Exercise Habits: The patient does not participate in regular exercise at present, Exercise limited by: None identified  Goals Addressed   None  Fall Risk: Fall Risk  11/04/2019 10/06/2019 09/25/2018 02/21/2018 02/19/2017  Falls in the past year? 0 0 0 No No  Number falls in past yr: 0 0 - - -  Injury with Fall? 0 - - - -  Risk Factor Category  - - - - -    Risk for fall due to : - No Fall Risks - - -  Risk for fall due to: Comment - - - - -  Follow up - - Falls evaluation completed - -    FALL RISK PREVENTION PERTAINING TO THE HOME:  Any stairs in or around the home? Yes  If so, are there any without handrails? No   Home free of loose throw rugs in walkways, pet beds, electrical cords, etc? Yes  Adequate lighting in your home to reduce risk of falls? Yes   ASSISTIVE DEVICES UTILIZED TO PREVENT FALLS:  Life alert? No  Use of a cane, walker or w/c? No  Grab bars in the bathroom? No  Shower chair or bench in shower? No  Elevated toilet seat or a handicapped toilet? No   DME ORDERS:  DME order needed?  No   TIMED UP AND GO:  Unable to perform    Depression Screen PHQ 2/9 Scores 11/04/2019 03/04/2019 09/25/2018 02/21/2018  PHQ - 2 Score 0 0 - 0  PHQ- 9 Score - - - 0  Exception Documentation - - Patient refusal -     Cognitive Function MMSE - Mini Mental State Exam 07/02/2018 02/21/2018 02/19/2017 01/28/2016  Orientation to time 2 5 3 5   Orientation to time comments - - Does not know date or day of the week. -  Orientation to Place 5 5 5 5   Registration 3 3 3 3   Attention/ Calculation 0 0 0 0  Recall 0 0 0 0  Recall-comments - unable to recall 3 of 3 words - pt was unable to recall 3 of 3 words  Language- name 2 objects 2 0 0 0  Language- repeat 1 1 1 1   Language- follow 3 step command 3 3 3 3   Language- read & follow direction 1 0 0 0  Write a sentence 1 0 0 0  Copy design 1 0 0 0  Total score 19 17 15 17      6CIT Screen 06/03/2019  What Year? 0 points  What month? 3 points  What time? 3 points  Count back from 20 2 points  Months in reverse 4 points  Repeat phrase 10 points  Total Score 22    Immunization History  Administered Date(s) Administered  . Fluad Quad(high Dose 65+) 06/03/2019  . Influenza Split 05/16/2012  . Influenza Whole 05/15/2007, 05/20/2008, 05/14/2009, 05/14/2010  . Influenza, High Dose  Seasonal PF 05/11/2018  . Influenza,inj,Quad PF,6+ Mos 05/13/2016, 06/12/2017  . Influenza-Unspecified 05/14/2013, 06/06/2014  . PFIZER SARS-COV-2 Vaccination 09/10/2019, 10/01/2019  . Pneumococcal Conjugate-13 10/17/2013  . Pneumococcal Polysaccharide-23 08/14/2005, 02/19/2017  . Td 08/14/2002  . Zoster 08/14/2005, 05/18/2008    Qualifies for Shingles Vaccine? Yes  Zostavax completed n/a. Due for Shingrix. Education has been provided regarding the importance of this vaccine. Pt has been advised to call insurance company to determine out of pocket expense. Advised may also receive vaccine at local pharmacy or Health Dept. Verbalized acceptance and understanding.  Tdap: up to date .  Flu Vaccine: up to date   Pneumococcal Vaccine: up to date    Covid-19 Vaccine: Completed vaccines  Screening Tests Health Maintenance  Topic Date Due  . TETANUS/TDAP  05/28/2025  . INFLUENZA VACCINE  Completed  . DEXA SCAN  Completed  . PNA vac Low Risk Adult  Completed    Cancer Screenings:  Colorectal Screening: completed   Mammogram: Completed 04/08/2019. Repeat every year  Bone Density: up to date   Lung Cancer Screening: (Low Dose CT Chest recommended if Age 54-80 years, 30 pack-year currently smoking OR have quit w/in 15years.) does not qualify.    Additional Screening:  Hepatitis C Screening: does not qualify  Vision Screening: Recommended annual ophthalmology exams for early detection of glaucoma and other disorders of the eye. Is the patient up to date with their annual eye exam?  Yes  Who is the provider or what is the name of the office in which the pt attends annual eye exams? Dr.Porfilio    Dental Screening: Recommended annual dental exams for proper oral hygiene  Community Resource Referral:  CRR required this visit?  No       Plan:  I have personally reviewed and addressed the Medicare Annual Wellness questionnaire and have noted the following in the patient's chart:   A. Medical and social history B. Use of alcohol, tobacco or illicit drugs  C. Current medications and supplements D. Functional ability and status E.  Nutritional status F.  Physical activity G. Advance directives H. List of other physicians I.  Hospitalizations, surgeries, and ER visits in previous 12 months J.  [place] such as hearing and vision if needed, cognitive and depression L. Referrals and appointments   In addition, I have reviewed and discussed with patient certain preventive protocols, quality metrics, and best practice recommendations. A written personalized care plan for preventive services as well as general preventive health recommendations were provided to patient.  Signed,    Bevelyn Ngo, LPN  [phone] Nurse Health Advisor   Nurse Notes: none

## 2019-11-05 NOTE — Progress Notes (Signed)
Urine culture not showing a urinary tract infection, labs are showing some dehydration and slightly elevated cholesterol labs, but those are similar to the last set of cholesterol labs completed the last few years.  Have they scheduled an appointment with Neurology or for the Brain MRI yet?  Thanks

## 2019-11-17 ENCOUNTER — Telehealth: Payer: Self-pay | Admitting: Diagnostic Neuroimaging

## 2019-11-17 ENCOUNTER — Other Ambulatory Visit: Payer: Self-pay | Admitting: Family Medicine

## 2019-11-17 DIAGNOSIS — R413 Other amnesia: Secondary | ICD-10-CM

## 2019-11-17 NOTE — Telephone Encounter (Signed)
Called husband, Darnell Level and informed him when she saw Dr Leta Baptist in Nov 2019 Dr [facility] didn't order or mention MRI in his note. He stated "well that's good", verbalized understanding, appreciation.

## 2019-11-17 NOTE — Telephone Encounter (Signed)
Pt's husband called wanting to know if the MRI suggestion has been dismissed or will they be able to get that scheduled. Please advise.

## 2019-11-27 ENCOUNTER — Ambulatory Visit: Payer: PPO | Admitting: Licensed Clinical Social Worker

## 2019-11-27 DIAGNOSIS — R413 Other amnesia: Secondary | ICD-10-CM

## 2019-11-27 NOTE — Chronic Care Management (AMB) (Signed)
  Care Management   Follow Up Note   11/27/2019 Name: Kristin [name] MRN: [id number] DOB: [birth year]  Referred by: Verl Bangs, FNP Reason for referral : Care Coordination   Kristin [name] is a 79 y.o. year old female who is a primary care patient of [name], [name], FNP. The care management team was consulted for assistance with care management and care coordination needs.    Review of patient status, including review of consultants reports, relevant laboratory and other test results, and collaboration with appropriate care team members and the patient's provider was performed as part of comprehensive patient evaluation and provision of chronic care management services.    LCSW received new CCM referral from PCP for memory loss and contacted family successfully on 11/27/19. HIPPA verifications received. Spouse/caregiver denies needing CCM program at this time but was very appreciative of call. He denies needing any community resource connection regarding in home support at this time but was appreciative of phone call and brief pcs education that was provided.   The care management team is available to follow up with the patient after provider conversation with the patient regarding recommendation for care management engagement and subsequent re-referral to the care management team.   [name], BSW, MSW, [email or web address] Phone: [phone]

## 2019-12-08 DIAGNOSIS — L853 Xerosis cutis: Secondary | ICD-10-CM | POA: Diagnosis not present

## 2019-12-09 ENCOUNTER — Other Ambulatory Visit: Payer: Self-pay | Admitting: Family Medicine

## 2019-12-09 ENCOUNTER — Telehealth: Payer: Self-pay

## 2019-12-09 DIAGNOSIS — R413 Other amnesia: Secondary | ICD-10-CM

## 2019-12-09 NOTE — Telephone Encounter (Signed)
Copied from [place] [phone]. Topic: General - Other >> Dec 08, 2019  4:11 PM Leward Quan A wrote: Reason for CRM: Patient husband called to say that he would like Cyndia Skeeters to please find a place where his wife can go a few days a week while he run errands. He states he would rather her go somewhere than having an aide coming to the house. Ph# [phone]

## 2019-12-09 NOTE — Progress Notes (Signed)
CCM referral placed °

## 2019-12-09 NOTE — Telephone Encounter (Signed)
I can put in a CCM referral and they can work on resources locally in the community.  I had put in a referral to CCM last that I had met with them in the office and once the initial call was made they declined CCM services.  Can you find out if he is agreeable to CCM services?  I can put a new referral in and they can help with caregiver services.  Thanks

## 2019-12-09 NOTE — Telephone Encounter (Signed)
The patient husband would like to proceed with CCM services. I informed him that they will be calling him to address his concern.

## 2019-12-10 ENCOUNTER — Ambulatory Visit (INDEPENDENT_AMBULATORY_CARE_PROVIDER_SITE_OTHER): Payer: PPO | Admitting: Pharmacist

## 2019-12-10 DIAGNOSIS — I25111 Atherosclerotic heart disease of native coronary artery with angina pectoris with documented spasm: Secondary | ICD-10-CM | POA: Diagnosis not present

## 2019-12-10 DIAGNOSIS — I10 Essential (primary) hypertension: Secondary | ICD-10-CM

## 2019-12-10 DIAGNOSIS — R413 Other amnesia: Secondary | ICD-10-CM

## 2019-12-10 NOTE — Chronic Care Management (AMB) (Signed)
Chronic Care Management   Note  12/10/2019 Name: Kristin [name] MRN: [id number] DOB: 05-Feb-1941   Subjective:   Kristin [name] is a 79 y.o. year old female who is a primary care patient of [name], [name], FNP. The CM team was consulted for assistance with chronic disease management and care coordination.   I reached out to patient's spouse, [name], by phone today. Note Mr. Midgley is listed on patient's DPR in chart.  Patient's spouse was given information about Chronic Care Management services today including:  1. CCM service includes personalized support from designated clinical staff supervised by her physician, including individualized plan of care and coordination with other care providers 2. 24/7 contact phone numbers for assistance for urgent and routine care needs. 3. Service will only be billed when office clinical staff spend 20 minutes or more in a month to coordinate care. 4. Only one practitioner may furnish and bill the service in a calendar month. 5. The patient may stop CCM services at any time (effective at the end of the month) by phone call to the office staff. 6. The patient will be responsible for cost sharing (co-pay) of up to 20% of the service fee (after annual deductible is met).  Spuse agreed to services and verbal consent obtained.   Review of patient status, including review of consultants reports, laboratory and other test data, was performed as part of comprehensive evaluation and provision of chronic care management services.   Objective:  Lab Results  Component Value Date   CREATININE 1.21 (H) 11/03/2019   CREATININE 0.88 06/14/2018   CREATININE 0.85 02/21/2018    No results found for: HGBA1C     Component Value Date/Time   CHOL 227 (H) 11/03/2019 0941   TRIG 134 11/03/2019 0941   HDL 65 11/03/2019 0941   CHOLHDL 3.5 11/03/2019 0941   VLDL 21.2 02/21/2018 1035   LDLCALC 136 (H) 11/03/2019 0941   LDLDIRECT 158.1 10/02/2013 0914       BP Readings from Last 3 Encounters:  11/03/19 (!) 133/53  10/06/19 (!) 132/52  07/31/19 130/78    Allergies  Allergen Reactions  . Angiotensin Receptor Blockers Anaphylaxis    Fatigue & Dizziness  . Other Anaphylaxis    Other reaction(s): Other (See Comments) Dizziness, rash Fatigue & Dizziness  . Hydralazine Other (See Comments)    Unknown  . Latex     REACTION: Rash, blisters, breathing probs.  . Losartan     Dizziness   . Methotrexate Derivatives     Dizziness, rash   . Spironolactone     Dizziness   . Penicillins Hives and Rash    Medications Reviewed Today    Reviewed by Vella Raring, [facility] (Pharmacist) on 12/10/19 at [place] List Status: <None>  Medication Order Taking? Sig Documenting Provider Last Dose Status Informant  acetaminophen (TYLENOL) 325 MG tablet [id number] Yes Take 325 mg by mouth every 8 (eight) hours as needed (pain).  [provider] Taking Active Self  ASPIRIN 81 PO [id number] Yes Take 1 tablet by mouth daily. [provider] Taking Active Self  Calcium Carbonate-Vitamin D (CALTRATE 600+D PO) [id number] Yes Take 1 tablet by mouth daily.  [provider] Taking Active Self  conjugated estrogens (PREMARIN) vaginal cream [id number] No Place 1 Applicatorful vaginally daily. Use pea sized amount M-W-Fr before bedtime  Patient not taking: Reported on 11/03/2019   Hollice Espy, MD Not Taking Active   diltiazem (CARDIZEM CD) 240 MG 24 hr  capsule [id number] Yes Take 1 capsule (240 mg total) by mouth daily. Loel Dubonnet, NP Taking Active         Discontinued 12/10/19 1555 (No longer needed (for PRN medications))   memantine (NAMENDA) 10 MG tablet [id number] No TAKE 1 TABLET TWICE DAILY  Patient not taking: Reported on 12/10/2019   Verl Bangs, FNP Not Taking Active   Multiple Vitamin (MULTIVITAMIN) tablet [id number] Yes Take 1 tablet by mouth daily.   [provider] Taking Active Self  nitroGLYCERIN (NITROSTAT)  0.4 MG SL tablet [id number] Yes Place 1 tablet (0.4 mg total) under the tongue every 5 (five) minutes as needed for chest pain. Loel Dubonnet, NP Taking Active         Discontinued 12/10/19 1558 (No longer needed (for PRN medications))   Omega-3 Fatty Acids (FISH OIL) 1200 MG CAPS [id number] Yes Take 1 capsule by mouth daily.  [provider] Taking Active Self  Polyethyl Glycol-Propyl Glycol (SYSTANE) 0.4-0.3 % SOLN [id number] Yes Apply 1 drop to eye daily as needed (dry eyes).  [provider] Taking Active Self        Discontinued 12/10/19 1559 (Patient Preference)   traMADol (ULTRAM) 50 MG tablet [id number] Yes TAKE ONE TABLET (50 MG) BY MOUTH EVERY 12 HOURS AS NEEDED FOR MODERATE PAIN Mikey [employer], NP Taking Active            Assessment:   Goals Addressed            This Visit's Progress   . PharmD - Medication Review       CARE PLAN ENTRY (see longitudinal plan of care for additional care plan information)   Current Barriers:  . 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 Inter-disciplinary care team collaboration (see longitudinal plan of care) . Spouse/caregiver reports that he is currently home with patient throughout day. 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  o Note PCP placed re-referral to [place] to reach out to patient regarding daytime care for patient. . Comprehensive medication review performed; medication list updated in electronic medical record o Spouse reports that he manages patient's  medications and fills weekly pillbox o However, reports patient has been managing tramadol Rx herself, taking as needed from bottle.  - Review refill data for tramadol. Note Rx latest refill on 11/24/19 for 23 day supply, previous refill 06/02/19 for 23 day supply - Caution about risk of dizziness/sedation with tramadol o Encourage spouse to help patient with management of as needed medications as well due to memory concerns o Reports recently stopped patient's memantine as patient wondered if this was causing dizziness. Denies himself or patient noticing an improvement in dizziness with discontinuing.  - Planning to discuss further with Neurologist at upcoming appointment. . Patient with history of stroke not currently on statin. Will collaborate with PCP. o Per chart review, patient has declined statin in the past. . Discuss importance of BP control and monitoring o Reports patient taking: diltiazem CD 240 mg once daily o Encourage caregiver to check BP with patient 1-2 x per week  at home, keep log, and bring to medical appointments o Mr. Grout denies having been checking BP, but states that they will start doing this together . Encourage caregiver/patient for patient to stay hydrated . Will mail patient/caregiver BP log as requested.  Patient Self Care Activities:  . UNABLE to self-administer medications as prescribed. Spouse fills weekly pillbox for patient . Attends all scheduled provider appointments with help of spouse  Initial goal documentation        Plan:  The care management team will reach out to the patient again over the next 30 days.   Harlow Asa, PharmD, [place] [employer] [phone]

## 2019-12-10 NOTE — Patient Instructions (Signed)
Thank you allowing the Chronic Care Management Team to be a part of your care! It was a pleasure speaking with you today!     CCM (Chronic Care Management) Team    Noreene Larsson RN, MSN, CCM Nurse Care Coordinator  [phone]   Harlow Asa PharmD  Clinical Pharmacist  [phone]   [name] LCSW Clinical Social Worker [phone]  Visit Information  Goals Addressed            This Visit's Progress   . PharmD - Medication Review       CARE PLAN ENTRY (see longitudinal plan of care for additional care plan information)   Current Barriers:  . 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 Inter-disciplinary care team collaboration (see longitudinal plan of care) . Spouse/caregiver reports that he is currently home with patient throughout day. 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  o Note PCP placed re-referral to [place] to reach out to patient regarding daytime care for patient. . Comprehensive medication review performed; medication list updated in electronic medical record o Spouse reports that he manages patient's medications and fills weekly pillbox o However, reports patient has been managing tramadol Rx herself, taking as needed from bottle.  - Review refill data for tramadol. Note Rx latest refill on 11/24/19 for 23 day supply, previous refill 06/02/19 for 23 day supply - Caution about risk of dizziness/sedation with tramadol o Encourage spouse to help patient with management of as needed medications as well due to  memory concerns o Reports recently stopped patient's memantine as patient wondered if this was causing dizziness. Denies himself or patient noticing an improvement in dizziness with discontinuing.  - Planning to discuss further with Neurologist at upcoming appointment. . Patient with history of stroke not currently on statin. Will collaborate with PCP. o Per chart review, patient has declined statin in the past. . Discuss importance of BP control and monitoring o Reports patient taking: diltiazem CD 240 mg once daily o Encourage caregiver to check BP with patient 1-2 x per week at home, keep log, and bring to medical appointments o Mr. Massoud denies having been checking BP, but states that they will start doing this together . Encourage caregiver/patient for patient to stay hydrated . Will mail patient/caregiver BP log as requested.  Patient Self Care Activities:  . UNABLE to self-administer medications as prescribed. Spouse fills weekly pillbox for patient . Attends all scheduled provider appointments with help of spouse  Initial goal documentation        Patient's spouse was given information about Chronic Care Management services today including:  1. CCM service includes personalized support from designated clinical staff supervised by her physician, including individualized plan of care and coordination with other care providers 2. 24/7 contact phone numbers for assistance for urgent and routine care needs. 3. Service will only be billed when office clinical staff spend 20 minutes or more in a month to coordinate care. 4. Only one practitioner may furnish and bill the service in a calendar month. 5. The patient may stop CCM services at  any time (effective at the end of the month) by phone call to the office staff. 6. The patient will be responsible for cost sharing (co-pay) of up to 20% of the service fee (after annual deductible is met).  Spouse agreed to services and verbal consent  obtained.   Spouse verbalizes understanding of instructions provided today.   The care management team will reach out to the patient again over the next 30 days.   Harlow Asa, PharmD, [place] [employer] [phone]

## 2019-12-10 NOTE — Chronic Care Management (AMB) (Deleted)
Chronic Care Management   Follow Up Note   12/10/2019 Name: Kristin [name] MRN: [id number] DOB: 12/11/1940  Referred by: Verl Bangs, FNP Reason for referral : Chronic Care Management (Caregiver Initial Phone Call)   Kristin [name] is a 79 y.o. year old female who is a primary care patient of Verl Bangs, FNP. The CCM team was consulted for assistance with chronic disease management and care coordination needs.    I reached out to patient's spouse, Kristin [name], by phone today. Note Mr. Jose is listed on patient's DPR in chart.  Review of patient status, including review of consultants reports, relevant laboratory and other test results, and collaboration with appropriate care team members and the patient's provider was performed as part of comprehensive patient evaluation and provision of chronic care management services.     Outpatient Encounter Medications as of 12/10/2019  Medication Sig  . acetaminophen (TYLENOL) 325 MG tablet Take 325 mg by mouth every 8 (eight) hours as needed (pain).   . ASPIRIN 81 PO Take 1 tablet by mouth daily.  . Calcium Carbonate-Vitamin D (CALTRATE 600+D PO) Take 1 tablet by mouth daily.   [name] diltiazem (CARDIZEM CD) 240 MG 24 hr capsule Take 1 capsule (240 mg total) by mouth daily.  . Multiple Vitamin (MULTIVITAMIN) tablet Take 1 tablet by mouth daily.    . nitroGLYCERIN (NITROSTAT) 0.4 MG SL tablet Place 1 tablet (0.4 mg total) under the tongue every 5 (five) minutes as needed for chest pain.  . Omega-3 Fatty Acids (FISH OIL) 1200 MG CAPS Take 1 capsule by mouth daily.   Vladimir Faster Glycol-Propyl Glycol (SYSTANE) 0.4-0.3 % SOLN Apply 1 drop to eye daily as needed (dry eyes).   . traMADol (ULTRAM) 50 MG tablet TAKE ONE TABLET (50 MG) BY MOUTH EVERY 12 HOURS AS NEEDED FOR MODERATE PAIN  . conjugated estrogens (PREMARIN) vaginal cream Place 1 Applicatorful vaginally daily. Use pea sized amount M-W-Fr before bedtime (Patient not taking: Reported on  11/03/2019)  . memantine (NAMENDA) 10 MG tablet TAKE 1 TABLET TWICE DAILY (Patient not taking: Reported on 12/10/2019)   No facility-administered encounter medications on file as of 12/10/2019.    Goals Addressed            This Visit's Progress   . PharmD - Medication Review       CARE PLAN ENTRY (see longitudinal plan of care for additional care plan information)   Current Barriers:  . 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 Inter-disciplinary care team collaboration (see longitudinal plan of care) . Spouse/caregiver reports that he is currently home with patient throughout day. 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  o Note PCP placed re-referral to [place] to reach out to patient regarding daytime care for patient. . Comprehensive medication review performed; medication list updated in electronic medical record o Spouse reports that he manages patient's medications and fills weekly pillbox o However, reports patient has been managing tramadol Rx herself, taking as needed from bottle.  - Review refill data for tramadol. Note Rx latest refill on  11/24/19 for 23 day supply, previous refill 06/02/19 for 23 day supply - Caution about risk of dizziness/sedation with tramadol o Encourage spouse to help patient with management of as needed medications as well due to memory concerns o Reports recently stopped patient's memantine as patient wondered if this was causing dizziness. Denies himself or patient noticing an improvement in dizziness with  discontinuing.  - Planning to discuss further with Neurologist at upcoming appointment. . Note patient not currently on statin. Will collaborate with PCP. o Per chart review, patient has declined statin in the past. . Discuss importance of BP control and monitoring o Reports patient taking: diltiazem CD 240 mg once daily o Encourage caregiver to check BP with patient 1-2 x per week at home, keep log, and bring to medical appointments o Mr. Carson denies having been checking BP, but states that they will start doing this together . Encourage caregiver/patient for patient to stay hydrated . Will mail patient/caregiver BP log as requested.  Patient Self Care Activities:  . UNABLE to self-administer medications as prescribed. Spouse fills weekly pillbox for patient . Attends all scheduled provider appointments with help of spouse  Initial goal documentation        Plan  The care management team will reach out to the patient again over the next 30 days.   Harlow Asa, PharmD, [place] [employer] [phone]

## 2019-12-16 ENCOUNTER — Ambulatory Visit: Payer: PPO

## 2019-12-16 NOTE — Chronic Care Management (AMB) (Signed)
  Care Management   Follow Up Note   12/16/2019 Name: JAMETTA VEEDER MRN: [id number] DOB: May 16, 1941  Referred by: Verl Bangs, FNP Reason for referral : Care Coordination   FOX DALLY is a 79 y.o. year old female who is a primary care patient of [name], [name], FNP. The care management team was consulted for assistance with care management and care coordination needs.    Review of patient status, including review of consultants reports, relevant laboratory and other test results, and collaboration with appropriate care team members and the patient's provider was performed as part of comprehensive patient evaluation and provision of chronic care management services.    LCSW completed CCM outreach attempt today but was unable to reach patient successfully. A HIPPA compliant voice message was left encouraging patient to return call once available. LCSW rescheduled CCM SW appointment as well.  A HIPPA compliant phone message was left for the patient providing contact information and requesting a return call.   [name], BSW, MSW, [email or web address] Phone: [phone]

## 2019-12-18 ENCOUNTER — Telehealth: Payer: PPO | Admitting: General Practice

## 2019-12-18 ENCOUNTER — Ambulatory Visit: Payer: PPO | Admitting: Podiatry

## 2019-12-18 ENCOUNTER — Ambulatory Visit (INDEPENDENT_AMBULATORY_CARE_PROVIDER_SITE_OTHER): Payer: PPO | Admitting: General Practice

## 2019-12-18 DIAGNOSIS — R42 Dizziness and giddiness: Secondary | ICD-10-CM

## 2019-12-18 DIAGNOSIS — I10 Essential (primary) hypertension: Secondary | ICD-10-CM

## 2019-12-18 DIAGNOSIS — R413 Other amnesia: Secondary | ICD-10-CM

## 2019-12-18 DIAGNOSIS — E782 Mixed hyperlipidemia: Secondary | ICD-10-CM

## 2019-12-18 NOTE — Chronic Care Management (AMB) (Signed)
Chronic Care Management   Follow Up Note   12/18/2019 Name: Kristin [name] MRN: [id number] DOB: [birth year]  Referred by: Verl Bangs, FNP Reason for referral : Chronic Care Management (HTN, [name], Memory Loss- care coordination needs)   Kristin [name] is a 79 y.o. year old female who is a primary care patient of Verl Bangs, FNP. The CCM team was consulted for assistance with chronic disease management and care coordination needs.    Review of patient status, including review of consultants reports, relevant laboratory and other test results, and collaboration with appropriate care team members and the patient's provider was performed as part of comprehensive patient evaluation and provision of chronic care management services.    SDOH (Social Determinants of Health) assessments performed: Yes See Care Plan activities for detailed interventions related to SDOH)  SDOH Interventions     Most Recent Value  SDOH Interventions  SDOH Interventions for the Following Domains  Physical Activity, Social Connections  Physical Activity Interventions  Other (Comments) [was going to the [facility] before COVID but does not do structured activity at home]  Social Connections Interventions  Other (Comment) [good social support system- talks to family on a regular basis- good support system]       Outpatient Encounter Medications as of 12/18/2019  Medication Sig  . acetaminophen (TYLENOL) 325 MG tablet Take 325 mg by mouth every 8 (eight) hours as needed (pain).   . ASPIRIN 81 PO Take 1 tablet by mouth daily.  . Calcium Carbonate-Vitamin D (CALTRATE 600+D PO) Take 1 tablet by mouth daily.   [name] conjugated estrogens (PREMARIN) vaginal cream Place 1 Applicatorful vaginally daily. Use pea sized amount M-W-Fr before bedtime (Patient not taking: Reported on 11/03/2019)  . diltiazem (CARDIZEM CD) 240 MG 24 hr capsule Take 1 capsule (240 mg total) by mouth daily.  . memantine (NAMENDA) 10 MG tablet TAKE 1  TABLET TWICE DAILY (Patient not taking: Reported on 12/10/2019)  . Multiple Vitamin (MULTIVITAMIN) tablet Take 1 tablet by mouth daily.    . nitroGLYCERIN (NITROSTAT) 0.4 MG SL tablet Place 1 tablet (0.4 mg total) under the tongue every 5 (five) minutes as needed for chest pain.  . Omega-3 Fatty Acids (FISH OIL) 1200 MG CAPS Take 1 capsule by mouth daily.   Vladimir Faster Glycol-Propyl Glycol (SYSTANE) 0.4-0.3 % SOLN Apply 1 drop to eye daily as needed (dry eyes).   . traMADol (ULTRAM) 50 MG tablet TAKE ONE TABLET (50 MG) BY MOUTH EVERY 12 HOURS AS NEEDED FOR MODERATE PAIN   No facility-administered encounter medications on file as of 12/18/2019.     Objective:   Goals Addressed            This Visit's Progress   . RNCM: pt:"I get dizzy and loopy a lot" (pt-stated)       CARE PLAN ENTRY (see longitudinal plan of care for additional care plan information)  Current Barriers:  [name] Knowledge Deficits related to resources in the community to help in the home during the day while the patient's husband runs errands and does volunteer work . Care Coordination needs related to resources for services to help  in a patient with memory loss and dizziness  (disease states) . Chronic Disease Management support and education needs related to memory loss and mental capacity functions . Lacks caregiver support.   Nurse Case Manager Clinical Goal(s):  [name] Over the next 120 days, patient will verbalize understanding of plan for finding resources in the community to  help with patient needs when the patient's husband is running errands or doing volunteer work . Over the next 120 days, patient will work with [facility], [place] team and pcp to address needs related to patients needs related to decline in mental capacity and memory loss . Over the next 120 days, patient will work with care guides (community agency) to have available resources to use in the community to assist with the needs of the patient with memory  loss  Interventions:  . Inter-disciplinary care team collaboration (see longitudinal plan of care) . Advised patient to call the pcp for changes in memory or worsening s/s of dizziness  . Provided education to patient re: working with the patient and her husband to help with resources for assistance in the home when her husband is running errands or doing volunteer work . Evaluation of current routine. The patient states that she does well right now when her husband is out running errands. The patient likes to knit and do crafts. The patient does have issues with memory loss and dizziness but endorses being safe at home and no falls. The patient states she can get "loopy" sometimes. The patient says her husband is concerned about her when he is away. Is open to resources in the community. Before COVID19 she was going to the [facility] and a senior center enjoying knitting.  She is hopeful to get back to something like this when "things open up".  She says they are planning a trip to [place] soon to visit with family and then there is a trip in June but she told her husband she did not think she needed to go. She says he wants her to go. The patient wants to be independent and continue to enjoy the things that she does.  Nash Dimmer with CCM team, pcp and care guides regarding available resources to help in the home or day cares in the area to help with patient with decline in memory . Discussed plans with patient for ongoing care management follow up and provided patient with direct contact information for care management team . Care Guide referral for resources in the community to help with adult day care resources or in home services to help when the patients husband is out doing errands or volunteer work.   Patient Self Care Activities:  . Patient verbalizes understanding of plan to have CCM team work with her and her husband to help find resources in the community to provide adult day care options or in  home day care help . Attends all scheduled provider appointments . Performs ADL's independently . Calls provider office for new concerns or questions . Unable to independently manage care during the day when the patients husband is out running errands or doing volunteer work as evidence of patient having dizziness and memory loss.   Initial goal documentation     . RNCM:"I don't check my blood pressure a lot" (pt-stated)       CARE PLAN ENTRY (see longtitudinal plan of care for additional care plan information)  Current Barriers:  . Chronic Disease Management support, education, and care coordination needs related to HTN and HLD  Clinical Goal(s) related to HTN and HLD:  Over the next 120 days, patient will:  . Work with the care management team to address educational, disease management, and care coordination needs  . Begin or continue self health monitoring activities as directed today Measure and record blood pressure 2 times per week and adhere to a  heart healthy diet . Call provider office for new or worsened signs and symptoms Blood pressure findings outside established parameters and New or worsened symptom related to HTN/HLD and other chronic conditions . Call care management team with questions or concerns . Verbalize basic understanding of patient centered plan of care established today  Interventions related to HTN and HLD:  . Evaluation of current treatment plans and patient's adherence to plan as established by provider . Assessed patient understanding of disease states . Assessed patient's education and care coordination needs . Provided disease specific education to patient.  The patient states she will check her blood pressure when she goes to the pharmacist and it is usually good. Ask the patient about checking the blood pressure at home and writing down values to have for the next call. Will discuss further with the patients husband at next outreach.  Nash Dimmer with  appropriate clinical care team members regarding patient needs  Patient Self Care Activities related to HTN and HLD:  . Patient is unable to independently self-manage chronic health conditions  Initial goal documentation         Plan:   The care management team will reach out to the patient again over the next 30 to 60 days.    Noreene Larsson RN, MSN, [place]: [phone]

## 2019-12-18 NOTE — Patient Instructions (Signed)
Visit Information  Goals Addressed            This Visit's Progress   . RNCM: pt:"I get dizzy and loopy a lot" (pt-stated)       CARE PLAN ENTRY (see longitudinal plan of care for additional care plan information)  Current Barriers:  [name] Knowledge Deficits related to resources in the community to help in the home during the day while the patient's husband runs errands and does volunteer work . Care Coordination needs related to resources for services to help  in a patient with memory loss and dizziness  (disease states) . Chronic Disease Management support and education needs related to memory loss and mental capacity functions . Lacks caregiver support.   Nurse Case Manager Clinical Goal(s):  [name] Over the next 120 days, patient will verbalize understanding of plan for finding resources in the community to help with patient needs when the patient's husband is running errands or doing volunteer work . Over the next 120 days, patient will work with [facility], [place] team and pcp to address needs related to patients needs related to decline in mental capacity and memory loss . Over the next 120 days, patient will work with care guides (community agency) to have available resources to use in the community to assist with the needs of the patient with memory loss  Interventions:  . Inter-disciplinary care team collaboration (see longitudinal plan of care) . Advised patient to call the pcp for changes in memory or worsening s/s of dizziness  . Provided education to patient re: working with the patient and her husband to help with resources for assistance in the home when her husband is running errands or doing volunteer work . Evaluation of current routine. The patient states that she does well right now when her husband is out running errands. The patient likes to knit and do crafts. The patient does have issues with memory loss and dizziness but endorses being safe at home and no falls. The patient states  she can get "loopy" sometimes. The patient says her husband is concerned about her when he is away. Is open to resources in the community. Before COVID19 she was going to the [facility] and a senior center enjoying knitting.  She is hopeful to get back to something like this when "things open up".  She says they are planning a trip to [place] soon to visit with family and then there is a trip in June but she told her husband she did not think she needed to go. She says he wants her to go. The patient wants to be independent and continue to enjoy the things that she does.  Nash Dimmer with CCM team, pcp and care guides regarding available resources to help in the home or day cares in the area to help with patient with decline in memory . Discussed plans with patient for ongoing care management follow up and provided patient with direct contact information for care management team . Care Guide referral for resources in the community to help with adult day care resources or in home services to help when the patients husband is out doing errands or volunteer work.   Patient Self Care Activities:  . Patient verbalizes understanding of plan to have CCM team work with her and her husband to help find resources in the community to provide adult day care options or in home day care help . Attends all scheduled provider appointments . Performs ADL's independently . Calls provider office  for new concerns or questions . Unable to independently manage care during the day when the patients husband is out running errands or doing volunteer work as evidence of patient having dizziness and memory loss.   Initial goal documentation     . RNCM:"I don't check my blood pressure a lot" (pt-stated)       CARE PLAN ENTRY (see longtitudinal plan of care for additional care plan information)  Current Barriers:  . Chronic Disease Management support, education, and care coordination needs related to HTN and HLD  Clinical Goal(s)  related to HTN and HLD:  Over the next 120 days, patient will:  . Work with the care management team to address educational, disease management, and care coordination needs  . Begin or continue self health monitoring activities as directed today Measure and record blood pressure 2 times per week and adhere to a heart healthy diet . Call provider office for new or worsened signs and symptoms Blood pressure findings outside established parameters and New or worsened symptom related to HTN/HLD and other chronic conditions . Call care management team with questions or concerns . Verbalize basic understanding of patient centered plan of care established today  Interventions related to HTN and HLD:  . Evaluation of current treatment plans and patient's adherence to plan as established by provider . Assessed patient understanding of disease states . Assessed patient's education and care coordination needs . Provided disease specific education to patient.  The patient states she will check her blood pressure when she goes to the pharmacist and it is usually good. Ask the patient about checking the blood pressure at home and writing down values to have for the next call. Will discuss further with the patients husband at next outreach.  Nash Dimmer with appropriate clinical care team members regarding patient needs  Patient Self Care Activities related to HTN and HLD:  . Patient is unable to independently self-manage chronic health conditions  Initial goal documentation        Patient verbalizes understanding of instructions provided today.   The care management team will reach out to the patient again over the next 30 to 60 days.   Noreene Larsson RN, MSN, [place] Mobile: [phone]

## 2019-12-22 ENCOUNTER — Other Ambulatory Visit: Payer: Self-pay

## 2019-12-22 ENCOUNTER — Encounter: Payer: Self-pay | Admitting: Podiatry

## 2019-12-22 ENCOUNTER — Ambulatory Visit: Payer: PPO | Admitting: Podiatry

## 2019-12-22 VITALS — Temp 97.7°F

## 2019-12-22 DIAGNOSIS — M205X9 Other deformities of toe(s) (acquired), unspecified foot: Secondary | ICD-10-CM

## 2019-12-22 DIAGNOSIS — M0579 Rheumatoid arthritis with rheumatoid factor of multiple sites without organ or systems involvement: Secondary | ICD-10-CM

## 2019-12-22 DIAGNOSIS — M2042 Other hammer toe(s) (acquired), left foot: Secondary | ICD-10-CM

## 2019-12-22 DIAGNOSIS — B351 Tinea unguium: Secondary | ICD-10-CM

## 2019-12-22 DIAGNOSIS — M79676 Pain in unspecified toe(s): Secondary | ICD-10-CM | POA: Diagnosis not present

## 2019-12-22 DIAGNOSIS — M2041 Other hammer toe(s) (acquired), right foot: Secondary | ICD-10-CM

## 2019-12-22 NOTE — Progress Notes (Signed)
This patient returns to my office for at risk foot care.  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  This patient is unable to cut nails herself since the patient cannot reach her nails.These nails are painful walking and wearing shoes.  This patient presents for at risk foot care today. ° °General Appearance  Alert, conversant and in no acute stress. ° °Vascular  Dorsalis pedis and posterior tibial  pulses are palpable  bilaterally.  Capillary return is within normal limits  bilaterally. Temperature is within normal limits  bilaterally. ° °Neurologic  Senn-Weinstein monofilament wire test within normal limits  bilaterally. Muscle power within normal limits bilaterally. ° °Nails Thick disfigured discolored nails with subungual debris  from hallux to fifth toes bilaterally. No evidence of bacterial infection or drainage bilaterally. ° °Orthopedic  No limitations of motion  feet .  No crepitus or effusions noted.  No bony pathology or digital deformities noted. 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  Hammer toes second  B/L. ° °Skin  normotropic skin with no porokeratosis noted bilaterally.  No signs of infections or ulcers noted.    ° °Onychomycosis  Pain in right toes  Pain in left toes ° °Consent was obtained for treatment procedures.   Mechanical debridement of nails 1-5  bilaterally performed with a nail nipper.  Filed with dremel without incident.  ° ° °Return office visit   3 months                   Told patient to return for periodic foot care and evaluation due to potential at risk complications. ° ° °Lemoyne Scarpati DPM  °

## 2019-12-23 ENCOUNTER — Ambulatory Visit: Payer: PPO | Admitting: Licensed Clinical Social Worker

## 2019-12-23 DIAGNOSIS — I10 Essential (primary) hypertension: Secondary | ICD-10-CM

## 2019-12-23 DIAGNOSIS — E782 Mixed hyperlipidemia: Secondary | ICD-10-CM

## 2019-12-23 DIAGNOSIS — I25111 Atherosclerotic heart disease of native coronary artery with angina pectoris with documented spasm: Secondary | ICD-10-CM

## 2019-12-23 DIAGNOSIS — R42 Dizziness and giddiness: Secondary | ICD-10-CM

## 2019-12-23 NOTE — Chronic Care Management (AMB) (Signed)
Chronic Care Management    Clinical Social Work Follow Up Note  12/23/2019 Name: KAYLANEE DIALLO MRN: [id number] DOB: 1941-04-02  NATALLIA KAPNER is a 79 y.o. year old female who is a primary care patient of [name], [name], FNP. The CCM team was consulted for assistance with [employer]  and Level of Care Concerns.   Review of patient status, including review of consultants reports, other relevant assessments, and collaboration with appropriate care team members and the patient's provider was performed as part of comprehensive patient evaluation and provision of chronic care management services.    SDOH (Social Determinants of Health) assessments performed: Yes    Outpatient Encounter Medications as of 12/23/2019  Medication Sig  . acetaminophen (TYLENOL) 325 MG tablet Take 325 mg by mouth every 8 (eight) hours as needed (pain).   . ASPIRIN 81 PO Take 1 tablet by mouth daily.  . betamethasone dipropionate (DIPROLENE) 0.05 % ointment APPLY TO AFFECTED AREAS TWICE DAILY AS NEEDED  . Calcium Carbonate-Vitamin D (CALTRATE 600+D PO) Take 1 tablet by mouth daily.   [name] conjugated estrogens (PREMARIN) vaginal cream Place 1 Applicatorful vaginally daily. Use pea sized amount M-W-Fr before bedtime  . diltiazem (CARDIZEM CD) 240 MG 24 hr capsule Take 1 capsule (240 mg total) by mouth daily.  . memantine (NAMENDA) 10 MG tablet TAKE 1 TABLET TWICE DAILY  . Multiple Vitamin (MULTIVITAMIN) tablet Take 1 tablet by mouth daily.    . nitroGLYCERIN (NITROSTAT) 0.4 MG SL tablet Place 1 tablet (0.4 mg total) under the tongue every 5 (five) minutes as needed for chest pain.  . Omega-3 Fatty Acids (FISH OIL) 1200 MG CAPS Take 1 capsule by mouth daily.   Vladimir Faster Glycol-Propyl Glycol (SYSTANE) 0.4-0.3 % SOLN Apply 1 drop to eye daily as needed (dry eyes).   . traMADol (ULTRAM) 50 MG tablet TAKE ONE TABLET (50 MG) BY MOUTH EVERY 12 HOURS AS NEEDED FOR MODERATE PAIN   No facility-administered encounter  medications on file as of 12/23/2019.     Goals Addressed    . SW- "I need more socialization and support" (pt-stated)       CARE PLAN ENTRY (see longitudinal plan of care for additional care plan information)  Current Barriers:  . Limited social support . Level of care concerns . ADL IADL limitations . Social Isolation . Limited access to caregiver . Memory Deficits . Inability to perform ADL's independently . Inability to perform IADL's independently  Clinical Social Work Clinical Goal(s):  [name] Over the next 120 days, patient will work with SW to address concerns related to gaining additional support/resource connection in order to maintain health . Over the next 120 days, patient will demonstrate improved adherence to self care as evidenced by implementing healthy self-care into her daily routine such as: attending all medical appointments, deep breathing exercsies, taking time for self-reflection, taking medications as prescribed, drinking water and daily exercise to improve mobility.  Interventions: . Inter-disciplinary care team collaboration (see longitudinal plan of care) . Patient interviewed and appropriate assessments performed . Provided patient with information about available socialization opportunities within the area. Patient reports interest in [facility] which will be opening back up in June of 2021.  [name] Discussed plans with patient for ongoing care management follow up and provided patient with direct contact information for care management team . Advised patient to consider private pay personal care service implementation for additional support within the home. . Assisted patient/caregiver with obtaining information about health  plan benefits . Provided education and assistance to client regarding Advanced Directives. . Provided education to patient/caregiver regarding level of care options. . Provided education to patient/caregiver about Hospice and/or  Palliative Care services . Patient reports ongoing dizziness which affects her mobility. Patient reports wanting to improve her mobility and is interested in increasing her daily activity and exercise. LCSW provided education on healthy self-care tools to implement into her daily routine to improve overall health.  . Patient declines any issues with her appetite or fluid intake at this time.   Patient Self Care Activities:  . Attends all scheduled provider appointments . Calls provider office for new concerns or questions . Lacks social connections . Memory loss  Initial goal documentation     Follow Up Plan: SW will follow up with patient by phone over the next quarter  [name], [place], MSW, [email or web address] Phone: [phone]

## 2019-12-29 ENCOUNTER — Encounter: Payer: Self-pay | Admitting: Diagnostic Neuroimaging

## 2019-12-29 ENCOUNTER — Other Ambulatory Visit: Payer: Self-pay

## 2019-12-29 ENCOUNTER — Other Ambulatory Visit: Payer: Self-pay | Admitting: Family Medicine

## 2019-12-29 ENCOUNTER — Ambulatory Visit: Payer: PPO | Admitting: Diagnostic Neuroimaging

## 2019-12-29 ENCOUNTER — Encounter: Payer: Self-pay | Admitting: *Deleted

## 2019-12-29 VITALS — BP 155/74 | HR 69 | Temp 97.6°F | Ht 66.0 in | Wt 126.6 lb

## 2019-12-29 DIAGNOSIS — F0390 Unspecified dementia without behavioral disturbance: Secondary | ICD-10-CM | POA: Diagnosis not present

## 2019-12-29 DIAGNOSIS — R413 Other amnesia: Secondary | ICD-10-CM

## 2019-12-29 DIAGNOSIS — F03B0 Unspecified dementia, moderate, without behavioral disturbance, psychotic disturbance, mood disturbance, and anxiety: Secondary | ICD-10-CM

## 2019-12-29 NOTE — Progress Notes (Signed)
[facility]  PATIENT: Kristin [name] DOB: 06-11-1941  REFERRING CLINICIAN: Elie Goody HISTORY FROM: patient and husband  REASON FOR VISIT: follow up   HISTORICAL  CHIEF COMPLAINT:  Chief Complaint  Patient presents with  . Confusion    rm 7, husband- [name]  MMSE 14    HISTORY OF PRESENT ILLNESS:   UPDATE (12/29/19, VRP): Since last visit, more memory loss. Symptoms are progressive. Severity is moderate. No alleviating or aggravating factors. Tolerating memantine.   PRIOR HPI (07/02/18): 79 year old female here for evaluation of memory loss.  Patient reports 6 to 12 months of mild short-term memory loss and confusion.  She is not sure if this is very significant or not.  She turns to her husband repeatedly for some help to answer my questions.  According to patient's husband, patient has been having at least 2 to 3 years of short-term memory loss and confusion, misplacing objects, forgetting recent conversations, repeating herself, having more trouble with shopping and functioning independently outside of home.  She still is able to drive short distances to familiar places.  She is able to maintain all of her personal activities of daily living.  She is able to do some light housework at home.  Apparently her primary care was concerned about these memory problems as well and recommended to consider Aricept and also attend "memory classes" but this turned off the patient who subsequently "fired her PCP".  Patient is now getting established with a new primary care clinic.  Patient also has obstructive sleep apnea but is not able to tolerate CPAP.  She has hypertension which is suboptimally controlled.  She handles her own medications but her husband helps her with these.   REVIEW OF SYSTEMS: Full 14 system review of systems performed and negative with exception of: Snoring swelling in legs fatigue easy bruising memory loss confusion sleepiness dizziness.  History of  MI.  History of subarachnoid hemorrhage (non-aneurysmal).   ALLERGIES: Allergies  Allergen Reactions  . Angiotensin Receptor Blockers Anaphylaxis    Fatigue & Dizziness  . Other Anaphylaxis    Other reaction(s): Other (See Comments) Dizziness, rash Fatigue & Dizziness  . Hydralazine Other (See Comments)    Unknown  . Latex     REACTION: Rash, blisters, breathing probs.  . Losartan     Dizziness   . Methotrexate Derivatives     Dizziness, rash   . Spironolactone     Dizziness   . Penicillins Hives and Rash    HOME MEDICATIONS: Outpatient Medications Prior to Visit  Medication Sig Dispense Refill  . acetaminophen (TYLENOL) 325 MG tablet Take 325 mg by mouth every 8 (eight) hours as needed (pain).     . ASPIRIN 81 PO Take 1 tablet by mouth daily.    . Calcium Carbonate-Vitamin D (CALTRATE 600+D PO) Take 1 tablet by mouth daily.     [name] diltiazem (CARDIZEM CD) 240 MG 24 hr capsule Take 1 capsule (240 mg total) by mouth daily. 90 capsule 3  . memantine (NAMENDA) 10 MG tablet TAKE 1 TABLET BY MOUTH TWICE DAILY 180 tablet 0  . Multiple Vitamin (MULTIVITAMIN) tablet Take 1 tablet by mouth daily.      . nitroGLYCERIN (NITROSTAT) 0.4 MG SL tablet Place 1 tablet (0.4 mg total) under the tongue every 5 (five) minutes as needed for chest pain. 25 tablet 3  . Omega-3 Fatty Acids (FISH OIL) 1200 MG CAPS Take 1 capsule by mouth daily.     Vladimir Faster  Glycol-Propyl Glycol (SYSTANE) 0.4-0.3 % SOLN Apply 1 drop to eye daily as needed (dry eyes).     . traMADol (ULTRAM) 50 MG tablet TAKE ONE TABLET (50 MG) BY MOUTH EVERY 12 HOURS AS NEEDED FOR MODERATE PAIN 45 tablet 0  . betamethasone dipropionate (DIPROLENE) 0.05 % ointment APPLY TO AFFECTED AREAS TWICE DAILY AS NEEDED    . conjugated estrogens (PREMARIN) vaginal cream Place 1 Applicatorful vaginally daily. Use pea sized amount M-W-Fr before bedtime (Patient not taking: Reported on 12/29/2019) 42.5 g 12   No facility-administered medications  prior to visit.    PAST MEDICAL HISTORY: Past Medical History:  Diagnosis Date  . Allergy   . Arthritis    recent falls -aug 2010  . [name] (coronary artery disease)    mi-1996  . Complication of anesthesia    pt states takes a long time to wake her wake up  . 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 ([place]) 08/27/2015  . Cough    no fever   . Degeneration of lumbar or lumbosacral intervertebral disc   . Dizziness    r/t meds  . DVT (deep venous thrombosis) ([place])   . Fibromyalgia   . History of shingles   . [name] (irritable bowel syndrome)    takes OTC [place] Negus colon health  . Joint pain   . Joint swelling   . Myalgia and myositis, unspecified   . Obstructive sleep apnea (adult) (pediatric)   . Old myocardial infarction 1996  . Osteoarthritis   . Osteoarthrosis, unspecified whether generalized or localized, unspecified site   . Psoriatic arthritis ([place])   . Rheumatoid arthritis ([place])   . Rosacea   . Seasonal allergies   . Stroke [facility])    ischaemic microvascular disease  . 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 Unspecified essential hypertension    takes Diltiazem,Metoprolol,and Losartan daily  . UTI (lower urinary tract infection)    frequent=last one was winter of 2014    PAST SURGICAL HISTORY: Past Surgical History:  Procedure Laterality Date  . ATHERECTOMY  1996  . [place]   vasospams; no blockage  . CARDIAC CATHETERIZATION  2013   No obstructive coronary artery disease. Mild distal LAD and proximal RCA disease.  [name] CATARACT EXTRACTION Bilateral   . COLONOSCOPY    . CT ABD WO/W & PELVIS WO CM  2013   no acute process, + minimal diverticulosis and hepatic/renal cysts, ATH calcifications  . ETHMOIDECTOMY  1998  . 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 NASAL SINUS SURGERY  1998  . OOPHORECTOMY     BSO  . sinus biopsy  1998   inverted papiloma  . VAGINAL HYSTERECTOMY  1992   for mennorhagia-LAVH BSO    FAMILY HISTORY: Family History  Problem Relation Age of Onset  . Colon cancer Father   . Heart attack Mother   . Coronary artery disease Mother   . Osteoporosis Mother   . Heart disease Mother   . Heart failure Mother   . GER disease Sister   . Skin cancer Sister   . Hypertension Sister   . Osteopenia Sister   . GER disease Sister   . Breast cancer Paternal Aunt  age 47  . Heart failure Maternal Grandmother   . Heart failure Maternal Grandfather     SOCIAL HISTORY: Social History   Socioeconomic History  . Marital status: Married    Spouse name: Darnell Level  . Number of children: 2  . Years of education: BSN  . Highest education level: Not on file  Occupational History  . 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 Smoking status: Never Smoker  . Smokeless tobacco: Never Used  Substance and Sexual Activity  . Alcohol use: No    Alcohol/week: 0.0 standard drinks    Comment: Rare  . Drug use: No  . Sexual activity: Not Currently    Birth control/protection: Surgical    Comment: 1st intercourse 63 yo-2 partners  Other Topics Concern  . Not on file  Social History Narrative   12/29/19 lives with husband   No regular exercise-used to do [place] will.. She is DNR, [name] is husband [name] , then son [name]       10/22/12-cardiac work up found the reason for her dizziness,  she needs no neurological follow up for that.  her enthusiasm for CPAP remains limited and is explained by her resiudal high AHI after she finally reached compliance ,[place] 13 points.     95% pressure is 11.7 cm , AHI  20 .7- she does not benefit from CPAP . She  is allowed to d/c the machine and we need to concerntrate on her dizziness and facial right droop. ordered MRI brain repeat.    Eye exam next week. PT at [facility]. MRI brain in April- per patients request order next month.          Social Determinants of Health   Financial Resource Strain: Low Risk   . Difficulty of Paying Living Expenses: Not hard at all  Food Insecurity: No Food Insecurity  . Worried About Charity fundraiser in the Last Year: Never true  . Ran Out of Food in the Last Year: Never true  Transportation Needs: No Transportation Needs  . Lack of Transportation (Medical): No  . Lack of Transportation (Non-Medical): No  Physical Activity: Inactive  . Days of Exercise per Week: 0 days  . Minutes of Exercise per Session: 0 min  Stress: No Stress Concern Present  . Feeling of Stress : Not at all  Social Connections: Somewhat Isolated  . Frequency of Communication with Friends and Family: More than three times a week  . Frequency of Social Gatherings with Friends and Family: More than three times a week  . Attends Religious Services: Never  . Active Member of Clubs or Organizations: No  . Attends Archivist Meetings: Never  . Marital Status: Married  Human resources officer Violence: Not At Risk  . Fear of Current or Ex-Partner: No  . Emotionally Abused: No  . Physically Abused: No  . Sexually Abused: No     PHYSICAL EXAM  GENERAL EXAM/CONSTITUTIONAL: Vitals:  Vitals:   12/29/19 1424  BP: (!) 155/74  Pulse: 69  Temp: 97.6 F (36.4 C)  Weight: 126 lb 9.6 oz (57.4 kg)  Height: 5\' 6"  (1.676 m)   Body mass index is 20.43 kg/m. Wt Readings from Last 3 Encounters:  12/29/19 126 lb 9.6 oz (57.4 kg)  11/04/19 127 lb (57.6 kg)  11/03/19  127 lb (57.6 kg)    Patient is in no distress; well developed, nourished and groomed; neck is supple  CARDIOVASCULAR:  Examination of carotid arteries is normal; no carotid bruits  Regular rate and rhythm, no  murmurs  Examination of peripheral vascular system by observation and palpation is normal  EYES:  Ophthalmoscopic exam of optic discs and posterior segments is normal; no papilledema or hemorrhages No exam data present  MUSCULOSKELETAL:  Gait, strength, tone, movements noted in Neurologic exam below  NEUROLOGIC: MENTAL STATUS:  MMSE - [place] Exam 12/29/2019 07/02/2018 02/21/2018  Orientation to time 0 2 5  Orientation to time comments - - -  Orientation to Place 4 5 5   Registration 3 3 3   Attention/ Calculation 0 0 0  Recall 0 0 0  Recall-comments - - unable to recall 3 of 3 words  Language- name 2 objects 2 2 0  Language- repeat 1 1 1   Language- follow 3 step command 2 3 3   Language- read & follow direction 1 1 0  Write a sentence 1 1 0  Copy design 0 1 0  Total score 14 19 17     awake, alert, oriented to person  [place] attention and concentration  language fluent, comprehension intact, naming intact  fund of knowledge appropriate  CRANIAL NERVE:   2nd - no papilledema on fundoscopic exam  2nd, 3rd, 4th, 6th - pupils equal and reactive to light, visual fields full to confrontation, extraocular muscles intact, no nystagmus  5th - facial sensation symmetric  7th - facial strength symmetric  8th - hearing intact  9th - palate elevates symmetrically, uvula midline  11th - shoulder shrug symmetric  12th - tongue protrusion midline  MOTOR:   normal bulk and tone, full strength in the BUE, BLE; EXCEPT DELTOID 3 (LIMITED BY PAIN) AND HIP FLEXION 3; KNEE EXT/FLEX 4; RIGHT DF 4; LEFT DF 5  SENSORY:   normal and symmetric to light touch, temperature, vibration  COORDINATION:   finger-nose-finger, fine finger movements normal  REFLEXES:   deep tendon reflexes --> BUE 1; TRICEPS 2; KNEES TRACE; ANKLES TRACE  NEG MYERSON; NEG PALMOMENTAL  GAIT/STATION:   narrow based gait; SLOW AND CAUTIOUS     DIAGNOSTIC DATA (LABS, IMAGING,  TESTING) - I reviewed patient records, labs, notes, testing and imaging myself where available.  Lab Results  Component Value Date   WBC 7.4 11/03/2019   HGB 12.1 11/03/2019   HCT 36.8 11/03/2019   MCV 96.1 11/03/2019   PLT 258 11/03/2019      Component Value Date/Time   NA 141 11/03/2019 0941   NA 136 12/29/2016 1036   K 4.1 11/03/2019 0941   CL 104 11/03/2019 0941   CO2 30 11/03/2019 0941   GLUCOSE 85 11/03/2019 0941   BUN 28 (H) 11/03/2019 0941   BUN 58 (H) 12/29/2016 1036   CREATININE 1.21 (H) 11/03/2019 0941   CALCIUM 10.0 11/03/2019 0941   PROT 6.8 11/03/2019 0941   ALBUMIN 3.9 02/21/2018 1035   AST 21 11/03/2019 0941   ALT 14 11/03/2019 0941   ALKPHOS 67 02/21/2018 1035   BILITOT 0.4 11/03/2019 0941   GFRNONAA 43 (L) 11/03/2019 0941   GFRAA 49 (L) 11/03/2019 0941   Lab Results  Component Value Date   CHOL 227 (H) 11/03/2019   HDL 65 11/03/2019   LDLCALC 136 (H) 11/03/2019   LDLDIRECT 158.1 10/02/2013   TRIG 134 11/03/2019   CHOLHDL 3.5 11/03/2019  No results found for: HGBA1C Lab Results  Component Value Date   [id number] 07/27/2017   Lab Results  Component Value Date   TSH 2.44 02/19/2017    06/14/18 CTA neck:[I reviewed images myself and agree with interpretation. -VRP]  1. No evidence of dissection, hemodynamically significant stenosis, or occlusion.  2. Non stenotic atherosclerosis of aorta and carotid bifurcations.  06/14/18 CTA head:[I reviewed images myself and agree with interpretation. -VRP]  1. No large vessel occlusion, high-grade stenosis, or vascular malformation. 2. Right P2 mild-to-moderate stenosis. 3. Non stenotic calcific atherosclerosis of carotid siphons.   ASSESSMENT AND PLAN  79 y.o. year old female here with gradual onset progressive short-term memory loss, confusion, repeating herself, misplacing objects, slight change in activities of daily living.  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  Dx:  1. 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    Penni Bombard, MD [id number], [id number] PM Certified in Neurology, Neurophysiology and Neuroimaging  [facility] [address], [place], [place] [address] [phone]

## 2019-12-29 NOTE — Patient Instructions (Signed)
  MEMORY LOSS (moderate dementia)  - continue memantine 10mg  twice a day   - safety / supervision issues reviewed  - caregiver resources provided  - no driving; caution with finances and medications and staying alone

## 2019-12-30 ENCOUNTER — Ambulatory Visit: Payer: PPO | Admitting: Licensed Clinical Social Worker

## 2019-12-30 DIAGNOSIS — R413 Other amnesia: Secondary | ICD-10-CM

## 2019-12-30 DIAGNOSIS — I10 Essential (primary) hypertension: Secondary | ICD-10-CM

## 2019-12-30 DIAGNOSIS — E782 Mixed hyperlipidemia: Secondary | ICD-10-CM

## 2019-12-30 DIAGNOSIS — I25111 Atherosclerotic heart disease of native coronary artery with angina pectoris with documented spasm: Secondary | ICD-10-CM

## 2019-12-30 NOTE — Chronic Care Management (AMB) (Signed)
Chronic Care Management    Clinical Social Work Follow Up Note  12/30/2019 Name: Kristin [name] MRN: [id number] DOB: 08-05-1941  Kristin [name] is a 79 y.o. year old female who is a primary care patient of [name], [name], FNP. The CCM team was consulted for assistance with [employer] .   Review of patient status, including review of consultants reports, other relevant assessments, and collaboration with appropriate care team members and the patient's provider was performed as part of comprehensive patient evaluation and provision of chronic care management services.    SDOH (Social Determinants of Health) assessments performed: Yes    Outpatient Encounter Medications as of 12/30/2019  Medication Sig  . acetaminophen (TYLENOL) 325 MG tablet Take 325 mg by mouth every 8 (eight) hours as needed (pain).   . ASPIRIN 81 PO Take 1 tablet by mouth daily.  . betamethasone dipropionate (DIPROLENE) 0.05 % ointment APPLY TO AFFECTED AREAS TWICE DAILY AS NEEDED  . Calcium Carbonate-Vitamin D (CALTRATE 600+D PO) Take 1 tablet by mouth daily.   [name] conjugated estrogens (PREMARIN) vaginal cream Place 1 Applicatorful vaginally daily. Use pea sized amount M-W-Fr before bedtime (Patient not taking: Reported on 12/29/2019)  . diltiazem (CARDIZEM CD) 240 MG 24 hr capsule Take 1 capsule (240 mg total) by mouth daily.  . memantine (NAMENDA) 10 MG tablet TAKE 1 TABLET BY MOUTH TWICE DAILY  . Multiple Vitamin (MULTIVITAMIN) tablet Take 1 tablet by mouth daily.    . nitroGLYCERIN (NITROSTAT) 0.4 MG SL tablet Place 1 tablet (0.4 mg total) under the tongue every 5 (five) minutes as needed for chest pain.  . Omega-3 Fatty Acids (FISH OIL) 1200 MG CAPS Take 1 capsule by mouth daily.   Vladimir Faster Glycol-Propyl Glycol (SYSTANE) 0.4-0.3 % SOLN Apply 1 drop to eye daily as needed (dry eyes).   . traMADol (ULTRAM) 50 MG tablet TAKE ONE TABLET (50 MG) BY MOUTH EVERY 12 HOURS AS NEEDED FOR MODERATE PAIN   No  facility-administered encounter medications on file as of 12/30/2019.     Goals Addressed    . SW- "I need more socialization and support" (pt-stated)       CARE PLAN ENTRY (see longitudinal plan of care for additional care plan information)  Current Barriers:  . Limited social support . Level of care concerns . ADL IADL limitations . Social Isolation . Limited access to caregiver . Memory Deficits . Inability to perform ADL's independently . Inability to perform IADL's independently  Clinical Social Work Clinical Goal(s):  [name] Over the next 120 days, patient will work with SW to address concerns related to gaining additional support/resource connection in order to maintain health . Over the next 120 days, patient will demonstrate improved adherence to self care as evidenced by implementing healthy self-care into her daily routine such as: attending all medical appointments, deep breathing exercsies, taking time for self-reflection, taking medications as prescribed, drinking water and daily exercise to improve mobility.  Interventions: . Inter-disciplinary care team collaboration (see longitudinal plan of care) . Patient interviewed and appropriate assessments performed . Provided patient and caregiver with information about available socialization opportunities within the area. Spouse reports that patient use to go to the [facility] prior to [place] but it has been closed since April of last year. Patient reports interest in [facility] which will be opening back up in June of 2021. LCSW provided family with information about [facility] and sent secure email to spouse on 12/30/19 with  this resource education as well.  . Discussed plans with patient for ongoing care management follow up and provided patient with direct contact information for care management team . Advised patient to consider private pay personal care service implementation for additional support  within the home. [name] Healthy self-cafe education provided to both patient and caregiver.  . Assisted patient/caregiver with obtaining information about health plan benefits . Provided education and assistance to client regarding Advanced Directives. . Provided education to patient/caregiver regarding level of care options. . Provided education to patient/caregiver about Hospice and/or Palliative Care services . Patient reports ongoing dizziness which affects her mobility. Patient reports wanting to improve her mobility and is interested in increasing her daily activity and exercise. LCSW provided education on healthy self-care tools to implement into her daily routine to improve overall health.  . Patient declines any issues with her appetite or fluid intake at this time.  [name] Spouse reports that he took patient to see her neurologist yesterday and the visit went well. He reports that the neurologist approved patient going on a short trip with spouse next month which they are both very excited about. Patient continues to take memantine 10mg  twice a day for dementia related symptoms.   Patient Self Care Activities:  . Attends all scheduled provider appointments . Calls provider office for new concerns or questions . Lacks social connections . Memory loss  Please see past updates related to this goal by clicking on the "Past Updates" button in the selected goal      Follow Up Plan: SW will follow up with patient by phone over the next quarter  [name], [place], [email or web address], [name] Phone: [phone]

## 2019-12-31 ENCOUNTER — Telehealth: Payer: Self-pay

## 2019-12-31 NOTE — Telephone Encounter (Signed)
Copied from [place] [id number]. Topic: Referral - Status >> Dec 31, 2019 0000000 PM Simone Curia D wrote: [id number] left message for patient to return my call regarding caregiver support and respite care. Ambrose Mantle [phone]

## 2020-01-02 ENCOUNTER — Telehealth: Payer: Self-pay

## 2020-01-02 ENCOUNTER — Other Ambulatory Visit: Payer: Self-pay

## 2020-01-02 ENCOUNTER — Ambulatory Visit (INDEPENDENT_AMBULATORY_CARE_PROVIDER_SITE_OTHER): Payer: PPO | Admitting: Physician Assistant

## 2020-01-02 VITALS — BP 148/73 | HR 80 | Temp 98.3°F | Ht 67.0 in | Wt 126.0 lb

## 2020-01-02 DIAGNOSIS — Z87440 Personal history of urinary (tract) infections: Secondary | ICD-10-CM | POA: Diagnosis not present

## 2020-01-02 DIAGNOSIS — N390 Urinary tract infection, site not specified: Secondary | ICD-10-CM

## 2020-01-02 LAB — URINALYSIS, COMPLETE: Specific Gravity, UA: 1.015 (ref 1.005–1.030)

## 2020-01-02 LAB — MICROSCOPIC EXAMINATION: WBC, UA: >30 /hpf — AB (ref 0–5)

## 2020-01-02 MED ORDER — NITROFURANTOIN MONOHYD MACRO 100 MG PO CAPS: 100.0000 mg | ORAL_CAPSULE | Freq: Two times a day (BID) | ORAL | 0 refills | Status: DC

## 2020-01-02 NOTE — Telephone Encounter (Signed)
Incoming call from pt stating she is having dysuria and increasing urinary frequency x 2 days. Pt w/ hx of uti. Pt added to schedule.

## 2020-01-02 NOTE — Progress Notes (Signed)
01/02/2020 3:39 PM   Kristin [name] [birth year] [id number]  CC: Chief Complaint  Patient presents with  . Urinary Tract Infection    HPI: Kristin [name] is a 79 y.o. female with PMH hematuria, vaginal atrophy, chronic cystitis, recurrent UTI who presents today for evaluation of possible UTI. She is an established [facility] patient who last saw Zara Council on 05/16/2019 for the same.  Urine culture that day grew Bactrim and tetracycline resistant E. Coli; she was treated with Macrobid 100 mg twice daily x7 days.  She is accompanied today by her husband, who contributes to HPI.  Today, patient reports a 2-day history of dysuria and frequency.  She has taken Azo for symptom relief at home with adequate symptom palliation.  She reports no other urinary tract infections since her most recent treatment from our clinic in October 2020.  She has been prescribed topical vaginal estrogen cream for recurrent UTI prevention, however she reports she is no longer taking this.  In-office UA today positive for 3+ blood, 3+ protein, urobilinogen, nitrites, and 3+ leukocyte esterase with significant pigment interference and orange color; urine microscopy with >30 WBCs/HPF, 3-10 RBCs/HPF, and many bacteria.  PMH: Past Medical History:  Diagnosis Date  . Allergy   . Arthritis    recent falls -aug 2010  . [name] (coronary artery disease)    mi-1996  . Complication of anesthesia    pt states takes a long time to wake her wake up  . 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 Cough    no fever   . Degeneration of lumbar or lumbosacral intervertebral disc   . Dizziness    r/t meds  . DVT (deep venous thrombosis) ([facility])   . Fibromyalgia   . History of shingles   . [name] (irritable bowel syndrome)    takes OTC Hardin Negus colon health  . Joint pain   . Joint swelling   . Myalgia and myositis, unspecified   . Obstructive sleep  apnea (adult) (pediatric)   . Old myocardial infarction 1996  . Osteoarthritis   . Osteoarthrosis, unspecified whether generalized or localized, unspecified site   . Psoriatic arthritis ([facility])   . Rheumatoid arthritis ([facility])   . Rosacea   . Seasonal allergies   . Stroke [facility])    ischaemic microvascular disease  . 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 Unspecified essential hypertension    takes Diltiazem,Metoprolol,and Losartan daily  . UTI (lower urinary tract infection)    frequent=last one was winter of 2014    Surgical History: Past Surgical History:  Procedure Laterality Date  . ATHERECTOMY  1996  . [place]   vasospams; no blockage  . CARDIAC CATHETERIZATION  2013   No obstructive coronary artery disease. Mild distal LAD and proximal RCA disease.  [name] CATARACT EXTRACTION Bilateral   . COLONOSCOPY    . CT ABD WO/W & PELVIS WO CM  2013   no acute process, + minimal diverticulosis and hepatic/renal cysts, ATH calcifications  . ETHMOIDECTOMY  1998  . 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 NASAL SINUS SURGERY  1998  . OOPHORECTOMY     BSO  . sinus biopsy  1998  inverted papiloma  . VAGINAL HYSTERECTOMY  1992   for mennorhagia-LAVH BSO    Home Medications:  Allergies as of 01/02/2020      Reactions   Angiotensin Receptor Blockers Anaphylaxis   Fatigue & Dizziness   Other Anaphylaxis   Other reaction(s): Other (See Comments) Dizziness, rash Fatigue & Dizziness   Hydralazine Other (See Comments)   Unknown   Latex    REACTION: Rash, blisters, breathing probs.   Losartan    Dizziness   Methotrexate Derivatives    Dizziness, rash   Spironolactone    Dizziness   Penicillins Hives, Rash      Medication List       Accurate as of Jan 02, 2020  3:39 PM. If you have any questions, ask your nurse or doctor.        acetaminophen 325 MG tablet Commonly known as: TYLENOL Take 325 mg by mouth every 8 (eight) hours as needed (pain).   ASPIRIN 81 PO Take 1 tablet by mouth daily.   betamethasone dipropionate 0.05 % ointment Commonly known as: DIPROLENE APPLY TO AFFECTED AREAS TWICE DAILY AS NEEDED   CALTRATE 600+D PO Take 1 tablet by mouth daily.   diltiazem 240 MG 24 hr capsule Commonly known as: CARDIZEM CD Take 1 capsule (240 mg total) by mouth daily.   Fish Oil 1200 MG Caps Take 1 capsule by mouth daily.   memantine 10 MG tablet Commonly known as: NAMENDA TAKE 1 TABLET BY MOUTH TWICE DAILY   multivitamin tablet Take 1 tablet by mouth daily.   nitroGLYCERIN 0.4 MG SL tablet Commonly known as: Nitrostat Place 1 tablet (0.4 mg total) under the tongue every 5 (five) minutes as needed for chest pain.   Premarin vaginal cream Generic drug: conjugated estrogens Place 1 Applicatorful vaginally daily. Use pea sized amount M-W-Fr before bedtime   Systane 0.4-0.3 % Soln Generic drug: Polyethyl Glycol-Propyl Glycol Apply 1 drop to eye daily as needed (dry eyes).   traMADol 50 MG tablet Commonly known as: ULTRAM TAKE ONE TABLET (50 MG) BY MOUTH EVERY 12 HOURS AS NEEDED FOR MODERATE PAIN       Allergies:  Allergies  Allergen Reactions  . Angiotensin Receptor Blockers Anaphylaxis    Fatigue & Dizziness  . Other Anaphylaxis    Other reaction(s): Other (See Comments) Dizziness, rash Fatigue & Dizziness  . Hydralazine Other (See Comments)    Unknown  . Latex     REACTION: Rash, blisters, breathing probs.  . Losartan     Dizziness   . Methotrexate Derivatives     Dizziness, rash   . Spironolactone     Dizziness   . Penicillins Hives and Rash    Family History: Family History  Problem Relation Age of Onset  . Colon cancer Father   . Heart attack Mother   . Coronary artery disease Mother   .  Osteoporosis Mother   . Heart disease Mother   . Heart failure Mother   . GER disease Sister   . Skin cancer Sister   . Hypertension Sister   . Osteopenia Sister   . GER disease Sister   . Breast cancer Paternal Aunt        age 59  . Heart failure Maternal Grandmother   . Heart failure Maternal Grandfather     Social History:   reports that she has never smoked. She has never used smokeless tobacco. She reports that she does not drink alcohol or use drugs.  Physical  Exam: BP (!) 148/73   Pulse 80   Temp 98.3 F (36.8 C)   Ht 5\' 7"  (1.702 m)   Wt 126 lb (57.2 kg)   LMP 08/14/1989   BMI 19.73 kg/m   Constitutional:  Alert and oriented, no acute distress, nontoxic appearing HEENT: [place], AT Cardiovascular: No clubbing, cyanosis, or edema Respiratory: Normal respiratory effort, no increased work of breathing Skin: No rashes, bruises or suspicious lesions Neurologic: Grossly intact, no focal deficits, moving all 4 extremities Psychiatric: Normal mood and affect  Laboratory Data: Results for orders placed or performed in visit on 01/02/20  CULTURE, URINE COMPREHENSIVE   Specimen: Urine   UR  Result Value Ref Range   Urine Culture, Comprehensive Preliminary report (A)    Organism ID, Bacteria Escherichia coli (A)   Microscopic Examination   URINE  Result Value Ref Range   WBC, UA >30 (A) 0 - 5 /hpf   RBC 3-10 (A) 0 - 2 /hpf   Epithelial Cells (non renal) 0-10 0 - 10 /hpf   Bacteria, UA Many (A) None seen/Few  Urinalysis, Complete  Result Value Ref Range   Specific Gravity, UA 1.015 1.005 - 1.030   pH, UA CANCELED    Color, UA Orange Yellow   Appearance Ur Cloudy (A) Clear   Protein,UA CANCELED    Glucose, UA CANCELED    Ketones, UA CANCELED    Microscopic Examination See below:    Assessment & Plan:   1. Recurrent UTI 79 year old female with a history of recurrent UTI and chronic cystitis presents with a 2-day history of dysuria, urgency, and frequency.  UA  contaminated due to recent Azo use, however notable for pyuria and bacteriuria.  We will start her on empiric Macrobid and send urine for culture for further evaluation.  Counseled patient that I will contact her if her urine culture results indicated a necessary change in therapy.  She expressed understanding.  Of note, she was noted to have microscopic hematuria today, however she recently underwent work-up for hematuria.  Will defer repeat UA to prove resolution, as I do not suspect we will achieve this.  Counseled patient to start daily probiotics and once to twice daily cranberry supplements for UTI prevention. - Urinalysis, Complete - CULTURE, URINE COMPREHENSIVE - nitrofurantoin, macrocrystal-monohydrate, (MACROBID) 100 MG capsule; Take 1 capsule (100 mg total) by mouth every 12 (twelve) hours for 5 days.  Dispense: 10 capsule; Refill: 0   Return if symptoms worsen or fail to improve.  Debroah Loop, PA-C  [facility] [address] [place], [place] [address] [phone]

## 2020-01-02 NOTE — Patient Instructions (Signed)
1. I have sent a prescription for antibiotics to [facility]. Take this twice a day for 5 days. I am sending your urine for culture today; I will call you early next week if I need to change your antibiotics based on these results. 2. Start taking an over-the-counter cranberry supplement for urinary tract health. Take this once or twice daily on an empty stomach, e.g. right before bed. 3. Start taking an over-the-counter probiotic, preferably containing lactobacillus. Take this daily.

## 2020-01-05 ENCOUNTER — Ambulatory Visit: Payer: PPO | Admitting: Physician Assistant

## 2020-01-05 LAB — CULTURE, URINE COMPREHENSIVE

## 2020-01-06 ENCOUNTER — Telehealth: Payer: Self-pay

## 2020-01-06 NOTE — Telephone Encounter (Signed)
Copied from [place] [id number]. Topic: Referral - Status >> Jan 06, 2020  [id number] PM Simone Curia D wrote: [id number] Spoke with patient's Rosalin Hawking about respite care resource [facility].  Mr. Mckeegan mentioned he had received a list of adult day care resources from [facility] and will contact them the first week of June when they re-open. He stated the patient did not need anything else at this time.  Closing referral.  Ambrose Mantle [phone]

## 2020-01-07 ENCOUNTER — Ambulatory Visit: Payer: PPO | Admitting: Pharmacist

## 2020-01-07 DIAGNOSIS — E782 Mixed hyperlipidemia: Secondary | ICD-10-CM | POA: Diagnosis not present

## 2020-01-07 DIAGNOSIS — I25111 Atherosclerotic heart disease of native coronary artery with angina pectoris with documented spasm: Secondary | ICD-10-CM | POA: Diagnosis not present

## 2020-01-07 DIAGNOSIS — I10 Essential (primary) hypertension: Secondary | ICD-10-CM

## 2020-01-07 NOTE — Patient Instructions (Signed)
Thank you allowing the Chronic Care Management Team to be a part of your care! It was a pleasure speaking with you today!     CCM (Chronic Care Management) Team    Noreene Larsson RN, MSN, CCM Nurse Care Coordinator  [phone]   Harlow Asa PharmD  Clinical Pharmacist  [phone]   [name] LCSW Clinical Social Worker [phone]  Visit Information  Goals Addressed            This Visit's Progress   . PharmD - Medication Review       CARE PLAN ENTRY (see longitudinal plan of care for additional care plan information)   Current Barriers:  . 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 Inter-disciplinary care team collaboration (see longitudinal plan of care) . Collaborated with PCP: Patient with history of MI not currently on statin.  [name] Perform chart review o Patient seen by Neurologist on 5/17 o Patient seen by Urology on 5/21 for UTI and advised to: - Start Macrobid 100 mg every 12 (twelve) hours for 5 days - Over-the-counter cranberry supplement for urinary tract health - Over-the-counter probiotic, preferably containing lactobacillus . Follow up with caregiver regarding medication management o Note spouse manages patient's medications and fills weekly pillbox - Reports that he has also started to manage patient's tramadol prescription, administering to patient as needed as directed o Reports patient completed course of Macrobid this morning and started on both cranberry and probiotic supplements as directed by Urologist o Confirms patient restarted and currently taking memantine 10 mg twice daily as directed . Discuss benefits of statin  use for ASCVD risk reduction o Spouse and patient state that they will follow up with Cardiologist to further discuss statin therapy . Discuss importance of BP control and monitoring o Reports patient taking: diltiazem CD 240 mg once daily o Confirm received BP monitoring log, but not yet checking regularly o Encourage patient and caregiver to check BP with patient 1-2 x per week at home, keep log, and bring to medical appointments o Encourage to call office for readings outside of established parameters  Patient Self Care Activities:  . UNABLE to self-administer medications as prescribed. Spouse fills weekly pillbox for patient . Attends all scheduled provider appointments with help of spouse  Please see past updates related to this goal by clicking on the "Past Updates" button in the selected goal         Patient verbalizes understanding of instructions provided today.   The care management team will reach out to the patient again over the next 30 days.   Harlow Asa, PharmD, [place] [employer] [phone]

## 2020-01-07 NOTE — Chronic Care Management (AMB) (Signed)
Chronic Care Management   Follow Up Note   01/07/2020 Name: Kristin [name] MRN: [id number] DOB: October 12, 1940  Referred by: Verl Bangs, FNP Reason for referral : Chronic Care Management (Patient & Caregiver Phone Call)   Kristin [name] is a 79 y.o. year old female who is a primary care patient of Verl Bangs, FNP. The CCM team was consulted for assistance with chronic disease management and care coordination needs.    I reached out to Kristin [name] and her husband by phone today.   Review of patient status, including review of consultants reports, relevant laboratory and other test results, and collaboration with appropriate care team members and the patient's provider was performed as part of comprehensive patient evaluation and provision of chronic care management services.     Outpatient Encounter Medications as of 01/07/2020  Medication Sig  . Cranberry 400 MG TABS Take 1 tablet by mouth daily.  [name] diltiazem (CARDIZEM CD) 240 MG 24 hr capsule Take 1 capsule (240 mg total) by mouth daily.  . Lactobacillus Rhamnosus, GG, (CULTURELLE PO) Take 1 capsule by mouth daily.  . memantine (NAMENDA) 10 MG tablet TAKE 1 TABLET BY MOUTH TWICE DAILY  . traMADol (ULTRAM) 50 MG tablet TAKE ONE TABLET (50 MG) BY MOUTH EVERY 12 HOURS AS NEEDED FOR MODERATE PAIN  . acetaminophen (TYLENOL) 325 MG tablet Take 325 mg by mouth every 8 (eight) hours as needed (pain).   . ASPIRIN 81 PO Take 1 tablet by mouth daily.  . betamethasone dipropionate (DIPROLENE) 0.05 % ointment APPLY TO AFFECTED AREAS TWICE DAILY AS NEEDED  . Calcium Carbonate-Vitamin D (CALTRATE 600+D PO) Take 1 tablet by mouth daily.   . Multiple Vitamin (MULTIVITAMIN) tablet Take 1 tablet by mouth daily.    . nitroGLYCERIN (NITROSTAT) 0.4 MG SL tablet Place 1 tablet (0.4 mg total) under the tongue every 5 (five) minutes as needed for chest pain.  . Omega-3 Fatty Acids (FISH OIL) 1200 MG CAPS Take 1 capsule by mouth daily.   Vladimir Faster Glycol-Propyl Glycol (SYSTANE) 0.4-0.3 % SOLN Apply 1 drop to eye daily as needed (dry eyes).   . [DISCONTINUED] nitrofurantoin, macrocrystal-monohydrate, (MACROBID) 100 MG capsule Take 1 capsule (100 mg total) by mouth every 12 (twelve) hours for 5 days.   No facility-administered encounter medications on file as of 01/07/2020.    Goals Addressed            This Visit's Progress   . PharmD - Medication Review       CARE PLAN ENTRY (see longitudinal plan of care for additional care plan information)   Current Barriers:  . 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 Inter-disciplinary care team collaboration (see longitudinal plan of care) . Collaborated with PCP: Patient with history of MI not currently on statin.  [name] Perform chart review o Patient seen by Neurologist on 5/17 o Patient seen by Urology on 5/21 for UTI and advised to: - Start Macrobid 100 mg every 12 (twelve) hours for 5 days - Over-the-counter cranberry supplement for urinary tract health - Over-the-counter probiotic, preferably containing lactobacillus . Follow up with caregiver regarding medication management o Note spouse manages patient's medications and fills weekly pillbox -  Reports that he has also started to manage patient's tramadol prescription, administering to patient as needed as directed o Reports patient completed course of Macrobid this morning and started on both cranberry and probiotic supplements as directed by Urologist o Confirms patient restarted and currently taking memantine 10 mg twice daily as directed . Discuss benefits of statin use for ASCVD risk  reduction o Spouse and patient state that they will follow up with Cardiologist to further discuss statin therapy . Discuss importance of BP control and monitoring o Reports patient taking: diltiazem CD 240 mg once daily o Confirm received BP monitoring log, but not yet checking regularly o Encourage patient and caregiver to check BP with patient 1-2 x per week at home, keep log, and bring to medical appointments o Encourage to call office for readings outside of established parameters  Patient Self Care Activities:  . UNABLE to self-administer medications as prescribed. Spouse fills weekly pillbox for patient . Attends all scheduled provider appointments with help of spouse  Please see past updates related to this goal by clicking on the "Past Updates" button in the selected goal         Plan  The care management team will reach out to the patient again over the next 30 days.   Harlow Asa, PharmD, [place] [employer] [phone]

## 2020-02-05 ENCOUNTER — Telehealth: Payer: PPO | Admitting: General Practice

## 2020-02-05 ENCOUNTER — Ambulatory Visit (INDEPENDENT_AMBULATORY_CARE_PROVIDER_SITE_OTHER): Payer: PPO | Admitting: General Practice

## 2020-02-05 DIAGNOSIS — E782 Mixed hyperlipidemia: Secondary | ICD-10-CM | POA: Diagnosis not present

## 2020-02-05 DIAGNOSIS — I25111 Atherosclerotic heart disease of native coronary artery with angina pectoris with documented spasm: Secondary | ICD-10-CM

## 2020-02-05 DIAGNOSIS — I10 Essential (primary) hypertension: Secondary | ICD-10-CM

## 2020-02-05 DIAGNOSIS — R413 Other amnesia: Secondary | ICD-10-CM

## 2020-02-05 NOTE — Patient Instructions (Signed)
Visit Information  Goals Addressed              This Visit's Progress     RNCM: Kristin [name]:"I get dizzy and loopy a lot" (Kristin [name]-stated)        CARE PLAN ENTRY (see longitudinal plan of care for additional care plan information)  Current Barriers:   Knowledge Deficits related to resources in the community to help in the home during the day while the patient's husband runs errands and does volunteer work  Care Coordination needs related to resources for services to help  in a patient with memory loss and dizziness  (disease states)  Chronic Disease Management support and education needs related to memory loss and mental capacity functions  Lacks caregiver support.   Nurse Case Manager Clinical Goal(s):   Over the next 120 days, patient will verbalize understanding of plan for finding resources in the community to help with patient needs when the patient's husband is running errands or doing volunteer work  Over the next 120 days, patient will work with [employer], CCM team and pcp to address needs related to patients needs related to decline in mental capacity and memory loss  Over the next 120 days, patient will work with care guides (community agency) to have available resources to use in the community to assist with the needs of the patient with memory loss  Interventions:   Inter-disciplinary care team collaboration (see longitudinal plan of care)  Advised patient to call the pcp for changes in memory or worsening s/s of dizziness   Provided education to patient re: working with the patient and her husband to help with resources for assistance in the home when her husband is running errands or doing volunteer work  Evaluation of current routine. The patient states that she does well right now when her husband is out running errands. The patient likes to knit and do crafts. The patient does have issues with memory loss and dizziness but endorses being safe at home and no falls. The patient  states she can get "loopy" sometimes. The patient says her husband is concerned about her when he is away. Is open to resources in the community. Before COVID19 she was going to the [facility] and a senior center enjoying knitting.  She is hopeful to get back to something like this when "things open up".  She says they are planning a trip to [place] soon to visit with family and then there is a trip in June but she told her husband she did not think she needed to go. She says he wants her to go. The patient wants to be independent and continue to enjoy the things that she does.02-05-2020: Per the patients husband they did not go on the trip to [place], it did not work out. The patients memory is about the same as the last call. The dizziness she is having is still there but some days are worse than others. The patient has had no falls per the husband. The patient is still working with her crafts and this she enjoys. The husband still is able to do his volunteer work at this time. The patients husband denies any needs at this time.    Collaborated with CCM team, pcp and care guides regarding available resources to help in the home or day cares in the area to help with patient with decline in memory  Discussed plans with patient for ongoing care management follow up and provided patient with direct contact information for  care management team  Care Guide referral for resources in the community to help with adult day care resources or in home services to help when the patients husband is out doing errands or volunteer work.   Patient Self Care Activities:   Patient verbalizes understanding of plan to have CCM team work with her and her husband to help find resources in the community to provide adult day care options or in home day care help  Attends all scheduled provider appointments  Performs ADL's independently  Calls provider office for new concerns or questions  Unable to independently manage care during the  day when the patients husband is out running errands or doing volunteer work as evidence of patient having dizziness and memory loss.   Please see past updates related to this goal by clicking on the "Past Updates" button in the selected goal        RNCM:"I don't check my blood pressure a lot" (Kristin [name]-stated)        CARE PLAN ENTRY (see longtitudinal plan of care for additional care plan information)  Current Barriers:   Chronic Disease Management support, education, and care coordination needs related to HTN and HLD  Clinical Goal(s) related to HTN and HLD:  Over the next 120 days, patient will:   Work with the care management team to address educational, disease management, and care coordination needs   Begin or continue self health monitoring activities as directed today Measure and record blood pressure 2 times per week and adhere to a heart healthy diet  Call provider office for new or worsened signs and symptoms Blood pressure findings outside established parameters and New or worsened symptom related to HTN/HLD and other chronic conditions  Call care management team with questions or concerns  Verbalize basic understanding of patient centered plan of care established today  Interventions related to HTN and HLD:   Evaluation of current treatment plans and patient's adherence to plan as established by provider  Assessed patient understanding of disease states.  Per the husband he and the patient understand her conditions well. They check her blood pressure at home and it is good. Did not provide readings to the [facility].  Assessed patient's education and care coordination needs.  Per the husband there have been no new changes in the patients condition. Ask if they had talked with the cardiologist about adding a statin to her medication therapy and the husband states they have not. Will continue to follow up in subsequent calls.   Provided disease specific education to patient.  The  patient states she will check her blood pressure when she goes to the pharmacist and it is usually good. Ask the patient about checking the blood pressure at home and writing down values to have for the next call. Will discuss further with the patients husband at next outreach.   Collaborated with appropriate clinical care team members regarding patient needs  Patient Self Care Activities related to HTN and HLD:   Patient is unable to independently self-manage chronic health conditions  Please see past updates related to this goal by clicking on the "Past Updates" button in the selected goal         Patient verbalizes understanding of instructions provided today.   The care management team will reach out to the patient again over the next 60 to 90 days.   Noreene Larsson RN, MSN, [place] Mobile: [phone]

## 2020-02-05 NOTE — Chronic Care Management (AMB) (Signed)
Chronic Care Management   Follow Up Note   02/05/2020 Name: Kristin [name] MRN: [id number] DOB: [birth year]  Referred by: Verl Bangs, FNP Reason for referral : Chronic Care Management (Follow up: RNCM: Chronic Disease Management and Care coordination needs. )   Kristin [name] is a 79 y.o. year old female who is a primary care patient of Verl Bangs, FNP. The CCM team was consulted for assistance with chronic disease management and care coordination needs.    Review of patient status, including review of consultants reports, relevant laboratory and other test results, and collaboration with appropriate care team members and the patient's provider was performed as part of comprehensive patient evaluation and provision of chronic care management services.    SDOH (Social Determinants of Health) assessments performed: Yes See Care Plan activities for detailed interventions related to [facility])     Outpatient Encounter Medications as of 02/05/2020  Medication Sig  . acetaminophen (TYLENOL) 325 MG tablet Take 325 mg by mouth every 8 (eight) hours as needed (pain).   . ASPIRIN 81 PO Take 1 tablet by mouth daily.  . betamethasone dipropionate (DIPROLENE) 0.05 % ointment APPLY TO AFFECTED AREAS TWICE DAILY AS NEEDED  . Calcium Carbonate-Vitamin D (CALTRATE 600+D PO) Take 1 tablet by mouth daily.   . Cranberry 400 MG TABS Take 1 tablet by mouth daily.  [name] diltiazem (CARDIZEM CD) 240 MG 24 hr capsule Take 1 capsule (240 mg total) by mouth daily.  . Lactobacillus Rhamnosus, GG, (CULTURELLE PO) Take 1 capsule by mouth daily.  . memantine (NAMENDA) 10 MG tablet TAKE 1 TABLET BY MOUTH TWICE DAILY  . Multiple Vitamin (MULTIVITAMIN) tablet Take 1 tablet by mouth daily.    . nitroGLYCERIN (NITROSTAT) 0.4 MG SL tablet Place 1 tablet (0.4 mg total) under the tongue every 5 (five) minutes as needed for chest pain.  . Omega-3 Fatty Acids (FISH OIL) 1200 MG CAPS Take 1 capsule by mouth daily.   Vladimir Faster Glycol-Propyl Glycol (SYSTANE) 0.4-0.3 % SOLN Apply 1 drop to eye daily as needed (dry eyes).   . traMADol (ULTRAM) 50 MG tablet TAKE ONE TABLET (50 MG) BY MOUTH EVERY 12 HOURS AS NEEDED FOR MODERATE PAIN   No facility-administered encounter medications on file as of 02/05/2020.     Objective:   Goals Addressed              This Visit's Progress   .  RNCM: pt:"I get dizzy and loopy a lot" (pt-stated)        CARE PLAN ENTRY (see longitudinal plan of care for additional care plan information)  Current Barriers:  [name] Knowledge Deficits related to resources in the community to help in the home during the day while the patient's husband runs errands and does volunteer work . Care Coordination needs related to resources for services to help  in a patient with memory loss and dizziness  (disease states) . Chronic Disease Management support and education needs related to memory loss and mental capacity functions . Lacks caregiver support.   Nurse Case Manager Clinical Goal(s):  [name] Over the next 120 days, patient will verbalize understanding of plan for finding resources in the community to help with patient needs when the patient's husband is running errands or doing volunteer work . Over the next 120 days, patient will work with [facility], [place] team and pcp to address needs related to patients needs related to decline in mental capacity and memory loss . Over the next  120 days, patient will work with care guides (community agency) to have available resources to use in the community to assist with the needs of the patient with memory loss  Interventions:  . Inter-disciplinary care team collaboration (see longitudinal plan of care) . Advised patient to call the pcp for changes in memory or worsening s/s of dizziness  . Provided education to patient re: working with the patient and her husband to help with resources for assistance in the home when her husband is running errands or doing  volunteer work . Evaluation of current routine. The patient states that she does well right now when her husband is out running errands. The patient likes to knit and do crafts. The patient does have issues with memory loss and dizziness but endorses being safe at home and no falls. The patient states she can get "loopy" sometimes. The patient says her husband is concerned about her when he is away. Is open to resources in the community. Before COVID19 she was going to the [facility] and a senior center enjoying knitting.  She is hopeful to get back to something like this when "things open up".  She says they are planning a trip to [place] soon to visit with family and then there is a trip in June but she told her husband she did not think she needed to go. She says he wants her to go. The patient wants to be independent and continue to enjoy the things that she does.02-05-2020: Per the patients husband they did not go on the trip to [place], it did not work out. The patients memory is about the same as the last call. The dizziness she is having is still there but some days are worse than others. The patient has had no falls per the husband. The patient is still working with her crafts and this she enjoys. The husband still is able to do his volunteer work at this time. The patients husband denies any needs at this time.   Nash Dimmer with CCM team, pcp and care guides regarding available resources to help in the home or day cares in the area to help with patient with decline in memory . Discussed plans with patient for ongoing care management follow up and provided patient with direct contact information for care management team . Care Guide referral for resources in the community to help with adult day care resources or in home services to help when the patients husband is out doing errands or volunteer work.   Patient Self Care Activities:  . Patient verbalizes understanding of plan to have CCM team work with her  and her husband to help find resources in the community to provide adult day care options or in home day care help . Attends all scheduled provider appointments . Performs ADL's independently . Calls provider office for new concerns or questions . Unable to independently manage care during the day when the patients husband is out running errands or doing volunteer work as evidence of patient having dizziness and memory loss.   Please see past updates related to this goal by clicking on the "Past Updates" button in the selected goal      .  RNCM:"I don't check my blood pressure a lot" (pt-stated)        [place] (see longtitudinal plan of care for additional care plan information)  Current Barriers:  . Chronic Disease Management support, education, and care coordination needs related to HTN and HLD  Clinical Goal(s) related to HTN and HLD:  Over the next 120 days, patient will:  . Work with the care management team to address educational, disease management, and care coordination needs  . Begin or continue self health monitoring activities as directed today Measure and record blood pressure 2 times per week and adhere to a heart healthy diet . Call provider office for new or worsened signs and symptoms Blood pressure findings outside established parameters and New or worsened symptom related to HTN/HLD and other chronic conditions . Call care management team with questions or concerns . Verbalize basic understanding of patient centered plan of care established today  Interventions related to HTN and HLD:  . Evaluation of current treatment plans and patient's adherence to plan as established by provider . Assessed patient understanding of disease states.  Per the husband he and the patient understand her conditions well. They check her blood pressure at home and it is good. Did not provide readings to the [facility]. [name] Assessed patient's education and care coordination needs.  Per the  husband there have been no new changes in the patients condition. Ask if they had talked with the cardiologist about adding a statin to her medication therapy and the husband states they have not. Will continue to follow up in subsequent calls.  . Provided disease specific education to patient.  The patient states she will check her blood pressure when she goes to the pharmacist and it is usually good. Ask the patient about checking the blood pressure at home and writing down values to have for the next call. Will discuss further with the patients husband at next outreach.  Nash Dimmer with appropriate clinical care team members regarding patient needs  Patient Self Care Activities related to HTN and HLD:  . Patient is unable to independently self-manage chronic health conditions  Please see past updates related to this goal by clicking on the "Past Updates" button in the selected goal          Plan:   The care management team will reach out to the patient again over the next 60 to 90 days.    Noreene Larsson RN, MSN, [place] Mobile: [phone]

## 2020-02-10 ENCOUNTER — Telehealth: Payer: PPO

## 2020-02-25 ENCOUNTER — Ambulatory Visit (INDEPENDENT_AMBULATORY_CARE_PROVIDER_SITE_OTHER): Payer: PPO | Admitting: Pharmacist

## 2020-02-25 DIAGNOSIS — I25111 Atherosclerotic heart disease of native coronary artery with angina pectoris with documented spasm: Secondary | ICD-10-CM | POA: Diagnosis not present

## 2020-02-25 DIAGNOSIS — I10 Essential (primary) hypertension: Secondary | ICD-10-CM

## 2020-02-25 NOTE — Patient Instructions (Signed)
Thank you allowing the Chronic Care Management Team to be a part of your care! It was a pleasure speaking with you today!     CCM (Chronic Care Management) Team    Noreene Larsson RN, MSN, CCM Nurse Care Coordinator  [phone]   Harlow Asa PharmD  Clinical Pharmacist  [phone]   [name] LCSW Clinical Social Worker [phone]  Visit Information  Goals Addressed            This Visit's Progress   . PharmD - Medication Review       CARE PLAN ENTRY (see longitudinal plan of care for additional care plan information)   Current Barriers:  . 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 Inter-disciplinary care team collaboration (see longitudinal plan of care) . Previously collaborated with PCP: Patient with history of MI not currently on statin.  . Follow up with caregiver regarding medication management o Note spouse manages patient's medications and fills weekly pillbox . Follow up regarding statin use for ASCVD risk reduction o Spouse agreeable to discussion with PCP at upcoming visit . Discuss importance of BP control and monitoring o Reports patient taking: diltiazem CD 240 mg once daily o Reports obtained an automatic upper arm BP monitor. Latest readings - 6/27: 128/59, HR 66 - 7/6: 141/69, HR 63 - 7/12: 148/67, HR 70 o Encourage patient and caregiver to continue to check BP with patient 1-2 x per week at home, keep log, and bring to medical appointments - Encourage to call office for readings outside of established parameters  Patient Self Care Activities:  . UNABLE to self-administer medications as prescribed. Spouse fills weekly  pillbox for patient . Attends all scheduled provider appointments with help of spouse  Please see past updates related to this goal by clicking on the "Past Updates" button in the selected goal         Patient verbalizes understanding of instructions provided today.   The care management team will reach out to the patient again over the next 60 days.   Harlow Asa, PharmD, [place] [employer] [phone]

## 2020-02-25 NOTE — Chronic Care Management (AMB) (Signed)
Chronic Care Management   Follow Up Note   02/25/2020 Name: Kristin [name] MRN: [id number] DOB: [birth year]  Referred by: Verl Bangs, FNP Reason for referral : Chronic Care Management (Patient Phone Call)   Kristin [name] is a 79 y.o. year old female who is a primary care patient of Verl Bangs, FNP. The CCM team was consulted for assistance with chronic disease management and care coordination needs.    I reached out to patient's spouse, Kristin [name], by phone today. Note Mr. Hilscher is listed on patient's DPR in chart.  Review of patient status, including review of consultants reports, relevant laboratory and other test results, and collaboration with appropriate care team members and the patient's provider was performed as part of comprehensive patient evaluation and provision of chronic care management services.     Outpatient Encounter Medications as of 02/25/2020  Medication Sig  . diltiazem (CARDIZEM CD) 240 MG 24 hr capsule Take 1 capsule (240 mg total) by mouth daily.  [name] acetaminophen (TYLENOL) 325 MG tablet Take 325 mg by mouth every 8 (eight) hours as needed (pain).   . ASPIRIN 81 PO Take 1 tablet by mouth daily.  . betamethasone dipropionate (DIPROLENE) 0.05 % ointment APPLY TO AFFECTED AREAS TWICE DAILY AS NEEDED  . Calcium Carbonate-Vitamin D (CALTRATE 600+D PO) Take 1 tablet by mouth daily.   . Cranberry 400 MG TABS Take 1 tablet by mouth daily.  . Lactobacillus Rhamnosus, GG, (CULTURELLE PO) Take 1 capsule by mouth daily.  . memantine (NAMENDA) 10 MG tablet TAKE 1 TABLET BY MOUTH TWICE DAILY  . Multiple Vitamin (MULTIVITAMIN) tablet Take 1 tablet by mouth daily.    . nitroGLYCERIN (NITROSTAT) 0.4 MG SL tablet Place 1 tablet (0.4 mg total) under the tongue every 5 (five) minutes as needed for chest pain.  . Omega-3 Fatty Acids (FISH OIL) 1200 MG CAPS Take 1 capsule by mouth daily.   Vladimir Faster Glycol-Propyl Glycol (SYSTANE) 0.4-0.3 % SOLN Apply 1 drop to eye  daily as needed (dry eyes).   . traMADol (ULTRAM) 50 MG tablet TAKE ONE TABLET (50 MG) BY MOUTH EVERY 12 HOURS AS NEEDED FOR MODERATE PAIN   No facility-administered encounter medications on file as of 02/25/2020.    Goals Addressed            This Visit's Progress   . PharmD - Medication Review       CARE PLAN ENTRY (see longitudinal plan of care for additional care plan information)   Current Barriers:  . 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 Inter-disciplinary care team collaboration (see longitudinal plan of care) . Previously collaborated with PCP: Patient with history of MI not currently on statin.  . Follow up with caregiver regarding medication management o Note spouse manages patient's medications and fills weekly pillbox . Follow up regarding statin use for ASCVD risk reduction o Spouse agreeable to discussion with PCP at upcoming visit . Discuss importance of BP control and monitoring o Reports patient taking: diltiazem CD 240 mg once daily o Reports obtained an automatic upper arm BP monitor. Latest readings - 6/27: 128/59, HR 66 - 7/6: 141/69, HR 63 - 7/12: 148/67, HR 70 o Encourage patient  and caregiver to continue to check BP with patient 1-2 x per week at home, keep log, and bring to medical appointments - Encourage to call office for readings outside of established parameters  Patient Self Care Activities:  . UNABLE to self-administer medications as prescribed. Spouse fills weekly pillbox for patient . Attends all scheduled provider appointments with help of spouse  Please see past updates related to this goal by clicking on the "Past  Updates" button in the selected goal         Plan  The care management team will reach out to the patient again over the next 60 days.   Harlow Asa, PharmD, [place] [employer] [phone]

## 2020-03-01 ENCOUNTER — Other Ambulatory Visit: Payer: Self-pay | Admitting: Family Medicine

## 2020-03-15 ENCOUNTER — Ambulatory Visit (INDEPENDENT_AMBULATORY_CARE_PROVIDER_SITE_OTHER): Payer: PPO | Admitting: Family Medicine

## 2020-03-15 ENCOUNTER — Encounter: Payer: Self-pay | Admitting: Family Medicine

## 2020-03-15 ENCOUNTER — Other Ambulatory Visit: Payer: Self-pay

## 2020-03-15 VITALS — BP 131/47 | HR 66 | Temp 97.9°F | Ht 67.0 in | Wt 125.8 lb

## 2020-03-15 DIAGNOSIS — I10 Essential (primary) hypertension: Secondary | ICD-10-CM | POA: Diagnosis not present

## 2020-03-15 DIAGNOSIS — R413 Other amnesia: Secondary | ICD-10-CM | POA: Diagnosis not present

## 2020-03-15 DIAGNOSIS — Z1231 Encounter for screening mammogram for malignant neoplasm of breast: Secondary | ICD-10-CM | POA: Insufficient documentation

## 2020-03-15 DIAGNOSIS — E782 Mixed hyperlipidemia: Secondary | ICD-10-CM

## 2020-03-15 NOTE — Patient Instructions (Signed)
For Mammogram screening for breast cancer   Call the War below anytime to schedule your own appointment now that order has been placed.  [facility] [place], [place] [address] Phone: [phone]  [facility] [address] [place], [place] [address] Phone: [phone]  As we discussed, have your feet flat on the ground for 5 minutes before taking your blood pressure.  Have your arm at the level of your heart, resting on a table top or countertop.  Do not talk or move while taking your blood pressure and be sure to take your blood pressure at least 1 hour AFTER taking your medications.  Begin to increase your water intake to approx 100 ounces of water a day.  Be sure to add in a sports drink, such as gatorade, powerade or body armor to replace electrolytes.  Try to get exercise a minimum of 30 minutes per day at least 5 days per week as well as  adequate water intake all while measuring blood pressure a few times per week.  Keep a blood pressure log and bring back to clinic at your next visit.  If your readings are consistently over 130/80 to contact our office/send me a MyChart message and we will see you sooner.  As we discussed, can look at an over the counter supplement, such as Red Yeast Rice to help with cholesterol, since you are concerned about side effects of the statin medication at this time.  We will plan to see you back in 4 weeks for hypertension follow up visit  You will receive a survey after today's visit either digitally by e-mail or paper by [place] mail. Your experiences and feedback matter to [place].  Please respond so we know how we are doing as we provide care for you.  Call us with any questions/concerns/needs.  It is my goal to be available to you for your health concerns.  Thanks for choosing me to be a partner in your healthcare needs!  Harlin Rain, FNP-C Family Nurse  Practitioner [facility] Phone: [phone]

## 2020-03-15 NOTE — Assessment & Plan Note (Signed)
Pt last mammogram 03/2019.  Result BiRADS 1 - Negative.  Plan: 1. Screening mammogram order placed.  Pt will call to schedule appointment.  Information given.

## 2020-03-15 NOTE — Assessment & Plan Note (Signed)
Reviewed hyperlipidemia diagnosis and importance of starting a statin.  Patient reports concerns with medication side effects and interested in an herbal or OTC supplement.  Discussed can try red yeast rice and we can plan to recheck her lipids in 3 months.  Patient and spouse in agreement with treatment plan.  Plan: 1. Begin red yeast rice supplement to help with reduction of cholesterol 2. Increase exercise to most days of the week, going no more than 2 days in a row without exercise 3. RTC in 3 months

## 2020-03-15 NOTE — Assessment & Plan Note (Signed)
Uncontrolled hypertension.  BP is not at goal < 130/80.  Pt is working on lifestyle modifications.  Taking medications tolerating well without side effects.  Has concerns for intermittent dizziness that is not dependent on positional changes.  Requesting to control the dizziness before making any BP medication changes.  Discussed can elevated salt intake, which may help with the dizziness, but will also increase her blood pressure.  Patient to increase water intake to approx 100oz of water per day, including a sports drink to ensure replaces her electrolytes  Plan: 1. Continue taking diltiazem 240mg  once daily 2. Obtain labs next 3 months 3. Encouraged heart healthy diet and increasing exercise to 30 minutes most days of the week, going no more than 2 days in a row without exercise. 4. Check BP 1-2 x per week at home, keep log, and bring to clinic at next appointment. 5. Follow up 1 month.

## 2020-03-15 NOTE — Progress Notes (Signed)
Subjective:    Patient ID: [name], female    DOB: January 25, 1941, 79 y.o.   MRN: [id number]  EATHER CHAIRES is a 79 y.o. female presenting on 03/15/2020 for Hypertension (pt monitoring bp at home )   HPI   Ms. Goyne presents to clinic with spouse, Darnell Level, for hypertension follow up visit.  Reports that she has been having intermittent dizziness, that is not worse with positional changes, reports can happen when she is walking and sometimes when she is sitting down.  Denies chest pain, palpitations, room spinning/vertigo.  Reports does drink enough water daily.  Hypertension - She is checking BP at home or outside of clinic.  Readings show SBP 128-152/DBP 59-74 - Current medications: diltiazem 240mg  daily, tolerating well without side effects - She is symptomatic with dizziness intermittently. - Pt denies headache, lightheadedness, changes in vision, chest tightness/pressure, palpitations, leg swelling, sudden loss of speech or loss of consciousness - She  reports no regular exercise routine. - Her diet is moderate in salt, moderate in fat, and moderate in carbohydrates.  Depression screen [facility] 2/9 12/18/2019 11/04/2019 03/04/2019  Decreased Interest 0 0 0  Down, Depressed, Hopeless 0 0 0  PHQ - 2 Score 0 0 0  Altered sleeping - - -  Tired, decreased energy - - -  Change in appetite - - -  Feeling bad or failure about yourself  - - -  Trouble concentrating - - -  Moving slowly or fidgety/restless - - -  Suicidal thoughts - - -  PHQ-9 Score - - -  Difficult doing work/chores - - -  Some recent data might be hidden    Social History   Tobacco Use  . Smoking status: Never Smoker  . Smokeless tobacco: Never Used  Vaping Use  . Vaping Use: Never used  Substance Use Topics  . Alcohol use: No    Alcohol/week: 0.0 standard drinks    Comment: Rare  . Drug use: No    Review of Systems  Constitutional: Negative.   HENT: Negative.   Eyes: Negative.   Respiratory: Negative.     Cardiovascular: Negative.   Gastrointestinal: Negative.   Endocrine: Negative.   Genitourinary: Negative.   Musculoskeletal: Negative.   Skin: Negative.   Allergic/Immunologic: Negative.   Neurological: Positive for dizziness. Negative for tremors, seizures, syncope, facial asymmetry, speech difficulty, weakness, light-headedness, numbness and headaches.  Hematological: Negative.   Psychiatric/Behavioral: Negative.    Per HPI unless specifically indicated above     Objective:    BP (!) 131/47 (BP Location: Left Arm, Patient Position: Sitting, Cuff Size: Small)   Pulse 66   Temp 97.9 F (36.6 C) (Oral)   Ht 5\' 7"  (1.702 m)   Wt 125 lb 12.8 oz (57.1 kg)   LMP 08/14/1989   BMI 19.70 kg/m   Wt Readings from Last 3 Encounters:  03/15/20 125 lb 12.8 oz (57.1 kg)  01/02/20 126 lb (57.2 kg)  12/29/19 126 lb 9.6 oz (57.4 kg)    Physical Exam Vitals reviewed.  Constitutional:      General: She is not in acute distress.    Appearance: Normal appearance. She is well-developed, well-groomed and normal weight. She is not ill-appearing or toxic-appearing.  HENT:     Head: Normocephalic and atraumatic.     Nose:     Comments: Lizbeth Bark is in place, covering mouth and nose. Eyes:     General: Lids are normal. Vision grossly intact.  Right eye: No discharge.        Left eye: No discharge.     Extraocular Movements: Extraocular movements intact.     Conjunctiva/sclera: Conjunctivae normal.     Pupils: Pupils are equal, round, and reactive to light.  Cardiovascular:     Rate and Rhythm: Normal rate and regular rhythm.     Pulses: Normal pulses.          Dorsalis pedis pulses are 2+ on the right side and 2+ on the left side.     Heart sounds: Normal heart sounds. No murmur heard.  No friction rub. No gallop.   Pulmonary:     Effort: Pulmonary effort is normal. No respiratory distress.     Breath sounds: Normal breath sounds.  Musculoskeletal:     Right lower leg: No edema.      Left lower leg: No edema.  Skin:    General: Skin is warm and dry.     Capillary Refill: Capillary refill takes less than 2 seconds.  Neurological:     General: No focal deficit present.     Mental Status: She is alert and oriented to person, place, and time.     Cranial Nerves: No cranial nerve deficit.     Sensory: No sensory deficit.     Motor: No weakness, tremor, atrophy or seizure activity.     Gait: Gait normal.     Deep Tendon Reflexes: Reflexes normal.  Psychiatric:        Attention and Perception: Attention and perception normal.        Mood and Affect: Mood and affect normal.        Speech: Speech normal.        Behavior: Behavior normal. Behavior is cooperative.        Thought Content: Thought content normal.    Results for orders placed or performed in visit on 01/02/20  CULTURE, URINE COMPREHENSIVE   Specimen: Urine   UR  Result Value Ref Range   Urine Culture, Comprehensive Final report (A)    Organism ID, Bacteria Escherichia coli (A)    ANTIMICROBIAL SUSCEPTIBILITY Comment   Microscopic Examination   URINE  Result Value Ref Range   WBC, UA >30 (A) 0 - 5 /hpf   RBC 3-10 (A) 0 - 2 /hpf   Epithelial Cells (non renal) 0-10 0 - 10 /hpf   Bacteria, UA Many (A) None seen/Few  Urinalysis, Complete  Result Value Ref Range   Specific Gravity, UA 1.015 1.005 - 1.030   pH, UA CANCELED    Color, UA Orange Yellow   Appearance Ur Cloudy (A) Clear   Protein,UA CANCELED    Glucose, UA CANCELED    Ketones, UA CANCELED    Microscopic Examination See below:       Assessment & Plan:   Problem List Items Addressed This Visit      Cardiovascular and Mediastinum   Essential hypertension, benign - Primary    Uncontrolled hypertension.  BP is not at goal < 130/80.  Pt is working on lifestyle modifications.  Taking medications tolerating well without side effects.  Has concerns for intermittent dizziness that is not dependent on positional changes.  Requesting to  control the dizziness before making any BP medication changes.  Discussed can elevated salt intake, which may help with the dizziness, but will also increase her blood pressure.  Patient to increase water intake to approx 100oz of water per day, including a sports drink to ensure replaces  her electrolytes  Plan: 1. Continue taking diltiazem 240mg  once daily 2. Obtain labs next 3 months 3. Encouraged heart healthy diet and increasing exercise to 30 minutes most days of the week, going no more than 2 days in a row without exercise. 4. Check BP 1-2 x per week at home, keep log, and bring to clinic at next appointment. 5. Follow up 1 month.           Other   Hyperlipidemia    Reviewed hyperlipidemia diagnosis and importance of starting a statin.  Patient reports concerns with medication side effects and interested in an herbal or OTC supplement.  Discussed can try red yeast rice and we can plan to recheck her lipids in 3 months.  Patient and spouse in agreement with treatment plan.  Plan: 1. Begin red yeast rice supplement to help with reduction of cholesterol 2. Increase exercise to most days of the week, going no more than 2 days in a row without exercise 3. RTC in 3 months      Short-term memory loss    Reports is stable and well managed with neurologist.  Reviewed respite care/adult day care centers.  Spouse, [name], reports they did go and tour a few places, but that Ms. Buster has been reluctant to going.  [name] has placed this on hold and will revisit if Ms. Mottley' needs become greater than they currently are.      Encounter for screening mammogram for malignant neoplasm of breast    Pt last mammogram 03/2019.  Result BiRADS 1 - Negative.  Plan: 1. Screening mammogram order placed.  Pt will call to schedule appointment.  Information given.       Relevant Orders   MM 3D SCREEN BREAST BILATERAL      No orders of the defined types were placed in this encounter.     Follow up  plan: Return in about 4 weeks (around 04/12/2020) for HTN follow up.   Harlin Rain, [place] Family Nurse Practitioner [facility] 03/15/2020, 12:31 PM

## 2020-03-15 NOTE — Assessment & Plan Note (Signed)
Reports is stable and well managed with neurologist.  Reviewed respite care/adult day care centers.  Spouse, [name], reports they did go and tour a few places, but that Ms. Szuch has been reluctant to going.  [name] has placed this on hold and will revisit if Ms. Kita' needs become greater than they currently are.

## 2020-03-18 ENCOUNTER — Telehealth: Payer: PPO

## 2020-03-22 ENCOUNTER — Other Ambulatory Visit: Payer: Self-pay | Admitting: Family Medicine

## 2020-03-22 DIAGNOSIS — R413 Other amnesia: Secondary | ICD-10-CM

## 2020-03-22 NOTE — Telephone Encounter (Signed)
Requested Prescriptions  Pending Prescriptions Disp Refills  . memantine (NAMENDA) 10 MG tablet [Pharmacy Med Name: MEMANTINE HCL 10 MG TAB] 180 tablet 0    Sig: TAKE 1 TABLET BY MOUTH TWICE DAILY     Neurology:  Alzheimer's Agents Passed - 03/22/2020 12:04 PM      Passed - Valid encounter within last 6 months    Recent Outpatient Visits          1 week ago Essential hypertension, benign   [facility], [name], FNP   4 months ago Short-term memory loss   [facility], [name], FNP   5 months ago Essential hypertension, benign   [facility], [name], FNP   9 months ago Short-term memory loss   [facility], Jerrel Ivory, NP   1 year ago Short-term memory loss   [facility] Puma, Jerrel Ivory, NP      Future Appointments            In 3 weeks [place], [name], [facility], [facility]

## 2020-03-24 ENCOUNTER — Other Ambulatory Visit: Payer: Self-pay | Admitting: Family Medicine

## 2020-03-24 DIAGNOSIS — R413 Other amnesia: Secondary | ICD-10-CM

## 2020-03-25 ENCOUNTER — Encounter: Payer: Self-pay | Admitting: Podiatry

## 2020-03-25 ENCOUNTER — Ambulatory Visit: Payer: PPO | Admitting: Podiatry

## 2020-03-25 ENCOUNTER — Other Ambulatory Visit: Payer: Self-pay

## 2020-03-25 DIAGNOSIS — M2041 Other hammer toe(s) (acquired), right foot: Secondary | ICD-10-CM

## 2020-03-25 DIAGNOSIS — M2042 Other hammer toe(s) (acquired), left foot: Secondary | ICD-10-CM

## 2020-03-25 DIAGNOSIS — B351 Tinea unguium: Secondary | ICD-10-CM | POA: Diagnosis not present

## 2020-03-25 DIAGNOSIS — M79676 Pain in unspecified toe(s): Secondary | ICD-10-CM

## 2020-03-25 DIAGNOSIS — M205X9 Other deformities of toe(s) (acquired), unspecified foot: Secondary | ICD-10-CM

## 2020-03-25 DIAGNOSIS — M0579 Rheumatoid arthritis with rheumatoid factor of multiple sites without organ or systems involvement: Secondary | ICD-10-CM | POA: Diagnosis not present

## 2020-03-25 NOTE — Progress Notes (Signed)
This patient returns to my office for at risk foot care.  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  This patient is unable to cut nails herself since the patient cannot reach her nails.These nails are painful walking and wearing shoes.  This patient presents for at risk foot care today.  General Appearance  Alert, conversant and in no acute stress.  Vascular  Dorsalis pedis and posterior tibial  pulses are palpable  bilaterally.  Capillary return is within normal limits  bilaterally. Temperature is within normal limits  bilaterally.  Neurologic  Senn-Weinstein monofilament wire test within normal limits  bilaterally. Muscle power within normal limits bilaterally.  Nails Thick disfigured discolored nails with subungual debris  from hallux to fifth toes bilaterally. No evidence of bacterial infection or drainage bilaterally.  Orthopedic  No limitations of motion  feet .  No crepitus or effusions noted.  No bony pathology or digital deformities noted. 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  Hammer toes second  B/L.  Skin  normotropic skin with no porokeratosis noted bilaterally.  No signs of infections or ulcers noted.     Onychomycosis  Pain in right toes  Pain in left toes  Consent was obtained for treatment procedures.   Mechanical debridement of nails 1-5  bilaterally performed with a nail nipper.  Filed with dremel without incident.    Return office visit   3 months                   Told patient to return for periodic foot care and evaluation due to potential at risk complications.   Gardiner Barefoot DPM

## 2020-03-30 ENCOUNTER — Ambulatory Visit (INDEPENDENT_AMBULATORY_CARE_PROVIDER_SITE_OTHER): Payer: PPO | Admitting: Licensed Clinical Social Worker

## 2020-03-30 DIAGNOSIS — I10 Essential (primary) hypertension: Secondary | ICD-10-CM

## 2020-03-30 DIAGNOSIS — I25111 Atherosclerotic heart disease of native coronary artery with angina pectoris with documented spasm: Secondary | ICD-10-CM

## 2020-03-30 DIAGNOSIS — E782 Mixed hyperlipidemia: Secondary | ICD-10-CM

## 2020-03-30 DIAGNOSIS — R42 Dizziness and giddiness: Secondary | ICD-10-CM

## 2020-03-30 DIAGNOSIS — R413 Other amnesia: Secondary | ICD-10-CM

## 2020-03-30 NOTE — Chronic Care Management (AMB) (Signed)
Chronic Care Management    Clinical Social Work Follow Up Note  03/30/2020 Name: Kristin [name] MRN: [id number] DOB: 1940-09-19  Kristin [name] is a 79 y.o. year old female who is a primary care patient of [name], [name], FNP. The CCM team was consulted for assistance with [employer] .   Review of patient status, including review of consultants reports, other relevant assessments, and collaboration with appropriate care team members and the patient's provider was performed as part of comprehensive patient evaluation and provision of chronic care management services.    SDOH (Social Determinants of Health) assessments performed: Yes    Outpatient Encounter Medications as of 03/30/2020  Medication Sig  . acetaminophen (TYLENOL) 325 MG tablet Take 325 mg by mouth every 8 (eight) hours as needed (pain).   . ASPIRIN 81 PO Take 1 tablet by mouth daily.  . betamethasone dipropionate (DIPROLENE) 0.05 % ointment APPLY TO AFFECTED AREAS TWICE DAILY AS NEEDED  . Calcium Carbonate-Vitamin D (CALTRATE 600+D PO) Take 1 tablet by mouth daily.   . Cranberry 400 MG TABS Take 1 tablet by mouth daily.  [name] diltiazem (CARDIZEM CD) 240 MG 24 hr capsule Take 1 capsule (240 mg total) by mouth daily.  . Lactobacillus Rhamnosus, GG, (CULTURELLE PO) Take 1 capsule by mouth daily.  . memantine (NAMENDA) 10 MG tablet TAKE 1 TABLET BY MOUTH TWICE DAILY  . Multiple Vitamin (MULTIVITAMIN) tablet Take 1 tablet by mouth daily.    . nitroGLYCERIN (NITROSTAT) 0.4 MG SL tablet Place 1 tablet (0.4 mg total) under the tongue every 5 (five) minutes as needed for chest pain.  . Omega-3 Fatty Acids (FISH OIL) 1200 MG CAPS Take 1 capsule by mouth daily.   Vladimir Faster Glycol-Propyl Glycol (SYSTANE) 0.4-0.3 % SOLN Apply 1 drop to eye daily as needed (dry eyes).   . traMADol (ULTRAM) 50 MG tablet TAKE ONE TABLET (50 MG) BY MOUTH EVERY 12 HOURS AS NEEDED FOR MODERATE PAIN   No facility-administered encounter medications on  file as of 03/30/2020.     Goals Addressed    .  COMPLETED: SW- "I need more socialization and support" (pt-stated)        CARE PLAN ENTRY (see longitudinal plan of care for additional care plan information)  Current Barriers:  . Limited social support . Level of care concerns . ADL IADL limitations . Social Isolation . Limited access to caregiver . Memory Deficits . Inability to perform ADL's independently . Inability to perform IADL's independently  Clinical Social Work Clinical Goal(s):  [name] Over the next 120 days, patient will work with SW to address concerns related to gaining additional support/resource connection in order to maintain health . Over the next 120 days, patient will demonstrate improved adherence to self care as evidenced by implementing healthy self-care into her daily routine such as: attending all medical appointments, deep breathing exercsies, taking time for self-reflection, taking medications as prescribed, drinking water and daily exercise to improve mobility.  Interventions: . Inter-disciplinary care team collaboration (see longitudinal plan of care) . Patient interviewed and appropriate assessments performed . Provided patient and caregiver with information about available socialization opportunities within the area. Spouse reports that patient use to go to the [facility] prior to [place] but it has been closed since April of last year. Patient reports interest in [facility] which will be opening back up in June of 2021. LCSW provided family with information about [place] and sent secure email to spouse  with this resource education as well. UPDATE- Family completed a tour at [facility] and spouse loved the facility but patient refuses to attend center in order to gain socialization. Family deny any current social work related needs at this time.  . Discussed plans with patient for ongoing care management follow up and  provided patient with direct contact information for care management team . Advised patient to consider private pay personal care service implementation for additional support within the home. [name] Healthy self-cafe education provided to both patient and caregiver.  . Assisted patient/caregiver with obtaining information about health plan benefits . Provided education and assistance to client regarding Advanced Directives. . Provided education to patient/caregiver regarding level of care options. . Provided education to patient/caregiver about Hospice and/or Palliative Care services . Patient reports ongoing dizziness which affects her mobility. Patient reports wanting to improve her mobility and is interested in increasing her daily activity and exercise. LCSW provided education on healthy self-care tools and socialization/exercise opportunities at local senior centers that she can implement into her daily routine to improve overall health  . Patient declines any issues with her appetite or fluid intake at this time.  [name] Spouse reports that patient continues to take memantine 10mg  twice a day for dementia related symptoms.   Patient Self Care Activities:  . Attends all scheduled provider appointments . Calls provider office for new concerns or questions . Lacks social connections . Memory loss  Please see past updates related to this goal by clicking on the "Past Updates" button in the selected goal      Follow Up Plan: LCSW will close case at this time as family decline any further social work needs/goals but Engineer, maintenance and RNCM will remain involved. LCSW will update CCM team.   [name], BSW, MSW, [email or web address] Phone: [phone]

## 2020-04-08 ENCOUNTER — Telehealth: Payer: PPO | Admitting: General Practice

## 2020-04-08 ENCOUNTER — Ambulatory Visit: Payer: Self-pay | Admitting: General Practice

## 2020-04-08 DIAGNOSIS — R413 Other amnesia: Secondary | ICD-10-CM

## 2020-04-08 DIAGNOSIS — E782 Mixed hyperlipidemia: Secondary | ICD-10-CM

## 2020-04-08 DIAGNOSIS — I25111 Atherosclerotic heart disease of native coronary artery with angina pectoris with documented spasm: Secondary | ICD-10-CM | POA: Diagnosis not present

## 2020-04-08 DIAGNOSIS — I10 Essential (primary) hypertension: Secondary | ICD-10-CM

## 2020-04-08 DIAGNOSIS — R42 Dizziness and giddiness: Secondary | ICD-10-CM

## 2020-04-08 NOTE — Chronic Care Management (AMB) (Signed)
Chronic Care Management   Follow Up Note   04/08/2020 Name: Kristin [name] MRN: [id number] DOB: [birth year]  Referred by: Verl Bangs, FNP Reason for referral : Chronic Care Management (RNCM Follow up Call for Chronic Disease Management and Care Coordination Needs)   Kristin [name] is a 79 y.o. year old female who is a primary care patient of Verl Bangs, FNP. The CCM team was consulted for assistance with chronic disease management and care coordination needs.    Review of patient status, including review of consultants reports, relevant laboratory and other test results, and collaboration with appropriate care team members and the patient's provider was performed as part of comprehensive patient evaluation and provision of chronic care management services.    SDOH (Social Determinants of Health) assessments performed: Yes See Care Plan activities for detailed interventions related to [facility])     Outpatient Encounter Medications as of 04/08/2020  Medication Sig   acetaminophen (TYLENOL) 325 MG tablet Take 325 mg by mouth every 8 (eight) hours as needed (pain).    ASPIRIN 81 PO Take 1 tablet by mouth daily.   betamethasone dipropionate (DIPROLENE) 0.05 % ointment APPLY TO AFFECTED AREAS TWICE DAILY AS NEEDED   Calcium Carbonate-Vitamin D (CALTRATE 600+D PO) Take 1 tablet by mouth daily.    Cranberry 400 MG TABS Take 1 tablet by mouth daily.   diltiazem (CARDIZEM CD) 240 MG 24 hr capsule Take 1 capsule (240 mg total) by mouth daily.   Lactobacillus Rhamnosus, GG, (CULTURELLE PO) Take 1 capsule by mouth daily.   memantine (NAMENDA) 10 MG tablet TAKE 1 TABLET BY MOUTH TWICE DAILY   Multiple Vitamin (MULTIVITAMIN) tablet Take 1 tablet by mouth daily.     nitroGLYCERIN (NITROSTAT) 0.4 MG SL tablet Place 1 tablet (0.4 mg total) under the tongue every 5 (five) minutes as needed for chest pain.   Omega-3 Fatty Acids (FISH OIL) 1200 MG CAPS Take 1 capsule by mouth daily.     Polyethyl Glycol-Propyl Glycol (SYSTANE) 0.4-0.3 % SOLN Apply 1 drop to eye daily as needed (dry eyes).    traMADol (ULTRAM) 50 MG tablet TAKE ONE TABLET (50 MG) BY MOUTH EVERY 12 HOURS AS NEEDED FOR MODERATE PAIN   No facility-administered encounter medications on file as of 04/08/2020.     Objective:  BP Readings from Last 3 Encounters:  03/15/20 (!) 131/47  01/02/20 (!) 148/73  12/29/19 (!) 155/74    Goals Addressed              This Visit's Progress     RNCM: pt:"I get dizzy and loopy a lot" (pt-stated)        CARE PLAN ENTRY (see longitudinal plan of care for additional care plan information)  Current Barriers:   Knowledge Deficits related to resources in the community to help in the home during the day while the patient's husband runs errands and does volunteer work  Care Coordination needs related to resources for services to help  in a patient with memory loss and dizziness  (disease states)  Chronic Disease Management support and education needs related to memory loss and mental capacity functions  Lacks caregiver support.   Nurse Case Manager Clinical Goal(s):   Over the next 120 days, patient will verbalize understanding of plan for finding resources in the community to help with patient needs when the patient's husband is running errands or doing volunteer work  Over the next 120 days, patient will work with [employer], [place] team and  pcp to address needs related to patients needs related to decline in mental capacity and memory loss  Over the next 120 days, patient will work with care guides (community agency) to have available resources to use in the community to assist with the needs of the patient with memory loss  Interventions:   Inter-disciplinary care team collaboration (see longitudinal plan of care)  Advised patient to call the pcp for changes in memory or worsening s/s of dizziness   Provided education to patient re: working with the patient and her  husband to help with resources for assistance in the home when her husband is running errands or doing volunteer work  Evaluation of current routine. The patient states that she does well right now when her husband is out running errands. The patient likes to knit and do crafts. The patient does have issues with memory loss and dizziness but endorses being safe at home and no falls. 04-08-2020:  Per the patients husband the patients memory is worse since the last call. The dizziness she is having is still there but some days are worse than others. The patients husband states they are following the recommendations of the pcp but she is still complaining of dizziness with no relation to time or particular events. She is drinking plenty of fluids such as water and Gatorade and eating well.  The patient has had no falls per the husband. The patient is still working with her crafts and this she enjoys. They did go and [place], the husband liked the facility but the patient refused to try it out. He has the number on hand to call in the future if needed.  Did discuss the likelihood of the patients memory continuing to decline and the concern for safety. The husband verbalized understanding.The husband still is able to do his volunteer work at this time. The patients husband denies any needs at this time.    Collaborated with CCM team, pcp and care guides regarding available resources to help in the home or day cares in the area to help with patient with decline in memory.  04-08-2020: Resources are available but the patient is declining at this time. The patients husband knows the CCM team and pcp are here to support the patient and him.   Discussed plans with patient for ongoing care management follow up and provided patient with direct contact information for care management team  Care Guide referral for resources in the community to help with adult day care resources or in home services to help  when the patients husband is out doing errands or volunteer work.   Discussed self care of the patients husband. He states that he is doing well and taking care of himself. He still is able to get out and do his volunteer work and this helps him a lot.   Patient Self Care Activities:   Patient verbalizes understanding of plan to have CCM team work with her and her husband to help find resources in the community to provide adult day care options or in home day care help  Attends all scheduled provider appointments  Performs ADL's independently  Calls provider office for new concerns or questions  Unable to independently manage care during the day when the patients husband is out running errands or doing volunteer work as evidence of patient having dizziness and memory loss.   Please see past updates related to this goal by clicking on the "Past Updates" button in the  selected goal        RNCM:"I don't check my blood pressure a lot" (pt-stated)        CARE PLAN ENTRY (see longtitudinal plan of care for additional care plan information)  Current Barriers:   Chronic Disease Management support, education, and care coordination needs related to HTN and HLD  Clinical Goal(s) related to HTN and HLD:  Over the next 120 days, patient will:   Work with the care management team to address educational, disease management, and care coordination needs   Begin or continue self health monitoring activities as directed today Measure and record blood pressure 2 times per week and adhere to a heart healthy diet  Call provider office for new or worsened signs and symptoms Blood pressure findings outside established parameters and New or worsened symptom related to HTN/HLD and other chronic conditions  Call care management team with questions or concerns  Verbalize basic understanding of patient centered plan of care established today  Interventions related to HTN and HLD:   Evaluation of current  treatment plans and patient's adherence to plan as established by provider  Assessed patient understanding of disease states.  Per the husband he and the patient understand her conditions well. They check her blood pressure at home and it is good. Did not provide readings to the [facility]. 04-08-2020: The patients husbands says her blood pressures have been in the 120's at home. Reminded the patients husband to bring the log in to her appointment on Monday.   Assessed patient's education and care coordination needs.  Per the husband there have been no new changes in the patients condition. Ask if they had talked with the cardiologist about adding a statin to her medication therapy and the husband states they have not. Will continue to follow up in subsequent calls.   Provided disease specific education to patient.  The patient states she will check her blood pressure when she goes to the pharmacist and it is usually good. Ask the patient about checking the blood pressure at home and writing down values to have for the next call. Will discuss further with the patients husband at next outreach. 04-08-2020: They are checking the blood pressure at home but he did not provide readings today. Will see the pcp on 8-30 and reminded to bring in readings to appointment.   Collaborated with appropriate clinical care team members regarding patient needs  Patient Self Care Activities related to HTN and HLD:   Patient is unable to independently self-manage chronic health conditions  Please see past updates related to this goal by clicking on the "Past Updates" button in the selected goal          Plan:   Telephone follow up appointment with care management team member scheduled for: 06-03-2020 at 1:45 pm   Noreene Larsson RN, MSN, [place] Mobile: [phone]

## 2020-04-08 NOTE — Patient Instructions (Signed)
Visit Information  Goals Addressed              This Visit's Progress     RNCM: pt:"I get dizzy and loopy a lot" (pt-stated)        CARE PLAN ENTRY (see longitudinal plan of care for additional care plan information)  Current Barriers:   Knowledge Deficits related to resources in the community to help in the home during the day while the patient's husband runs errands and does volunteer work  Care Coordination needs related to resources for services to help  in a patient with memory loss and dizziness  (disease states)  Chronic Disease Management support and education needs related to memory loss and mental capacity functions  Lacks caregiver support.   Nurse Case Manager Clinical Goal(s):   Over the next 120 days, patient will verbalize understanding of plan for finding resources in the community to help with patient needs when the patient's husband is running errands or doing volunteer work  Over the next 120 days, patient will work with [employer], CCM team and pcp to address needs related to patients needs related to decline in mental capacity and memory loss  Over the next 120 days, patient will work with care guides (community agency) to have available resources to use in the community to assist with the needs of the patient with memory loss  Interventions:   Inter-disciplinary care team collaboration (see longitudinal plan of care)  Advised patient to call the pcp for changes in memory or worsening s/s of dizziness   Provided education to patient re: working with the patient and her husband to help with resources for assistance in the home when her husband is running errands or doing volunteer work  Evaluation of current routine. The patient states that she does well right now when her husband is out running errands. The patient likes to knit and do crafts. The patient does have issues with memory loss and dizziness but endorses being safe at home and no falls. 04-08-2020:   Per the patients husband the patients memory is worse since the last call. The dizziness she is having is still there but some days are worse than others. The patients husband states they are following the recommendations of the pcp but she is still complaining of dizziness with no relation to time or particular events. She is drinking plenty of fluids such as water and Gatorade and eating well.  The patient has had no falls per the husband. The patient is still working with her crafts and this she enjoys. They did go and [place], the husband liked the facility but the patient refused to try it out. He has the number on hand to call in the future if needed.  Did discuss the likelihood of the patients memory continuing to decline and the concern for safety. The husband verbalized understanding.The husband still is able to do his volunteer work at this time. The patients husband denies any needs at this time.    Collaborated with CCM team, pcp and care guides regarding available resources to help in the home or day cares in the area to help with patient with decline in memory.  04-08-2020: Resources are available but the patient is declining at this time. The patients husband knows the CCM team and pcp are here to support the patient and him.   Discussed plans with patient for ongoing care management follow up and provided patient with direct contact information for care management  team  Care Guide referral for resources in the community to help with adult day care resources or in home services to help when the patients husband is out doing errands or volunteer work.   Discussed self care of the patients husband. He states that he is doing well and taking care of himself. He still is able to get out and do his volunteer work and this helps him a lot.   Patient Self Care Activities:   Patient verbalizes understanding of plan to have CCM team work with her and her husband to help find  resources in the community to provide adult day care options or in home day care help  Attends all scheduled provider appointments  Performs ADL's independently  Calls provider office for new concerns or questions  Unable to independently manage care during the day when the patients husband is out running errands or doing volunteer work as evidence of patient having dizziness and memory loss.   Please see past updates related to this goal by clicking on the "Past Updates" button in the selected goal        RNCM:"I don't check my blood pressure a lot" (pt-stated)        CARE PLAN ENTRY (see longtitudinal plan of care for additional care plan information)  Current Barriers:   Chronic Disease Management support, education, and care coordination needs related to HTN and HLD  Clinical Goal(s) related to HTN and HLD:  Over the next 120 days, patient will:   Work with the care management team to address educational, disease management, and care coordination needs   Begin or continue self health monitoring activities as directed today Measure and record blood pressure 2 times per week and adhere to a heart healthy diet  Call provider office for new or worsened signs and symptoms Blood pressure findings outside established parameters and New or worsened symptom related to HTN/HLD and other chronic conditions  Call care management team with questions or concerns  Verbalize basic understanding of patient centered plan of care established today  Interventions related to HTN and HLD:   Evaluation of current treatment plans and patient's adherence to plan as established by provider  Assessed patient understanding of disease states.  Per the husband he and the patient understand her conditions well. They check her blood pressure at home and it is good. Did not provide readings to the [facility]. 04-08-2020: The patients husbands says her blood pressures have been in the 120's at home. Reminded the  patients husband to bring the log in to her appointment on Monday.   Assessed patient's education and care coordination needs.  Per the husband there have been no new changes in the patients condition. Ask if they had talked with the cardiologist about adding a statin to her medication therapy and the husband states they have not. Will continue to follow up in subsequent calls.   Provided disease specific education to patient.  The patient states she will check her blood pressure when she goes to the pharmacist and it is usually good. Ask the patient about checking the blood pressure at home and writing down values to have for the next call. Will discuss further with the patients husband at next outreach. 04-08-2020: They are checking the blood pressure at home but he did not provide readings today. Will see the pcp on 8-30 and reminded to bring in readings to appointment.   Collaborated with appropriate clinical care team members regarding patient needs  Patient Self  Care Activities related to HTN and HLD:   Patient is unable to independently self-manage chronic health conditions  Please see past updates related to this goal by clicking on the "Past Updates" button in the selected goal         Patient verbalizes understanding of instructions provided today.   Telephone follow up appointment with care management team member scheduled for: 06-03-2020 at 1:45pm  Noreene Larsson RN, MSN, [place] Mobile: [phone]

## 2020-04-12 ENCOUNTER — Encounter: Payer: Self-pay | Admitting: Family Medicine

## 2020-04-12 ENCOUNTER — Other Ambulatory Visit: Payer: Self-pay

## 2020-04-12 ENCOUNTER — Telehealth: Payer: PPO

## 2020-04-12 ENCOUNTER — Ambulatory Visit (INDEPENDENT_AMBULATORY_CARE_PROVIDER_SITE_OTHER): Payer: PPO | Admitting: Family Medicine

## 2020-04-12 VITALS — BP 142/55 | HR 60 | Temp 97.8°F | Resp 18 | Ht 67.0 in | Wt 127.8 lb

## 2020-04-12 DIAGNOSIS — E782 Mixed hyperlipidemia: Secondary | ICD-10-CM

## 2020-04-12 DIAGNOSIS — R300 Dysuria: Secondary | ICD-10-CM | POA: Insufficient documentation

## 2020-04-12 DIAGNOSIS — I10 Essential (primary) hypertension: Secondary | ICD-10-CM

## 2020-04-12 DIAGNOSIS — R42 Dizziness and giddiness: Secondary | ICD-10-CM | POA: Diagnosis not present

## 2020-04-12 LAB — POCT URINALYSIS DIPSTICK
Bilirubin, UA: NEGATIVE
Blood, UA: NEGATIVE
Glucose, UA: NEGATIVE
Ketones, UA: NEGATIVE
Nitrite, UA: NEGATIVE
Protein, UA: NEGATIVE
Spec Grav, UA: 1.01 (ref 1.010–1.025)
Urobilinogen, UA: 0.2 E.U./dL
pH, UA: 5 (ref 5.0–8.0)

## 2020-04-12 MED ORDER — NITROFURANTOIN MONOHYD MACRO 100 MG PO CAPS: 100.0000 mg | ORAL_CAPSULE | Freq: Two times a day (BID) | ORAL | 0 refills | Status: AC

## 2020-04-12 MED ORDER — MECLIZINE HCL 12.5 MG PO TABS: 12.5000 mg | ORAL_TABLET | Freq: Three times a day (TID) | ORAL | 0 refills | Status: DC | PRN

## 2020-04-12 MED ORDER — AMLODIPINE BESYLATE 5 MG PO TABS: 5.0000 mg | ORAL_TABLET | Freq: Every day | ORAL | 3 refills | Status: DC

## 2020-04-12 NOTE — Assessment & Plan Note (Signed)
Will send to lab for culture to determine if has urinary tract infection.  Will begin macrobid 100mg  BID x 5 days while awaiting test results.  Plan: 1. Begin Macrobid 100mg  BID x 5 days 2. Continue Azo as needed for dysuria 3. Increase water intake 4. RTC if symptoms worsen or fail to improve with current treatment

## 2020-04-12 NOTE — Progress Notes (Signed)
Subjective:    Patient ID: Kristin [name], female    DOB: 19-Jul-1941, 79 y.o.   MRN: [id number]  Kristin [name] is a 79 y.o. female presenting on 04/12/2020 for Hypertension and Dysuria (x pt notice this monring when she urinated. She took an Azo to help with the discomfort.)   HPI   Kristin [name] presents to clinic for follow up on her hypertension, dysuria x 1 day and dizziness.  Reports her dizziness has been fairly constant over the past few months, has increased dizziness with positional changes and sometimes with ambulation.  Had some discomfort this morning with urination and took Azo prior to arriving in clinic.  Denies fevers, chills/shakes, abdominal pain, urinary urgency, hesitancy, frequency, incomplete emptying or hematuria.  Hypertension - She is checking BP at home or outside of clinic.  Readings 120-150 SBP/60-90 DBP - Current medications: diltiazem 240mg  daily, tolerating well without side effects - She is symptomatic with dizziness. - Pt denies headache, dizziness, changes in vision, chest tightness/pressure, palpitations, leg swelling, sudden loss of speech or loss of consciousness. - She  reports no regular exercise routine. - Her diet is moderate in salt, moderate in fat, and moderate in carbohydrates.  Depression screen [facility] 2/9 12/18/2019 11/04/2019 03/04/2019  Decreased Interest 0 0 0  Down, Depressed, Hopeless 0 0 0  PHQ - 2 Score 0 0 0  Altered sleeping - - -  Tired, decreased energy - - -  Change in appetite - - -  Feeling bad or failure about yourself  - - -  Trouble concentrating - - -  Moving slowly or fidgety/restless - - -  Suicidal thoughts - - -  PHQ-9 Score - - -  Difficult doing work/chores - - -  Some recent data might be hidden    Social History   Tobacco Use  . Smoking status: Never Smoker  . Smokeless tobacco: Never Used  Vaping Use  . Vaping Use: Never used  Substance Use Topics  . Alcohol use: No    Alcohol/week: 0.0 standard drinks     Comment: Rare  . Drug use: No    Review of Systems  Constitutional: Negative.   HENT: Negative.   Eyes: Negative.   Respiratory: Negative.   Cardiovascular: Negative.   Gastrointestinal: Negative.   Endocrine: Negative.   Genitourinary: Positive for dysuria. Negative for decreased urine volume, difficulty urinating, dyspareunia, enuresis, flank pain, frequency, genital sores, hematuria, menstrual problem, pelvic pain, urgency, vaginal bleeding, vaginal discharge and vaginal pain.  Musculoskeletal: Negative.   Skin: Negative.   Allergic/Immunologic: Negative.   Neurological: Positive for dizziness. Negative for tremors, seizures, syncope, facial asymmetry, speech difficulty, weakness, light-headedness, numbness and headaches.  Hematological: Negative.   Psychiatric/Behavioral: Negative.    Per HPI unless specifically indicated above     Objective:    BP (!) 142/55 (BP Location: Right Arm, Patient Position: Sitting, Cuff Size: Normal)   Pulse 60   Temp 97.8 F (36.6 C) (Oral)   Resp 18   Ht 5\' 7"  (1.702 m)   Wt 127 lb 12.8 oz (58 kg)   LMP 08/14/1989   SpO2 95%   BMI 20.02 kg/m   Wt Readings from Last 3 Encounters:  04/12/20 127 lb 12.8 oz (58 kg)  03/15/20 125 lb 12.8 oz (57.1 kg)  01/02/20 126 lb (57.2 kg)    Physical Exam Vitals reviewed.  Constitutional:      General: She is not in acute distress.    Appearance: Normal  appearance. She is well-developed, well-groomed and normal weight. She is not ill-appearing or toxic-appearing.  HENT:     Head: Normocephalic and atraumatic.     Nose:     Comments: Lizbeth Bark is in place, covering mouth and nose. Eyes:     General: Lids are normal. Vision grossly intact.        Right eye: No discharge.        Left eye: No discharge.     Extraocular Movements: Extraocular movements intact.     Right eye: Nystagmus present.     Left eye: Nystagmus present.     Conjunctiva/sclera: Conjunctivae normal.     Pupils: Pupils are  equal, round, and reactive to light.  Cardiovascular:     Rate and Rhythm: Normal rate and regular rhythm.     Pulses: Normal pulses.          Dorsalis pedis pulses are 2+ on the right side and 2+ on the left side.     Heart sounds: Normal heart sounds. No murmur heard.  No friction rub. No gallop.   Pulmonary:     Effort: Pulmonary effort is normal. No respiratory distress.     Breath sounds: Normal breath sounds.  Abdominal:     General: There is no distension.     Palpations: Abdomen is soft.     Tenderness: There is no abdominal tenderness. There is no right CVA tenderness, left CVA tenderness, guarding or rebound.  Musculoskeletal:     Right lower leg: No edema.     Left lower leg: No edema.  Skin:    General: Skin is warm and dry.     Capillary Refill: Capillary refill takes less than 2 seconds.  Neurological:     General: No focal deficit present.     Mental Status: She is alert and oriented to person, place, and time.     Cranial Nerves: No cranial nerve deficit.     Sensory: No sensory deficit.     Motor: No weakness.     Gait: Gait normal.  Psychiatric:        Attention and Perception: Attention and perception normal.        Mood and Affect: Mood and affect normal.        Speech: Speech normal.        Behavior: Behavior normal. Behavior is cooperative.        Thought Content: Thought content normal.        Cognition and Memory: Cognition and memory normal.        Judgment: Judgment normal.    Results for orders placed or performed in visit on 04/12/20  POCT Urinalysis Dipstick  Result Value Ref Range   Color, UA dark yellow    Clarity, UA clear    Glucose, UA Negative Negative   Bilirubin, UA negative    Ketones, UA negative    Spec Grav, UA 1.010 1.010 - 1.025   Blood, UA negative    pH, UA 5.0 5.0 - 8.0   Protein, UA Negative Negative   Urobilinogen, UA 0.2 0.2 or 1.0 E.U./dL   Nitrite, UA negative    Leukocytes, UA Trace (A) Negative   Appearance      Odor        Assessment & Plan:   Problem List Items Addressed This Visit      Cardiovascular and Mediastinum   Essential hypertension, benign - Primary    Uncontrolled hypertension.  BP is not at goal <  130/80.  Pt has been working on lifestyle modifications.  Taking medications tolerating well without side effects.   Plan: 1. Continue taking diltiazem 240mg  daily and BEGIN amlodipine 5mg  daily 2. Obtain labs today  3. Encouraged heart healthy diet and increasing exercise to 30 minutes most days of the week, going no more than 2 days in a row without exercise. 4. Check BP 1-2 x per week at home, keep log, and bring to clinic at next appointment. 5. Follow up 2 weeks.         Relevant Medications   amLODipine (NORVASC) 5 MG tablet   Other Relevant Orders   CBC with Differential   COMPLETE METABOLIC PANEL WITH GFR     Other   Hyperlipidemia    Will have labs redrawn today.  Patient has been taking red yeast rice and omega 3 fatty fish oil instead of beginning on a statin.  Plan: 1. Labs to be drawn for evaluation      Relevant Medications   amLODipine (NORVASC) 5 MG tablet   Other Relevant Orders   Lipid Profile   Dizziness    Likely vertigo based on nystagmus present on exam.  Had taken meclizine 25mg  TID PRN in the past with unknown effect on her dizziness.  Plan: 1. Begin meclizine 12.5mg  TID PRN for dizziness 2. Continue to increase fluids and drink an electrolyte replacer daily 3. RTC in 2 weeks for re-evaluation      Relevant Medications   meclizine (ANTIVERT) 12.5 MG tablet   Dysuria    Will send to lab for culture to determine if has urinary tract infection.  Will begin macrobid 100mg  BID x 5 days while awaiting test results.  Plan: 1. Begin Macrobid 100mg  BID x 5 days 2. Continue Azo as needed for dysuria 3. Increase water intake 4. RTC if symptoms worsen or fail to improve with current treatment      Relevant Medications   nitrofurantoin,  macrocrystal-monohydrate, (MACROBID) 100 MG capsule   Other Relevant Orders   POCT Urinalysis Dipstick (Completed)   Urine Culture      Meds ordered this encounter  Medications  . amLODipine (NORVASC) 5 MG tablet    Sig: Take 1 tablet (5 mg total) by mouth daily.    Dispense:  90 tablet    Refill:  3  . meclizine (ANTIVERT) 12.5 MG tablet    Sig: Take 1 tablet (12.5 mg total) by mouth 3 (three) times daily as needed for dizziness.    Dispense:  90 tablet    Refill:  0  . nitrofurantoin, macrocrystal-monohydrate, (MACROBID) 100 MG capsule    Sig: Take 1 capsule (100 mg total) by mouth 2 (two) times daily for 5 days.    Dispense:  10 capsule    Refill:  0   Follow up plan: Return in about 2 weeks (around 04/26/2020) for HTN F/U (Can be virtual).   Harlin Rain, [place] Family Nurse Practitioner [facility] 04/12/2020, 10:26 AM

## 2020-04-12 NOTE — Assessment & Plan Note (Signed)
Likely vertigo based on nystagmus present on exam.  Had taken meclizine 25mg  TID PRN in the past with unknown effect on her dizziness.  Plan: 1. Begin meclizine 12.5mg  TID PRN for dizziness 2. Continue to increase fluids and drink an electrolyte replacer daily 3. RTC in 2 weeks for re-evaluation

## 2020-04-12 NOTE — Assessment & Plan Note (Signed)
Uncontrolled hypertension.  BP is not at goal < 130/80.  Pt has been working on lifestyle modifications.  Taking medications tolerating well without side effects.   Plan: 1. Continue taking diltiazem 240mg  daily and BEGIN amlodipine 5mg  daily 2. Obtain labs today  3. Encouraged heart healthy diet and increasing exercise to 30 minutes most days of the week, going no more than 2 days in a row without exercise. 4. Check BP 1-2 x per week at home, keep log, and bring to clinic at next appointment. 5. Follow up 2 weeks.

## 2020-04-12 NOTE — Assessment & Plan Note (Signed)
Will have labs redrawn today.  Patient has been taking red yeast rice and omega 3 fatty fish oil instead of beginning on a statin.  Plan: 1. Labs to be drawn for evaluation

## 2020-04-12 NOTE — Patient Instructions (Addendum)
Continue all medications as directed.  I have sent in a prescription for amlodipine 5mg  to take 1 tablet daily for your high blood pressure.  Continue to keep a log with your daily blood pressure readings and we can review them at your next appointment.  I have sent in a prescription for meclizine 12.5mg  to take 1 tablet 3x per day as needed for dizziness.  We have sent your urine to the lab for processing and will contact you when we receive the results.  I have sent in a prescription for Macrobid 100mg  to take 1 tablet 2x per day for the next 5 days.  Try to get exercise a minimum of 30 minutes per day at least 5 days per week as well as  adequate water intake all while measuring blood pressure a few times per week.  Keep a blood pressure log and bring back to clinic at your next visit.  If your readings are consistently over 130/80 to contact our office/send me a MyChart message and we will see you sooner.  Can try DASH and Mediterranean diet options, avoiding processed foods, lowering sodium intake, avoiding pork products, and eating a plant based diet for optimal health.  We will plan to see you back in 2 weeks for hypertension and dizziness follow up visit  You will receive a survey after today's visit either digitally by e-mail or paper by [place] mail. Your experiences and feedback matter to [place].  Please respond so we know how we are doing as we provide care for you.  Call us with any questions/concerns/needs.  It is my goal to be available to you for your health concerns.  Thanks for choosing me to be a partner in your healthcare needs!  Harlin Rain, FNP-C Family Nurse Practitioner [facility] Phone: [phone]

## 2020-04-13 LAB — URINE CULTURE
MICRO NUMBER:: 10888908
Result:: NO GROWTH
SPECIMEN QUALITY:: ADEQUATE

## 2020-04-21 ENCOUNTER — Ambulatory Visit: Payer: PPO | Admitting: Pharmacist

## 2020-04-21 ENCOUNTER — Telehealth: Payer: PPO

## 2020-04-21 DIAGNOSIS — I10 Essential (primary) hypertension: Secondary | ICD-10-CM

## 2020-04-21 NOTE — Patient Instructions (Signed)
Thank you allowing the Chronic Care Management Team to be a part of your care! It was a pleasure speaking with you today!     CCM (Chronic Care Management) Team    Noreene Larsson RN, MSN, CCM Nurse Care Coordinator  [phone]   Harlow Asa PharmD  Clinical Pharmacist  [phone]   [name] LCSW Clinical Social Worker [phone]  Visit Information  Goals Addressed            This Visit's Progress   . PharmD - Medication Review       CARE PLAN ENTRY (see longitudinal plan of care for additional care plan information)   Current Barriers:  . 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 Inter-disciplinary care team collaboration (see longitudinal plan of care) . Perform chart review. Note patient seen for office visit with PCP on 8/30.  o PCP advised patient to: - Continue diltiazem 240mg  daily and BEGIN amlodipine 5mg  daily for improved HTN control - Begin meclizine 12.5mg  TID PRN for dizziness - Begin Macrobid 100mg  BID x 5 days for possible UTI - Increase water intake for urinary health and reduce risk of dizziness o Follow up with PCP scheduled for 9/13 for HTN . Follow up with caregiver today regarding medication management and adherence o Note spouse manages patient's medications and fills weekly pillbox o Counsel on using weekly pillbox for adherence, including using medication list from latest after visit summary to fill pillbox o Remind caregiver to keep as needed medications out of pillbox so can be given to patient as needed as directed o Confirms patient completed course of Macrobid as directed . Discuss importance of BP control and  monitoring o Reports patient taking:  - diltiazem CD 240 mg once daily - Amlodipine 5 mg daily o Reports checking home BP as directed and keeping log. Does not have results in from of him today, but confirms will bring record to upcoming appointment with PCP o Encourage patient and caregiver to continue to check BP with patient 1-2 x per week at home, keep log, and bring to medical appointments . Encourage caregiver to help patient with staying hydrated  Patient Self Care Activities:  . UNABLE to self-administer medications as prescribed. Spouse fills weekly pillbox for patient . Attends all scheduled provider appointments with help of spouse  Please see past updates related to this goal by clicking on the "Past Updates" button in the selected goal         Patient verbalizes understanding of instructions provided today.   Telephone follow up appointment with care management team member scheduled for: 10/13 at 1:45 pm  Harlow Asa, PharmD, [place] [phone]

## 2020-04-21 NOTE — Chronic Care Management (AMB) (Signed)
Chronic Care Management   Follow Up Note   04/21/2020 Name: Kristin [name] MRN: [id number] DOB: 11/25/1940  Referred by: Verl Bangs, FNP Reason for referral : No chief complaint on file.   Kristin [name] is a 79 y.o. year old female who is a primary care patient of [name], [name], FNP. The CCM team was consulted for assistance with chronic disease management and care coordination needs.    I reached out to patient's spouse, Kristin [name], by phone today. Note Mr. Ostermann is listed on patient's DPR in chart.  Review of patient status, including review of consultants reports, relevant laboratory and other test results, and collaboration with appropriate care team members and the patient's provider was performed as part of comprehensive patient evaluation and provision of chronic care management services.    SDOH (Social Determinants of Health) assessments performed: No See Care Plan activities for detailed interventions related to [facility])     Outpatient Encounter Medications as of 04/21/2020  Medication Sig  . amLODipine (NORVASC) 5 MG tablet Take 1 tablet (5 mg total) by mouth daily.  [name] diltiazem (CARDIZEM CD) 240 MG 24 hr capsule Take 1 capsule (240 mg total) by mouth daily.  [name] acetaminophen (TYLENOL) 325 MG tablet Take 325 mg by mouth every 8 (eight) hours as needed (pain).   . ASPIRIN 81 PO Take 1 tablet by mouth daily.  . betamethasone dipropionate (DIPROLENE) 0.05 % ointment APPLY TO AFFECTED AREAS TWICE DAILY AS NEEDED  . Calcium Carbonate-Vitamin D (CALTRATE 600+D PO) Take 1 tablet by mouth daily.   . Cranberry 400 MG TABS Take 1 tablet by mouth daily.  . Lactobacillus Rhamnosus, GG, (CULTURELLE PO) Take 1 capsule by mouth daily.  . meclizine (ANTIVERT) 12.5 MG tablet Take 1 tablet (12.5 mg total) by mouth 3 (three) times daily as needed for dizziness.  . memantine (NAMENDA) 10 MG tablet TAKE 1 TABLET BY MOUTH TWICE DAILY  . Multiple Vitamin (MULTIVITAMIN) tablet Take 1 tablet  by mouth daily.    . nitroGLYCERIN (NITROSTAT) 0.4 MG SL tablet Place 1 tablet (0.4 mg total) under the tongue every 5 (five) minutes as needed for chest pain.  . Omega-3 Fatty Acids (FISH OIL) 1200 MG CAPS Take 1 capsule by mouth daily.   Vladimir Faster Glycol-Propyl Glycol (SYSTANE) 0.4-0.3 % SOLN Apply 1 drop to eye daily as needed (dry eyes).   . Red Yeast Rice 600 MG CAPS Take 2 capsules by mouth daily.  . traMADol (ULTRAM) 50 MG tablet TAKE ONE TABLET (50 MG) BY MOUTH EVERY 12 HOURS AS NEEDED FOR MODERATE PAIN   No facility-administered encounter medications on file as of 04/21/2020.    Goals Addressed            This Visit's Progress   . PharmD - Medication Review       CARE PLAN ENTRY (see longitudinal plan of care for additional care plan information)   Current Barriers:  . 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 Inter-disciplinary care team collaboration (see longitudinal plan of care) . Perform chart review. Note patient seen for office visit with PCP on 8/30.  o PCP advised patient to: - Continue diltiazem 240mg  daily and BEGIN  amlodipine 5mg  daily for improved HTN control - Begin meclizine 12.5mg  TID PRN for dizziness - Begin Macrobid 100mg  BID x 5 days for possible UTI - Increase water intake for urinary health and reduce risk of dizziness o Follow up with PCP scheduled for 9/13 for HTN . Follow up with caregiver today regarding medication management and adherence o Note spouse manages patient's medications and fills weekly pillbox o Counsel on using weekly pillbox for adherence, including using medication list from latest after visit summary to fill  pillbox o Remind caregiver to keep as needed medications out of pillbox so can be given to patient as needed as directed o Confirms patient completed course of Macrobid as directed . Discuss importance of BP control and monitoring o Reports patient taking:  - diltiazem CD 240 mg once daily - Amlodipine 5 mg daily o Reports checking home BP as directed and keeping log. Does not have results in from of him today, but confirms will bring record to upcoming appointment with PCP o Encourage patient and caregiver to continue to check BP with patient 1-2 x per week at home, keep log, and bring to medical appointments . Encourage caregiver to help patient with staying hydrated  Patient Self Care Activities:  . UNABLE to self-administer medications as prescribed. Spouse fills weekly pillbox for patient . Attends all scheduled provider appointments with help of spouse  Please see past updates related to this goal by clicking on the "Past Updates" button in the selected goal         Plan  Telephone follow up appointment with care management team member scheduled for: 10/13 at 1:45 pm  Harlow Asa, PharmD, [place] [phone]

## 2020-04-22 ENCOUNTER — Ambulatory Visit
Admission: RE | Admit: 2020-04-22 | Discharge: 2020-04-22 | Disposition: A | Discharge status: Home / Self Care | Payer: PPO | Source: Ambulatory Visit | Attending: Family Medicine | Admitting: Family Medicine

## 2020-04-22 DIAGNOSIS — Z1231 Encounter for screening mammogram for malignant neoplasm of breast: Secondary | ICD-10-CM | POA: Insufficient documentation

## 2020-04-23 ENCOUNTER — Telehealth: Payer: PPO

## 2020-04-26 ENCOUNTER — Encounter: Payer: Self-pay | Admitting: Family Medicine

## 2020-04-26 ENCOUNTER — Telehealth (INDEPENDENT_AMBULATORY_CARE_PROVIDER_SITE_OTHER): Payer: PPO | Admitting: Family Medicine

## 2020-04-26 ENCOUNTER — Other Ambulatory Visit: Payer: Self-pay

## 2020-04-26 DIAGNOSIS — I10 Essential (primary) hypertension: Secondary | ICD-10-CM | POA: Diagnosis not present

## 2020-04-26 MED ORDER — HYDROCHLOROTHIAZIDE 12.5 MG PO TABS: 12.5000 mg | ORAL_TABLET | Freq: Every day | ORAL | 1 refills | Status: DC

## 2020-04-26 NOTE — Patient Instructions (Signed)
As we discussed, STOP your amlodipine and BEGIN hydrochlorothiazide 12.5mg .  Try to get exercise a minimum of 30 minutes per day at least 5 days per week as well as  adequate water intake all while measuring blood pressure a few times per week.  Keep a blood pressure log and bring back to clinic at your next visit.  If your readings are consistently over 130/80 to contact our office/send me a MyChart message and we will see you sooner.  Can try DASH and Mediterranean diet options, avoiding processed foods, lowering sodium intake, avoiding pork products, and eating a plant based diet for optimal health.  We will plan to see you back in 2 weeks for hypertension follow up visit  You will receive a survey after today's visit either digitally by e-mail or paper by [place] mail. Your experiences and feedback matter to [place].  Please respond so we know how we are doing as we provide care for you.  Call us with any questions/concerns/needs.  It is my goal to be available to you for your health concerns.  Thanks for choosing me to be a partner in your healthcare needs!  Harlin Rain, FNP-C Family Nurse Practitioner [facility] Phone: [phone]

## 2020-04-26 NOTE — Progress Notes (Signed)
Virtual Visit via Telephone  The purpose of this virtual visit is to provide medical care while limiting exposure to the novel coronavirus (COVID19) for both patient and office staff.  Consent was obtained for phone visit:  Yes.   Answered questions that patient had about telehealth interaction:  Yes.   I discussed the limitations, risks, security and privacy concerns of performing an evaluation and management service by telephone. I also discussed with the patient that there may be a patient responsible charge related to this service. The patient expressed understanding and agreed to proceed.  Patient is at home and is accessed via telephone Services are provided by Harlin Rain, FNP-C from [facility])  ---------------------------------------------------------------------- Chief Complaint  Patient presents with  . Hypertension    S: Reviewed CMA documentation. I have called patient and gathered additional HPI as follows:  Kristin [name] presents for virtual telemedicine with Mr. Vanosdol to review her blood pressure since beginning amlodipine 5mg  daily.  Reports in the last two weeks she has had some lower leg swelling, has her last reading for her blood pressure at 140/68.  Has not had leg swelling before starting on the amlodipine.  Denies headaches, dizziness, visual changes, lightheadedness, chest pain, SOB, or palpitations.  Patient is currently home Denies any high risk travel to areas of current concern for COVID19. Denies any known or suspected exposure to person with or possibly with COVID19.  Past Medical History:  Diagnosis Date  . Allergy   . Arthritis    recent falls -aug 2010  . [name] (coronary artery disease)    mi-1996  . Complication of anesthesia    pt states takes a long time to wake her wake up  . 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 Cough    no fever   .  Degeneration of lumbar or lumbosacral intervertebral disc   . Dizziness    r/t meds  . DVT (deep venous thrombosis) ([place])   . Fibromyalgia   . History of shingles   . [name] (irritable bowel syndrome)    takes OTC [place] Negus colon health  . Joint pain   . Joint swelling   . Myalgia and myositis, unspecified   . Obstructive sleep apnea (adult) (pediatric)   . Old myocardial infarction 1996  . Osteoarthritis   . Osteoarthrosis, unspecified whether generalized or localized, unspecified site   . Psoriatic arthritis ([place])   . Rheumatoid arthritis ([place])   . Rosacea   . Seasonal allergies   . Stroke [facility])    ischaemic microvascular disease  . 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 Unspecified essential hypertension    takes Diltiazem,Metoprolol,and Losartan daily  . UTI (lower urinary tract infection)    frequent=last one was winter of 2014   Social History   Tobacco Use  . Smoking status: Never Smoker  . Smokeless tobacco: Never Used  Vaping Use  . Vaping Use: Never used  Substance Use Topics  . Alcohol use: No    Alcohol/week: 0.0 standard drinks    Comment: Rare  . Drug use: No    Current Outpatient Medications:  .  acetaminophen (TYLENOL) 325 MG tablet, Take 325 mg by mouth every 8 (eight) hours as needed (pain). , Disp: , Rfl:  .  amLODipine (NORVASC) 5 MG tablet, Take 1 tablet (5 mg total) by mouth daily., Disp: 90  tablet, Rfl: 3 .  ASPIRIN 81 PO, Take 1 tablet by mouth daily., Disp: , Rfl:  .  betamethasone dipropionate (DIPROLENE) 0.05 % ointment, APPLY TO AFFECTED AREAS TWICE DAILY AS NEEDED, Disp: , Rfl:  .  Calcium Carbonate-Vitamin D (CALTRATE 600+D PO), Take 1 tablet by mouth daily. , Disp: , Rfl:  .  Cranberry 400 MG TABS, Take 1 tablet by mouth daily., Disp: , Rfl:  .  diltiazem (CARDIZEM CD) 240 MG 24 hr capsule, Take 1 capsule (240 mg total) by mouth daily., Disp: 90 capsule, Rfl: 3 .  Lactobacillus Rhamnosus,  GG, (CULTURELLE PO), Take 1 capsule by mouth daily., Disp: , Rfl:  .  meclizine (ANTIVERT) 12.5 MG tablet, Take 1 tablet (12.5 mg total) by mouth 3 (three) times daily as needed for dizziness., Disp: 90 tablet, Rfl: 0 .  memantine (NAMENDA) 10 MG tablet, TAKE 1 TABLET BY MOUTH TWICE DAILY, Disp: 180 tablet, Rfl: 0 .  Multiple Vitamin (MULTIVITAMIN) tablet, Take 1 tablet by mouth daily.  , Disp: , Rfl:  .  nitroGLYCERIN (NITROSTAT) 0.4 MG SL tablet, Place 1 tablet (0.4 mg total) under the tongue every 5 (five) minutes as needed for chest pain., Disp: 25 tablet, Rfl: 3 .  Omega-3 Fatty Acids (FISH OIL) 1200 MG CAPS, Take 1 capsule by mouth daily. , Disp: , Rfl:  .  Polyethyl Glycol-Propyl Glycol (SYSTANE) 0.4-0.3 % SOLN, Apply 1 drop to eye daily as needed (dry eyes). , Disp: , Rfl:  .  Red Yeast Rice 600 MG CAPS, Take 2 capsules by mouth daily., Disp: , Rfl:  .  traMADol (ULTRAM) 50 MG tablet, TAKE ONE TABLET (50 MG) BY MOUTH EVERY 12 HOURS AS NEEDED FOR MODERATE PAIN, Disp: 45 tablet, Rfl: 0 .  hydrochlorothiazide (HYDRODIURIL) 12.5 MG tablet, Take 1 tablet (12.5 mg total) by mouth daily., Disp: 90 tablet, Rfl: 1  Depression screen [facility] 2/9 12/18/2019 11/04/2019 03/04/2019  Decreased Interest 0 0 0  Down, Depressed, Hopeless 0 0 0  PHQ - 2 Score 0 0 0  Altered sleeping - - -  Tired, decreased energy - - -  Change in appetite - - -  Feeling bad or failure about yourself  - - -  Trouble concentrating - - -  Moving slowly or fidgety/restless - - -  Suicidal thoughts - - -  PHQ-9 Score - - -  Difficult doing work/chores - - -  Some recent data might be hidden    No flowsheet data found.  -------------------------------------------------------------------------- O: No physical exam performed due to remote telephone encounter.  Physical Exam: Patient remotely monitored with video.  Verbal communication appropriate.  Cognition normal.  Recent Results (from the past 2160 hour(s))  POCT  Urinalysis Dipstick     Status: Abnormal   Collection Time: 04/12/20  9:06 AM  Result Value Ref Range   Color, UA dark yellow    Clarity, UA clear    Glucose, UA Negative Negative   Bilirubin, UA negative    Ketones, UA negative     Comment: `   Spec Grav, UA 1.010 1.010 - 1.025   Blood, UA negative    pH, UA 5.0 5.0 - 8.0   Protein, UA Negative Negative   Urobilinogen, UA 0.2 0.2 or 1.0 E.U./dL   Nitrite, UA negative    Leukocytes, UA Trace (A) Negative   Appearance     Odor    Urine Culture     Status: None   Collection Time: 04/12/20 10:04  AM   Specimen: Urine  Result Value Ref Range   MICRO NUMBER: [id number]    SPECIMEN QUALITY: Adequate    Sample Source URINE    STATUS: FINAL    Result: No Growth     -------------------------------------------------------------------------- A&P:  Problem List Items Addressed This Visit      Cardiovascular and Mediastinum   Essential hypertension, benign - Primary    Uncontrolled hypertension.  BP is not at goal < 130/80.  Pt is working on lifestyle modifications.  Taking medications tolerating well without side effects. Has had some increased swelling to ankles, will stop amlodipine 5mg  and switch to HCTZ 12.5mg .  Plan: 1. STOP amlodipine 5mg  daily and BEGIN HCTZ 12.5mg  daily 2. Encouraged heart healthy diet and increasing exercise to 30 minutes most days of the week, going no more than 2 days in a row without exercise. 3. Check BP 1-2 x per day at home, keep log, and bring to clinic at next appointment. 4. Follow up 2 weeks.         Relevant Medications   hydrochlorothiazide (HYDRODIURIL) 12.5 MG tablet      Meds ordered this encounter  Medications  . hydrochlorothiazide (HYDRODIURIL) 12.5 MG tablet    Sig: Take 1 tablet (12.5 mg total) by mouth daily.    Dispense:  90 tablet    Refill:  1    Follow-up: - Return in 2 weeks for hypertension re-evaluatiom  Patient verbalizes understanding with the above medical  recommendations including the limitation of remote medical advice.  Specific follow-up and call-back criteria were given for patient to follow-up or seek medical care more urgently if needed.  - Time spent in direct consultation with patient on phone: 8 minutes  Harlin Rain, [facility] 04/26/2020, 4:52 PM

## 2020-04-26 NOTE — Assessment & Plan Note (Signed)
Uncontrolled hypertension.  BP is not at goal < 130/80.  Pt is working on lifestyle modifications.  Taking medications tolerating well without side effects. Has had some increased swelling to ankles, will stop amlodipine 5mg  and switch to HCTZ 12.5mg .  Plan: 1. STOP amlodipine 5mg  daily and BEGIN HCTZ 12.5mg  daily 2. Encouraged heart healthy diet and increasing exercise to 30 minutes most days of the week, going no more than 2 days in a row without exercise. 3. Check BP 1-2 x per day at home, keep log, and bring to clinic at next appointment. 4. Follow up 2 weeks.

## 2020-05-10 ENCOUNTER — Other Ambulatory Visit: Payer: Self-pay | Admitting: Family Medicine

## 2020-05-10 DIAGNOSIS — R42 Dizziness and giddiness: Secondary | ICD-10-CM

## 2020-05-10 NOTE — Telephone Encounter (Signed)
Requested medication (s) are due for refill today: Yes  Requested medication (s) are on the active medication list: Yes  Last refill:  04/12/20  Future visit scheduled: No  Notes to clinic:  See request.    Requested Prescriptions  Pending Prescriptions Disp Refills   meclizine (ANTIVERT) 12.5 MG tablet [Pharmacy Med Name: MECLIZINE HCL 12.5 MG TAB] 90 tablet 0    Sig: TAKE ONE TABLET BY MOUTH 3 TIMES DAILY AS NEEDED FOR DIZZINESS      Not Delegated - Gastroenterology: Antiemetics Failed - 05/10/2020  1:44 PM      Failed - This refill cannot be delegated      Passed - Valid encounter within last 6 months    Recent Outpatient Visits           2 weeks ago Essential hypertension, benign   [facility], [name], FNP   4 weeks ago Essential hypertension, benign   [facility], [name], FNP   1 month ago Essential hypertension, benign   [facility], [name], FNP   6 months ago Short-term memory loss   [facility], [name], FNP   7 months ago Essential hypertension, benign   [facility], [name], [place]

## 2020-05-26 ENCOUNTER — Telehealth: Payer: PPO

## 2020-05-26 ENCOUNTER — Telehealth: Payer: Self-pay | Admitting: Pharmacist

## 2020-05-26 NOTE — Chronic Care Management (AMB) (Signed)
°  Chronic Care Management   Outreach Note  05/26/2020 Name: LILLIAS DIFRANCESCO MRN: [id number] DOB: June 18, 1941  Referred by: Verl Bangs, FNP Reason for referral : No chief complaint on file.   Was unable to reach patient/spouse via telephone today and have left HIPAA compliant voicemail asking patient to return my call.    Follow Up Plan: Will collaborate with Care Guide to outreach to schedule follow up with me  Harlow Asa, PharmD, [place] [facility] [phone]

## 2020-06-01 ENCOUNTER — Ambulatory Visit: Payer: PPO

## 2020-06-01 ENCOUNTER — Ambulatory Visit (INDEPENDENT_AMBULATORY_CARE_PROVIDER_SITE_OTHER): Payer: PPO

## 2020-06-01 ENCOUNTER — Other Ambulatory Visit: Payer: Self-pay

## 2020-06-01 DIAGNOSIS — Z23 Encounter for immunization: Secondary | ICD-10-CM

## 2020-06-02 ENCOUNTER — Telehealth: Payer: Self-pay

## 2020-06-02 NOTE — Chronic Care Management (AMB) (Signed)
  Care Management   Note  06/02/2020 Name: JADAN ROUILLARD MRN: [id number] DOB: Dec 17, 1940  ALLEXUS OVENS is a 79 y.o. year old female who is a primary care patient of [name], [name], [place] and is actively engaged with the care management team. I reached out to [name] by phone today to assist with re-scheduling a follow up visit with the Pharmacist  Follow up plan: Unsuccessful telephone outreach attempt made. A HIPAA compliant phone message was left for the patient providing contact information and requesting a return call.  The care management team will reach out to the patient again over the next 7 days.  If patient returns call to provider office, please advise to call [facility]  at [place], [place], [place], [place] [address] Direct Dial: [phone] [email or web address] Website: [email or web address]

## 2020-06-03 ENCOUNTER — Telehealth: Payer: PPO | Admitting: General Practice

## 2020-06-03 ENCOUNTER — Telehealth: Payer: PPO

## 2020-06-03 ENCOUNTER — Ambulatory Visit (INDEPENDENT_AMBULATORY_CARE_PROVIDER_SITE_OTHER): Payer: PPO | Admitting: General Practice

## 2020-06-03 DIAGNOSIS — E782 Mixed hyperlipidemia: Secondary | ICD-10-CM

## 2020-06-03 DIAGNOSIS — I10 Essential (primary) hypertension: Secondary | ICD-10-CM

## 2020-06-03 DIAGNOSIS — R413 Other amnesia: Secondary | ICD-10-CM

## 2020-06-03 NOTE — Patient Instructions (Signed)
Visit Information  Goals Addressed              This Visit's Progress   .  RNCM:"I don't check my blood pressure a lot" (pt-stated)        CARE PLAN ENTRY (see longtitudinal plan of care for additional care plan information)  Current Barriers:  . Chronic Disease Management support, education, and care coordination needs related to HTN and HLD  Clinical Goal(s) related to HTN and HLD:  Over the next 120 days, patient will:  . Work with the care management team to address educational, disease management, and care coordination needs  . Begin or continue self health monitoring activities as directed today Measure and record blood pressure 2 times per week and adhere to a heart healthy diet . Call provider office for new or worsened signs and symptoms Blood pressure findings outside established parameters and New or worsened symptom related to HTN/HLD and other chronic conditions . Call care management team with questions or concerns . Verbalize basic understanding of patient centered plan of care established today  Interventions related to HTN and HLD:  . Evaluation of current treatment plans and patient's adherence to plan as established by provider . Assessed patient understanding of disease states.  Per the husband he and the patient understand her conditions well. They check her blood pressure at home and it is good. 06-03-2020: The patients husband gave blood pressure readings for last several days:  05-23-2020: 129/64 and 134/65, 05-26-2020 130/67, 06-02-2020 139/62.  The patient has switched from Amlodipine to HCTZ 12.5mg .  The patient states she is still having swelling in her ankles. Is going to start using ted hose.  Will collaborate with the pcp concerning continued swelling in ankles. Have sent an in basket message for the front office to call and get a follow up appointment for the patient.  . Assessed patient's education and care coordination needs.  Per the husband there have  been no new changes in the patients condition. Ask if they had talked with the cardiologist about adding a statin to her medication therapy and the husband states they have not. Will continue to follow up in subsequent calls.  . Provided disease specific education to patient.  06-03-2020: The patient is elevating legs when sitting and is going to use ted hose to help with the edema. The patient states that her ankles have been more swollen lately.  Nash Dimmer with appropriate clinical care team members regarding patient needs. . Evaluation of upcoming appointments.  The patient has no upcoming appointments to see the pcp. Will have front office call and schedule an appointment.   Patient Self Care Activities related to HTN and HLD:  . Patient is unable to independently self-manage chronic health conditions  Please see past updates related to this goal by clicking on the "Past Updates" button in the selected goal         Patient verbalizes understanding of instructions provided today.   Telephone follow up appointment with care management team member scheduled for: 07-29-2020 at 1:45 pm  [employer], MSN, [place] Mobile: [phone]

## 2020-06-03 NOTE — Chronic Care Management (AMB) (Signed)
Chronic Care Management   Follow Up Note   06/03/2020 Name: Kristin [name] MRN: [id number] DOB: 12-19-1940  Referred by: Verl Bangs, FNP Reason for referral : Chronic Care Management (RNCM Chronic Disease Management and Care Coordination Needs )   Kristin [name] is a 79 y.o. year old female who is a primary care patient of Verl Bangs, FNP. The CCM team was consulted for assistance with chronic disease management and care coordination needs.    Review of patient status, including review of consultants reports, relevant laboratory and other test results, and collaboration with appropriate care team members and the patient's provider was performed as part of comprehensive patient evaluation and provision of chronic care management services.    SDOH (Social Determinants of Health) assessments performed: Yes See Care Plan activities for detailed interventions related to [facility])     Outpatient Encounter Medications as of 06/03/2020  Medication Sig  . acetaminophen (TYLENOL) 325 MG tablet Take 325 mg by mouth every 8 (eight) hours as needed (pain).   [name] amLODipine (NORVASC) 5 MG tablet Take 1 tablet (5 mg total) by mouth daily.  . ASPIRIN 81 PO Take 1 tablet by mouth daily.  . betamethasone dipropionate (DIPROLENE) 0.05 % ointment APPLY TO AFFECTED AREAS TWICE DAILY AS NEEDED  . Calcium Carbonate-Vitamin D (CALTRATE 600+D PO) Take 1 tablet by mouth daily.   . Cranberry 400 MG TABS Take 1 tablet by mouth daily.  [name] diltiazem (CARDIZEM CD) 240 MG 24 hr capsule Take 1 capsule (240 mg total) by mouth daily.  . hydrochlorothiazide (HYDRODIURIL) 12.5 MG tablet Take 1 tablet (12.5 mg total) by mouth daily.  . Lactobacillus Rhamnosus, GG, (CULTURELLE PO) Take 1 capsule by mouth daily.  . meclizine (ANTIVERT) 12.5 MG tablet TAKE ONE TABLET BY MOUTH 3 TIMES DAILY AS NEEDED FOR DIZZINESS  . memantine (NAMENDA) 10 MG tablet TAKE 1 TABLET BY MOUTH TWICE DAILY  . Multiple Vitamin (MULTIVITAMIN)  tablet Take 1 tablet by mouth daily.    . nitroGLYCERIN (NITROSTAT) 0.4 MG SL tablet Place 1 tablet (0.4 mg total) under the tongue every 5 (five) minutes as needed for chest pain.  . Omega-3 Fatty Acids (FISH OIL) 1200 MG CAPS Take 1 capsule by mouth daily.   Vladimir Faster Glycol-Propyl Glycol (SYSTANE) 0.4-0.3 % SOLN Apply 1 drop to eye daily as needed (dry eyes).   . Red Yeast Rice 600 MG CAPS Take 2 capsules by mouth daily.  . traMADol (ULTRAM) 50 MG tablet TAKE ONE TABLET (50 MG) BY MOUTH EVERY 12 HOURS AS NEEDED FOR MODERATE PAIN   No facility-administered encounter medications on file as of 06/03/2020.     Objective:  BP Readings from Last 3 Encounters:  06/02/20 139/62  04/12/20 (!) 142/55  03/15/20 (!) 131/47    Goals Addressed              This Visit's Progress   .  RNCM:"I don't check my blood pressure a lot" (pt-stated)        CARE PLAN ENTRY (see longtitudinal plan of care for additional care plan information)  Current Barriers:  . Chronic Disease Management support, education, and care coordination needs related to HTN and HLD  Clinical Goal(s) related to HTN and HLD:  Over the next 120 days, patient will:  . Work with the care management team to address educational, disease management, and care coordination needs  . Begin or continue self health monitoring activities as directed today Measure and record blood pressure  2 times per week and adhere to a heart healthy diet . Call provider office for new or worsened signs and symptoms Blood pressure findings outside established parameters and New or worsened symptom related to HTN/HLD and other chronic conditions . Call care management team with questions or concerns . Verbalize basic understanding of patient centered plan of care established today  Interventions related to HTN and HLD:  . Evaluation of current treatment plans and patient's adherence to plan as established by provider . Assessed patient understanding  of disease states.  Per the husband he and the patient understand her conditions well. They check her blood pressure at home and it is good. 06-03-2020: The patients husband gave blood pressure readings for last several days:  05-23-2020: 129/64 and 134/65, 05-26-2020 130/67, 06-02-2020 139/62.  The patient has switched from Amlodipine to HCTZ 12.5mg .  The patient states she is still having swelling in her ankles. Is going to start using ted hose.  Will collaborate with the pcp concerning continued swelling in ankles. Have sent an in basket message for the front office to call and get a follow up appointment for the patient.  . Assessed patient's education and care coordination needs.  Per the husband there have been no new changes in the patients condition. Ask if they had talked with the cardiologist about adding a statin to her medication therapy and the husband states they have not. Will continue to follow up in subsequent calls.  . Provided disease specific education to patient.  06-03-2020: The patient is elevating legs when sitting and is going to use ted hose to help with the edema. The patient states that her ankles have been more swollen lately.  Nash Dimmer with appropriate clinical care team members regarding patient needs. . Evaluation of upcoming appointments.  The patient has no upcoming appointments to see the pcp. Will have front office call and schedule an appointment.   Patient Self Care Activities related to HTN and HLD:  . Patient is unable to independently self-manage chronic health conditions  Please see past updates related to this goal by clicking on the "Past Updates" button in the selected goal          Plan:   Telephone follow up appointment with care management team member scheduled for: 07-29-2020 at 1:45 pm   Noreene Larsson RN, MSN, [place] Mobile: [phone]

## 2020-06-07 ENCOUNTER — Other Ambulatory Visit: Payer: Self-pay | Admitting: Family Medicine

## 2020-06-07 DIAGNOSIS — R42 Dizziness and giddiness: Secondary | ICD-10-CM

## 2020-06-07 NOTE — Telephone Encounter (Signed)
Requested medication (s) are due for refill today: yes  Requested medication (s) are on the active medication list: yes  Last refill:  05/10/20 #90 0 refills   Future visit scheduled: no  Notes to clinic:  not delegated per protocol     Requested Prescriptions  Pending Prescriptions Disp Refills   meclizine (ANTIVERT) 12.5 MG tablet [Pharmacy Med Name: MECLIZINE HCL 12.5 MG TAB] 90 tablet 0    Sig: TAKE 1 TABLET BY MOUTH THREE TIMES [place]      Not Delegated - Gastroenterology: Antiemetics Failed - 06/07/2020 12:47 PM      Failed - This refill cannot be delegated      Passed - Valid encounter within last 6 months    Recent Outpatient Visits           1 month ago Essential hypertension, benign   [facility], [name], FNP   1 month ago Essential hypertension, benign   [facility], [name], FNP   2 months ago Essential hypertension, benign   [facility], [name], FNP   7 months ago Short-term memory loss   [place], FNP   8 months ago Essential hypertension, benign   [facility], [name], [place]

## 2020-06-17 NOTE — Chronic Care Management (AMB) (Signed)
  Care Management   Note  06/17/2020 Name: [name] MRN: [id number] DOB: 1941/07/26  Kristin [name] is a 79 y.o. year old female who is a primary care patient of [name], [name], [place] and is actively engaged with the care management team. I reached out to [name] by phone today to assist with re-scheduling a follow up visit with the Pharmacist  Follow up plan: Telephone appointment with care management team member scheduled for:06/23/2020  Noreene Larsson, [place], [place], [place] [address] Direct Dial: [phone] [email or web address] Website: [email or web address]

## 2020-06-17 NOTE — Telephone Encounter (Signed)
Pt has been r/s  

## 2020-06-23 ENCOUNTER — Ambulatory Visit: Payer: PPO | Admitting: Pharmacist

## 2020-06-23 DIAGNOSIS — I10 Essential (primary) hypertension: Secondary | ICD-10-CM

## 2020-06-23 DIAGNOSIS — R42 Dizziness and giddiness: Secondary | ICD-10-CM

## 2020-06-23 NOTE — Chronic Care Management (AMB) (Signed)
Chronic Care Management   Follow Up Note   06/23/2020 Name: Kristin [name] MRN: [id number] DOB: 1941-04-14  Referred by: Verl Bangs, FNP Reason for referral : Chronic Care Management (Patient Phone Call)   Kristin [name] is a 79 y.o. year old female who is a primary care patient of Verl Bangs, FNP. The CCM team was consulted for assistance with chronic disease management and care coordination needs.    I reached out to [name] and her husband/caregiver by phone today.   Review of patient status, including review of consultants reports, relevant laboratory and other test results, and collaboration with appropriate care team members and the patient's provider was performed as part of comprehensive patient evaluation and provision of chronic care management services.    SDOH (Social Determinants of Health) assessments performed: No See Care Plan activities for detailed interventions related to [facility])     Outpatient Encounter Medications as of 06/23/2020  Medication Sig  . diltiazem (CARDIZEM CD) 240 MG 24 hr capsule Take 1 capsule (240 mg total) by mouth daily.  . hydrochlorothiazide (HYDRODIURIL) 12.5 MG tablet Take 1 tablet (12.5 mg total) by mouth daily.  [name] acetaminophen (TYLENOL) 325 MG tablet Take 325 mg by mouth every 8 (eight) hours as needed (pain).   . ASPIRIN 81 PO Take 1 tablet by mouth daily.  . betamethasone dipropionate (DIPROLENE) 0.05 % ointment APPLY TO AFFECTED AREAS TWICE DAILY AS NEEDED  . Calcium Carbonate-Vitamin D (CALTRATE 600+D PO) Take 1 tablet by mouth daily.   . Cranberry 400 MG TABS Take 1 tablet by mouth daily.  . Lactobacillus Rhamnosus, GG, (CULTURELLE PO) Take 1 capsule by mouth daily.  . meclizine (ANTIVERT) 12.5 MG tablet Take 1 tablet (12.5 mg total) by mouth 3 (three) times daily as needed for dizziness.  . memantine (NAMENDA) 10 MG tablet TAKE 1 TABLET BY MOUTH TWICE DAILY  . Multiple Vitamin (MULTIVITAMIN) tablet Take 1 tablet  by mouth daily.    . nitroGLYCERIN (NITROSTAT) 0.4 MG SL tablet Place 1 tablet (0.4 mg total) under the tongue every 5 (five) minutes as needed for chest pain.  . Omega-3 Fatty Acids (FISH OIL) 1200 MG CAPS Take 1 capsule by mouth daily.   Vladimir Faster Glycol-Propyl Glycol (SYSTANE) 0.4-0.3 % SOLN Apply 1 drop to eye daily as needed (dry eyes).   . Red Yeast Rice 600 MG CAPS Take 2 capsules by mouth daily.  . traMADol (ULTRAM) 50 MG tablet TAKE ONE TABLET (50 MG) BY MOUTH EVERY 12 HOURS AS NEEDED FOR MODERATE PAIN  . [DISCONTINUED] amLODipine (NORVASC) 5 MG tablet Take 1 tablet (5 mg total) by mouth daily. (Patient not taking: Reported on 06/23/2020)   No facility-administered encounter medications on file as of 06/23/2020.    Goals Addressed            This Visit's Progress   . PharmD - Medication Review       CARE PLAN ENTRY (see longitudinal plan of care for additional care plan information)   Current Barriers:  . 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 Inter-disciplinary care team collaboration (see longitudinal plan of care) . Perform chart review.  o Patient completed telemedicine visit  with PCP on 9/13 for review of blood pressure. Patient advised to: - STOP amlodipine 5mg  daily and BEGIN HCTZ 12.5mg  daily - Check BP 1-2 x per day at home, keep log, and bring to clinic at next appointment. - Follow up 2 weeks.  o Note labs (CMP, lipid profile and CBC) ordered on 8/30 not completed . Follow up with patient and spouse regarding BP control and monitoring o Reports patient taking:  - diltiazem CD 240 mg once daily - HCTZ 12.5 mg once daily - Confirm stopped  amlodipine as directed o Reports checking home BP and keeping log, but not checking daily as directed - Reports obtained new upper arm BP monitor today - Reports recent BP readings: . 11/10: 140/64, HR 71 . 11/7: 124/58, HR 71 . 10/20: 139/62, HR 69 o Encourage patient and caregiver to check BP daily at home, keep log, and bring to medical appointments o Report patient continues to have swelling in her ankles, but unable to say whether swelling improved/changed since stopped amlodipine - Reports wearing compression stockings at home . Report patient continues to have chronic dizziness. o Reports some improvement with taking positional changes slowly. Encourage continuing to take these changes slowly. o From review of chart, note dizziness discussed with PCP on 8/30 and provider recommended: Begin meclizine 12.5mg  TID PRN for dizziness and continue to increase fluids and drink an electrolyte replacer daily - Reports patient drinking water through out the day. - Reports patient had been drinking Propel, but had forgotten recently and will start again. - Patient and spouse unable to confirm today whether dizziness is improved with use of as needed meclizine . Advise patient/spouse to call office today to schedule follow up with PCP regarding BP, dizziness and ankle swelling . Will send coordination of care message to PCP  Patient Self Care Activities:  . UNABLE to self-administer medications as prescribed. Spouse fills weekly pillbox for patient . Attends all scheduled provider appointments with help of spouse  Please see past updates related to this goal by clicking on the "Past Updates" button in the selected goal         Plan  Telephone follow up appointment with care management team member scheduled for: 08/16/20  at 1:15 pm  Harlow Asa, PharmD, [place] [phone]

## 2020-06-23 NOTE — Patient Instructions (Signed)
Thank you allowing the Chronic Care Management Team to be a part of your care! It was a pleasure speaking with you today!     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  o Patient completed telemedicine visit with PCP on 9/13 for review of blood pressure. Patient advised to: - STOP amlodipine 5mg  daily and BEGIN HCTZ 12.5mg  daily - Check BP 1-2 x per day at home, keep log, and bring to clinic at next appointment. - Follow up 2 weeks.  o Note labs (CMP, lipid profile and CBC) ordered on 8/30 not completed  Follow up with patient and spouse regarding BP control and monitoring o Reports patient taking:  - diltiazem CD 240 mg once daily - HCTZ 12.5 mg once daily - Confirm stopped amlodipine as directed o Reports checking home BP and keeping log, but not checking daily as directed - Reports obtained new upper arm BP monitor today - Reports recent BP readings:  11/10: 140/64, HR 71  11/7: 124/58, HR 71  10/20: 139/62, HR 69 o Encourage patient and caregiver to check BP daily at home, keep log, and bring to medical  appointments o Report patient continues to have swelling in her ankles, but unable to say whether swelling improved/changed since stopped amlodipine - Reports wearing compression stockings at home  Report patient continues to have chronic dizziness. o Reports some improvement with taking positional changes slowly. Encourage continuing to take these changes slowly. o From review of chart, note dizziness discussed with PCP on 8/30 and provider recommended: Begin meclizine 12.5mg  TID PRN for dizziness and continue to increase fluids and drink an electrolyte replacer daily - Reports patient drinking water through out the day. - Reports patient had been drinking Propel, but had forgotten recently and will start again. - Patient and spouse unable to confirm today whether dizziness is improved with use of as needed meclizine  Advise patient/spouse to call office today to schedule follow up with PCP regarding BP, dizziness and ankle swelling  Will send coordination of care message to PCP  Patient Self Care Activities:   UNABLE to self-administer medications as prescribed. Spouse fills weekly pillbox for patient  Attends all scheduled provider appointments with help of spouse  Please see past updates related to this goal by clicking on the "Past Updates" button in the selected goal         Patient verbalizes understanding of instructions provided today.   Telephone follow up appointment with care management team member scheduled for: 08/16/20  at 1:15 pm  Harlow Asa, PharmD, [place] [phone]

## 2020-06-28 ENCOUNTER — Other Ambulatory Visit: Payer: Self-pay

## 2020-06-28 ENCOUNTER — Other Ambulatory Visit: Payer: Self-pay | Admitting: Family Medicine

## 2020-06-28 ENCOUNTER — Other Ambulatory Visit: Payer: Self-pay | Admitting: Nurse Practitioner

## 2020-06-28 ENCOUNTER — Encounter: Payer: Self-pay | Admitting: Podiatry

## 2020-06-28 ENCOUNTER — Ambulatory Visit: Payer: PPO | Admitting: Podiatry

## 2020-06-28 DIAGNOSIS — M79609 Pain in unspecified limb: Secondary | ICD-10-CM

## 2020-06-28 DIAGNOSIS — R413 Other amnesia: Secondary | ICD-10-CM

## 2020-06-28 DIAGNOSIS — M2041 Other hammer toe(s) (acquired), right foot: Secondary | ICD-10-CM

## 2020-06-28 DIAGNOSIS — M0579 Rheumatoid arthritis with rheumatoid factor of multiple sites without organ or systems involvement: Secondary | ICD-10-CM

## 2020-06-28 DIAGNOSIS — B351 Tinea unguium: Secondary | ICD-10-CM

## 2020-06-28 DIAGNOSIS — M2042 Other hammer toe(s) (acquired), left foot: Secondary | ICD-10-CM

## 2020-06-28 NOTE — Progress Notes (Signed)
This patient returns to my office for at risk foot care.  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  This patient is unable to cut nails herself since the patient cannot reach her nails.These nails are painful walking and wearing shoes.  This patient presents for at risk foot care today.  General Appearance  Alert, conversant and in no acute stress.  Vascular  Dorsalis pedis and posterior tibial  pulses are palpable  bilaterally.  Capillary return is within normal limits  bilaterally. Temperature is within normal limits  bilaterally.  Neurologic  Senn-Weinstein monofilament wire test within normal limits  bilaterally. Muscle power within normal limits bilaterally.  Nails Thick disfigured discolored nails with subungual debris  from hallux to fifth toes bilaterally. No evidence of bacterial infection or drainage bilaterally.  Orthopedic  No limitations of motion  feet .  No crepitus or effusions noted.  No bony pathology or digital deformities noted. 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  Hammer toes second  B/L.  Skin  normotropic skin with no porokeratosis noted bilaterally.  No signs of infections or ulcers noted.     Onychomycosis  Pain in right toes  Pain in left toes  Consent was obtained for treatment procedures.   Mechanical debridement of nails 1-5  bilaterally performed with a nail nipper.  Filed with dremel without incident.  Padding second toe  B/L.   Return office visit   3 months                   Told patient to return for periodic foot care and evaluation due to potential at risk complications.   Ivor Kishi DPM  

## 2020-06-28 NOTE — Telephone Encounter (Signed)
Requested medication (s) are due for refill today - discontinued medication  Requested medication (s) are on the active medication list -no  Future visit scheduled -yes  Last refill: 09/01/19  Notes to clinic: Request for RF of medication no longer on current medication list  Requested Prescriptions  Pending Prescriptions Disp Refills   donepezil (ARICEPT) 5 MG tablet [Pharmacy Med Name: DONEPEZIL HCL 5 MG TAB] 90 tablet 1    Sig: TAKE ONE TABLET AT BEDTIME      Neurology:  Alzheimer's Agents Passed - 06/28/2020  1:12 PM      Passed - Valid encounter within last 6 months    Recent Outpatient Visits           2 months ago Essential hypertension, benign   [facility], [name], FNP   2 months ago Essential hypertension, benign   [facility], [name], FNP   3 months ago Essential hypertension, benign   [facility], [name], FNP   7 months ago Short-term memory loss   [place], FNP   8 months ago Essential hypertension, benign   [facility], [name], FNP       Future Appointments             In 2 weeks [place], [facility], [facility]                Requested Prescriptions  Pending Prescriptions Disp Refills   donepezil (ARICEPT) 5 MG tablet [Pharmacy Med Name: DONEPEZIL HCL 5 MG TAB] 90 tablet 1    Sig: TAKE ONE TABLET AT BEDTIME      Neurology:  Alzheimer's Agents Passed - 06/28/2020  1:12 PM      Passed - Valid encounter within last 6 months    Recent Outpatient Visits           2 months ago Essential hypertension, benign   [facility], [name], FNP   2 months ago Essential hypertension, benign   [facility], [name], FNP   3 months ago Essential hypertension, benign   [facility], [name], FNP   7 months ago Short-term memory loss   [place], FNP   8 months ago Essential hypertension, benign   [facility], [name], FNP       Future Appointments             In 2 weeks [place], [name], [facility], [facility]

## 2020-07-12 ENCOUNTER — Ambulatory Visit: Payer: PPO | Admitting: Family Medicine

## 2020-07-12 ENCOUNTER — Other Ambulatory Visit: Payer: Self-pay

## 2020-07-12 ENCOUNTER — Encounter: Payer: Self-pay | Admitting: Family Medicine

## 2020-07-12 ENCOUNTER — Ambulatory Visit (INDEPENDENT_AMBULATORY_CARE_PROVIDER_SITE_OTHER): Payer: PPO | Admitting: Family Medicine

## 2020-07-12 VITALS — BP 123/45 | HR 65 | Temp 98.1°F | Ht 67.0 in | Wt 127.8 lb

## 2020-07-12 DIAGNOSIS — M0579 Rheumatoid arthritis with rheumatoid factor of multiple sites without organ or systems involvement: Secondary | ICD-10-CM

## 2020-07-12 DIAGNOSIS — R42 Dizziness and giddiness: Secondary | ICD-10-CM

## 2020-07-12 NOTE — Patient Instructions (Addendum)
STOP the hydrochlorothiazide 12.5mg  daily and continue to take blood pressure a few times per week.  We will see you back in 2 weeks to see how your dizziness is.  If this does not improve your symptoms, we will have you follow up with Neurology for additional evaluation.  A referral to Rheumatology to re-establish for your rheumatoid arthritis has been placed today.  If you have not heard from the specialty office or our referral coordinator within 1 week, please let us know and we will follow up with the referral coordinator for an update.  We will plan to see you back in 2 weeks for dizziness re-evaluation  You will receive a survey after today's visit either digitally by e-mail or paper by [place] mail. Your experiences and feedback matter to [place].  Please respond so we know how we are doing as we provide care for you.  Call us with any questions/concerns/needs.  It is my goal to be available to you for your health concerns.  Thanks for choosing me to be a partner in your healthcare needs!  Harlin Rain, FNP-C Family Nurse Practitioner [facility] Phone: [phone]

## 2020-07-12 NOTE — Progress Notes (Signed)
Subjective:    Patient ID: Kristin [name], female    DOB: 06/29/1941, 79 y.o.   MRN: [id number]  JAYLAN DUGGAR is a 79 y.o. female presenting on 07/12/2020 for Dizziness (intermittent dizziness, w/ blurry vision. She also complains of the feeling off balance when she stands.  )   HPI  Kristin [name] presents to clinic for a follow up on her dizziness.  Reports that she has been having continued intermittent dizziness with some blurry vision.  Has some feelings of being off balance when she walks at times.  States dizziness does happen at rest and not only with positional changes, feels more lightheaded and room does not spin/move around her.  Is unsure of her water intake but believes she drinks enough water daily.    She has concerns for continued joint pain and a history of rheumatoid arthritis.  Has last met with Rheumatology in 2016 with [facility].  Spouse, [name], has brought a list of biologic medications that they are interested in discussing/starting for Ms. Ronnald Ramp' joint pain.  Depression screen [facility] 2/9 12/18/2019 11/04/2019 03/04/2019  Decreased Interest 0 0 0  Down, Depressed, Hopeless 0 0 0  PHQ - 2 Score 0 0 0  Altered sleeping - - -  Tired, decreased energy - - -  Change in appetite - - -  Feeling bad or failure about yourself  - - -  Trouble concentrating - - -  Moving slowly or fidgety/restless - - -  Suicidal thoughts - - -  PHQ-9 Score - - -  Difficult doing work/chores - - -  Some recent data might be hidden    Social History   Tobacco Use  . Smoking status: Never Smoker  . Smokeless tobacco: Never Used  Vaping Use  . Vaping Use: Never used  Substance Use Topics  . Alcohol use: No    Alcohol/week: 0.0 standard drinks    Comment: Rare  . Drug use: No    Review of Systems  Constitutional: Negative.   HENT: Negative.   Eyes: Negative.   Respiratory: Negative.   Cardiovascular: Negative.   Gastrointestinal: Negative.   Endocrine: Negative.     Genitourinary: Negative.   Musculoskeletal: Positive for arthralgias. Negative for back pain, joint swelling, myalgias, neck pain and neck stiffness.  Skin: Negative.   Allergic/Immunologic: Negative.   Neurological: Positive for dizziness. Negative for tremors, seizures, syncope, facial asymmetry, speech difficulty, weakness, light-headedness, numbness and headaches.  Hematological: Negative.   Psychiatric/Behavioral: Negative.    Per HPI unless specifically indicated above     Objective:    BP (!) 123/45 (BP Location: Right Arm, Patient Position: Sitting, Cuff Size: Normal)   Pulse 65   Temp 98.1 F (36.7 C) (Oral)   Ht 5' 7"  (1.702 m)   Wt 127 lb 12.8 oz (58 kg)   LMP 08/14/1989   BMI 20.02 kg/m   Wt Readings from Last 3 Encounters:  07/12/20 127 lb 12.8 oz (58 kg)  04/12/20 127 lb 12.8 oz (58 kg)  03/15/20 125 lb 12.8 oz (57.1 kg)    Physical Exam Vitals and nursing note reviewed.  Constitutional:      General: She is not in acute distress.    Appearance: Normal appearance. She is well-developed, well-groomed and normal weight. She is not ill-appearing or toxic-appearing.  HENT:     Head: Normocephalic and atraumatic.     Nose:     Comments: Lizbeth Bark is in place, covering mouth and nose. Eyes:  General: Lids are normal. Vision grossly intact.        Right eye: No discharge.        Left eye: No discharge.     Extraocular Movements: Extraocular movements intact.     Conjunctiva/sclera: Conjunctivae normal.     Pupils: Pupils are equal, round, and reactive to light.  Cardiovascular:     Rate and Rhythm: Normal rate and regular rhythm.     Pulses: Normal pulses.     Heart sounds: Normal heart sounds. No murmur heard.  No friction rub. No gallop.   Pulmonary:     Effort: Pulmonary effort is normal. No respiratory distress.     Breath sounds: Normal breath sounds.  Musculoskeletal:        General: Tenderness present.     Right lower leg: No edema.     Left  lower leg: No edema.     Comments: Generalized arthralgias throughout joints.  Unable to appreciate joint swelling on exam.  Skin:    General: Skin is warm and dry.     Capillary Refill: Capillary refill takes less than 2 seconds.  Neurological:     General: No focal deficit present.     Mental Status: She is alert and oriented to person, place, and time. Mental status is at baseline.     Cranial Nerves: No cranial nerve deficit.     Motor: No weakness.     Gait: Gait normal.  Psychiatric:        Attention and Perception: Attention and perception normal.        Mood and Affect: Mood and affect normal.        Speech: Speech normal.        Behavior: Behavior normal. Behavior is cooperative.        Thought Content: Thought content normal.        Cognition and Memory: Cognition and memory normal.        Judgment: Judgment normal.    Results for orders placed or performed in visit on 04/12/20  Urine Culture   Specimen: Urine  Result Value Ref Range   MICRO NUMBER: [id number]    SPECIMEN QUALITY: Adequate    Sample Source URINE    STATUS: FINAL    Result: No Growth   POCT Urinalysis Dipstick  Result Value Ref Range   Color, UA dark yellow    Clarity, UA clear    Glucose, UA Negative Negative   Bilirubin, UA negative    Ketones, UA negative    Spec Grav, UA 1.010 1.010 - 1.025   Blood, UA negative    pH, UA 5.0 5.0 - 8.0   Protein, UA Negative Negative   Urobilinogen, UA 0.2 0.2 or 1.0 E.U./dL   Nitrite, UA negative    Leukocytes, UA Trace (A) Negative   Appearance     Odor        Assessment & Plan:   Problem List Items Addressed This Visit      Musculoskeletal and Integument   Rheumatoid arthritis ([place])    Previously followed with Dr. Dossie Der with [place] in 2016 and not interested in returning to that specialist. Spouse and patient were interested in starting on biologic therapy for patient's RA.  Discussed referral to Rheumatology for their specialty and discussion  on medication choices.  Referral to rheumatology in past.      Relevant Orders   Ambulatory referral to Rheumatology     Other   Dizziness - Primary  No change in symptoms with meclizine.  Reports dizziness in seated positions at time as well.  Will stop HCTZ 12.70m today and have patient and husband recheck BP 1-2x per week for the next 2 weeks and see if this improves her concerns of dizziness.  If no improvement, will refer to Neurology.  Patient and spouse in agreement with plan.         No orders of the defined types were placed in this encounter.  Follow up plan: Return in about 2 weeks (around 07/26/2020) for Dizziness follow up visit.   NHarlin Rain FDay ValleyFamily Nurse Practitioner [facility] 07/12/2020, 3:32 PM

## 2020-07-12 NOTE — Assessment & Plan Note (Signed)
No change in symptoms with meclizine.  Reports dizziness in seated positions at time as well.  Will stop HCTZ 12.5mg  today and have patient and husband recheck BP 1-2x per week for the next 2 weeks and see if this improves her concerns of dizziness.  If no improvement, will refer to Neurology.  Patient and spouse in agreement with plan.

## 2020-07-12 NOTE — Assessment & Plan Note (Signed)
Previously followed with Dr. Dossie Der with [facility] in 2016 and not interested in returning to that specialist. Spouse and patient were interested in starting on biologic therapy for patient's RA.  Discussed referral to Rheumatology for their specialty and discussion on medication choices.  Referral to rheumatology in past.

## 2020-07-26 ENCOUNTER — Telehealth (INDEPENDENT_AMBULATORY_CARE_PROVIDER_SITE_OTHER): Payer: PPO | Admitting: Family Medicine

## 2020-07-26 ENCOUNTER — Other Ambulatory Visit: Payer: Self-pay

## 2020-07-26 ENCOUNTER — Encounter: Payer: Self-pay | Admitting: Family Medicine

## 2020-07-26 DIAGNOSIS — R42 Dizziness and giddiness: Secondary | ICD-10-CM

## 2020-07-26 NOTE — Progress Notes (Signed)
Virtual Visit via Telephone  The purpose of this virtual visit is to provide medical care while limiting exposure to the novel coronavirus (COVID19) for both patient and office staff.  Consent was obtained for phone visit:  Yes.   Answered questions that patient had about telehealth interaction:  Yes.   I discussed the limitations, risks, security and privacy concerns of performing an evaluation and management service by telephone. I also discussed with the patient that there may be a patient responsible charge related to this service. The patient expressed understanding and agreed to proceed.  Patient is at home and is accessed via telephone Services are provided by Harlin Rain, FNP-C from [facility])  ---------------------------------------------------------------------- Chief Complaint  Patient presents with  . Dizziness    Intermittent dizziness x 2 week. The husband state that he is not sure if her symptoms are associated with her dementia.    S: Reviewed CMA documentation. I have called patient and gathered additional HPI as follows:  Ms. Reimann presents for virtual telemedicine visit with her husband, Darnell Level, via telephone.  Reports they have stopped her hydrochlorothiazide 12.5mg  over the past 2 weeks and her BP readings have been running around 137/68.  Ms. Fouts reports no change in her dizziness.  As discussed at her last visit, will refer to Neurology.  Patient and spouse in agreement.  Patient is currently home Denies any high risk travel to areas of current concern for COVID19. Denies any known or suspected exposure to person with or possibly with COVID19.  Past Medical History:  Diagnosis Date  . Allergy   . Arthritis    recent falls -aug 2010  . [name] (coronary artery disease)    mi-1996  . Complication of anesthesia    pt states takes a long time to wake her wake up  . 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 Cough    no fever   . Degeneration of lumbar or lumbosacral intervertebral disc   . Dizziness    r/t meds  . DVT (deep venous thrombosis) ([place])   . Fibromyalgia   . History of shingles   . [name] (irritable bowel syndrome)    takes OTC [place] Negus colon health  . Joint pain   . Joint swelling   . Myalgia and myositis, unspecified   . Obstructive sleep apnea (adult) (pediatric)   . Old myocardial infarction 1996  . Osteoarthritis   . Osteoarthrosis, unspecified whether generalized or localized, unspecified site   . Psoriatic arthritis ([place])   . Rheumatoid arthritis ([place])   . Rosacea   . Seasonal allergies   . Stroke [facility])    ischaemic microvascular disease  . 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 Unspecified essential hypertension    takes Diltiazem,Metoprolol,and Losartan daily  . UTI (lower urinary tract infection)    frequent=last one was winter of 2014   Social History   Tobacco Use  . Smoking status: Never Smoker  . Smokeless tobacco: Never Used  Vaping Use  . Vaping Use: Never used  Substance Use Topics  . Alcohol use: No    Alcohol/week: 0.0 standard drinks    Comment: Rare  . Drug use: No    Current Outpatient Medications:  .  acetaminophen (TYLENOL) 325 MG tablet, Take 325 mg by mouth every 8 (eight) hours as needed (pain)., Disp: , Rfl:  .  ASPIRIN 81 PO, Take 1 tablet by mouth daily., Disp: , Rfl:  .  betamethasone dipropionate (DIPROLENE) 0.05 % ointment, APPLY TO AFFECTED AREAS TWICE DAILY AS NEEDED, Disp: , Rfl:  .  Calcium Carbonate-Vitamin D (CALTRATE 600+D PO), Take 1 tablet by mouth daily. , Disp: , Rfl:  .  Cranberry 400 MG TABS, Take 1 tablet by mouth daily., Disp: , Rfl:  .  diltiazem (CARDIZEM CD) 240 MG 24 hr capsule, Take 1 capsule (240 mg total) by mouth daily., Disp: 90 capsule, Rfl: 3 .  donepezil (ARICEPT) 5 MG tablet, TAKE ONE TABLET AT  BEDTIME, Disp: 90 tablet, Rfl: 1 .  Lactobacillus Rhamnosus, GG, (CULTURELLE PO), Take 1 capsule by mouth daily., Disp: , Rfl:  .  meclizine (ANTIVERT) 12.5 MG tablet, Take 1 tablet (12.5 mg total) by mouth 3 (three) times daily as needed for dizziness., Disp: 270 tablet, Rfl: 1 .  memantine (NAMENDA) 10 MG tablet, TAKE 1 TABLET BY MOUTH TWICE DAILY, Disp: 180 tablet, Rfl: 0 .  Multiple Vitamin (MULTIVITAMIN) tablet, Take 1 tablet by mouth daily., Disp: , Rfl:  .  nitroGLYCERIN (NITROSTAT) 0.4 MG SL tablet, Place 1 tablet (0.4 mg total) under the tongue every 5 (five) minutes as needed for chest pain., Disp: 25 tablet, Rfl: 3 .  Omega-3 Fatty Acids (FISH OIL) 1200 MG CAPS, Take 1 capsule by mouth daily. , Disp: , Rfl:  .  Polyethyl Glycol-Propyl Glycol 0.4-0.3 % SOLN, Apply 1 drop to eye daily as needed (dry eyes)., Disp: , Rfl:  .  Red Yeast Rice 600 MG CAPS, Take 2 capsules by mouth daily., Disp: , Rfl:  .  traMADol (ULTRAM) 50 MG tablet, TAKE ONE TABLET (50 MG) BY MOUTH EVERY 12 HOURS AS NEEDED FOR MODERATE PAIN, Disp: 45 tablet, Rfl: 0  Depression screen [facility] 2/9 12/18/2019 11/04/2019 03/04/2019  Decreased Interest 0 0 0  Down, Depressed, Hopeless 0 0 0  PHQ - 2 Score 0 0 0  Altered sleeping - - -  Tired, decreased energy - - -  Change in appetite - - -  Feeling bad or failure about yourself  - - -  Trouble concentrating - - -  Moving slowly or fidgety/restless - - -  Suicidal thoughts - - -  PHQ-9 Score - - -  Difficult doing work/chores - - -  Some recent data might be hidden    No flowsheet data found.  -------------------------------------------------------------------------- O: No physical exam performed due to remote telephone encounter.  Physical Exam: Patient remotely monitored without video.  Verbal communication appropriate.  Cognition normal.  No results found for this or any previous visit (from the past 2160  hour(s)).  -------------------------------------------------------------------------- A&P:  Problem List Items Addressed This Visit      Other   Dizziness - Primary    No change reported in dizziness with stopping of HCTZ and improvement in BP.  Unsure if this is consistent with her dx of dementia as it subjective.  Patient has not had any falls and husband does not endorse any concerns with her ambulation.  Discussed at last visit if no improvement with dizziness with reduction of HCTZ would refer to neurology, patient and spouse in agreement.  Referral to neurology placed.      Relevant Orders   Ambulatory referral to Neurology      No orders of the defined types were placed in this encounter.   Follow-up: - Return after meeting with Neurology for evaluation  Patient verbalizes understanding with  the above medical recommendations including the limitation of remote medical advice.  Specific follow-up and call-back criteria were given for patient to follow-up or seek medical care more urgently if needed.  - Time spent in direct consultation with patient on phone: 7 minutes  Harlin Rain, [facility] 07/26/2020, 1:25 PM

## 2020-07-26 NOTE — Assessment & Plan Note (Signed)
No change reported in dizziness with stopping of HCTZ and improvement in BP.  Unsure if this is consistent with her dx of dementia as it subjective.  Patient has not had any falls and husband does not endorse any concerns with her ambulation.  Discussed at last visit if no improvement with dizziness with reduction of HCTZ would refer to neurology, patient and spouse in agreement.  Referral to neurology placed.

## 2020-07-29 ENCOUNTER — Ambulatory Visit: Payer: Self-pay | Admitting: General Practice

## 2020-07-29 ENCOUNTER — Telehealth: Payer: PPO | Admitting: General Practice

## 2020-07-29 DIAGNOSIS — E782 Mixed hyperlipidemia: Secondary | ICD-10-CM

## 2020-07-29 DIAGNOSIS — M159 Polyosteoarthritis, unspecified: Secondary | ICD-10-CM

## 2020-07-29 DIAGNOSIS — M150 Primary generalized (osteo)arthritis: Secondary | ICD-10-CM

## 2020-07-29 DIAGNOSIS — R413 Other amnesia: Secondary | ICD-10-CM

## 2020-07-29 DIAGNOSIS — M0579 Rheumatoid arthritis with rheumatoid factor of multiple sites without organ or systems involvement: Secondary | ICD-10-CM

## 2020-07-29 DIAGNOSIS — R42 Dizziness and giddiness: Secondary | ICD-10-CM

## 2020-07-29 DIAGNOSIS — I10 Essential (primary) hypertension: Secondary | ICD-10-CM

## 2020-07-29 NOTE — Patient Instructions (Signed)
Visit Information  Goals Addressed              This Visit's Progress     RNCM: pt:"I get dizzy and loopy a lot" (pt-stated)        CARE PLAN ENTRY (see longitudinal plan of care for additional care plan information)  Current Barriers:   Knowledge Deficits related to resources in the community to help in the home during the day while the patient's husband runs errands and does volunteer work  Care Coordination needs related to resources for services to help  in a patient with memory loss and dizziness  (disease states)  Chronic Disease Management support and education needs related to memory loss and mental capacity functions  Lacks caregiver support.   Nurse Case Manager Clinical Goal(s):   Over the next 120 days, patient will verbalize understanding of plan for finding resources in the community to help with patient needs when the patient's husband is running errands or doing volunteer work  Over the next 120 days, patient will work with [employer], CCM team and pcp to address needs related to patients needs related to decline in mental capacity and memory loss  Over the next 120 days, patient will work with care guides (community agency) to have available resources to use in the community to assist with the needs of the patient with memory loss  Interventions:   Inter-disciplinary care team collaboration (see longitudinal plan of care)  Advised patient to call the pcp for changes in memory or worsening s/s of dizziness   Provided education to patient re: working with the patient and her husband to help with resources for assistance in the home when her husband is running errands or doing volunteer work.  07-30-2020: The patient and husband have resources but the patient is not interested at this time in going to places during the day.   Evaluation of current routine. The patient states that she does well right now when her husband is out running errands. The patient likes to knit  and do crafts. The patient does have issues with memory loss and dizziness but endorses being safe at home and no falls. 04-08-2020:  Per the patients husband the patients memory is worse since the last call. The dizziness she is having is still there but some days are worse than others. The patients husband states they are following the recommendations of the pcp but she is still complaining of dizziness with no relation to time or particular events. She is drinking plenty of fluids such as water and Gatorade and eating well.  The patient has had no falls per the husband. The patient is still working with her crafts and this she enjoys. They did go and [place], the husband liked the facility but the patient refused to try it out. He has the number on hand to call in the future if needed.  Did discuss the likelihood of the patients memory continuing to decline and the concern for safety. The husband verbalized understanding.The husband still is able to do his volunteer work at this time. The patients husband denies any needs at this time.  07-29-2020: The patient says the dizziness seems to be better today. Has a visit with the pcp on 07-26-2020 and the pcp is referring the patient to neurology.  They have not heard from neurology yet but know referral has been placed.    Collaborated with CCM team, pcp and care guides regarding available resources to help in  the home or day cares in the area to help with patient with decline in memory.  04-08-2020: Resources are available but the patient is declining at this time. The patients husband knows the CCM team and pcp are here to support the patient and him.   Discussed plans with patient for ongoing care management follow up and provided patient with direct contact information for care management team  Care Guide referral for resources in the community to help with adult day care resources or in home services to help when the patients husband is  out doing errands or volunteer work.   Discussed self care of the patients husband. He states that he is doing well and taking care of himself. He still is able to get out and do his volunteer work and this helps him a lot.   Patient Self Care Activities:   Patient verbalizes understanding of plan to have CCM team work with her and her husband to help find resources in the community to provide adult day care options or in home day care help  Attends all scheduled provider appointments  Performs ADL's independently  Calls provider office for new concerns or questions  Unable to independently manage care during the day when the patients husband is out running errands or doing volunteer work as evidence of patient having dizziness and memory loss.   Please see past updates related to this goal by clicking on the "Past Updates" button in the selected goal        RNCM:"I don't check my blood pressure a lot" (pt-stated)        CARE PLAN ENTRY (see longtitudinal plan of care for additional care plan information)  Current Barriers:   Chronic Disease Management support, education, and care coordination needs related to HTN and HLD  Clinical Goal(s) related to HTN and HLD:  Over the next 120 days, patient will:   Work with the care management team to address educational, disease management, and care coordination needs   Begin or continue self health monitoring activities as directed today Measure and record blood pressure 2 times per week and adhere to a heart healthy diet  Call provider office for new or worsened signs and symptoms Blood pressure findings outside established parameters and New or worsened symptom related to HTN/HLD and other chronic conditions  Call care management team with questions or concerns  Verbalize basic understanding of patient centered plan of care established today  Interventions related to HTN and HLD:   Evaluation of current treatment plans and  patient's adherence to plan as established by provider.  07-29-2020: Consistently taking blood pressures at this time. The patient states that it is stable. The patients husband endorses following recommendations by the provider.   Assessed patient understanding of disease states.  Per the husband he and the patient understand her conditions well. They check her blood pressure at home and it is good. 06-03-2020: The patients husband gave blood pressure readings for last several days:  05-23-2020: 129/64 and 134/65, 05-26-2020 130/67, 06-02-2020 139/62.  The patient has switched from Amlodipine to HCTZ 12.5mg .  The patient states she is still having swelling in her ankles. Is going to start using ted hose.  Will collaborate with the pcp concerning continued swelling in ankles. Have sent an in basket message for the front office to call and get a follow up appointment for the patient. 07-29-2020: States the swelling in feet is better. The patient verbalized she is doing well except fo the  back and knee pain. She is using tramadol but with little relief. Education on using heat. The patients husband states they will try that. The patient states she has tried than and it does not work. Empathetic listening and support given.   Assessed patient's education and care coordination needs.  Per the husband there have been no new changes in the patients condition. Ask if they had talked with the cardiologist about adding a statin to her medication therapy and the husband states they have not. Will continue to follow up in subsequent calls.   Provided disease specific education to patient.  06-03-2020: The patient is elevating legs when sitting and is going to use ted hose to help with the edema. The patient states that her ankles have been more swollen lately. 07-29-2020: Education on calling the pcp for changes in level or intensity of pain. The patient states that she will notify the pcp for changes or unresolved pain.    Collaborated with appropriate clinical care team members regarding patient needs.  Evaluation of upcoming appointments.  The patient has no upcoming appointments to see the pcp. Knows to call for changes in condition. Has RNCM number also for changes in condition.   Patient Self Care Activities related to HTN and HLD:   Patient is unable to independently self-manage chronic health conditions  Please see past updates related to this goal by clicking on the "Past Updates" button in the selected goal         The patient verbalized understanding of instructions, educational materials, and care plan provided today and declined offer to receive copy of patient instructions, educational materials, and care plan.   Telephone follow up appointment with care management team member scheduled for: 09-23-2020 at 1 pm  Noreene Larsson RN, MSN, [facility] Mobile: [phone]

## 2020-07-29 NOTE — Chronic Care Management (AMB) (Signed)
Chronic Care Management   Follow Up Note   07/29/2020 Name: Kristin [name] MRN: [id number] DOB: 05-21-1941  Referred by: Verl Bangs, FNP Reason for referral : Chronic Care Management (RNCM: Chronic Disease Management and Care Coordination Needs)   Kristin [name] is a 79 y.o. year old female who is a primary care patient of Verl Bangs, FNP. The CCM team was consulted for assistance with chronic disease management and care coordination needs.    Review of patient status, including review of consultants reports, relevant laboratory and other test results, and collaboration with appropriate care team members and the patient's provider was performed as part of comprehensive patient evaluation and provision of chronic care management services.    SDOH (Social Determinants of Health) assessments performed: Yes See Care Plan activities for detailed interventions related to [facility])     Outpatient Encounter Medications as of 07/29/2020  Medication Sig  . acetaminophen (TYLENOL) 325 MG tablet Take 325 mg by mouth every 8 (eight) hours as needed (pain).  . ASPIRIN 81 PO Take 1 tablet by mouth daily.  . betamethasone dipropionate (DIPROLENE) 0.05 % ointment APPLY TO AFFECTED AREAS TWICE DAILY AS NEEDED  . Calcium Carbonate-Vitamin D (CALTRATE 600+D PO) Take 1 tablet by mouth daily.   . Cranberry 400 MG TABS Take 1 tablet by mouth daily.  [name] diltiazem (CARDIZEM CD) 240 MG 24 hr capsule Take 1 capsule (240 mg total) by mouth daily.  [name] donepezil (ARICEPT) 5 MG tablet TAKE ONE TABLET AT BEDTIME  . Lactobacillus Rhamnosus, GG, (CULTURELLE PO) Take 1 capsule by mouth daily.  . meclizine (ANTIVERT) 12.5 MG tablet Take 1 tablet (12.5 mg total) by mouth 3 (three) times daily as needed for dizziness.  . memantine (NAMENDA) 10 MG tablet TAKE 1 TABLET BY MOUTH TWICE DAILY  . Multiple Vitamin (MULTIVITAMIN) tablet Take 1 tablet by mouth daily.  . nitroGLYCERIN (NITROSTAT) 0.4 MG SL tablet Place 1  tablet (0.4 mg total) under the tongue every 5 (five) minutes as needed for chest pain.  . Omega-3 Fatty Acids (FISH OIL) 1200 MG CAPS Take 1 capsule by mouth daily.   Vladimir Faster Glycol-Propyl Glycol 0.4-0.3 % SOLN Apply 1 drop to eye daily as needed (dry eyes).  . Red Yeast Rice 600 MG CAPS Take 2 capsules by mouth daily.  . traMADol (ULTRAM) 50 MG tablet TAKE ONE TABLET (50 MG) BY MOUTH EVERY 12 HOURS AS NEEDED FOR MODERATE PAIN   No facility-administered encounter medications on file as of 07/29/2020.     Objective:  BP Readings from Last 3 Encounters:  07/12/20 (!) 123/45  06/02/20 139/62  04/12/20 (!) 142/55    Goals Addressed              This Visit's Progress   .  RNCM: pt:"I get dizzy and loopy a lot" (pt-stated)        CARE PLAN ENTRY (see longitudinal plan of care for additional care plan information)  Current Barriers:  [name] Knowledge Deficits related to resources in the community to help in the home during the day while the patient's husband runs errands and does volunteer work . Care Coordination needs related to resources for services to help  in a patient with memory loss and dizziness  (disease states) . Chronic Disease Management support and education needs related to memory loss and mental capacity functions . Lacks caregiver support.   Nurse Case Manager Clinical Goal(s):  [name] Over the next 120 days, patient will verbalize  understanding of plan for finding resources in the community to help with patient needs when the patient's husband is running errands or doing volunteer work . Over the next 120 days, patient will work with [facility] team and pcp to address needs related to patients needs related to decline in mental capacity and memory loss . Over the next 120 days, patient will work with care guides (community agency) to have available resources to use in the community to assist with the needs of the patient with memory loss  Interventions:   . Inter-disciplinary care team collaboration (see longitudinal plan of care) . Advised patient to call the pcp for changes in memory or worsening s/s of dizziness  . Provided education to patient re: working with the patient and her husband to help with resources for assistance in the home when her husband is running errands or doing volunteer work.  07-30-2020: The patient and husband have resources but the patient is not interested at this time in going to places during the day.  . Evaluation of current routine. The patient states that she does well right now when her husband is out running errands. The patient likes to knit and do crafts. The patient does have issues with memory loss and dizziness but endorses being safe at home and no falls. 04-08-2020:  Per the patients husband the patients memory is worse since the last call. The dizziness she is having is still there but some days are worse than others. The patients husband states they are following the recommendations of the pcp but she is still complaining of dizziness with no relation to time or particular events. She is drinking plenty of fluids such as water and Gatorade and eating well.  The patient has had no falls per the husband. The patient is still working with her crafts and this she enjoys. They did go and [place], the husband liked the facility but the patient refused to try it out. He has the number on hand to call in the future if needed.  Did discuss the likelihood of the patients memory continuing to decline and the concern for safety. The husband verbalized understanding.The husband still is able to do his volunteer work at this time. The patients husband denies any needs at this time.  07-29-2020: The patient says the dizziness seems to be better today. Has a visit with the pcp on 07-26-2020 and the pcp is referring the patient to neurology.  They have not heard from neurology yet but know referral has been placed.    Nash Dimmer with CCM team, pcp and care guides regarding available resources to help in the home or day cares in the area to help with patient with decline in memory.  04-08-2020: Resources are available but the patient is declining at this time. The patients husband knows the CCM team and pcp are here to support the patient and him.  . Discussed plans with patient for ongoing care management follow up and provided patient with direct contact information for care management team . Care Guide referral for resources in the community to help with adult day care resources or in home services to help when the patients husband is out doing errands or volunteer work.  . Discussed self care of the patients husband. He states that he is doing well and taking care of himself. He still is able to get out and do his volunteer work and this helps him a lot.   Patient  Self Care Activities:  . Patient verbalizes understanding of plan to have CCM team work with her and her husband to help find resources in the community to provide adult day care options or in home day care help . Attends all scheduled provider appointments . Performs ADL's independently . Calls provider office for new concerns or questions . Unable to independently manage care during the day when the patients husband is out running errands or doing volunteer work as evidence of patient having dizziness and memory loss.   Please see past updates related to this goal by clicking on the "Past Updates" button in the selected goal      .  RNCM:"I don't check my blood pressure a lot" (pt-stated)        [place] (see longtitudinal plan of care for additional care plan information)  Current Barriers:  . Chronic Disease Management support, education, and care coordination needs related to HTN and HLD  Clinical Goal(s) related to HTN and HLD:  Over the next 120 days, patient will:  . Work with the care management team to address educational,  disease management, and care coordination needs  . Begin or continue self health monitoring activities as directed today Measure and record blood pressure 2 times per week and adhere to a heart healthy diet . Call provider office for new or worsened signs and symptoms Blood pressure findings outside established parameters and New or worsened symptom related to HTN/HLD and other chronic conditions . Call care management team with questions or concerns . Verbalize basic understanding of patient centered plan of care established today  Interventions related to HTN and HLD:  . Evaluation of current treatment plans and patient's adherence to plan as established by provider.  07-29-2020: Consistently taking blood pressures at this time. The patient states that it is stable. The patients husband endorses following recommendations by the provider.  . Assessed patient understanding of disease states.  Per the husband he and the patient understand her conditions well. They check her blood pressure at home and it is good. 06-03-2020: The patients husband gave blood pressure readings for last several days:  05-23-2020: 129/64 and 134/65, 05-26-2020 130/67, 06-02-2020 139/62.  The patient has switched from Amlodipine to HCTZ 12.5mg .  The patient states she is still having swelling in her ankles. Is going to start using ted hose.  Will collaborate with the pcp concerning continued swelling in ankles. Have sent an in basket message for the front office to call and get a follow up appointment for the patient. 07-29-2020: States the swelling in feet is better. The patient verbalized she is doing well except fo the back and knee pain. She is using tramadol but with little relief. Education on using heat. The patients husband states they will try that. The patient states she has tried than and it does not work. Empathetic listening and support given.  . Assessed patient's education and care coordination needs.  Per the  husband there have been no new changes in the patients condition. Ask if they had talked with the cardiologist about adding a statin to her medication therapy and the husband states they have not. Will continue to follow up in subsequent calls.  . Provided disease specific education to patient.  06-03-2020: The patient is elevating legs when sitting and is going to use ted hose to help with the edema. The patient states that her ankles have been more swollen lately. 07-29-2020: Education on calling the pcp for changes in  level or intensity of pain. The patient states that she will notify the pcp for changes or unresolved pain.  Nash Dimmer with appropriate clinical care team members regarding patient needs. . Evaluation of upcoming appointments.  The patient has no upcoming appointments to see the pcp. Knows to call for changes in condition. Has RNCM number also for changes in condition.   Patient Self Care Activities related to HTN and HLD:  . Patient is unable to independently self-manage chronic health conditions  Please see past updates related to this goal by clicking on the "Past Updates" button in the selected goal           Plan:   Telephone follow up appointment with care management team member scheduled for: 09-23-2020 at 1 pm   Noreene Larsson RN, MSN, [facility] Mobile: [phone]

## 2020-08-09 ENCOUNTER — Other Ambulatory Visit: Payer: Self-pay | Admitting: Family

## 2020-08-09 DIAGNOSIS — I493 Ventricular premature depolarization: Secondary | ICD-10-CM

## 2020-08-09 NOTE — Telephone Encounter (Signed)
Rx request sent to pharmacy.  

## 2020-08-16 ENCOUNTER — Telehealth: Payer: Self-pay

## 2020-08-16 ENCOUNTER — Telehealth: Payer: PPO

## 2020-08-16 NOTE — Telephone Encounter (Signed)
I contacted the patient husband and notified him that I will send a message to the pharmacologist to call back and reschedule his wife upcoming appt.

## 2020-08-16 NOTE — Telephone Encounter (Signed)
Copied from CRM [id number]. Topic: General - Inquiry >> Aug 16, 2020 11:44 AM Daphine Deutscher D wrote: Reason for CRM: Pt left message that they were calling back to resch the pharmacy visit  CB# [phone]

## 2020-08-17 NOTE — Telephone Encounter (Signed)
Looks like this appt has already been r/s for 09/13/2020

## 2020-08-25 ENCOUNTER — Telehealth: Payer: Self-pay

## 2020-08-25 NOTE — Chronic Care Management (AMB) (Signed)
  Care Management   Note  08/25/2020 Name: CARLIN MAMONE MRN: [id number] DOB: Sep 25, 1940  ASHANTIA AMARAL is a 80 y.o. year old female who is a primary care patient of [name], [name], [place] and is actively engaged with the care management team. I reached out to [name] by phone today to assist with re-scheduling a follow up visit with the Pharmacist  Follow up plan: Unsuccessful telephone outreach attempt made. A HIPAA compliant phone message was left for the patient providing contact information and requesting a return call.  The care management team will reach out to the patient again over the next 5 days.  If patient returns call to provider office, please advise to call [place]  at [place], [place], [place], [place] [address] Direct Dial: [phone] [email or web address] Website: [email or web address]

## 2020-09-01 NOTE — Chronic Care Management (AMB) (Signed)
  Care Management   Note  09/01/2020 Name: Kristin [name] MRN: [id number] DOB: 1941-05-27  Kristin [name] is a 80 y.o. year old female who is a primary care patient of [name], [name], [place] and is actively engaged with the care management team. I reached out to Kristin [name] by phone today to assist with re-scheduling a follow up visit with the Pharmacist  Follow up plan: Telephone appointment with care management team member scheduled for: 09/13/2020 @ 9:30am   Kristin [name], [address], [place], [place] [address] Direct Dial: [phone] [email or web address] Website: [email or web address]

## 2020-09-06 ENCOUNTER — Other Ambulatory Visit: Payer: Self-pay | Admitting: Family Medicine

## 2020-09-06 DIAGNOSIS — R42 Dizziness and giddiness: Secondary | ICD-10-CM

## 2020-09-06 NOTE — Telephone Encounter (Signed)
Requested medication (s) are due for refill today: no  Requested medication (s) are on the active medication list: yes  Last refill:  06/07/2020  Future visit scheduled:  no  Notes to clinic:  this refill cannot be delegated   Requested Prescriptions  Pending Prescriptions Disp Refills   meclizine (ANTIVERT) 12.5 MG tablet [Pharmacy Med Name: MECLIZINE HCL 12.5 MG TAB] 270 tablet 1    Sig: TAKE 1 TABLET BY MOUTH THREE TIMES [place]      Not Delegated - Gastroenterology: Antiemetics Failed - 09/06/2020  3:10 PM      Failed - This refill cannot be delegated      Passed - Valid encounter within last 6 months    Recent Outpatient Visits           1 month ago [place], [name], FNP   1 month ago [facility], [name], FNP   4 months ago Essential hypertension, benign   [facility], [name], FNP   4 months ago Essential hypertension, benign   [facility], [name], FNP   5 months ago Essential hypertension, benign   [facility] [name], [place]

## 2020-09-13 ENCOUNTER — Telehealth: Payer: PPO

## 2020-09-13 DIAGNOSIS — Z872 Personal history of diseases of the skin and subcutaneous tissue: Secondary | ICD-10-CM | POA: Diagnosis not present

## 2020-09-13 DIAGNOSIS — M2550 Pain in unspecified joint: Secondary | ICD-10-CM | POA: Diagnosis not present

## 2020-09-13 DIAGNOSIS — M539 Dorsopathy, unspecified: Secondary | ICD-10-CM | POA: Diagnosis not present

## 2020-09-13 DIAGNOSIS — M8949 Other hypertrophic osteoarthropathy, multiple sites: Secondary | ICD-10-CM | POA: Diagnosis not present

## 2020-09-22 ENCOUNTER — Telehealth: Payer: PPO

## 2020-09-22 ENCOUNTER — Telehealth: Payer: Self-pay | Admitting: Pharmacist

## 2020-09-22 NOTE — Telephone Encounter (Signed)
  Chronic Care Management   Outreach Note  09/22/2020 Name: PAISLYN DOMENICO MRN: [id number] DOB: 1940-11-01  Referred by: Verl Bangs, FNP Reason for referral : No chief complaint on file.   Was unable to reach patient or spouse via telephone today and have left HIPAA compliant voicemail asking patient to return my call.   Follow Up Plan: Will collaborate with Care Guide to outreach to schedule follow up with me  Harlow Asa, PharmD, [place] Management [phone]

## 2020-09-23 ENCOUNTER — Telehealth: Payer: Self-pay | Admitting: General Practice

## 2020-09-23 ENCOUNTER — Telehealth: Payer: PPO

## 2020-09-23 NOTE — Telephone Encounter (Signed)
  Chronic Care Management   Outreach Note  09/23/2020 Name: Kristin [name] MRN: [id number] DOB: 05/02/1941  Referred by: Verl Bangs, FNP Reason for referral : Appointment (RNCM: Follow up call for Chronic Disease Management and Care Coordination Needs- 2nd attempt)   A second unsuccessful telephone outreach was attempted today. The patient was referred to the case management team for assistance with care management and care coordination. The patient answered the phone but states that [name] is not home and she is forgetful. Ask for a call at another time.      Follow Up Plan: The care management team will reach out to the patient again over the next 30 to 60 days.   Noreene Larsson RN, MSN, [place] Mobile: [phone]

## 2020-09-24 NOTE — Telephone Encounter (Signed)
Patient has been rescheduled.

## 2020-09-29 ENCOUNTER — Ambulatory Visit (INDEPENDENT_AMBULATORY_CARE_PROVIDER_SITE_OTHER): Payer: PPO | Admitting: Pharmacist

## 2020-09-29 DIAGNOSIS — I10 Essential (primary) hypertension: Secondary | ICD-10-CM | POA: Diagnosis not present

## 2020-09-29 NOTE — Chronic Care Management (AMB) (Signed)
Chronic Care Management Pharmacy Note  09/29/2020 Name:  Kristin [name] MRN:  [id number] DOB:  [birth year]  Subjective: Kristin [name] is an 80 y.o. year old female who is a primary patient of [name], [name], FNP.  The CCM team was consulted for assistance with disease management and care coordination needs.    Engaged with patient and husband by telephone for follow up visit in response to provider referral for pharmacy case management and/or care coordination services.   Consent to Services:  The patient was given information about Chronic Care Management services, agreed to services, and gave verbal consent prior to initiation of services.  Please see initial visit note for detailed documentation.   Objective:  Lab Results  Component Value Date   CREATININE 1.21 (H) 11/03/2019   CREATININE 0.88 06/14/2018   CREATININE 0.85 02/21/2018    BP Readings from Last 3 Encounters:  07/12/20 (!) 123/45  06/02/20 139/62  04/12/20 (!) 142/55    Assessment: Review of patient past medical history, allergies, medications, health status, including review of consultants reports, laboratory and other test data, was performed as part of comprehensive evaluation and provision of chronic care management services.   SDOH:  (Social Determinants of Health) assessments and interventions performed: none   CCM Care Plan  Allergies  Allergen Reactions  . Angiotensin Receptor Blockers Anaphylaxis    Fatigue & Dizziness  . Other Anaphylaxis    Other reaction(s): Other (See Comments) Dizziness, rash Fatigue & Dizziness  . Hydralazine Other (See Comments)    Unknown  . Latex     REACTION: Rash, blisters, breathing probs.  . Losartan     Dizziness   . Methotrexate Derivatives     Dizziness, rash   . Spironolactone     Dizziness   . Penicillins Hives and Rash    Medications Reviewed Today    Reviewed by Verl Bangs, FNP (Family Nurse Practitioner) on 07/26/20 at 1326  Med List  Status: <None>  Medication Order Taking? Sig Documenting Provider Last Dose Status Informant  acetaminophen (TYLENOL) 325 MG tablet [id number] Yes Take 325 mg by mouth every 8 (eight) hours as needed (pain). [provider] Taking Active Self  ASPIRIN 81 PO [id number] Yes Take 1 tablet by mouth daily. [provider] Taking Active Self  betamethasone dipropionate (DIPROLENE) 0.05 % ointment [id number] Yes APPLY TO AFFECTED AREAS TWICE DAILY AS NEEDED [provider] Taking Active   Calcium Carbonate-Vitamin D (CALTRATE 600+D PO) [id number] Yes Take 1 tablet by mouth daily.  [provider] Taking Active Self  Cranberry 400 MG TABS [id number] Yes Take 1 tablet by mouth daily. [provider] Taking Active   diltiazem (CARDIZEM CD) 240 MG 24 hr capsule [id number] Yes Take 1 capsule (240 mg total) by mouth daily. Loel Dubonnet, NP Taking Active   donepezil (ARICEPT) 5 MG tablet [id number] Yes TAKE ONE TABLET AT BEDTIME [place], [name], FNP Taking Active   Lactobacillus Rhamnosus, GG, (CULTURELLE PO) [id number] Yes Take 1 capsule by mouth daily. [provider] Taking Active   meclizine (ANTIVERT) 12.5 MG tablet [id number] Yes Take 1 tablet (12.5 mg total) by mouth 3 (three) times daily as needed for dizziness. Verl Bangs, FNP Taking Active   memantine (NAMENDA) 10 MG tablet [id number] Yes TAKE 1 TABLET BY MOUTH TWICE DAILY [place], [name], FNP Taking Active   Multiple Vitamin (MULTIVITAMIN) tablet [id number] Yes Take 1 tablet by mouth daily. [provider] Taking Active Self  nitroGLYCERIN (NITROSTAT) 0.4 MG SL tablet [id number] Yes Place 1 tablet (0.4 mg total) under the tongue every 5 (five) minutes as needed for chest pain. Loel Dubonnet, NP Taking Active   Omega-3 Fatty Acids (FISH OIL) 1200 MG CAPS [id number] Yes Take 1 capsule by mouth daily.  [provider] Taking Active Self  Polyethyl Glycol-Propyl Glycol 0.4-0.3 % SOLN  [id number] Yes Apply 1 drop to eye daily as needed (dry eyes). [provider] Taking Active Self  Red Yeast Rice 600 MG CAPS [id number] Yes Take 2 capsules by mouth daily. [provider] Taking Active   traMADol (ULTRAM) 50 MG tablet [id number] Yes TAKE ONE TABLET (50 MG) BY MOUTH EVERY 12 HOURS AS NEEDED FOR MODERATE PAIN Mikey [employer], NP Taking Active           Patient Active Problem List   Diagnosis Date Noted  . Dysuria 04/12/2020  . Encounter for screening mammogram for malignant neoplasm of breast 03/15/2020  . Porokeratosis 09/18/2019  . Rheumatoid arthritis ([place]) 07/27/2017  . 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 Counseling regarding end of life decision making 01/14/2015  . Oral candidiasis 10/27/2014  . Short-term memory loss 10/17/2013  . Cerebral vascular disease 01/01/2013  . 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[place])   . PVC's (premature ventricular contractions) 06/03/2012  . Post herpetic neuralgia 10/11/2011  . Osteoarthritis 08/10/2011  . Chronic fatigue 08/10/2011  . Dizziness 08/10/2011  . MYOCARDIAL INFARCTION, HX OF 08/01/2010  . 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 Fibromyalgia 05/20/2008  . Hyperlipidemia 05/29/2007  . OBSTRUCTIVE SLEEP APNEA 05/29/2007  . Essential hypertension, benign 05/29/2007  . ALLERGIC RHINITIS 05/29/2007  . ACNE, ROSACEA 05/29/2007    Conditions to be addressed/monitored: HTN and memory loss  Care Plan : PharmD - Medication Mgmt  Updates made by Vella Raring, [place] since 09/29/2020 12:00 AM    Problem: Disease Progression     Long-Range Goal: Disease Progression Prevented or Minimized   Start Date: 09/29/2020  Expected End Date: 12/28/2020  This Visit's Progress: On track  Priority: High  Note:   Current Barriers:  . Chronic Disease Management support, education, and care  coordination needs related to HTN, [name], PVC, MI in 1996 thought secondary to coronary spasm (per Cardiology notes) and memory loss  Pharmacist Clinical Goal(s):  [name] Over the next 90 days, patient will achieve adherence to monitoring guidelines and medication adherence to achieve therapeutic efficacy through collaboration with PharmD and provider.   Interventions: . 1:1 collaboration with Olin Hauser, DO regarding development and update of comprehensive plan of care as evidenced by provider attestation and co-signature . Inter-disciplinary care team collaboration (see longitudinal plan of care) . Perform chart review.  o Note patient seen by PCP for Office Visit on 11/29 and Telemedicine Visit on 12/13 regarding dizziness. - On 11/29 patient advised to stop HCTZ to see if improved dizziness, but no improvement noted on 12/13 - Provider placed referral to Neurology . Scheduled with [facility] for 3/22 - Provider also placed referral to Rheumatology o Patient seen by Dr. Posey Pronto at [facility] Rheumatology on 1/31. Provider advised patient to: - Start Cymbalta 20 mg daily for osteoarthritis pain control - Follow up with Physiatry for possible cortisone injection o Appointment scheduled with Physical Medicine and Rehabilitation on 2/23 . Today patient reports feel like pain has improved some since started on Cymbalta   Hypertension .  Current treatment: diltiazem CD 240 mg once daily o Confirms no longer taking HCTZ as discontinued by PCP . Reports latest home BP readings: o 2/16: 119/66, HR 70 o 1/28: 140/64, HR 71 . Encourage patient and caregiver to check BP 1-2 times/week at home, keep log, and bring to medical appointments  Patient Goals/Self-Care Activities . Over the next 90 days, patient/spouse will:  - check blood pressure, document, and provide at future appointments - attend all scheduled provider appointments with help of spouse  Follow Up Plan:  Telephone follow up appointment with care management team member scheduled for: 4/4 at 1:15 pm      Follow Up:  Patient agrees to Care Plan and Follow-up.  Harlow Asa, PharmD, [place] [phone]

## 2020-09-29 NOTE — Patient Instructions (Signed)
Visit Information  PATIENT GOALS: Goals Addressed            This Visit's Progress   . Pharmacy goals       Please check blood pressure at home 1-2 times/week, keep log, and bring to medical appointments  Feel free to call me with any questions or concerns. I look forward to our next call!       The patient verbalized understanding of instructions, educational materials, and care plan provided today and declined offer to receive copy of patient instructions, educational materials, and care plan.   Telephone follow up appointment with care management team member scheduled for: 4/4 at 1:15 pm  Harlow Asa, PharmD, [place] [phone]

## 2020-09-30 ENCOUNTER — Encounter: Payer: Self-pay | Admitting: Podiatry

## 2020-09-30 ENCOUNTER — Other Ambulatory Visit: Payer: Self-pay

## 2020-09-30 ENCOUNTER — Ambulatory Visit: Payer: PPO | Admitting: Podiatry

## 2020-09-30 DIAGNOSIS — M2042 Other hammer toe(s) (acquired), left foot: Secondary | ICD-10-CM | POA: Diagnosis not present

## 2020-09-30 DIAGNOSIS — M205X9 Other deformities of toe(s) (acquired), unspecified foot: Secondary | ICD-10-CM | POA: Diagnosis not present

## 2020-09-30 DIAGNOSIS — B351 Tinea unguium: Secondary | ICD-10-CM

## 2020-09-30 DIAGNOSIS — M79609 Pain in unspecified limb: Secondary | ICD-10-CM | POA: Diagnosis not present

## 2020-09-30 DIAGNOSIS — M0579 Rheumatoid arthritis with rheumatoid factor of multiple sites without organ or systems involvement: Secondary | ICD-10-CM

## 2020-09-30 DIAGNOSIS — M2041 Other hammer toe(s) (acquired), right foot: Secondary | ICD-10-CM

## 2020-09-30 NOTE — Progress Notes (Signed)
This patient returns to my office for at risk foot care.  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  This patient is unable to cut nails herself since the patient cannot reach her nails.These nails are painful walking and wearing shoes.  This patient presents for at risk foot care today.  General Appearance  Alert, conversant and in no acute stress.  Vascular  Dorsalis pedis and posterior tibial  pulses are palpable  bilaterally.  Capillary return is within normal limits  bilaterally. Temperature is within normal limits  bilaterally.  Neurologic  Senn-Weinstein monofilament wire test within normal limits  bilaterally. Muscle power within normal limits bilaterally.  Nails Thick disfigured discolored nails with subungual debris  from hallux to fifth toes bilaterally. No evidence of bacterial infection or drainage bilaterally.  Orthopedic  No limitations of motion  feet .  No crepitus or effusions noted.  No bony pathology or digital deformities noted. 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  Hammer toes second  B/L.  Skin  normotropic skin with no porokeratosis noted bilaterally.  No signs of infections or ulcers noted.     Onychomycosis  Pain in right toes  Pain in left toes  Consent was obtained for treatment procedures.   Mechanical debridement of nails 1-5  bilaterally performed with a nail nipper.  Filed with dremel without incident.  Padding second toe  B/L.   Return office visit   3 months                   Told patient to return for periodic foot care and evaluation due to potential at risk complications.   Gardiner Barefoot DPM

## 2020-10-05 ENCOUNTER — Other Ambulatory Visit: Payer: Self-pay

## 2020-10-05 DIAGNOSIS — R413 Other amnesia: Secondary | ICD-10-CM

## 2020-10-05 MED ORDER — MEMANTINE HCL 10 MG PO TABS: 10.0000 mg | ORAL_TABLET | Freq: Two times a day (BID) | ORAL | 0 refills | Status: DC

## 2020-10-06 ENCOUNTER — Other Ambulatory Visit: Payer: Self-pay | Admitting: Physical Medicine & Rehabilitation

## 2020-10-06 DIAGNOSIS — M5442 Lumbago with sciatica, left side: Secondary | ICD-10-CM

## 2020-10-06 DIAGNOSIS — G8929 Other chronic pain: Secondary | ICD-10-CM | POA: Diagnosis not present

## 2020-10-06 DIAGNOSIS — M5441 Lumbago with sciatica, right side: Secondary | ICD-10-CM | POA: Diagnosis not present

## 2020-10-20 ENCOUNTER — Ambulatory Visit
Admission: RE | Admit: 2020-10-20 | Discharge: 2020-10-20 | Disposition: A | Discharge status: Home / Self Care | Payer: PPO | Source: Ambulatory Visit | Attending: Physical Medicine & Rehabilitation | Admitting: Physical Medicine & Rehabilitation

## 2020-10-20 ENCOUNTER — Encounter: Payer: Self-pay | Admitting: Physician Assistant

## 2020-10-20 ENCOUNTER — Other Ambulatory Visit: Payer: Self-pay

## 2020-10-20 DIAGNOSIS — G8929 Other chronic pain: Secondary | ICD-10-CM | POA: Diagnosis not present

## 2020-10-20 DIAGNOSIS — M5442 Lumbago with sciatica, left side: Secondary | ICD-10-CM | POA: Insufficient documentation

## 2020-10-20 DIAGNOSIS — M545 Low back pain, unspecified: Secondary | ICD-10-CM | POA: Diagnosis not present

## 2020-10-27 DIAGNOSIS — M48062 Spinal stenosis, lumbar region with neurogenic claudication: Secondary | ICD-10-CM | POA: Diagnosis not present

## 2020-10-27 DIAGNOSIS — M5441 Lumbago with sciatica, right side: Secondary | ICD-10-CM | POA: Diagnosis not present

## 2020-10-27 DIAGNOSIS — G8929 Other chronic pain: Secondary | ICD-10-CM | POA: Diagnosis not present

## 2020-10-27 DIAGNOSIS — M5442 Lumbago with sciatica, left side: Secondary | ICD-10-CM | POA: Diagnosis not present

## 2020-10-28 ENCOUNTER — Telehealth: Payer: PPO

## 2020-11-02 DIAGNOSIS — M545 Low back pain, unspecified: Secondary | ICD-10-CM | POA: Diagnosis not present

## 2020-11-02 DIAGNOSIS — M5442 Lumbago with sciatica, left side: Secondary | ICD-10-CM | POA: Diagnosis not present

## 2020-11-02 DIAGNOSIS — R262 Difficulty in walking, not elsewhere classified: Secondary | ICD-10-CM | POA: Diagnosis not present

## 2020-11-02 DIAGNOSIS — M48062 Spinal stenosis, lumbar region with neurogenic claudication: Secondary | ICD-10-CM | POA: Diagnosis not present

## 2020-11-02 DIAGNOSIS — M5441 Lumbago with sciatica, right side: Secondary | ICD-10-CM | POA: Diagnosis not present

## 2020-11-02 DIAGNOSIS — E538 Deficiency of other specified B group vitamins: Secondary | ICD-10-CM | POA: Diagnosis not present

## 2020-11-02 DIAGNOSIS — M48061 Spinal stenosis, lumbar region without neurogenic claudication: Secondary | ICD-10-CM | POA: Diagnosis not present

## 2020-11-02 DIAGNOSIS — R42 Dizziness and giddiness: Secondary | ICD-10-CM | POA: Diagnosis not present

## 2020-11-02 DIAGNOSIS — E038 Other specified hypothyroidism: Secondary | ICD-10-CM | POA: Diagnosis not present

## 2020-11-02 DIAGNOSIS — R413 Other amnesia: Secondary | ICD-10-CM | POA: Diagnosis not present

## 2020-11-02 DIAGNOSIS — R2681 Unsteadiness on feet: Secondary | ICD-10-CM | POA: Diagnosis not present

## 2020-11-02 DIAGNOSIS — G8929 Other chronic pain: Secondary | ICD-10-CM | POA: Diagnosis not present

## 2020-11-02 DIAGNOSIS — R399 Unspecified symptoms and signs involving the genitourinary system: Secondary | ICD-10-CM | POA: Diagnosis not present

## 2020-11-02 DIAGNOSIS — G479 Sleep disorder, unspecified: Secondary | ICD-10-CM | POA: Diagnosis not present

## 2020-11-03 DIAGNOSIS — R399 Unspecified symptoms and signs involving the genitourinary system: Secondary | ICD-10-CM | POA: Diagnosis not present

## 2020-11-03 DIAGNOSIS — R2681 Unsteadiness on feet: Secondary | ICD-10-CM | POA: Diagnosis not present

## 2020-11-03 DIAGNOSIS — M545 Low back pain, unspecified: Secondary | ICD-10-CM | POA: Diagnosis not present

## 2020-11-03 DIAGNOSIS — R262 Difficulty in walking, not elsewhere classified: Secondary | ICD-10-CM | POA: Diagnosis not present

## 2020-11-03 DIAGNOSIS — E038 Other specified hypothyroidism: Secondary | ICD-10-CM | POA: Diagnosis not present

## 2020-11-03 DIAGNOSIS — M48061 Spinal stenosis, lumbar region without neurogenic claudication: Secondary | ICD-10-CM | POA: Diagnosis not present

## 2020-11-03 DIAGNOSIS — R413 Other amnesia: Secondary | ICD-10-CM | POA: Diagnosis not present

## 2020-11-03 DIAGNOSIS — G479 Sleep disorder, unspecified: Secondary | ICD-10-CM | POA: Diagnosis not present

## 2020-11-03 DIAGNOSIS — E538 Deficiency of other specified B group vitamins: Secondary | ICD-10-CM | POA: Diagnosis not present

## 2020-11-03 DIAGNOSIS — R42 Dizziness and giddiness: Secondary | ICD-10-CM | POA: Diagnosis not present

## 2020-11-03 DIAGNOSIS — G8929 Other chronic pain: Secondary | ICD-10-CM | POA: Diagnosis not present

## 2020-11-08 ENCOUNTER — Other Ambulatory Visit: Payer: Self-pay | Admitting: Cardiovascular Disease

## 2020-11-08 DIAGNOSIS — I493 Ventricular premature depolarization: Secondary | ICD-10-CM

## 2020-11-08 NOTE — Telephone Encounter (Signed)
Rx request sent to pharmacy.  

## 2020-11-08 NOTE — Telephone Encounter (Signed)
Please call pt to schedule a follow-up appointment for refills. Last seen in 2020.

## 2020-11-08 NOTE — Telephone Encounter (Signed)
Scheduled 4.18

## 2020-11-15 ENCOUNTER — Telehealth: Payer: Self-pay | Admitting: *Deleted

## 2020-11-15 ENCOUNTER — Telehealth: Payer: PPO

## 2020-11-15 NOTE — Chronic Care Management (AMB) (Signed)
  Care Management   Note  11/15/2020 Name: Kristin [name] MRN: [id number] DOB: 26-Oct-1940  [name] is a 80 y.o. year old female who is a primary care patient of [name], [name], [place] and is actively engaged with the care management team. I reached out to [name] by phone today to assist with re-scheduling a follow up visit with the Pharmacist  Follow up plan: Telephone appointment with care management team member scheduled for:12/06/2020  Janari Yamada  Care Guide, Embedded Care Coordination [place]  Care Management

## 2020-11-15 NOTE — Chronic Care Management (AMB) (Signed)
  Care Management   Note  11/15/2020 Name: SAN RUA MRN: [id number] DOB: [birth year]  ANABELLE BUNGERT is a 80 y.o. year old female who is a primary care patient of [name], [name], [place] and is actively engaged with the care management team. I reached out to [name] by phone today to assist with re-scheduling a follow up visit with the Pharmacist  Follow up plan: Unsuccessful telephone outreach attempt made. The care management team will reach out to the patient again over the next 7 days. If patient returns call to provider office, please advise to call [place] at [phone].  [place] Management

## 2020-11-16 DIAGNOSIS — M5442 Lumbago with sciatica, left side: Secondary | ICD-10-CM | POA: Diagnosis not present

## 2020-11-16 DIAGNOSIS — G8929 Other chronic pain: Secondary | ICD-10-CM | POA: Diagnosis not present

## 2020-11-16 DIAGNOSIS — M48062 Spinal stenosis, lumbar region with neurogenic claudication: Secondary | ICD-10-CM | POA: Diagnosis not present

## 2020-11-16 DIAGNOSIS — M5441 Lumbago with sciatica, right side: Secondary | ICD-10-CM | POA: Diagnosis not present

## 2020-11-22 ENCOUNTER — Telehealth: Payer: PPO

## 2020-11-22 ENCOUNTER — Telehealth: Payer: Self-pay | Admitting: General Practice

## 2020-11-22 DIAGNOSIS — M48062 Spinal stenosis, lumbar region with neurogenic claudication: Secondary | ICD-10-CM | POA: Diagnosis not present

## 2020-11-22 DIAGNOSIS — G8929 Other chronic pain: Secondary | ICD-10-CM | POA: Diagnosis not present

## 2020-11-22 DIAGNOSIS — M5442 Lumbago with sciatica, left side: Secondary | ICD-10-CM | POA: Diagnosis not present

## 2020-11-22 DIAGNOSIS — M5441 Lumbago with sciatica, right side: Secondary | ICD-10-CM | POA: Diagnosis not present

## 2020-11-22 NOTE — Telephone Encounter (Signed)
  Chronic Care Management   Outreach Note  11/22/2020 Name: Kristin [name] MRN: [id number] DOB: [birth year]  Referred by: Verl Bangs, FNP Reason for referral : Appointment (RNCM: Follow up for Chronic Disease Management and Care Coordination Needs)   An unsuccessful telephone outreach was attempted today. The patient was referred to the case management team for assistance with care management and care coordination.   Follow Up Plan: The care management team will reach out to the patient again over the next 30 days.   Noreene Larsson RN, MSN, [place] Mobile: [phone]

## 2020-11-23 NOTE — Telephone Encounter (Signed)
Please reschedule F/U RN CM at [facility]

## 2020-11-29 ENCOUNTER — Other Ambulatory Visit: Payer: Self-pay

## 2020-11-29 ENCOUNTER — Ambulatory Visit: Payer: PPO | Admitting: Family

## 2020-11-29 ENCOUNTER — Encounter: Payer: Self-pay | Admitting: Family

## 2020-11-29 VITALS — BP 140/64 | HR 61 | Ht 67.0 in | Wt 128.0 lb

## 2020-11-29 DIAGNOSIS — I10 Essential (primary) hypertension: Secondary | ICD-10-CM | POA: Diagnosis not present

## 2020-11-29 DIAGNOSIS — I25111 Atherosclerotic heart disease of native coronary artery with angina pectoris with documented spasm: Secondary | ICD-10-CM | POA: Diagnosis not present

## 2020-11-29 DIAGNOSIS — R600 Localized edema: Secondary | ICD-10-CM | POA: Diagnosis not present

## 2020-11-29 DIAGNOSIS — I493 Ventricular premature depolarization: Secondary | ICD-10-CM

## 2020-11-29 MED ORDER — DILTIAZEM HCL ER COATED BEADS 240 MG PO CP24: 240.0000 mg | ORAL_CAPSULE | Freq: Every day | ORAL | 1 refills | Status: DC

## 2020-11-29 MED ORDER — HYDROCHLOROTHIAZIDE 12.5 MG PO CAPS: 12.5000 mg | ORAL_CAPSULE | Freq: Every day | ORAL | 5 refills | Status: DC

## 2020-11-29 NOTE — Progress Notes (Signed)
Office Visit    Patient Name: Kristin [name] Date of Encounter: 11/29/2020  PCP:  Verl Bangs, [place]  Cardiologist:  Kathlyn Sacramento, MD  Advanced Practice Provider:  No care team member to display Electrophysiologist:  None    Chief Complaint    Kristin [name] is a 80 y.o. female with a hx of HTN, PVC, inflammatory arthritis/osteoarthritis and chronic pain, OSA not on CPAP,  [name], MI in 1996 thought secondary to coronary spasm presents today for follow up of coronary artery disease.   Past Medical History    Past Medical History:  Diagnosis Date  . Allergy   . Arthritis    recent falls -aug 2010  . [name] (coronary artery disease)    mi-1996  . Complication of anesthesia    pt states takes a long time to wake her wake up  . 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 ([name]) 08/27/2015  . Cough    no fever   . Degeneration of lumbar or lumbosacral intervertebral disc   . Dizziness    r/t meds  . DVT (deep venous thrombosis) ([place])   . Fibromyalgia   . History of shingles   . [name] (irritable bowel syndrome)    takes OTC [place] Negus colon health  . Joint pain   . Joint swelling   . Myalgia and myositis, unspecified   . Obstructive sleep apnea (adult) (pediatric)   . Old myocardial infarction 1996  . Osteoarthritis   . Osteoarthrosis, unspecified whether generalized or localized, unspecified site   . Psoriatic arthritis ([place])   . Rheumatoid arthritis ([place])   . Rosacea   . Seasonal allergies   . Stroke [facility])    ischaemic microvascular disease  . 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 Unspecified essential hypertension    takes Diltiazem,Metoprolol,and Losartan daily  . UTI (lower urinary tract infection)    frequent=last one was winter of 2014   Past Surgical History:  Procedure Laterality Date  . ATHERECTOMY  1996  .  [place]   vasospams; no blockage  . CARDIAC CATHETERIZATION  2013   No obstructive coronary artery disease. Mild distal LAD and proximal RCA disease.  [name] CATARACT EXTRACTION Bilateral   . COLONOSCOPY    . CT ABD WO/W & PELVIS WO CM  2013   no acute process, + minimal diverticulosis and hepatic/renal cysts, ATH calcifications  . ETHMOIDECTOMY  1998  . 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 NASAL SINUS SURGERY  1998  . OOPHORECTOMY     BSO  . sinus biopsy  1998   inverted papiloma  . VAGINAL HYSTERECTOMY  1992   for mennorhagia-LAVH BSO    Allergies  Allergies  Allergen Reactions  . Angiotensin Receptor Blockers Anaphylaxis    Fatigue & Dizziness  . Other Anaphylaxis    Other reaction(s): Other (See Comments) Dizziness, rash Fatigue & Dizziness  . Hydralazine Other (See Comments)    Unknown  . Latex     REACTION: Rash, blisters, breathing probs.  . Losartan     Dizziness   . Methotrexate Derivatives     Dizziness, rash   . Spironolactone  Dizziness   . Penicillins Hives and Rash    History of Present Illness    Kristin [name] is a 80 y.o. female with a hx of HTN, PVC, inflammatory arthritis/osteoarthritis and chronic pain, OSA not on CPAP,  [name], MI in 1996 thought secondary to coronary spasm last seen 07/2019.  She is a retired Therapist, sports.  She had a cardiac catheterization 05/2012 with no significant [name], mild distal LAD and RCA disease.  Known palpitations due to PVCs and ventricular bigeminy.  Known history of refractory hypertension with negative work-up for secondary hypertension.  She has had difficulty with volume depletion and hypotension on antihypertensive therapies.  Previously on CPAP for OSA but reports she could not tolerate CPAP.   She was started on Donazepil by her PCP in October  2020 for worsening short term memory loss.   When last seen 07/2019 she noted dyspnea on exertion over the previous year and lower extremity edema. Subsequent echocardiogram 08/29/19 with LVEF 60-65%, mild LVH, gr1DD and no significant valvular abnormality.  In November her primary care provider discontinued HCTZ to see if it would improve her dizziness. Meclinizine was not of much help. She additionally follows with neurology and is on Aricept and Namenda. She had a back injection 10/2020 due to multilevel degenerative disc disease. She is planning to have repeat epidural injection.   She presents today with her husband who assists with history. She seems very aware of where her memory limitations are and often asks her husband to confirm what she is saying. Chief complaint of bilateral ankle edema despite elevation.  She tried TED hose but tells me they were too restrictive.  Follows a low-salt diet and drinks water and hot tea throughout the day but total less than 2 L/day.  No orthopnea, PND.  Reports BP at home is generally 120/70s -  140/80s.  Reports no chest pain, pressure, tightness, palpitations, shortness of breath.  EKGs/Labs/Other Studies Reviewed:   The following studies were reviewed today:  Echo 08/29/19  1. Left ventricular ejection fraction, by visual estimation, is 60 to  65%. The left ventricle has normal function. There is mildly increased  left ventricular hypertrophy.   2. Left ventricular diastolic parameters are consistent with Grade I  diastolic dysfunction (impaired relaxation).   3. The left ventricle has no regional wall motion abnormalities.   4. Global right ventricle has normal systolic function.The right  ventricular size is normal. No increase in right ventricular wall  thickness.   5. Left atrial size was normal.   6. Mildly elevated pulmonary artery systolic pressure.   7. The tricuspid regurgitant velocity is 2.75 m/s, and with an assumed  right atrial  pressure of 5 mmHg, the estimated right ventricular systolic  pressure is mildly elevated at 35.2 mmHg.    EKG:  EKG is  ordered today.  The ekg ordered today demonstrates SR 61 bpm with no acute ST/T wave changes and minimal voltage criteria for LVH (known mild LVH by echo 08/29/19)  Recent Labs: No results found for requested labs within last 8760 hours.  Recent Lipid Panel    Component Value Date/Time   CHOL 227 (H) 11/03/2019 0941   TRIG 134 11/03/2019 0941   HDL 65 11/03/2019 0941   CHOLHDL 3.5 11/03/2019 0941   VLDL 21.2 02/21/2018 1035   LDLCALC 136 (H) 11/03/2019 0941   LDLDIRECT 158.1 10/02/2013 0914     Home Medications   Current Meds  Medication Sig  . acetaminophen (TYLENOL) 325  MG tablet Take 325 mg by mouth every 8 (eight) hours as needed (pain).  [name] amLODipine (NORVASC) 5 MG tablet Take 1 tablet by mouth daily.  . ASPIRIN 81 PO Take 1 tablet by mouth daily.  . betamethasone dipropionate (DIPROLENE) 0.05 % ointment APPLY TO AFFECTED AREAS TWICE DAILY AS NEEDED  . Calcium Carb-Cholecalciferol 600-800 MG-UNIT CHEW Chew 1 tablet by mouth daily.  . Calcium Carbonate-Vitamin D (CALTRATE 600+D PO) Take 1 tablet by mouth daily.   . Cranberry 400 MG TABS Take 1 tablet by mouth daily.  [name] CRANBERRY PO Take 1 tablet by mouth daily.  [name] diltiazem (CARDIZEM CD) 240 MG 24 hr capsule Take 1 capsule (240 mg total) by mouth daily. Please keep your upcoming appointment for 90 day refills.  [name] diltiazem (TIAZAC) 240 MG 24 hr capsule Take by mouth.  . donepezil (ARICEPT) 10 MG tablet Take by mouth.  . DULoxetine (CYMBALTA) 20 MG capsule Take 20 mg by mouth daily.  . hydrochlorothiazide (MICROZIDE) 12.5 MG capsule Take 1 capsule (12.5 mg total) by mouth daily.  . Lactobacillus Rhamnosus, GG, (CULTURELLE PO) Take 1 capsule by mouth daily.  . meclizine (ANTIVERT) 12.5 MG tablet TAKE 1 TABLET BY MOUTH THREE TIMES DAILYAS NEEDED FOR DIZZINESS  . memantine (NAMENDA) 10 MG tablet Take 1 tablet  (10 mg total) by mouth 2 (two) times daily.  . Multiple Vitamin (MULTIVITAMIN) tablet Take 1 tablet by mouth daily.  . nitroGLYCERIN (NITROSTAT) 0.4 MG SL tablet Place 1 tablet (0.4 mg total) under the tongue every 5 (five) minutes as needed for chest pain.  . Omega-3 Fatty Acids (FISH OIL) 1200 MG CAPS Take 1 capsule by mouth daily.   Vladimir Faster Glycol-Propyl Glycol 0.4-0.3 % SOLN Apply 1 drop to eye daily as needed (dry eyes).  . Probiotic Product (MISC INTESTINAL FLORA REGULAT) CAPS Take by mouth.  . Red Yeast Rice 600 MG CAPS Take 2 capsules by mouth daily.  . traMADol (ULTRAM) 50 MG tablet TAKE ONE TABLET (50 MG) BY MOUTH EVERY 12 HOURS AS NEEDED FOR MODERATE PAIN     Review of Systems  All other systems reviewed and are otherwise negative except as noted above.  Physical Exam    VS:  BP 140/64 (BP Location: Left Arm, Patient Position: Sitting, Cuff Size: Normal)   Pulse 61   Ht 5\' 7"  (1.702 m)   Wt 128 lb (58.1 kg)   LMP 08/14/1989   SpO2 95%   BMI 20.05 kg/m  , BMI Body mass index is 20.05 kg/m.  Wt Readings from Last 3 Encounters:  11/29/20 128 lb (58.1 kg)  07/12/20 127 lb 12.8 oz (58 kg)  04/12/20 127 lb 12.8 oz (58 kg)    GEN: Well nourished, well developed, in no acute distress. HEENT: normal. Neck: Supple, no JVD, carotid bruits, or masses. Cardiac: RRR, no murmurs, rubs, or gallops. No clubbing, cyanosis. Bilateral lower extremity 1+ pitting edema..  Radials/PT 2+ and equal bilaterally.  Respiratory:  Respirations regular and unlabored, clear to auscultation bilaterally. GI: Soft, nontender, nondistended. MS: No deformity or atrophy.  Skin: Warm and dry, no rash.  Varicose veins of bilateral lower extremity. Neuro:  Strength and sensation are intact. Psych: Normal affect.  Assessment & Plan    1. Lower extremity edema -likely etiology venous insufficiency.  Echo 0/3491 with gr1 diastolic dysfunction. In the absence of worsening dyspnea, no orthopnea/PND will  defer repeat echo. Increased in severity since discontinuation of hydrochlorothiazide.  No change in dizziness since  discontinued dose of hydrochlorothiazide.  Resume hydrochlorothiazide 12.5 mg daily.  Follow-up with neurology regarding dizziness.  Encourage slow position changes. No evidence of orthostasis today and symptoms of dizziness are not suggestive of cardiac etiology. Recommend low salt diet and compression stockings.   2. [name] - Cardiac cath 2013 mild nonobstructive disease.  No anginal symptoms.  EKG today no acute ST/T wave changes.  GDMT includes aspirin, PRN nitroglycerin.   3. PVC -none noted by EKG today.  Denies palpitations.  No indication for treatment at this time.  Continue diltiazem 240 mg daily. Refill provided.  4. HTN -BP mildly elevated.  Continue amlodipine 5 mg daily, diltiazem 240 mg daily. Start  HCTZ 12.5mg  daily.  Noted after visit that Amlodipine and Diltiazem were on med list, will ask triage team to contact her to discontinue Amlodipine if she is taking.   Disposition: Follow up in 6 month(s) with Dr. Fletcher Anon or APP   Signed, Loel Dubonnet, NP 11/29/2020, 4:27 PM [place]

## 2020-11-29 NOTE — Patient Instructions (Addendum)
Medication Instructions:  Your physician has recommended you make the following change in your medication:   START Hydrochlorothiazide 12.5mg  once daily in the morning  *If you need a refill on your cardiac medications before your next appointment, please call your pharmacy*   Lab Work: None ordered today  Testing/Procedures: Your EKG today showed normal sinus rhythm.    Follow-Up: At [facility], you and your health needs are our priority.  As part of our continuing mission to provide you with exceptional heart care, we have created designated Provider Care Teams.  These Care Teams include your primary Cardiologist (physician) and Advanced Practice Providers (APPs -  Physician Assistants and Nurse Practitioners) who all work together to provide you with the care you need, when you need it.  We recommend signing up for the patient portal called "MyChart".  Sign up information is provided on this After Visit Summary.  MyChart is used to connect with patients for Virtual Visits (Telemedicine).  Patients are able to view lab/test results, encounter notes, upcoming appointments, etc.  Non-urgent messages can be sent to your provider as well.   To learn more about what you can do with MyChart, go to [email or web address].    Your next appointment:   6 month(s)  The format for your next appointment:   In Person  Provider:   You may see Kathlyn Sacramento, MD or one of the following Advanced Practice Providers on your designated Care Team:    Murray Hodgkins, NP  Christell Faith, PA-C  Marrianne Mood, PA-C  Cadence Kathlen Mody, [place]  Laurann Montana, NP  Other Instructions  Heart Healthy Diet Recommendations: A low-salt diet is recommended. Meats should be grilled, baked, or boiled. Avoid fried foods. Focus on lean protein sources like fish or chicken with vegetables and fruits. The American Heart Association is a Microbiologist!  American Heart Association Diet and Lifeystyle  Recommendations   Exercise recommendations: The American Heart Association recommends 150 minutes of moderate intensity exercise weekly. Try 30 minutes of moderate intensity exercise 4-5 times per week. This could include walking, jogging, or swimming.  To prevent or reduce lower extremity swelling: . Eat a low salt diet. Salt makes the body hold onto extra fluid which causes swelling. . You can try compression stockings with a zipper on them for easier wearing. . Sit with legs elevated. For example, in the recliner or on an [place].  . Wear knee-high compression stockings during the daytime. Ones labeled 15-20 mmHg provide good compression.

## 2020-11-30 ENCOUNTER — Telehealth: Payer: Self-pay | Admitting: *Deleted

## 2020-11-30 DIAGNOSIS — Z79899 Other long term (current) drug therapy: Secondary | ICD-10-CM

## 2020-11-30 DIAGNOSIS — I25111 Atherosclerotic heart disease of native coronary artery with angina pectoris with documented spasm: Secondary | ICD-10-CM

## 2020-11-30 DIAGNOSIS — I10 Essential (primary) hypertension: Secondary | ICD-10-CM

## 2020-11-30 NOTE — Telephone Encounter (Signed)
-----   Message from Loel Dubonnet, NP sent at 11/29/2020  4:39 PM EDT ----- Hi,  I noticed after Miss Kristin [name]' appointment today that both Amlodipine and Diltiazem were on her list. Can we please ensure she is not taking Amlodipine? If she is, she should discontinue.   Also - checked [facility] and don't see recent bloodwork from urology which was mentioned in clinic. Please ask her to have BMP at [facility] in 1-2 weeks to reassess kidney function after resuming HCTZ 12.5mg  daily.   Thanks! Loel Dubonnet, NP

## 2020-11-30 NOTE — Telephone Encounter (Signed)
No answer or VM after several rings on home phone. No answer. Left message to call back on mobile.

## 2020-12-01 ENCOUNTER — Telehealth: Payer: Self-pay | Admitting: *Deleted

## 2020-12-01 NOTE — Chronic Care Management (AMB) (Signed)
  Care Management   Note  12/01/2020 Name: Kristin [name] MRN: [id number] DOB: 1941-03-04  Kristin [name] is a 80 y.o. year old female who is a primary care patient of [name], [name], [place] and is actively engaged with the care management team. I reached out to [name] by phone today to assist with re-scheduling a follow up visit with the RN Case Manager  Follow up plan: Unsuccessful telephone outreach attempt made. A HIPAA compliant phone message was left for the patient providing contact information and requesting a return call.  The care management team will reach out to the patient again over the next 7 days.  If patient returns call to provider office, please advise to call [place] Lysle Morales at [place] [facility]

## 2020-12-01 NOTE — Telephone Encounter (Signed)
Spoke with patient and husband. States patient is not currently taking amlodipine.  Husband verbalized understanding to get lab work at the [facility] in 1-2 weeks.  No further questions and they were appreciative.

## 2020-12-03 ENCOUNTER — Telehealth: Payer: Self-pay | Admitting: *Deleted

## 2020-12-03 NOTE — Chronic Care Management (AMB) (Signed)
  Care Management   Note  12/03/2020 Name: MEEGHAN SKIPPER MRN: [id number] DOB: Feb 08, 1941  CATRENA VARI is a 80 y.o. year old female who is a primary care patient of [name], [name], [place] and is actively engaged with the care management team. I reached out to [name] by phone today to assist with re-scheduling a follow up visit with the RN Case Manager  Follow up plan: A second unsuccessful telephone outreach attempt made. The care management team will reach out to the patient again over the next 7 days. If patient returns call to provider office, please advise to call [place] at [phone].  [place] Management

## 2020-12-06 ENCOUNTER — Ambulatory Visit (INDEPENDENT_AMBULATORY_CARE_PROVIDER_SITE_OTHER): Payer: PPO | Admitting: Pharmacist

## 2020-12-06 DIAGNOSIS — I10 Essential (primary) hypertension: Secondary | ICD-10-CM | POA: Diagnosis not present

## 2020-12-06 DIAGNOSIS — R42 Dizziness and giddiness: Secondary | ICD-10-CM

## 2020-12-06 DIAGNOSIS — R413 Other amnesia: Secondary | ICD-10-CM

## 2020-12-06 NOTE — Chronic Care Management (AMB) (Signed)
Chronic Care Management Pharmacy Note  12/06/2020 Name:  Kristin [name] MRN:  [id number] DOB:  Apr 08, 1941  Subjective: Kristin [name] is an 80 y.o. year old female who is a primary patient of [name], [name], FNP.  The CCM team was consulted for assistance with disease management and care coordination needs.    Engaged with patient and husband by telephone for follow up visit in response to provider referral for pharmacy case management and/or care coordination services.   Consent to Services:  The patient was given information about Chronic Care Management services, agreed to services, and gave verbal consent prior to initiation of services.  Please see initial visit note for detailed documentation.   Patient Care Team: Malfi, [name], FNP as PCP - General (Family Medicine) Wellington Hampshire, MD as PCP - Cardiology (Cardiology) Wellington Hampshire, MD as Consulting Physician (Cardiology) Birder Robson, MD as Referring Physician (Ophthalmology) Oneta Rack, MD as Consulting Physician (Dermatology) Valinda Party, MD as Consulting Physician (Rheumatology) Vicenta Aly, [place] (Chiropractic Medicine) [place], Virl Diamond, [employer] as Pharmacist Hall Busing Nobie Putnam, RN as Case Manager (General Practice)   Recent consult visits: Office Visit with [facility] Neurology on 3/22.  Office Visit with Cardiology on 4/18.   Hospital visits: None in previous 6 months  Objective:  Lab Results  Component Value Date   CREATININE 1.21 (H) 11/03/2019   CREATININE 0.88 06/14/2018   CREATININE 0.85 02/21/2018    Social History   Tobacco Use  Smoking Status Never Smoker  Smokeless Tobacco Never Used   BP Readings from Last 3 Encounters:  11/29/20 140/64  07/12/20 (!) 123/45  06/02/20 139/62   Pulse Readings from Last 3 Encounters:  11/29/20 61  07/12/20 65  04/12/20 60   Wt Readings from Last 3 Encounters:  11/29/20 128 lb (58.1 kg)  07/12/20 127 lb 12.8 oz (58 kg)   04/12/20 127 lb 12.8 oz (58 kg)    Assessment: Review of patient past medical history, allergies, medications, health status, including review of consultants reports, laboratory and other test data, was performed as part of comprehensive evaluation and provision of chronic care management services.   SDOH:  (Social Determinants of Health) assessments and interventions performed: none   CCM Care Plan  Allergies  Allergen Reactions  . Angiotensin Receptor Blockers Anaphylaxis    Fatigue & Dizziness  . Other Anaphylaxis    Other reaction(s): Other (See Comments) Dizziness, rash Fatigue & Dizziness  . Hydralazine Other (See Comments)    Unknown  . Latex     REACTION: Rash, blisters, breathing probs.  . Losartan     Dizziness   . Methotrexate Derivatives     Dizziness, rash   . Spironolactone     Dizziness   . Penicillins Hives and Rash    Medications Reviewed Today    Reviewed by Vella Raring, RPH-CPP (Pharmacist) on 12/06/20 at 1445  Med List Status: <None>  Medication Order Taking? Sig Documenting Provider Last Dose Status Informant  acetaminophen (TYLENOL) 325 MG tablet [id number] Yes Take 650 mg by mouth every 8 (eight) hours as needed (pain). [provider] Taking Active Self  ASPIRIN 81 PO [id number] Yes Take 1 tablet by mouth daily. [provider] Taking Active Self  betamethasone dipropionate (DIPROLENE) 0.05 % ointment [id number] Yes APPLY TO AFFECTED AREAS TWICE DAILY AS NEEDED [provider] Taking Active   Calcium Carbonate-Vitamin D (CALTRATE 600+D PO) [id number] Yes Take 1 tablet by mouth daily.  [provider] Taking Active Self  Cranberry 400 MG TABS [id number] Yes Take 1 tablet by mouth daily. [provider] Taking Active   diltiazem (CARDIZEM CD) 240 MG 24 hr capsule [id number] Yes Take 1 capsule (240 mg total) by mouth daily. Loel Dubonnet, NP Taking Active   donepezil (ARICEPT) 10 MG tablet [id number] Yes  Take by mouth. [provider] Taking Active   DULoxetine (CYMBALTA) 20 MG capsule [id number] Yes Take 20 mg by mouth daily. [provider] Taking Active   hydrochlorothiazide (MICROZIDE) 12.5 MG capsule [id number] Yes Take 1 capsule (12.5 mg total) by mouth daily. Loel Dubonnet, NP Taking Active   Lactobacillus Rhamnosus, GG, (CULTURELLE PO) [id number] Yes Take 1 capsule by mouth daily. [provider] Taking Active   meclizine (ANTIVERT) 12.5 MG tablet [id number] Yes TAKE 1 TABLET BY MOUTH THREE TIMES DAILYAS NEEDED FOR DIZZINESS Malfi, [name], FNP Taking Active   memantine (NAMENDA) 10 MG tablet [id number] Yes Take 1 tablet (10 mg total) by mouth 2 (two) times daily. Olin Hauser, DO Taking Active   Multiple Vitamin (MULTIVITAMIN) tablet [id number] Yes Take 1 tablet by mouth daily. [provider] Taking Active Self  nitroGLYCERIN (NITROSTAT) 0.4 MG SL tablet [id number] No Place 1 tablet (0.4 mg total) under the tongue every 5 (five) minutes as needed for chest pain.  Patient not taking: Reported on 12/06/2020   Loel Dubonnet, NP Not Taking Active   Omega-3 Fatty Acids (FISH OIL) 1200 MG CAPS [id number] Yes Take 1 capsule by mouth daily.  [provider] Taking Active Self  Polyethyl Glycol-Propyl Glycol 0.4-0.3 % SOLN [id number] Yes Apply 1 drop to eye daily as needed (dry eyes). [provider] Taking Active Self  Red Yeast Rice 600 MG CAPS [id number] Yes Take 2 capsules by mouth daily. [provider] Taking Active   traMADol (ULTRAM) 50 MG tablet [id number]  TAKE ONE TABLET (50 MG) BY MOUTH EVERY 12 HOURS AS NEEDED FOR MODERATE PAIN Mikey College, NP  Active           Patient Active Problem List   Diagnosis Date Noted  . Dysuria 04/12/2020  . Encounter for screening mammogram for malignant neoplasm of breast 03/15/2020  . Porokeratosis 09/18/2019  . Rheumatoid arthritis ([place]) 07/27/2017  . 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 Counseling regarding end of life decision making 01/14/2015  . Oral candidiasis 10/27/2014  . Short-term memory loss 10/17/2013  . Cerebral vascular disease 01/01/2013  . 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[place])   . PVC's (premature ventricular contractions) 06/03/2012  . Post herpetic neuralgia 10/11/2011  . Osteoarthritis 08/10/2011  . Chronic fatigue 08/10/2011  . Dizziness 08/10/2011  . MYOCARDIAL INFARCTION, HX OF 08/01/2010  . 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 Fibromyalgia 05/20/2008  . Hyperlipidemia 05/29/2007  . OBSTRUCTIVE SLEEP APNEA 05/29/2007  . Essential hypertension, benign 05/29/2007  . ALLERGIC RHINITIS 05/29/2007  . ACNE, ROSACEA 05/29/2007    Immunization History  Administered Date(s) Administered  . Fluad Quad(high Dose 65+) 06/03/2019, 06/01/2020  . Influenza Split 05/16/2012  . Influenza Whole 05/15/2007, 05/20/2008, 05/14/2009, 05/14/2010  . Influenza, High Dose Seasonal PF 05/11/2018  . Influenza,inj,Quad PF,6+ Mos 05/13/2016, 06/12/2017  . Influenza-Unspecified 05/14/2013, 06/06/2014  . PFIZER(Purple Top)SARS-COV-2 Vaccination 09/10/2019, 10/01/2019  . Pneumococcal Conjugate-13 10/17/2013  . Pneumococcal Polysaccharide-23 08/14/2005, 02/19/2017  . Td 08/14/2002  . Zoster 08/14/2005, 05/18/2008  Conditions to be addressed/monitored: HTN and memory loss  Care Plan : PharmD - Medication Mgmt  Updates made by Vella Raring, RPH-CPP since 12/06/2020 12:00 AM    Problem: Disease Progression     Long-Range Goal: Disease Progression Prevented or Minimized   Start Date: 09/29/2020  Expected End Date: 12/28/2020  This Visit's Progress: On track  Recent Progress: On track  Priority: High  Note:   Current Barriers:  . 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   Interventions: . 1:1 collaboration with Olin Hauser, DO regarding development and update of comprehensive plan of care as evidenced by provider attestation and co-signature . Inter-disciplinary care team collaboration (see longitudinal plan of care) . Perform chart review.  o Patient seen for Office Visit with [facility] Neurology on 3/22. Provider advised patient to: - Increase Aricept to 10 mg at night - Balance exercises 5 minutes every morning - Follow up in 2-3 months o Office Visit with Cardiology on 4/18. Provider advised patient to: - Restart HCTZ 12.5 mg daily - Follow-up with neurology regarding dizziness o Per telephone note on 4/19, provider requested follow up call to patient to confirm patient NOT taking amlodipine and to advise to complete BMP in 1-2 weeks after restart of HCTZ 12.5 mg daily . Comprehensive medication review performed; medication list updated in electronic medical record o Remind caregiver to keep as needed medications out of pillbox so can be given to patient as needed as directed o Reports patient not currently taking tramadol to avoid any potential dizziness from this medication  Hypertension . Current treatment:  o Diltiazem CD 240 mg once daily o HCTZ 12.5 mg daily (restarted following visit with Cardiologist on 4/18) . Counsel husband to give HCTZ doses in morning to avoid nocturia . Identify patient currently taking amlodipine 5 mg daily. Advise caregiver/patient to discontinue amlodipine as directed by Cardiologist per 4/19 telephone note. o Mr. Badman confirms will stop giving amlodipine, to remove from pillbox . Reports latest  home BP readings: o 4/12: 129/62, HR 77 . Encourage patient and caregiver to restart check home BP, keep log, and bring to medical appointments . Remind patient/caregiver for need to return for lab work in 1-2 weeks after start of HCTZ. Confirm will return for labs next week . Will collaborate with Cardiologist  Patient Goals/Self-Care Activities . Over the next 90 days, patient/spouse will:  - check blood pressure, document, and provide at future appointments - attend all scheduled provider appointments with help of spouse  Follow Up Plan: Telephone follow up appointment with care management team member scheduled for: 6/1 at 3 pm      Patient's preferred pharmacy is:  [place], [place] - [address] [place] [address] Phone: [phone] Fax: [phone]  Uses pill box? Yes  Follow Up:  Patient agrees to Care Plan and Follow-up.  Plan: Telephone follow up appointment with care management team member scheduled for:  6/1 at 3 pm  Harlow Asa, PharmD, [place], [facility] [phone]

## 2020-12-06 NOTE — Patient Instructions (Signed)
Visit Information  PATIENT GOALS: Goals Addressed            This Visit's Progress   . Pharmacy goals       Please check blood pressure at home, keep log, and bring to medical appointments  Feel free to call me with any questions or concerns. I look forward to our next call!  Harlow Asa, PharmD, [place], CPP Clinical Pharmacist [facility] [phone]        The patient verbalized understanding of instructions, educational materials, and care plan provided today and declined offer to receive copy of patient instructions, educational materials, and care plan.   Telephone follow up appointment with care management team member scheduled for: 6/1 at 3 pm

## 2020-12-08 DIAGNOSIS — M5441 Lumbago with sciatica, right side: Secondary | ICD-10-CM | POA: Diagnosis not present

## 2020-12-08 DIAGNOSIS — M533 Sacrococcygeal disorders, not elsewhere classified: Secondary | ICD-10-CM | POA: Diagnosis not present

## 2020-12-08 DIAGNOSIS — M5442 Lumbago with sciatica, left side: Secondary | ICD-10-CM | POA: Diagnosis not present

## 2020-12-08 DIAGNOSIS — M48062 Spinal stenosis, lumbar region with neurogenic claudication: Secondary | ICD-10-CM | POA: Diagnosis not present

## 2020-12-08 DIAGNOSIS — G8929 Other chronic pain: Secondary | ICD-10-CM | POA: Diagnosis not present

## 2020-12-13 DIAGNOSIS — Z872 Personal history of diseases of the skin and subcutaneous tissue: Secondary | ICD-10-CM | POA: Diagnosis not present

## 2020-12-13 DIAGNOSIS — M159 Polyosteoarthritis, unspecified: Secondary | ICD-10-CM | POA: Diagnosis not present

## 2020-12-13 DIAGNOSIS — M539 Dorsopathy, unspecified: Secondary | ICD-10-CM | POA: Diagnosis not present

## 2020-12-15 ENCOUNTER — Telehealth: Payer: Self-pay | Admitting: Family

## 2020-12-15 ENCOUNTER — Other Ambulatory Visit
Admission: RE | Admit: 2020-12-15 | Discharge: 2020-12-15 | Disposition: A | Discharge status: Home / Self Care | Payer: PPO | Source: Ambulatory Visit | Attending: Family | Admitting: Family

## 2020-12-15 DIAGNOSIS — E876 Hypokalemia: Secondary | ICD-10-CM

## 2020-12-15 DIAGNOSIS — I25111 Atherosclerotic heart disease of native coronary artery with angina pectoris with documented spasm: Secondary | ICD-10-CM | POA: Insufficient documentation

## 2020-12-15 DIAGNOSIS — I10 Essential (primary) hypertension: Secondary | ICD-10-CM | POA: Insufficient documentation

## 2020-12-15 DIAGNOSIS — Z79899 Other long term (current) drug therapy: Secondary | ICD-10-CM | POA: Insufficient documentation

## 2020-12-15 LAB — BASIC METABOLIC PANEL
Anion gap: 13 (ref 5–15)
BUN: 29 mg/dL — ABNORMAL HIGH (ref 8–23)
CO2: 30 mmol/L (ref 22–32)
Calcium: 10.7 mg/dL — ABNORMAL HIGH (ref 8.9–10.3)
Chloride: 94 mmol/L — ABNORMAL LOW (ref 98–111)
Creatinine, Ser: 1.47 mg/dL — ABNORMAL HIGH (ref 0.44–1.00)
GFR, Estimated: 36 mL/min — ABNORMAL LOW (ref >60)
Glucose, Bld: 171 mg/dL — ABNORMAL HIGH (ref 70–99)
Potassium: 2.7 mmol/L — CL (ref 3.5–5.1)
Sodium: 137 mmol/L (ref 135–145)

## 2020-12-15 MED ORDER — POTASSIUM CHLORIDE CRYS ER 20 MEQ PO TBCR: EXTENDED_RELEASE_TABLET | ORAL | 0 refills | Status: DC

## 2020-12-15 NOTE — Telephone Encounter (Signed)
Low potassium noted. Kidney function shows slight decline compared to previous.   Recommend:  STOP Hydrochlorothiazide 12.5mg  daily.  Please ensure she is not taking additional diuretic such as Furosemide or Torsemide.  START Potassium 40 mEq twice daily x 2 days, then Potassium 45mEq daily.   Please have her have repeat BMP at [facility] on Friday 12/17/20 before [place], NP

## 2020-12-15 NOTE — Telephone Encounter (Addendum)
Spoke with the patient and her husband.  Patient made aware of lab results and Laurann Montana, NP recommendation.  -STOP HCTZ -Verified that the patient does not take any other diuretics. -START Potassium 40 MEQ bid x2 days. Then reduce Potassium to 40 MEQ daily Rx sent to the patients pharmacy. -[facility] 12/17/20  Patient and her husband verbalized understanding to the instructions given. Patient sts that her symptoms are at her baseline. No questions or concerns at this time. Adv the patient to contact the office if symptoms develop or questions arise.

## 2020-12-15 NOTE — Telephone Encounter (Signed)
Marissa calling from [facility] lab with a critical Potassium.  Result: 2.7

## 2020-12-15 NOTE — Telephone Encounter (Signed)
[place] calling with bmp critical results.

## 2020-12-15 NOTE — Telephone Encounter (Signed)
Called patient to make her aware of Laurann Montana, NP recommendations.  No answer. LMOV to call back.

## 2020-12-16 NOTE — Telephone Encounter (Signed)
Rescheduled for 6/2.

## 2020-12-17 ENCOUNTER — Telehealth: Payer: Self-pay | Admitting: *Deleted

## 2020-12-17 ENCOUNTER — Other Ambulatory Visit
Admission: RE | Admit: 2020-12-17 | Discharge: 2020-12-17 | Disposition: A | Discharge status: Home / Self Care | Payer: PPO | Source: Ambulatory Visit | Attending: Family | Admitting: Family

## 2020-12-17 DIAGNOSIS — Z79899 Other long term (current) drug therapy: Secondary | ICD-10-CM

## 2020-12-17 DIAGNOSIS — E876 Hypokalemia: Secondary | ICD-10-CM | POA: Insufficient documentation

## 2020-12-17 LAB — BASIC METABOLIC PANEL
Anion gap: 13 (ref 5–15)
BUN: 22 mg/dL (ref 8–23)
CO2: 28 mmol/L (ref 22–32)
Calcium: 10.9 mg/dL — ABNORMAL HIGH (ref 8.9–10.3)
Chloride: 99 mmol/L (ref 98–111)
Creatinine, Ser: 1.19 mg/dL — ABNORMAL HIGH (ref 0.44–1.00)
GFR, Estimated: 46 mL/min — ABNORMAL LOW (ref >60)
Glucose, Bld: 115 mg/dL — ABNORMAL HIGH (ref 70–99)
Potassium: 3.7 mmol/L (ref 3.5–5.1)
Sodium: 140 mmol/L (ref 135–145)

## 2020-12-17 MED ORDER — POTASSIUM CHLORIDE CRYS ER 20 MEQ PO TBCR: 20.0000 meq | EXTENDED_RELEASE_TABLET | Freq: Every day | ORAL | 0 refills | Status: DC

## 2020-12-17 NOTE — Telephone Encounter (Signed)
Spoke to pt and husband on speaker phone, notified of lab results and provider's recc. Pt states that she did NOT see significant improvement in LE edema on HCTZ. Pt and husband verbalize understanding.  Pt will continue to stay off HCTZ. Will start Potassium 86mEq daily and [facility] in [facility].  Husband states pt has plenty of potassium on hand.  Lab orders placed. Pt and husband have no further questions at this time.

## 2020-12-17 NOTE — Telephone Encounter (Signed)
-----   Message from Loel Dubonnet, NP sent at 12/17/2020  9:30 AM EDT ----- Potassium level much improved after supplementation. Appreciate her getting repeat lab work done.   At last clinic visit we resumed HCTZ due to LE edema. It was discontinued when she became hypokalemic. If she noted significant improvement in edema on HCTZ we could trial HCTZ 12.5mg  QD with potassium 59mEq QD and repeat BMP in 1 week.  If she did not see improvement, would not recommend resuming. If not resuming HCTZ, recommend Potassium 14mEq daily and repeat labs in 1 week.

## 2020-12-24 ENCOUNTER — Other Ambulatory Visit: Payer: Self-pay | Admitting: *Deleted

## 2020-12-24 ENCOUNTER — Other Ambulatory Visit
Admission: RE | Admit: 2020-12-24 | Discharge: 2020-12-24 | Disposition: A | Discharge status: Home / Self Care | Payer: PPO | Attending: Family | Admitting: Family

## 2020-12-24 DIAGNOSIS — Z79899 Other long term (current) drug therapy: Secondary | ICD-10-CM | POA: Insufficient documentation

## 2020-12-24 DIAGNOSIS — E876 Hypokalemia: Secondary | ICD-10-CM | POA: Diagnosis not present

## 2020-12-24 LAB — BASIC METABOLIC PANEL
Anion gap: 12 (ref 5–15)
BUN: 19 mg/dL (ref 8–23)
CO2: 25 mmol/L (ref 22–32)
Calcium: 9.6 mg/dL (ref 8.9–10.3)
Chloride: 105 mmol/L (ref 98–111)
Creatinine, Ser: 1.16 mg/dL — ABNORMAL HIGH (ref 0.44–1.00)
GFR, Estimated: 48 mL/min — ABNORMAL LOW (ref >60)
Glucose, Bld: 83 mg/dL (ref 70–99)
Potassium: 3.3 mmol/L — ABNORMAL LOW (ref 3.5–5.1)
Sodium: 142 mmol/L (ref 135–145)

## 2020-12-24 MED ORDER — POTASSIUM CHLORIDE CRYS ER 20 MEQ PO TBCR
EXTENDED_RELEASE_TABLET | ORAL | 0 refills | Status: DC
Start: 2020-12-24 — End: 2021-02-16

## 2020-12-27 ENCOUNTER — Other Ambulatory Visit: Payer: Self-pay | Admitting: Family Medicine

## 2020-12-27 ENCOUNTER — Other Ambulatory Visit: Payer: Self-pay | Admitting: *Deleted

## 2020-12-27 DIAGNOSIS — R413 Other amnesia: Secondary | ICD-10-CM

## 2020-12-27 DIAGNOSIS — I25111 Atherosclerotic heart disease of native coronary artery with angina pectoris with documented spasm: Secondary | ICD-10-CM

## 2020-12-27 DIAGNOSIS — Z79899 Other long term (current) drug therapy: Secondary | ICD-10-CM

## 2020-12-27 DIAGNOSIS — E876 Hypokalemia: Secondary | ICD-10-CM

## 2020-12-27 NOTE — Telephone Encounter (Signed)
Requested medication (s) are due for refill today: Yes  Requested medication (s) are on the active medication list: Yes  Last refill:  01/02/21  Future visit scheduled: Yes  Notes to clinic:  Unable to refill due to last refill by Dr. Raliegh Ip, patient now has appointment with Rollene Fare     Requested Prescriptions  Pending Prescriptions Disp Refills   memantine ([place]) 10 MG tablet [Pharmacy Med Name: MEMANTINE HCL 10 MG TAB] 180 tablet 0    Sig: TAKE 1 TABLET BY MOUTH TWICE DAILY      Neurology:  Alzheimer's Agents Passed - 12/27/2020  4:29 PM      Passed - Valid encounter within last 6 months    Recent Outpatient Visits           5 months ago [facility] Malfi, [name], FNP   5 months ago [facility] [place], [name], FNP   8 months ago Essential hypertension, benign   [facility], [name], FNP   8 months ago Essential hypertension, benign   [facility], [name], FNP   9 months ago Essential hypertension, benign   [facility], [facility], [place]   In 1 week [place], Coralie Keens, NP [facility], [facility]

## 2020-12-27 NOTE — Telephone Encounter (Signed)
Patient called and spoke to her husband about the need to schedule with a new provider since PCP is no longer at the office, he verbalized understanding. Appointment scheduled for Monday, 01/03/21 at [place] with Webb Silversmith, [place].

## 2020-12-28 ENCOUNTER — Ambulatory Visit (INDEPENDENT_AMBULATORY_CARE_PROVIDER_SITE_OTHER): Payer: PPO

## 2020-12-28 VITALS — Ht 67.0 in | Wt 125.0 lb

## 2020-12-28 DIAGNOSIS — Z0000 Encounter for general adult medical examination without abnormal findings: Secondary | ICD-10-CM

## 2020-12-28 NOTE — Progress Notes (Signed)
I connected with Kristin [name] today by telephone and verified that I am speaking with the correct person using two identifiers. Location patient: home Location provider: work Persons participating in the virtual visit: Theora Master LPN.   I discussed the limitations, risks, security and privacy concerns of performing an evaluation and management service by telephone and the availability of in person appointments. I also discussed with the patient that there may be a patient responsible charge related to this service. The patient expressed understanding and verbally consented to this telephonic visit.    Interactive audio and video telecommunications were attempted between this provider and patient, however failed, due to patient having technical difficulties OR patient did not have access to video capability.  We continued and completed visit with audio only.     Vital signs may be patient reported or missing.  Subjective:   Kristin [name] is a 80 y.o. female who presents for Medicare Annual (Subsequent) preventive examination.  Review of Systems     Cardiac Risk Factors include: advanced age (>71men, >61 women);dyslipidemia;hypertension     Objective:    Today's Vitals   12/28/20 0816 12/28/20 0817  Weight: 125 lb (56.7 kg)   Height: 5\' 7"  (1.702 m)   PainSc:  5    Body mass index is 19.58 kg/m.  Advanced Directives 12/28/2020 11/04/2019 06/14/2018 02/21/2018 08/21/2017 06/18/2017 02/19/2017  Does Patient Have a Medical Advance Directive? Yes Yes Yes Yes Yes Yes Yes  Type of Paramedic of [place];Living will Living will;Healthcare Power of [place];Living will [place];Living will Living will [place];Living will [place];Living will  Does patient want to make changes to medical advance directive? - - - - - - -  Copy of [place] in  Chart? No - copy requested No - copy requested No - copy requested No - copy requested - - No - copy requested  Would patient like information on creating a medical advance directive? - - - - - - -    Current Medications (verified) Outpatient Encounter Medications as of 12/28/2020  Medication Sig  . acetaminophen (TYLENOL) 325 MG tablet Take 650 mg by mouth every 8 (eight) hours as needed (pain).  . ASPIRIN 81 PO Take 1 tablet by mouth daily.  . betamethasone dipropionate (DIPROLENE) 0.05 % ointment APPLY TO AFFECTED AREAS TWICE DAILY AS NEEDED  . Calcium Carbonate-Vitamin D (CALTRATE 600+D PO) Take 1 tablet by mouth daily.   . Cranberry 400 MG TABS Take 1 tablet by mouth daily.  [name] diltiazem (CARDIZEM CD) 240 MG 24 hr capsule Take 1 capsule (240 mg total) by mouth daily.  [name] donepezil (ARICEPT) 10 MG tablet Take by mouth.  . DULoxetine (CYMBALTA) 20 MG capsule Take 20 mg by mouth daily.  . Lactobacillus Rhamnosus, GG, (CULTURELLE PO) Take 1 capsule by mouth daily.  . meclizine (ANTIVERT) 12.5 MG tablet TAKE 1 TABLET BY MOUTH THREE TIMES DAILYAS NEEDED FOR DIZZINESS  . memantine (NAMENDA) 10 MG tablet Take 1 tablet (10 mg total) by mouth 2 (two) times daily.  . Multiple Vitamin (MULTIVITAMIN) tablet Take 1 tablet by mouth daily.  . nitroGLYCERIN (NITROSTAT) 0.4 MG SL tablet Place 1 tablet (0.4 mg total) under the tongue every 5 (five) minutes as needed for chest pain.  . Omega-3 Fatty Acids (FISH OIL) 1200 MG CAPS Take 1 capsule by mouth daily.   Vladimir Faster Glycol-Propyl Glycol 0.4-0.3 % SOLN  Apply 1 drop to eye daily as needed (dry eyes).  . potassium chloride SA (KLOR-CON) 20 MEQ tablet Take 2 tablets (40 mEq total) by mouth every Monday, Wednesday, and Friday AND 1 tablet (20 mEq total) every Tuesday, Thursday, Saturday, and Sunday.  . traMADol (ULTRAM) 50 MG tablet TAKE ONE TABLET (50 MG) BY MOUTH EVERY 12 HOURS AS NEEDED FOR MODERATE PAIN  . Red Yeast Rice 600 MG CAPS Take 2 capsules by  mouth daily. (Patient not taking: Reported on 12/28/2020)   No facility-administered encounter medications on file as of 12/28/2020.    Allergies (verified) Angiotensin receptor blockers, Other, Hydralazine, Latex, Losartan, Methotrexate derivatives, Spironolactone, and Penicillins   History: Past Medical History:  Diagnosis Date  . Allergy   . Arthritis    recent falls -aug 2010  . [name] (coronary artery disease)    mi-1996  . Complication of anesthesia    pt states takes a long time to wake her wake up  . 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 Cough    no fever   . Degeneration of lumbar or lumbosacral intervertebral disc   . Dizziness    r/t meds  . DVT (deep venous thrombosis) ([place])   . Fibromyalgia   . History of shingles   . [name] (irritable bowel syndrome)    takes OTC [place] Negus colon health  . Joint pain   . Joint swelling   . Myalgia and myositis, unspecified   . Obstructive sleep apnea (adult) (pediatric)   . Old myocardial infarction 1996  . Osteoarthritis   . Osteoarthrosis, unspecified whether generalized or localized, unspecified site   . Psoriatic arthritis ([place])   . Rheumatoid arthritis ([place])   . Rosacea   . Seasonal allergies   . Stroke [facility])    ischaemic microvascular disease  . 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 Unspecified essential hypertension    takes Diltiazem,Metoprolol,and Losartan daily  . UTI (lower urinary tract infection)    frequent=last one was winter of 2014   Past Surgical History:  Procedure Laterality Date  . ATHERECTOMY  1996  . [place]   vasospams; no blockage  . CARDIAC CATHETERIZATION  2013   No obstructive coronary artery disease. Mild distal LAD and proximal RCA disease.  [name] CATARACT EXTRACTION Bilateral   . COLONOSCOPY    . CT ABD WO/W & PELVIS WO CM  2013   no acute  process, + minimal diverticulosis and hepatic/renal cysts, ATH calcifications  . ETHMOIDECTOMY  1998  . 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 NASAL SINUS SURGERY  1998  . OOPHORECTOMY     BSO  . sinus biopsy  1998   inverted papiloma  . VAGINAL HYSTERECTOMY  1992   for mennorhagia-LAVH BSO   Family History  Problem Relation Age of Onset  . Colon cancer Father   . Heart attack Mother   . Coronary artery disease Mother   . Osteoporosis Mother   . Heart disease Mother   . Heart failure Mother   . GER disease Sister   . Skin cancer Sister   . Hypertension Sister   . Osteopenia Sister   . GER disease Sister   .  Breast cancer Paternal Aunt        age 81  . Heart failure Maternal Grandmother   . Heart failure Maternal Grandfather    Social History   Socioeconomic History  . Marital status: Married    Spouse name: Darnell Level  . Number of children: 2  . Years of education: BSN  . Highest education level: Not on file  Occupational History  . 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 Smoking status: Never Smoker  . Smokeless tobacco: Never Used  Vaping Use  . Vaping Use: Never used  Substance and Sexual Activity  . Alcohol use: No    Alcohol/week: 0.0 standard drinks    Comment: Rare  . Drug use: No  . Sexual activity: Not Currently    Birth control/protection: Surgical    Comment: 1st intercourse 31 yo-2 partners  Other Topics Concern  . Not on file  Social History Narrative   12/29/19 lives with husband   No regular exercise-used to do [place] will.. She is DNR, [name] is husband [name] , then son [name]       10/22/12-cardiac work up found the reason for her dizziness,  she needs no neurological follow  up for that.  her enthusiasm for CPAP remains limited and is explained by her resiudal high AHI after she finally reached compliance ,[place] 13 points.     95% pressure is 11.7 cm , AHI  20 .7- she does not benefit from CPAP . She is allowed to d/c the machine and we need to concerntrate on her dizziness and facial right droop. ordered MRI brain repeat.    Eye exam next week. PT at [facility]. MRI brain in April- per patients request order next month.          Social Determinants of Health   Financial Resource Strain: Low Risk   . Difficulty of Paying Living Expenses: Not hard at all  Food Insecurity: No Food Insecurity  . Worried About Charity fundraiser in the Last Year: Never true  . Ran Out of Food in the Last Year: Never true  Transportation Needs: No Transportation Needs  . Lack of Transportation (Medical): No  . Lack of Transportation (Non-Medical): No  Physical Activity: Insufficiently Active  . Days of Exercise per Week: 3 days  . Minutes of Exercise per Session: 20 min  Stress: No Stress Concern Present  . Feeling of Stress : Not at all  Social Connections: Not on file    Tobacco Counseling Counseling given: Not Answered   Clinical Intake:  Pre-visit preparation completed: Yes  Pain : 0-10 Pain Score: 5  Pain Type: Chronic pain Pain Location: Generalized Pain Descriptors / Indicators: Aching Pain Onset: More than a month ago Pain Frequency: Constant     Nutritional Status: BMI of 19-24  Normal Nutritional Risks: None Diabetes: No  How often do you need to have someone help you when you read instructions, pamphlets, or other written materials from your doctor or pharmacy?: 1 - Never What is the last grade level you completed in school?: college  Diabetic? no  Interpreter Needed?: No  Information entered by :: NAllen LPN   Activities of Daily Living In your present state of health, do you have any difficulty performing the following activities:  12/28/2020  Hearing? N  Vision? N  Difficulty concentrating or making decisions? Y  Walking or climbing stairs? N  Dressing or bathing? N  Doing errands, shopping? Y  Comment does not Physiological scientist and eating ? N  Using the Toilet? N  In the past six months, have you accidently leaked urine? N  Do you have problems with loss of bowel control? N  Managing your Medications? Y  Comment husband manages  Managing your Finances? Y  Housekeeping or managing your Housekeeping? Y  Some recent data might be hidden    Patient Care Team: [place], [name], FNP as PCP - General (Family Medicine) Wellington Hampshire, MD as PCP - Cardiology (Cardiology) Wellington Hampshire, MD as Consulting Physician (Cardiology) Birder Robson, MD as Referring Physician (Ophthalmology) Oneta Rack, MD as Consulting Physician (Dermatology) Valinda Party, MD as Consulting Physician (Rheumatology) Vicenta Aly, [place] (Chiropractic Medicine) [place], Virl Diamond, [place] as Pharmacist Vanita Ingles, RN as Case Manager (General Practice)  Indicate any recent Medical Services you may have received from other than [facility] providers in the past year (date may be approximate).     Assessment:   This is a routine wellness examination for [place].  Hearing/Vision screen No exam data present  Dietary issues and exercise activities discussed: Current Exercise Habits: Home exercise routine, Type of exercise: walking, Time (Minutes): 20, Frequency (Times/Week): 3, Weekly Exercise (Minutes/Week): 60  Goals Addressed            This Visit's Progress   . Patient Stated       12/28/2020, no goals      Depression Screen PHQ 2/9 Scores 12/28/2020 12/18/2019 11/04/2019 03/04/2019 09/25/2018 02/21/2018 02/19/2017  PHQ - 2 Score 0 0 0 0 - 0 0  PHQ- 9 Score - - - - - 0 -  Exception Documentation - - - - Patient refusal - -    Fall Risk Fall Risk  12/28/2020 12/29/2019 11/04/2019 10/06/2019 09/25/2018  Falls in the  past year? 0 0 0 0 0  Number falls in past yr: - - 0 0 -  Injury with Fall? - - 0 - -  Risk Factor Category  - - - - -  Risk for fall due to : Medication side effect - - No Fall Risks -  Risk for fall due to: Comment - - - - -  Follow up Falls evaluation completed;Education provided;Falls prevention discussed - - - Falls evaluation completed    FALL RISK PREVENTION PERTAINING TO THE HOME:  Any stairs in or around the home? Yes  If so, are there any without handrails? No  Home free of loose throw rugs in walkways, pet beds, electrical cords, etc? Yes  Adequate lighting in your home to reduce risk of falls? Yes   ASSISTIVE DEVICES UTILIZED TO PREVENT FALLS:  Life alert? No  Use of a cane, walker or w/c? No  Grab bars in the bathroom? Yes  Shower chair or bench in shower? No  Elevated toilet seat or a handicapped toilet? No   TIMED UP AND GO:  Was the test performed? No .   Cognitive Function: MMSE - Mini Mental State Exam 12/29/2019 07/02/2018 02/21/2018 02/19/2017 01/28/2016  Orientation to time 0 2 5 3 5   Orientation to time comments - - - Does not know date or day of the week. -  Orientation to Place 4 5 5 5 5   Registration 3 3 3 3 3   Attention/ Calculation 0 0 0 0 0  Recall 0 0 0 0 0  Recall-comments - - unable to recall 3 of 3 words - pt was unable to recall 3 of 3 words  Language- name 2 objects 2 2 0 0 0  Language- repeat 1 1 1 1 1   Language- follow 3 step command 2 3 3 3 3   Language- read & follow direction 1 1 0 0 0  Write a sentence 1 1 0 0 0  Copy design 0 1 0 0 0  Total score 14 19 17 15 17      6CIT Screen 12/28/2020 06/03/2019  What Year? 0 points 0 points  What month? 0 points 3 points  What time? 0 points 3 points  Count back from 20 0 points 2 points  Months in reverse 4 points 4 points  Repeat phrase 10 points 10 points  Total Score 14 22    Immunizations Immunization History  Administered Date(s) Administered  . Fluad Quad(high Dose 65+)  06/03/2019, 06/01/2020  . Influenza Split 05/16/2012  . Influenza Whole 05/15/2007, 05/20/2008, 05/14/2009, 05/14/2010  . Influenza, High Dose Seasonal PF 05/11/2018  . Influenza,inj,Quad PF,6+ Mos 05/13/2016, 06/12/2017  . Influenza-Unspecified 05/14/2013, 06/06/2014  . PFIZER(Purple Top)SARS-COV-2 Vaccination 09/10/2019, 10/01/2019  . Pneumococcal Conjugate-13 10/17/2013  . Pneumococcal Polysaccharide-23 08/14/2005, 02/19/2017  . Td 08/14/2002  . Zoster 08/14/2005, 05/18/2008    TDAP status: Up to date  Flu Vaccine status: Up to date  Pneumococcal vaccine status: Up to date  Covid-19 vaccine status: Completed vaccines  Qualifies for Shingles Vaccine? Yes   Zostavax completed Yes   Shingrix Completed?: No.    Education has been provided regarding the importance of this vaccine. Patient has been advised to call insurance company to determine out of pocket expense if they have not yet received this vaccine. Advised may also receive vaccine at local pharmacy or Health Dept. Verbalized acceptance and understanding.  Screening Tests Health Maintenance  Topic Date Due  . INFLUENZA VACCINE  03/14/2021  . TETANUS/TDAP  05/28/2025  . DEXA SCAN  Completed  . COVID-19 Vaccine  Completed  . PNA vac Low Risk Adult  Completed  . HPV VACCINES  Aged Out    Health Maintenance  There are no preventive care reminders to display for this patient.  Colorectal cancer screening: No longer required.   Mammogram status: No longer required due to age.  Bone Density status: Completed 04/08/2018.   Lung Cancer Screening: (Low Dose CT Chest recommended if Age 60-80 years, 30 pack-year currently smoking OR have quit w/in 15years.) does not qualify.   Lung Cancer Screening Referral: no  Additional Screening:  Hepatitis C Screening: does not qualify;   Vision Screening: Recommended annual ophthalmology exams for early detection of glaucoma and other disorders of the eye. Is the patient up to  date with their annual eye exam?  Yes  Who is the provider or what is the name of the office in which the patient attends annual eye exams? Does not remember name If pt is not established with a provider, would they like to be referred to a provider to establish care? No .   Dental Screening: Recommended annual dental exams for proper oral hygiene  Community Resource Referral / Chronic Care Management: CRR required this visit?  No   CCM required this visit?  No      Plan:     I have personally reviewed and noted the following in the patient's chart:   . Medical and social history . Use of  alcohol, tobacco or illicit drugs  . Current medications and supplements including opioid prescriptions.  . Functional ability and status . Nutritional status . Physical activity . Advanced directives . List of other physicians . Hospitalizations, surgeries, and ER visits in previous 12 months . Vitals . Screenings to include cognitive, depression, and falls . Referrals and appointments  In addition, I have reviewed and discussed with patient certain preventive protocols, quality metrics, and best practice recommendations. A written personalized care plan for preventive services as well as general preventive health recommendations were provided to patient.     Kellie Simmering, LPN   4/92/0100   Nurse Notes:

## 2020-12-28 NOTE — Patient Instructions (Signed)
[name] , Thank you for taking time to come for your Medicare Wellness Visit. I appreciate your ongoing commitment to your health goals. Please review the following plan we discussed and let me know if I can assist you in the future.   Screening recommendations/referrals: Colonoscopy: not required Mammogram: not required Bone Density: completed 04/08/2018 Recommended yearly ophthalmology/optometry visit for glaucoma screening and checkup Recommended yearly dental visit for hygiene and checkup  Vaccinations: Influenza vaccine: completed 06/14/2020, due 03/14/2021 Pneumococcal vaccine: completed 02/19/2017 Tdap vaccine: completed 05/29/2015, due 05/28/2025 Shingles vaccine: discussed   Covid-19: 05/17/2020, 10/01/2019, 09/10/2019  Advanced directives: Please bring a copy of your POA (Power of Attorney) and/or Living Will to your next appointment.   Conditions/risks identified: none  Next appointment: Follow up in one year for your annual wellness visit    Preventive Care 65 Years and Older, Female Preventive care refers to lifestyle choices and visits with your health care provider that can promote health and wellness. What does preventive care include?  A yearly physical exam. This is also called an annual well check.  Dental exams once or twice a year.  Routine eye exams. Ask your health care provider how often you should have your eyes checked.  Personal lifestyle choices, including:  Daily care of your teeth and gums.  Regular physical activity.  Eating a healthy diet.  Avoiding tobacco and drug use.  Limiting alcohol use.  Practicing safe sex.  Taking low-dose aspirin every day.  Taking vitamin and mineral supplements as recommended by your health care provider. What happens during an annual well check? The services and screenings done by your health care provider during your annual well check will depend on your age, overall health, lifestyle risk factors, and family  history of disease. Counseling  Your health care provider may ask you questions about your:  Alcohol use.  Tobacco use.  Drug use.  Emotional well-being.  Home and relationship well-being.  Sexual activity.  Eating habits.  History of falls.  Memory and ability to understand (cognition).  Work and work Statistician.  Reproductive health. Screening  You may have the following tests or measurements:  Height, weight, and BMI.  Blood pressure.  Lipid and cholesterol levels. These may be checked every 5 years, or more frequently if you are over 28 years old.  Skin check.  Lung cancer screening. You may have this screening every year starting at age 85 if you have a 30-pack-year history of smoking and currently smoke or have quit within the past 15 years.  Fecal occult blood test (FOBT) of the stool. You may have this test every year starting at age 18.  Flexible sigmoidoscopy or colonoscopy. You may have a sigmoidoscopy every 5 years or a colonoscopy every 10 years starting at age 4.  Hepatitis C blood test.  Hepatitis B blood test.  Sexually transmitted disease (STD) testing.  Diabetes screening. This is done by checking your blood sugar (glucose) after you have not eaten for a while (fasting). You may have this done every 1-3 years.  Bone density scan. This is done to screen for osteoporosis. You may have this done starting at age 28.  Mammogram. This may be done every 1-2 years. Talk to your health care provider about how often you should have regular mammograms. Talk with your health care provider about your test results, treatment options, and if necessary, the need for more tests. Vaccines  Your health care provider may recommend certain vaccines, such as:  Influenza  vaccine. This is recommended every year.  Tetanus, diphtheria, and acellular pertussis (Tdap, Td) vaccine. You may need a Td booster every 10 years.  Zoster vaccine. You may need this after  age 50.  Pneumococcal 13-valent conjugate (PCV13) vaccine. One dose is recommended after age 36.  Pneumococcal polysaccharide (PPSV23) vaccine. One dose is recommended after age 87. Talk to your health care provider about which screenings and vaccines you need and how often you need them. This information is not intended to replace advice given to you by your health care provider. Make sure you discuss any questions you have with your health care provider. Document Released: 08/27/2015 Document Revised: 04/19/2016 Document Reviewed: 06/01/2015 Elsevier Interactive Patient Education  2017 [place] Prevention in the Home Falls can cause injuries. They can happen to people of all ages. There are many things you can do to make your home safe and to help prevent falls. What can I do on the outside of my home?  Regularly fix the edges of walkways and driveways and fix any cracks.  Remove anything that might make you trip as you walk through a door, such as a raised step or threshold.  Trim any bushes or trees on the path to your home.  Use bright outdoor lighting.  Clear any walking paths of anything that might make someone trip, such as rocks or tools.  Regularly check to see if handrails are loose or broken. Make sure that both sides of any steps have handrails.  Any raised decks and porches should have guardrails on the edges.  Have any leaves, snow, or ice cleared regularly.  Use sand or salt on walking paths during winter.  Clean up any spills in your garage right away. This includes oil or grease spills. What can I do in the bathroom?  Use night lights.  Install grab bars by the toilet and in the tub and shower. Do not use towel bars as grab bars.  Use non-skid mats or decals in the tub or shower.  If you need to sit down in the shower, use a plastic, non-slip stool.  Keep the floor dry. Clean up any water that spills on the floor as soon as it  happens.  Remove soap buildup in the tub or shower regularly.  Attach bath mats securely with double-sided non-slip rug tape.  Do not have throw rugs and other things on the floor that can make you trip. What can I do in the bedroom?  Use night lights.  Make sure that you have a light by your bed that is easy to reach.  Do not use any sheets or blankets that are too big for your bed. They should not hang down onto the floor.  Have a firm chair that has side arms. You can use this for support while you get dressed.  Do not have throw rugs and other things on the floor that can make you trip. What can I do in the kitchen?  Clean up any spills right away.  Avoid walking on wet floors.  Keep items that you use a lot in easy-to-reach places.  If you need to reach something above you, use a strong step stool that has a grab bar.  Keep electrical cords out of the way.  Do not use floor polish or wax that makes floors slippery. If you must use wax, use non-skid floor wax.  Do not have throw rugs and other things on the floor that can  make you trip. What can I do with my stairs?  Do not leave any items on the stairs.  Make sure that there are handrails on both sides of the stairs and use them. Fix handrails that are broken or loose. Make sure that handrails are as long as the stairways.  Check any carpeting to make sure that it is firmly attached to the stairs. Fix any carpet that is loose or worn.  Avoid having throw rugs at the top or bottom of the stairs. If you do have throw rugs, attach them to the floor with carpet tape.  Make sure that you have a light switch at the top of the stairs and the bottom of the stairs. If you do not have them, ask someone to add them for you. What else can I do to help prevent falls?  Wear shoes that:  Do not have high heels.  Have rubber bottoms.  Are comfortable and fit you well.  Are closed at the toe. Do not wear sandals.  If you  use a stepladder:  Make sure that it is fully opened. Do not climb a closed stepladder.  Make sure that both sides of the stepladder are locked into place.  Ask someone to hold it for you, if possible.  Clearly mark and make sure that you can see:  Any grab bars or handrails.  First and last steps.  Where the edge of each step is.  Use tools that help you move around (mobility aids) if they are needed. These include:  Canes.  Walkers.  Scooters.  Crutches.  Turn on the lights when you go into a dark area. Replace any light bulbs as soon as they burn out.  Set up your furniture so you have a clear path. Avoid moving your furniture around.  If any of your floors are uneven, fix them.  If there are any pets around you, be aware of where they are.  Review your medicines with your doctor. Some medicines can make you feel dizzy. This can increase your chance of falling. Ask your doctor what other things that you can do to help prevent falls. This information is not intended to replace advice given to you by your health care provider. Make sure you discuss any questions you have with your health care provider. Document Released: 05/27/2009 Document Revised: 01/06/2016 Document Reviewed: 09/04/2014 Elsevier Interactive Patient Education  2017 [employer].

## 2020-12-30 ENCOUNTER — Ambulatory Visit: Payer: PPO | Admitting: Podiatry

## 2021-01-03 ENCOUNTER — Ambulatory Visit (INDEPENDENT_AMBULATORY_CARE_PROVIDER_SITE_OTHER): Payer: PPO | Admitting: Internal Medicine

## 2021-01-03 ENCOUNTER — Encounter: Payer: Self-pay | Admitting: Internal Medicine

## 2021-01-03 ENCOUNTER — Other Ambulatory Visit: Payer: Self-pay

## 2021-01-03 DIAGNOSIS — M8949 Other hypertrophic osteoarthropathy, multiple sites: Secondary | ICD-10-CM | POA: Diagnosis not present

## 2021-01-03 DIAGNOSIS — M797 Fibromyalgia: Secondary | ICD-10-CM

## 2021-01-03 DIAGNOSIS — I10 Essential (primary) hypertension: Secondary | ICD-10-CM | POA: Diagnosis not present

## 2021-01-03 DIAGNOSIS — I25111 Atherosclerotic heart disease of native coronary artery with angina pectoris with documented spasm: Secondary | ICD-10-CM

## 2021-01-03 DIAGNOSIS — I252 Old myocardial infarction: Secondary | ICD-10-CM

## 2021-01-03 DIAGNOSIS — M159 Polyosteoarthritis, unspecified: Secondary | ICD-10-CM

## 2021-01-03 DIAGNOSIS — Z8673 Personal history of transient ischemic attack (TIA), and cerebral infarction without residual deficits: Secondary | ICD-10-CM | POA: Insufficient documentation

## 2021-01-03 DIAGNOSIS — E782 Mixed hyperlipidemia: Secondary | ICD-10-CM

## 2021-01-03 DIAGNOSIS — F0150 Vascular dementia without behavioral disturbance: Secondary | ICD-10-CM | POA: Diagnosis not present

## 2021-01-03 DIAGNOSIS — G4733 Obstructive sleep apnea (adult) (pediatric): Secondary | ICD-10-CM

## 2021-01-03 NOTE — Assessment & Plan Note (Signed)
Not using CPAP Will monitor 

## 2021-01-03 NOTE — Assessment & Plan Note (Signed)
Moderate cognitive needs, mild functional needs Continue Donezepil and Memantine She will continue to see neurology, will

## 2021-01-03 NOTE — Patient Instructions (Signed)
  Vascular Dementia Dementia is a condition in which a person has problems with thinking, memory, and behavior that are severe enough to interfere with daily life. Vascular dementia is a type of dementia. It results from brain damage that is caused by the brain not getting enough blood. This condition may also be called vascular cognitive impairment. What are the causes? Vascular dementia is caused by conditions that reduce blood flow to the brain. Common causes of this condition include:  Multiple small strokes. These may happen without symptoms (silent stroke).  Major stroke.  Damage to small blood vessels in the brain (cerebral small vessel disease). What increases the risk? The following factors may make you more likely to develop this condition:  Having had a stroke.  Having high blood pressure (hypertension) or high cholesterol.  Having a disease that affects the heart or blood vessels.  Smoking.  Not being active.  Being over age 65.  Having any of these conditions: ? Diabetes. ? Metabolic syndrome. ? Obesity. ? Depression. ? A genetic condition that leads to stroke, such as CADASIL (cerebral autosomal dominant arteriopathy with subcortical infarcts and leukoencephalopathy). What are the signs or symptoms? Symptoms of vascular dementia can vary from one person to another. Symptoms may be mild or severe depending on the amount of damage and which parts of the brain have been affected. Symptoms may begin suddenly or may develop slowly. Mental symptoms may include:  Confusion and memory problems.  Poor attention and concentration.  Trouble understanding speech.  Depression.  Personality changes.  Trouble recognizing familiar people.  Agitation or aggression.  Paranoia or false beliefs (delusions).  Hallucinations. These involve seeing, hearing, tasting, smelling, or feeling things that are not real. Physical symptoms may include:  Weakness.  Poor  balance.  Loss of bladder or bowel control (incontinence).  Unsteady walking (gait).  Speaking problems. Behavioral symptoms may include:  Getting lost in familiar places.  Problems with planning and judgment.  Trouble following instructions.  Social problems.  Emotional outbursts.  Trouble with daily activities and self-care.  Problems handling money. Symptoms may remain stable, or they may get worse over time. Symptoms of vascular dementia may be similar to those of Alzheimer's disease. The two conditions can occur together (mixed dementia). How is this diagnosed? This condition may be diagnosed based on:  Your medical history and a physical exam.  Symptoms or changes that are reported by friends and family.  Lab tests or other tests that check brain and nervous system function. Tests may include: ? Blood tests. ? Brain imaging tests. ? Tests of movement, speech, and other daily activities (neurological exam). ? Tests of memory, thinking, and problem-solving (neuropsychological or neurocognitive testing). There is not a specific test to diagnose vascular dementia. Diagnosis may involve several specialists. These may include:  A health care provider who specializes in the brain and nervous system (neurologist).  A health care provider who specializes in understanding how problems in the brain can alter behavior and cognitive function (neuropsychologist). How is this treated? There is no cure for vascular dementia. Brain damage that has already occurred cannot be reversed. Treatment depends on:  How severe the condition is.  Which parts of your brain have been affected.  Your overall health. Treatment measures aim to:  Treat the underlying cause of vascular dementia and manage risk factors. This may include: ? Controlling blood pressure or lowering cholesterol. ? Treating diabetes. ? Making lifestyle changes, such as quitting smoking or losing weight.  Manage    symptoms.  Prevent further brain damage.  Improve the person's health and quality of life. Treatment for dementia may involve a team of health care providers, including:  A neurologist.  A provider who specializes in disorders of the mind (psychiatrist).  A provider who specializes in helping people learn daily living skills (occupational therapist).  A provider who focuses on speech and language changes (speech pathologist).  A heart specialist (cardiologist).  A provider who helps people learn how to manage physical changes, such as movement and walking (exercise physiologist or physical therapist). Follow these instructions at home: Medicines  Take over-the-counter and prescription medicines only as told by your health care provider.  Use a pill organizer or pill reminder to help you manage your medicines. Lifestyle  Do not use any products that contain nicotine or tobacco, such as cigarettes, e-cigarettes, and chewing tobacco. If you need help quitting, ask your health care provider.  Eat a healthy, balanced diet.  Maintain a healthy weight, or lose weight if needed.  Be physically active as told by your health care provider. General instructions  Work with your health care provider to determine what you need help with and what your safety needs are.  Follow the health care provider's instructions for treating the condition that caused the dementia.  Keep all follow-up visits. This is important. If you are the caregiver: People with vascular dementia may need regular help at home or daily care from a family member or home health care worker. Home care for a person with vascular dementia depends on what caused the condition and how severe the symptoms are. General guidelines for caregivers include:  Help the person with dementia remember people, appointments, and daily activities.  Help the person with dementia manage his or her medicines.  Help family and friends  learn about ways to communicate with the person with dementia.  Create a safe living space to reduce the risk of injury or falls.  Find a support group to help caregivers and family cope with the effects of dementia.   Where to find more information  National Institute of Neurological Disorders and Stroke: www.ninds.nih.gov Contact a health care provider if:  New behavioral problems develop.  Problems with swallowing develop.  Confusion gets worse.  Sleepiness gets worse. Get help right away if:  Loss of consciousness occurs.  There is a sudden loss of speech, balance, or thinking ability.  New numbness or paralysis occurs.  Sudden, severe headache occurs.  Vision is lost or suddenly gets worse in one or both eyes. If you ever feel like the person with dementia may hurt himself or herself or others, or if he or she shares thoughts about taking his or her own life, get help right away. You can go to your nearest emergency department or:  Call your local emergency services (911 in the [place]  Call a suicide crisis helpline, such as the National Suicide Prevention Lifeline at 1-800-273-8255. This is open 24 hours a day in the [place]  Text the Crisis Text Line at 741741 (in the [place] Summary  Vascular dementia is a type of dementia. It results from brain damage that is caused by the brain not getting enough blood.  Vascular dementia is caused by conditions that reduce blood flow to the brain. Common causes of this condition include stroke and damage to small blood vessels in the brain.  Treatment focuses on treating the underlying cause of vascular dementia and managing any risk factors.  People with vascular dementia   may need regular help at home or daily care from a family member or home health care worker.  Contact a health care provider if you or your caregiver notices any new symptoms. This information is not intended to replace advice given to you by your health care  provider. Make sure you discuss any questions you have with your health care provider. Document Revised: 12/15/2019 Document Reviewed: 12/15/2019 Elsevier Patient Education  2021 [employer]  

## 2021-01-03 NOTE — Assessment & Plan Note (Signed)
Mainly cognitive She has residual dizziness, managed with meclizine Continue Fish Oil and Red Yeast Rice She will continue to see neurology, will follow

## 2021-01-03 NOTE — Assessment & Plan Note (Signed)
Continue Fish Oil and Red Yeast Rice She will continue to see cardiology, will follow 

## 2021-01-03 NOTE — Assessment & Plan Note (Signed)
Pain controlled with Duloxetine, Tylenol and Ibuprofen She follows with rheumatology 

## 2021-01-03 NOTE — Progress Notes (Signed)
Subjective:    Patient ID: Kristin [name], female    DOB: 03-Mar-1941, 80 y.o.   MRN: [id number]  HPI  Patient presents the clinic today for follow-up of chronic conditions.  She is establishing care with me today, transferring care from [facility], NP.  HTN: Her BP today is 135/52.  She is taking Diltiazem as prescribed. ECG from 11/2020 reviewed.  She follows with cardiology.  HLD with [name] status post MI/Stroke: Her last LDL was 136, triglycerides 134, 10/2019. She does feel lightheaded all the time, takes  Meclizine as needed. Denies recent falls.  She is taking Fish Oil and Red Yeast Rice OTC.  She is not currently on a statin at this time.  She tries to consume a low-fat diet.  OA/Fibromyalgia: Mainly in her hands, back and knees. Managed on Duloxetine. She takes Tylenol and Ibuprofen OTC with minimal relief of symptoms. She follows with rheumatology.  OSA: She averages 6-7 hours of sleep per night without the use of her CPAP.  There is no sleep study on file.  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 Her husband does feel like this is getting worse. She does not drive. She needs supervision with cooking/chores around the house. She is able to dress, bathe, toilet and feed herself. Managed on Donezepil and Mematamine. She follows with neurology.  Review of Systems      Past Medical History:  Diagnosis Date  . Allergy   . Arthritis    recent falls -aug 2010  . [name] (coronary artery disease)    mi-1996  . Complication of anesthesia    pt states takes a long time to wake her wake up  . 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 ([place]) 08/27/2015  . Cough    no fever   . Degeneration of lumbar or lumbosacral intervertebral disc   . Dizziness    r/t meds  . DVT (deep venous thrombosis) ([place])   . Fibromyalgia   . History of shingles   . [name] (irritable bowel syndrome)    takes OTC Hardin Negus colon health   . Joint pain   . Joint swelling   . Myalgia and myositis, unspecified   . Obstructive sleep apnea (adult) (pediatric)   . Old myocardial infarction 1996  . Osteoarthritis   . Osteoarthrosis, unspecified whether generalized or localized, unspecified site   . Psoriatic arthritis ([place])   . Rheumatoid arthritis ([place])   . Rosacea   . Seasonal allergies   . Stroke [facility])    ischaemic microvascular disease  . 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 Unspecified essential hypertension    takes Diltiazem,Metoprolol,and Losartan daily  . UTI (lower urinary tract infection)    frequent=last one was winter of 2014    Current Outpatient Medications  Medication Sig Dispense Refill  . acetaminophen (TYLENOL) 325 MG tablet Take 650 mg by mouth every 8 (eight) hours as needed (pain).    . ASPIRIN 81 PO Take 1 tablet by mouth daily.    . betamethasone dipropionate (DIPROLENE) 0.05 % ointment APPLY TO AFFECTED AREAS TWICE DAILY AS NEEDED    . Calcium Carbonate-Vitamin D (CALTRATE 600+D PO) Take 1 tablet by mouth daily.     . Cranberry 400 MG TABS Take 1 tablet by mouth daily.    [name] diltiazem (CARDIZEM CD) 240 MG 24 hr capsule Take 1 capsule (240 mg  total) by mouth daily. 90 capsule 1  . donepezil (ARICEPT) 10 MG tablet Take by mouth.    . DULoxetine (CYMBALTA) 20 MG capsule Take 20 mg by mouth daily.    . Lactobacillus Rhamnosus, GG, (CULTURELLE PO) Take 1 capsule by mouth daily.    . meclizine (ANTIVERT) 12.5 MG tablet TAKE 1 TABLET BY MOUTH THREE TIMES DAILYAS NEEDED FOR DIZZINESS 270 tablet 1  . memantine (NAMENDA) 10 MG tablet TAKE 1 TABLET BY MOUTH TWICE DAILY 180 tablet 0  . Multiple Vitamin (MULTIVITAMIN) tablet Take 1 tablet by mouth daily.    . nitroGLYCERIN (NITROSTAT) 0.4 MG SL tablet Place 1 tablet (0.4 mg total) under the tongue every 5 (five) minutes as needed for chest pain. 25 tablet 3  . Omega-3 Fatty Acids (FISH OIL) 1200 MG CAPS Take  1 capsule by mouth daily.     Vladimir Faster Glycol-Propyl Glycol 0.4-0.3 % SOLN Apply 1 drop to eye daily as needed (dry eyes).    . potassium chloride SA (KLOR-CON) 20 MEQ tablet Take 2 tablets (40 mEq total) by mouth every Monday, Wednesday, and Friday AND 1 tablet (20 mEq total) every Tuesday, Thursday, Saturday, and Sunday. 43 tablet 0  . Red Yeast Rice 600 MG CAPS Take 2 capsules by mouth daily. (Patient not taking: Reported on 12/28/2020)    . traMADol (ULTRAM) 50 MG tablet TAKE ONE TABLET (50 MG) BY MOUTH EVERY 12 HOURS AS NEEDED FOR MODERATE PAIN 45 tablet 0   No current facility-administered medications for this visit.    Allergies  Allergen Reactions  . Angiotensin Receptor Blockers Anaphylaxis    Fatigue & Dizziness  . Other Anaphylaxis    Other reaction(s): Other (See Comments) Dizziness, rash Fatigue & Dizziness  . Hydralazine Other (See Comments)    Unknown  . Latex     REACTION: Rash, blisters, breathing probs.  . Losartan     Dizziness   . Methotrexate Derivatives     Dizziness, rash   . Spironolactone     Dizziness   . Penicillins Hives and Rash    Family History  Problem Relation Age of Onset  . Colon cancer Father   . Heart attack Mother   . Coronary artery disease Mother   . Osteoporosis Mother   . Heart disease Mother   . Heart failure Mother   . GER disease Sister   . Skin cancer Sister   . Hypertension Sister   . Osteopenia Sister   . GER disease Sister   . Breast cancer Paternal Aunt        age 5  . Heart failure Maternal Grandmother   . Heart failure Maternal Grandfather     Social History   Socioeconomic History  . Marital status: Married    Spouse name: Darnell Level  . Number of children: 2  . Years of education: BSN  . Highest education level: Not on file  Occupational History  . 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 Smoking status: Never Smoker  .  Smokeless tobacco: Never Used  Vaping Use  . Vaping Use: Never used  Substance and Sexual Activity  . Alcohol use: No    Alcohol/week: 0.0 standard drinks    Comment: Rare  . Drug use: No  . Sexual activity: Not Currently    Birth control/protection: Surgical    Comment: 1st intercourse 24 yo-2 partners  Other Topics  Concern  . Not on file  Social History Narrative   12/29/19 lives with husband   No regular exercise-used to do [place] will.. She is DNR, [name] is husband [name] , then son [name]       10/22/12-cardiac work up found the reason for her dizziness,  she needs no neurological follow up for that.  her enthusiasm for CPAP remains limited and is explained by her resiudal high AHI after she finally reached compliance ,[place] 13 points.     95% pressure is 11.7 cm , AHI  20 .7- she does not benefit from CPAP . She is allowed to d/c the machine and we need to concerntrate on her dizziness and facial right droop. ordered MRI brain repeat.    Eye exam next week. PT at [facility]. MRI brain in April- per patients request order next month.          Social Determinants of Health   Financial Resource Strain: Low Risk   . Difficulty of Paying Living Expenses: Not hard at all  Food Insecurity: No Food Insecurity  . Worried About Charity fundraiser in the Last Year: Never true  . Ran Out of Food in the Last Year: Never true  Transportation Needs: No Transportation Needs  . Lack of Transportation (Medical): No  . Lack of Transportation (Non-Medical): No  Physical Activity: Insufficiently Active  . Days of Exercise per Week: 3 days  . Minutes of Exercise per Session: 20 min  Stress: No Stress Concern Present  . Feeling of Stress : Not at all  Social Connections: Not on file  Intimate Partner Violence: Not on file     Constitutional: Denies fever, malaise, fatigue, headache or abrupt weight changes.  HEENT: Denies eye pain, eye redness,  ear pain, ringing in the ears, wax buildup, runny nose, nasal congestion, bloody nose, or sore throat. Respiratory: Denies difficulty breathing, shortness of breath, cough or sputum production.   Cardiovascular: Pt reports swelling in legs. Denies chest pain, chest tightness, palpitations or swelling in the hands. Gastrointestinal: Denies abdominal pain, bloating, constipation, diarrhea or blood in the stool.  GU: Denies urgency, frequency, pain with urination, burning sensation, blood in urine, odor or discharge. Musculoskeletal: Patient reports chronic joint and muscle pain.  Denies decrease in range of motion, difficulty with gait, or joint swelling.  Skin: Denies redness, rashes, lesions or ulcercations.  Neurological: Patient reports difficulty with memory, chronic dizziness.  Denies difficulty with speech or problems with balance and coordination.  Psych: Denies anxiety, depression, SI/HI.  No other specific complaints in a complete review of systems (except as listed in HPI above).  Objective:   Physical Exam  BP (!) 135/52 (BP Location: Right Arm, Patient Position: Sitting, Cuff Size: Normal)   Pulse 65   Temp 97.9 F (36.6 C) (Temporal)   Resp 18   Ht 5\' 7"  (1.702 m)   Wt 123 lb 6.4 oz (56 kg)   LMP 08/14/1989   SpO2 100%   BMI 19.33 kg/m   Wt Readings from Last 3 Encounters:  12/28/20 125 lb (56.7 kg)  11/29/20 128 lb (58.1 kg)  07/12/20 127 lb 12.8 oz (58 kg)    General: Appears her stated age, well developed, well nourished in NAD. Skin: Warm, dry and intact.  HEENT: Head: normal shape and size; Eyes: sclera white and EOMs intact;  Neck:  Neck supple, trachea midline. No  masses, lumps or thyromegaly present.  Cardiovascular: Normal rate and rhythm. S1,S2 noted.  No murmur, rubs or gallops noted. 1+ BLE edema. No carotid bruits noted. Pulmonary/Chest: Normal effort and positive vesicular breath sounds. No respiratory distress. No wheezes, rales or ronchi noted.   Abdomen: Normal bowel sounds.  Musculoskeletal: Gait slow and steady without device. Neurological: Alert and oriented to person and place. Difficulty with recall. Psychiatric: Mood and affect normal. Behavior is normal. Judgment and thought content normal.     BMET    Component Value Date/Time   NA 142 12/24/2020 0746   NA 136 12/29/2016 1036   K 3.3 (L) 12/24/2020 0746   CL 105 12/24/2020 0746   CO2 25 12/24/2020 0746   GLUCOSE 83 12/24/2020 0746   BUN 19 12/24/2020 0746   BUN 58 (H) 12/29/2016 1036   CREATININE 1.16 (H) 12/24/2020 0746   CREATININE 1.21 (H) 11/03/2019 0941   CALCIUM 9.6 12/24/2020 0746   GFRNONAA 48 (L) 12/24/2020 0746   GFRNONAA 43 (L) 11/03/2019 0941   GFRAA 49 (L) 11/03/2019 0941    Lipid Panel     Component Value Date/Time   CHOL 227 (H) 11/03/2019 0941   TRIG 134 11/03/2019 0941   HDL 65 11/03/2019 0941   CHOLHDL 3.5 11/03/2019 0941   VLDL 21.2 02/21/2018 1035   LDLCALC 136 (H) 11/03/2019 0941    CBC    Component Value Date/Time   WBC 7.4 11/03/2019 0941   RBC 3.83 11/03/2019 0941   HGB 12.1 11/03/2019 0941   HGB 12.1 03/22/2009 1331   HCT 36.8 11/03/2019 0941   HCT 36.1 03/22/2009 1331   PLT 258 11/03/2019 0941   PLT 257 03/22/2009 1331   MCV 96.1 11/03/2019 0941   MCV 97.5 03/22/2009 1331   MCH 31.6 11/03/2019 0941   MCHC 32.9 11/03/2019 0941   RDW 13.7 11/03/2019 0941   RDW 15.9 (H) 03/22/2009 1331   LYMPHSABS 1,177 11/03/2019 0941   LYMPHSABS 1.6 03/22/2009 1331   MONOABS 0.7 06/14/2018 1320   MONOABS 0.6 03/22/2009 1331   EOSABS 370 11/03/2019 0941   EOSABS 0.1 03/22/2009 1331   BASOSABS 81 11/03/2019 0941   BASOSABS 0.1 03/22/2009 1331    Hgb A1C No results found for: HGBA1C         Assessment & Plan:     Webb Silversmith, NP This visit occurred during the SARS-CoV-2 public health emergency.  Safety protocols were in place, including screening questions prior to the visit, additional usage of staff PPE, and  extensive cleaning of exam room while observing appropriate contact time as indicated for disinfecting solutions.

## 2021-01-03 NOTE — Assessment & Plan Note (Signed)
Pain controlled with Duloxetine, Tylenol and Ibuprofen She follows with rheumatology

## 2021-01-03 NOTE — Assessment & Plan Note (Signed)
Controlled on Diltiazem Will monitor

## 2021-01-03 NOTE — Assessment & Plan Note (Signed)
Continue Fish Oil and Red Yeast Rice She will continue to see cardiology, will follow

## 2021-01-07 ENCOUNTER — Telehealth: Payer: Self-pay | Admitting: *Deleted

## 2021-01-07 ENCOUNTER — Other Ambulatory Visit
Admission: RE | Admit: 2021-01-07 | Discharge: 2021-01-07 | Disposition: A | Discharge status: Home / Self Care | Payer: PPO | Attending: Family | Admitting: Family

## 2021-01-07 DIAGNOSIS — E876 Hypokalemia: Secondary | ICD-10-CM | POA: Insufficient documentation

## 2021-01-07 DIAGNOSIS — Z79899 Other long term (current) drug therapy: Secondary | ICD-10-CM | POA: Diagnosis not present

## 2021-01-07 DIAGNOSIS — I25111 Atherosclerotic heart disease of native coronary artery with angina pectoris with documented spasm: Secondary | ICD-10-CM | POA: Diagnosis not present

## 2021-01-07 LAB — BASIC METABOLIC PANEL
Anion gap: 10 (ref 5–15)
BUN: 19 mg/dL (ref 8–23)
CO2: 25 mmol/L (ref 22–32)
Calcium: 9.2 mg/dL (ref 8.9–10.3)
Chloride: 102 mmol/L (ref 98–111)
Creatinine, Ser: 1.16 mg/dL — ABNORMAL HIGH (ref 0.44–1.00)
GFR, Estimated: 48 mL/min — ABNORMAL LOW (ref >60)
Glucose, Bld: 97 mg/dL (ref 70–99)
Potassium: 3.6 mmol/L (ref 3.5–5.1)
Sodium: 137 mmol/L (ref 135–145)

## 2021-01-07 NOTE — Telephone Encounter (Signed)
-----   Message from Loel Dubonnet, NP sent at 01/07/2021  8:26 AM EDT ----- Stable kidney function. Normal electrolytes. Continue potassium 20 mEq daily. Good result! Recommend repeat BMP in 1 month for monitoring.

## 2021-01-07 NOTE — Telephone Encounter (Signed)
Spoke to pt. Notified of results and provider's recc.  Pt reports chronic diarrhea since starting Potassium and states she does not want to continue taking.  I did advise that her recent result was 3.6 and low normal with history of low potassium.  Pt states she would like to incorporate potassium rich foods in diet, but refuses to continue Potassium d/t side effects.  Please advise.

## 2021-01-07 NOTE — Telephone Encounter (Signed)
Would recommend sooner repeat potassium for recheck. Please ask her to have BMP, magnesium at [facility] in 1 week. Often diarrhea with potassium supplementation can be resolved by taking the medication with food if she is willing to try that.   Loel Dubonnet, NP

## 2021-01-07 NOTE — Telephone Encounter (Signed)
Notified pt and husband of provider recc.  Pt continues to refuse to take Potassium supplement, states she has tried with food and is not willing to take any further.  Spoke in length with pt regarding the importance of using supplementation to bring potassium to safe levels.  Pt voiced understanding and does agree to have labs [facility] BMET and [facility]  Does continue to refuse potassium supplementation.  Lab orders placed.

## 2021-01-08 NOTE — Telephone Encounter (Signed)
Noted. Thank you for the update.   Loel Dubonnet, NP

## 2021-01-12 ENCOUNTER — Ambulatory Visit (INDEPENDENT_AMBULATORY_CARE_PROVIDER_SITE_OTHER): Payer: PPO | Admitting: Pharmacist

## 2021-01-12 DIAGNOSIS — I10 Essential (primary) hypertension: Secondary | ICD-10-CM

## 2021-01-13 ENCOUNTER — Telehealth: Payer: PPO | Admitting: General Practice

## 2021-01-13 ENCOUNTER — Ambulatory Visit: Payer: Self-pay

## 2021-01-13 DIAGNOSIS — F0150 Vascular dementia without behavioral disturbance: Secondary | ICD-10-CM

## 2021-01-13 DIAGNOSIS — I10 Essential (primary) hypertension: Secondary | ICD-10-CM

## 2021-01-13 NOTE — Patient Instructions (Signed)
Visit Information  PATIENT GOALS: Goals Addressed              This Visit's Progress   .  COMPLETED: Increase physical activity        Starting 02/19/2017, I will start doing floor exercises daily.     .  COMPLETED: Patient Stated        Starting 02/21/2018, I will continue to take medications as prescribed.     .  COMPLETED: RNCM: pt:"I get dizzy and loopy a lot" (pt-stated)        CARE PLAN ENTRY (see longitudinal plan of care for additional care plan information) Closing this care plan.  Opening a new ELS Current Barriers:  [name] Knowledge Deficits related to resources in the community to help in the home during the day while the patient's husband runs errands and does volunteer work . Care Coordination needs related to resources for services to help  in a patient with memory loss and dizziness  (disease states) . Chronic Disease Management support and education needs related to memory loss and mental capacity functions . Lacks caregiver support.   Nurse Case Manager Clinical Goal(s):  [name] Over the next 120 days, patient will verbalize understanding of plan for finding resources in the community to help with patient needs when the patient's husband is running errands or doing volunteer work . Over the next 120 days, patient will work with [facility] team and pcp to address needs related to patients needs related to decline in mental capacity and memory loss . Over the next 120 days, patient will work with care guides (community agency) to have available resources to use in the community to assist with the needs of the patient with memory loss  Interventions:  . Inter-disciplinary care team collaboration (see longitudinal plan of care) . Advised patient to call the pcp for changes in memory or worsening s/s of dizziness  . Provided education to patient re: working with the patient and her husband to help with resources for assistance in the home when her husband is running errands or doing  volunteer work.  07-30-2020: The patient and husband have resources but the patient is not interested at this time in going to places during the day.  . Evaluation of current routine. The patient states that she does well right now when her husband is out running errands. The patient likes to knit and do crafts. The patient does have issues with memory loss and dizziness but endorses being safe at home and no falls. 04-08-2020:  Per the patients husband the patients memory is worse since the last call. The dizziness she is having is still there but some days are worse than others. The patients husband states they are following the recommendations of the pcp but she is still complaining of dizziness with no relation to time or particular events. She is drinking plenty of fluids such as water and Gatorade and eating well.  The patient has had no falls per the husband. The patient is still working with her crafts and this she enjoys. They did go and [facility], the husband liked the facility but the patient refused to try it out. He has the number on hand to call in the future if needed.  Did discuss the likelihood of the patients memory continuing to decline and the concern for safety. The husband verbalized understanding.The husband still is able to do his volunteer work at this time. The patients husband denies any needs  at this time.  07-29-2020: The patient says the dizziness seems to be better today. Has a visit with the pcp on 07-26-2020 and the pcp is referring the patient to neurology.  They have not heard from neurology yet but know referral has been placed.   Nash Dimmer with CCM team, pcp and care guides regarding available resources to help in the home or day cares in the area to help with patient with decline in memory.  04-08-2020: Resources are available but the patient is declining at this time. The patients husband knows the CCM team and pcp are here to support the patient and him.   . Discussed plans with patient for ongoing care management follow up and provided patient with direct contact information for care management team . Care Guide referral for resources in the community to help with adult day care resources or in home services to help when the patients husband is out doing errands or volunteer work.  . Discussed self care of the patients husband. He states that he is doing well and taking care of himself. He still is able to get out and do his volunteer work and this helps him a lot.   Patient Self Care Activities:  . Patient verbalizes understanding of plan to have CCM team work with her and her husband to help find resources in the community to provide adult day care options or in home day care help . Attends all scheduled provider appointments . Performs ADL's independently . Calls provider office for new concerns or questions . Unable to independently manage care during the day when the patients husband is out running errands or doing volunteer work as evidence of patient having dizziness and memory loss.   Please see past updates related to this goal by clicking on the "Past Updates" button in the selected goal      .  COMPLETED: RNCM:"I don't check my blood pressure a lot" (pt-stated)        CARE PLAN ENTRY (see longtitudinal plan of care for additional care plan information) Closing this care plan.  Opening a new ELS Current Barriers:  . Chronic Disease Management support, education, and care coordination needs related to HTN and HLD  Clinical Goal(s) related to HTN and HLD:  Over the next 120 days, patient will:  . Work with the care management team to address educational, disease management, and care coordination needs  . Begin or continue self health monitoring activities as directed today Measure and record blood pressure 2 times per week and adhere to a heart healthy diet . Call provider office for new or worsened signs and symptoms Blood pressure  findings outside established parameters and New or worsened symptom related to HTN/HLD and other chronic conditions . Call care management team with questions or concerns . Verbalize basic understanding of patient centered plan of care established today  Interventions related to HTN and HLD:  . Evaluation of current treatment plans and patient's adherence to plan as established by provider.  07-29-2020: Consistently taking blood pressures at this time. The patient states that it is stable. The patients husband endorses following recommendations by the provider.  . Assessed patient understanding of disease states.  Per the husband he and the patient understand her conditions well. They check her blood pressure at home and it is good. 06-03-2020: The patients husband gave blood pressure readings for last several days:  05-23-2020: 129/64 and 134/65, 05-26-2020 130/67, 06-02-2020 139/62.  The patient has switched from Amlodipine  to HCTZ 12.5m.  The patient states she is still having swelling in her ankles. Is going to start using ted hose.  Will collaborate with the pcp concerning continued swelling in ankles. Have sent an in basket message for the front office to call and get a follow up appointment for the patient. 07-29-2020: States the swelling in feet is better. The patient verbalized she is doing well except fo the back and knee pain. She is using tramadol but with little relief. Education on using heat. The patients husband states they will try that. The patient states she has tried than and it does not work. Empathetic listening and support given.  . Assessed patient's education and care coordination needs.  Per the husband there have been no new changes in the patients condition. Ask if they had talked with the cardiologist about adding a statin to her medication therapy and the husband states they have not. Will continue to follow up in subsequent calls.  . Provided disease specific education to  patient.  06-03-2020: The patient is elevating legs when sitting and is going to use ted hose to help with the edema. The patient states that her ankles have been more swollen lately. 07-29-2020: Education on calling the pcp for changes in level or intensity of pain. The patient states that she will notify the pcp for changes or unresolved pain.  .Nash Dimmerwith appropriate clinical care team members regarding patient needs. . Evaluation of upcoming appointments.  The patient has no upcoming appointments to see the pcp. Knows to call for changes in condition. Has RNCM number also for changes in condition.   Patient Self Care Activities related to HTN and HLD:  . Patient is unable to independently self-manage chronic health conditions  Please see past updates related to this goal by clicking on the "Past Updates" button in the selected goal        Patient Care Plan: PharmD - Medication Mgmt    Problem Identified: Disease Progression     Long-Range Goal: Disease Progression Prevented or Minimized   Start Date: 09/29/2020  Expected End Date: 12/28/2020  This Visit's Progress: On track  Recent Progress: On track  Priority: High  Note:   Current Barriers:  . 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   Interventions: . 1:1 collaboration with KOlin Hauser DO regarding development and update of comprehensive plan of care as evidenced by provider attestation and co-signature . Inter-disciplinary care team collaboration (see longitudinal plan of care) . Perform chart review.  o [facility] - Provider advised patient to try diclofenac 1% gel  [facility] follow up of degenerative joint disease o Patient completed lab work on 5/4. Low potassium noted. Kidney function slight decline compared to previous. [facility] contacted and patient advised to: - -STOP HCTZ - -START Potassium 40 MEQ bid x2 days. Then reduce Potassium to 40 MEQ daily - -Repeat bmp on 12/17/20 o Per telephone note from 5/6, patient advised to continue to stay off HCTZ, to start Potassium 212m daily and repeat BMET lab in 1  week o Per lab result note on 5/13, patient advised to take Potassium 40 mEq Monday, Wednesday, and Friday then take Potassium 20 mEq on all other days with repeat labs in 1-2 weeks o Per telephone note from 5/27, patient advised to continue potassium 20 mEq daily. Patient stated she would like to incorporate potassium rich foods in diet, but refused to continue Potassium d/t side effects (chronic diarrhea). Cardiologist advised patient to return for BMET and Magnesium labs in 1 week . Comprehensive medication review performed; medication list updated in electronic medical record . Today patient/husband report patient stopped taking potassium supplement ~1 week ago, but that diarrhea has continued. Reports patient having ~1-2 loose stools/day o Reports diarrhea controlled by patient taking OTC Imodium as needed per package labeling o Report diarrhea started ~4 weeks ago - From review of current medication list, note donepezil may cause diarrhea as a dose-related side-effect. Also, red yeast rice, cranberry and fish oil supplements may cause diarrhea side effect . Donepezil dose increased to 10 mg nightly following Office Visit with Neurology on 3/22. Patient/caregiver deny side effects starting at that time. . Other than the potassium supplement, patient/caregiver deny medication, dietary, supplement  changes ~4 weeks ago - Advise to schedule follow up with PCP regarding ongoing diarrhea. Patient/caregiver agree to schedule follow up if loose stools continue . Report patient eating potassium rich foods at home including bananas, beets, potatoes and spinach . Confirm will return for labs on Friday  Hypertension . Current treatment:  o Diltiazem CD 240 mg once daily o Confirms patient not currently taking either HCTZ or amlodipine . Denies monitoring home BP recently . Encourage patient and caregiver to restart check home BP, keep log, and bring to medical appointments .   Patient Goals/Self-Care Activities . Over the next 90 days, patient/spouse will:  - check blood pressure, document, and provide at future appointments - attend all scheduled provider appointments with help of spouse  Follow Up Plan: Telephone follow up appointment with care management team member scheduled for:7/6 at 2:30 pm    Patient Care Plan: RNCM Dementia (Adult)    Problem Identified: [facility] Dementia - Cognitive Function   Priority: Medium    Long-Range Goal: RNCM - Dementia-Optimal Cognitive Function   Priority: Medium  Note:   Current Barriers:   Ineffective Self Health Maintenance   Unable to independently effectively manage dementia  Unable to self administer medications as prescribed  Does not adhere to provider recommendations related to taking Potassium as ordered  Does not attend all scheduled provider appointments  Does not adhere to prescribed medication regimen  Lacks social connections  Unable to perform IADLs independently  Does not maintain contact with provider office  Does not contact provider office for questions/concerns Clinical Goal(s):  [name] Collaboration with [name], NP regarding development and update of comprehensive plan of care as evidenced by provider attestation and co-signature . Inter-disciplinary care team collaboration (see longitudinal plan of  care)  patient will work with care management team to address care coordination and chronic disease management needs related to Disease Management  Educational Needs  Medication Management and Education  Caregiver Stress support  Dementia and Caregiver Support   Interventions:   Evaluation of current treatment plan related to Dementia, Limited social support, ADL IADL limitations, Limited education about dementia progression*, Cognitive Deficits, Memory Deficits, and Inability to perform IADL's independently self-management and patient's adherence to plan as established by provider.  Collaboration with [name], NP regarding development and  update of comprehensive plan of care as evidenced by provider attestation       and co-signature  Inter-disciplinary care team collaboration (see longitudinal plan of care)  Discussed plans with patient for ongoing care management follow up and provided patient with direct contact information for care management team Self Care Activities:  . Attends all scheduled provider appointments . Calls pharmacy for medication refills . Performs ADL's independently . Calls provider office for new concerns or questions Patient Goals:- cognition assessed - cognitive-stimulating activities promoted - medication list reviewed - medication side effects managed - symptom review completed   Follow Up Plan: Telephone follow up appointment with care management team member scheduled for: 04/11/2021 at 1:45pm   Task: RNCM Facilitate Optimal Mental Processes   Note:   Care Management Activities:    - cognition assessed - cognitive-stimulating activities promoted - medication list reviewed - medication side effects managed - symptom review completed    Notes:    Patient Care Plan: RNCM Hypertension (Adult)    Problem Identified: RNCM Hypertension (Hypertension)   Priority: Medium    Long-Range Goal: RNCM Hypertension Monitored   Priority: Medium   Note:   Objective:  . Last practice recorded BP readings:  . BP Readings from Last 3 Encounters: .  01/03/21 . (!) 135/52 .  11/29/20 . 140/64 .  07/12/20 . (!) 123/45 .    [name] Most recent eGFR/CrCl: No results found for: EGFR  No components found for: CRCL Current Barriers:  [name] Knowledge Deficits related to basic understanding of hypertension pathophysiology and self care management . Knowledge Deficits related to understanding of medications prescribed for management of hypertension . Non-adherence to prescribed medication regimen . Non-adherence to scheduled provider appointments . Cognitive Deficits . Limited Social Support . Unable to independently effectively manage hypertension . Unable to self administer medications as prescribed . Does not attend all scheduled provider appointments . Does not adhere to prescribed medication regimen . Lacks social connections . Unable to perform IADLs independently . Does not maintain contact with provider office . Does not contact provider office for questions/concerns Case Manager Clinical Goal(s):  . patient will verbalize understanding of plan for hypertension management . patient will attend all scheduled medical appointments: with PCP and cardiologist . patient will demonstrate improved adherence to prescribed treatment plan for hypertension as evidenced by taking all medications as prescribed, monitoring and recording blood pressure as directed, adhering to low sodium/DASH diet . patient will demonstrate improved health management independence as evidenced by checking blood pressure as directed and notifying PCP if SBP>150 or DBP > 90, taking all medications as prescribe, and adhering to a low sodium diet as discussed. Interventions:  . Collaboration with [name], NP regarding development and update of comprehensive plan of care as evidenced by provider attestation and co-signature . Inter-disciplinary care team collaboration (see  longitudinal plan of care) . UNABLE to independently:effetive manage hypertension . Evaluation of current treatment plan related to hypertension self management and patient's adherence to plan as established by provider. . Provided education to patient re: stroke prevention, s/s of heart attack and stroke, DASH diet, complications of uncontrolled blood pressure . Reviewed medications with patient and discussed importance of compliance . Discussed plans with patient for ongoing care management follow up and provided patient with direct contact information for care management team . Advised patient, providing education and rationale, to monitor blood pressure daily and record, calling PCP for findings outside established parameters.  . Reviewed scheduled/upcoming provider appointments including:  does not have any scheduled appointment with PCP.  Will contact office for any further needs.  Offered her an appointment but patient declined. Self-Care Activities: - Attends all scheduled provider appointments Calls provider office for new concerns, questions, or BP outside discussed parameters Checks BP and records as discussed Follows a low sodium diet/DASH diet Patient Goals: - check blood pressure weekly - choose a place to take my blood pressure (home, clinic or office, retail store) - write blood pressure results in a log or diary - agree to work together to make changes - ask questions to understand - blood pressure trends reviewed - home or ambulatory blood pressure monitoring encouraged  Follow Up Plan: Telephone follow up appointment with care management team member scheduled for: 04/11/2021 at 1:45pm    Task: RNCM Identify and Monitor Blood Pressure Elevation   Note:   Care Management Activities:    - blood pressure trends reviewed - home or ambulatory blood pressure monitoring encouraged    Notes:      Patient verbalizes understanding of instructions provided today and agrees to  view in [place].   Telephone follow up appointment with care management team member scheduled for: 04/11/2021 at 1:45pm  Salvatore Marvel RN, [facility] Mobile: [phone]

## 2021-01-13 NOTE — Chronic Care Management (AMB) (Signed)
Chronic Care Management Pharmacy Note  01/12/2021 Name:  Kristin [name] MRN:  [id number] DOB:  December 09, 1940   Subjective: Kristin [name] is an 80 y.o. year old female who is a primary patient of [name], NP.  The CCM team was consulted for assistance with disease management and care coordination needs.    Engaged with patient and husband by telephone for follow up visit in response to provider referral for pharmacy case management and/or care coordination services.   Consent to Services:  The patient was given information about Chronic Care Management services, agreed to services, and gave verbal consent prior to initiation of services.  Please see initial visit note for detailed documentation.   Patient Care Team: [name], NP as PCP - General (Internal Medicine) Wellington Hampshire, MD as PCP - Cardiology (Cardiology) Wellington Hampshire, MD as Consulting Physician (Cardiology) Birder Robson, MD as Referring Physician (Ophthalmology) Oneta Rack, MD as Consulting Physician (Dermatology) Valinda Party, MD as Consulting Physician (Rheumatology) Vicenta Aly, [place] (Chiropractic Medicine) [employer], Virl Diamond, [place] as Pharmacist Hall Busing Nobie Putnam, RN as Case Manager (General Practice)  Recent office visits: Office Visit with PCP [facility] Sil[facility]mith on 5/23  Recent consult visits: Office Visit with [facility] on 4/27 Office Visit with [facility] visits: None in previous 6 months  Objective:  Lab Results  Component Value Date   CREATININE 1.16 (H) 01/07/2021   CREATININE 1.16 (H) 12/24/2020   CREATININE 1.19 (H) 47/82/9562   Last metabolic panel Lab Results  Component Value Date   GLUCOSE 97 01/07/2021   NA 137 01/07/2021   K 3.6 01/07/2021   CL 102 01/07/2021   CO2 25 01/07/2021   BUN 19 01/07/2021   CREATININE 1.16 (H) 01/07/2021   GFRNONAA 48 (L) 01/07/2021   GFRAA 49  (L) 11/03/2019   CALCIUM 9.2 01/07/2021   PROT 6.8 11/03/2019   ALBUMIN 3.9 02/21/2018   BILITOT 0.4 11/03/2019   ALKPHOS 67 02/21/2018   AST 21 11/03/2019   ALT 14 11/03/2019   ANIONGAP 10 01/07/2021     Clinical ASCVD: Yes  The ASCVD Risk score Mikey Bussing DC Jr., et al., 2013) failed to calculate for the following reasons:   The 2013 ASCVD risk score is only valid for ages 21 to 80   The patient has a prior MI or stroke diagnosis     Social History   Tobacco Use  Smoking Status Never Smoker  Smokeless Tobacco Never Used   BP Readings from Last 3 Encounters:  01/03/21 (!) 135/52  11/29/20 140/64  07/12/20 (!) 123/45   Pulse Readings from Last 3 Encounters:  01/03/21 65  11/29/20 61  07/12/20 65   Wt Readings from Last 3 Encounters:  01/03/21 123 lb 6.4 oz (56 kg)  12/28/20 125 lb (56.7 kg)  11/29/20 128 lb (58.1 kg)    Assessment: Review of patient past medical history, allergies, medications, health status, including review of consultants reports, laboratory and other test data, was performed as part of comprehensive evaluation and provision of chronic care management services.   SDOH:  (Social Determinants of Health) assessments and interventions performed: none   CCM Care Plan  Allergies  Allergen Reactions  . Angiotensin Receptor Blockers Anaphylaxis    Fatigue & Dizziness  . Other Anaphylaxis    Other reaction(s): Other (See Comments) Dizziness, rash Fatigue & Dizziness  . Hydralazine Other (See Comments)  Unknown  . Latex     REACTION: Rash, blisters, breathing probs.  . Losartan     Dizziness   . Methotrexate Derivatives     Dizziness, rash   . Spironolactone     Dizziness   . Penicillins Hives and Rash    Medications Reviewed Today    Reviewed by Vella Raring, RPH-CPP (Pharmacist) on 01/12/21 at 1533  Med List Status: <None>  Medication Order Taking? Sig Documenting Provider Last Dose Status Informant  acetaminophen (TYLENOL) 325  MG tablet [id number] Yes Take 650 mg by mouth every 8 (eight) hours as needed (pain). [provider] Taking Active Self  ASPIRIN 81 PO [id number] Yes Take 1 tablet by mouth daily. [provider] Taking Active Self  betamethasone dipropionate (DIPROLENE) 0.05 % ointment [id number]  APPLY TO AFFECTED AREAS TWICE DAILY AS NEEDED [provider]  Active   Calcium Carbonate-Vitamin D (CALTRATE 600+D PO) [id number] Yes Take 1 tablet by mouth daily.  [provider] Taking Active Self  Cranberry 400 MG TABS [id number]  Take 1 tablet by mouth daily. [provider]  Active   diltiazem (CARDIZEM CD) 240 MG 24 hr capsule [id number] Yes Take 1 capsule (240 mg total) by mouth daily. Loel Dubonnet, NP Taking Active   donepezil (ARICEPT) 10 MG tablet [id number] Yes Take by mouth. [provider] Taking Active   DULoxetine (CYMBALTA) 20 MG capsule [id number]  Take 20 mg by mouth daily. [provider]  Active   Lactobacillus Rhamnosus, GG, (CULTURELLE PO) [id number] Yes Take 1 capsule by mouth daily. [provider] Taking Active   meclizine (ANTIVERT) 12.5 MG tablet [id number]  TAKE 1 TABLET BY MOUTH THREE TIMES DAILYAS NEEDED FOR DIZZINESS Verl Bangs, FNP  Active   memantine (NAMENDA) 10 MG tablet [id number] Yes TAKE 1 TABLET BY MOUTH TWICE DAILY Baity, Coralie Keens, NP Taking Active   Multiple Vitamin (MULTIVITAMIN) tablet [id number] Yes Take 1 tablet by mouth daily. [provider] Taking Active Self  Omega-3 Fatty Acids (FISH OIL) 1200 MG CAPS [id number] Yes Take 1 capsule by mouth daily.  [provider] Taking Active Self  Polyethyl Glycol-Propyl Glycol 0.4-0.3 % SOLN [id number]  Apply 1 drop to eye daily as needed (dry eyes). [provider]  Active Self  potassium chloride SA (KLOR-CON) 20 MEQ tablet [id number] No Take 2 tablets (40 mEq total) by mouth every Monday, Wednesday, and Friday AND 1 tablet (20 mEq total) every  Tuesday, Thursday, Saturday, and Sunday.  Patient not taking: Reported on 01/12/2021   Loel Dubonnet, NP Not Taking Active   Red Yeast Rice 600 MG CAPS [id number] Yes Take 2 capsules by mouth daily. [provider] Taking Active           Patient Active Problem List   Diagnosis Date Noted  . History of stroke 01/03/2021  . Vascular dementia ([place]) 01/03/2021  . 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 Osteoarthritis 08/10/2011  . MYOCARDIAL INFARCTION, HX OF 08/01/2010  . Fibromyalgia 05/20/2008  . Hyperlipidemia 05/29/2007  . OBSTRUCTIVE SLEEP APNEA 05/29/2007  . Essential hypertension, benign 05/29/2007    Immunization History  Administered Date(s) Administered  . Fluad Quad(high Dose 65+) 06/03/2019, 06/01/2020  . Influenza Split 05/16/2012  . Influenza Whole 05/15/2007, 05/20/2008, 05/14/2009, 05/14/2010  . Influenza, High Dose Seasonal PF 05/11/2018  . Influenza,inj,Quad PF,6+ Mos 05/13/2016, 06/12/2017  . Influenza-Unspecified 05/14/2013, 06/06/2014  . PFIZER(Purple Top)SARS-COV-2 Vaccination  09/10/2019, 10/01/2019  . Pneumococcal Conjugate-13 10/17/2013  . Pneumococcal Polysaccharide-23 08/14/2005, 02/19/2017  . Td 08/14/2002  . Zoster, Live 08/14/2005, 05/18/2008    Conditions to be addressed/monitored: HTN, HLD, dementia  Care Plan : PharmD - Medication Mgmt  Updates made by Vella Raring, RPH-CPP since 01/13/2021 12:00 AM    Problem: Disease Progression     Long-Range Goal: Disease Progression Prevented or Minimized   Start Date: 09/29/2020  Expected End Date: 12/28/2020  This Visit's Progress: On track  Recent Progress: On track  Priority: High  Note:   Current Barriers:  . Chronic Disease Management support, education, and care coordination needs related to HTN, [name], PVC, MI in 1996 thought secondary to coronary spasm (per Cardiology notes) and memory  loss  Pharmacist Clinical Goal(s):  [name] Over the next 90 days, patient will achieve adherence to monitoring guidelines and medication adherence to achieve therapeutic efficacy through collaboration with PharmD and provider.   Interventions: . 1:1 collaboration with Olin Hauser, DO regarding development and update of comprehensive plan of care as evidenced by provider attestation and co-signature . Inter-disciplinary care team collaboration (see longitudinal plan of care) . Perform chart review.  o Patient seen for Office Visit with [facility] Medicine [facility] Rehabilitation on 4/27 - Provider advised patient to try diclofenac 1% gel  o Office Visit with [facility] Rheumatology on 5/2 for follow up of degenerative joint disease o Patient completed lab work on 5/4. Low potassium noted. Kidney function slight decline compared to previous. [facility] contacted and patient advised to: - -STOP HCTZ - -START Potassium 40 MEQ bid x2 days. Then reduce Potassium to 40 MEQ daily - -Repeat bmp on 12/17/20 o Per telephone note from 5/6, patient advised to continue to stay off HCTZ, to start Potassium 42mEq daily and repeat BMET lab in 1 week o Per lab result note on 5/13, patient advised to take Potassium 40 mEq Monday, Wednesday, and Friday then take Potassium 20 mEq on all other days with repeat labs in 1-2 weeks o Per telephone note from 5/27, patient advised to continue potassium 20 mEq daily. Patient stated she would like to incorporate potassium rich foods in diet, but refused to continue Potassium d/t side effects (chronic diarrhea). Cardiologist advised patient to return for BMET and Magnesium labs in 1 week . Comprehensive medication review performed; medication list updated in electronic medical record . Today patient/husband report patient stopped taking potassium supplement ~1 week ago, but that diarrhea has continued. Reports patient having ~1-2 loose  stools/day o Reports diarrhea controlled by patient taking OTC Imodium as needed per package labeling o Report diarrhea started ~4 weeks ago - From review of current medication list, note donepezil may cause diarrhea as a dose-related side-effect. Also, red yeast rice, cranberry and fish oil supplements may cause diarrhea side effect . Donepezil dose increased to 10 mg nightly following Office Visit with Neurology on 3/22. Patient/caregiver deny side effects starting at that time. . Other than the potassium supplement, patient/caregiver deny medication, dietary, supplement changes ~4 weeks ago - Advise to schedule follow up with PCP regarding ongoing diarrhea. Patient/caregiver agree to schedule follow up if loose stools continue . Report patient eating potassium rich foods at home including bananas, beets, potatoes and spinach . Confirm will return for labs on Friday  Hypertension . Current treatment:  o Diltiazem CD 240 mg once daily o Confirms patient not currently taking either HCTZ or amlodipine . Denies monitoring home BP recently . Encourage  patient and caregiver to restart check home BP, keep log, and bring to medical appointments .   Patient Goals/Self-Care Activities . Over the next 90 days, patient/spouse will:  - check blood pressure, document, and provide at future appointments - attend all scheduled provider appointments with help of spouse  Follow Up Plan: Telephone follow up appointment with care management team member scheduled for:7/6 at 2:30 pm      Patient's preferred pharmacy is:  [facility] [place] [facility] [address] [address] Phone: [phone] Fax: [phone]  Uses pill box? Yes   Follow Up:  Patient agrees to Care Plan and Follow-up.  Harlow Asa, PharmD, Para [place], CPP Clinical Pharmacist [facility] [phone]

## 2021-01-13 NOTE — Chronic Care Management (AMB) (Signed)
Chronic Care Management   CCM RN Visit Note  01/13/2021 Name: Kristin [name] MRN: [id number] DOB: Apr 16, 1941  Subjective: Kristin [name] is a 80 y.o. year old female who is a primary care patient of [name], NP. The care management team was consulted for assistance with disease management and care coordination needs.    Engaged with patient by telephone for follow up visit in response to provider referral for case management and/or care coordination services.   Consent to Services:  The patient was given information about Chronic Care Management services, agreed to services, and gave verbal consent prior to initiation of services.  Please see initial visit note for detailed documentation.   Patient agreed to services and verbal consent obtained.   Assessment: Review of patient past medical history, allergies, medications, health status, including review of consultants reports, laboratory and other test data, was performed as part of comprehensive evaluation and provision of chronic care management services.   SDOH (Social Determinants of Health) assessments and interventions performed:    CCM Care Plan  Allergies  Allergen Reactions  . Angiotensin Receptor Blockers Anaphylaxis    Fatigue & Dizziness  . Other Anaphylaxis    Other reaction(s): Other (See Comments) Dizziness, rash Fatigue & Dizziness  . Hydralazine Other (See Comments)    Unknown  . Latex     REACTION: Rash, blisters, breathing probs.  . Losartan     Dizziness   . Methotrexate Derivatives     Dizziness, rash   . Spironolactone     Dizziness   . Penicillins Hives and Rash    Outpatient Encounter Medications as of 01/13/2021  Medication Sig  . acetaminophen (TYLENOL) 325 MG tablet Take 650 mg by mouth every 8 (eight) hours as needed (pain).  . ASPIRIN 81 PO Take 1 tablet by mouth daily.  . betamethasone dipropionate (DIPROLENE) 0.05 % ointment APPLY TO AFFECTED AREAS TWICE DAILY AS NEEDED  .  Calcium Carbonate-Vitamin D (CALTRATE 600+D PO) Take 1 tablet by mouth daily.   . Cranberry 400 MG TABS Take 1 tablet by mouth daily.  [name] diltiazem (CARDIZEM CD) 240 MG 24 hr capsule Take 1 capsule (240 mg total) by mouth daily.  [name] donepezil (ARICEPT) 10 MG tablet Take by mouth.  . DULoxetine (CYMBALTA) 20 MG capsule Take 20 mg by mouth daily.  . Lactobacillus Rhamnosus, GG, (CULTURELLE PO) Take 1 capsule by mouth daily.  . meclizine (ANTIVERT) 12.5 MG tablet TAKE 1 TABLET BY MOUTH THREE TIMES DAILYAS NEEDED FOR DIZZINESS  . memantine (NAMENDA) 10 MG tablet TAKE 1 TABLET BY MOUTH TWICE DAILY  . Multiple Vitamin (MULTIVITAMIN) tablet Take 1 tablet by mouth daily.  . Omega-3 Fatty Acids (FISH OIL) 1200 MG CAPS Take 1 capsule by mouth daily.   Vladimir Faster Glycol-Propyl Glycol 0.4-0.3 % SOLN Apply 1 drop to eye daily as needed (dry eyes).  . potassium chloride SA (KLOR-CON) 20 MEQ tablet Take 2 tablets (40 mEq total) by mouth every Monday, Wednesday, and Friday AND 1 tablet (20 mEq total) every Tuesday, Thursday, Saturday, and Sunday. (Patient not taking: Reported on 01/12/2021)  . Red Yeast Rice 600 MG CAPS Take 2 capsules by mouth daily.   No facility-administered encounter medications on file as of 01/13/2021.    Patient Active Problem List   Diagnosis Date Noted  . History of stroke 01/03/2021  . Vascular dementia ([place]) 01/03/2021  . 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  Osteoarthritis 08/10/2011  . MYOCARDIAL INFARCTION, HX OF 08/01/2010  . Fibromyalgia 05/20/2008  . Hyperlipidemia 05/29/2007  . OBSTRUCTIVE SLEEP APNEA 05/29/2007  . Essential hypertension, benign 05/29/2007    Conditions to be addressed/monitored:HTN and Dementia  Care Plan : RNCM Dementia (Adult)  Updates made by Inge Rise, RN since 01/13/2021 12:00 AM    Problem: [facility] Dementia - Cognitive Function   Priority: Medium     Long-Range Goal: RNCM - Dementia-Optimal Cognitive Function   Priority: Medium  Note:   Current Barriers:   Ineffective Self Health Maintenance   Unable to independently effectively manage dementia  Unable to self administer medications as prescribed  Does not adhere to provider recommendations related to taking Potassium as ordered  Does not attend all scheduled provider appointments  Does not adhere to prescribed medication regimen  Lacks social connections  Unable to perform IADLs independently  Does not maintain contact with provider office  Does not contact provider office for questions/concerns Clinical Goal(s):  [name] Collaboration with [name], NP regarding development and update of comprehensive plan of care as evidenced by provider attestation and co-signature . Inter-disciplinary care team collaboration (see longitudinal plan of care)  patient will work with care management team to address care coordination and chronic disease management needs related to Disease Management  Educational Needs  Medication Management and Education  Caregiver Stress support  Dementia and Caregiver Support   Interventions:   Evaluation of current treatment plan related to Dementia, Limited social support, ADL IADL limitations, Limited education about dementia progression*, Cognitive Deficits, Memory Deficits, and Inability to perform IADL's independently self-management and patient's adherence to plan as established by provider.  Collaboration with [name], NP regarding development and update of comprehensive plan of care as evidenced by provider attestation       and co-signature  Inter-disciplinary care team collaboration (see longitudinal plan of care)  Discussed plans with patient for ongoing care management follow up and provided patient with direct contact information for care management team Self Care Activities:  . Attends all scheduled provider  appointments . Calls pharmacy for medication refills . Performs ADL's independently . Calls provider office for new concerns or questions Patient Goals:- cognition assessed - cognitive-stimulating activities promoted - medication list reviewed - medication side effects managed - symptom review completed   Follow Up Plan: Telephone follow up appointment with care management team member scheduled for: 04/11/2021 at 1:45pm   Task: RNCM Facilitate Optimal Mental Processes   Note:   Care Management Activities:    - cognition assessed - cognitive-stimulating activities promoted - medication list reviewed - medication side effects managed - symptom review completed    Notes:    Care Plan : RNCM Hypertension (Adult)  Updates made by Inge Rise, RN since 01/13/2021 12:00 AM    Problem: RNCM Hypertension (Hypertension)   Priority: Medium    Long-Range Goal: RNCM Hypertension Monitored   Priority: Medium  Note:   Objective:  . Last practice recorded BP readings:  . BP Readings from Last 3 Encounters: .  01/03/21 . (!) 135/52 .  11/29/20 . 140/64 .  07/12/20 . (!) 123/45 .    [name] Most recent eGFR/CrCl: No results found for: EGFR  No components found for: CRCL Current Barriers:  [name] Knowledge Deficits related to basic understanding of hypertension pathophysiology and self care management . Knowledge Deficits related to understanding of medications prescribed for management of hypertension . Non-adherence to prescribed medication regimen . Non-adherence to scheduled  provider appointments . Cognitive Deficits . Limited Social Support . Unable to independently effectively manage hypertension . Unable to self administer medications as prescribed . Does not attend all scheduled provider appointments . Does not adhere to prescribed medication regimen . Lacks social connections . Unable to perform IADLs independently . Does not maintain contact with provider office . Does not  contact provider office for questions/concerns Case Manager Clinical Goal(s):  . patient will verbalize understanding of plan for hypertension management . patient will attend all scheduled medical appointments: with PCP and cardiologist . patient will demonstrate improved adherence to prescribed treatment plan for hypertension as evidenced by taking all medications as prescribed, monitoring and recording blood pressure as directed, adhering to low sodium/DASH diet . patient will demonstrate improved health management independence as evidenced by checking blood pressure as directed and notifying PCP if SBP>150 or DBP > 90, taking all medications as prescribe, and adhering to a low sodium diet as discussed. Interventions:  . Collaboration with [name], NP regarding development and update of comprehensive plan of care as evidenced by provider attestation and co-signature . Inter-disciplinary care team collaboration (see longitudinal plan of care) . UNABLE to independently:effetive manage hypertension . Evaluation of current treatment plan related to hypertension self management and patient's adherence to plan as established by provider. . Provided education to patient re: stroke prevention, s/s of heart attack and stroke, DASH diet, complications of uncontrolled blood pressure . Reviewed medications with patient and discussed importance of compliance . Discussed plans with patient for ongoing care management follow up and provided patient with direct contact information for care management team . Advised patient, providing education and rationale, to monitor blood pressure daily and record, calling PCP for findings outside established parameters.  . Reviewed scheduled/upcoming provider appointments including: does not have any scheduled appointment with PCP.  Will contact office for any further needs.  Offered her an appointment but patient declined. Self-Care Activities: - Attends all  scheduled provider appointments Calls provider office for new concerns, questions, or BP outside discussed parameters Checks BP and records as discussed Follows a low sodium diet/DASH diet Patient Goals: - check blood pressure weekly - choose a place to take my blood pressure (home, clinic or office, retail store) - write blood pressure results in a log or diary - agree to work together to make changes - ask questions to understand - blood pressure trends reviewed - home or ambulatory blood pressure monitoring encouraged  Follow Up Plan: Telephone follow up appointment with care management team member scheduled for: 04/11/2021 at 1:45pm    Task: RNCM Identify and Monitor Blood Pressure Elevation   Note:   Care Management Activities:    - blood pressure trends reviewed - home or ambulatory blood pressure monitoring encouraged    Notes:      Plan:Telephone follow up appointment with care management team member scheduled for:  04/11/2021 at 1:45pm Salvatore Marvel RN, [place] Mobile: [phone]

## 2021-01-13 NOTE — Patient Instructions (Signed)
Visit Information  PATIENT GOALS: Goals Addressed            This Visit's Progress   . Pharmacy goals       Please check blood pressure at home, keep log, and bring to medical appointments  Feel free to call me with any questions or concerns. I look forward to our next call!   Harlow Asa, PharmD, Para [place], CPP Clinical Pharmacist [facility] [phone]        The patient verbalized understanding of instructions, educational materials, and care plan provided today and declined offer to receive copy of patient instructions, educational materials, and care plan.   Telephone follow up appointment with care management team member scheduled for: 7/6 at 2:30 pm

## 2021-01-14 ENCOUNTER — Other Ambulatory Visit
Admission: RE | Admit: 2021-01-14 | Discharge: 2021-01-14 | Disposition: A | Discharge status: Home / Self Care | Payer: PPO | Source: Ambulatory Visit | Attending: Family | Admitting: Family

## 2021-01-14 DIAGNOSIS — Z79899 Other long term (current) drug therapy: Secondary | ICD-10-CM | POA: Insufficient documentation

## 2021-01-14 DIAGNOSIS — E876 Hypokalemia: Secondary | ICD-10-CM | POA: Diagnosis not present

## 2021-01-14 LAB — BASIC METABOLIC PANEL
Anion gap: 10 (ref 5–15)
BUN: 18 mg/dL (ref 8–23)
CO2: 27 mmol/L (ref 22–32)
Calcium: 9.3 mg/dL (ref 8.9–10.3)
Chloride: 102 mmol/L (ref 98–111)
Creatinine, Ser: 1.2 mg/dL — ABNORMAL HIGH (ref 0.44–1.00)
GFR, Estimated: 46 mL/min — ABNORMAL LOW (ref >60)
Glucose, Bld: 112 mg/dL — ABNORMAL HIGH (ref 70–99)
Potassium: 3.9 mmol/L (ref 3.5–5.1)
Sodium: 139 mmol/L (ref 135–145)

## 2021-01-14 LAB — MAGNESIUM: Magnesium: 2.1 mg/dL (ref 1.7–2.4)

## 2021-01-14 NOTE — Telephone Encounter (Signed)
-----   Message from Loel Dubonnet, NP sent at 01/14/2021  8:59 AM EDT ----- Kidney function remains mildly decreased suggestive of dehydration. Per note from CCM yesterday she has continued to have diarrhea and should follow up with PCP. Potassium normal - continue diet of potassium rich  foods. Okay to remain off potassium supplement.

## 2021-01-14 NOTE — Telephone Encounter (Signed)
Attempted to call pt/husband back on both numbers listed.  No answer. Lmtcb.

## 2021-01-17 ENCOUNTER — Telehealth: Payer: PPO | Admitting: Internal Medicine

## 2021-01-17 NOTE — Telephone Encounter (Signed)
Attempted to call pt/husband again on both numbers listed.  No answer. Notified of complete result note on vm (DPR approved).  Asked to call back with any further questions.

## 2021-01-17 NOTE — Progress Notes (Deleted)
Virtual Visit via Telephone Note  I connected with Kristin [name] on 01/17/21 at 11:20 AM EDT by telephone and verified that I am speaking with the correct person using two identifiers.  Location: Patient: Home Provider: Office  Person's participating in this telephone call: Webb Silversmith, NP-C and Kristin [name].   I discussed the limitations, risks, security and privacy concerns of performing an evaluation and management service by telephone and the availability of in person appointments. I also discussed with the patient that there may be a patient responsible charge related to this service. The patient expressed understanding and agreed to proceed.   History of Present Illness:  Pt reports diarrhea.   Past Medical History:  Diagnosis Date  . Allergy   . Arthritis    recent falls -aug 2010  . [name] (coronary artery disease)    mi-1996  . Complication of anesthesia    pt states takes a long time to wake her wake up  . 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 Cough    no fever   . Degeneration of lumbar or lumbosacral intervertebral disc   . Dizziness    r/t meds  . DVT (deep venous thrombosis) ([place])   . Fibromyalgia   . History of shingles   . [name] (irritable bowel syndrome)    takes OTC Hardin Negus colon health  . Joint pain   . Joint swelling   . Myalgia and myositis, unspecified   . Obstructive sleep apnea (adult) (pediatric)   . Old myocardial infarction 1996  . Osteoarthritis   . Osteoarthrosis, unspecified whether generalized or localized, unspecified site   . Psoriatic arthritis ([place])   . Rheumatoid arthritis ([place])   . Rosacea   . Seasonal allergies   . Stroke [facility])    ischaemic microvascular disease  . 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 Unspecified essential hypertension    takes Diltiazem,Metoprolol,and Losartan daily  . UTI  (lower urinary tract infection)    frequent=last one was winter of 2014    Current Outpatient Medications  Medication Sig Dispense Refill  . acetaminophen (TYLENOL) 325 MG tablet Take 650 mg by mouth every 8 (eight) hours as needed (pain).    . ASPIRIN 81 PO Take 1 tablet by mouth daily.    . betamethasone dipropionate (DIPROLENE) 0.05 % ointment APPLY TO AFFECTED AREAS TWICE DAILY AS NEEDED    . Calcium Carbonate-Vitamin D (CALTRATE 600+D PO) Take 1 tablet by mouth daily.     . Cranberry 400 MG TABS Take 1 tablet by mouth daily.    [name] diltiazem (CARDIZEM CD) 240 MG 24 hr capsule Take 1 capsule (240 mg total) by mouth daily. 90 capsule 1  . donepezil (ARICEPT) 10 MG tablet Take by mouth.    . DULoxetine (CYMBALTA) 20 MG capsule Take 20 mg by mouth daily.    . Lactobacillus Rhamnosus, GG, (CULTURELLE PO) Take 1 capsule by mouth daily.    . meclizine (ANTIVERT) 12.5 MG tablet TAKE 1 TABLET BY MOUTH THREE TIMES DAILYAS NEEDED FOR DIZZINESS 270 tablet 1  . memantine (NAMENDA) 10 MG tablet TAKE 1 TABLET BY MOUTH TWICE DAILY 180 tablet 0  . Multiple Vitamin (MULTIVITAMIN) tablet Take 1 tablet by mouth daily.    . Omega-3 Fatty Acids (FISH OIL) 1200 MG CAPS Take 1 capsule by mouth daily.     Vladimir Faster Glycol-Propyl Glycol  0.4-0.3 % SOLN Apply 1 drop to eye daily as needed (dry eyes).    . potassium chloride SA (KLOR-CON) 20 MEQ tablet Take 2 tablets (40 mEq total) by mouth every Monday, Wednesday, and Friday AND 1 tablet (20 mEq total) every Tuesday, Thursday, Saturday, and "Sunday. (Patient not taking: Reported on 01/12/2021) 43 tablet 0  . Red Yeast Rice 600 MG CAPS Take 2 capsules by mouth daily.     No current facility-administered medications for this visit.    Allergies  Allergen Reactions  . Angiotensin Receptor Blockers Anaphylaxis    Fatigue & Dizziness  . Other Anaphylaxis    Other reaction(s): Other (See Comments) Dizziness, rash Fatigue & Dizziness  . Hydralazine Other (See  Comments)    Unknown  . Latex     REACTION: Rash, blisters, breathing probs.  . Losartan     Dizziness   . Methotrexate Derivatives     Dizziness, rash   . Spironolactone     Dizziness   . Penicillins Hives and Rash    Family History  Problem Relation Age of Onset  . Colon cancer Father   . Heart attack Mother   . Coronary artery disease Mother   . Osteoporosis Mother   . Heart disease Mother   . Heart failure Mother   . GER disease Sister   . Skin cancer Sister   . Hypertension Sister   . Osteopenia Sister   . GER disease Sister   . Breast cancer Paternal Aunt        age 32  . Heart failure Maternal Grandmother   . Heart failure Maternal Grandfather     Social History   Socioeconomic History  . Marital status: Married    Spouse name: Kristin  . Number of children: 2  . Years of education: BSN  . Highest education level: Not on file  Occupational History  . 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 Smoking status: Never Smoker  . Smokeless tobacco: Never Used  Vaping Use  . Vaping Use: Never used  Substance and Sexual Activity  . Alcohol use: No    Alcohol/week: 0.0 standard drinks    Comment: Rare  . Drug use: No  . Sexual activity: Not Currently    Birth control/protection: Surgical    Comment: 1st intercourse 18 yo-2 partners  Other Topics Concern  . Not on file  Social History Narrative   12/29/19 lives with husband   No regular exercise-used to do Yoga      [place] diet      Living will.. She is DNR, HCPOA is husband Kristin [name] , then son Kristin [name]       10/22/12-cardiac work up found the reason for her dizziness,  she needs no neurological follow up for that.  her enthusiasm for CPAP remains limited and is explained by her resiudal high AHI after she finally reached compliance ,[place] 13 points.     95" % pressure is 11.7 cm , AHI  20 .7- she does not benefit from  CPAP . She is allowed to d/c the machine and we need to concerntrate on her dizziness and facial right droop. ordered MRI brain repeat.    Eye exam next week. PT at [facility]. MRI brain in April- per patients request order next month.          Social Determinants of Health   Financial  Resource Strain: Low Risk   . Difficulty of Paying Living Expenses: Not hard at all  Food Insecurity: No Food Insecurity  . Worried About Charity fundraiser in the Last Year: Never true  . Ran Out of Food in the Last Year: Never true  Transportation Needs: No Transportation Needs  . Lack of Transportation (Medical): No  . Lack of Transportation (Non-Medical): No  Physical Activity: Insufficiently Active  . Days of Exercise per Week: 3 days  . Minutes of Exercise per Session: 20 min  Stress: No Stress Concern Present  . Feeling of Stress : Not at all  Social Connections: Not on file  Intimate Partner Violence: Not on file     Constitutional: Denies fever, malaise, fatigue, headache or abrupt weight changes.  HEENT: Denies eye pain, eye redness, ear pain, ringing in the ears, wax buildup, runny nose, nasal congestion, bloody nose, or sore throat. Respiratory: Denies difficulty breathing, shortness of breath, cough or sputum production.   Cardiovascular: Denies chest pain, chest tightness, palpitations or swelling in the hands or feet.  Gastrointestinal: Pt reports diarrhea. Denies abdominal pain, bloating, constipation, or blood in the stool.  GU: Denies urgency, frequency, pain with urination, burning sensation, blood in urine, odor or discharge. Musculoskeletal: Denies decrease in range of motion, difficulty with gait, muscle pain or joint pain and swelling.  Skin: Denies redness, rashes, lesions or ulcercations.  Neurological: Denies dizziness, difficulty with memory, difficulty with speech or problems with balance and coordination.  Psych: Denies anxiety, depression, SI/HI.  No other specific  complaints in a complete review of systems (except as listed in HPI above).    Observations/Objective:  LMP 08/14/1989  Wt Readings from Last 3 Encounters:  01/03/21 123 lb 6.4 oz (56 kg)  12/28/20 125 lb (56.7 kg)  11/29/20 128 lb (58.1 kg)    General: In NAD. Pulmonary/Chest: Normal effort. No respiratory distress.  Neurological: Alert and oriented.    BMET    Component Value Date/Time   NA 139 01/14/2021 0829   NA 136 12/29/2016 1036   K 3.9 01/14/2021 0829   CL 102 01/14/2021 0829   CO2 27 01/14/2021 0829   GLUCOSE 112 (H) 01/14/2021 0829   BUN 18 01/14/2021 0829   BUN 58 (H) 12/29/2016 1036   CREATININE 1.20 (H) 01/14/2021 0829   CREATININE 1.21 (H) 11/03/2019 0941   CALCIUM 9.3 01/14/2021 0829   GFRNONAA 46 (L) 01/14/2021 0829   GFRNONAA 43 (L) 11/03/2019 0941   GFRAA 49 (L) 11/03/2019 0941    Lipid Panel     Component Value Date/Time   CHOL 227 (H) 11/03/2019 0941   TRIG 134 11/03/2019 0941   HDL 65 11/03/2019 0941   CHOLHDL 3.5 11/03/2019 0941   VLDL 21.2 02/21/2018 1035   LDLCALC 136 (H) 11/03/2019 0941    CBC    Component Value Date/Time   WBC 7.4 11/03/2019 0941   RBC 3.83 11/03/2019 0941   HGB 12.1 11/03/2019 0941   HGB 12.1 03/22/2009 1331   HCT 36.8 11/03/2019 0941   HCT 36.1 03/22/2009 1331   PLT 258 11/03/2019 0941   PLT 257 03/22/2009 1331   MCV 96.1 11/03/2019 0941   MCV 97.5 03/22/2009 1331   MCH 31.6 11/03/2019 0941   MCHC 32.9 11/03/2019 0941   RDW 13.7 11/03/2019 0941   RDW 15.9 (H) 03/22/2009 1331   LYMPHSABS 1,177 11/03/2019 0941   LYMPHSABS 1.6 03/22/2009 1331   MONOABS 0.7 06/14/2018 1320   MONOABS 0.6 03/22/2009  1331   EOSABS 370 11/03/2019 0941   EOSABS 0.1 03/22/2009 1331   BASOSABS 81 11/03/2019 0941   BASOSABS 0.1 03/22/2009 1331    Hgb A1C No results found for: HGBA1C     Assessment and Plan:   Follow Up Instructions:    I discussed the assessment and treatment plan with the patient. The patient was  provided an opportunity to ask questions and all were answered. The patient agreed with the plan and demonstrated an understanding of the instructions.   The patient was advised to call back or seek an in-person evaluation if the symptoms worsen or if the condition fails to improve as anticipated.  I provided *** minutes of non-face-to-face time during this encounter.   Webb Silversmith, NP

## 2021-01-20 ENCOUNTER — Encounter: Payer: Self-pay | Admitting: Podiatry

## 2021-01-20 ENCOUNTER — Ambulatory Visit (INDEPENDENT_AMBULATORY_CARE_PROVIDER_SITE_OTHER): Payer: PPO

## 2021-01-20 ENCOUNTER — Ambulatory Visit: Payer: PPO | Admitting: Podiatry

## 2021-01-20 ENCOUNTER — Other Ambulatory Visit: Payer: Self-pay

## 2021-01-20 DIAGNOSIS — L84 Corns and callosities: Secondary | ICD-10-CM | POA: Diagnosis not present

## 2021-01-20 DIAGNOSIS — S99922A Unspecified injury of left foot, initial encounter: Secondary | ICD-10-CM

## 2021-01-20 DIAGNOSIS — B351 Tinea unguium: Secondary | ICD-10-CM

## 2021-01-20 DIAGNOSIS — R58 Hemorrhage, not elsewhere classified: Secondary | ICD-10-CM | POA: Diagnosis not present

## 2021-01-20 DIAGNOSIS — M79676 Pain in unspecified toe(s): Secondary | ICD-10-CM

## 2021-01-20 DIAGNOSIS — M0579 Rheumatoid arthritis with rheumatoid factor of multiple sites without organ or systems involvement: Secondary | ICD-10-CM

## 2021-01-20 NOTE — Progress Notes (Signed)
This patient returns to my office for at risk foot care.  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  This patient is unable to cut nails herself since the patient cannot reach her nails.These nails are painful walking and wearing shoes. Patient also has significant discoloration at the base of her toes   She says she is forgetful and does not remembetr injuring her left foot.This patient presents for at risk foot care today.  General Appearance  Alert, conversant and in no acute stress.  Vascular  Dorsalis pedis and posterior tibial  pulses are palpable  bilaterally.  Capillary return is within normal limits  bilaterally. Temperature is within normal limits  bilaterally.  Neurologic  Senn-Weinstein monofilament wire test within normal limits  bilaterally. Muscle power within normal limits bilaterally.  Nails Thick disfigured discolored nails with subungual debris  from hallux to fifth toes bilaterally. No evidence of bacterial infection or drainage bilaterally.  Orthopedic  No limitations of motion  feet .  No crepitus or effusions noted.  No bony pathology or digital deformities noted. 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  Hammer toes second  B/L.  Palpable pain over 2,3,4 MPJ left and 2,3,4 metatatsal shafts left foot.  Skin  normotropic skin with no porokeratosis noted bilaterally.  No signs of infections or ulcers noted.   Ecchymosis at dorsal base digits 2-4 left foot.  Ecchymosis at bunion right foot.  Onychomycosis  Pain in right toes  Pain in left toes  Consent was obtained for treatment procedures.   Mechanical debridement of nails 1-5  bilaterally performed with a nail nipper.  Filed with dremel without incident.  Patient was worked up for possible foot injury left foot.    She had ecchymosis dorsum left foot with localized increased temperature.  Pain over metatarsals left foot.  Xray reveal severe arthritis 1st MPJ left  foot.  Possible fx 4th met head left foot.  She also has severe arthritis so it was difficult to determine fx.  Dispense surgical shoe left.  Told to return in 4 weeks for left foot evaluation.  RTC 3 months for nail care.   Return office visit   3 months                   Told patient to return for periodic foot care and evaluation due to potential at risk complications.   Gardiner Barefoot DPM

## 2021-02-07 DIAGNOSIS — G479 Sleep disorder, unspecified: Secondary | ICD-10-CM | POA: Diagnosis not present

## 2021-02-07 DIAGNOSIS — G8929 Other chronic pain: Secondary | ICD-10-CM | POA: Diagnosis not present

## 2021-02-07 DIAGNOSIS — R2681 Unsteadiness on feet: Secondary | ICD-10-CM | POA: Diagnosis not present

## 2021-02-07 DIAGNOSIS — R262 Difficulty in walking, not elsewhere classified: Secondary | ICD-10-CM | POA: Diagnosis not present

## 2021-02-07 DIAGNOSIS — R413 Other amnesia: Secondary | ICD-10-CM | POA: Diagnosis not present

## 2021-02-07 DIAGNOSIS — R42 Dizziness and giddiness: Secondary | ICD-10-CM | POA: Diagnosis not present

## 2021-02-07 DIAGNOSIS — M545 Low back pain, unspecified: Secondary | ICD-10-CM | POA: Diagnosis not present

## 2021-02-16 ENCOUNTER — Ambulatory Visit (INDEPENDENT_AMBULATORY_CARE_PROVIDER_SITE_OTHER): Payer: PPO | Admitting: Pharmacist

## 2021-02-16 DIAGNOSIS — F0150 Vascular dementia without behavioral disturbance: Secondary | ICD-10-CM | POA: Diagnosis not present

## 2021-02-16 DIAGNOSIS — I10 Essential (primary) hypertension: Secondary | ICD-10-CM

## 2021-02-16 NOTE — Chronic Care Management (AMB) (Addendum)
Chronic Care Management Pharmacy Note  02/16/2021 Name:  Kristin [name] MRN:  [id number] DOB:  04-28-1941  Subjective: Kristin [name] is an 80 y.o. year old female who is a primary patient of [name], NP.  The CCM team was consulted for assistance with disease management and care coordination needs.    Engaged with patient by telephone for follow up visit in response to provider referral for pharmacy case management and/or care coordination services.   Consent to Services:  The patient was given information about Chronic Care Management services, agreed to services, and gave verbal consent prior to initiation of services.  Please see initial visit note for detailed documentation.   Patient Care Team: [name], NP as PCP - General (Internal Medicine) Wellington Hampshire, MD as PCP - Cardiology (Cardiology) Wellington Hampshire, MD as Consulting Physician (Cardiology) Birder Robson, MD as Referring Physician (Ophthalmology) Oneta Rack, MD as Consulting Physician (Dermatology) Valinda Party, MD as Consulting Physician (Rheumatology) Vicenta Aly, [place] (Chiropractic Medicine) [place], Virl Diamond, [place] as Pharmacist Hall Busing Nobie Putnam, RN as Case Manager (General Practice)   Recent consult visits: Office Visit with Podiatry on 6/9 Office Visit with [facility] Neurology on 6/27  Hospital visits: None in previous 6 months  Objective:  Lab Results  Component Value Date   CREATININE 1.20 (H) 01/14/2021   CREATININE 1.16 (H) 01/07/2021   CREATININE 1.16 (H) 12/24/2020   Lab Results  Component Value Date   CREATININE 1.20 (H) 01/14/2021   BUN 18 01/14/2021   NA 139 01/14/2021   K 3.9 01/14/2021   CL 102 01/14/2021   CO2 27 01/14/2021    Social History   Tobacco Use  Smoking Status Never  Smokeless Tobacco Never   BP Readings from Last 3 Encounters:  01/03/21 (!) 135/52  11/29/20 140/64  07/12/20 (!) 123/45   Pulse Readings from Last 3  Encounters:  01/03/21 65  11/29/20 61  07/12/20 65   Wt Readings from Last 3 Encounters:  01/03/21 123 lb 6.4 oz (56 kg)  12/28/20 125 lb (56.7 kg)  11/29/20 128 lb (58.1 kg)    Assessment: Review of patient past medical history, allergies, medications, health status, including review of consultants reports, laboratory and other test data, was performed as part of comprehensive evaluation and provision of chronic care management services.   SDOH:  (Social Determinants of Health) assessments and interventions performed: none   CCM Care Plan  Allergies  Allergen Reactions   Angiotensin Receptor Blockers Anaphylaxis    Fatigue & Dizziness   Other Anaphylaxis    Other reaction(s): Other (See Comments) Dizziness, rash Fatigue & Dizziness   Hydralazine Other (See Comments)    Unknown   Latex     REACTION: Rash, blisters, breathing probs.   Losartan     Dizziness    Methotrexate Derivatives     Dizziness, rash    Spironolactone     Dizziness    Penicillins Hives and Rash    Medications Reviewed Today     Reviewed by Vella Raring, RPH-CPP (Pharmacist) on 02/16/21 at [place] List Status: <None>   Medication Order Taking? Sig Documenting Provider Last Dose Status Informant  acetaminophen (TYLENOL) 325 MG tablet [id number] Yes Take 650 mg by mouth every 8 (eight) hours as needed (pain). [provider] Taking Active Self  ASPIRIN 81 PO [id number] Yes Take 1 tablet by mouth daily. [provider] Taking Active Self  betamethasone  dipropionate (DIPROLENE) 0.05 % ointment [id number] Yes APPLY TO AFFECTED AREAS TWICE DAILY AS NEEDED [provider] Taking Active   Calcium Carbonate-Vitamin D (CALTRATE 600+D PO) [id number] Yes Take 1 tablet by mouth daily.  [provider] Taking Active Self  Cranberry 400 MG TABS [id number] Yes Take 1 tablet by mouth daily. [provider] Taking Active   diltiazem (CARDIZEM CD) 240 MG 24 hr capsule  [id number] Yes Take 1 capsule (240 mg total) by mouth daily. Loel Dubonnet, NP Taking Active   donepezil (ARICEPT) 10 MG tablet [id number] Yes Take 5 mg by mouth at bedtime. [provider] Taking Active   DULoxetine (CYMBALTA) 20 MG capsule [id number] Yes Take 1 capsule by mouth 2 (two) times daily. [provider] Taking Active   Lactobacillus Rhamnosus, GG, (CULTURELLE PO) [id number] Yes Take 1 capsule by mouth daily. [provider] Taking Active   meclizine (ANTIVERT) 12.5 MG tablet [id number] Yes TAKE 1 TABLET BY MOUTH THREE TIMES DAILYAS NEEDED FOR DIZZINESS Verl Bangs, FNP Taking Active   memantine (NAMENDA) 10 MG tablet [id number] Yes TAKE 1 TABLET BY MOUTH TWICE DAILY Baity, Coralie Keens, NP Taking Active   Multiple Vitamin (MULTIVITAMIN) tablet [id number] Yes Take 1 tablet by mouth daily. [provider] Taking Active Self  Omega-3 Fatty Acids (FISH OIL) 1200 MG CAPS [id number] Yes Take 1 capsule by mouth daily.  [provider] Taking Active Self  Polyethyl Glycol-Propyl Glycol 0.4-0.3 % SOLN [id number] Yes Apply 1 drop to eye daily as needed (dry eyes). [provider] Taking Active Self  Red Yeast Rice 600 MG CAPS [id number] Yes Take 2 capsules by mouth daily. 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 ([place]) 08/27/2015   Osteoarthritis 08/10/2011   MYOCARDIAL INFARCTION, HX OF 08/01/2010   Fibromyalgia 05/20/2008   Hyperlipidemia 05/29/2007   OBSTRUCTIVE SLEEP APNEA 05/29/2007   Essential hypertension, benign 05/29/2007    Immunization History  Administered Date(s) Administered   Fluad Quad(high Dose 65+) 06/03/2019, 06/01/2020   Influenza Split 05/16/2012   Influenza Whole  05/15/2007, 05/20/2008, 05/14/2009, 05/14/2010   Influenza, High Dose Seasonal PF 05/11/2018   Influenza,inj,Quad PF,6+ Mos 05/13/2016, 06/12/2017   Influenza-Unspecified 05/14/2013, 06/06/2014, 06/14/2020   PFIZER(Purple Top)SARS-COV-2 Vaccination 09/10/2019, 10/01/2019, 05/17/2020   Pneumococcal Conjugate-13 10/17/2013   Pneumococcal Polysaccharide-23 08/14/2005, 02/19/2017   Td 08/14/2002   Zoster, Live 08/14/2005, 05/18/2008    Conditions to be addressed/monitored: HTN, HLD, dementia  Care Plan : PharmD - Medication Mgmt  Updates made by Vella Raring, RPH-CPP since 02/16/2021 12:00 AM     Problem: Disease Progression      Long-Range Goal: Disease Progression Prevented or Minimized   Start Date: 09/29/2020  Expected End Date: 12/28/2020  This Visit's Progress: On track  Recent Progress: On track  Priority: High  Note:   Current Barriers:  Chronic Disease Management support, education, and care coordination needs related to HTN, [name], PVC, 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   Interventions: 1:1 collaboration with  Olin Hauser, DO regarding development and update of comprehensive plan of care as evidenced by provider attestation and co-signature Inter-disciplinary care team collaboration (see longitudinal plan of care) Perform chart review.  Patient completed lab work as scheduled on 6/3.  Cardiologist advised in lab work notation: "Kidney function remains mildly decreased suggestive of dehydration. Per note from CCM yesterday she has continued to have diarrhea and should follow up with PCP. Potassium normal - continue diet of potassium rich  foods. Okay to remain off potassium supplement." Office  Visit with Podiatry on 6/9 Office Visit with [facility] Neurology on 6/27. Provider advised patient to:  Decrease aricept to 5 mg nightly. Break 10 mg tablets in half. Will call in refill of 5 mg tablets. Continue namenda 10 mg twice daily. Increase cymbalta to 20 mg twice daily. Take 1 tablet in the morning and 1 tablet at night. Continue to follow up with physiatry. Today patient and husband report diarrhea significantly improved since Neurologist decreased Aricept back to 5 mg nightly Patient no longer needing Imodium Counsel patient/caregiver to follow up with PCP if diarrhea returns or other new/worsening symptoms Reports has been working on keeping patient hydrated Comprehensive medication review performed; medication list updated in electronic medical record Reports patient has remained off of potassium as directed okay to do so by Cardiology  Hypertension Current treatment:  Diltiazem CD 240 mg once daily Reports last checked BP yesterday: 150/76, HR 65, but reports patient had recently been outside in heat Denies having any other recent readings, but from review of chart note BP: 140/58, HR 64 during Office Visit with Neurology on 6/27 Counsel on BP monitoring technique Encourage patient and caregiver to check home BP, keep log, contact Cardiology for readings outside established parameters and to bring to medical appointments  Patient Goals/Self-Care Activities Over the next 90 days, patient/spouse will:  - check blood pressure, document, and provide at future appointments - attend all scheduled provider appointments with help of spouse  Follow Up Plan: Telephone follow up appointment with care management team member scheduled for: 9/19 at 1 pm       Patient's preferred pharmacy is:  [place], [place] [facility] [address] [place] [address] Phone: [phone] Fax: [phone]  Uses pill box? Yes  Follow Up:  Patient agrees  to Care Plan and Follow-up.  Harlow Asa, PharmD, Para [place], CPP Clinical Pharmacist [facility] [phone]

## 2021-02-16 NOTE — Patient Instructions (Signed)
Visit Information  PATIENT GOALS:  Goals Addressed             This Visit's Progress    Pharmacy goals       Please check blood pressure at home, keep log, and bring to medical appointments  Feel free to call me with any questions or concerns. I look forward to our next call!    Harlow Asa, PharmD, [place], CPP Clinical Pharmacist [facility] [phone]          The patient verbalized understanding of instructions, educational materials, and care plan provided today and declined offer to receive copy of patient instructions, educational materials, and care plan.   Telephone follow up appointment with care management team member scheduled for: 9/19 at 1 pm

## 2021-02-17 ENCOUNTER — Other Ambulatory Visit: Payer: Self-pay

## 2021-02-17 ENCOUNTER — Encounter: Payer: Self-pay | Admitting: Podiatry

## 2021-02-17 ENCOUNTER — Ambulatory Visit (INDEPENDENT_AMBULATORY_CARE_PROVIDER_SITE_OTHER): Payer: PPO

## 2021-02-17 ENCOUNTER — Ambulatory Visit: Payer: PPO | Admitting: Podiatry

## 2021-02-17 DIAGNOSIS — S99922A Unspecified injury of left foot, initial encounter: Secondary | ICD-10-CM

## 2021-02-17 DIAGNOSIS — S99922D Unspecified injury of left foot, subsequent encounter: Secondary | ICD-10-CM | POA: Insufficient documentation

## 2021-02-17 NOTE — Progress Notes (Signed)
This patient returns to the office approximately 4 weeks after being diagnosed with a left foot injury.  X-rays reveal a fracture at the neck of the fourth metatarsal left foot.  She was treated with a surgical shoe and told to ambulate in a surgical shoe until today.  She presents the office stating that she is still having some pain in her foot as well as around her toes.  She says she has been ambulating with a wooden shoe and it has aggravated her back.  She presents the office today for continued evaluation and treatment of her painful left foot.  Vascular  Dorsalis pedis and posterior tibial pulses are palpable  B/L.  Capillary return  WNL.  Temperature gradient is  WNL.  Skin turgor  WNL  Sensorium  Senn Weinstein monofilament wire  WNL. Normal tactile sensation.  Nail Exam  Patient has normal nails with no evidence of bacterial or fungal infection.  Orthopedic  Exam  Muscle tone and muscle strength  WNL.  No limitations of motion feet  B/L.  No crepitus or joint effusion noted.  Foot type is unremarkable and digits show no abnormalities.  Bony prominences are unremarkable.  Hallux limitus first MPJ bilateral.   Swelling and ecchymosis on the dorsum of her left foot has diminished.  She still has severe palpable pain noted at the neck of the fourth metatarsal left foot.  Skin  No open lesions.  Normal skin texture and turgor.  Decreased ecchymosis noted on the dorsum of the left foot.  Left foot injury  Fracture 4th metatarsal left foot.  ROV.  X-rays were taken and revealed healing noted at the fracture site with good alignment of the metatarsal head of the fourth metatarsal left foot.  Patient was dispensed an anklet to help stabilize the foot and diminish the swelling.  Finally she is returning to the office in 4 weeks for continued evaluation and treatment of her left foot.  Gardiner Barefoot DPM

## 2021-03-14 ENCOUNTER — Other Ambulatory Visit: Payer: Self-pay | Admitting: Internal Medicine

## 2021-03-14 DIAGNOSIS — Z1231 Encounter for screening mammogram for malignant neoplasm of breast: Secondary | ICD-10-CM

## 2021-03-17 ENCOUNTER — Ambulatory Visit: Payer: PPO | Admitting: Podiatry

## 2021-03-17 ENCOUNTER — Encounter: Payer: Self-pay | Admitting: Podiatry

## 2021-03-17 ENCOUNTER — Other Ambulatory Visit: Payer: Self-pay

## 2021-03-17 DIAGNOSIS — S99922D Unspecified injury of left foot, subsequent encounter: Secondary | ICD-10-CM

## 2021-03-17 DIAGNOSIS — M7752 Other enthesopathy of left foot: Secondary | ICD-10-CM | POA: Diagnosis not present

## 2021-03-17 DIAGNOSIS — S99922A Unspecified injury of left foot, initial encounter: Secondary | ICD-10-CM

## 2021-03-18 ENCOUNTER — Encounter: Payer: Self-pay | Admitting: Podiatry

## 2021-03-18 NOTE — Progress Notes (Signed)
Subjective:  Patient ID: Kristin [name], female    DOB: 1940-10-06,  MRN: [id number]  Chief Complaint  Patient presents with   Foot Pain    "It hurts still.  He's probably going to do xrays."    80 y.o. female presents with the above complaint.  Patient presents with complaint of left fourth metatarsophalangeal joint capsulitis.  Patient states is painful to touch.  Patient states it painful to ambulate.  She was treated by Dr. Prudence Davidson for foot injury.  She states that the patient still continues to have pain.  She has been immobilized with a cam boot.  She denies any other acute complaints   Review of Systems: Negative except as noted in the HPI. Denies N/V/F/Ch.  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 ([place]) 08/27/2015   Cough    no fever    Degeneration of lumbar or lumbosacral intervertebral disc    Dizziness    r/t meds   DVT (deep venous thrombosis) (HCC)    Fibromyalgia    History of shingles    [name] (irritable bowel syndrome)    takes OTC Hardin Negus colon health   Joint pain    Joint swelling    Myalgia and myositis, unspecified    Obstructive sleep apnea (adult) (pediatric)    Old myocardial infarction 1996   Osteoarthritis    Osteoarthrosis, unspecified whether generalized or localized, unspecified site    Psoriatic arthritis ([place])    Rheumatoid arthritis ([place])    Rosacea    Seasonal allergies    Stroke [facility])    ischaemic microvascular disease   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[place])    1991 , bleed and dizziness    Unspecified essential hypertension    takes Diltiazem,Metoprolol,and Losartan daily   UTI (lower urinary tract infection)    frequent=last one was winter of 2014     Current Outpatient Medications:    acetaminophen (TYLENOL) 325 MG tablet, Take 650 mg by mouth every 8 (eight) hours as needed (pain)., Disp: , Rfl:    ASPIRIN 81 PO, Take 1 tablet by mouth daily., Disp: , Rfl:    betamethasone dipropionate (DIPROLENE) 0.05 % ointment, APPLY TO AFFECTED AREAS TWICE DAILY AS NEEDED, Disp: , Rfl:    Calcium Carbonate-Vitamin D (CALTRATE 600+D PO), Take 1 tablet by mouth daily. , Disp: , Rfl:    Cranberry 400 MG TABS, Take 1 tablet by mouth daily., Disp: , Rfl:    diltiazem (CARDIZEM CD) 240 MG 24 hr capsule, Take 1 capsule (240 mg total) by mouth daily., Disp: 90 capsule, Rfl: 1   donepezil (ARICEPT) 10 MG tablet, Take 5 mg by mouth at bedtime., Disp: , Rfl:    DULoxetine (CYMBALTA) 20 MG capsule, Take 1 capsule by mouth 2 (two) times daily., Disp: , Rfl:    Lactobacillus Rhamnosus, GG, (CULTURELLE PO), Take 1 capsule by mouth daily., Disp: , Rfl:    meclizine (ANTIVERT) 12.5 MG tablet, TAKE 1 TABLET BY MOUTH THREE TIMES DAILYAS NEEDED FOR DIZZINESS, Disp: 270 tablet, Rfl: 1   memantine (NAMENDA) 10 MG tablet, TAKE 1 TABLET BY MOUTH TWICE DAILY, Disp: 180 tablet, Rfl: 0  Multiple Vitamin (MULTIVITAMIN) tablet, Take 1 tablet by mouth daily., Disp: , Rfl:    Omega-3 Fatty Acids (FISH OIL) 1200 MG CAPS, Take 1 capsule by mouth daily. , Disp: , Rfl:    Polyethyl Glycol-Propyl Glycol 0.4-0.3 % SOLN, Apply 1 drop to eye daily as needed (dry eyes)., Disp: , Rfl:    Red Yeast Rice 600 MG CAPS, Take 2 capsules by mouth daily., Disp: , Rfl:   Social History   Tobacco Use  Smoking Status Never  Smokeless Tobacco Never    Allergies  Allergen Reactions   Angiotensin Receptor Blockers Anaphylaxis    Fatigue & Dizziness   Other Anaphylaxis    Other reaction(s): Other (See Comments) Dizziness, rash Fatigue & Dizziness   Hydralazine Other (See Comments)    Unknown   Latex     REACTION: Rash, blisters, breathing probs.   Losartan     Dizziness     Methotrexate Derivatives     Dizziness, rash    Spironolactone     Dizziness    Penicillins Hives and Rash   Objective:  There were no vitals filed for this visit. There is no height or weight on file to calculate BMI. Constitutional Well developed. Well nourished.  Vascular Dorsalis pedis pulses palpable bilaterally. Posterior tibial pulses palpable bilaterally. Capillary refill normal to all digits.  No cyanosis or clubbing noted. Pedal hair growth normal.  Neurologic Normal speech. Oriented to person, place, and time. Epicritic sensation to light touch grossly present bilaterally.  Dermatologic Nails well groomed and normal in appearance. No open wounds. No skin lesions.  Orthopedic: Pain on palpation to the left fourth metatarsal phalangeal joint.  Pain with range of motion of the first MTPJ joint.  Pain on the dorsal aspect of the foot.  Negative extensor or flexor tendinitis noted.  No pain at any of the other metatarsophalangeal joint noted   Radiographs: 3 views of skeletally mature adult left foot: No stress fracture or fractures noted.  No bony abnormalities identified.  Heel plantar and posterior heel spurring noted.  Elevatus of the first met noted.  Arthritic changes to the first metatarsophalangeal joint noted Assessment:   1. Foot injury, left, subsequent encounter   2. Capsulitis of metatarsophalangeal (MTP) joint of left foot    Plan:  Patient was evaluated and treated and all questions answered.  Left fourth MTP capsulitis with a history of foot injury -I explained the patient the etiology of capsulitis and various treatment options were discussed.  Given the amount of pain that she is having I believe she will benefit from steroid injection help decrease acute inflammatory component associate with pain.  Patient agrees with plan like to proceed with a steroid injection. -A steroid injection was performed at left fourth metatarsophalangeal joint using 1% plain  Lidocaine and 10 mg of Kenalog. This was well tolerated. -If there is no improvement we will discuss offloading pad versus MRI. -It is likely that patient's foot injury may have aggravated the joint leading to inflammation.   No follow-ups on file.

## 2021-03-21 IMAGING — MR MR LUMBAR SPINE W/O CM
5 series · 30 of 48 positions shown · non-contrast
Comparison: 10/11/2011 and prior.

CLINICAL DATA: Constant lower back pain, pain radiates into right
buttock.

EXAM:
MRI LUMBAR SPINE WITHOUT CONTRAST
TECHNIQUE: Multiplanar, multisequence MR imaging of the lumbar spine was
performed. No intravenous contrast was administered.

[Series 5: T2 · sagittal · 4.0mm · 0.81mm/px · 6 of 17 slices shown (1 of 2)]
[im 1/17]
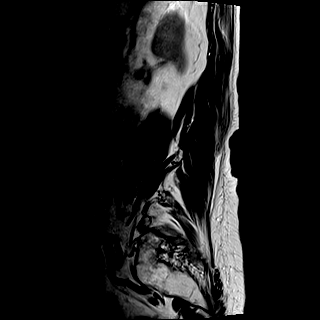
[im 4/17]
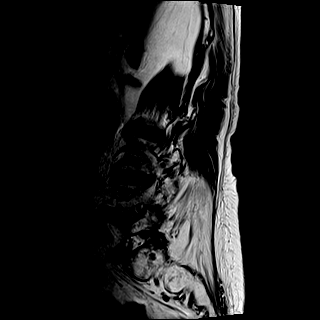
[im 7/17]
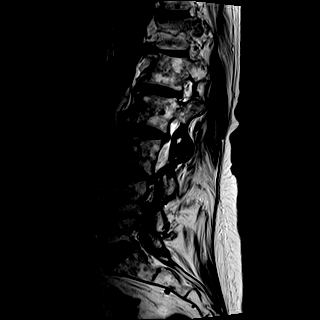
[im 10/17]
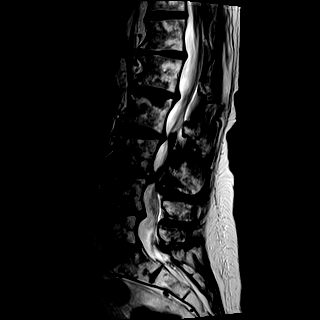
[im 13/17]
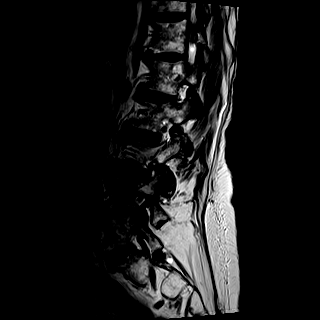
[im 17/17]
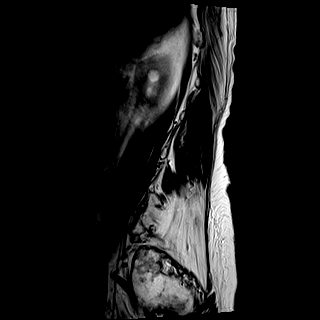

[Series 6: T1 · sagittal · 4.0mm · 0.81mm/px · 7 of 17 slices shown (1 of 2)]
[im 1/17]
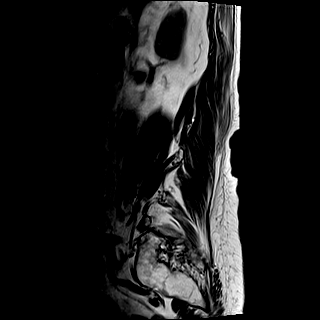
[im 3/17]
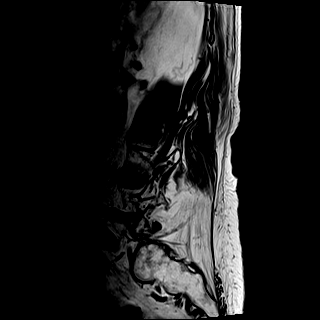
[im 6/17]
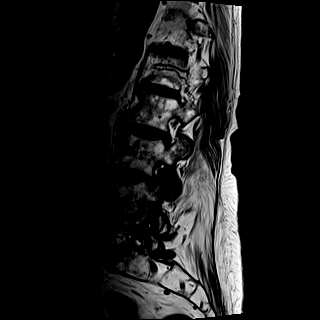
[im 9/17]
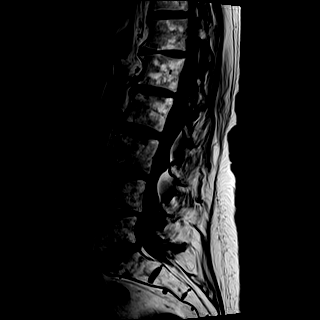
[im 11/17]
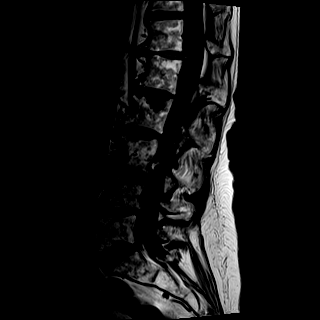
[im 14/17]
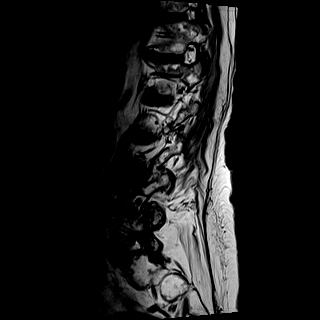
[im 17/17]
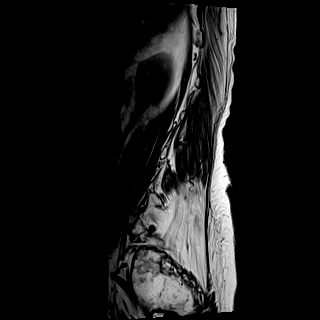

[Series 7: STIR · sagittal · 4.0mm · 0.41mm/px · 1 of 17 slices shown]
[im 1/17]
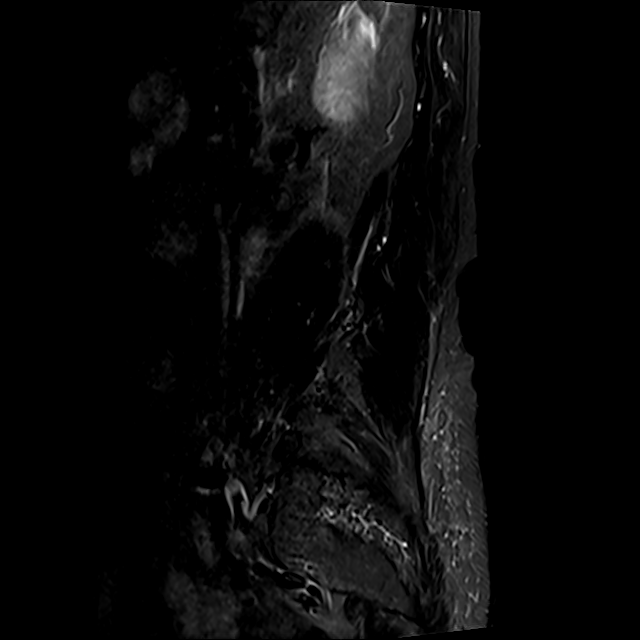

[Series 8: T2 · axial · 4.0mm · 0.78mm/px · z∈[-101,+94]mm · 8 of 36 slices shown (2 of 2)]
[im 1/36]
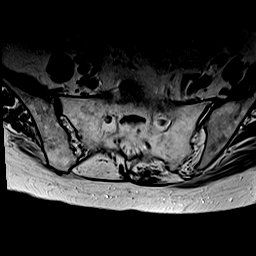
[im 6/36]
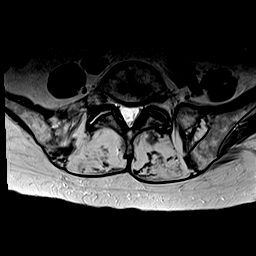
[im 11/36]
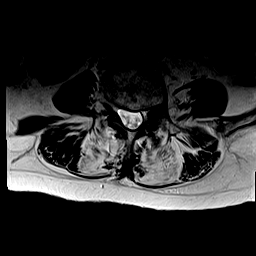
[im 17/36]
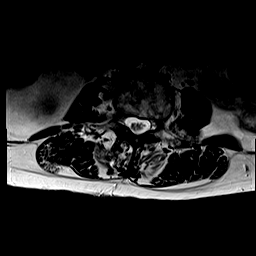
[im 19/36]
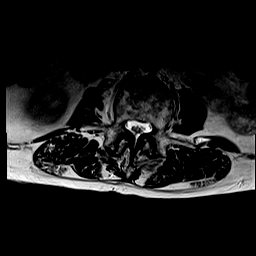
[im 25/36]
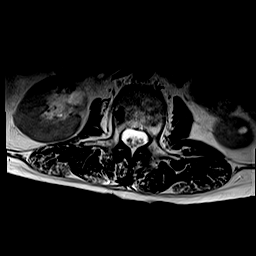
[im 30/36]
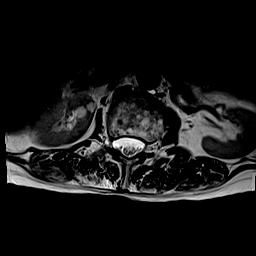
[im 36/36]
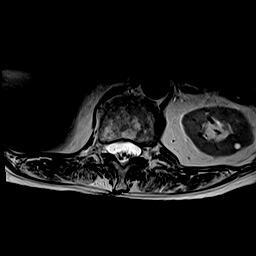

[Series 9: T1 · axial · 4.0mm · 0.39mm/px · z∈[-101,+94]mm · 8 of 36 slices shown (2 of 2)]
[im 1/36]
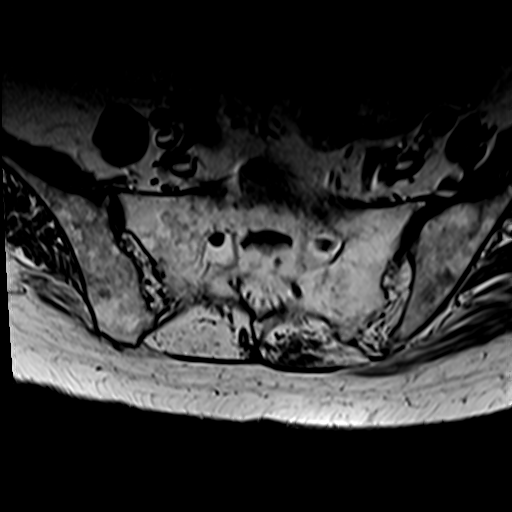
[im 6/36]
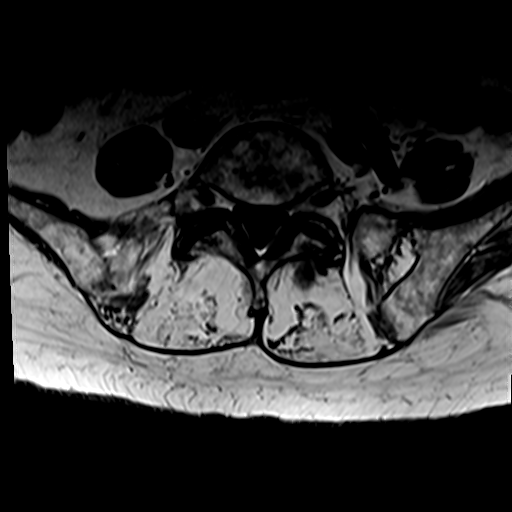
[im 11/36]
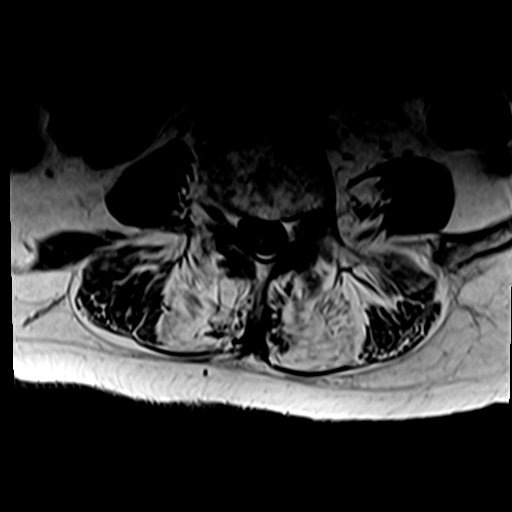
[im 17/36]
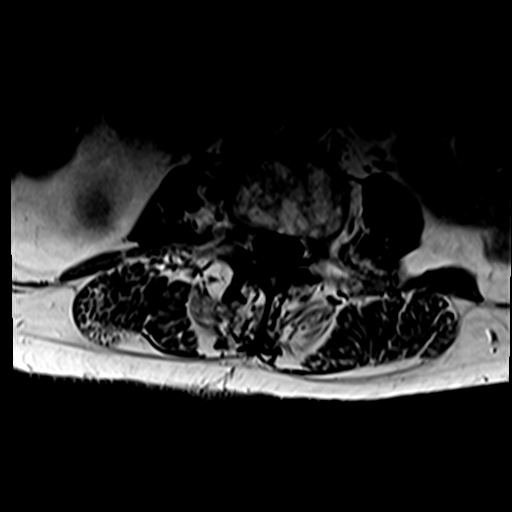
[im 19/36]
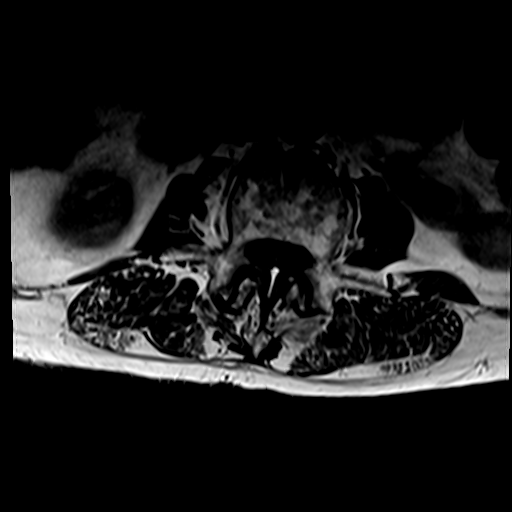
[im 25/36]
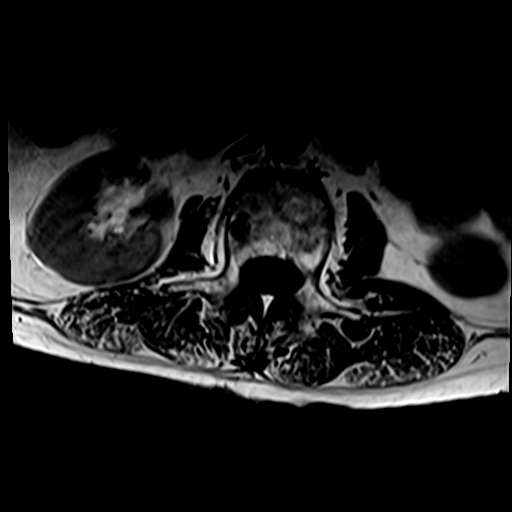
[im 30/36]
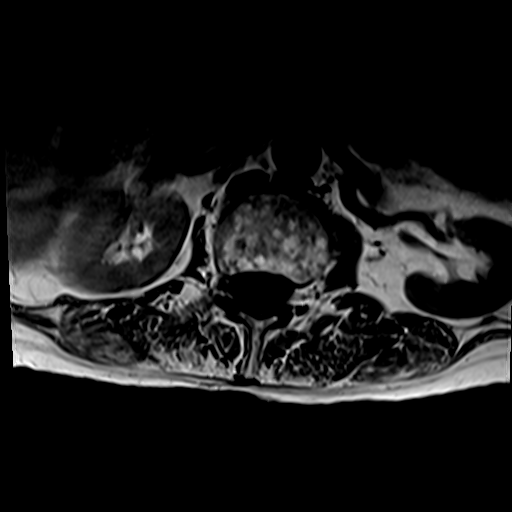
[im 36/36]
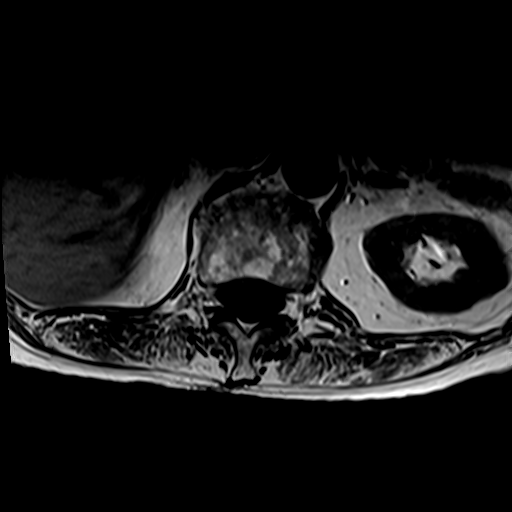

[30 of 48 positions shown; findings below may reference images not displayed]

FINDINGS: Segmentation: The lowest well-formed intervertebral disc space is
assumed to be the L5-S1 level.

Alignment: Straightening of lordosis. Trace stepwise grade 1
retrolisthesis at the L3-4, L4-5 levels.

Vertebrae: Diffuse bone marrow heterogeneity with Modic type 2
endplate degenerative changes and scattered hemangiomata versus
focal fat. No fracture or aggressive osseous lesion.

Conus medullaris and cauda equina: Conus extends to the T12 level.
Conus and cauda equina appear normal.

Disc levels: Multilevel desiccation, disc space loss. Prominent L2
Schmorl's node.

T12-L1: Minimal disc bulge and bilateral facet degenerative
spurring. Patent spinal canal and neural foramen.

L1-2: Disc bulge and bilateral facet hypertrophy. Prominent dorsal
epidural fat. Mild spinal canal narrowing. Patent neural foramen.

L2-3: Disc bulge with superimposed bilateral foraminal protrusions.
Bilateral facet hypertrophy. Prominent dorsal epidural fat. Moderate
spinal canal, moderate right and mild left neural foraminal
narrowing.

L3-4: Disc bulge with superimposed foraminal protrusions. Bilateral
facet hypertrophy. Prominent dorsal epidural fat. Severe spinal
canal, severe right and moderate left neural foraminal narrowing.

L4-5: Disc bulge with far left lateral protrusion. Bilateral facet
hypertrophy. Mild spinal canal, moderate right and severe left
neural foraminal narrowing.

L5-S1: Mild disc bulge, ligamentum flavum thickening and bilateral
facet hypertrophy. Patent spinal canal and right neural foramen.
Mild left neural foraminal narrowing.

Paraspinal and other soft tissues: Small bilateral renal cysts.
IMPRESSION: Severe spinal canal and moderate to severe neural foraminal
narrowing at the L3-4 level.

Moderate to severe right L2-3, bilateral L4-5 neural foraminal
narrowing. Moderate L2-3 spinal canal narrowing.

## 2021-03-28 ENCOUNTER — Other Ambulatory Visit: Payer: Self-pay | Admitting: Internal Medicine

## 2021-03-28 DIAGNOSIS — R413 Other amnesia: Secondary | ICD-10-CM

## 2021-04-11 ENCOUNTER — Ambulatory Visit (INDEPENDENT_AMBULATORY_CARE_PROVIDER_SITE_OTHER): Payer: PPO

## 2021-04-11 ENCOUNTER — Telehealth: Payer: PPO | Admitting: General Practice

## 2021-04-11 DIAGNOSIS — S99922D Unspecified injury of left foot, subsequent encounter: Secondary | ICD-10-CM

## 2021-04-11 DIAGNOSIS — I10 Essential (primary) hypertension: Secondary | ICD-10-CM | POA: Diagnosis not present

## 2021-04-11 DIAGNOSIS — F0150 Vascular dementia without behavioral disturbance: Secondary | ICD-10-CM

## 2021-04-11 NOTE — Chronic Care Management (AMB) (Signed)
Chronic Care Management   CCM RN Visit Note  04/11/2021 Name: Kristin [name] MRN: [id number] DOB: 1940/12/14  Subjective: Kristin [name] is a 80 y.o. year old female who is a primary care patient of [name], NP. The care management team was consulted for assistance with disease management and care coordination needs.    Engaged with patient by telephone for follow up visit in response to provider referral for case management and/or care coordination services.   Consent to Services:  The patient was given information about Chronic Care Management services, agreed to services, and gave verbal consent prior to initiation of services.  Please see initial visit note for detailed documentation.   Patient agreed to services and verbal consent obtained.   Assessment: Review of patient past medical history, allergies, medications, health status, including review of consultants reports, laboratory and other test data, was performed as part of comprehensive evaluation and provision of chronic care management services.   SDOH (Social Determinants of Health) assessments and interventions performed:    CCM Care Plan  Allergies  Allergen Reactions   Angiotensin Receptor Blockers Anaphylaxis    Fatigue & Dizziness   Other Anaphylaxis    Other reaction(s): Other (See Comments) Dizziness, rash Fatigue & Dizziness   Hydralazine Other (See Comments)    Unknown   Latex     REACTION: Rash, blisters, breathing probs.   Losartan     Dizziness    Methotrexate Derivatives     Dizziness, rash    Spironolactone     Dizziness    Penicillins Hives and Rash    Outpatient Encounter Medications as of 04/11/2021  Medication Sig   acetaminophen (TYLENOL) 325 MG tablet Take 650 mg by mouth every 8 (eight) hours as needed (pain).   ASPIRIN 81 PO Take 1 tablet by mouth daily.   betamethasone dipropionate (DIPROLENE) 0.05 % ointment APPLY TO AFFECTED AREAS TWICE DAILY AS NEEDED   Calcium  Carbonate-Vitamin D (CALTRATE 600+D PO) Take 1 tablet by mouth daily.    Cranberry 400 MG TABS Take 1 tablet by mouth daily.   diltiazem (CARDIZEM CD) 240 MG 24 hr capsule Take 1 capsule (240 mg total) by mouth daily.   donepezil (ARICEPT) 10 MG tablet Take 5 mg by mouth at bedtime.   DULoxetine (CYMBALTA) 20 MG capsule Take 1 capsule by mouth 2 (two) times daily.   Lactobacillus Rhamnosus, GG, (CULTURELLE PO) Take 1 capsule by mouth daily.   meclizine (ANTIVERT) 12.5 MG tablet TAKE 1 TABLET BY MOUTH THREE TIMES DAILYAS NEEDED FOR DIZZINESS   memantine (NAMENDA) 10 MG tablet TAKE 1 TABLET BY MOUTH TWICE DAILY   Multiple Vitamin (MULTIVITAMIN) tablet Take 1 tablet by mouth daily.   Omega-3 Fatty Acids (FISH OIL) 1200 MG CAPS Take 1 capsule by mouth daily.    Polyethyl Glycol-Propyl Glycol 0.4-0.3 % SOLN Apply 1 drop to eye daily as needed (dry eyes).   Red Yeast Rice 600 MG CAPS Take 2 capsules by mouth daily.   No facility-administered encounter medications on file as of 04/11/2021.    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 ([place]) 08/27/2015   Osteoarthritis 08/10/2011   MYOCARDIAL INFARCTION, HX OF 08/01/2010   Fibromyalgia 05/20/2008   Hyperlipidemia 05/29/2007   OBSTRUCTIVE  SLEEP APNEA 05/29/2007   Essential hypertension, benign 05/29/2007    Conditions to be addressed/monitored:HTN, Dementia, and foot fracture  Care Plan : RNCM Dementia (Adult)  Updates made by Vanita Ingles, RN since 04/11/2021 12:00 AM     Problem: [facility] Dementia - Cognitive Function   Priority: Medium     Long-Range Goal: RNCM - Dementia-Optimal Cognitive Function   Start Date: 01/13/2021  Expected End Date: 01/13/2022  This Visit's Progress: On track   Priority: Medium  Note:   Current Barriers:  Ineffective Self Health Maintenance  Unable to independently effectively manage dementia Unable to self administer medications as prescribed Does not adhere to provider recommendations related to taking Potassium as ordered Does not attend all scheduled provider appointments Does not adhere to prescribed medication regimen Lacks social connections Unable to perform IADLs independently Does not maintain contact with provider office Does not contact provider office for questions/concerns Clinical Goal(s):  Collaboration with [name], NP regarding development and update of comprehensive plan of care as evidenced by provider attestation and co-signature Inter-disciplinary care team collaboration (see longitudinal plan of care) patient will work with care management team to address care coordination and chronic disease management needs related to Disease Management Educational Needs Medication Management and Education Caregiver Stress support Dementia and Caregiver Support   Interventions:  Evaluation of current treatment plan related to Dementia, Limited social support, ADL IADL limitations, Limited education about dementia progression*, Cognitive Deficits, Memory Deficits, and Inability to perform IADL's independently self-management and patient's adherence to plan as established by provider. 04-11-2021: The patient states that she is forgetful but her husband is very supportive. The patient denies any new concerns and the husband states she is stable. Had a foot fracture recently but should finish up appointment with the provider this week.  Denies any new issues at this time. Will continue to monitor.  Collaboration with [name], NP regarding development and update of comprehensive plan of care as evidenced by provider attestation       and co-signature Inter-disciplinary care team collaboration (see longitudinal plan of  care) Discussed plans with patient for ongoing care management follow up and provided patient with direct contact information for care management team Self Care Activities:  Attends all scheduled provider appointments Calls pharmacy for medication refills Performs ADL's independently Calls provider office for new concerns or questions Patient Goals:- cognition assessed - cognitive-stimulating activities promoted - medication list reviewed - medication side effects managed - symptom review completed   Follow Up Plan: Telephone follow up appointment with care management team member scheduled for: 06/13/2021 at 1:45pm    Task: RNCM Facilitate Optimal Mental Processes Completed 04/11/2021  Outcome: Positive  Note:   Care Management Activities:    - cognition assessed - cognitive-stimulating activities promoted - medication list reviewed - medication side effects managed - symptom review completed    Notes:     Care Plan : RNCM Hypertension (Adult)  Updates made by Vanita Ingles, RN since 04/11/2021 12:00 AM     Problem: RNCM Hypertension (Hypertension)   Priority: Medium     Long-Range Goal: RNCM Hypertension Monitored   Start Date: 01/13/2021  Expected End Date: 01/13/2022  This Visit's Progress: On track  Priority: Medium  Note:   Objective:  Last practice recorded BP readings:  BP Readings from Last 3 Encounters:  01/03/21 (!) 135/52  11/29/20 140/64  07/12/20 (!) 123/45    Most recent eGFR/CrCl: No results found for: EGFR  No components found  for: CRCL Current Barriers:  Knowledge Deficits related to basic understanding of hypertension pathophysiology and self care management Knowledge Deficits related to understanding of medications prescribed for management of hypertension Non-adherence to prescribed medication regimen Non-adherence to scheduled provider appointments Cognitive Deficits Limited Social Support Unable to independently effectively manage  hypertension Unable to self administer medications as prescribed Does not attend all scheduled provider appointments Does not adhere to prescribed medication regimen Lacks social connections Unable to perform IADLs independently Does not maintain contact with provider office Does not contact provider office for questions/concerns Case Manager Clinical Goal(s):  patient will verbalize understanding of plan for hypertension management patient will attend all scheduled medical appointments: with PCP and cardiologist patient will demonstrate improved adherence to prescribed treatment plan for hypertension as evidenced by taking all medications as prescribed, monitoring and recording blood pressure as directed, adhering to low sodium/DASH diet patient will demonstrate improved health management independence as evidenced by checking blood pressure as directed and notifying PCP if SBP>150 or DBP > 90, taking all medications as prescribe, and adhering to a low sodium diet as discussed. Interventions:  Collaboration with [name], NP regarding development and update of comprehensive plan of care as evidenced by provider attestation and co-signature Inter-disciplinary care team collaboration (see longitudinal plan of care) UNABLE to independently:effetive manage hypertension Evaluation of current treatment plan related to hypertension self management and patient's adherence to plan as established by provider. 04-11-2021: The patient states she is doing well and has no new issues related to her HTN. Did not provide readings today. States she is compliant with her medications regimen and eating well.  Provided education to patient re: stroke prevention, s/s of heart attack and stroke, DASH diet, complications of uncontrolled blood pressure Reviewed medications with patient and discussed importance of compliance. 04-11-2021: The patient is compliant with medications. Her husband assist with managing her  care.  Discussed plans with patient for ongoing care management follow up and provided patient with direct contact information for care management team Advised patient, providing education and rationale, to monitor blood pressure daily and record, calling PCP for findings outside established parameters.  Reviewed scheduled/upcoming provider appointments including: does not have any scheduled appointment with PCP.  Will contact office for any further needs.  Offered her an appointment but patient declined. Self-Care Activities: - Attends all scheduled provider appointments Calls provider office for new concerns, questions, or BP outside discussed parameters Checks BP and records as discussed Follows a low sodium diet/DASH diet Patient Goals: - check blood pressure weekly - choose a place to take my blood pressure (home, clinic or office, retail store) - write blood pressure results in a log or diary - agree to work together to make changes - ask questions to understand - blood pressure trends reviewed - home or ambulatory blood pressure monitoring encouraged  Follow Up Plan: Telephone follow up appointment with care management team member scheduled for: 06-13-2021 at 1:45pm     Task: RNCM Identify and Monitor Blood Pressure Elevation Completed 04/11/2021  Outcome: Positive  Note:   Care Management Activities:    - blood pressure trends reviewed - home or ambulatory blood pressure monitoring encouraged    Notes:      Plan:Telephone follow up appointment with care management team member scheduled for:  06-13-2021 at 1:45 pm   [place], MSN, [place] Mobile: [phone]

## 2021-04-11 NOTE — Patient Instructions (Signed)
Visit Information  PATIENT GOALS:  Goals Addressed             This Visit's Progress    RNCM: Foot fracture and injury       04-11-2021: Evaluation of the patient foot injury.  The patient was outside and dropped a brick on  her foot and fractured her foot. She has been followed by the specialist and is doing well. She will have her last visit on 04-14-2021. Denies any new concerns. States she is not allowed to go outside and work with the bricks anymore.  Patient goals:  -remain safe in home environment, no new injuries -call the office for any new injuries or concerns -pace activities -keep MD appointments         Patient verbalizes understanding of instructions provided today and agrees to view in [place].   Telephone follow up appointment with care management team member scheduled for: 06-13-2021 at 145 pm   Noreene Larsson RN, MSN, [place] Mobile: [phone]

## 2021-04-14 ENCOUNTER — Other Ambulatory Visit: Payer: Self-pay

## 2021-04-14 ENCOUNTER — Ambulatory Visit: Payer: PPO | Admitting: Podiatry

## 2021-04-14 DIAGNOSIS — M7752 Other enthesopathy of left foot: Secondary | ICD-10-CM

## 2021-04-14 DIAGNOSIS — Q666 Other congenital valgus deformities of feet: Secondary | ICD-10-CM

## 2021-04-20 ENCOUNTER — Encounter: Payer: Self-pay | Admitting: Podiatry

## 2021-04-20 NOTE — Progress Notes (Signed)
Subjective:  Patient ID: Kristin [name], female    DOB: July 04, 1941,  MRN: [id number]  Chief Complaint  Patient presents with   Foot Pain    PT stated that she is doing well her foot is doing better she still has some pain here and there    80 y.o. female presents with the above complaint.  Patient presents with complaint of left fourth metatarsophalangeal joint capsulitis.  She states there is still residual pain associated with it.  She is about 70% better.  She would like to discuss further injection versus offloading.  She denies any other acute complaints   Review of Systems: Negative except as noted in the HPI. Denies N/V/F/Ch.  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 ([place]) 08/27/2015   Cough    no fever    Degeneration of lumbar or lumbosacral intervertebral disc    Dizziness    r/t meds   DVT (deep venous thrombosis) (HCC)    Fibromyalgia    History of shingles    [name] (irritable bowel syndrome)    takes OTC Hardin Negus colon health   Joint pain    Joint swelling    Myalgia and myositis, unspecified    Obstructive sleep apnea (adult) (pediatric)    Old myocardial infarction 1996   Osteoarthritis    Osteoarthrosis, unspecified whether generalized or localized, unspecified site    Psoriatic arthritis ([place])    Rheumatoid arthritis ([place])    Rosacea    Seasonal allergies    Stroke [facility])    ischaemic microvascular disease   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[place])    1991 , bleed and dizziness    Unspecified essential hypertension    takes Diltiazem,Metoprolol,and Losartan daily   UTI (lower urinary tract infection)    frequent=last one was winter of 2014    Current Outpatient  Medications:    acetaminophen (TYLENOL) 325 MG tablet, Take 650 mg by mouth every 8 (eight) hours as needed (pain)., Disp: , Rfl:    ASPIRIN 81 PO, Take 1 tablet by mouth daily., Disp: , Rfl:    betamethasone dipropionate (DIPROLENE) 0.05 % ointment, APPLY TO AFFECTED AREAS TWICE DAILY AS NEEDED, Disp: , Rfl:    Calcium Carbonate-Vitamin D (CALTRATE 600+D PO), Take 1 tablet by mouth daily. , Disp: , Rfl:    Cranberry 400 MG TABS, Take 1 tablet by mouth daily., Disp: , Rfl:    diltiazem (CARDIZEM CD) 240 MG 24 hr capsule, Take 1 capsule (240 mg total) by mouth daily., Disp: 90 capsule, Rfl: 1   donepezil (ARICEPT) 10 MG tablet, Take 5 mg by mouth at bedtime., Disp: , Rfl:    DULoxetine (CYMBALTA) 20 MG capsule, Take 1 capsule by mouth 2 (two) times daily., Disp: , Rfl:    Lactobacillus Rhamnosus, GG, (CULTURELLE PO), Take 1 capsule by mouth daily., Disp: , Rfl:    meclizine (ANTIVERT) 12.5 MG tablet, TAKE 1 TABLET BY MOUTH THREE TIMES DAILYAS NEEDED FOR DIZZINESS, Disp: 270 tablet, Rfl: 1   memantine (NAMENDA) 10 MG tablet, TAKE 1 TABLET BY MOUTH TWICE DAILY, Disp: 180 tablet, Rfl: 0   Multiple Vitamin (MULTIVITAMIN) tablet, Take 1 tablet  by mouth daily., Disp: , Rfl:    Omega-3 Fatty Acids (FISH OIL) 1200 MG CAPS, Take 1 capsule by mouth daily. , Disp: , Rfl:    Polyethyl Glycol-Propyl Glycol 0.4-0.3 % SOLN, Apply 1 drop to eye daily as needed (dry eyes)., Disp: , Rfl:    Red Yeast Rice 600 MG CAPS, Take 2 capsules by mouth daily., Disp: , Rfl:   Social History   Tobacco Use  Smoking Status Never  Smokeless Tobacco Never    Allergies  Allergen Reactions   Angiotensin Receptor Blockers Anaphylaxis    Fatigue & Dizziness   Other Anaphylaxis    Other reaction(s): Other (See Comments) Dizziness, rash Fatigue & Dizziness   Hydralazine Other (See Comments)    Unknown   Latex     REACTION: Rash, blisters, breathing probs.   Losartan     Dizziness    Methotrexate Derivatives      Dizziness, rash    Spironolactone     Dizziness    Penicillins Hives and Rash   Objective:  There were no vitals filed for this visit. There is no height or weight on file to calculate BMI. Constitutional Well developed. Well nourished.  Vascular Dorsalis pedis pulses palpable bilaterally. Posterior tibial pulses palpable bilaterally. Capillary refill normal to all digits.  No cyanosis or clubbing noted. Pedal hair growth normal.  Neurologic Normal speech. Oriented to person, place, and time. Epicritic sensation to light touch grossly present bilaterally.  Dermatologic Nails well groomed and normal in appearance. No open wounds. No skin lesions.  Orthopedic: Pain on palpation to the left fourth metatarsal phalangeal joint.  Pain with range of motion of the first MTPJ joint.  Pain on the dorsal aspect of the foot.  Negative extensor or flexor tendinitis noted.  No pain at any of the other metatarsophalangeal joint noted   Radiographs: 3 views of skeletally mature adult left foot: No stress fracture or fractures noted.  No bony abnormalities identified.  Heel plantar and posterior heel spurring noted.  Elevatus of the first met noted.  Arthritic changes to the first metatarsophalangeal joint noted Assessment:   1. Capsulitis of metatarsophalangeal (MTP) joint of left foot   2. Pes planovalgus     Plan:  Patient was evaluated and treated and all questions answered.  Left fourth MTP capsulitis with a history of foot injury -I explained the patient the etiology of capsulitis and various treatment options were discussed.  Given the amount of pain that she is having I believe she will benefit from steroid injection help decrease acute inflammatory component associate with pain.  Patient agrees with plan like to proceed with a steroid injection. -A second steroid injection was performed at left fourth metatarsophalangeal joint using 1% plain Lidocaine and 10 mg of Kenalog. This was well  tolerated. -Much improvement noted.  We will hold off on MRI for now.  Pes planovalgus -I explained to the patient the etiology of pes planovalgus and various treatment options were discussed.  Given the foot deformity with offloading the submetatarsal 4 I believe patient will benefit from custom-made orthotics help control the hindfoot motion support the arch of the foot take the stress away from submetatarsal 4 metatarsophalangeal joint.  Patient states understanding. -She was casted for orthotics   No follow-ups on file.

## 2021-04-25 DIAGNOSIS — R413 Other amnesia: Secondary | ICD-10-CM | POA: Diagnosis not present

## 2021-04-25 DIAGNOSIS — M159 Polyosteoarthritis, unspecified: Secondary | ICD-10-CM | POA: Diagnosis not present

## 2021-04-25 DIAGNOSIS — G8929 Other chronic pain: Secondary | ICD-10-CM | POA: Diagnosis not present

## 2021-04-25 DIAGNOSIS — Z872 Personal history of diseases of the skin and subcutaneous tissue: Secondary | ICD-10-CM | POA: Diagnosis not present

## 2021-04-25 DIAGNOSIS — R262 Difficulty in walking, not elsewhere classified: Secondary | ICD-10-CM | POA: Diagnosis not present

## 2021-04-25 DIAGNOSIS — R42 Dizziness and giddiness: Secondary | ICD-10-CM | POA: Diagnosis not present

## 2021-04-25 DIAGNOSIS — G479 Sleep disorder, unspecified: Secondary | ICD-10-CM | POA: Diagnosis not present

## 2021-04-25 DIAGNOSIS — M545 Low back pain, unspecified: Secondary | ICD-10-CM | POA: Diagnosis not present

## 2021-04-25 DIAGNOSIS — R2681 Unsteadiness on feet: Secondary | ICD-10-CM | POA: Diagnosis not present

## 2021-04-25 DIAGNOSIS — M539 Dorsopathy, unspecified: Secondary | ICD-10-CM | POA: Diagnosis not present

## 2021-04-27 ENCOUNTER — Ambulatory Visit (INDEPENDENT_AMBULATORY_CARE_PROVIDER_SITE_OTHER): Payer: PPO | Admitting: Pharmacist

## 2021-04-27 DIAGNOSIS — I10 Essential (primary) hypertension: Secondary | ICD-10-CM

## 2021-04-27 DIAGNOSIS — F0150 Vascular dementia without behavioral disturbance: Secondary | ICD-10-CM

## 2021-04-27 NOTE — Chronic Care Management (AMB) (Signed)
Chronic Care Management Pharmacy Note  04/27/2021 Name:  Kristin [name] MRN:  [id number] DOB:  1940/12/30   Subjective: Kristin [name] is an 80 y.o. year old female who is a primary patient of [name], NP.  The CCM team was consulted for assistance with disease management and care coordination needs.    Engaged with patient by telephone for follow up visit in response to provider referral for pharmacy case management and/or care coordination services.   Consent to Services:  The patient was given information about Chronic Care Management services, agreed to services, and gave verbal consent prior to initiation of services.  Please see initial visit note for detailed documentation.   Patient Care Team: [name], NP as PCP - General (Internal Medicine) Wellington Hampshire, MD as PCP - Cardiology (Cardiology) Wellington Hampshire, MD as Consulting Physician (Cardiology) Birder Robson, MD as Referring Physician (Ophthalmology) Oneta Rack, MD as Consulting Physician (Dermatology) Valinda Party, MD as Consulting Physician (Rheumatology) Vicenta Aly, [place] (Chiropractic Medicine) Curley Spice Virl Diamond, [place] as Pharmacist Vanita Ingles, RN as Case Manager (General Practice)   Recent consult visits: Office Visit with [facility] Neurology on 9/12 Office Visit with [facility] Rheumatology on 9/12 Office Visit with [facility] on [facility] visits: None in previous 6 months  Objective:  Lab Results  Component Value Date   CREATININE 1.20 (H) 01/14/2021   CREATININE 1.16 (H) 01/07/2021   CREATININE 1.16 (H) 12/24/2020    Social History   Tobacco Use  Smoking Status Never  Smokeless Tobacco Never   BP Readings from Last 3 Encounters:  01/03/21 (!) 135/52  11/29/20 140/64  07/12/20 (!) 123/45   Pulse Readings from Last 3 Encounters:  01/03/21 65  11/29/20 61  07/12/20 65   Wt Readings from Last 3  Encounters:  01/03/21 123 lb 6.4 oz (56 kg)  12/28/20 125 lb (56.7 kg)  11/29/20 128 lb (58.1 kg)    Assessment: Review of patient past medical history, allergies, medications, health status, including review of consultants reports, laboratory and other test data, was performed as part of comprehensive evaluation and provision of chronic care management services.   SDOH:  (Social Determinants of Health) assessments and interventions performed: none   CCM Care Plan  Allergies  Allergen Reactions   Angiotensin Receptor Blockers Anaphylaxis    Fatigue & Dizziness   Other Anaphylaxis    Other reaction(s): Other (See Comments) Dizziness, rash Fatigue & Dizziness   Hydralazine Other (See Comments)    Unknown   Latex     REACTION: Rash, blisters, breathing probs.   Losartan     Dizziness    Methotrexate Derivatives     Dizziness, rash    Spironolactone     Dizziness    Penicillins Hives and Rash    Medications Reviewed Today     Reviewed by Rennis Petty, RPH-CPP (Pharmacist) on 04/27/21 at 1446  Med List Status: <None>   Medication Order Taking? Sig Documenting Provider Last Dose Status Informant  acetaminophen (TYLENOL) 325 MG tablet [id number] Yes Take 650 mg by mouth every 8 (eight) hours as needed (pain). [provider] Taking Active Self  ASPIRIN 81 PO [id number] Yes Take 1 tablet by mouth daily. [provider] Taking Active Self  betamethasone dipropionate (DIPROLENE) 0.05 % ointment [id number]  APPLY TO AFFECTED AREAS TWICE DAILY AS NEEDED [provider]  Active   Calcium Carbonate-Vitamin  D (CALTRATE 600+D PO) [id number] Yes Take 1 tablet by mouth daily.  [provider] Taking Active Self  Cranberry 400 MG TABS [id number] Yes Take 1 tablet by mouth daily. [provider] Taking Active   diltiazem (CARDIZEM CD) 240 MG 24 hr capsule [id number] Yes Take 1 capsule (240 mg total) by mouth daily. Loel Dubonnet, NP Taking  Active   donepezil (ARICEPT) 10 MG tablet [id number] Yes Take 10 mg by mouth at bedtime. [provider] Taking Active   DULoxetine (CYMBALTA) 20 MG capsule [id number] Yes Take 1 capsule by mouth 2 (two) times daily. [provider] Taking Active   Lactobacillus Rhamnosus, GG, (CULTURELLE PO) [id number] Yes Take 1 capsule by mouth daily. [provider] Taking Active   meclizine (ANTIVERT) 12.5 MG tablet [id number] No TAKE 1 TABLET BY MOUTH THREE TIMES DAILYAS NEEDED FOR DIZZINESS  Patient not taking: Reported on 04/27/2021   Verl Bangs, FNP Not Taking Active   memantine (NAMENDA) 10 MG tablet [id number] Yes TAKE 1 TABLET BY MOUTH TWICE DAILY Baity, Coralie Keens, NP Taking Active   Multiple Vitamin (MULTIVITAMIN) tablet [id number] Yes Take 1 tablet by mouth daily. [provider] Taking Active Self  Omega-3 Fatty Acids (FISH OIL) 1200 MG CAPS [id number] Yes Take 1 capsule by mouth daily.  [provider] Taking Active Self  Polyethyl Glycol-Propyl Glycol 0.4-0.3 % SOLN [id number]  Apply 1 drop to eye daily as needed (dry eyes). [provider]  Active Self  Red Yeast Rice 600 MG CAPS [id number] Yes Take 2 capsules by mouth daily. 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 ([place]) 08/27/2015   Osteoarthritis 08/10/2011   MYOCARDIAL INFARCTION, HX OF 08/01/2010   Fibromyalgia 05/20/2008   Hyperlipidemia 05/29/2007   OBSTRUCTIVE SLEEP APNEA 05/29/2007   Essential hypertension, benign 05/29/2007    Immunization History  Administered Date(s) Administered   Fluad Quad(high Dose 65+) 06/03/2019, 06/01/2020   Influenza Split 05/16/2012   Influenza  Whole 05/15/2007, 05/20/2008, 05/14/2009, 05/14/2010   Influenza, High Dose Seasonal PF 05/11/2018   Influenza,inj,Quad PF,6+ Mos 05/13/2016, 06/12/2017   Influenza-Unspecified 05/14/2013, 06/06/2014, 06/14/2020   PFIZER(Purple Top)SARS-COV-2 Vaccination 09/10/2019, 10/01/2019, 05/17/2020   Pneumococcal Conjugate-13 10/17/2013   Pneumococcal Polysaccharide-23 08/14/2005, 02/19/2017   Td 08/14/2002   Zoster, Live 08/14/2005, 05/18/2008    Conditions to be addressed/monitored: HTN, HLD, dementia  Care Plan : PharmD - Medication Mgmt  Updates made by Rennis Petty, RPH-CPP since 04/27/2021 12:00 AM     Problem: Disease Progression      Long-Range Goal: Disease Progression Prevented or Minimized   Start Date: 09/29/2020  Expected End Date: 12/28/2020  This Visit's Progress: On track  Recent Progress: On track  Priority: High  Note:   Current Barriers:  Chronic Disease Management support, education, and care coordination needs related to HTN, [name], PVC, 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   Interventions: 1:1 collaboration with Webb Silversmith, NP regarding development and  update of comprehensive plan of care as evidenced by provider attestation and co-signature Inter-disciplinary care team collaboration (see longitudinal plan of care) Perform chart review Office Visit with [facility] Neurology on 9/12. Provider advised: Continue Aricept 10 mg nightly Continue Namenda 10 mg twice daily Walk 15-20 minutes daily with walker Continue Cymbalta 20 mg twice daily Start Gabapentin (Neurontin) 100 mg twice daily for one week, then increase to 200 mg twice daily Follow up with Dr. Melrose Nakayama in 2-3 months Office Visit with  [facility] Rheumatology on 9/12 Office Visit with [place] [facility] [place] at [facility] on 9/1 Today patient and husband report patient restarted taking Aricept 10 mg nightly.  Note Aricept dose previously decreased by Neurologist to determine if contributing to diarrhea. Patient and spouse deny patient having diarrhea since Aricept dose increased back Counsel patient/caregiver to follow up with PCP if diarrhea returns or other new/worsening symptoms Comprehensive medication review performed; medication list updated in electronic medical record Spouse reports new Rx for gabapentin not yet received by pharmacy.  Reports will follow up with [facility] Neurology today to request Rx be called in Reports discussed with provider - not planning to start for a couple of weeks, until after return from vacation  Caution patient/spouse to monitor for dizziness/sedation with start of gabapentin  Hypertension Current treatment:  Diltiazem CD 240 mg once daily Denies recently monitoring home BP as has misplaced monitor.  From review of chart note BP 135/66, HR 68 during Office Visit with Neurology on 9/12 Encourage patient/spouse to locate monitor or if needed replace upper arm BP monitor and to check home BP, keep log, contact Cardiology for readings outside established parameters and to bring to medical appointments  Patient Goals/Self-Care Activities Over the next 90 days, patient/spouse will:  - check blood pressure, document, and provide at future appointments - attend all scheduled provider appointments with help of spouse  Follow Up Plan: Telephone follow up appointment with care management team member scheduled for: 11/14 at 1:15 pm      Patient's preferred pharmacy is:  [place], [place] - [address] [place] [address] Phone: [phone] Fax: [phone]   Follow Up:  Patient agrees to Care Plan and Follow-up.  Wallace Cullens, PharmD, [place], CPP Clinical Pharmacist [facility] [phone]

## 2021-04-27 NOTE — Patient Instructions (Signed)
Visit Information  PATIENT GOALS:  Goals Addressed             This Visit's Progress    Pharmacy goals       Please check blood pressure at home, keep log, and bring to medical appointments  Feel free to call me with any questions or concerns. I look forward to our next call!   Wallace Cullens, PharmD, [place], CPP Clinical Pharmacist [facility] [phone]         The patient verbalized understanding of instructions, educational materials, and care plan provided today and declined offer to receive copy of patient instructions, educational materials, and care plan.   Telephone follow up appointment with care management team member scheduled for: 11/14 at 1:15 pm

## 2021-05-02 ENCOUNTER — Telehealth: Payer: PPO

## 2021-05-09 ENCOUNTER — Ambulatory Visit: Payer: PPO | Admitting: Podiatry

## 2021-05-11 ENCOUNTER — Telehealth: Payer: Self-pay | Admitting: Podiatry

## 2021-05-11 NOTE — Telephone Encounter (Signed)
Faxed prior auth to [employer] for orthotics.

## 2021-05-11 NOTE — Telephone Encounter (Signed)
Received [employer] auth # [id number] for orthotics (U9811 X 2) valid 10.1.2022 thru 12.30.22

## 2021-05-12 ENCOUNTER — Other Ambulatory Visit: Payer: Self-pay

## 2021-05-12 ENCOUNTER — Ambulatory Visit: Payer: PPO | Admitting: Podiatry

## 2021-05-12 ENCOUNTER — Encounter: Payer: Self-pay | Admitting: Podiatry

## 2021-05-12 DIAGNOSIS — M205X9 Other deformities of toe(s) (acquired), unspecified foot: Secondary | ICD-10-CM | POA: Diagnosis not present

## 2021-05-12 DIAGNOSIS — M2042 Other hammer toe(s) (acquired), left foot: Secondary | ICD-10-CM | POA: Diagnosis not present

## 2021-05-12 DIAGNOSIS — B351 Tinea unguium: Secondary | ICD-10-CM

## 2021-05-12 DIAGNOSIS — M2041 Other hammer toe(s) (acquired), right foot: Secondary | ICD-10-CM

## 2021-05-12 DIAGNOSIS — M79609 Pain in unspecified limb: Secondary | ICD-10-CM | POA: Diagnosis not present

## 2021-05-12 NOTE — Progress Notes (Signed)
This patient returns to my office for at risk foot care.  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  This patient is unable to cut nails herself since the patient cannot reach her nails.These nails are painful walking and wearing shoes. Patient also has significant discoloration at the base of her fourth toe left foot.  She says she is forgetful and does not remembetr injuring her left foot.This patient presents for at risk foot care today.  General Appearance  Alert, conversant and in no acute stress.  Vascular  Dorsalis pedis and posterior tibial  pulses are palpable  bilaterally.  Capillary return is within normal limits  bilaterally. Temperature is within normal limits  bilaterally.  Neurologic  Senn-Weinstein monofilament wire test within normal limits  bilaterally. Muscle power within normal limits bilaterally.  Nails Thick disfigured discolored nails with subungual debris  from hallux to fifth toes bilaterally. No evidence of bacterial infection or drainage bilaterally.  Orthopedic  No limitations of motion  feet .  No crepitus or effusions noted.  No bony pathology or digital deformities noted. 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  Hammer toes second  B/L.  Palpable pain over 2,3,4 MPJ left and 2,3,4 metatatsal shafts left foot.  Asymptomatic fourth toe which also is ecchymotic.  Skin  normotropic skin with no porokeratosis noted bilaterally.  No signs of infections or ulcers noted.   Ecchymosis at dorsal base digits 2-4 left foot.  Ecchymosis at bunion right foot.  Onychomycosis  Pain in right toes  Pain in left toes  Consent was obtained for treatment procedures.   Mechanical debridement of nails 1-5  bilaterally performed with a nail nipper.    Return office visit   3 months                   Told patient to return for periodic foot care and evaluation due to potential at risk complications.   Gardiner Barefoot DPM

## 2021-05-13 DIAGNOSIS — F0150 Vascular dementia without behavioral disturbance: Secondary | ICD-10-CM | POA: Diagnosis not present

## 2021-05-13 DIAGNOSIS — I10 Essential (primary) hypertension: Secondary | ICD-10-CM | POA: Diagnosis not present

## 2021-05-16 ENCOUNTER — Ambulatory Visit
Admission: RE | Admit: 2021-05-16 | Discharge: 2021-05-16 | Disposition: A | Discharge status: Home / Self Care | Payer: PPO | Source: Ambulatory Visit | Attending: Internal Medicine | Admitting: Internal Medicine

## 2021-05-16 ENCOUNTER — Other Ambulatory Visit: Payer: Self-pay

## 2021-05-16 DIAGNOSIS — Z1231 Encounter for screening mammogram for malignant neoplasm of breast: Secondary | ICD-10-CM | POA: Insufficient documentation

## 2021-05-18 ENCOUNTER — Ambulatory Visit
Admission: RE | Admit: 2021-05-18 | Discharge: 2021-05-18 | Disposition: A | Discharge status: Home / Self Care | Payer: PPO | Attending: Internal Medicine | Admitting: Internal Medicine

## 2021-05-18 ENCOUNTER — Ambulatory Visit
Admission: RE | Admit: 2021-05-18 | Discharge: 2021-05-18 | Disposition: A | Discharge status: Home / Self Care | Payer: PPO | Source: Ambulatory Visit | Attending: Internal Medicine | Admitting: Internal Medicine

## 2021-05-18 ENCOUNTER — Encounter: Payer: Self-pay | Admitting: Internal Medicine

## 2021-05-18 ENCOUNTER — Ambulatory Visit (INDEPENDENT_AMBULATORY_CARE_PROVIDER_SITE_OTHER): Payer: PPO | Admitting: Internal Medicine

## 2021-05-18 ENCOUNTER — Other Ambulatory Visit: Payer: Self-pay

## 2021-05-18 VITALS — BP 139/47 | HR 57 | Temp 97.1°F | Resp 18 | Ht 67.0 in | Wt 122.8 lb

## 2021-05-18 DIAGNOSIS — W19XXXA Unspecified fall, initial encounter: Secondary | ICD-10-CM

## 2021-05-18 DIAGNOSIS — M4856XA Collapsed vertebra, not elsewhere classified, lumbar region, initial encounter for fracture: Secondary | ICD-10-CM | POA: Insufficient documentation

## 2021-05-18 DIAGNOSIS — N183 Chronic kidney disease, stage 3 unspecified: Secondary | ICD-10-CM | POA: Insufficient documentation

## 2021-05-18 DIAGNOSIS — M533 Sacrococcygeal disorders, not elsewhere classified: Secondary | ICD-10-CM | POA: Insufficient documentation

## 2021-05-18 DIAGNOSIS — I700 Atherosclerosis of aorta: Secondary | ICD-10-CM | POA: Insufficient documentation

## 2021-05-18 DIAGNOSIS — Z23 Encounter for immunization: Secondary | ICD-10-CM | POA: Diagnosis not present

## 2021-05-18 NOTE — Progress Notes (Signed)
Subjective:    Patient ID: Kristin [name], female    DOB: 07/27/1941, 80 y.o.   MRN: [id number]  HPI  Pt presents to the clinic today with c/o tailbone pain. She reports this started yesterday after following backwards when trying to get something off a shelf.  She reports she landed directly on her buttocks.  She describes the pain as.  She reports a history of chronic back pain, but reports this is different.  She has taken.  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 ([place]) 08/27/2015   Cough    no fever    Degeneration of lumbar or lumbosacral intervertebral disc    Dizziness    r/t meds   DVT (deep venous thrombosis) (HCC)    Fibromyalgia    History of shingles    [name] (irritable bowel syndrome)    takes OTC [facility] Negus colon health   Joint pain    Joint swelling    Myalgia and myositis, unspecified    Obstructive sleep apnea (adult) (pediatric)    Old myocardial infarction 1996   Osteoarthritis    Osteoarthrosis, unspecified whether generalized or localized, unspecified site    Psoriatic arthritis ([place])    Rheumatoid arthritis ([place])    Rosacea    Seasonal allergies    Stroke [facility])    ischaemic microvascular disease   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[place])    1991 , bleed and dizziness    Unspecified essential hypertension    takes Diltiazem,Metoprolol,and Losartan daily   UTI (lower urinary tract infection)    frequent=last one was winter of 2014    Current Outpatient Medications  Medication Sig Dispense Refill   acetaminophen (TYLENOL) 325 MG tablet Take 650 mg by mouth every 8 (eight) hours as needed (pain).     ASPIRIN 81 PO Take 1 tablet by mouth daily.      betamethasone dipropionate (DIPROLENE) 0.05 % ointment APPLY TO AFFECTED AREAS TWICE DAILY AS NEEDED     Calcium Carbonate-Vitamin D (CALTRATE 600+D PO) Take 1 tablet by mouth daily.      Cranberry 400 MG TABS Take 1 tablet by mouth daily.     diltiazem (CARDIZEM CD) 240 MG 24 hr capsule Take 1 capsule (240 mg total) by mouth daily. 90 capsule 1   donepezil (ARICEPT) 10 MG tablet Take 10 mg by mouth at bedtime.     DULoxetine (CYMBALTA) 20 MG capsule Take 1 capsule by mouth 2 (two) times daily.     gabapentin (NEURONTIN) 100 MG capsule Take by mouth.     Lactobacillus Rhamnosus, GG, (CULTURELLE PO) Take 1 capsule by mouth daily.     meclizine (ANTIVERT) 12.5 MG tablet TAKE 1 TABLET BY MOUTH THREE TIMES DAILYAS NEEDED FOR DIZZINESS 270 tablet 1   memantine (NAMENDA) 10 MG tablet TAKE 1 TABLET BY MOUTH TWICE DAILY 180 tablet 0   memantine (NAMENDA) 10 MG tablet Take 1 tablet by mouth 2 (two) times daily.     Multiple Vitamin (MULTIVITAMIN) tablet Take 1 tablet by mouth daily.     Omega-3 Fatty Acids (FISH OIL) 1200 MG CAPS Take 1  capsule by mouth daily.      Polyethyl Glycol-Propyl Glycol 0.4-0.3 % SOLN Apply 1 drop to eye daily as needed (dry eyes).     Red Yeast Rice 600 MG CAPS Take 2 capsules by mouth daily.     No current facility-administered medications for this visit.    Allergies  Allergen Reactions   Angiotensin Receptor Blockers Anaphylaxis    Fatigue & Dizziness   Other Anaphylaxis    Other reaction(s): Other (See Comments) Dizziness, rash Fatigue & Dizziness   Hydralazine Other (See Comments)    Unknown   Latex     REACTION: Rash, blisters, breathing probs.   Losartan     Dizziness    Methotrexate Derivatives     Dizziness, rash    Spironolactone     Dizziness    Penicillins Hives and Rash    Family History  Problem Relation Age of Onset   Colon cancer Father    Heart attack Mother    Coronary artery disease Mother    Osteoporosis Mother    Heart  disease Mother    Heart failure Mother    GER disease Sister    Skin cancer Sister    Hypertension Sister    Osteopenia Sister    GER disease Sister    Breast cancer Paternal Aunt        age 7   Heart failure Maternal Grandmother    Heart failure Maternal Grandfather     Social History   Socioeconomic History   Marital status: Married    Spouse name: Agricultural consultant   Number of children: 2   Years of education: BSN   Highest education level: Not on file  Occupational History   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 She is DNR, [name] is husband [name] , then son [name]       10/22/12-cardiac work up found the reason for her dizziness,  she needs no neurological follow up for that.  her enthusiasm for CPAP remains limited and is explained by her resiudal high AHI after she finally reached compliance ,[place] 13 points.     95% pressure is 11.7 cm , AHI  20 .7- she does not benefit from CPAP . She is allowed to d/c the machine and we need to concerntrate on her dizziness and facial right droop. ordered MRI brain repeat.    Eye exam next week. PT at [facility]. MRI brain in April- per patients request order next month.          Social Determinants of Health   Financial Resource Strain: Low Risk    Difficulty of Paying Living Expenses: Not hard at all  Food Insecurity: No Food Insecurity   Worried About Paediatric nurse in the Last  Year: Never true   [place] in the Last Year: Never true  Transportation Needs: No Transportation Needs   Lack of Transportation (Medical): No   Lack of Transportation (Non-Medical): No  Physical Activity: Insufficiently Active   Days of Exercise per Week: 3 days   Minutes of Exercise per Session: 20 min  Stress: No Stress Concern Present   Feeling of Stress : Not at all  Social Connections: Not on file  Intimate Partner Violence: Not on file     Constitutional: Denies fever, malaise, fatigue, headache or abrupt weight changes.  Respiratory: Denies difficulty breathing, shortness of breath, cough or sputum production.   Cardiovascular: Denies chest pain, chest tightness, palpitations or swelling in the hands or feet.  Gastrointestinal: Denies abdominal pain, bloating, constipation, diarrhea or blood in the stool.  GU: Denies urgency, frequency, pain with urination, burning sensation, blood in urine, odor or discharge. Musculoskeletal: Patient reports chronic low back pain, acute tailbone pain.  Denies decrease in range of motion, difficulty with gait, muscle pain or joint swelling.  Skin: Denies bruising or abrasion.  Neurological: Denies numbness, tingling, weakness or problems with balance and coordination.   No other specific complaints in a complete review of systems (except as listed in HPI above).  Objective:   Physical Exam   BP (!) 139/47 (BP Location: Right Arm, Patient Position: Sitting, Cuff Size: Normal)   Pulse (!) 57   Temp (!) 97.1 F (36.2 C) (Temporal)   Resp 18   Ht 5\' 7"  (1.702 m)   Wt 122 lb 12.8 oz (55.7 kg)   LMP 08/14/1989   SpO2 100%   BMI 19.23 kg/m  Wt Readings from Last 3 Encounters:  05/18/21 122 lb 12.8 oz (55.7 kg)  01/03/21 123 lb 6.4 oz (56 kg)  12/28/20 125 lb (56.7 kg)    General: Appears her stated age, chronically ill-appearing, in NAD. Skin: Warm, dry and intact. No bruising or abrasions noted. HEENT:  Head: normal shape and size; Eyes: EOMs intact;  Cardiovascular: Normal rate and rhythm. S1,S2 noted.  No murmur, rubs or gallops noted.  Pulmonary/Chest: Normal effort and positive vesicular breath sounds. No respiratory distress. No wheezes, rales or ronchi noted.  Musculoskeletal: Decreased flexion, extension and rotation of the spine due to kyphosis and pain.  Pain with palpation over the midline sacrum.  Gait slow and steady without device. Neurological: Alert.   BMET    Component Value Date/Time   NA 139 01/14/2021 0829   NA 136 12/29/2016 1036   K 3.9 01/14/2021 0829   CL 102 01/14/2021 0829   CO2 27 01/14/2021 0829   GLUCOSE 112 (H) 01/14/2021 0829   BUN 18 01/14/2021 0829   BUN 58 (H) 12/29/2016 1036   CREATININE 1.20 (H) 01/14/2021 0829   CREATININE 1.21 (H) 11/03/2019 0941   CALCIUM 9.3 01/14/2021 0829   GFRNONAA 46 (L) 01/14/2021 0829   GFRNONAA 43 (L) 11/03/2019 0941   GFRAA 49 (L) 11/03/2019 0941    Lipid Panel     Component Value Date/Time   CHOL 227 (H) 11/03/2019 0941   TRIG 134 11/03/2019 0941   HDL 65 11/03/2019 0941   CHOLHDL 3.5 11/03/2019 0941   VLDL 21.2 02/21/2018 1035   LDLCALC 136 (H) 11/03/2019 0941    CBC    Component Value Date/Time   WBC 7.4 11/03/2019 0941   RBC 3.83 11/03/2019 0941   HGB 12.1 11/03/2019 0941   HGB 12.1 03/22/2009 1331   HCT 36.8  11/03/2019 0941   HCT 36.1 03/22/2009 1331   PLT 258 11/03/2019 0941   PLT 257 03/22/2009 1331   MCV 96.1 11/03/2019 0941   MCV 97.5 03/22/2009 1331   MCH 31.6 11/03/2019 0941   MCHC 32.9 11/03/2019 0941   RDW 13.7 11/03/2019 0941   RDW 15.9 (H) 03/22/2009 1331   LYMPHSABS 1,177 11/03/2019 0941   LYMPHSABS 1.6 03/22/2009 1331   MONOABS 0.7 06/14/2018 1320   MONOABS 0.6 03/22/2009 1331   EOSABS 370 11/03/2019 0941   EOSABS 0.1 03/22/2009 1331   BASOSABS 81 11/03/2019 0941   BASOSABS 0.1 03/22/2009 1331    Hgb A1C No results found for: HGBA1C         Assessment & Plan:    Sacral Pain status post Fall:  X-ray sacrum/coccyx to rule out fracture Encouraged Tylenol or ibuprofen OTC as needed for pain Can apply ice for 10 minutes twice daily to help with pain Discussed safety measures to avoid further falls at home  We will follow-up after imaging with further recommendations and treatment plan Webb Silversmith, NP This visit occurred during the SARS-CoV-2 public health emergency.  Safety protocols were in place, including screening questions prior to the visit, additional usage of staff PPE, and extensive cleaning of exam room while observing appropriate contact time as indicated for disinfecting solutions.

## 2021-05-18 NOTE — Patient Instructions (Signed)
Tailbone Injury The tailbone is the small bone at the lower end of the backbone (spine). The tailbone can become injured from: A fall. Sitting to row or bike for a long time. Having a baby. This type of injury can be painful. Most tailbone injuries get better on their own in 4-6 weeks. Follow these instructions at home: Activity Avoid sitting in one place for a long time. Wear proper pads and gear when riding a bike or rowing. Increase your activity as the pain allows. Do exercises as told by your doctor or physical therapist. Managing pain, stiffness, and swelling To lessen pain: Sit on a large, rubber or inflated ring or cushion. Lean forward when you sit. If told, apply ice to the injured area. Put ice in a plastic bag. Place a towel between your skin and the bag. Leave the ice on for 20 minutes, 2-3 times per day. Do this for the first 1-2 days. If told, put heat on the injured area. Do this as often as told by your doctor. Use the heat source that your doctor recommends, such as a moist heat pack or a heating pad. Place a towel between your skin and the heat source. Leave the heat on for 20-30 minutes. Remove the heat if your skin turns bright red. This is very important if you are unable to feel pain, heat, or cold. You may have a greater risk of getting burned. General instructions Take over-the-counter and prescription medicines only as told by your doctor. To prevent or treat trouble pooping (constipation) or pain when pooping, your doctor may suggest that you: Drink enough fluid to keep your pee (urine) pale yellow. Eat foods that are high in fiber. These include fresh fruits and vegetables, whole grains, and beans. Limit foods that are high in fat and sugar. These include fried and sweet foods. Take an over-the-counter or prescription medicine to treat trouble pooping. Keep all follow-up visits as told by your doctor. This is important. Contact a doctor if: Your pain gets  worse. Pooping causes you pain. You cannot poop after 4 days. You have pain during sex. Summary A tailbone injury can happen from a fall, from sitting for a long time to row or bike, or after having a baby. These injuries can be painful. Most tailbone injuries get better on their own in 4-6 weeks. Sit on a large, rubber or inflated ring or cushion to lessen pain. Avoid sitting in one place for a long time. Follow your doctor's suggestions to prevent or treat trouble pooping. This information is not intended to replace advice given to you by your health care provider. Make sure you discuss any questions you have with your health care provider. Document Revised: 08/28/2017 Document Reviewed: 08/28/2017 Elsevier Patient Education  2022 [employer].

## 2021-05-19 ENCOUNTER — Ambulatory Visit: Payer: PPO | Admitting: Podiatry

## 2021-06-06 ENCOUNTER — Other Ambulatory Visit: Payer: Self-pay | Admitting: Family

## 2021-06-06 DIAGNOSIS — I493 Ventricular premature depolarization: Secondary | ICD-10-CM

## 2021-06-13 ENCOUNTER — Ambulatory Visit (INDEPENDENT_AMBULATORY_CARE_PROVIDER_SITE_OTHER): Payer: PPO

## 2021-06-13 ENCOUNTER — Telehealth: Payer: PPO | Admitting: General Practice

## 2021-06-13 DIAGNOSIS — I10 Essential (primary) hypertension: Secondary | ICD-10-CM

## 2021-06-13 DIAGNOSIS — R413 Other amnesia: Secondary | ICD-10-CM

## 2021-06-13 DIAGNOSIS — E782 Mixed hyperlipidemia: Secondary | ICD-10-CM | POA: Diagnosis not present

## 2021-06-13 DIAGNOSIS — Z9181 History of falling: Secondary | ICD-10-CM

## 2021-06-13 NOTE — Patient Instructions (Signed)
Visit Information  PATIENT GOALS:  Goals Addressed             This Visit's Progress    RNCM: Foot fracture and injury       04-11-2021: Evaluation of the patient foot injury.  The patient was outside and dropped a brick on  her foot and fractured her foot. She has been followed by the specialist and is doing well. She will have her last visit on 04-14-2021. Denies any new concerns. States she is not allowed to go outside and work with the bricks anymore. 06-13-2021: The patient has not had any other injuries to foot. Was seen by the pcp on 05-18-2021 for a fall and possible fracture of coccyx. The patient and spouse deny any new falls or any new injuries. Will continue to monitor.    Patient goals:  -remain safe in home environment, no new injuries -call the office for any new injuries or concerns -pace activities -keep MD appointments         Patient verbalizes understanding of instructions provided today and agrees to view in [place].   Telephone follow up appointment with care management team member scheduled for: 08-22-2021 at 1 pm  Noreene Larsson RN, MSN, [place] Mobile: [phone]

## 2021-06-13 NOTE — Chronic Care Management (AMB) (Signed)
Chronic Care Management   CCM RN Visit Note  06/13/2021 Name: GIULIANNA ROCHA MRN: [id number] DOB: 1940-09-10  Subjective: Kristin [name] is a 80 y.o. year old female who is a primary care patient of [name], NP. The care management team was consulted for assistance with disease management and care coordination needs.    Engaged with patient by telephone for follow up visit in response to provider referral for case management and/or care coordination services.   Consent to Services:  The patient was given information about Chronic Care Management services, agreed to services, and gave verbal consent prior to initiation of services.  Please see initial visit note for detailed documentation.   Patient agreed to services and verbal consent obtained.   Assessment: Review of patient past medical history, allergies, medications, health status, including review of consultants reports, laboratory and other test data, was performed as part of comprehensive evaluation and provision of chronic care management services.   SDOH (Social Determinants of Health) assessments and interventions performed:  SDOH Interventions    Flowsheet Row Most Recent Value  SDOH Interventions   Food Insecurity Interventions Intervention Not Indicated  Financial Strain Interventions Intervention Not Indicated  Housing Interventions Intervention Not Indicated  Intimate Partner Violence Interventions Intervention Not Indicated  Physical Activity Interventions Other (Comments)  [does her crafts, no structured activity, likes going outside when she can]  Stress Interventions Intervention Not Indicated  Social Connections Interventions Other (Comment)  [good support system]  Transportation Interventions Intervention Not Indicated        CCM Care Plan  Allergies  Allergen Reactions   Angiotensin Receptor Blockers Anaphylaxis    Fatigue & Dizziness   Other Anaphylaxis    Other reaction(s): Other (See  Comments) Dizziness, rash Fatigue & Dizziness   Hydralazine Other (See Comments)    Unknown   Latex     REACTION: Rash, blisters, breathing probs.   Losartan     Dizziness    Methotrexate Derivatives     Dizziness, rash    Spironolactone     Dizziness    Penicillins Hives and Rash    Outpatient Encounter Medications as of 06/13/2021  Medication Sig   acetaminophen (TYLENOL) 325 MG tablet Take 650 mg by mouth every 8 (eight) hours as needed (pain).   ASPIRIN 81 PO Take 1 tablet by mouth daily.   betamethasone dipropionate (DIPROLENE) 0.05 % ointment APPLY TO AFFECTED AREAS TWICE DAILY AS NEEDED   Calcium Carbonate-Vitamin D (CALTRATE 600+D PO) Take 1 tablet by mouth daily.    Cranberry 400 MG TABS Take 1 tablet by mouth daily.   diltiazem (CARDIZEM CD) 240 MG 24 hr capsule TAKE 1 CAPSULE BY MOUTH ONCE DAILY   donepezil (ARICEPT) 10 MG tablet Take 10 mg by mouth at bedtime.   DULoxetine (CYMBALTA) 20 MG capsule Take 1 capsule by mouth 2 (two) times daily.   gabapentin (NEURONTIN) 100 MG capsule Take by mouth.   Lactobacillus Rhamnosus, GG, (CULTURELLE PO) Take 1 capsule by mouth daily.   meclizine (ANTIVERT) 12.5 MG tablet TAKE 1 TABLET BY MOUTH THREE TIMES DAILYAS NEEDED FOR DIZZINESS   memantine (NAMENDA) 10 MG tablet TAKE 1 TABLET BY MOUTH TWICE DAILY   memantine (NAMENDA) 10 MG tablet Take 1 tablet by mouth 2 (two) times daily.   Multiple Vitamin (MULTIVITAMIN) tablet Take 1 tablet by mouth daily.   Omega-3 Fatty Acids (FISH OIL) 1200 MG CAPS Take 1 capsule by mouth daily.    Polyethyl Glycol-Propyl Glycol 0.4-0.3 %  SOLN Apply 1 drop to eye daily as needed (dry eyes).   Red Yeast Rice 600 MG CAPS Take 2 capsules by mouth daily.   No facility-administered encounter medications on file as of 06/13/2021.    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 ([place]) 08/27/2015   Osteoarthritis 08/10/2011   MYOCARDIAL INFARCTION, HX OF 08/01/2010   Fibromyalgia 05/20/2008   Hyperlipidemia 05/29/2007   OBSTRUCTIVE SLEEP APNEA 05/29/2007   Essential hypertension, benign 05/29/2007    Conditions to be addressed/monitored:HTN, HLD, Dementia, and Falls prevention and safety   Care Plan : RNCM Dementia (Adult)  Updates made by Vanita Ingles, RN since 06/13/2021 12:00 AM  Completed 06/13/2021   Problem: [facility] Dementia - Cognitive Function Resolved 06/13/2021  Priority: Medium     Long-Range Goal: RNCM - Dementia-Optimal Cognitive Function Completed 06/13/2021  Start Date: 01/13/2021  Expected End Date: 01/13/2022  Recent Progress: On track  Priority: Medium  Note:   Current Barriers: Resolving, duplicate goal  Ineffective Self Health Maintenance  Unable to independently effectively manage dementia Unable to self administer medications as prescribed Does not adhere to provider recommendations related to taking Potassium as ordered Does not attend all scheduled provider appointments Does not adhere to prescribed medication regimen Lacks social connections Unable to perform IADLs independently Does not maintain contact with provider office Does not contact provider office for questions/concerns Clinical Goal(s):  Collaboration with [name], NP regarding development and update of comprehensive plan of care as evidenced by provider attestation and co-signature Inter-disciplinary care team collaboration (see longitudinal plan of care) patient will work with care management team to address care coordination and chronic disease management needs related to Disease Management Educational Needs Medication Management and Education Caregiver Stress support Dementia and  Caregiver Support   Interventions:  Evaluation of current treatment plan related to Dementia, Limited social support, ADL IADL limitations, Limited education about dementia progression*, Cognitive Deficits, Memory Deficits, and Inability to perform IADL's independently self-management and patient's adherence to plan as established by provider. 04-11-2021: The patient states that she is forgetful but her husband is very supportive. The patient denies any new concerns and the husband states she is stable. Had a foot fracture recently but should finish up appointment with the provider this week.  Denies any new issues at this time. Will continue to monitor.  Collaboration with [name], NP regarding development and update of comprehensive plan of care as evidenced by provider attestation       and co-signature Inter-disciplinary care team collaboration (see longitudinal plan of care) Discussed plans with patient for ongoing care management follow up and provided patient with direct contact information for care management team Self Care Activities:  Attends all scheduled provider appointments Calls pharmacy for medication refills Performs ADL's independently Calls provider office for new concerns or questions Patient Goals:- cognition assessed - cognitive-stimulating activities promoted - medication list reviewed - medication side effects managed - symptom review completed   Follow Up Plan: Telephone follow up appointment with care management team member scheduled for: 06/13/2021 at 1:45pm    Care Plan : RNCM Hypertension (Adult)  Updates made by Vanita Ingles, RN since 06/13/2021 12:00 AM  Completed 06/13/2021   Problem:  RNCM Hypertension (Hypertension) Resolved 06/13/2021  Priority: Medium     Long-Range Goal: RNCM Hypertension Monitored Completed 06/13/2021  Start Date: 01/13/2021  Expected End Date: 01/13/2022  Recent Progress: On track  Priority: Medium  Note:   Objective:  Resolving, duplicate goal  Last practice recorded BP readings:  BP Readings from Last 3 Encounters:  01/03/21 (!) 135/52  11/29/20 140/64  07/12/20 (!) 123/45    Most recent eGFR/CrCl: No results found for: EGFR  No components found for: CRCL Current Barriers:  Knowledge Deficits related to basic understanding of hypertension pathophysiology and self care management Knowledge Deficits related to understanding of medications prescribed for management of hypertension Non-adherence to prescribed medication regimen Non-adherence to scheduled provider appointments Cognitive Deficits Limited Social Support Unable to independently effectively manage hypertension Unable to self administer medications as prescribed Does not attend all scheduled provider appointments Does not adhere to prescribed medication regimen Lacks social connections Unable to perform IADLs independently Does not maintain contact with provider office Does not contact provider office for questions/concerns Case Manager Clinical Goal(s):  patient will verbalize understanding of plan for hypertension management patient will attend all scheduled medical appointments: with PCP and cardiologist patient will demonstrate improved adherence to prescribed treatment plan for hypertension as evidenced by taking all medications as prescribed, monitoring and recording blood pressure as directed, adhering to low sodium/DASH diet patient will demonstrate improved health management independence as evidenced by checking blood pressure as directed and notifying PCP if SBP>150 or DBP > 90, taking all medications as prescribe, and adhering to a low sodium diet as discussed. Interventions:  Collaboration with [name], NP regarding development and update of comprehensive plan of care as evidenced by provider attestation and co-signature Inter-disciplinary care team collaboration (see longitudinal plan of care) UNABLE to  independently:effetive manage hypertension Evaluation of current treatment plan related to hypertension self management and patient's adherence to plan as established by provider. 04-11-2021: The patient states she is doing well and has no new issues related to her HTN. Did not provide readings today. States she is compliant with her medications regimen and eating well.  Provided education to patient re: stroke prevention, s/s of heart attack and stroke, DASH diet, complications of uncontrolled blood pressure Reviewed medications with patient and discussed importance of compliance. 04-11-2021: The patient is compliant with medications. Her husband assist with managing her care.  Discussed plans with patient for ongoing care management follow up and provided patient with direct contact information for care management team Advised patient, providing education and rationale, to monitor blood pressure daily and record, calling PCP for findings outside established parameters.  Reviewed scheduled/upcoming provider appointments including: does not have any scheduled appointment with PCP.  Will contact office for any further needs.  Offered her an appointment but patient declined. Self-Care Activities: - Attends all scheduled provider appointments Calls provider office for new concerns, questions, or BP outside discussed parameters Checks BP and records as discussed Follows a low sodium diet/DASH diet Patient Goals: - check blood pressure weekly - choose a place to take my blood pressure (home, clinic or office, retail store) - write blood pressure results in a log or diary - agree to work together to make changes - ask questions to understand - blood pressure trends reviewed - home or ambulatory blood pressure monitoring encouraged  Follow Up Plan: Telephone follow up appointment with care management team member scheduled for: 06-13-2021 at 1:45pm     Care Plan : RNCM: General Plan of Care (Adult)  for  Chronic Disease Management and Care Coordination Needs  Updates made by Vanita Ingles, RN since 06/13/2021 12:00 AM     Problem: RNCM: Development of Plan of Care for Chronic Disease Management( HTN, HLD, Dementia, Falls)   Priority: High     Long-Range Goal: RNCM: Effective Management of Plan of Care for Chronic Disease Management( HTN, HLD, Dementia, Falls)   Start Date: 06/13/2021  Expected End Date: 06/13/2022  Priority: High  Note:   Current Barriers:  Knowledge Deficits related to plan of care for management of HTN, HLD, Dementia  and Falls Prevention and Safety  Chronic Disease Management support and education needs related to HTN, HLD, and Dementia and falls prevention   RNCM Clinical Goal(s):  Patient will verbalize understanding of plan for management of HTN, HLD, Dementia, and Falls prevention and safety as evidenced by regular follow up with pcp, working with the CCM team on regular outreaches, calling the office for changes in conditions or questions, stabilized conditions take all medications exactly as prescribed and will call provider for medication related questions as evidenced by compliance with medications, calling for refills before running out of medications, calling pharm D for questions or concerns    demonstrate a decrease in falls and injury  exacerbations  as evidenced by no new fall or injuries related to falls and safety hazards in the patients surroundings demonstrate ongoing self health care management ability for effective management of chronic conditions as evidenced by  working with the CCM team   through collaboration with Consulting civil engineer, provider, and care team.   Interventions: 1:1 collaboration with primary care provider regarding development and update of comprehensive plan of care as evidenced by provider attestation and co-signature Inter-disciplinary care team collaboration (see longitudinal plan of care) Evaluation of current treatment plan  related to  self management and patient's adherence to plan as established by provider   SDOH Barriers (Status: Goal on Track (progressing): YES.) Long Term Goal  Patient interviewed and SDOH assessment performed        SDOH Interventions    Flowsheet Row Most Recent Value  SDOH Interventions   Food Insecurity Interventions Intervention Not Indicated  Financial Strain Interventions Intervention Not Indicated  Housing Interventions Intervention Not Indicated  Intimate Partner Violence Interventions Intervention Not Indicated  Physical Activity Interventions Other (Comments)  [does her crafts, no structured activity, likes going outside when she can]  Stress Interventions Intervention Not Indicated  Social Connections Interventions Other (Comment)  [good support system]  Transportation Interventions Intervention Not Indicated     Patient interviewed and appropriate assessments performed Provided patient with information about resources available for changes in SDOH and care guides to help with local resources in [facility]  Discussed plans with patient for ongoing care management follow up and provided patient with direct contact information for care management team Advised patient to call the office for changes in SDOH, questions or concerns    Falls:  (Status: Goal on Track (progressing): YES.) Long Term Goal  Provided written and verbal education re: potential causes of falls and Fall prevention strategies Reviewed medications and discussed potential side effects of medications such as dizziness and frequent urination Advised patient of importance of notifying provider of falls Assessed for signs and symptoms of orthostatic hypotension Assessed for falls since last encounter Assessed patients knowledge of fall risk prevention secondary to previously provided education Screening for signs and symptoms of depression related to chronic disease state Assessed social determinant  of health barriers  Hyperlipidemia:  (Status: Goal on Track (progressing): YES.) Lab Results  Component Value Date   CHOL 227 (H) 11/03/2019   HDL 65 11/03/2019   LDLCALC 136 (H) 11/03/2019   LDLDIRECT 158.1 10/02/2013   TRIG 134 11/03/2019   CHOLHDL 3.5 11/03/2019     Medication review performed; medication list updated in electronic medical record.  Provider established cholesterol goals reviewed; Counseled on importance of regular laboratory monitoring as prescribed; Provided HLD educational materials; Reviewed role and benefits of statin for ASCVD risk reduction; Discussed strategies to manage statin-induced myalgias; Reviewed importance of limiting foods high in cholesterol;  Hypertension: (Status: Goal on Track (progressing): YES.) Last practice recorded BP readings:  BP Readings from Last 3 Encounters:  05/18/21 (!) 139/47  01/03/21 (!) 135/52  11/29/20 140/64  Most recent eGFR/CrCl: No results found for: EGFR  No components found for: CRCL  Evaluation of current treatment plan related to hypertension self management and patient's adherence to plan as established by provider;   Provided education to patient re: stroke prevention, s/s of heart attack and stroke; Reviewed prescribed diet heart healthy Reviewed medications with patient and discussed importance of compliance;  Discussed plans with patient for ongoing care management follow up and provided patient with direct contact information for care management team; Advised patient, providing education and rationale, to monitor blood pressure daily and record, calling PCP for findings outside established parameters;  Provided education on prescribed diet heart healthy;  Discussed complications of poorly controlled blood pressure such as heart disease, stroke, circulatory complications, vision complications, kidney impairment, sexual dysfunction;    Dementia   (Status: Goal on Track (progressing): YES.) Long Term Goal   Evaluation of current treatment plan related to Dementia, Mental Health Concerns  self-management and patient's adherence to plan as established by provider. Discussed plans with patient for ongoing care management follow up and provided patient with direct contact information for care management team Advised patient to call the office for changes in conditions, changes in mood, questions or concerns; Reviewed medications with patient and discussed compliance. The patient is taking Aricept 10 mg at bedtime and has seen an improvement in her sleep habits ; Discussed plans with patient for ongoing care management follow up and provided patient with direct contact information for care management team; Advised patient to discuss changes in dementia and new concerns  with provider; Screening for signs and symptoms of depression related to chronic disease state;  Assessed social determinant of health barriers;    Patient Goals/Self-Care Activities: Patient will self administer medications as prescribed as evidenced by self report/primary caregiver report  Patient will attend all scheduled provider appointments as evidenced by clinician review of documented attendance to scheduled appointments and patient/caregiver report Patient will call pharmacy for medication refills as evidenced by patient report and review of pharmacy fill history as appropriate Patient will attend church or other social activities as evidenced by patient report Patient will continue to perform ADL's independently as evidenced by patient/caregiver report Patient will continue to perform IADL's independently as evidenced by patient/caregiver report Patient will call provider office for new concerns or questions as evidenced by review of documented incoming telephone call notes and patient report Patient will work with BSW to address care coordination needs and will continue to work with the clinical team to address health care and  disease management related needs as evidenced by documented adherence to scheduled care management/care coordination appointments - check blood pressure 3 times per week - choose a place to take  my blood pressure (home, clinic or office, retail store) - write blood pressure results in a log or diary - learn about high blood pressure - keep a blood pressure log - take blood pressure log to all doctor appointments - call doctor for signs and symptoms of high blood pressure - develop an action plan for high blood pressure - keep all doctor appointments - take medications for blood pressure exactly as prescribed - report new symptoms to your doctor - eat more whole grains, fruits and vegetables, lean meats and healthy fats - call for medicine refill 2 or 3 days before it runs out - take all medications exactly as prescribed - call doctor with any symptoms you believe are related to your medicine - call doctor when you experience any new symptoms - go to all doctor appointments as scheduled - adhere to prescribed diet: heart healthy       Plan:Telephone follow up appointment with care management team member scheduled for:  08-22-2021 at 1 pm  [place], MSN, [place] Mobile: [phone]

## 2021-06-27 ENCOUNTER — Ambulatory Visit (INDEPENDENT_AMBULATORY_CARE_PROVIDER_SITE_OTHER): Payer: PPO | Admitting: Pharmacist

## 2021-06-27 DIAGNOSIS — I10 Essential (primary) hypertension: Secondary | ICD-10-CM

## 2021-06-27 NOTE — Patient Instructions (Signed)
Visit Information  Please check blood pressure at home, keep log, and bring to medical appointments  Feel free to call me with any questions or concerns. I look forward to our next call!   Wallace Cullens, PharmD, [place], CPP Clinical Pharmacist [facility] [phone]  The patient verbalized understanding of instructions, educational materials, and care plan provided today and declined offer to receive copy of patient instructions, educational materials, and care plan.   Telephone follow up appointment with care management team member scheduled for: 2/13 at 2:30 pm

## 2021-06-27 NOTE — Chronic Care Management (AMB) (Signed)
Chronic Care Management Pharmacy Note  06/27/2021 Name:  Kristin [name] MRN:  [id number] DOB:  [birth year]   Subjective: Kristin [name] is an 80 y.o. year old female who is a primary patient of [name], NP.  The CCM team was consulted for assistance with disease management and care coordination needs.    Engaged with patient and spouse by telephone for follow up visit in response to provider referral for pharmacy case management and/or care coordination services.   Consent to Services:  The patient was given information about Chronic Care Management services, agreed to services, and gave verbal consent prior to initiation of services.  Please see initial visit note for detailed documentation.   Patient Care Team: [name], NP as PCP - General (Internal Medicine) Wellington Hampshire, MD as PCP - Cardiology (Cardiology) Wellington Hampshire, MD as Consulting Physician (Cardiology) Birder Robson, MD as Referring Physician (Ophthalmology) Oneta Rack, MD as Consulting Physician (Dermatology) Valinda Party, MD as Consulting Physician (Rheumatology) Vicenta Aly, [place] (Chiropractic Medicine) Curley Spice Virl Diamond, RPH-CPP as Pharmacist Vanita Ingles, RN as Case Manager (General Practice)  Recent office visits: Office Visit with PCP on 10/5 for c/o tailbone pain   Objective:  Lab Results  Component Value Date   CREATININE 1.20 (H) 01/14/2021   CREATININE 1.16 (H) 01/07/2021   CREATININE 1.16 (H) 12/24/2020       Component Value Date/Time   CHOL 227 (H) 11/03/2019 0941   TRIG 134 11/03/2019 0941   HDL 65 11/03/2019 0941   CHOLHDL 3.5 11/03/2019 0941   VLDL 21.2 02/21/2018 1035   LDLCALC 136 (H) 11/03/2019 0941   LDLDIRECT 158.1 10/02/2013 0914    Hepatic Function Latest Ref Rng & Units 11/03/2019 02/21/2018 02/19/2017  Total Protein 6.1 - 8.1 g/dL 6.8 7.3 7.3  Albumin 3.5 - 5.2 g/dL - 3.9 4.0  AST 10 - 35 U/L 21 19 16   ALT 6 - 29 U/L 14 15 11   Alk  Phosphatase 39 - 117 U/L - 67 59  Total Bilirubin 0.2 - 1.2 mg/dL 0.4 0.4 0.4  Bilirubin, Direct 0.0 - 0.3 mg/dL - - -    Social History   Tobacco Use  Smoking Status Never  Smokeless Tobacco Never   BP Readings from Last 3 Encounters:  05/18/21 (!) 139/47  01/03/21 (!) 135/52  11/29/20 140/64   Pulse Readings from Last 3 Encounters:  05/18/21 (!) 57  01/03/21 65  11/29/20 61   Wt Readings from Last 3 Encounters:  05/18/21 122 lb 12.8 oz (55.7 kg)  01/03/21 123 lb 6.4 oz (56 kg)  12/28/20 125 lb (56.7 kg)    Assessment: Review of patient past medical history, allergies, medications, health status, including review of consultants reports, laboratory and other test data, was performed as part of comprehensive evaluation and provision of chronic care management services.   SDOH:  (Social Determinants of Health) assessments and interventions performed:    CCM Care Plan  Allergies  Allergen Reactions   Angiotensin Receptor Blockers Anaphylaxis    Fatigue & Dizziness   Other Anaphylaxis    Other reaction(s): Other (See Comments) Dizziness, rash Fatigue & Dizziness   Hydralazine Other (See Comments)    Unknown   Latex     REACTION: Rash, blisters, breathing probs.   Losartan     Dizziness    Methotrexate Derivatives     Dizziness, rash    Spironolactone     Dizziness  Penicillins Hives and Rash    Medications Reviewed Today     Reviewed by Rennis Petty, RPH-CPP (Pharmacist) on 06/27/21 at 29  Med List Status: <None>   Medication Order Taking? Sig Documenting Provider Last Dose Status Informant  acetaminophen (TYLENOL) 325 MG tablet [id number] Yes Take 650 mg by mouth every 8 (eight) hours as needed (pain). [provider] Taking Active Self  ASPIRIN 81 PO [id number] Yes Take 1 tablet by mouth daily. [provider] Taking Active Self  betamethasone dipropionate (DIPROLENE) 0.05 % ointment [id number]  APPLY TO AFFECTED AREAS TWICE  DAILY AS NEEDED [provider]  Active   Calcium Carbonate-Vitamin D (CALTRATE 600+D PO) [id number] Yes Take 1 tablet by mouth 2 (two) times daily. [provider] Taking Active Self  Cranberry 400 MG TABS [id number] Yes Take 1 tablet by mouth daily. [provider] Taking Active   diltiazem (CARDIZEM CD) 240 MG 24 hr capsule [id number] Yes TAKE 1 CAPSULE BY MOUTH ONCE DAILY Loel Dubonnet, NP Taking Active   donepezil (ARICEPT) 10 MG tablet [id number] Yes Take 10 mg by mouth at bedtime. [provider] Taking Active   DULoxetine (CYMBALTA) 20 MG capsule [id number] Yes Take 1 capsule by mouth 2 (two) times daily. [provider] Taking Active   gabapentin (NEURONTIN) 100 MG capsule [id number] Yes Take 200 mg by mouth 2 (two) times daily. [provider] Taking Active   Lactobacillus Rhamnosus, GG, (CULTURELLE PO) [id number] Yes Take 1 capsule by mouth daily. [provider] Taking Active   meclizine (ANTIVERT) 12.5 MG tablet [id number] Yes TAKE 1 TABLET BY MOUTH THREE TIMES DAILYAS NEEDED FOR DIZZINESS Verl Bangs, FNP Taking Active   memantine (NAMENDA) 10 MG tablet [id number] Yes TAKE 1 TABLET BY MOUTH TWICE DAILY Baity, Coralie Keens, NP Taking Active   Multiple Vitamin (MULTIVITAMIN) tablet [id number] Yes Take 1 tablet by mouth daily. [provider] Taking Active Self  Omega-3 Fatty Acids (FISH OIL) 1200 MG CAPS [id number] Yes Take 1 capsule by mouth daily.  [provider] Taking Active Self  Polyethyl Glycol-Propyl Glycol 0.4-0.3 % SOLN [id number]  Apply 1 drop to eye daily as needed (dry eyes). [provider]  Active Self  Red Yeast Rice 600 MG CAPS [id number] Yes Take 2 capsules by mouth daily. 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 ([place]) 08/27/2015   Osteoarthritis 08/10/2011   MYOCARDIAL INFARCTION, HX OF 08/01/2010   Fibromyalgia 05/20/2008   Hyperlipidemia 05/29/2007   OBSTRUCTIVE SLEEP APNEA 05/29/2007   Essential hypertension, benign 05/29/2007    Immunization History  Administered Date(s) Administered   Fluad Quad(high Dose 65+) 06/03/2019, 06/01/2020, 05/18/2021   Influenza Split 05/16/2012   Influenza Whole 05/15/2007, 05/20/2008, 05/14/2009, 05/14/2010   Influenza, High Dose Seasonal PF 05/11/2018   Influenza,inj,Quad PF,6+ Mos 05/13/2016, 06/12/2017   Influenza-Unspecified 05/14/2013, 06/06/2014, 06/14/2020   PFIZER(Purple Top)SARS-COV-2 Vaccination 09/10/2019, 10/01/2019, 05/17/2020   Pneumococcal Conjugate-13 10/17/2013   Pneumococcal Polysaccharide-23 08/14/2005, 02/19/2017   Td 08/14/2002   Zoster, Live 08/14/2005, 05/18/2008    Conditions to be addressed/monitored: HTN and memory loss  Care Plan : PharmD -  Medication Mgmt  Updates made by Rennis Petty, RPH-CPP since 06/27/2021 12:00 AM     Problem: Disease Progression      Long-Range Goal: Disease Progression Prevented or Minimized   Start Date: 09/29/2020  Expected End Date: 12/28/2020  This Visit's Progress: On track  Recent Progress: On track  Priority: High  Note:   Current Barriers:  Chronic Disease Management support, education, and care coordination needs related to HTN, [name], PVC, 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   Interventions: 1:1 collaboration with Webb Silversmith, NP regarding  development and update of comprehensive plan of care as evidenced by provider attestation and co-signature Inter-disciplinary care team collaboration (see longitudinal plan of care) Perform chart review Patient seen for Office Visit with PCP on 10/5 for c/o tailbone pain Comprehensive medication review performed; medication list updated in electronic medical record Encourage patient to schedule follow up appointment with PCP if tailbone pain not improving Encourage husband to continue to use weekly pillbox to organize patient's medications  Hypertension Current treatment:  Diltiazem CD 240 mg once daily Reports recent blood pressure readings 11/10: 140/70, HR 70 11/11: 142/70, HR 68 Encourage patient/spouse to contact Cardiology to schedule next follow up appointment  Encourage patient/spouse to check home BP, keep log, contact Cardiology for readings outside established parameters and to bring to medical appointments  Patient Goals/Self-Care Activities Over the next 90 days, patient/spouse will:  - check blood pressure, document, and provide at future appointments - attend all scheduled provider appointments with help of spouse  Follow Up Plan: Telephone follow up appointment with care management team member scheduled for: 2/13 at 2:30 pm      Patient's preferred pharmacy is:  [facility] [place] [facility] [address] [place] [address] Phone: [phone] Fax: [phone]   Follow Up:  Patient agrees to Care Plan and Follow-up.  Wallace Cullens, PharmD, Para [place], CPP Clinical Pharmacist [facility] [phone]

## 2021-07-04 ENCOUNTER — Telehealth: Payer: Self-pay | Admitting: Cardiovascular Disease

## 2021-07-04 DIAGNOSIS — I493 Ventricular premature depolarization: Secondary | ICD-10-CM

## 2021-07-04 MED ORDER — DILTIAZEM HCL ER COATED BEADS 240 MG PO CP24: 240.0000 mg | ORAL_CAPSULE | Freq: Every day | ORAL | 0 refills | Status: DC

## 2021-07-04 NOTE — Telephone Encounter (Signed)
*  STAT* If patient is at the pharmacy, call can be transferred to refill team.   1. Which medications need to be refilled? (please list name of each medication and dose if known) Cardizem 240 mg 1 capsule daily  2. Which pharmacy/location (including street and city if local pharmacy) is medication to be sent to? [facility]  3. Do they need a 30 day or 90 day supply? 90 day

## 2021-07-04 NOTE — Telephone Encounter (Signed)
Requested Prescriptions   Signed Prescriptions Disp Refills   diltiazem (CARDIZEM CD) 240 MG 24 hr capsule 90 capsule 0    Sig: Take 1 capsule (240 mg total) by mouth daily.    Authorizing Provider: Kathlyn [place] A    Ordering User: Raelene Bott, Catherine Cubero L

## 2021-07-13 DIAGNOSIS — I10 Essential (primary) hypertension: Secondary | ICD-10-CM

## 2021-08-11 ENCOUNTER — Encounter: Payer: Self-pay | Admitting: Podiatry

## 2021-08-11 ENCOUNTER — Other Ambulatory Visit: Payer: Self-pay

## 2021-08-11 ENCOUNTER — Ambulatory Visit: Payer: PPO | Admitting: Podiatry

## 2021-08-11 DIAGNOSIS — B351 Tinea unguium: Secondary | ICD-10-CM | POA: Diagnosis not present

## 2021-08-11 DIAGNOSIS — M2041 Other hammer toe(s) (acquired), right foot: Secondary | ICD-10-CM

## 2021-08-11 DIAGNOSIS — M79676 Pain in unspecified toe(s): Secondary | ICD-10-CM

## 2021-08-11 DIAGNOSIS — M2042 Other hammer toe(s) (acquired), left foot: Secondary | ICD-10-CM

## 2021-08-11 DIAGNOSIS — M205X9 Other deformities of toe(s) (acquired), unspecified foot: Secondary | ICD-10-CM

## 2021-08-11 NOTE — Progress Notes (Signed)
This patient returns to my office for at risk foot care.  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  This patient is unable to cut nails herself since the patient cannot reach her nails.These nails are painful walking and wearing shoes. This patient presents for at risk foot care today.  General Appearance  Alert, conversant and in no acute stress.  Vascular  Dorsalis pedis and posterior tibial  pulses are palpable  bilaterally.  Capillary return is within normal limits  bilaterally. Temperature is within normal limits  bilaterally.  Neurologic  Senn-Weinstein monofilament wire test within normal limits  bilaterally. Muscle power within normal limits bilaterally.  Nails Thick disfigured discolored nails with subungual debris  from hallux to fifth toes bilaterally. No evidence of bacterial infection or drainage bilaterally.  Orthopedic  No limitations of motion  feet .  No crepitus or effusions noted.  No bony pathology or digital deformities noted. 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  Hammer toes second  B/L.  Palpable pain over 2,3,4 MPJ left and 2,3,4 metatatsal shafts left foot.  Asymptomatic fourth toe which also is ecchymotic.  Skin  normotropic skin with no porokeratosis noted bilaterally.  No signs of infections or ulcers noted.   Ecchymosis at dorsal base digits 2-4 left foot.  Ecchymosis at bunion right foot.  Onychomycosis  Pain in right toes  Pain in left toes  Consent was obtained for treatment procedures.   Mechanical debridement of nails 1-5  bilaterally performed with a nail nipper.    Return office visit   3 months                   Told patient to return for periodic foot care and evaluation due to potential at risk complications.   Gardiner Barefoot DPM

## 2021-08-22 ENCOUNTER — Ambulatory Visit (INDEPENDENT_AMBULATORY_CARE_PROVIDER_SITE_OTHER): Payer: PPO

## 2021-08-22 ENCOUNTER — Telehealth: Payer: PPO

## 2021-08-22 DIAGNOSIS — R413 Other amnesia: Secondary | ICD-10-CM

## 2021-08-22 DIAGNOSIS — I10 Essential (primary) hypertension: Secondary | ICD-10-CM

## 2021-08-22 DIAGNOSIS — E782 Mixed hyperlipidemia: Secondary | ICD-10-CM

## 2021-08-22 DIAGNOSIS — Z9181 History of falling: Secondary | ICD-10-CM

## 2021-08-22 DIAGNOSIS — F0150 Vascular dementia without behavioral disturbance: Secondary | ICD-10-CM

## 2021-08-22 DIAGNOSIS — M159 Polyosteoarthritis, unspecified: Secondary | ICD-10-CM

## 2021-08-22 NOTE — Chronic Care Management (AMB) (Signed)
Chronic Care Management   CCM RN Visit Note  08/22/2021 Name: Kristin [name] MRN: [id number] DOB: Apr 08, 1941  Subjective: Kristin [name] is a 81 y.o. year old female who is a primary care patient of [name], NP. The care management team was consulted for assistance with disease management and care coordination needs.    Engaged with patient by telephone for follow up visit in response to provider referral for case management and/or care coordination services.   Consent to Services:  The patient was given information about Chronic Care Management services, agreed to services, and gave verbal consent prior to initiation of services.  Please see initial visit note for detailed documentation.   Patient agreed to services and verbal consent obtained.   Assessment: Review of patient past medical history, allergies, medications, health status, including review of consultants reports, laboratory and other test data, was performed as part of comprehensive evaluation and provision of chronic care management services.   SDOH (Social Determinants of Health) assessments and interventions performed:    CCM Care Plan  Allergies  Allergen Reactions   Angiotensin Receptor Blockers Anaphylaxis    Fatigue & Dizziness   Other Anaphylaxis    Other reaction(s): Other (See Comments) Dizziness, rash Fatigue & Dizziness   Hydralazine Other (See Comments)    Unknown   Latex     REACTION: Rash, blisters, breathing probs.   Losartan     Dizziness    Methotrexate Derivatives     Dizziness, rash    Spironolactone     Dizziness    Penicillins Hives and Rash    Outpatient Encounter Medications as of 08/22/2021  Medication Sig   acetaminophen (TYLENOL) 325 MG tablet Take 650 mg by mouth every 8 (eight) hours as needed (pain).   ASPIRIN 81 PO Take 1 tablet by mouth daily.   betamethasone dipropionate (DIPROLENE) 0.05 % ointment APPLY TO AFFECTED AREAS TWICE DAILY AS NEEDED   Calcium  Carbonate-Vitamin D (CALTRATE 600+D PO) Take 1 tablet by mouth 2 (two) times daily.   Cranberry 400 MG TABS Take 1 tablet by mouth daily.   diltiazem (CARDIZEM CD) 240 MG 24 hr capsule Take 1 capsule (240 mg total) by mouth daily.   donepezil (ARICEPT) 10 MG tablet Take 10 mg by mouth at bedtime.   DULoxetine (CYMBALTA) 20 MG capsule Take 1 capsule by mouth 2 (two) times daily.   gabapentin (NEURONTIN) 100 MG capsule Take 200 mg by mouth 2 (two) times daily.   Lactobacillus Rhamnosus, GG, (CULTURELLE PO) Take 1 capsule by mouth daily.   meclizine (ANTIVERT) 12.5 MG tablet TAKE 1 TABLET BY MOUTH THREE TIMES DAILYAS NEEDED FOR DIZZINESS   memantine (NAMENDA) 10 MG tablet TAKE 1 TABLET BY MOUTH TWICE DAILY   Multiple Vitamin (MULTIVITAMIN) tablet Take 1 tablet by mouth daily.   Omega-3 Fatty Acids (FISH OIL) 1200 MG CAPS Take 1 capsule by mouth daily.    Polyethyl Glycol-Propyl Glycol 0.4-0.3 % SOLN Apply 1 drop to eye daily as needed (dry eyes).   Red Yeast Rice 600 MG CAPS Take 2 capsules by mouth daily.   No facility-administered encounter medications on file as of 08/22/2021.    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 ([place]) 08/27/2015   Osteoarthritis 08/10/2011   MYOCARDIAL INFARCTION, HX OF 08/01/2010   Fibromyalgia 05/20/2008   Hyperlipidemia 05/29/2007   OBSTRUCTIVE SLEEP APNEA 05/29/2007   Essential hypertension, benign 05/29/2007    Conditions to be addressed/monitored:HTN, HLD, Dementia, and chronic pain and falls prevention   Care Plan : RNCM: General Plan of Care (Adult) for Chronic Disease Management and Care Coordination Needs  Updates made by Vanita Ingles, RN since 08/22/2021  12:00 AM     Problem: RNCM: Development of Plan of Care for Chronic Disease Management( HTN, HLD, Dementia,  Chronic pain, Falls)   Priority: High     Long-Range Goal: RNCM: Effective Management of Plan of Care for Chronic Disease Management( HTN, HLD, Dementia, chronic pain Falls)   Start Date: 06/13/2021  Expected End Date: 06/13/2022  Priority: High  Note:   Current Barriers:  Knowledge Deficits related to plan of care for management of HTN, HLD, Dementia, Chronic Pain,   and Falls Prevention and Safety  Chronic Disease Management support and education needs related to HTN, HLD, and Dementia and falls prevention   RNCM Clinical Goal(s):  Patient will verbalize understanding of plan for management of HTN, HLD, Chronic pain,  Dementia, and Falls prevention and safety as evidenced by regular follow up with pcp, working with the CCM team on regular outreaches, calling the office for changes in conditions or questions, stabilized conditions take all medications exactly as prescribed and will call provider for medication related questions as evidenced by compliance with medications, calling for refills before running out of medications, calling pharm D for questions or concerns    demonstrate a decrease in falls and injury  exacerbations  as evidenced by no new fall or injuries related to falls and safety hazards in the patients surroundings demonstrate ongoing self health care management ability for effective management of chronic conditions as evidenced by  working with the CCM team   through collaboration with Consulting civil engineer, provider, and care team.   Interventions: 1:1 collaboration with primary care provider regarding development and update of comprehensive plan of care as evidenced by provider attestation and co-signature Inter-disciplinary care team collaboration (see longitudinal plan of care) Evaluation of current treatment plan related to  self management and patient's adherence to  plan as established by provider   SDOH Barriers (Status: Goal on Track (progressing): YES.) Long Term Goal  Patient interviewed and SDOH assessment performed        SDOH Interventions    Flowsheet Row Most Recent Value  SDOH Interventions   Food Insecurity Interventions Intervention Not Indicated  Financial Strain Interventions Intervention Not Indicated  Housing Interventions Intervention Not Indicated  Intimate Partner Violence Interventions Intervention Not Indicated  Physical Activity Interventions Other (Comments)  [does her crafts, no structured activity, likes going outside when she can]  Stress Interventions Intervention Not Indicated  Social Connections Interventions Other (Comment)  [good support system]  Transportation Interventions Intervention Not Indicated     Patient interviewed and appropriate assessments performed Provided patient with information about resources available for changes in SDOH and care guides to help with local resources in [facility]  Discussed plans with patient for ongoing care management follow up and provided patient with direct contact information for care management team Advised patient to call the office for changes in [place], questions or concerns    Falls:  (Status: Goal on Track (progressing): YES.) Long Term Goal  Provided written and verbal education re: potential causes of falls and Fall  prevention strategies Reviewed medications and discussed potential side effects of medications such as dizziness and frequent urination Advised patient of importance of notifying provider of falls Assessed for signs and symptoms of orthostatic hypotension Assessed for falls since last encounter. 08-22-2021: no new falls reported.  Assessed patients knowledge of fall risk prevention secondary to previously provided education Screening for signs and symptoms of depression related to chronic disease state Assessed social determinant of health  barriers  Hyperlipidemia:  (Status: Goal on Track (progressing): YES.) Lab Results  Component Value Date   CHOL 227 (H) 11/03/2019   HDL 65 11/03/2019   LDLCALC 136 (H) 11/03/2019   LDLDIRECT 158.1 10/02/2013   TRIG 134 11/03/2019   CHOLHDL 3.5 11/03/2019     Medication review performed; medication list updated in electronic medical record.  Provider established cholesterol goals reviewed; Counseled on importance of regular laboratory monitoring as prescribed. 08-22-2021: The patient reminded of regular lab work for testing.  Provided HLD educational materials; Reviewed role and benefits of statin for ASCVD risk reduction; Discussed strategies to manage statin-induced myalgias; Reviewed importance of limiting foods high in cholesterol. 08-22-2021: Review of heart healthy diet   Hypertension: (Status: Goal on Track (progressing): YES.) Last practice recorded BP readings:  BP Readings from Last 3 Encounters:  05/18/21 (!) 139/47  01/03/21 (!) 135/52  11/29/20 140/64  Self reported blood pressure on 08-21-2021: 152/67 with a pulse of 69 Most recent eGFR/CrCl: No results found for: EGFR  No components found for: CRCL  Evaluation of current treatment plan related to hypertension self management and patient's adherence to plan as established by provider. 08-22-2021: The patient takes blood pressures at home with the assistance of her husband on a regular basis. The patient states it runs good at home. Usually systolic of 191'Y.     Provided education to patient re: stroke prevention, s/s of heart attack and stroke; Reviewed prescribed diet heart healthy. 08-22-2021: Review of heart healthy diet Reviewed medications with patient and discussed importance of compliance. 08-22-2021: Is compliant with medications;  Discussed plans with patient for ongoing care management follow up and provided patient with direct contact information for care management team; Advised patient, providing education and  rationale, to monitor blood pressure daily and record, calling PCP for findings outside established parameters;  Provided education on prescribed diet heart healthy;  Discussed complications of poorly controlled blood pressure such as heart disease, stroke, circulatory complications, vision complications, kidney impairment, sexual dysfunction;    Dementia   (Status: Goal on Track (progressing): YES.) Long Term Goal  Evaluation of current treatment plan related to Dementia, Mental Health Concerns  self-management and patient's adherence to plan as established by provider. 08-22-2021: The patient is stable with her memory at this time. She was happy to see her family at Christmas and enjoyed the grandkids.  Discussed plans with patient for ongoing care management follow up and provided patient with direct contact information for care management team Advised patient to call the office for changes in conditions, changes in mood, questions or concerns; Reviewed medications with patient and discussed compliance. The patient is taking Aricept 10 mg at bedtime and has seen an improvement in her sleep habits ; Discussed plans with patient for ongoing care management follow up and provided patient with direct contact information for care management team; Advised patient to discuss changes in dementia and new concerns  with provider; Screening for signs and symptoms of depression related to chronic disease state;  Assessed social determinant of health barriers;  Pain:  (Status: Goal on Track (progressing): YES.) Long Term Goal  Pain assessment performed. 08-22-2021: The patient rates her pain level at a 10 on a scale of 0-10. Review of pain level and the patient at first said "40". Unclear if the patient understands fully but the patient states it is back pain and it is managed at this time.  Medications reviewed. 08-22-2021: The patient uses Tylenol Arthritis for pain relief, also uses a gel she gets from the  chiropractor that she puts in the freezer that works well for her.  Reviewed provider established plan for pain management; Discussed importance of adherence to all scheduled medical appointments; Counseled on the importance of reporting any/all new or changed pain symptoms or management strategies to pain management provider; Advised patient to report to care team affect of pain on daily activities; Discussed use of relaxation techniques and/or diversional activities to assist with pain reduction (distraction, imagery, relaxation, massage, acupressure, TENS, heat, and cold application. 08-22-2021: The patient states that she uses a heating pad at times and this is helpful for her pain relief.  Reviewed with patient prescribed pharmacological and nonpharmacological pain relief strategies; Advised patient to discuss unresolved pain, changes in level or intensity of pain with provider; Screening for signs and symptoms of depression related to chronic disease state;  Assessed social determinant of health barriers;    Patient Goals/Self-Care Activities: Patient will self administer medications as prescribed as evidenced by self report/primary caregiver report  Patient will attend all scheduled provider appointments as evidenced by clinician review of documented attendance to scheduled appointments and patient/caregiver report Patient will call pharmacy for medication refills as evidenced by patient report and review of pharmacy fill history as appropriate Patient will attend church or other social activities as evidenced by patient report Patient will continue to perform ADL's independently as evidenced by patient/caregiver report Patient will continue to perform IADL's independently as evidenced by patient/caregiver report Patient will call provider office for new concerns or questions as evidenced by review of documented incoming telephone call notes and patient report Patient will work with BSW to  address care coordination needs and will continue to work with the clinical team to address health care and disease management related needs as evidenced by documented adherence to scheduled care management/care coordination appointments - check blood pressure 3 times per week - choose a place to take my blood pressure (home, clinic or office, retail store) - write blood pressure results in a log or diary - learn about high blood pressure - keep a blood pressure log - take blood pressure log to all doctor appointments - call doctor for signs and symptoms of high blood pressure - develop an action plan for high blood pressure - keep all doctor appointments - take medications for blood pressure exactly as prescribed - report new symptoms to your doctor - eat more whole grains, fruits and vegetables, lean meats and healthy fats - call for medicine refill 2 or 3 days before it runs out - take all medications exactly as prescribed - call doctor with any symptoms you believe are related to your medicine - call doctor when you experience any new symptoms - go to all doctor appointments as scheduled - adhere to prescribed diet: heart healthy       Plan:Telephone follow up appointment with care management team member scheduled for:  10-24-2021 at 1 pm  [place], MSN, [place]  [facility] Mobile: [phone]

## 2021-08-22 NOTE — Patient Instructions (Signed)
Visit Information  Thank you for taking time to visit with me today. Please don't hesitate to contact me if I can be of assistance to you before our next scheduled telephone appointment.  Following are the goals we discussed today:  RNCM Clinical Goal(s):  Patient will verbalize understanding of plan for management of HTN, HLD, Chronic pain,  Dementia, and Falls prevention and safety as evidenced by regular follow up with pcp, working with the CCM team on regular outreaches, calling the office for changes in conditions or questions, stabilized conditions take all medications exactly as prescribed and will call provider for medication related questions as evidenced by compliance with medications, calling for refills before running out of medications, calling pharm D for questions or concerns    demonstrate a decrease in falls and injury  exacerbations  as evidenced by no new fall or injuries related to falls and safety hazards in the patients surroundings demonstrate ongoing self health care management ability for effective management of chronic conditions as evidenced by  working with the CCM team   through collaboration with Consulting civil engineer, provider, and care team.    Interventions: 1:1 collaboration with primary care provider regarding development and update of comprehensive plan of care as evidenced by provider attestation and co-signature Inter-disciplinary care team collaboration (see longitudinal plan of care) Evaluation of current treatment plan related to  self management and patient's adherence to plan as established by provider     SDOH Barriers (Status: Goal on Track (progressing): YES.) Long Term Goal  Patient interviewed and SDOH assessment performed        SDOH Interventions     Flowsheet Row Most Recent Value  SDOH Interventions    Food Insecurity Interventions Intervention Not Indicated  Financial Strain Interventions Intervention Not Indicated  Housing Interventions  Intervention Not Indicated  Intimate Partner Violence Interventions Intervention Not Indicated  Physical Activity Interventions Other (Comments)  [does her crafts, no structured activity, likes going outside when she can]  Stress Interventions Intervention Not Indicated  Social Connections Interventions Other (Comment)  [good support system]  Transportation Interventions Intervention Not Indicated       Patient interviewed and appropriate assessments performed Provided patient with information about resources available for changes in SDOH and care guides to help with local resources in [facility]  Discussed plans with patient for ongoing care management follow up and provided patient with direct contact information for care management team Advised patient to call the office for changes in SDOH, questions or concerns       Falls:  (Status: Goal on Track (progressing): YES.) Long Term Goal  Provided written and verbal education re: potential causes of falls and Fall prevention strategies Reviewed medications and discussed potential side effects of medications such as dizziness and frequent urination Advised patient of importance of notifying provider of falls Assessed for signs and symptoms of orthostatic hypotension Assessed for falls since last encounter. 08-22-2021: no new falls reported.  Assessed patients knowledge of fall risk prevention secondary to previously provided education Screening for signs and symptoms of depression related to chronic disease state Assessed social determinant of health barriers   Hyperlipidemia:  (Status: Goal on Track (progressing): YES.)      Lab Results  Component Value Date    CHOL 227 (H) 11/03/2019    HDL 65 11/03/2019    LDLCALC 136 (H) 11/03/2019    LDLDIRECT 158.1 10/02/2013    TRIG 134 11/03/2019    CHOLHDL 3.5 11/03/2019  Medication review performed; medication list updated in electronic medical record.  Provider established  cholesterol goals reviewed; Counseled on importance of regular laboratory monitoring as prescribed. 08-22-2021: The patient reminded of regular lab work for testing.  Provided HLD educational materials; Reviewed role and benefits of statin for ASCVD risk reduction; Discussed strategies to manage statin-induced myalgias; Reviewed importance of limiting foods high in cholesterol. 08-22-2021: Review of heart healthy diet    Hypertension: (Status: Goal on Track (progressing): YES.) Last practice recorded BP readings:     BP Readings from Last 3 Encounters:  05/18/21 (!) 139/47  01/03/21 (!) 135/52  11/29/20 140/64  Self reported blood pressure on 08-21-2021: 152/67 with a pulse of 69 Most recent eGFR/CrCl: No results found for: EGFR  No components found for: CRCL   Evaluation of current treatment plan related to hypertension self management and patient's adherence to plan as established by provider. 08-22-2021: The patient takes blood pressures at home with the assistance of her husband on a regular basis. The patient states it runs good at home. Usually systolic of 759'F.     Provided education to patient re: stroke prevention, s/s of heart attack and stroke; Reviewed prescribed diet heart healthy. 08-22-2021: Review of heart healthy diet Reviewed medications with patient and discussed importance of compliance. 08-22-2021: Is compliant with medications;  Discussed plans with patient for ongoing care management follow up and provided patient with direct contact information for care management team; Advised patient, providing education and rationale, to monitor blood pressure daily and record, calling PCP for findings outside established parameters;  Provided education on prescribed diet heart healthy;  Discussed complications of poorly controlled blood pressure such as heart disease, stroke, circulatory complications, vision complications, kidney impairment, sexual dysfunction;      Dementia   (Status:  Goal on Track (progressing): YES.) Long Term Goal  Evaluation of current treatment plan related to Dementia, Mental Health Concerns  self-management and patient's adherence to plan as established by provider. 08-22-2021: The patient is stable with her memory at this time. She was happy to see her family at Christmas and enjoyed the grandkids.  Discussed plans with patient for ongoing care management follow up and provided patient with direct contact information for care management team Advised patient to call the office for changes in conditions, changes in mood, questions or concerns; Reviewed medications with patient and discussed compliance. The patient is taking Aricept 10 mg at bedtime and has seen an improvement in her sleep habits ; Discussed plans with patient for ongoing care management follow up and provided patient with direct contact information for care management team; Advised patient to discuss changes in dementia and new concerns  with provider; Screening for signs and symptoms of depression related to chronic disease state;  Assessed social determinant of health barriers;       Pain:  (Status: Goal on Track (progressing): YES.) Long Term Goal  Pain assessment performed. 08-22-2021: The patient rates her pain level at a 10 on a scale of 0-10. Review of pain level and the patient at first said "40". Unclear if the patient understands fully but the patient states it is back pain and it is managed at this time.  Medications reviewed. 08-22-2021: The patient uses Tylenol Arthritis for pain relief, also uses a gel she gets from the chiropractor that she puts in the freezer that works well for her.  Reviewed provider established plan for pain management; Discussed importance of adherence to all scheduled medical appointments; Counseled on the  importance of reporting any/all new or changed pain symptoms or management strategies to pain management provider; Advised patient to report to care team  affect of pain on daily activities; Discussed use of relaxation techniques and/or diversional activities to assist with pain reduction (distraction, imagery, relaxation, massage, acupressure, TENS, heat, and cold application. 08-22-2021: The patient states that she uses a heating pad at times and this is helpful for her pain relief.  Reviewed with patient prescribed pharmacological and nonpharmacological pain relief strategies; Advised patient to discuss unresolved pain, changes in level or intensity of pain with provider; Screening for signs and symptoms of depression related to chronic disease state;  Assessed social determinant of health barriers;     Patient Goals/Self-Care Activities: Patient will self administer medications as prescribed as evidenced by self report/primary caregiver report  Patient will attend all scheduled provider appointments as evidenced by clinician review of documented attendance to scheduled appointments and patient/caregiver report Patient will call pharmacy for medication refills as evidenced by patient report and review of pharmacy fill history as appropriate Patient will attend church or other social activities as evidenced by patient report Patient will continue to perform ADL's independently as evidenced by patient/caregiver report Patient will continue to perform IADL's independently as evidenced by patient/caregiver report Patient will call provider office for new concerns or questions as evidenced by review of documented incoming telephone call notes and patient report Patient will work with BSW to address care coordination needs and will continue to work with the clinical team to address health care and disease management related needs as evidenced by documented adherence to scheduled care management/care coordination appointments - check blood pressure 3 times per week - choose a place to take my blood pressure (home, clinic or office, retail store) - write  blood pressure results in a log or diary - learn about high blood pressure - keep a blood pressure log - take blood pressure log to all doctor appointments - call doctor for signs and symptoms of high blood pressure - develop an action plan for high blood pressure - keep all doctor appointments - take medications for blood pressure exactly as prescribed - report new symptoms to your doctor - eat more whole grains, fruits and vegetables, lean meats and healthy fats - call for medicine refill 2 or 3 days before it runs out - take all medications exactly as prescribed - call doctor with any symptoms you believe are related to your medicine - call doctor when you experience any new symptoms - go to all doctor appointments as scheduled - adhere to prescribed diet: heart healthy    Our next appointment is by telephone on 10-24-2021 at 1 pm  Please call the care guide team at [phone] if you need to cancel or reschedule your appointment.   If you are experiencing a Mental Health or [place] or need someone to talk to, please call the Suicide and Crisis Lifeline: 988 call the [place] National Suicide Prevention Lifeline: 540-101-0909 or TTY: (234)802-9993 TTY (614)523-3568) to talk to a trained counselor call 1-800-273-TALK (toll free, 24 hour hotline)   Patient verbalizes understanding of instructions provided today and agrees to view in [place].   Noreene Larsson RN, MSN, [place] Mobile: [phone]

## 2021-09-02 ENCOUNTER — Other Ambulatory Visit: Payer: Self-pay

## 2021-09-02 ENCOUNTER — Ambulatory Visit: Payer: PPO | Admitting: Cardiovascular Disease

## 2021-09-02 ENCOUNTER — Encounter: Payer: Self-pay | Admitting: Cardiovascular Disease

## 2021-09-02 ENCOUNTER — Other Ambulatory Visit: Payer: Self-pay | Admitting: *Deleted

## 2021-09-02 VITALS — BP 158/70 | HR 62 | Ht 67.0 in | Wt 129.2 lb

## 2021-09-02 DIAGNOSIS — I10 Essential (primary) hypertension: Secondary | ICD-10-CM

## 2021-09-02 DIAGNOSIS — I2510 Atherosclerotic heart disease of native coronary artery without angina pectoris: Secondary | ICD-10-CM

## 2021-09-02 DIAGNOSIS — I493 Ventricular premature depolarization: Secondary | ICD-10-CM

## 2021-09-02 MED ORDER — DILTIAZEM HCL ER COATED BEADS 240 MG PO CP24: 240.0000 mg | ORAL_CAPSULE | Freq: Every day | ORAL | 3 refills | Status: DC

## 2021-09-02 NOTE — Progress Notes (Signed)
Cardiology Office Note   Date:  09/02/2021   ID:  Kristin, [name] [birth year], MRN [id number]  PCP:  [name], NP  Cardiologist:   Kathlyn Sacramento, MD   Chief Complaint  Patient presents with   Other    6 Month f/u c/o elevated BP and edema ankles. Meds reviewed verbally with pt.      History of Present Illness: Kristin [name] is a 81 y.o. female who presents for a follow-up visit regarding hypertension and frequent PVCs. 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   She had cardiac catheterization in 2013 which showed no significant [name]. Only mild distal LAD and RCA disease She has known palpitations due to PVCs and ventricular bigeminy.  She has known history of refractory hypertension with negative workup for secondary hypertension.    She used to be on multiple blood pressure medications including Spironolactone and losartan-hydrochlorothiazide.  This was discontinued due to volume depletion and hypotension.  Echocardiogram in 2021 showed normal LV systolic function with no significant valvular abnormalities. She does have gradual memory decline and currently is on Aricept and Namenda.  She is accompanied by her husband today and denies chest pain, shortness of breath or palpitation.  She does have some lower extremity edema.  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 ([place]) 08/27/2015   Cough    no fever    Degeneration of lumbar or lumbosacral intervertebral disc    Dizziness    r/t meds    DVT (deep venous thrombosis) ([facility])    Fibromyalgia    History of shingles    [name] (irritable bowel syndrome)    takes OTC Hardin Negus colon health   Joint pain    Joint swelling    Myalgia and myositis, unspecified    Obstructive sleep apnea (adult) (pediatric)    Old myocardial infarction 1996   Osteoarthritis    Osteoarthrosis, unspecified whether generalized or localized, unspecified site    Psoriatic arthritis ([place])    Rheumatoid arthritis ([place])    Rosacea    Seasonal allergies    Stroke ([place])    ischaemic microvascular disease   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[place])    1991 , bleed and dizziness    Unspecified essential hypertension    takes Diltiazem,Metoprolol,and Losartan daily   UTI (lower urinary tract infection)    frequent=last one was winter of 2014    Past Surgical History:  Procedure Laterality Date   ATHERECTOMY  [place]   vasospams; no blockage   CARDIAC CATHETERIZATION  2013   No obstructive coronary artery disease. Mild distal LAD and proximal RCA disease.   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     ASPIRIN 81 PO Take 1 tablet by mouth daily.     betamethasone dipropionate (DIPROLENE) 0.05 % ointment APPLY TO AFFECTED AREAS TWICE DAILY AS NEEDED      Calcium Carbonate-Vitamin D (CALTRATE 600+D PO) Take 1 tablet by mouth daily at 12 noon.     Cranberry 400 MG TABS Take 1 tablet by mouth daily.     diltiazem (CARDIZEM CD) 240 MG 24 hr capsule Take 1 capsule (240 mg total) by mouth daily. 90 capsule 0   donepezil (ARICEPT) 10 MG tablet Take 10 mg by mouth at bedtime.     DULoxetine (CYMBALTA) 20 MG capsule Take 1 capsule by mouth 2 (two) times daily.     gabapentin (NEURONTIN) 100 MG capsule Take 200 mg by mouth 2 (two) times daily.     Lactobacillus Rhamnosus, GG, (CULTURELLE PO) Take 1 capsule by mouth daily.     meclizine (ANTIVERT) 12.5 MG tablet TAKE 1 TABLET BY MOUTH THREE TIMES DAILYAS NEEDED FOR DIZZINESS 270 tablet 1   memantine (NAMENDA) 10 MG tablet TAKE 1 TABLET BY MOUTH TWICE DAILY 180 tablet 0   Multiple Vitamin (MULTIVITAMIN) tablet Take 1 tablet by mouth daily.     Omega-3 Fatty Acids (FISH OIL) 1200 MG CAPS Take 1 capsule by mouth daily.      Polyethyl Glycol-Propyl Glycol 0.4-0.3 % SOLN Apply 1 drop to eye daily as needed (dry eyes).     Red Yeast Rice 600 MG CAPS Take 2 capsules by mouth daily.     No current facility-administered medications for this visit.    Allergies:   Angiotensin receptor blockers, Other, Hydralazine, Latex, Losartan, Methotrexate derivatives, Spironolactone, and Penicillins    Social History:  The patient  reports that she has never smoked. She has never used smokeless tobacco. She reports that she does not drink alcohol and does not use drugs.   Family History:  The patient's family history includes Breast cancer in her paternal aunt; Colon cancer in her father; Coronary artery disease in her mother; GER disease in her sister and sister; Heart attack in her mother; Heart disease in her mother; Heart failure in her maternal grandfather, maternal grandmother, and mother; Hypertension in her sister; Osteopenia in her sister; Osteoporosis in her mother; Skin cancer in her sister.    ROS:  Please  see the history of present illness.   Otherwise, review of systems are positive for none.   All other systems are reviewed and negative.    PHYSICAL EXAM: VS:  BP (!) 158/70 (BP Location: Left Arm, Patient Position: Sitting, Cuff Size: Normal)    Pulse 62    Ht 5\' 7"  (1.702 m)    Wt 129 lb 4 oz (58.6 kg)    LMP 08/14/1989    SpO2 97%    BMI 20.24 kg/m  , BMI Body mass index is 20.24 kg/m. GEN: Well nourished, well developed, in no acute distress  HEENT: normal  Neck: no JVD, carotid bruits,  or masses Cardiac: RRR; no murmurs, rubs, or gallops, mild bilateral leg edema  Respiratory:  clear to auscultation bilaterally, normal work of breathing GI: soft, nontender, nondistended, + BS MS: no deformity or atrophy  Skin: warm and dry, no rash Neuro:  Strength and sensation are intact Psych: euthymic mood, full affect   EKG:  EKG is ordered today. EKG showed normal sinus rhythm with no significant ST or T wave changes.  Heart rate is 62 bpm.    Recent Labs: 01/14/2021: BUN 18; Creatinine, Ser 1.20; Magnesium 2.1; Potassium 3.9; Sodium 139    Lipid Panel    Component Value Date/Time   CHOL 227 (H) 11/03/2019 0941   TRIG 134 11/03/2019 0941   HDL 65 11/03/2019 0941   CHOLHDL 3.5 11/03/2019 0941   VLDL 21.2 02/21/2018 1035   LDLCALC 136 (H) 11/03/2019 0941   LDLDIRECT 158.1 10/02/2013 0914      Wt Readings from Last 3 Encounters:  09/02/21 129 lb 4 oz (58.6 kg)  05/18/21 122 lb 12.8 oz (55.7 kg)  01/03/21 123 lb 6.4 oz (56 kg)        ASSESSMENT AND PLAN:  1. Coronary artery disease involving native coronary arteries with previous spasm: No anginal symptoms. Continue medical therapy.  2. 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  3.  Essential hypertension: Blood pressure is mildly elevated.  Hydrochlorothiazide was added during last visit but it does not seem to be on her medication list.  I am hesitant to add any new antihypertensive medications given her chronic  complaints of dizziness.  Continue diltiazem for now.  4.  Progressive dementia: Unfortunately, this seems to be gradually worsening.   Disposition:   FU with me in 12 months  Signed,  Kathlyn Sacramento, MD  09/02/2021 10:01 AM    [place]

## 2021-09-02 NOTE — Patient Instructions (Signed)

## 2021-09-05 DIAGNOSIS — S8011XA Contusion of right lower leg, initial encounter: Secondary | ICD-10-CM | POA: Diagnosis not present

## 2021-09-05 DIAGNOSIS — M545 Low back pain, unspecified: Secondary | ICD-10-CM | POA: Diagnosis not present

## 2021-09-05 DIAGNOSIS — R2681 Unsteadiness on feet: Secondary | ICD-10-CM | POA: Diagnosis not present

## 2021-09-05 DIAGNOSIS — R413 Other amnesia: Secondary | ICD-10-CM | POA: Diagnosis not present

## 2021-09-05 DIAGNOSIS — M1711 Unilateral primary osteoarthritis, right knee: Secondary | ICD-10-CM | POA: Diagnosis not present

## 2021-09-05 DIAGNOSIS — M48061 Spinal stenosis, lumbar region without neurogenic claudication: Secondary | ICD-10-CM | POA: Diagnosis not present

## 2021-09-05 DIAGNOSIS — G8929 Other chronic pain: Secondary | ICD-10-CM | POA: Diagnosis not present

## 2021-09-05 DIAGNOSIS — G479 Sleep disorder, unspecified: Secondary | ICD-10-CM | POA: Diagnosis not present

## 2021-09-05 DIAGNOSIS — W108XXA Fall (on) (from) other stairs and steps, initial encounter: Secondary | ICD-10-CM | POA: Diagnosis not present

## 2021-09-05 DIAGNOSIS — M79661 Pain in right lower leg: Secondary | ICD-10-CM | POA: Diagnosis not present

## 2021-09-05 DIAGNOSIS — M7042 Prepatellar bursitis, left knee: Secondary | ICD-10-CM | POA: Diagnosis not present

## 2021-09-05 DIAGNOSIS — R42 Dizziness and giddiness: Secondary | ICD-10-CM | POA: Diagnosis not present

## 2021-09-05 DIAGNOSIS — S8002XA Contusion of left knee, initial encounter: Secondary | ICD-10-CM | POA: Diagnosis not present

## 2021-09-05 DIAGNOSIS — R262 Difficulty in walking, not elsewhere classified: Secondary | ICD-10-CM | POA: Diagnosis not present

## 2021-09-05 DIAGNOSIS — M25562 Pain in left knee: Secondary | ICD-10-CM | POA: Diagnosis not present

## 2021-09-05 DIAGNOSIS — M1712 Unilateral primary osteoarthritis, left knee: Secondary | ICD-10-CM | POA: Diagnosis not present

## 2021-09-13 DIAGNOSIS — I10 Essential (primary) hypertension: Secondary | ICD-10-CM

## 2021-09-13 DIAGNOSIS — E782 Mixed hyperlipidemia: Secondary | ICD-10-CM

## 2021-09-13 DIAGNOSIS — M159 Polyosteoarthritis, unspecified: Secondary | ICD-10-CM

## 2021-09-13 DIAGNOSIS — F0150 Vascular dementia without behavioral disturbance: Secondary | ICD-10-CM

## 2021-09-21 ENCOUNTER — Encounter: Payer: Self-pay | Admitting: Internal Medicine

## 2021-09-21 ENCOUNTER — Other Ambulatory Visit: Payer: Self-pay

## 2021-09-21 ENCOUNTER — Ambulatory Visit (INDEPENDENT_AMBULATORY_CARE_PROVIDER_SITE_OTHER): Payer: PPO | Admitting: Internal Medicine

## 2021-09-21 VITALS — BP 156/62 | HR 68 | Temp 97.1°F | Wt 128.0 lb

## 2021-09-21 DIAGNOSIS — Z111 Encounter for screening for respiratory tuberculosis: Secondary | ICD-10-CM

## 2021-09-21 DIAGNOSIS — F01A0 Vascular dementia, mild, without behavioral disturbance, psychotic disturbance, mood disturbance, and anxiety: Secondary | ICD-10-CM

## 2021-09-21 DIAGNOSIS — Z0289 Encounter for other administrative examinations: Secondary | ICD-10-CM

## 2021-09-21 NOTE — Progress Notes (Signed)
Subjective:    Patient ID: Kristin [name], female    DOB: 1940-11-25, 81 y.o.   MRN: [id number]  HPI  Patient presents to the clinic today for form completion.  She reports she would like to enroll in an adult day program.  She has a history of vascular dementia, CKD, HLD with [name]/aortic atherosclerosis status post stroke.  Review of Systems     Past Medical History:  Diagnosis Date   Allergy    Arthritis    recent falls -aug 2010   [name] (coronary artery disease)    [id number]   Complication of anesthesia    pt states takes a long time to wake her wake up   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 ([place]) 08/27/2015   Cough    no fever    Degeneration of lumbar or lumbosacral intervertebral disc    Dizziness    r/t meds   DVT (deep venous thrombosis) (HCC)    Fibromyalgia    History of shingles    [name] (irritable bowel syndrome)    takes OTC [place] Negus colon health   Joint pain    Joint swelling    Myalgia and myositis, unspecified    Obstructive sleep apnea (adult) (pediatric)    Old myocardial infarction 1996   Osteoarthritis    Osteoarthrosis, unspecified whether generalized or localized, unspecified site    Psoriatic arthritis ([place])    Rheumatoid arthritis ([place])    Rosacea    Seasonal allergies    Stroke [facility])    ischaemic microvascular disease   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[place])    1991 , bleed and dizziness    Unspecified essential hypertension    takes Diltiazem,Metoprolol,and Losartan daily   UTI (lower urinary tract infection)    frequent=last one was winter of 2014    Current Outpatient Medications  Medication Sig Dispense Refill   acetaminophen (TYLENOL) 325 MG tablet Take 650 mg by mouth every 8 (eight) hours as needed (pain).     ASPIRIN 81 PO Take 1 tablet by mouth daily.     betamethasone dipropionate (DIPROLENE) 0.05 % ointment APPLY TO AFFECTED AREAS TWICE DAILY AS NEEDED      Calcium Carbonate-Vitamin D (CALTRATE 600+D PO) Take 1 tablet by mouth daily at 12 noon.     Cranberry 400 MG TABS Take 1 tablet by mouth daily.     diltiazem (CARDIZEM CD) 240 MG 24 hr capsule Take 1 capsule (240 mg total) by mouth daily. 90 capsule 3   donepezil (ARICEPT) 10 MG tablet Take 10 mg by mouth at bedtime.     DULoxetine (CYMBALTA) 20 MG capsule Take 1 capsule by mouth 2 (two) times daily.     gabapentin (NEURONTIN) 100 MG capsule Take 200 mg by mouth 2 (two) times daily.     Lactobacillus Rhamnosus, GG, (CULTURELLE PO) Take 1 capsule by mouth daily.     meclizine (ANTIVERT) 12.5 MG tablet TAKE 1 TABLET BY MOUTH THREE TIMES DAILYAS NEEDED FOR DIZZINESS 270 tablet 1   memantine (NAMENDA) 10 MG tablet TAKE 1 TABLET BY MOUTH TWICE DAILY 180 tablet 0   Multiple Vitamin (MULTIVITAMIN) tablet Take 1 tablet by mouth daily.     Omega-3 Fatty Acids (FISH OIL) 1200 MG CAPS Take 1 capsule by mouth daily.      Polyethyl Glycol-Propyl Glycol 0.4-0.3 % SOLN Apply 1 drop to eye daily as needed (dry eyes).     Red Yeast  Rice 600 MG CAPS Take 2 capsules by mouth daily.     No current facility-administered medications for this visit.    Allergies  Allergen Reactions   Angiotensin Receptor Blockers Anaphylaxis    Fatigue & Dizziness   Other Anaphylaxis    Other reaction(s): Other (See Comments) Dizziness, rash Fatigue & Dizziness   Hydralazine Other (See Comments)    Unknown   Latex     REACTION: Rash, blisters, breathing probs.   Losartan     Dizziness    Methotrexate Derivatives     Dizziness, rash    Spironolactone     Dizziness    Penicillins Hives and Rash    Family History  Problem Relation Age of Onset   Colon cancer Father    Heart attack Mother    Coronary artery disease Mother    Osteoporosis Mother    Heart disease Mother    Heart failure Mother    GER disease Sister    Skin cancer Sister    Hypertension Sister    Osteopenia Sister    GER disease Sister     Breast cancer Paternal Aunt        age 27   Heart failure Maternal Grandmother    Heart failure Maternal Grandfather     Social History   Socioeconomic History   Marital status: Married    Spouse name: Agricultural consultant   Number of children: 2   Years of education: BSN   Highest education level: Not on file  Occupational History   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 She is DNR, [name] is husband [name] , then son [name]       10/22/12-cardiac work up found the reason for her dizziness,  she needs no neurological follow up for that.  her enthusiasm for CPAP remains limited and is explained by her resiudal high AHI after she finally reached compliance ,[place] 13 points.     95% pressure is 11.7 cm , AHI  20 .7- she does not benefit from CPAP . She is allowed to d/c the machine and we need to concerntrate on her dizziness and facial right droop. ordered MRI brain repeat.    Eye exam next week. PT at [facility]. MRI brain in April- per patients request order next month.          Social Determinants of Health   Financial Resource Strain: Low Risk    Difficulty of Paying Living Expenses: Not hard at all  Food Insecurity: No Food Insecurity   Worried About Charity fundraiser in the Last Year: Never true   [place] in the Last Year: Never true  Transportation Needs: No Transportation Needs   Lack of  Transportation (Medical): No   Lack  of Transportation (Non-Medical): No  Physical Activity: Insufficiently Active   Days of Exercise per Week: 3 days   Minutes of Exercise per Session: 20 min  Stress: No Stress Concern Present   Feeling of Stress : Not at all  Social Connections: Moderately Isolated   Frequency of Communication with Friends and Family: More than three times a week   Frequency of Social Gatherings with Friends and Family: More than three times a week   Attends Religious Services: Never   Marine scientist or Organizations: No   Attends Music therapist: Never   Marital Status: Married  Human resources officer Violence: Not At Risk   Fear of Current or Ex-Partner: No   Emotionally Abused: No   Physically Abused: No   Sexually Abused: No     Constitutional: Denies fever, malaise, fatigue, headache or abrupt weight changes.  HEENT: Denies eye pain, eye redness, ear pain, ringing in the ears, wax buildup, runny nose, nasal congestion, bloody nose, or sore throat. Respiratory: Denies difficulty breathing, shortness of breath, cough or sputum production.   Cardiovascular: Denies chest pain, chest tightness, palpitations or swelling in the hands or feet.  Gastrointestinal: Denies abdominal pain, bloating, constipation, diarrhea or blood in the stool.  GU: Denies urgency, frequency, pain with urination, burning sensation, blood in urine, odor or discharge. Musculoskeletal: Patient reports generalized joint pain.  Denies decrease in range of motion, difficulty with gait, muscle pain or joint pain and swelling.  Skin: Denies redness, rashes, lesions or ulcercations.  Neurological: Patient has difficulty with memory.  Denies dizziness, difficulty with speech or problems with balance and coordination.    No other specific complaints in a complete review of systems (except as listed in HPI above).  Objective:   Physical Exam  BP (!) 156/62 (BP Location: Left Arm,  Patient Position: Sitting, Cuff Size: Normal)    Pulse 68    Temp (!) 97.1 F (36.2 C) (Temporal)    Wt 128 lb (58.1 kg)    LMP 08/14/1989    SpO2 100%    BMI 20.05 kg/m   Wt Readings from Last 3 Encounters:  09/02/21 129 lb 4 oz (58.6 kg)  05/18/21 122 lb 12.8 oz (55.7 kg)  01/03/21 123 lb 6.4 oz (56 kg)    General: Appears her stated age, in NAD. Skin: Warm, dry and intact. Cardiovascular: Normal rate and rhythm. S1,S2 noted.  No murmur, rubs or gallops noted. Pulmonary/Chest: Normal effort and positive vesicular breath sounds. No respiratory distress. No wheezes, rales or ronchi noted.  Musculoskeletal: Kyphotic.  Gait slow and steady without device. Neurological: Alert and oriented to person and place.  Has difficulty with recall.     BMET    Component Value Date/Time   NA 139 01/14/2021 0829   NA 136 12/29/2016 1036   K 3.9 01/14/2021 0829   CL 102 01/14/2021 0829   CO2 27 01/14/2021 0829   GLUCOSE 112 (H) 01/14/2021 0829   BUN 18 01/14/2021 0829   BUN 58 (H) 12/29/2016 1036   CREATININE 1.20 (H) 01/14/2021 0829   CREATININE 1.21 (H) 11/03/2019 0941   CALCIUM 9.3 01/14/2021 0829   GFRNONAA 46 (L) 01/14/2021 0829   GFRNONAA 43 (L) 11/03/2019 0941   GFRAA 49 (L) 11/03/2019 0941    Lipid Panel     Component Value Date/Time   CHOL 227 (H) 11/03/2019 0941   TRIG 134 11/03/2019 0941   HDL 65 11/03/2019 0941   CHOLHDL 3.5 11/03/2019 0941  VLDL 21.2 02/21/2018 1035   LDLCALC 136 (H) 11/03/2019 0941    CBC    Component Value Date/Time   WBC 7.4 11/03/2019 0941   RBC 3.83 11/03/2019 0941   HGB 12.1 11/03/2019 0941   HGB 12.1 03/22/2009 1331   HCT 36.8 11/03/2019 0941   HCT 36.1 03/22/2009 1331   PLT 258 11/03/2019 0941   PLT 257 03/22/2009 1331   MCV 96.1 11/03/2019 0941   MCV 97.5 03/22/2009 1331   MCH 31.6 11/03/2019 0941   MCHC 32.9 11/03/2019 0941   RDW 13.7 11/03/2019 0941   RDW 15.9 (H) 03/22/2009 1331   LYMPHSABS 1,177 11/03/2019 0941   LYMPHSABS  1.6 03/22/2009 1331   MONOABS 0.7 06/14/2018 1320   MONOABS 0.6 03/22/2009 1331   EOSABS 370 11/03/2019 0941   EOSABS 0.1 03/22/2009 1331   BASOSABS 81 11/03/2019 0941   BASOSABS 0.1 03/22/2009 1331    Hgb A1C No results found for: HGBA1C         Assessment & Plan:   Encounter for Form Completion with Patient, Vascular Dementia, Screen for TB:  Adult day program form completed and signed TB test required-TB Gold assay ordered today  Advised her to schedule an appointment for her annual exam Webb Silversmith, NP This visit occurred during the SARS-CoV-2 public health emergency.  Safety protocols were in place, including screening questions prior to the visit, additional usage of staff PPE, and extensive cleaning of exam room while observing appropriate contact time as indicated for disinfecting solutions.

## 2021-09-21 NOTE — Patient Instructions (Signed)
Dementia Caregiver Guide °Dementia is a term used to describe a number of symptoms that affect memory and thinking. The most common symptoms include: °Memory loss. °Trouble with language and communication. °Trouble concentrating. °Poor judgment and problems with reasoning. °Wandering from home or public places. °Extreme anxiety or depression. °Being suspicious or having angry outbursts and accusations. °Child-like behavior and language. °Dementia can be frightening and confusing. And taking care of someone with dementia can be challenging. This guide provides tips to help you when providing care for a person with dementia. °How to help manage lifestyle changes °Dementia usually gets worse slowly over time. In the early stages, people with dementia can stay independent and safe with some help. In later stages, they need help with daily tasks such as dressing, grooming, and using the bathroom. There are actions you can take to help a person manage his or her life while living with this condition. °Communicating °When the person is talking or seems frustrated, make eye contact and hold the person's hand. °Ask specific questions that need yes or no answers. °Use simple words, short sentences, and a calm voice. Only give one direction at a time. °When offering choices, limit the person to just one or two. °Avoid correcting the person in a negative way. °If the person is struggling to find the right words, gently try to help him or her. °Preventing injury ° °Keep floors clear of clutter. Remove rugs, magazine racks, and floor lamps. °Keep hallways well lit, especially at night. °Put a handrail and nonslip mat in the bathtub or shower. °Put childproof locks on cabinets that contain dangerous items, such as medicines, alcohol, guns, toxic cleaning items, sharp tools or utensils, matches, and lighters. °For doors to the outside of the house, put the locks in places where the person cannot see or reach them easily. This will  help ensure that the person does not wander out of the house and get lost. °Be prepared for emergencies. Keep a list of emergency phone numbers and addresses in a convenient area. °Remove car keys and lock garage doors so that the person does not try to get in the car and drive. °Have the person wear a bracelet that tracks locations and identifies the person as having memory problems. This should be worn at all times for safety. °Helping with daily life ° °Keep the person on track with his or her routine. °Try to identify areas where the person may need help. °Be supportive, patient, calm, and encouraging. °Gently remind the person that adjusting to changes takes time. °Help with the tasks that the person has asked for help with. °Keep the person involved in daily tasks and decisions as much as possible. °Encourage conversation, but try not to get frustrated if the person struggles to find words or does not seem to appreciate your help. °How to recognize stress °Look for signs of stress in yourself and in the person you are caring for. If you notice signs of stress, take steps to manage it. Symptoms of stress include: °Feeling anxious, irritable, frustrated, or angry. °Denying that the person has dementia or that his or her symptoms will not improve. °Feeling depressed, hopeless, or unappreciated. °Difficulty sleeping. °Difficulty concentrating. °Developing stress-related health problems. °Feeling like you have too little time for your own life. °Follow these instructions at home: °Take care of your health °Make sure that you and the person you are caring for: °Get regular sleep. °Exercise regularly. °Eat regular, nutritious meals. °Take over-the-counter and prescription medicines only   as told by your health care providers. °Drink enough fluid to keep your urine pale yellow. °Attend all scheduled health care appointments. ° °General instructions °Join a support group with others who are caregivers. °Ask about  respite care resources. Respite care can provide short-term care for the person so that you can have a regular break from the stress of caregiving. °Consider any safety risks and take steps to avoid them. °Organize medicines in a pill box for each day of the week. °Create a plan to handle any legal or financial matters. Get legal or financial advice if needed. °Keep a calendar in a central location to remind the person of appointments or other activities. °Where to find support: °Many individuals and organizations offer support. These include: °Support groups for people with dementia. °Support groups for caregivers. °Counselors or therapists. °Home health care services. °Adult day care centers. °Where to find more information °Centers for Disease Control and Prevention: www.cdc.gov °Alzheimer's Association: www.alz.org °Family Caregiver Alliance: www.caregiver.org °Alzheimer's Foundation of [place]: www.alzfdn.org °Contact a health care provider if: °The person's health is rapidly getting worse. °You are no longer able to care for the person. °Caring for the person is affecting your physical and emotional health. °You are feeling depressed or anxious about caring for the person. °Get help right away if: °The person threatens himself or herself, you, or anyone else. °You feel depressed or sad, or feel that you want to harm yourself. °If you ever feel like your loved one may hurt himself or herself or others, or if he or she shares thoughts about taking his or her own life, get help right away. You can go to your nearest emergency department or: °Call your local emergency services (911 in the [place] °Call a suicide crisis helpline, such as the National Suicide Prevention Lifeline at 1-800-273-8255 or 988 in the [place] This is open 24 hours a day in the [place] °Text the Crisis Text Line at 741741 (in the [place] °Summary °Dementia is a term used to describe a number of symptoms that affect memory and thinking. °Dementia  usually gets worse slowly over time. °Take steps to reduce the person's risk of injury and to plan for future care. °Caregivers need support, relief from caregiving, and time for their own lives. °This information is not intended to replace advice given to you by your health care provider. Make sure you discuss any questions you have with your health care provider. °Document Revised: 02/23/2021 Document Reviewed: 12/15/2019 °Elsevier Patient Education © 2022 Elsevier Inc. ° °

## 2021-09-21 NOTE — Assessment & Plan Note (Signed)
I think it would be good for her to start an adult day program

## 2021-09-23 ENCOUNTER — Telehealth: Payer: Self-pay

## 2021-09-23 NOTE — Telephone Encounter (Signed)
Copied from [place] [id number]. Topic: General - Call Back - No Documentation >> Sep 23, 2021  3:32 PM Erick Blinks wrote: Reason for CRM: Pt's Husband Janayia Burggraf wants to know if the forms they dropped off this week are filled out and ready for pick up. These forms are for [facility] + TB Test  Best contact: [phone]

## 2021-09-24 LAB — QUANTIFERON-TB GOLD PLUS
Mitogen-NIL: 9.27 IU/mL
NIL: 0.05 IU/mL
QuantiFERON-TB Gold Plus: NEGATIVE
TB1-NIL: <0 IU/mL
TB2-NIL: <0 IU/mL

## 2021-09-25 NOTE — Telephone Encounter (Signed)
The TB test came back late Friday. The form is completed, will be ready for pickup first thing Monday morning.

## 2021-09-26 ENCOUNTER — Ambulatory Visit (INDEPENDENT_AMBULATORY_CARE_PROVIDER_SITE_OTHER): Payer: PPO | Admitting: Pharmacist

## 2021-09-26 ENCOUNTER — Telehealth: Payer: Self-pay | Admitting: Internal Medicine

## 2021-09-26 DIAGNOSIS — I10 Essential (primary) hypertension: Secondary | ICD-10-CM

## 2021-09-26 NOTE — Telephone Encounter (Signed)
Called [facility] and spoke to Lucendia Herrlich, Development worker, international aid about the forms dropped off by the patient's husband. She says the form was filled out by [facility] on the back page and signed by her in blue ink. She says the front page is filled out in black ink. Her concern is that on the back page the General information section that is highlighted that says: Do you recommended any special activity for this client? She says it was checked No in blue ink, the No was crossed out in black ink and check Yes in black ink. She is asking did the provider or staff do that and not add an initial, asking if there is a copy to compare. She's asking for a return call to clarify this issue. CB#[phone]

## 2021-09-26 NOTE — Telephone Encounter (Signed)
Copied from [place] [id number]. Topic: General - Other >> Sep 26, 2021 12:02 PM Leward Quan A wrote: Reason for CRM: Lucendia Herrlich ED with [facility] called in needing to speak to a nurse regarding the paper work that was brought in today she had a question about the TB test result in the date of test has patient DOB.Asking for a call back at Ph# [phone]

## 2021-09-26 NOTE — Patient Instructions (Signed)
Visit Information  Thank you for taking time to visit with me today. Please don't hesitate to contact me if I can be of assistance to you before our next scheduled telephone appointment.  Following are the goals we discussed today:   Goals Addressed             This Visit's Progress    Pharmacy goals       Please check blood pressure at home, keep log, and bring to medical appointments  Feel free to call me with any questions or concerns. I look forward to our next call!  Wallace Cullens, PharmD, [place], CPP Clinical Pharmacist [facility] [phone]          Our next appointment is by telephone on 6/26 at 2 pm  Please call the care guide team at [phone] if you need to cancel or reschedule your appointment.    Patient verbalizes understanding of instructions and care plan provided today and agrees to view in [facility]. Active MyChart status confirmed with patient.

## 2021-09-26 NOTE — Chronic Care Management (AMB) (Signed)
Chronic Care Management CCM Pharmacy Note  09/26/2021 Name:  Kristin [name] MRN:  [id number] DOB:  01/17/1941   Subjective: Kristin [name] is an 81 y.o. year old female who is a primary patient of [name], NP.  The CCM team was consulted for assistance with disease management and care coordination needs.    Engaged with patient by telephone for follow up visit for pharmacy case management and/or care coordination services.   Objective:  Medications Reviewed Today     Reviewed by Rennis Petty, RPH-CPP (Pharmacist) on 09/26/21 at 1452  Med List Status: <None>   Medication Order Taking? Sig Documenting Provider Last Dose Status Informant  acetaminophen (TYLENOL) 325 MG tablet [id number] Yes Take 650 mg by mouth every 8 (eight) hours as needed (pain). [provider] Taking Active Self  ASPIRIN 81 PO [id number] Yes Take 1 tablet by mouth daily. [provider] Taking Active Self  betamethasone dipropionate (DIPROLENE) 0.05 % ointment [id number] No APPLY TO AFFECTED AREAS TWICE DAILY AS NEEDED  Patient not taking: Reported on 09/26/2021   [provider] Not Taking Active   Calcium Carbonate-Vitamin D (CALTRATE 600+D PO) [id number] Yes Take 1 tablet by mouth daily at 12 noon. [provider] Taking Active Self  Cranberry 400 MG TABS [id number] Yes Take 1 tablet by mouth daily. [provider] Taking Active   diltiazem (CARDIZEM CD) 240 MG 24 hr capsule [id number] Yes Take 1 capsule (240 mg total) by mouth daily. Wellington Hampshire, MD Taking Active   donepezil (ARICEPT) 10 MG tablet [id number] Yes Take 10 mg by mouth at bedtime. [provider] Taking Active   DULoxetine (CYMBALTA) 20 MG capsule [id number] Yes Take 1 capsule by mouth 2 (two) times daily. [provider] Taking Active     Discontinued 09/21/21 1042 (Dose change)   gabapentin (NEURONTIN) 300 MG capsule [id number] Yes Take 300 mg by mouth 2 (two) times daily.  [provider] Taking Active   Lactobacillus Rhamnosus, GG, (CULTURELLE PO) [id number] Yes Take 1 capsule by mouth daily. [provider] Taking Active   meclizine (ANTIVERT) 12.5 MG tablet [id number] Yes TAKE 1 TABLET BY MOUTH THREE TIMES DAILYAS NEEDED FOR DIZZINESS Verl Bangs, FNP Taking Active   memantine (NAMENDA) 10 MG tablet [id number] Yes TAKE 1 TABLET BY MOUTH TWICE DAILY Baity, Coralie Keens, NP Taking Active   Multiple Vitamin (MULTIVITAMIN) tablet [id number] Yes Take 1 tablet by mouth daily. [provider] Taking Active Self  Omega-3 Fatty Acids (FISH OIL) 1200 MG CAPS [id number] Yes Take 1 capsule by mouth daily.  [provider] Taking Active Self  Polyethyl Glycol-Propyl Glycol 0.4-0.3 % SOLN [id number] Yes Apply 1 drop to eye daily as needed (dry eyes). [provider] Taking Active Self  Red Yeast Rice 600 MG CAPS [id number] Yes Take 2 capsules by mouth daily. [provider] Taking Active             Pertinent Labs:   Lab Results  Component Value Date   CREATININE 1.20 (H) 01/14/2021   BUN 18 01/14/2021   NA 139 01/14/2021   K 3.9 01/14/2021   CL 102 01/14/2021   CO2 27 01/14/2021   BP Readings from Last 3 Encounters:  09/21/21 (!) 156/62  09/02/21 (!) 158/70  05/18/21 (!) 139/47   Pulse Readings from Last 3 Encounters:  09/21/21 68  09/02/21 62  05/18/21 (!) 57     SDOH:  (Social Determinants of Health) assessments and  interventions performed:    CCM Care Plan  Review of patient past medical history, allergies, medications, health status, including review of consultants reports, laboratory and other test data, was performed as part of comprehensive evaluation and provision of chronic care management services.   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   Interventions: 1:1 collaboration with Webb Silversmith, NP regarding development and update of comprehensive plan of care as evidenced by provider attestation and co-signature Inter-disciplinary care team collaboration (see longitudinal plan of care) Perform chart review Patient seen for Office Visit with PCP on 2/8 for form completion to enroll in an adult day program Office Visit with [place] on 1/23 Office Visit with [facility] on 1/23. Provider advised: Continue aricept 10 mg nightly Continue namenda 10 mg twice daily Increase gabapentin to 300 mg in the morning and 300 mg in the evening Continue cymbalta 20 mg twice daily Complete balance exercises for 5 minutes daily at 9:00 AM Office Visit with [facility] on 1/20 Comprehensive medication review performed; medication list updated in electronic medical record Today report patient healing well since fall, bruises healing and following instructions from Orthopedics and Sports Medicine. Reports doing home exercises for lower legs as well as balance exercises from Neurologist Encourage husband to continue to use weekly pillbox to organize patient's medications  Hypertension Current treatment:  Diltiazem CD 240 mg once daily Denies checking home reading recently Encourage patient/spouse to keep a log when checks home BP, contact  Cardiology for readings outside established parameters and to bring to medical appointments  Patient Goals/Self-Care Activities Over the next 90 days, patient/spouse will:  - check blood pressure, document, and provide at future appointments - attend all scheduled provider appointments with help of spouse  Follow Up Plan: Telephone follow up appointment with care management team member scheduled for: 6/26 at 2 pm       Wallace Cullens, PharmD, [place], [place] [phone]

## 2021-09-26 NOTE — Telephone Encounter (Signed)
I did fill this out in black ink in the office but ended up having to fill it out in blue ink at home.  However the section that ask about any special activities, there are no special activities that I am aware of that she needs.

## 2021-09-29 NOTE — Telephone Encounter (Signed)
Form has been emailed to [employer].   Thanks,   -Mickel Baas

## 2021-10-10 DIAGNOSIS — S8011XD Contusion of right lower leg, subsequent encounter: Secondary | ICD-10-CM | POA: Diagnosis not present

## 2021-10-10 DIAGNOSIS — M79661 Pain in right lower leg: Secondary | ICD-10-CM | POA: Diagnosis not present

## 2021-10-10 DIAGNOSIS — M1711 Unilateral primary osteoarthritis, right knee: Secondary | ICD-10-CM | POA: Diagnosis not present

## 2021-10-10 DIAGNOSIS — M25562 Pain in left knee: Secondary | ICD-10-CM | POA: Diagnosis not present

## 2021-10-10 DIAGNOSIS — M1712 Unilateral primary osteoarthritis, left knee: Secondary | ICD-10-CM | POA: Diagnosis not present

## 2021-10-10 DIAGNOSIS — W108XXD Fall (on) (from) other stairs and steps, subsequent encounter: Secondary | ICD-10-CM | POA: Diagnosis not present

## 2021-10-10 DIAGNOSIS — M7042 Prepatellar bursitis, left knee: Secondary | ICD-10-CM | POA: Diagnosis not present

## 2021-10-10 DIAGNOSIS — S8002XD Contusion of left knee, subsequent encounter: Secondary | ICD-10-CM | POA: Diagnosis not present

## 2021-10-10 DIAGNOSIS — I700 Atherosclerosis of aorta: Secondary | ICD-10-CM | POA: Diagnosis not present

## 2021-10-11 DIAGNOSIS — I10 Essential (primary) hypertension: Secondary | ICD-10-CM

## 2021-10-24 ENCOUNTER — Ambulatory Visit (INDEPENDENT_AMBULATORY_CARE_PROVIDER_SITE_OTHER): Payer: PPO

## 2021-10-24 ENCOUNTER — Telehealth: Payer: PPO

## 2021-10-24 DIAGNOSIS — Z9181 History of falling: Secondary | ICD-10-CM

## 2021-10-24 DIAGNOSIS — I10 Essential (primary) hypertension: Secondary | ICD-10-CM

## 2021-10-24 DIAGNOSIS — M159 Polyosteoarthritis, unspecified: Secondary | ICD-10-CM

## 2021-10-24 DIAGNOSIS — M150 Primary generalized (osteo)arthritis: Secondary | ICD-10-CM

## 2021-10-24 DIAGNOSIS — R413 Other amnesia: Secondary | ICD-10-CM

## 2021-10-24 DIAGNOSIS — F01A0 Vascular dementia, mild, without behavioral disturbance, psychotic disturbance, mood disturbance, and anxiety: Secondary | ICD-10-CM

## 2021-10-24 DIAGNOSIS — E782 Mixed hyperlipidemia: Secondary | ICD-10-CM

## 2021-10-24 NOTE — Patient Instructions (Signed)
Visit Information  Thank you for taking time to visit with me today. Please don't hesitate to contact me if I can be of assistance to you before our next scheduled telephone appointment.  Following are the goals we discussed today:  RNCM Clinical Goal(s):  Patient will verbalize understanding of plan for management of HTN, HLD, Chronic pain,  Dementia, and Falls prevention and safety as evidenced by regular follow up with pcp, working with the CCM team on regular outreaches, calling the office for changes in conditions or questions, stabilized conditions take all medications exactly as prescribed and will call provider for medication related questions as evidenced by compliance with medications, calling for refills before running out of medications, calling pharm D for questions or concerns    demonstrate a decrease in falls and injury  exacerbations  as evidenced by no new fall or injuries related to falls and safety hazards in the patients surroundings demonstrate ongoing self health care management ability for effective management of chronic conditions as evidenced by  working with the CCM team   through collaboration with Consulting civil engineer, provider, and care team.    Interventions: 1:1 collaboration with primary care provider regarding development and update of comprehensive plan of care as evidenced by provider attestation and co-signature Inter-disciplinary care team collaboration (see longitudinal plan of care) Evaluation of current treatment plan related to  self management and patient's adherence to plan as established by provider     SDOH Barriers (Status: Goal on Track (progressing): YES.) Long Term Goal  Patient interviewed and SDOH assessment performed        SDOH Interventions     Flowsheet Row Most Recent Value  SDOH Interventions    Food Insecurity Interventions Intervention Not Indicated  Financial Strain Interventions Intervention Not Indicated  Housing Interventions  Intervention Not Indicated  Intimate Partner Violence Interventions Intervention Not Indicated  Physical Activity Interventions Other (Comments)  [does her crafts, no structured activity, likes going outside when she can]  Stress Interventions Intervention Not Indicated  Social Connections Interventions Other (Comment)  [good support system]  Transportation Interventions Intervention Not Indicated       Patient interviewed and appropriate assessments performed Provided patient with information about resources available for changes in SDOH and care guides to help with local resources in [facility]  Discussed plans with patient for ongoing care management follow up and provided patient with direct contact information for care management team Advised patient to call the office for changes in SDOH, questions or concerns       Falls:  (Status: Goal on Track (progressing): YES.) Long Term Goal  Provided written and verbal education re: potential causes of falls and Fall prevention strategies Reviewed medications and discussed potential side effects of medications such as dizziness and frequent urination Advised patient of importance of notifying provider of falls. 10-24-2021: Review and education provided. The patient denies any new falls. Assessed for signs and symptoms of orthostatic hypotension Assessed for falls since last encounter. 10-24-2021: no new falls reported. The patient states that she is being safe and denies any falls. The patient is mindful of safety measures.  Assessed patients knowledge of fall risk prevention secondary to previously provided education Screening for signs and symptoms of depression related to chronic disease state Assessed social determinant of health barriers   Hyperlipidemia:  (Status: Goal on Track (progressing): YES.)      Lab Results  Component Value Date    CHOL 227 (H) 11/03/2019    HDL 65  11/03/2019    LDLCALC 136 (H) 11/03/2019    LDLDIRECT  158.1 10/02/2013    TRIG 134 11/03/2019    CHOLHDL 3.5 11/03/2019  10-24-2021: The patient will have new lab work in May for evaluation of cholesterol levels    Medication review performed; medication list updated in electronic medical record. 10-24-2021: The patient takes Omega 3 fatty acids  Provider established cholesterol goals reviewed; Counseled on importance of regular laboratory monitoring as prescribed. 10-24-2021: The patient reminded of regular lab work for testing.  Provided HLD educational materials; Reviewed role and benefits of statin for ASCVD risk reduction; Discussed strategies to manage statin-induced myalgias; Reviewed importance of limiting foods high in cholesterol. 10-24-2021: Review of heart healthy diet    Hypertension: (Status: Goal on Track (progressing): YES.) Last practice recorded BP readings:     BP Readings from Last 3 Encounters:  09/21/21 (!) 156/62  09/02/21 (!) 158/70  05/18/21 (!) 139/47  Self reported blood pressure on 08-21-2021: 152/67 with a pulse of 69 Most recent eGFR/CrCl: No results found for: EGFR  No components found for: CRCL   Evaluation of current treatment plan related to hypertension self management and patient's adherence to plan as established by provider. 08-22-2021: The patient takes blood pressures at home with the assistance of her husband on a regular basis. The patient states it runs good at home. Usually systolic of 211'H.   10-24-2021: The patient states that they are not taking her blood pressures at home as it is "not working out". She does go to the hospital periodically to have her blood pressures taken and it is "up and down". The patient states that sometimes she gets a headache when her blood pressure is elevated. She denies any headaches or discomfort today. Education and support given.   Provided education to patient re: stroke prevention, s/s of heart attack and stroke; Reviewed prescribed diet heart healthy. 10-24-2021: Review of  heart healthy diet Reviewed medications with patient and discussed importance of compliance. 10-24-2021: Is compliant with medications;  Discussed plans with patient for ongoing care management follow up and provided patient with direct contact information for care management team; Advised patient, providing education and rationale, to monitor blood pressure daily and record, calling PCP for findings outside established parameters;  Provided education on prescribed diet heart healthy;  Discussed complications of poorly controlled blood pressure such as heart disease, stroke, circulatory complications, vision complications, kidney impairment, sexual dysfunction;      Dementia   (Status: Goal on Track (progressing): YES.) Long Term Goal  Evaluation of current treatment plan related to Dementia, Mental Health Concerns  self-management and patient's adherence to plan as established by provider. 08-22-2021: The patient is stable with her memory at this time. She was happy to see her family at Christmas and enjoyed the grandkids. 10-24-2021: The patient is doing well today. Enjoying the pretty weather. The patient states that she is going to the day care and enjoys it. Her husband was not available to talk to today. The patient feels she is doing well at this time. Denies any acute findings today.  Discussed plans with patient for ongoing care management follow up and provided patient with direct contact information for care management team Advised patient to call the office for changes in conditions, changes in mood, questions or concerns; Reviewed medications with patient and discussed compliance. The patient is taking Aricept 10 mg at bedtime and has seen an improvement in her sleep habits. 10-24-2021: The patient states she does  not feel that she sleeps the best. She goes to bed early and gets up about 6 am every morning. Encouraged the patient to talk to the pcp about her sleeping habits at her next visit.  ; Discussed plans with patient for ongoing care management follow up and provided patient with direct contact information for care management team; Advised patient to discuss changes in dementia and new concerns  with provider; Screening for signs and symptoms of depression related to chronic disease state;  Assessed social determinant of health barriers;       Pain:  (Status: Goal on Track (progressing): YES.) Long Term Goal  Pain assessment performed. 08-22-2021: The patient rates her pain level at a 10 on a scale of 0-10. Review of pain level and the patient at first said "40". Unclear if the patient understands fully but the patient states it is back pain and it is managed at this time. 10-24-2021: Denies any pain or discomfort today.  Medications reviewed. 08-22-2021: The patient uses Tylenol Arthritis for pain relief, also uses a gel she gets from the chiropractor that she puts in the freezer that works well for her.  Reviewed provider established plan for pain management; Discussed importance of adherence to all scheduled medical appointments; Counseled on the importance of reporting any/all new or changed pain symptoms or management strategies to pain management provider; Advised patient to report to care team affect of pain on daily activities; Discussed use of relaxation techniques and/or diversional activities to assist with pain reduction (distraction, imagery, relaxation, massage, acupressure, TENS, heat, and cold application. 08-22-2021: The patient states that she uses a heating pad at times and this is helpful for her pain relief.  Reviewed with patient prescribed pharmacological and nonpharmacological pain relief strategies; Advised patient to discuss unresolved pain, changes in level or intensity of pain with provider; Screening for signs and symptoms of depression related to chronic disease state;  Assessed social determinant of health barriers;     Patient Goals/Self-Care  Activities: Patient will self administer medications as prescribed as evidenced by self report/primary caregiver report  Patient will attend all scheduled provider appointments as evidenced by clinician review of documented attendance to scheduled appointments and patient/caregiver report Patient will call pharmacy for medication refills as evidenced by patient report and review of pharmacy fill history as appropriate Patient will attend church or other social activities as evidenced by patient report Patient will continue to perform ADL's independently as evidenced by patient/caregiver report Patient will continue to perform IADL's independently as evidenced by patient/caregiver report Patient will call provider office for new concerns or questions as evidenced by review of documented incoming telephone call notes and patient report Patient will work with BSW to address care coordination needs and will continue to work with the clinical team to address health care and disease management related needs as evidenced by documented adherence to scheduled care management/care coordination appointments - check blood pressure 3 times per week - choose a place to take my blood pressure (home, clinic or office, retail store) - write blood pressure results in a log or diary - learn about high blood pressure - keep a blood pressure log - take blood pressure log to all doctor appointments - call doctor for signs and symptoms of high blood pressure - develop an action plan for high blood pressure - keep all doctor appointments - take medications for blood pressure exactly as prescribed - report new symptoms to your doctor - eat more whole grains, fruits and  vegetables, lean meats and healthy fats - call for medicine refill 2 or 3 days before it runs out - take all medications exactly as prescribed - call doctor with any symptoms you believe are related to your medicine - call doctor when you experience  any new symptoms - go to all doctor appointments as scheduled - adhere to prescribed diet: heart healthy    Our next appointment is by telephone on 01-16-2022 at 1 pm  Please call the care guide team at [phone] if you need to cancel or reschedule your appointment.   If you are experiencing a Mental Health or [place] or need someone to talk to, please call the Suicide and Crisis Lifeline: 988 call the [place] National Suicide Prevention Lifeline: 916-603-5912 or TTY: 684-659-2627 TTY (430)393-1978) to talk to a trained counselor call 1-800-273-TALK (toll free, 24 hour hotline)   Patient verbalizes understanding of instructions and care plan provided today and agrees to view in [place]. Active MyChart status confirmed with patient.    Noreene Larsson RN, MSN, [place] Mobile: [phone]

## 2021-10-24 NOTE — Chronic Care Management (AMB) (Signed)
Chronic Care Management   CCM RN Visit Note  10/24/2021 Name: Kristin [name] MRN: [id number] DOB: Nov 26, 1940  Subjective: Kristin [name] is a 81 y.o. year old female who is a primary care patient of [name], NP. The care management team was consulted for assistance with disease management and care coordination needs.    Engaged with patient by telephone for follow up visit in response to provider referral for case management and/or care coordination services.   Consent to Services:  The patient was given information about Chronic Care Management services, agreed to services, and gave verbal consent prior to initiation of services.  Please see initial visit note for detailed documentation.   Patient agreed to services and verbal consent obtained.   Assessment: Review of patient past medical history, allergies, medications, health status, including review of consultants reports, laboratory and other test data, was performed as part of comprehensive evaluation and provision of chronic care management services.   SDOH (Social Determinants of Health) assessments and interventions performed:    CCM Care Plan  Allergies  Allergen Reactions   Angiotensin Receptor Blockers Anaphylaxis    Fatigue & Dizziness   Other Anaphylaxis    Other reaction(s): Other (See Comments) Dizziness, rash Fatigue & Dizziness   Hydralazine Other (See Comments)    Unknown   Latex     REACTION: Rash, blisters, breathing probs.   Losartan     Dizziness    Methotrexate Derivatives     Dizziness, rash    Spironolactone     Dizziness    Penicillins Hives and Rash    Outpatient Encounter Medications as of 10/24/2021  Medication Sig   acetaminophen (TYLENOL) 325 MG tablet Take 650 mg by mouth every 8 (eight) hours as needed (pain).   ASPIRIN 81 PO Take 1 tablet by mouth daily.   betamethasone dipropionate (DIPROLENE) 0.05 % ointment APPLY TO AFFECTED AREAS TWICE DAILY AS NEEDED (Patient not  taking: Reported on 09/26/2021)   Calcium Carbonate-Vitamin D (CALTRATE 600+D PO) Take 1 tablet by mouth daily at 12 noon.   Cranberry 400 MG TABS Take 1 tablet by mouth daily.   diltiazem (CARDIZEM CD) 240 MG 24 hr capsule Take 1 capsule (240 mg total) by mouth daily.   donepezil (ARICEPT) 10 MG tablet Take 10 mg by mouth at bedtime.   DULoxetine (CYMBALTA) 20 MG capsule Take 1 capsule by mouth 2 (two) times daily.   gabapentin (NEURONTIN) 300 MG capsule Take 300 mg by mouth 2 (two) times daily.   Lactobacillus Rhamnosus, GG, (CULTURELLE PO) Take 1 capsule by mouth daily.   meclizine (ANTIVERT) 12.5 MG tablet TAKE 1 TABLET BY MOUTH THREE TIMES DAILYAS NEEDED FOR DIZZINESS   memantine (NAMENDA) 10 MG tablet TAKE 1 TABLET BY MOUTH TWICE DAILY   Multiple Vitamin (MULTIVITAMIN) tablet Take 1 tablet by mouth daily.   Omega-3 Fatty Acids (FISH OIL) 1200 MG CAPS Take 1 capsule by mouth daily.    Polyethyl Glycol-Propyl Glycol 0.4-0.3 % SOLN Apply 1 drop to eye daily as needed (dry eyes).   Red Yeast Rice 600 MG CAPS Take 2 capsules by mouth daily.   [DISCONTINUED] gabapentin (NEURONTIN) 100 MG capsule Take 200 mg by mouth 2 (two) times daily.   No facility-administered encounter medications on file as of 10/24/2021.    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 ([place]) 08/27/2015   Osteoarthritis 08/10/2011   MYOCARDIAL INFARCTION, HX OF 08/01/2010   Fibromyalgia 05/20/2008   Hyperlipidemia 05/29/2007   OBSTRUCTIVE SLEEP APNEA 05/29/2007   Essential hypertension, benign 05/29/2007    Conditions to be addressed/monitored:HTN, HLD, Dementia, and Falls and Chronic pain and discomfort  Care Plan : RNCM:  General Plan of Care (Adult) for Chronic Disease Management and Care Coordination Needs  Updates made by Vanita Ingles, RN since 10/24/2021 12:00 AM     Problem: RNCM: Development of Plan of Care for Chronic Disease Management( HTN, HLD, Dementia,  Chronic pain, Falls)   Priority: High     Long-Range Goal: RNCM: Effective Management of Plan of Care for Chronic Disease Management( HTN, HLD, Dementia, chronic pain Falls)   Start Date: 06/13/2021  Expected End Date: 06/13/2022  Priority: High  Note:   Current Barriers:  Knowledge Deficits related to plan of care for management of HTN, HLD, Dementia, Chronic Pain,   and Falls Prevention and Safety  Chronic Disease Management support and education needs related to HTN, HLD, and Dementia and falls prevention   RNCM Clinical Goal(s):  Patient will verbalize understanding of plan for management of HTN, HLD, Chronic pain,  Dementia, and Falls prevention and safety as evidenced by regular follow up with pcp, working with the CCM team on regular outreaches, calling the office for changes in conditions or questions, stabilized conditions take all medications exactly as prescribed and will call provider for medication related questions as evidenced by compliance with medications, calling for refills before running out of medications, calling pharm D for questions or concerns    demonstrate a decrease in falls and injury  exacerbations  as evidenced by no new fall or injuries related to falls and safety hazards in the patients surroundings demonstrate ongoing self health care management ability for effective management of chronic conditions as evidenced by  working with the CCM team   through collaboration with Consulting civil engineer, provider, and care team.   Interventions: 1:1 collaboration with primary care provider regarding development and update of comprehensive plan of care as evidenced by provider attestation and co-signature Inter-disciplinary care  team collaboration (see longitudinal plan of care) Evaluation of current treatment plan related to  self management and patient's adherence to plan as established by provider   SDOH Barriers (Status: Goal on Track (progressing): YES.) Long Term Goal  Patient interviewed and SDOH assessment performed        SDOH Interventions    Flowsheet Row Most Recent Value  SDOH Interventions   Food Insecurity Interventions Intervention Not Indicated  Financial Strain Interventions Intervention Not Indicated  Housing Interventions Intervention Not Indicated  Intimate Partner Violence Interventions Intervention Not Indicated  Physical Activity Interventions Other (Comments)  [does her crafts, no structured activity, likes going outside when she can]  Stress Interventions Intervention Not Indicated  Social Connections Interventions Other (Comment)  [good support system]  Transportation Interventions Intervention Not Indicated     Patient interviewed and appropriate assessments performed Provided patient with information about resources available for changes in SDOH and care guides to help with local resources in [facility]  Discussed plans with patient for ongoing care management follow up and provided patient with direct contact information for care management team Advised patient to call the office for changes in SDOH, questions or concerns    Falls:  (  Status: Goal on Track (progressing): YES.) Long Term Goal  Provided written and verbal education re: potential causes of falls and Fall prevention strategies Reviewed medications and discussed potential side effects of medications such as dizziness and frequent urination Advised patient of importance of notifying provider of falls. 10-24-2021: Review and education provided. The patient denies any new falls. Assessed for signs and symptoms of orthostatic hypotension Assessed for falls since last encounter. 10-24-2021: no new falls reported. The  patient states that she is being safe and denies any falls. The patient is mindful of safety measures.  Assessed patients knowledge of fall risk prevention secondary to previously provided education Screening for signs and symptoms of depression related to chronic disease state Assessed social determinant of health barriers  Hyperlipidemia:  (Status: Goal on Track (progressing): YES.) Lab Results  Component Value Date   CHOL 227 (H) 11/03/2019   HDL 65 11/03/2019   LDLCALC 136 (H) 11/03/2019   LDLDIRECT 158.1 10/02/2013   TRIG 134 11/03/2019   CHOLHDL 3.5 11/03/2019  10-24-2021: The patient will have new lab work in May for evaluation of cholesterol levels   Medication review performed; medication list updated in electronic medical record. 10-24-2021: The patient takes Omega 3 fatty acids  Provider established cholesterol goals reviewed; Counseled on importance of regular laboratory monitoring as prescribed. 10-24-2021: The patient reminded of regular lab work for testing.  Provided HLD educational materials; Reviewed role and benefits of statin for ASCVD risk reduction; Discussed strategies to manage statin-induced myalgias; Reviewed importance of limiting foods high in cholesterol. 10-24-2021: Review of heart healthy diet   Hypertension: (Status: Goal on Track (progressing): YES.) Last practice recorded BP readings:  BP Readings from Last 3 Encounters:  09/21/21 (!) 156/62  09/02/21 (!) 158/70  05/18/21 (!) 139/47  Self reported blood pressure on 08-21-2021: 152/67 with a pulse of 69 Most recent eGFR/CrCl: No results found for: EGFR  No components found for: CRCL  Evaluation of current treatment plan related to hypertension self management and patient's adherence to plan as established by provider. 08-22-2021: The patient takes blood pressures at home with the assistance of her husband on a regular basis. The patient states it runs good at home. Usually systolic of 710'G.   10-24-2021: The  patient states that they are not taking her blood pressures at home as it is "not working out". She does go to the hospital periodically to have her blood pressures taken and it is "up and down". The patient states that sometimes she gets a headache when her blood pressure is elevated. She denies any headaches or discomfort today. Education and support given.   Provided education to patient re: stroke prevention, s/s of heart attack and stroke; Reviewed prescribed diet heart healthy. 10-24-2021: Review of heart healthy diet Reviewed medications with patient and discussed importance of compliance. 10-24-2021: Is compliant with medications;  Discussed plans with patient for ongoing care management follow up and provided patient with direct contact information for care management team; Advised patient, providing education and rationale, to monitor blood pressure daily and record, calling PCP for findings outside established parameters;  Provided education on prescribed diet heart healthy;  Discussed complications of poorly controlled blood pressure such as heart disease, stroke, circulatory complications, vision complications, kidney impairment, sexual dysfunction;    Dementia   (Status: Goal on Track (progressing): YES.) Long Term Goal  Evaluation of current treatment plan related to Dementia, Mental Health Concerns  self-management and patient's adherence to plan as established by provider. 08-22-2021:  The patient is stable with her memory at this time. She was happy to see her family at Christmas and enjoyed the grandkids. 10-24-2021: The patient is doing well today. Enjoying the pretty weather. The patient states that she is going to the day care and enjoys it. Her husband was not available to talk to today. The patient feels she is doing well at this time. Denies any acute findings today.  Discussed plans with patient for ongoing care management follow up and provided patient with direct contact information  for care management team Advised patient to call the office for changes in conditions, changes in mood, questions or concerns; Reviewed medications with patient and discussed compliance. The patient is taking Aricept 10 mg at bedtime and has seen an improvement in her sleep habits. 10-24-2021: The patient states she does not feel that she sleeps the best. She goes to bed early and gets up about 6 am every morning. Encouraged the patient to talk to the pcp about her sleeping habits at her next visit. ; Discussed plans with patient for ongoing care management follow up and provided patient with direct contact information for care management team; Advised patient to discuss changes in dementia and new concerns  with provider; Screening for signs and symptoms of depression related to chronic disease state;  Assessed social determinant of health barriers;     Pain:  (Status: Goal on Track (progressing): YES.) Long Term Goal  Pain assessment performed. 08-22-2021: The patient rates her pain level at a 10 on a scale of 0-10. Review of pain level and the patient at first said "40". Unclear if the patient understands fully but the patient states it is back pain and it is managed at this time. 10-24-2021: Denies any pain or discomfort today.  Medications reviewed. 08-22-2021: The patient uses Tylenol Arthritis for pain relief, also uses a gel she gets from the chiropractor that she puts in the freezer that works well for her.  Reviewed provider established plan for pain management; Discussed importance of adherence to all scheduled medical appointments; Counseled on the importance of reporting any/all new or changed pain symptoms or management strategies to pain management provider; Advised patient to report to care team affect of pain on daily activities; Discussed use of relaxation techniques and/or diversional activities to assist with pain reduction (distraction, imagery, relaxation, massage, acupressure, TENS,  heat, and cold application. 08-22-2021: The patient states that she uses a heating pad at times and this is helpful for her pain relief.  Reviewed with patient prescribed pharmacological and nonpharmacological pain relief strategies; Advised patient to discuss unresolved pain, changes in level or intensity of pain with provider; Screening for signs and symptoms of depression related to chronic disease state;  Assessed social determinant of health barriers;    Patient Goals/Self-Care Activities: Patient will self administer medications as prescribed as evidenced by self report/primary caregiver report  Patient will attend all scheduled provider appointments as evidenced by clinician review of documented attendance to scheduled appointments and patient/caregiver report Patient will call pharmacy for medication refills as evidenced by patient report and review of pharmacy fill history as appropriate Patient will attend church or other social activities as evidenced by patient report Patient will continue to perform ADL's independently as evidenced by patient/caregiver report Patient will continue to perform IADL's independently as evidenced by patient/caregiver report Patient will call provider office for new concerns or questions as evidenced by review of documented incoming telephone call notes and patient report Patient will work  with BSW to address care coordination needs and will continue to work with the clinical team to address health care and disease management related needs as evidenced by documented adherence to scheduled care management/care coordination appointments - check blood pressure 3 times per week - choose a place to take my blood pressure (home, clinic or office, retail store) - write blood pressure results in a log or diary - learn about high blood pressure - keep a blood pressure log - take blood pressure log to all doctor appointments - call doctor for signs and symptoms of  high blood pressure - develop an action plan for high blood pressure - keep all doctor appointments - take medications for blood pressure exactly as prescribed - report new symptoms to your doctor - eat more whole grains, fruits and vegetables, lean meats and healthy fats - call for medicine refill 2 or 3 days before it runs out - take all medications exactly as prescribed - call doctor with any symptoms you believe are related to your medicine - call doctor when you experience any new symptoms - go to all doctor appointments as scheduled - adhere to prescribed diet: heart healthy       Plan:Telephone follow up appointment with care management team member scheduled for:  01-16-2022 at 1 pm  [place], MSN, [place]: [phone]

## 2021-10-31 DIAGNOSIS — M539 Dorsopathy, unspecified: Secondary | ICD-10-CM | POA: Diagnosis not present

## 2021-10-31 DIAGNOSIS — M159 Polyosteoarthritis, unspecified: Secondary | ICD-10-CM | POA: Diagnosis not present

## 2021-11-10 ENCOUNTER — Encounter: Payer: Self-pay | Admitting: Podiatry

## 2021-11-10 ENCOUNTER — Ambulatory Visit: Payer: PPO | Admitting: Podiatry

## 2021-11-10 DIAGNOSIS — B351 Tinea unguium: Secondary | ICD-10-CM | POA: Diagnosis not present

## 2021-11-10 DIAGNOSIS — M205X9 Other deformities of toe(s) (acquired), unspecified foot: Secondary | ICD-10-CM

## 2021-11-10 DIAGNOSIS — M79675 Pain in left toe(s): Secondary | ICD-10-CM | POA: Diagnosis not present

## 2021-11-10 DIAGNOSIS — M2041 Other hammer toe(s) (acquired), right foot: Secondary | ICD-10-CM

## 2021-11-10 DIAGNOSIS — M79674 Pain in right toe(s): Secondary | ICD-10-CM

## 2021-11-10 NOTE — Progress Notes (Signed)
This patient returns to my office for at risk foot care.  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  This patient is unable to cut nails herself since the patient cannot reach her nails.These nails are painful walking and wearing shoes. This patient presents for at risk foot care today. ? ?General Appearance  Alert, conversant and in no acute stress. ? ?Vascular  Dorsalis pedis and posterior tibial  pulses are palpable  bilaterally.  Capillary return is within normal limits  bilaterally. Temperature is within normal limits  bilaterally. ? ?Neurologic  Senn-Weinstein monofilament wire test within normal limits  bilaterally. Muscle power within normal limits bilaterally. ? ?Nails Thick disfigured discolored nails with subungual debris  from hallux to fifth toes bilaterally. No evidence of bacterial infection or drainage bilaterally. ? ?Orthopedic  No limitations of motion  feet .  No crepitus or effusions noted.  No bony pathology or digital deformities noted. 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  Hammer toes second  B/L.  Palpable pain over 2,3,4 MPJ left and 2,3,4 metatatsal shafts left foot.  Asymptomatic fourth toe which also is ecchymotic. ? ?Skin  normotropic skin with no porokeratosis noted bilaterally.  No signs of infections or ulcers noted.   Ecchymosis at dorsal base digits 2-4 left foot.  Ecchymosis at bunion right foot. ? ?Onychomycosis  Pain in right toes  Pain in left toes ? ?Consent was obtained for treatment procedures.   Mechanical debridement of nails 1-5  bilaterally performed with a nail nipper.   ? ?Return office visit   3 months                   Told patient to return for periodic foot care and evaluation due to potential at risk complications. ? ? ?Gardiner Barefoot DPM  ?

## 2021-11-11 DIAGNOSIS — I10 Essential (primary) hypertension: Secondary | ICD-10-CM

## 2021-11-11 DIAGNOSIS — E782 Mixed hyperlipidemia: Secondary | ICD-10-CM

## 2021-11-11 DIAGNOSIS — M159 Polyosteoarthritis, unspecified: Secondary | ICD-10-CM

## 2021-12-02 ENCOUNTER — Ambulatory Visit (INDEPENDENT_AMBULATORY_CARE_PROVIDER_SITE_OTHER): Payer: PPO

## 2021-12-02 DIAGNOSIS — Q666 Other congenital valgus deformities of feet: Secondary | ICD-10-CM

## 2021-12-02 DIAGNOSIS — M205X9 Other deformities of toe(s) (acquired), unspecified foot: Secondary | ICD-10-CM | POA: Diagnosis not present

## 2021-12-02 DIAGNOSIS — M2042 Other hammer toe(s) (acquired), left foot: Secondary | ICD-10-CM

## 2021-12-02 DIAGNOSIS — M7752 Other enthesopathy of left foot: Secondary | ICD-10-CM

## 2021-12-02 DIAGNOSIS — M2041 Other hammer toe(s) (acquired), right foot: Secondary | ICD-10-CM | POA: Diagnosis not present

## 2021-12-02 NOTE — Progress Notes (Signed)
SITUATION: ?Reason for Visit: Fitting and Delivery of Custom Fabricated Foot Orthoses ?Patient Report: Patient reports comfort and is satisfied with device. ? ?OBJECTIVE DATA: ?Patient History / Diagnosis:   ?  ICD-10-CM   ?1. Hallux limitus, unspecified laterality  M20.5X9   ?  ?2. Hammer toes of both feet  M20.41   ? M20.42   ?  ?3. Capsulitis of metatarsophalangeal (MTP) joint of left foot  M77.52   ?  ?4. 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  ? ? ?Provided Device:  Custom Functional Foot Orthotics ?    [facility]: [id number] ? ?GOAL OF ORTHOSIS ?- Improve gait ?- Decrease energy expenditure ?- Improve Balance ?- Provide Triplanar stability of foot complex ?- Facilitate motion ? ?ACTIONS PERFORMED ?Patient was fit with foot orthotics trimmed to shoe last. Patient tolerated fittign procedure.  ? ?Patient was provided with verbal and written instruction and demonstration regarding donning, doffing, wear, care, proper fit, function, purpose, cleaning, and use of the orthosis and in all related precautions and risks and benefits regarding the orthosis. ? ?Patient was also provided with verbal instruction regarding how to report any failures or malfunctions of the orthosis and necessary follow up care. Patient was also instructed to contact our office regarding any change in status that may affect the function of the orthosis. ? ?Patient demonstrated independence with proper donning, doffing, and fit and verbalized understanding of all instructions. ? ?PLAN: ?Patient is to follow up in one week or as necessary (PRN). All questions were answered and concerns addressed. Plan of care was discussed with and agreed upon by the patient. ? ?

## 2021-12-05 DIAGNOSIS — R413 Other amnesia: Secondary | ICD-10-CM | POA: Diagnosis not present

## 2021-12-05 DIAGNOSIS — M545 Low back pain, unspecified: Secondary | ICD-10-CM | POA: Diagnosis not present

## 2021-12-05 DIAGNOSIS — R2681 Unsteadiness on feet: Secondary | ICD-10-CM | POA: Diagnosis not present

## 2021-12-05 DIAGNOSIS — R262 Difficulty in walking, not elsewhere classified: Secondary | ICD-10-CM | POA: Diagnosis not present

## 2021-12-05 DIAGNOSIS — G8929 Other chronic pain: Secondary | ICD-10-CM | POA: Diagnosis not present

## 2021-12-12 ENCOUNTER — Encounter: Payer: PPO | Admitting: Internal Medicine

## 2022-01-03 ENCOUNTER — Ambulatory Visit: Payer: PPO

## 2022-01-06 ENCOUNTER — Encounter: Payer: Self-pay | Admitting: Internal Medicine

## 2022-01-06 ENCOUNTER — Ambulatory Visit (INDEPENDENT_AMBULATORY_CARE_PROVIDER_SITE_OTHER): Payer: PPO | Admitting: Internal Medicine

## 2022-01-06 ENCOUNTER — Ambulatory Visit: Payer: PPO

## 2022-01-06 VITALS — BP 138/76 | HR 73 | Temp 97.1°F | Wt 140.0 lb

## 2022-01-06 DIAGNOSIS — M8589 Other specified disorders of bone density and structure, multiple sites: Secondary | ICD-10-CM

## 2022-01-06 DIAGNOSIS — M8580 Other specified disorders of bone density and structure, unspecified site: Secondary | ICD-10-CM | POA: Insufficient documentation

## 2022-01-06 DIAGNOSIS — Z1231 Encounter for screening mammogram for malignant neoplasm of breast: Secondary | ICD-10-CM | POA: Diagnosis not present

## 2022-01-06 DIAGNOSIS — E785 Hyperlipidemia, unspecified: Secondary | ICD-10-CM | POA: Diagnosis not present

## 2022-01-06 DIAGNOSIS — Z0001 Encounter for general adult medical examination with abnormal findings: Secondary | ICD-10-CM

## 2022-01-06 NOTE — Progress Notes (Signed)
Subjective:    Patient ID: Kristin [name], female    DOB: 1941/01/10, 81 y.o.   MRN: [id number]  HPI  Patient presents to clinic today for annual exam.  Flu: 05/2021 Tetanus: 08/2002 COVID: Pfizer x3 Pneumovax: 02/2017 Prevnar: 10/2013 Shingrix: 08/2005, 05/2008 Pap smear: Hysterectomy Mammogram: 05/2021 Bone density: 03/2018 Colon screening: 03/2010 Vision screening: annually Dentist: biannually  Diet: She does not eat a lot of meat. She consumes fruits and veggies. She tries to avoid fried foods. She drinks mostly water, boost, tea and coffee. 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 ([place]) 08/27/2015   Cough    no fever    Degeneration of lumbar or lumbosacral intervertebral disc    Dizziness    r/t meds   DVT (deep venous thrombosis) ([facility])    Fibromyalgia    History of shingles    [name] (irritable bowel syndrome)    takes OTC Hardin Negus colon health   Joint pain    Joint swelling    Myalgia and myositis, unspecified    Obstructive sleep apnea (adult) (pediatric)    Old myocardial infarction 1996   Osteoarthritis    Osteoarthrosis, unspecified whether generalized or localized, unspecified site    Psoriatic arthritis ([place])    Rheumatoid arthritis ([place])    Rosacea    Seasonal allergies    Stroke [facility])    ischaemic microvascular disease   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[place])    1991 , bleed and dizziness    Unspecified essential hypertension    takes Diltiazem,Metoprolol,and Losartan daily   UTI (lower urinary tract infection)    frequent=last one was winter of 2014    Current Outpatient Medications  Medication Sig Dispense Refill    acetaminophen (TYLENOL) 325 MG tablet Take 650 mg by mouth every 8 (eight) hours as needed (pain).     ASPIRIN 81 PO Take 1 tablet by mouth daily.     betamethasone dipropionate (DIPROLENE) 0.05 % ointment APPLY TO AFFECTED AREAS TWICE DAILY AS NEEDED (Patient not taking: Reported on 09/26/2021)     Calcium Carbonate-Vitamin D (CALTRATE 600+D PO) Take 1 tablet by mouth daily at 12 noon.     Cranberry 400 MG TABS Take 1 tablet by mouth daily.     diltiazem (CARDIZEM CD) 240 MG 24 hr capsule Take 1 capsule (240 mg total) by mouth daily. 90 capsule 3   donepezil (ARICEPT) 10 MG tablet Take 10 mg by mouth at bedtime.     DULoxetine (CYMBALTA) 20 MG capsule Take 1 capsule by mouth 2 (two) times daily.     gabapentin (NEURONTIN) 300 MG capsule Take 300 mg by mouth 2 (two) times daily.     Lactobacillus Rhamnosus, GG, (CULTURELLE PO) Take 1 capsule by mouth daily.     meclizine (ANTIVERT) 12.5 MG tablet TAKE 1 TABLET BY MOUTH THREE TIMES DAILYAS NEEDED FOR DIZZINESS 270 tablet 1   memantine (NAMENDA) 10 MG tablet TAKE 1 TABLET BY MOUTH TWICE DAILY 180 tablet 0   Multiple Vitamin (MULTIVITAMIN) tablet Take 1 tablet by mouth daily.     Omega-3 Fatty Acids (FISH OIL) 1200  MG CAPS Take 1 capsule by mouth daily.      Polyethyl Glycol-Propyl Glycol 0.4-0.3 % SOLN Apply 1 drop to eye daily as needed (dry eyes).     Red Yeast Rice 600 MG CAPS Take 2 capsules by mouth daily.     No current facility-administered medications for this visit.    Allergies  Allergen Reactions   Angiotensin Receptor Blockers Anaphylaxis    Fatigue & Dizziness   Other Anaphylaxis    Other reaction(s): Other (See Comments) Dizziness, rash Fatigue & Dizziness   Hydralazine Other (See Comments)    Unknown   Latex     REACTION: Rash, blisters, breathing probs.   Losartan     Dizziness    Methotrexate Derivatives     Dizziness, rash    Spironolactone     Dizziness    Penicillins Hives and Rash    Family History   Problem Relation Age of Onset   Colon cancer Father    Heart attack Mother    Coronary artery disease Mother    Osteoporosis Mother    Heart disease Mother    Heart failure Mother    GER disease Sister    Skin cancer Sister    Hypertension Sister    Osteopenia Sister    GER disease Sister    Breast cancer Paternal Aunt        age 16   Heart failure Maternal Grandmother    Heart failure Maternal Grandfather     Social History   Socioeconomic History   Marital status: Married    Spouse name: Kristin [name]   Number of children: 2   Years of education: BSN   Highest education level: Not on file  Occupational History   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 She is DNR, [name] is husband Kristin [name] , then son Kristin [name]       10/22/12-cardiac work up found the reason for her dizziness,  she needs no neurological follow up for that.  her enthusiasm for CPAP remains limited and is explained by her resiudal high AHI after she finally reached compliance ,[place] 13 points.     95% pressure is 11.7 cm , AHI  20 .7- she does not benefit from CPAP . She is allowed to d/c the machine and we need to concerntrate on her dizziness and facial right droop. ordered MRI brain repeat.    Eye exam next week. PT at [facility]. MRI brain in April- per patients request order next month.          Social Determinants of Health   Financial  Resource Strain: Low Risk    Difficulty of Paying Living Expenses: Not hard at all  Food Insecurity: No Food Insecurity   Worried About Running Out of  Food in the Last Year: Never true   Helper in the Last Year: Never true  Transportation Needs: No Transportation Needs   Lack of Transportation (Medical): No   Lack of Transportation (Non-Medical): No  Physical Activity: Insufficiently Active   Days of Exercise per Week: 3 days   Minutes of Exercise per Session: 20 min  Stress: No Stress Concern Present   Feeling of Stress : Not at all  Social Connections: Moderately Isolated   Frequency of Communication with Friends and Family: More than three times a week   Frequency of Social Gatherings with Friends and Family: More than three times a week   Attends Religious Services: Never   Marine scientist or Organizations: No   Attends Music therapist: Never   Marital Status: Married  Human resources officer Violence: Not At Risk   Fear of Current or Ex-Partner: No   Emotionally Abused: No   Physically Abused: No   Sexually Abused: No     Constitutional: Denies fever, malaise, fatigue, headache or abrupt weight changes.  HEENT: Denies eye pain, eye redness, ear pain, ringing in the ears, wax buildup, runny nose, nasal congestion, bloody nose, or sore throat. Respiratory: Denies difficulty breathing, shortness of breath, cough or sputum production.   Cardiovascular: Denies chest pain, chest tightness, palpitations or swelling in the hands or feet.  Gastrointestinal: Denies abdominal pain, bloating, constipation, diarrhea or blood in the stool.  GU: Denies urgency, frequency, pain with urination, burning sensation, blood in urine, odor or discharge. Musculoskeletal: Patient reports intermittent joint and muscle pain.  Denies decrease in range of motion, difficulty with gait, or joint swelling.  Skin: Denies redness, rashes, lesions or ulcercations.  Neurological: Patient  reports difficulty with memory.  Denies dizziness, difficulty with speech or problems with balance and coordination.  Psych: Denies anxiety, depression, SI/HI.  No other specific complaints in a complete review of systems (except as listed in HPI above).     Objective:   Physical Exam  BP (!) 146/78 (BP Location: Left Arm, Patient Position: Sitting, Cuff Size: Large)   Pulse 73   Temp (!) 97.1 F (36.2 C) (Temporal)   Wt 140 lb (63.5 kg)   LMP 08/14/1989   SpO2 99%   BMI 21.93 kg/m   Wt Readings from Last 3 Encounters:  09/21/21 128 lb (58.1 kg)  09/02/21 129 lb 4 oz (58.6 kg)  05/18/21 122 lb 12.8 oz (55.7 kg)    General: Appears her stated age, chronically ill appearing in NAD. Skin: Warm, dry and intact.  HEENT: Head: normal shape and size; Eyes: sclera white, no icterus, conjunctiva pink, PERRLA and EOMs intact;  Neck:  Neck supple, trachea midline. No masses, lumps or thyromegaly present.  Cardiovascular: Normal rate and rhythm. S1,S2 noted.  No murmur, rubs or gallops noted. Trace BLE edema. No carotid bruits noted. Pulmonary/Chest: Normal effort and positive vesicular breath sounds. No respiratory distress. No wheezes, rales or ronchi noted.  Abdomen: Soft and nontender. Normal bowel sounds.  Musculoskeletal: Gait slow and steady without device. Neurological: Alert . Trouble with recall.  Coordination normal.  Psychiatric: Mood and affect normal. Behavior is normal. Judgment and thought content normal.    BMET    Component Value Date/Time   NA 139 01/14/2021 0829   NA 136 12/29/2016 1036   K 3.9 01/14/2021 0829   CL 102 01/14/2021 0829   CO2 27 01/14/2021 0829   GLUCOSE 112 (H) 01/14/2021 0829   BUN  18 01/14/2021 0829   BUN 58 (H) 12/29/2016 1036   CREATININE 1.20 (H) 01/14/2021 0829   CREATININE 1.21 (H) 11/03/2019 0941   CALCIUM 9.3 01/14/2021 0829   GFRNONAA 46 (L) 01/14/2021 0829   GFRNONAA 43 (L) 11/03/2019 0941   GFRAA 49 (L) 11/03/2019 0941     Lipid Panel     Component Value Date/Time   CHOL 227 (H) 11/03/2019 0941   TRIG 134 11/03/2019 0941   HDL 65 11/03/2019 0941   CHOLHDL 3.5 11/03/2019 0941   VLDL 21.2 02/21/2018 1035   LDLCALC 136 (H) 11/03/2019 0941    CBC    Component Value Date/Time   WBC 7.4 11/03/2019 0941   RBC 3.83 11/03/2019 0941   HGB 12.1 11/03/2019 0941   HGB 12.1 03/22/2009 1331   HCT 36.8 11/03/2019 0941   HCT 36.1 03/22/2009 1331   PLT 258 11/03/2019 0941   PLT 257 03/22/2009 1331   MCV 96.1 11/03/2019 0941   MCV 97.5 03/22/2009 1331   MCH 31.6 11/03/2019 0941   MCHC 32.9 11/03/2019 0941   RDW 13.7 11/03/2019 0941   RDW 15.9 (H) 03/22/2009 1331   LYMPHSABS 1,177 11/03/2019 0941   LYMPHSABS 1.6 03/22/2009 1331   MONOABS 0.7 06/14/2018 1320   MONOABS 0.6 03/22/2009 1331   EOSABS 370 11/03/2019 0941   EOSABS 0.1 03/22/2009 1331   BASOSABS 81 11/03/2019 0941   BASOSABS 0.1 03/22/2009 1331    Hgb A1C No results found for: HGBA1C          Assessment & Plan:   Preventative Health Maintenance:  Encouraged her to get a flu shot in the fall She declines tetanus for financial reasons, advised her if she gets bit or cut to have this done at the pharmacy Pneumovax and Prevnar UTD Shingrix UTD Encouraged her to get her COVID booster She no longer needs Pap smears Mammogram and bone density ordered-she will call to schedule She no longer needs colon cancer screening Encouraged her to consume a balanced diet and exercise regimen Advised her to see an eye doctor and dentist annually We will check CBC, c-Met, lipid profile today  RTC in 6 months, follow-up chronic conditions Webb Silversmith, NP

## 2022-01-06 NOTE — Patient Instructions (Signed)
Health Maintenance for Postmenopausal Women Menopause is a normal process in which your ability to get pregnant comes to an end. This process happens slowly over many months or years, usually between the ages of 48 and 55. Menopause is complete when you have missed your menstrual period for 12 months. It is important to talk with your health care provider about some of the most common conditions that affect women after menopause (postmenopausal women). These include heart disease, cancer, and bone loss (osteoporosis). Adopting a healthy lifestyle and getting preventive care can help to promote your health and wellness. The actions you take can also lower your chances of developing some of these common conditions. What are the signs and symptoms of menopause? During menopause, you may have the following symptoms: Hot flashes. These can be moderate or severe. Night sweats. Decrease in sex drive. Mood swings. Headaches. Tiredness (fatigue). Irritability. Memory problems. Problems falling asleep or staying asleep. Talk with your health care provider about treatment options for your symptoms. Do I need hormone replacement therapy? Hormone replacement therapy is effective in treating symptoms that are caused by menopause, such as hot flashes and night sweats. Hormone replacement carries certain risks, especially as you become older. If you are thinking about using estrogen or estrogen with progestin, discuss the benefits and risks with your health care provider. How can I reduce my risk for heart disease and stroke? The risk of heart disease, heart attack, and stroke increases as you age. One of the causes may be a change in the body's hormones during menopause. This can affect how your body uses dietary fats, triglycerides, and cholesterol. Heart attack and stroke are medical emergencies. There are many things that you can do to help prevent heart disease and stroke. Watch your blood pressure High  blood pressure causes heart disease and increases the risk of stroke. This is more likely to develop in people who have high blood pressure readings or are overweight. Have your blood pressure checked: Every 3-5 years if you are 18-39 years of age. Every year if you are 40 years old or older. Eat a healthy diet  Eat a diet that includes plenty of vegetables, fruits, low-fat dairy products, and lean protein. Do not eat a lot of foods that are high in solid fats, added sugars, or sodium. Get regular exercise Get regular exercise. This is one of the most important things you can do for your health. Most adults should: Try to exercise for at least 150 minutes each week. The exercise should increase your heart rate and make you sweat (moderate-intensity exercise). Try to do strengthening exercises at least twice each week. Do these in addition to the moderate-intensity exercise. Spend less time sitting. Even light physical activity can be beneficial. Other tips Work with your health care provider to achieve or maintain a healthy weight. Do not use any products that contain nicotine or tobacco. These products include cigarettes, chewing tobacco, and vaping devices, such as e-cigarettes. If you need help quitting, ask your health care provider. Know your numbers. Ask your health care provider to check your cholesterol and your blood sugar (glucose). Continue to have your blood tested as directed by your health care provider. Do I need screening for cancer? Depending on your health history and family history, you may need to have cancer screenings at different stages of your life. This may include screening for: Breast cancer. Cervical cancer. Lung cancer. Colorectal cancer. What is my risk for osteoporosis? After menopause, you may be   at increased risk for osteoporosis. Osteoporosis is a condition in which bone destruction happens more quickly than new bone creation. To help prevent osteoporosis or  the bone fractures that can happen because of osteoporosis, you may take the following actions: If you are 19-50 years old, get at least 1,000 mg of calcium and at least 600 international units (IU) of vitamin D per day. If you are older than age 50 but younger than age 70, get at least 1,200 mg of calcium and at least 600 international units (IU) of vitamin D per day. If you are older than age 70, get at least 1,200 mg of calcium and at least 800 international units (IU) of vitamin D per day. Smoking and drinking excessive alcohol increase the risk of osteoporosis. Eat foods that are rich in calcium and vitamin D, and do weight-bearing exercises several times each week as directed by your health care provider. How does menopause affect my mental health? Depression may occur at any age, but it is more common as you become older. Common symptoms of depression include: Feeling depressed. Changes in sleep patterns. Changes in appetite or eating patterns. Feeling an overall lack of motivation or enjoyment of activities that you previously enjoyed. Frequent crying spells. Talk with your health care provider if you think that you are experiencing any of these symptoms. General instructions See your health care provider for regular wellness exams and vaccines. This may include: Scheduling regular health, dental, and eye exams. Getting and maintaining your vaccines. These include: Influenza vaccine. Get this vaccine each year before the flu season begins. Pneumonia vaccine. Shingles vaccine. Tetanus, diphtheria, and pertussis (Tdap) booster vaccine. Your health care provider may also recommend other immunizations. Tell your health care provider if you have ever been abused or do not feel safe at home. Summary Menopause is a normal process in which your ability to get pregnant comes to an end. This condition causes hot flashes, night sweats, decreased interest in sex, mood swings, headaches, or lack  of sleep. Treatment for this condition may include hormone replacement therapy. Take actions to keep yourself healthy, including exercising regularly, eating a healthy diet, watching your weight, and checking your blood pressure and blood sugar levels. Get screened for cancer and depression. Make sure that you are up to date with all your vaccines. This information is not intended to replace advice given to you by your health care provider. Make sure you discuss any questions you have with your health care provider. Document Revised: 12/20/2020 Document Reviewed: 12/20/2020 Elsevier Patient Education  2023 [employer]  

## 2022-01-07 LAB — COMPLETE METABOLIC PANEL WITH GFR
AG Ratio: 1.4 (calc) (ref 1.0–2.5)
ALT: 16 U/L (ref 6–29)
AST: 20 U/L (ref 10–35)
Albumin: 3.9 g/dL (ref 3.6–5.1)
Alkaline phosphatase (APISO): 60 U/L (ref 37–153)
BUN/Creatinine Ratio: 29 (calc) — ABNORMAL HIGH (ref 6–22)
BUN: 35 mg/dL — ABNORMAL HIGH (ref 7–25)
CO2: 30 mmol/L (ref 20–32)
Calcium: 9.7 mg/dL (ref 8.6–10.4)
Chloride: 104 mmol/L (ref 98–110)
Creat: 1.22 mg/dL — ABNORMAL HIGH (ref 0.60–0.95)
Globulin: 2.8 g/dL (calc) (ref 1.9–3.7)
Glucose, Bld: 88 mg/dL (ref 65–139)
Potassium: 4.1 mmol/L (ref 3.5–5.3)
Sodium: 142 mmol/L (ref 135–146)
Total Bilirubin: 0.3 mg/dL (ref 0.2–1.2)
Total Protein: 6.7 g/dL (ref 6.1–8.1)
eGFR: 45 mL/min/{1.73_m2} — ABNORMAL LOW (ref >=60)

## 2022-01-07 LAB — LIPID PANEL
Cholesterol: 189 mg/dL (ref <200)
HDL: 77 mg/dL (ref >=50)
LDL Cholesterol (Calc): 87 mg/dL (calc)
Non-HDL Cholesterol (Calc): 112 mg/dL (calc) (ref <130)
Total CHOL/HDL Ratio: 2.5 (calc) (ref <5.0)
Triglycerides: 150 mg/dL — ABNORMAL HIGH (ref <150)

## 2022-01-07 LAB — CBC
HCT: 34.7 % — ABNORMAL LOW (ref 35.0–45.0)
Hemoglobin: 11.8 g/dL (ref 11.7–15.5)
MCH: 32.3 pg (ref 27.0–33.0)
MCHC: 34 g/dL (ref 32.0–36.0)
MCV: 95.1 fL (ref 80.0–100.0)
MPV: 9.6 fL (ref 7.5–12.5)
Platelets: 258 10*3/uL (ref 140–400)
RBC: 3.65 10*6/uL — ABNORMAL LOW (ref 3.80–5.10)
RDW: 14 % (ref 11.0–15.0)
WBC: 9.7 10*3/uL (ref 3.8–10.8)

## 2022-01-11 ENCOUNTER — Ambulatory Visit (INDEPENDENT_AMBULATORY_CARE_PROVIDER_SITE_OTHER): Payer: PPO

## 2022-01-11 DIAGNOSIS — Z0000 Encounter for general adult medical examination without abnormal findings: Secondary | ICD-10-CM

## 2022-01-11 NOTE — Progress Notes (Signed)
Subjective:    I connected with  Kristin [name] on 01/11/22 by a audio enabled telemedicine application and verified that I am speaking with the correct person using two identifiers.  Patient Location: Home  Provider Location: Home Office  I discussed the limitations of evaluation and management by telemedicine. The patient expressed understanding and agreed to proceed.    Kristin [name] is a 81 y.o. female who presents for Medicare Annual (Subsequent) preventive examination.  Review of Systems    Per HPI unless specifically indicated below      Objective:    Today's Vitals   01/11/22 1647  PainSc: 2    There is no height or weight on file to calculate BMI.     12/28/2020    8:23 AM 11/04/2019   10:04 AM 06/14/2018    1:26 PM 02/21/2018   10:18 AM 08/21/2017    1:07 PM 06/18/2017    1:29 PM 02/19/2017   10:18 AM  Advanced Directives  Does Patient Have a Medical Advance Directive? Yes Yes Yes Yes Yes Yes Yes  Type of Paramedic of [place];Living will Living will;Healthcare Power of [place];Living will [place];Living will Living will [place];Living will [place];Living will  Copy of [place] in Chart? No - copy requested No - copy requested No - copy requested No - copy requested   No - copy requested    Current Medications (verified) Outpatient Encounter Medications as of 01/11/2022  Medication Sig   acetaminophen (TYLENOL) 325 MG tablet Take 650 mg by mouth every 8 (eight) hours as needed (pain).   ASPIRIN 81 PO Take 1 tablet by mouth daily.   betamethasone dipropionate (DIPROLENE) 0.05 % ointment    Calcium Carbonate-Vitamin D (CALTRATE 600+D PO) Take 1 tablet by mouth daily at 12 noon.   Cranberry 400 MG TABS Take 1 tablet by mouth daily.   diltiazem (CARDIZEM CD) 240 MG 24 hr capsule Take 1 capsule (240 mg total) by mouth daily.    DULoxetine (CYMBALTA) 20 MG capsule Take 1 capsule by mouth 2 (two) times daily.   Lactobacillus Rhamnosus, GG, (CULTURELLE PO) Take 1 capsule by mouth daily.   meclizine (ANTIVERT) 12.5 MG tablet TAKE 1 TABLET BY MOUTH THREE TIMES DAILYAS NEEDED FOR DIZZINESS   memantine (NAMENDA) 10 MG tablet TAKE 1 TABLET BY MOUTH TWICE DAILY   Multiple Vitamin (MULTIVITAMIN) tablet Take 1 tablet by mouth daily.   Omega-3 Fatty Acids (FISH OIL) 1200 MG CAPS Take 1 capsule by mouth daily.    Polyethyl Glycol-Propyl Glycol 0.4-0.3 % SOLN Apply 1 drop to eye daily as needed (dry eyes).   Red Yeast Rice 600 MG CAPS Take 2 capsules by mouth daily.   donepezil (ARICEPT) 10 MG tablet Take 10 mg by mouth at bedtime.   gabapentin (NEURONTIN) 300 MG capsule Take 300 mg by mouth 2 (two) times daily.   [DISCONTINUED] gabapentin (NEURONTIN) 100 MG capsule Take 200 mg by mouth 2 (two) times daily.   No facility-administered encounter medications on file as of 01/11/2022.    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 ([place]) 08/27/2015   Cough    no fever    Degeneration of lumbar or lumbosacral intervertebral disc    Dizziness    r/t meds   DVT (deep venous thrombosis) (HCC)    Fibromyalgia    History of shingles    [name] (irritable bowel syndrome)    takes OTC [place] Negus colon health   Joint pain    Joint swelling    Myalgia and myositis, unspecified    Obstructive sleep apnea (adult) (pediatric)    Old myocardial infarction 1996   Osteoarthritis    Osteoarthrosis, unspecified whether  generalized or localized, unspecified site    Psoriatic arthritis ([place])    Rheumatoid arthritis ([place])    Rosacea    Seasonal allergies    Stroke ([place])    ischaemic microvascular disease   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 Mild distal LAD and proximal RCA disease.   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 ;  Surgeon: Izora Gala, MD;  Location: [facility];  Service: ENT;  Laterality: N/A;   NASAL SINUS SURGERY  1998   OOPHORECTOMY     BSO   sinus biopsy  1998   inverted [place]   for mennorhagia-LAVH BSO   Family History  Problem Relation Age of Onset   Colon cancer Father    Heart attack Mother    Coronary artery disease Mother    Osteoporosis Mother    Heart disease Mother    Heart failure Mother    GER disease Sister    Skin cancer Sister    Hypertension Sister    Osteopenia Sister    GER disease Sister    Breast cancer Paternal Aunt        age 57   Heart failure Maternal Grandmother    Heart failure Maternal Grandfather    Social History   Socioeconomic History   Marital status: Married    Spouse name: Agricultural consultant   Number of children: 2   Years of education: BSN   Highest education level: Not on file  Occupational History   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 She is DNR, [name] is husband [name] , then son [name]       10/22/12-cardiac work up found the reason for her dizziness,  she needs no neurological follow up for that.  her enthusiasm for CPAP remains limited and is explained by her resiudal high AHI after she finally reached compliance ,[place] 13 points.     95% pressure is 11.7 cm , AHI  20 .7- she does not benefit from CPAP . She is allowed to d/c the machine and we need to concerntrate on her dizziness and facial right droop. ordered MRI brain repeat.    Eye exam next week. PT at [facility]. MRI brain in April- per patients request order next month.          Social Determinants of Health   Financial Resource Strain: Low Risk    Difficulty of Paying Living Expenses: Not hard at all  Food Insecurity: No Food Insecurity   Worried About Charity fundraiser in the Last Year: Never true   [place] in the Last Year: Never true  Transportation Needs: No Transportation Needs   Lack of Transportation (Medical): No   Lack of Transportation (Non-Medical): No  Physical Activity: Insufficiently Active   Days of Exercise per Week: 3 days   Minutes of Exercise per Session: 20 min  Stress: No Stress Concern Present   Feeling of Stress : Not at all  Social Connections: Moderately Isolated   Frequency of Communication with Friends and Family: More than three times  a week   Frequency of Social Gatherings with Friends and Family: More than three times a week   Attends Religious Services: Never   Marine scientist or Organizations: No   Attends Music therapist: Never   Marital Status: Married    Tobacco Counseling Counseling given: Not Answered   Clinical Intake:     Pain : 0-10 Pain Score: 2  Pain Type: Chronic pain Pain Location: Knee Pain Orientation: Right, Left Pain Descriptors / Indicators: Aching Pain Onset: 1 to 4 weeks ago Pain Frequency: Intermittent     Nutritional Status: BMI 25 -29 Overweight Nutritional Risks: None Diabetes: No    Diabetic?No      Activities of Daily Living    01/11/2022    4:37 PM 09/21/2021   10:50 AM  In your present state of health, do you have any difficulty performing the following activities:  Hearing? 0 0  Vision? 1 0  Difficulty concentrating or making decisions? 1 0  Walking or climbing stairs? 1 1  Dressing or bathing? 0 0  Doing errands, shopping? 0 1    Patient Care Team: [name], NP as PCP - General (Internal Medicine) Wellington Hampshire, MD as PCP - Cardiology (Cardiology) Wellington Hampshire, MD as Consulting Physician (Cardiology) Birder Robson, MD as Referring Physician (Ophthalmology) Oneta Rack, MD as Consulting Physician (Dermatology) Valinda Party, MD as Consulting Physician (Rheumatology) Vicenta Aly, [place] (Chiropractic Medicine) Curley Spice Virl Diamond, [place] as Pharmacist Hall Busing Nobie Putnam, RN as Case Manager (General Practice) [place], [facility] Michaelle Copas  Neurology   Indicate any recent Medical Services you may have received from other than [facility] providers in the past year (date may be approximate).  No hospitalization in the past 12 months    Assessment:   This is a routine wellness examination for [place].  Hearing/Vision screen No results found.  Dietary issues and exercise activities  discussed: Current Exercise Habits: The patient does not participate in regular exercise at present, Exercise limited by: neurologic condition(s)   Goals Addressed   None    Depression Screen    01/11/2022    4:37 PM 09/21/2021   10:50 AM 06/13/2021    2:28 PM 12/28/2020    8:24 AM 12/18/2019   11:45 AM 11/04/2019   10:03 AM 03/04/2019   10:01 AM  PHQ 2/9 Scores  PHQ - 2 Score 0 0 0 0 0 0 0  PHQ- 9 Score  0         Fall Risk    01/11/2022    4:36 PM 09/21/2021   10:49 AM 01/03/2021    8:29 AM 12/28/2020    8:24 AM 12/29/2019    2:34 PM  [place] in the past year? 1 1 0 0 0  Number falls in past yr: 1 0 0    Injury with Fall? 1 1 0    Risk for fall due to : Impaired balance/gait History of fall(s) No Fall Risks Medication side effect   Follow up Falls evaluation completed Falls evaluation completed Falls evaluation completed Falls evaluation completed;Education provided;Falls prevention discussed     FALL RISK PREVENTION PERTAINING TO THE HOME:  Any stairs in or around the home? Yes  If so, are there any without handrails? Yes  Home free of loose throw rugs in walkways, pet beds, electrical cords, etc? Yes  Adequate lighting in your home to reduce risk of falls? Yes   ASSISTIVE DEVICES UTILIZED TO PREVENT FALLS:  Life alert? No  Use of a cane, walker or w/c? No  Grab bars in the bathroom? Yes  Shower chair or bench in shower? No  Elevated toilet seat or a handicapped toilet? No   Cognitive Function:    12/29/2019    2:34 PM 07/02/2018    9:26 AM 02/21/2018   10:25 AM 02/19/2017   10:22 AM 01/28/2016    9:40 AM  MMSE - Mini Mental State Exam  Orientation to time 0 '2 5 3 5  '$ Orientation to time comments    Does not know date or day of the week.   Orientation to Place '4 5 5 5 5  '$ Registration '3 3 3 3 3  '$ Attention/ Calculation 0 0 0 0 0  Recall 0 0 0 0 0  Recall-comments   unable to recall 3 of 3 words  pt was unable to recall 3 of 3 words  Language- name 2 objects  2 2 0 0 0  Language- repeat '1 1 1 1 1  '$ Language- follow 3 step command '2 3 3 3 3  '$ Language- read & follow direction 1 1 0 0 0  Write a sentence 1 1 0 0 0  Copy design 0 1 0 0 0  Total score '14 19 17 15 17        '$ 01/11/2022    4:40 PM 12/28/2020    8:26 AM 06/03/2019    1:46 PM  6CIT Screen  What Year? 4 points 0 points 0 points  What month? 3 points 0 points  3 points  What time? 3 points 0 points 3 points  Count back from 20 2 points 0 points 2 points  Months in reverse 4 points 4 points 4 points  Repeat phrase 10 points 10 points 10 points  Total Score 26 points 14 points 22 points    Immunizations Immunization History  Administered Date(s) Administered   Fluad Quad(high Dose 65+) 06/03/2019, 06/01/2020, 05/18/2021   Influenza Split 05/16/2012   Influenza Whole 05/15/2007, 05/20/2008, 05/14/2009, 05/14/2010   Influenza, High Dose Seasonal PF 05/11/2018   Influenza,inj,Quad PF,6+ Mos 05/13/2016, 06/12/2017   Influenza-Unspecified 05/14/2013, 06/06/2014, 06/14/2020   PFIZER Comirnaty(Gray Top)Covid-19 Tri-Sucrose Vaccine 09/10/2019, 10/01/2019, 05/17/2020   PFIZER(Purple Top)SARS-COV-2 Vaccination 09/10/2019, 10/01/2019, 05/17/2020   Pneumococcal Conjugate-13 10/17/2013   Pneumococcal Polysaccharide-23 08/14/2005, 02/19/2017   Td 08/14/2002   Zoster, Live 08/14/2005, 05/18/2008    TDAP status: Up to date  Flu Vaccine status: Up to date  Pneumococcal vaccine status: Up to date  Covid-19 vaccine status: Completed vaccines  Qualifies for Shingles Vaccine? Yes   Zostavax completed No   Shingrix Completed?: No.    Education has been provided regarding the importance of this vaccine. Patient has been advised to call insurance company to determine out of pocket expense if they have not yet received this vaccine. Advised may also receive vaccine at local pharmacy or Health Dept. Verbalized acceptance and understanding.  Screening Tests Health Maintenance  Topic Date Due    Zoster Vaccines- Shingrix (1 of 2) Never done   COVID-19 Vaccine (6 - Booster) 01/22/2022 (Originally 07/12/2020)   INFLUENZA VACCINE  03/14/2022   TETANUS/TDAP  05/28/2025   Pneumonia Vaccine 25+ Years old  Completed   DEXA SCAN  Completed   HPV VACCINES  Aged Out    Health Maintenance  Health Maintenance Due  Topic Date Due   Zoster Vaccines- Shingrix (1 of 2) Never done    Colon Cancer Screening: No longer Required   Mammogram: No longer required   Lung Cancer Screening: (Low Dose CT Chest recommended if Age 64-80 years, 30 pack-year currently smoking OR have quit w/in 15years.) does not qualify.   Lung Cancer Screening Referral: does not qualify   Additional Screening:  Hepatitis C Screening: does qualify; Completed   Vision Screening: Recommended annual ophthalmology exams for early detection of glaucoma and other disorders of the eye. Is the patient up to date with their annual eye exam?  No  Who is the provider or what is the name of the office in which the patient attends annual eye exams? Birder Robson, MD  If pt is not established with a provider, would they like to be referred to a provider to establish care? No .   Dental Screening: Recommended annual dental exams for proper oral hygiene  Community Resource Referral / Chronic Care Management: CRR required this visit?  No   CCM required this visit?  No      Plan:     I have personally reviewed and noted the following in the patient's chart:   Medical and social history Use of alcohol, tobacco or illicit drugs  Current medications and supplements including opioid prescriptions.  Functional ability and status Nutritional status Physical activity Advanced directives List of other physicians Hospitalizations, surgeries, and ER visits in previous 12 months Vitals Screenings to include cognitive, depression, and falls Referrals and appointments  In addition, I have reviewed and discussed with  patient certain preventive protocols, quality metrics, and best practice recommendations. A written personalized care plan for  preventive services as well as general preventive health recommendations were provided to patient.    Ms. Romito , Thank you for taking time to come for your Medicare Wellness Visit. I appreciate your ongoing commitment to your health goals. Please review the following plan we discussed and let me know if I can assist you in the future.   These are the goals we discussed:  Goals      Patient Stated     12/28/2020, no goals     Pharmacy goals     Please check blood pressure at home, keep log, and bring to medical appointments  Feel free to call me with any questions or concerns. I look forward to our next call!  Wallace Cullens, PharmD, Para [place], CPP Clinical Pharmacist [facility] [phone]         This is a list of the screening recommended for you and due dates:  Health Maintenance  Topic Date Due   Zoster (Shingles) Vaccine (1 of 2) Never done   COVID-19 Vaccine (6 - Booster) 01/22/2022*   Flu Shot  03/14/2022   Tetanus Vaccine  05/28/2025   Pneumonia Vaccine  Completed   DEXA scan (bone density measurement)  Completed   HPV Vaccine  Aged Out  *Topic was postponed. The date shown is not the original due date.      Wilson Singer, [place]   01/11/2022

## 2022-01-11 NOTE — Patient Instructions (Signed)

## 2022-01-16 ENCOUNTER — Telehealth: Payer: PPO

## 2022-01-16 ENCOUNTER — Ambulatory Visit (INDEPENDENT_AMBULATORY_CARE_PROVIDER_SITE_OTHER): Payer: PPO

## 2022-01-16 DIAGNOSIS — F01A0 Vascular dementia, mild, without behavioral disturbance, psychotic disturbance, mood disturbance, and anxiety: Secondary | ICD-10-CM

## 2022-01-16 DIAGNOSIS — M159 Polyosteoarthritis, unspecified: Secondary | ICD-10-CM

## 2022-01-16 DIAGNOSIS — Z9181 History of falling: Secondary | ICD-10-CM

## 2022-01-16 DIAGNOSIS — I10 Essential (primary) hypertension: Secondary | ICD-10-CM

## 2022-01-16 DIAGNOSIS — E782 Mixed hyperlipidemia: Secondary | ICD-10-CM

## 2022-01-16 DIAGNOSIS — R413 Other amnesia: Secondary | ICD-10-CM

## 2022-01-16 NOTE — Chronic Care Management (AMB) (Signed)
Chronic Care Management   CCM RN Visit Note  01/16/2022 Name: Kristin [name] MRN: [id number] DOB: March 22, 1941  Subjective: Kristin [name] is a 81 y.o. year old female who is a primary care patient of [name], NP. The care management team was consulted for assistance with disease management and care coordination needs.    Engaged with patient by telephone for follow up visit in response to provider referral for case management and/or care coordination services. Spoke to the patient and the patients husband, Kristin [name] who is also DRP.  Consent to Services:  The patient was given information about Chronic Care Management services, agreed to services, and gave verbal consent prior to initiation of services.  Please see initial visit note for detailed documentation.   Patient agreed to services and verbal consent obtained.   Assessment: Review of patient past medical history, allergies, medications, health status, including review of consultants reports, laboratory and other test data, was performed as part of comprehensive evaluation and provision of chronic care management services.   SDOH (Social Determinants of Health) assessments and interventions performed:    CCM Care Plan  Allergies  Allergen Reactions   Angiotensin Receptor Blockers Anaphylaxis    Fatigue & Dizziness   Other Anaphylaxis    Other reaction(s): Other (See Comments) Dizziness, rash Fatigue & Dizziness   Hydralazine Other (See Comments)    Unknown   Latex     REACTION: Rash, blisters, breathing probs.   Losartan     Dizziness    Methotrexate Derivatives     Dizziness, rash    Spironolactone     Dizziness    Penicillins Hives and Rash    Outpatient Encounter Medications as of 01/16/2022  Medication Sig   acetaminophen (TYLENOL) 325 MG tablet Take 650 mg by mouth every 8 (eight) hours as needed (pain).   ASPIRIN 81 PO Take 1 tablet by mouth daily.   betamethasone dipropionate (DIPROLENE) 0.05 %  ointment    Calcium Carbonate-Vitamin D (CALTRATE 600+D PO) Take 1 tablet by mouth daily at 12 noon.   Cranberry 400 MG TABS Take 1 tablet by mouth daily.   diltiazem (CARDIZEM CD) 240 MG 24 hr capsule Take 1 capsule (240 mg total) by mouth daily.   donepezil (ARICEPT) 10 MG tablet Take 10 mg by mouth at bedtime.   DULoxetine (CYMBALTA) 20 MG capsule Take 1 capsule by mouth 2 (two) times daily.   gabapentin (NEURONTIN) 300 MG capsule Take 300 mg by mouth 2 (two) times daily.   Lactobacillus Rhamnosus, GG, (CULTURELLE PO) Take 1 capsule by mouth daily.   meclizine (ANTIVERT) 12.5 MG tablet TAKE 1 TABLET BY MOUTH THREE TIMES DAILYAS NEEDED FOR DIZZINESS   memantine (NAMENDA) 10 MG tablet TAKE 1 TABLET BY MOUTH TWICE DAILY   Multiple Vitamin (MULTIVITAMIN) tablet Take 1 tablet by mouth daily.   Omega-3 Fatty Acids (FISH OIL) 1200 MG CAPS Take 1 capsule by mouth daily.    Polyethyl Glycol-Propyl Glycol 0.4-0.3 % SOLN Apply 1 drop to eye daily as needed (dry eyes).   Red Yeast Rice 600 MG CAPS Take 2 capsules by mouth daily.   [DISCONTINUED] gabapentin (NEURONTIN) 100 MG capsule Take 200 mg by mouth 2 (two) times daily.   No facility-administered encounter medications on file as of 01/16/2022.    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 ([place]) 08/27/2015   Osteoarthritis 08/10/2011   MYOCARDIAL INFARCTION, HX OF 08/01/2010   Fibromyalgia 05/20/2008   Hyperlipidemia 05/29/2007   OBSTRUCTIVE SLEEP APNEA 05/29/2007   Essential hypertension, benign 05/29/2007    Conditions to be addressed/monitored:HTN, HLD, Dementia, and Chronic pain and falls  Care Plan : RNCM: General Plan  of Care (Adult) for Chronic Disease Management and Care Coordination Needs  Updates made by Kristin Ingles, RN since 01/16/2022 12:00 AM     Problem: RNCM: Development of Plan of Care for Chronic Disease Management( HTN, HLD, Dementia,  Chronic pain, Falls)   Priority: High     Long-Range Goal: RNCM: Effective Management of Plan of Care for Chronic Disease Management( HTN, HLD, Dementia, chronic pain Falls)   Start Date: 06/13/2021  Expected End Date: 06/13/2022  Priority: High  Note:   Current Barriers:  Knowledge Deficits related to plan of care for management of HTN, HLD, Dementia, Chronic Pain,   and Falls Prevention and Safety  Chronic Disease Management support and education needs related to HTN, HLD, and Dementia and falls prevention   RNCM Clinical Goal(s):  Patient will verbalize understanding of plan for management of HTN, HLD, Chronic pain,  Dementia, and Falls prevention and safety as evidenced by regular follow up with pcp, working with the CCM team on regular outreaches, calling the office for changes in conditions or questions, stabilized conditions take all medications exactly as prescribed and will call provider for medication related questions as evidenced by compliance with medications, calling for refills before running out of medications, calling pharm D for questions or concerns    demonstrate a decrease in falls and injury exacerbations  as evidenced by no new fall or injuries related to falls and safety hazards in the patients surroundings demonstrate ongoing self health care management ability for effective management of chronic conditions as evidenced by  working with the CCM team   through collaboration with Consulting civil engineer, provider, and care team.   Interventions: 1:1 collaboration with primary care provider regarding development and update of comprehensive plan of care as evidenced by provider attestation and co-signature Inter-disciplinary care team collaboration  (see longitudinal plan of care) Evaluation of current treatment plan related to  self management and patient's adherence to plan as established by provider   SDOH Barriers (Status: Goal on Track (progressing): YES.) Long Term Goal  Patient interviewed and SDOH assessment performed        SDOH Interventions    Flowsheet Row Most Recent Value  SDOH Interventions   Food Insecurity Interventions Intervention Not Indicated  Financial Strain Interventions Intervention Not Indicated  Housing Interventions Intervention Not Indicated  Intimate Partner Violence Interventions Intervention Not Indicated  Physical Activity Interventions Other (Comments)  [does her crafts, no structured activity, likes going outside when she can]  Stress Interventions Intervention Not Indicated  Social Connections Interventions Other (Comment)  [good support system]  Transportation Interventions Intervention Not Indicated     Patient interviewed and appropriate assessments performed Provided patient with information about resources available for changes in SDOH and care guides to help with local resources in [facility]  Discussed plans with patient for ongoing care management follow up and provided patient with direct contact information for care management team Advised patient to call the office for changes in [place], questions or concerns    Falls:  (Status:  Goal on Track (progressing): YES.) Long Term Goal  Provided written and verbal education re: potential causes of falls and Fall prevention strategies Reviewed medications and discussed potential side effects of medications such as dizziness and frequent urination. 01-16-2022: The patient denies dizziness or issues with medications.  Advised patient of importance of notifying provider of falls. 10-24-2021: Review and education provided. The patient denies any new falls. 01-16-2022: The patient denies any new falls but the patients husband states that she has  fallen x 2 since the last outreach from the [facility].  The husband denies any injuries but states that she has had some decline.  Assessed for signs and symptoms of orthostatic hypotension Assessed for falls since last encounter. 10-24-2021: no new falls reported. The patient states that she is being safe and denies any falls. The patient is mindful of safety measures. 01-16-2022:: The patient has had falls since the last outreach x 2. The patient nor husband deny any injuries. Education and support given.  Assessed patients knowledge of fall risk prevention secondary to previously provided education. 01-16-2022: The patient saw neurology recently and the patient has had a decline in memory and change in mood at times. She is sleeping more. Denies any issues with eating. Review of being safe in her surroundings and monitoring for potential situations that put her at increase risk of falling.  Screening for signs and symptoms of depression related to chronic disease state Assessed social determinant of health barriers  Hyperlipidemia:  (Status: Goal on Track (progressing): YES.) Lab Results  Component Value Date   CHOL 189 01/06/2022   HDL 77 01/06/2022   LDLCALC 87 01/06/2022   LDLDIRECT 158.1 10/02/2013   TRIG 150 (H) 01/06/2022   CHOLHDL 2.5 01/06/2022  10-24-2021: The patient will have new lab work in May for evaluation of cholesterol levels   Medication review performed; medication list updated in electronic medical record. 01-16-2022: The patient takes Omega 3 fatty acids  Provider established cholesterol goals reviewed. 01-16-2022: Review of goals of cholesterol levels. Counseled on importance of regular laboratory monitoring as prescribed. 01-16-2022: The patient reminded of regular lab work for testing.  Provided HLD educational materials; Reviewed role and benefits of statin for ASCVD risk reduction; Discussed strategies to manage statin-induced myalgias; Reviewed importance of limiting foods high  in cholesterol. 01-16-2022: Review of heart healthy diet. The patient and the patients husband state that the patient continues to eat well.   Hypertension: (Status: Goal on Track (progressing): YES.) Last practice recorded BP readings:  BP Readings from Last 3 Encounters:  01/06/22 138/76  09/21/21 (!) 156/62  09/02/21 (!) 158/70  Self reported blood pressure on 08-21-2021: 152/67 with a pulse of 69 Most recent eGFR/CrCl:  Lab Results  Component Value Date   EGFR 45 (L) 01/06/2022    No components found for: CRCL  Evaluation of current treatment plan related to hypertension self management and patient's adherence to plan as established by provider. 08-22-2021: The patient takes blood pressures at home with the assistance of her husband on a regular basis. The patient states it runs good at home. Usually systolic of 528'U.   10-24-2021: The patient states that they are not taking her blood pressures at home as it is "not working out". She does go to the hospital periodically to have her blood pressures taken and it is "up and down". The patient states that sometimes she gets a headache when her blood pressure is elevated. She denies any headaches or discomfort today. Education and  support given.  01-16-2022: The patient has more stable blood pressure readings. Denies any acute changes in blood pressures. At neurology in April it was 134/77. Provided education to patient re: stroke prevention, s/s of heart attack and stroke; Reviewed prescribed diet heart healthy. 01-16-2022: Review of heart healthy diet. Patient is eating well Reviewed medications with patient and discussed importance of compliance. 01-16-2022: Is compliant with medications. The patients husband fixes her medications and makes sure she takes as directed. ;  Discussed plans with patient for ongoing care management follow up and provided patient with direct contact information for care management team; Advised patient, providing education and  rationale, to monitor blood pressure daily and record, calling PCP for findings outside established parameters;  Provided education on prescribed diet heart healthy;  Discussed complications of poorly controlled blood pressure such as heart disease, stroke, circulatory complications, vision complications, kidney impairment, sexual dysfunction;    Dementia   (Status: Goal on Track (progressing): YES.) Long Term Goal  Evaluation of current treatment plan related to Dementia, Mental Health Concerns  self-management and patient's adherence to plan as established by provider. 08-22-2021: The patient is stable with her memory at this time. She was happy to see her family at Christmas and enjoyed the grandkids. 10-24-2021: The patient is doing well today. Enjoying the pretty weather. The patient states that she is going to the day care and enjoys it. Her husband was not available to talk to today. The patient feels she is doing well at this time. Denies any acute findings today. 01-16-2022: The patient feels she is about the same, but per the husband her memory, especially short term is worse. She is attending adult day care and she feels this helps her. The patients husband says she is sleeping a lot more and can see changes in her mood. He denies any acute findings but she has also had a couple of falls since the last outreach with the [facility]. Education and support given.  Discussed plans with patient for ongoing care management follow up and provided patient with direct contact information for care management team Advised patient to call the office for changes in conditions, changes in mood, questions or concerns; Reviewed medications with patient and discussed compliance. The patient is taking Aricept 10 mg at bedtime and has seen an improvement in her sleep habits. 10-24-2021: The patient states she does not feel that she sleeps the best. She goes to bed early and gets up about 6 am every morning. Encouraged the  patient to talk to the pcp about her sleeping habits at her next visit. 01-16-2022: The patient has had changes in her medication. Per her husband she is compliant with medications; Discussed plans with patient for ongoing care management follow up and provided patient with direct contact information for care management team; Advised patient to discuss changes in dementia and new concerns  with provider; Screening for signs and symptoms of depression related to chronic disease state;  Assessed social determinant of health barriers;     Pain:  (Status: Goal on Track (progressing): YES.) Long Term Goal  Pain assessment performed. 08-22-2021: The patient rates her pain [name] at a 10 on a scale of 0-10. Review of pain [name] and the patient at first said "40". Unclear if the patient understands fully but the patient states it is back pain and it is managed at this time. 10-24-2021: Denies any pain or discomfort today. 01-16-2022: The patient is having pain in her back. She states that  it is there all the time. She rates her pain [name] today at a 4 on a scale of 0-10. Education and support given.  Medications reviewed. 08-22-2021: The patient uses Tylenol Arthritis for pain relief, also uses a gel she gets from the chiropractor that she puts in the freezer that works well for her. 01-16-2022: Takes medications as prescribed. The patient had changes in gabapentin dose recently and this helps with the back pain and discomfort.  Reviewed provider established plan for pain management; Discussed importance of adherence to all scheduled medical appointments; Counseled on the importance of reporting any/all new or changed pain symptoms or management strategies to pain management provider; Advised patient to report to care team affect of pain on daily activities; Discussed use of relaxation techniques and/or diversional activities to assist with pain reduction (distraction, imagery, relaxation, massage, acupressure, TENS,  heat, and cold application. 08-22-2021: The patient states that she uses a heating pad at times and this is helpful for her pain relief.  Reviewed with patient prescribed pharmacological and nonpharmacological pain relief strategies; Advised patient to discuss unresolved pain, changes in [name] or intensity of pain with provider; Screening for signs and symptoms of depression related to chronic disease state;  Assessed social determinant of health barriers;    Patient Goals/Self-Care Activities: Patient will self administer medications as prescribed as evidenced by self report/primary caregiver report  Patient will attend all scheduled provider appointments as evidenced by clinician review of documented attendance to scheduled appointments and patient/caregiver report Patient will call pharmacy for medication refills as evidenced by patient report and review of pharmacy fill history as appropriate Patient will attend church or other social activities as evidenced by patient report Patient will continue to perform ADL's independently as evidenced by patient/caregiver report Patient will continue to perform IADL's independently as evidenced by patient/caregiver report Patient will call provider office for new concerns or questions as evidenced by review of documented incoming telephone call notes and patient report Patient will work with BSW to address care coordination needs and will continue to work with the clinical team to address health care and disease management related needs as evidenced by documented adherence to scheduled care management/care coordination appointments - check blood pressure 3 times per week - choose a place to take my blood pressure (home, clinic or office, retail store) - write blood pressure results in a log or diary - learn about high blood pressure - keep a blood pressure log - take blood pressure log to all doctor appointments - call doctor for signs and symptoms of  high blood pressure - develop an action plan for high blood pressure - keep all doctor appointments - take medications for blood pressure exactly as prescribed - report new symptoms to your doctor - eat more whole grains, fruits and vegetables, lean meats and healthy fats - call for medicine refill 2 or 3 days before it runs out - take all medications exactly as prescribed - call doctor with any symptoms you believe are related to your medicine - call doctor when you experience any new symptoms - go to all doctor appointments as scheduled - adhere to prescribed diet: heart healthy       Plan:Telephone follow up appointment with care management team member scheduled for:  04-24-2022 at 1 pm  [place], MSN, [place] Mobile: [phone]

## 2022-01-16 NOTE — Patient Instructions (Signed)
Visit Information  Thank you for taking time to visit with me today. Please don't hesitate to contact me if I can be of assistance to you before our next scheduled telephone appointment.  Following are the goals we discussed today:  Falls:  (Status: Goal on Track (progressing): YES.) Long Term Goal  Provided written and verbal education re: potential causes of falls and Fall prevention strategies Reviewed medications and discussed potential side effects of medications such as dizziness and frequent urination. 01-16-2022: The patient denies dizziness or issues with medications.  Advised patient of importance of notifying provider of falls. 10-24-2021: Review and education provided. The patient denies any new falls. 01-16-2022: The patient denies any new falls but the patients husband states that she has fallen x 2 since the last outreach from the [facility].  The husband denies any injuries but states that she has had some decline.  Assessed for signs and symptoms of orthostatic hypotension Assessed for falls since last encounter. 10-24-2021: no new falls reported. The patient states that she is being safe and denies any falls. The patient is mindful of safety measures. 01-16-2022:: The patient has had falls since the last outreach x 2. The patient nor husband deny any injuries. Education and support given.  Assessed patients knowledge of fall risk prevention secondary to previously provided education. 01-16-2022: The patient saw neurology recently and the patient has had a decline in memory and change in mood at times. She is sleeping more. Denies any issues with eating. Review of being safe in her surroundings and monitoring for potential situations that put her at increase risk of falling.  Screening for signs and symptoms of depression related to chronic disease state Assessed social determinant of health barriers   Hyperlipidemia:  (Status: Goal on Track (progressing): YES.)      Lab Results  Component Value  Date    CHOL 189 01/06/2022    HDL 77 01/06/2022    LDLCALC 87 01/06/2022    LDLDIRECT 158.1 10/02/2013    TRIG 150 (H) 01/06/2022    CHOLHDL 2.5 01/06/2022  10-24-2021: The patient will have new lab work in May for evaluation of cholesterol levels    Medication review performed; medication list updated in electronic medical record. 01-16-2022: The patient takes Omega 3 fatty acids  Provider established cholesterol goals reviewed. 01-16-2022: Review of goals of cholesterol levels. Counseled on importance of regular laboratory monitoring as prescribed. 01-16-2022: The patient reminded of regular lab work for testing.  Provided HLD educational materials; Reviewed role and benefits of statin for ASCVD risk reduction; Discussed strategies to manage statin-induced myalgias; Reviewed importance of limiting foods high in cholesterol. 01-16-2022: Review of heart healthy diet. The patient and the patients husband state that the patient continues to eat well.    Hypertension: (Status: Goal on Track (progressing): YES.) Last practice recorded BP readings:     BP Readings from Last 3 Encounters:  01/06/22 138/76  09/21/21 (!) 156/62  09/02/21 (!) 158/70  Self reported blood pressure on 08-21-2021: 152/67 with a pulse of 69 Most recent eGFR/CrCl:       Lab Results  Component Value Date    EGFR 45 (L) 01/06/2022    No components found for: CRCL   Evaluation of current treatment plan related to hypertension self management and patient's adherence to plan as established by provider. 08-22-2021: The patient takes blood pressures at home with the assistance of her husband on a regular basis. The patient states it runs good at home. Usually  systolic of 353'I.   10-24-2021: The patient states that they are not taking her blood pressures at home as it is "not working out". She does go to the hospital periodically to have her blood pressures taken and it is "up and down". The patient states that sometimes she gets a  headache when her blood pressure is elevated. She denies any headaches or discomfort today. Education and support given.  01-16-2022: The patient has more stable blood pressure readings. Denies any acute changes in blood pressures. At neurology in April it was 134/77. Provided education to patient re: stroke prevention, s/s of heart attack and stroke; Reviewed prescribed diet heart healthy. 01-16-2022: Review of heart healthy diet. Patient is eating well Reviewed medications with patient and discussed importance of compliance. 01-16-2022: Is compliant with medications. The patients husband fixes her medications and makes sure she takes as directed. ;  Discussed plans with patient for ongoing care management follow up and provided patient with direct contact information for care management team; Advised patient, providing education and rationale, to monitor blood pressure daily and record, calling PCP for findings outside established parameters;  Provided education on prescribed diet heart healthy;  Discussed complications of poorly controlled blood pressure such as heart disease, stroke, circulatory complications, vision complications, kidney impairment, sexual dysfunction;      Dementia   (Status: Goal on Track (progressing): YES.) Long Term Goal  Evaluation of current treatment plan related to Dementia, Mental Health Concerns  self-management and patient's adherence to plan as established by provider. 08-22-2021: The patient is stable with her memory at this time. She was happy to see her family at Christmas and enjoyed the grandkids. 10-24-2021: The patient is doing well today. Enjoying the pretty weather. The patient states that she is going to the day care and enjoys it. Her husband was not available to talk to today. The patient feels she is doing well at this time. Denies any acute findings today. 01-16-2022: The patient feels she is about the same, but per the husband her memory, especially short term is  worse. She is attending adult day care and she feels this helps her. The patients husband says she is sleeping a lot more and can see changes in her mood. He denies any acute findings but she has also had a couple of falls since the last outreach with the [facility]. Education and support given.  Discussed plans with patient for ongoing care management follow up and provided patient with direct contact information for care management team Advised patient to call the office for changes in conditions, changes in mood, questions or concerns; Reviewed medications with patient and discussed compliance. The patient is taking Aricept 10 mg at bedtime and has seen an improvement in her sleep habits. 10-24-2021: The patient states she does not feel that she sleeps the best. She goes to bed early and gets up about 6 am every morning. Encouraged the patient to talk to the pcp about her sleeping habits at her next visit. 01-16-2022: The patient has had changes in her medication. Per her husband she is compliant with medications; Discussed plans with patient for ongoing care management follow up and provided patient with direct contact information for care management team; Advised patient to discuss changes in dementia and new concerns  with provider; Screening for signs and symptoms of depression related to chronic disease state;  Assessed social determinant of health barriers;       Pain:  (Status: Goal on Track (progressing): YES.) Long  Term Goal  Pain assessment performed. 08-22-2021: The patient rates her pain level at a 10 on a scale of 0-10. Review of pain level and the patient at first said "40". Unclear if the patient understands fully but the patient states it is back pain and it is managed at this time. 10-24-2021: Denies any pain or discomfort today. 01-16-2022: The patient is having pain in her back. She states that it is there all the time. She rates her pain level today at a 4 on a scale of 0-10. Education and  support given.  Medications reviewed. 08-22-2021: The patient uses Tylenol Arthritis for pain relief, also uses a gel she gets from the chiropractor that she puts in the freezer that works well for her. 01-16-2022: Takes medications as prescribed. The patient had changes in gabapentin dose recently and this helps with the back pain and discomfort.  Reviewed provider established plan for pain management; Discussed importance of adherence to all scheduled medical appointments; Counseled on the importance of reporting any/all new or changed pain symptoms or management strategies to pain management provider; Advised patient to report to care team affect of pain on daily activities; Discussed use of relaxation techniques and/or diversional activities to assist with pain reduction (distraction, imagery, relaxation, massage, acupressure, TENS, heat, and cold application. 08-22-2021: The patient states that she uses a heating pad at times and this is helpful for her pain relief.  Reviewed with patient prescribed pharmacological and nonpharmacological pain relief strategies; Advised patient to discuss unresolved pain, changes in level or intensity of pain with provider; Screening for signs and symptoms of depression related to chronic disease state;  Assessed social determinant of health barriers;    Our next appointment is by telephone on 04-24-2022 at 1 pm  Please call the care guide team at [phone] if you need to cancel or reschedule your appointment.   If you are experiencing a Mental Health or [place] or need someone to talk to, please call the Suicide and Crisis Lifeline: 988 call the [place] National Suicide Prevention Lifeline: 4502856186 or TTY: 9183044081 TTY 8181010543) to talk to a trained counselor call 1-800-273-TALK (toll free, 24 hour hotline)   Patient verbalizes understanding of instructions and care plan provided today and agrees to view in [place]. Active MyChart  status and patient understanding of how to access instructions and care plan via MyChart confirmed with patient.     Noreene Larsson RN, MSN, [place] Mobile: [phone]

## 2022-02-06 ENCOUNTER — Ambulatory Visit: Payer: PPO | Admitting: Pharmacist

## 2022-02-06 DIAGNOSIS — I10 Essential (primary) hypertension: Secondary | ICD-10-CM

## 2022-02-10 DIAGNOSIS — I10 Essential (primary) hypertension: Secondary | ICD-10-CM

## 2022-02-10 DIAGNOSIS — E785 Hyperlipidemia, unspecified: Secondary | ICD-10-CM

## 2022-02-10 DIAGNOSIS — F0390 Unspecified dementia without behavioral disturbance: Secondary | ICD-10-CM

## 2022-02-27 DIAGNOSIS — Z961 Presence of intraocular lens: Secondary | ICD-10-CM | POA: Diagnosis not present

## 2022-02-27 DIAGNOSIS — M3501 Sicca syndrome with keratoconjunctivitis: Secondary | ICD-10-CM | POA: Diagnosis not present

## 2022-02-27 DIAGNOSIS — H43813 Vitreous degeneration, bilateral: Secondary | ICD-10-CM | POA: Diagnosis not present

## 2022-03-13 ENCOUNTER — Ambulatory Visit: Payer: PPO | Admitting: Podiatry

## 2022-03-13 ENCOUNTER — Encounter: Payer: Self-pay | Admitting: Podiatry

## 2022-03-13 DIAGNOSIS — M2041 Other hammer toe(s) (acquired), right foot: Secondary | ICD-10-CM

## 2022-03-13 DIAGNOSIS — M2042 Other hammer toe(s) (acquired), left foot: Secondary | ICD-10-CM

## 2022-03-13 DIAGNOSIS — Q666 Other congenital valgus deformities of feet: Secondary | ICD-10-CM

## 2022-03-13 DIAGNOSIS — B351 Tinea unguium: Secondary | ICD-10-CM

## 2022-03-13 DIAGNOSIS — M205X9 Other deformities of toe(s) (acquired), unspecified foot: Secondary | ICD-10-CM

## 2022-03-13 DIAGNOSIS — M79609 Pain in unspecified limb: Secondary | ICD-10-CM | POA: Diagnosis not present

## 2022-03-13 NOTE — Progress Notes (Signed)
This patient returns to my office for at risk foot care.  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  This patient is unable to cut nails herself since the patient cannot reach her nails.These nails are painful walking and wearing shoes. This patient presents for at risk foot care today.  General Appearance  Alert, conversant and in no acute stress.  Vascular  Dorsalis pedis and posterior tibial  pulses are palpable  bilaterally.  Capillary return is within normal limits  bilaterally. Temperature is within normal limits  bilaterally.  Neurologic  Senn-Weinstein monofilament wire test within normal limits  bilaterally. Muscle power within normal limits bilaterally.  Nails Thick disfigured discolored nails with subungual debris  from hallux to fifth toes bilaterally. No evidence of bacterial infection or drainage bilaterally.  Orthopedic  No limitations of motion  feet .  No crepitus or effusions noted.  No bony pathology or digital deformities noted. 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  Hammer toes second  B/L.    Skin  normotropic skin with no porokeratosis noted bilaterally.  No signs of infections or ulcers noted.   Onychomycosis  Pain in right toes  Pain in left toes  Consent was obtained for treatment procedures.   Mechanical debridement of nails 1-5  bilaterally performed with a nail nipper.    Return office visit   4 months                   Told patient to return for periodic foot care and evaluation due to potential at risk complications.   Lorinda Copland DPM  

## 2022-04-24 ENCOUNTER — Telehealth: Payer: Self-pay

## 2022-04-24 ENCOUNTER — Telehealth: Payer: PPO

## 2022-04-24 NOTE — Patient Outreach (Signed)
  Care Coordination   04/24/2022 Name: MAREN WIESEN MRN: [id number] DOB: October 20, 1940   Care Coordination Outreach Attempts:  An unsuccessful telephone outreach was attempted for a scheduled appointment today.  Follow Up Plan:  No further outreach attempts will be made at this time. We have been unable to contact the patient to offer or enroll patient in care coordination services  Encounter Outcome:  No Answer  Care Coordination Interventions Activated:  No   Care Coordination Interventions:  No, not indicated    Noreene Larsson RN, MSN, [place] Coordinator [place] [facility] Mobile: [phone]

## 2022-05-22 ENCOUNTER — Ambulatory Visit
Admission: RE | Admit: 2022-05-22 | Discharge: 2022-05-22 | Disposition: A | Discharge status: Home / Self Care | Payer: PPO | Source: Ambulatory Visit | Attending: Internal Medicine | Admitting: Internal Medicine

## 2022-05-22 DIAGNOSIS — M85852 Other specified disorders of bone density and structure, left thigh: Secondary | ICD-10-CM | POA: Diagnosis not present

## 2022-05-22 DIAGNOSIS — Z1231 Encounter for screening mammogram for malignant neoplasm of breast: Secondary | ICD-10-CM | POA: Insufficient documentation

## 2022-05-22 DIAGNOSIS — M8589 Other specified disorders of bone density and structure, multiple sites: Secondary | ICD-10-CM | POA: Diagnosis not present

## 2022-06-02 DIAGNOSIS — M539 Dorsopathy, unspecified: Secondary | ICD-10-CM | POA: Diagnosis not present

## 2022-06-02 DIAGNOSIS — M159 Polyosteoarthritis, unspecified: Secondary | ICD-10-CM | POA: Diagnosis not present

## 2022-06-05 ENCOUNTER — Ambulatory Visit: Payer: PPO | Admitting: Pharmacist

## 2022-06-05 DIAGNOSIS — I10 Essential (primary) hypertension: Secondary | ICD-10-CM

## 2022-06-05 NOTE — Patient Instructions (Signed)
Goals Addressed             This Visit's Progress    Pharmacy goals       Please check blood pressure at home, keep log, and bring to medical appointments  Feel free to call me with any questions or concerns. I look forward to our next call!  Wallace Cullens, PharmD, [place], CPP Clinical Pharmacist [facility] [phone]

## 2022-06-05 NOTE — Chronic Care Management (AMB) (Signed)
06/05/2022 Name: Kristin [name] MRN: [id number] DOB: 1941-04-06  Chief Complaint  Patient presents with   Medication Management    Kristin [name] is a 81 y.o. year old female who presented for a telephone visit.   They were referred to the pharmacist by their PCP for assistance in managing hypertension and medication adherence .    Subjective:  Care Team: Primary Care Provider: [name], NP ; Next Scheduled Visit: 07/24/2022 Cardiologist: Wellington Hampshire, MD Neurologist: Rufina Falco; Next Scheduled Visit: 06/12/2022 Rheumatologist: Shirlyn Goltz, MD; Next Scheduled Visit: 11/20/2022  Medication Access/Adherence  Current Pharmacy:  [facility] - [place], [address] [place] [address] Phone: [phone] Fax: [phone]   Patient reports affordability concerns with their medications: No  Patient reports access/transportation concerns to their pharmacy: No  Patient reports adherence concerns with their medications:  No  Patient's husband/caregiver continues to use weekly pillbox to organize patient's medications and administer medications to patient   Hypertension:  Current medications: Diltiazem CD 240 mg once daily Medications previously tried:   Patient has an automated, upper arm home BP cuff Latest blood pressure readings readings:  - In office: 06/02/2022: 122/70 - Home: 10/15: 158/77, HR 67  Caregiver reports cannot think of any factors (pain, medication, etc) that contributed to recent home elevated reading  Current physical activity: Reports limited home activity related to patient's memory, but reports has patient hold light hand weights as walks around to get some upper body lifting. - Today denies patient having interest in physical therapy referral as previously offered by Neurologist   Objective:  Lab Results  Component Value Date   CREATININE 1.22 (H) 01/06/2022   BUN 35 (H)  01/06/2022   NA 142 01/06/2022   K 4.1 01/06/2022   CL 104 01/06/2022   CO2 30 01/06/2022    Lab Results  Component Value Date   CHOL 189 01/06/2022   HDL 77 01/06/2022   LDLCALC 87 01/06/2022   LDLDIRECT 158.1 10/02/2013   TRIG 150 (H) 01/06/2022   CHOLHDL 2.5 01/06/2022   BP Readings from Last 3 Encounters:  01/06/22 138/76  09/21/21 (!) 156/62  09/02/21 (!) 158/70   Pulse Readings from Last 3 Encounters:  01/06/22 73  09/21/21 68  09/02/21 62     Medications Reviewed Today     Reviewed by Rennis Petty, RPH-CPP (Pharmacist) on 06/05/22 at [place] List Status: <None>   Medication Order Taking? Sig Documenting Provider Last Dose Status Informant  acetaminophen (TYLENOL) 325 MG tablet [id number] Yes Take 650 mg by mouth every 8 (eight) hours as needed (pain). [provider] Taking Active Self  ASPIRIN 81 PO [id number] Yes Take 2 tablets by mouth daily. [provider] Taking Active Self  Calcium Carbonate-Vitamin D (CALTRATE 600+D PO) [id number] Yes Take 1 tablet by mouth daily at 12 noon. [provider] Taking Active Self  Cranberry 400 MG TABS [id number] Yes Take 1 tablet by mouth daily. [provider] Taking Active   diclofenac Sodium (VOLTAREN) 1 % GEL [id number] Yes Apply 2 g topically 4 (four) times daily as needed. [provider] Taking Active   diltiazem (CARDIZEM CD) 240 MG 24 hr capsule [id number] Yes Take 1 capsule (240 mg total) by mouth daily. Wellington Hampshire, MD Taking Active   donepezil (ARICEPT) 10 MG tablet [id number] Yes Take 10 mg by mouth at bedtime. [provider] Taking Active  DULoxetine (CYMBALTA) 20 MG capsule [id number] Yes Take 1 capsule by mouth 2 (two) times daily. [provider] Taking Active     Discontinued 09/21/21 1042 (Dose change)   gabapentin (NEURONTIN) 400 MG capsule [id number] Yes Take 400 mg by mouth 2 (two) times daily. [provider] Taking Active    Lactobacillus Rhamnosus, GG, (CULTURELLE PO) [id number] Yes Take 1 capsule by mouth daily. [provider] Taking Active   memantine (NAMENDA) 10 MG tablet [id number] Yes TAKE 1 TABLET BY MOUTH TWICE DAILY Baity, Coralie Keens, NP Taking Active   Multiple Vitamin (MULTIVITAMIN) tablet [id number] Yes Take 1 tablet by mouth daily. [provider] Taking Active Self  Omega-3 Fatty Acids (FISH OIL) 1200 MG CAPS [id number] Yes Take 1 capsule by mouth daily.  [provider] Taking Active Self  Polyethyl Glycol-Propyl Glycol 0.4-0.3 % SOLN [id number] Yes Apply 1 drop to eye daily as needed (dry eyes). [provider] Taking Active Self  Red Yeast Rice 600 MG CAPS [id number]  Take 2 capsules by mouth daily. [provider]  Active               Assessment/Plan:   Hypertension: - Encourage patient/spouse to keep a log when checks home BP, contact Cardiology/PCP for readings outside established parameters and to bring to medical appointments - Caregiver reports he will contact Cardiology to schedule next follow up appointment    Follow Up Plan: Telephone appointment scheduled with patient/caregiver on 10/02/2022 at 1:45 pm   Wallace Cullens, PharmD, [place] [phone]

## 2022-06-12 DIAGNOSIS — R42 Dizziness and giddiness: Secondary | ICD-10-CM | POA: Diagnosis not present

## 2022-06-12 DIAGNOSIS — R262 Difficulty in walking, not elsewhere classified: Secondary | ICD-10-CM | POA: Diagnosis not present

## 2022-06-12 DIAGNOSIS — M48061 Spinal stenosis, lumbar region without neurogenic claudication: Secondary | ICD-10-CM | POA: Diagnosis not present

## 2022-06-12 DIAGNOSIS — G8929 Other chronic pain: Secondary | ICD-10-CM | POA: Diagnosis not present

## 2022-06-12 DIAGNOSIS — R413 Other amnesia: Secondary | ICD-10-CM | POA: Diagnosis not present

## 2022-06-12 DIAGNOSIS — M545 Low back pain, unspecified: Secondary | ICD-10-CM | POA: Diagnosis not present

## 2022-06-12 DIAGNOSIS — R2681 Unsteadiness on feet: Secondary | ICD-10-CM | POA: Diagnosis not present

## 2022-07-03 ENCOUNTER — Other Ambulatory Visit: Payer: Self-pay | Admitting: Cardiovascular Disease

## 2022-07-03 DIAGNOSIS — I493 Ventricular premature depolarization: Secondary | ICD-10-CM

## 2022-07-24 ENCOUNTER — Encounter: Payer: Self-pay | Admitting: Internal Medicine

## 2022-07-24 ENCOUNTER — Ambulatory Visit (INDEPENDENT_AMBULATORY_CARE_PROVIDER_SITE_OTHER): Payer: PPO | Admitting: Internal Medicine

## 2022-07-24 VITALS — BP 136/68 | HR 65 | Temp 96.3°F | Wt 148.0 lb

## 2022-07-24 DIAGNOSIS — M8589 Other specified disorders of bone density and structure, multiple sites: Secondary | ICD-10-CM

## 2022-07-24 DIAGNOSIS — N1832 Chronic kidney disease, stage 3b: Secondary | ICD-10-CM

## 2022-07-24 DIAGNOSIS — M159 Polyosteoarthritis, unspecified: Secondary | ICD-10-CM

## 2022-07-24 DIAGNOSIS — I252 Old myocardial infarction: Secondary | ICD-10-CM

## 2022-07-24 DIAGNOSIS — F01A0 Vascular dementia, mild, without behavioral disturbance, psychotic disturbance, mood disturbance, and anxiety: Secondary | ICD-10-CM

## 2022-07-24 DIAGNOSIS — M797 Fibromyalgia: Secondary | ICD-10-CM

## 2022-07-24 DIAGNOSIS — Z23 Encounter for immunization: Secondary | ICD-10-CM

## 2022-07-24 DIAGNOSIS — I25111 Atherosclerotic heart disease of native coronary artery with angina pectoris with documented spasm: Secondary | ICD-10-CM | POA: Diagnosis not present

## 2022-07-24 DIAGNOSIS — E782 Mixed hyperlipidemia: Secondary | ICD-10-CM | POA: Diagnosis not present

## 2022-07-24 DIAGNOSIS — G4733 Obstructive sleep apnea (adult) (pediatric): Secondary | ICD-10-CM | POA: Diagnosis not present

## 2022-07-24 DIAGNOSIS — I10 Essential (primary) hypertension: Secondary | ICD-10-CM | POA: Diagnosis not present

## 2022-07-24 DIAGNOSIS — Z8673 Personal history of transient ischemic attack (TIA), and cerebral infarction without residual deficits: Secondary | ICD-10-CM

## 2022-07-24 NOTE — Assessment & Plan Note (Signed)
C-Met and lipid profile today Continue fish oil, red yeast rice and aspirin

## 2022-07-24 NOTE — Assessment & Plan Note (Signed)
Encourage regular physical activity Continue duloxetine, Tylenol as needed Avoid anti-inflammatories due to CKD

## 2022-07-24 NOTE — Assessment & Plan Note (Signed)
Noncompliant with CPAP 

## 2022-07-24 NOTE — Progress Notes (Signed)
Subjective:    Patient ID: [name], female    DOB: 07-Sep-1940, 81 y.o.   MRN: [id number]  HPI  Patient presents to the clinic today for 3-monthfollow-up of chronic conditions.  HTN: Her BP today is 136/68.  She is taking Diltiazem as prescribed.  ECG from 08/2021 reviewed.  She follows with cardiology.  HLD with [name], Aortic Atherosclerosis status post MI/stroke: Her last LDL was 87, triglycerides 150, 12/2021.  She is taking Fish Oil and Red Yeast Rice OTC.  She is taking Aspirin as well.  She is not currently taking a statin medication.  She tries to consume a low-fat diet.  OA/Fibromyalgia: Mainly in her hands, back and knees.  Managed on Duloxetine, Tylenol and Ibuprofen.  She follows with rheumatology.  OSA: She averages 7 hours of sleep per night without the use of her CPAP.  There is no sleep study on file.  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 She needs assistance with bathing. Managed with Donepezil and Memantine.  She follows with neurology.  Osteopenia: She is taking Calcium and Vitamin D OTC.  She does not get much weightbearing exercise.  Bone density from 05/2022 reviewed.  CKD 3: Her last creatinine was 1.22, GFR 46, 12/2021.  She is not currently on an ACEI/ARB.  She does not follow with nephrology.  Review of Systems     Past Medical History:  Diagnosis Date   Allergy    Arthritis    recent falls -aug 2010   [name] (coronary artery disease)    [id number]  Complication of anesthesia    pt states takes a long time to wake her wake up   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 [place] 08/[place]   Cough    no fever    Degeneration of lumbar or lumbosacral intervertebral disc    Dizziness    r/t meds   DVT (deep venous thrombosis) (HCC)    Fibromyalgia    History of shingles    [name] (irritable bowel syndrome)    takes OTC PHardin Neguscolon health   Joint pain    Joint swelling     Myalgia and myositis, unspecified    Obstructive sleep apnea (adult) (pediatric)    Old myocardial infarction 1996   Osteoarthritis    Osteoarthrosis, unspecified whether generalized or localized, unspecified site    Psoriatic arthritis ([place]    Rheumatoid arthritis ([place]    Rosacea    Seasonal allergies    Stroke ([facility]    ischaemic microvascular disease   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[facility]    1991 , bleed and dizziness    Unspecified essential hypertension    takes Diltiazem,Metoprolol,and Losartan daily   UTI (lower urinary tract infection)    frequent=last one was winter of 2014    Current Outpatient Medications  Medication Sig Dispense Refill   acetaminophen (TYLENOL) 325 MG tablet Take 650 mg by mouth every 8 (eight) hours as needed (pain).     ASPIRIN 81 PO Take 2 tablets by mouth daily.     Calcium Carbonate-Vitamin D (CALTRATE 600+D PO) Take 1 tablet by mouth daily at 12 noon.     Cranberry 400 MG TABS Take 1 tablet by mouth daily.     diclofenac Sodium (VOLTAREN) 1 % GEL Apply 2 g topically 4 (four) times daily as needed.     diltiazem (CARDIZEM CD) 240 MG 24 hr capsule TAKE 1 CAPSULE  BY MOUTH DAILY 90 capsule 0   donepezil (ARICEPT) 10 MG tablet Take 10 mg by mouth at bedtime.     DULoxetine (CYMBALTA) 20 MG capsule Take 1 capsule by mouth 2 (two) times daily.     gabapentin (NEURONTIN) 400 MG capsule Take 400 mg by mouth 2 (two) times daily.     Lactobacillus Rhamnosus, GG, (CULTURELLE PO) Take 1 capsule by mouth daily.     memantine (NAMENDA) 10 MG tablet TAKE 1 TABLET BY MOUTH TWICE DAILY 180 tablet 0   Multiple Vitamin (MULTIVITAMIN) tablet Take 1 tablet by mouth daily.     Omega-3 Fatty Acids (FISH OIL) 1200 MG CAPS Take 1 capsule by mouth daily.      Polyethyl Glycol-Propyl Glycol 0.4-0.3 % SOLN Apply 1 drop to eye daily as needed (dry eyes).     Red Yeast Rice 600 MG CAPS Take 2 capsules by mouth daily.     No current facility-administered  medications for this visit.    Allergies  Allergen Reactions   Angiotensin Receptor Blockers Anaphylaxis    Fatigue & Dizziness   Other Anaphylaxis    Other reaction(s): Other (See Comments) Dizziness, rash Fatigue & Dizziness   Hydralazine Other (See Comments)    Unknown   Latex     REACTION: Rash, blisters, breathing probs.   Losartan     Dizziness    Methotrexate Derivatives     Dizziness, rash    Spironolactone     Dizziness    Penicillins Hives and Rash    Family History  Problem Relation Age of Onset   Colon cancer Father    Heart attack Mother    Coronary artery disease Mother    Osteoporosis Mother    Heart disease Mother    Heart failure Mother    GER disease Sister    Skin cancer Sister    Hypertension Sister    Osteopenia Sister    GER disease Sister    Breast cancer Paternal Aunt        age 71   Heart failure Maternal Grandmother    Heart failure Maternal Grandfather     Social History   Socioeconomic History   Marital status: Married    Spouse name: Agricultural consultant   Number of children: 2   Years of education: BSN   Highest education level: Not on file  Occupational History   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 She is DNR, [name] is husband [name] , then son [name]       10/22/12-cardiac work up found the reason for her dizziness,  she needs no neurological  follow up for that.  her enthusiasm for CPAP remains limited and is explained by her resiudal high AHI after she finally  reached compliance ,[place] 13 points.     95% pressure is 11.7 cm , AHI  20 .7- she does not benefit from CPAP . She is allowed to d/c the machine and we need to concerntrate on her dizziness and facial right droop. ordered MRI brain repeat.    Eye exam next week. PT at [facility]. MRI brain in April- per patients request order next month.          Social Determinants of Health   Financial Resource Strain: Low Risk  (06/13/2021)   Overall Financial Resource Strain (CARDIA)    Difficulty of Paying Living Expenses: Not hard at all  Food Insecurity: No Food Insecurity (06/13/2021)   Hunger Vital Sign    Worried About Running Out of Food in the Last Year: Never true    Ran Out of Food in the Last Year: Never true  Transportation Needs: No Transportation Needs (01/11/2022)   PRAPARE - Hydrologist (Medical): No    Lack of Transportation (Non-Medical): No  Physical Activity: Insufficiently Active (06/13/2021)   Exercise Vital Sign    Days of Exercise per Week: 3 days    Minutes of Exercise per Session: 20 min  Stress: No Stress Concern Present (01/11/2022)   [place]    Feeling of Stress : Not at all  Social Connections: Moderately Isolated (06/13/2021)   Social Connection and Isolation Panel [NHANES]    Frequency of Communication with Friends and Family: More than three times a week    Frequency of Social Gatherings with Friends and Family: More than three times a week    Attends Religious Services: Never    Marine scientist or Organizations: No    Attends Archivist Meetings: Never    Marital Status: Married  Human resources officer Violence: Not At Risk (06/13/2021)   Humiliation, Afraid, Rape, and Kick questionnaire    Fear of Current or Ex-Partner: No     Emotionally Abused: No    Physically Abused: No    Sexually Abused: No     Constitutional: Denies fever, malaise, fatigue, headache or abrupt weight changes.  HEENT: Denies eye pain, eye redness, ear pain, ringing in the ears, wax buildup, runny nose, nasal congestion, bloody nose, or sore throat. Respiratory: Denies difficulty breathing, shortness of breath, cough or sputum production.   Cardiovascular: Patient's husband reports swelling in legs.  Denies chest pain, chest tightness, palpitations or swelling in the hands.  Gastrointestinal: Denies abdominal pain, bloating, constipation, diarrhea or blood in the stool.  GU: Denies urgency, frequency, pain with urination, burning sensation, blood in urine, odor or discharge. Musculoskeletal: Patient reports chronic muscle and joint pain.  Denies decrease in range of motion, difficulty with gait, or joint swelling.  Skin: Denies redness, rashes, lesions or ulcercations.  Neurological: Patient reports difficulty with memory.  Denies dizziness, difficulty with speech or problems with balance and coordination.  Psych: Denies anxiety, depression, SI/HI.  No other specific complaints in a complete review of systems (except as listed in HPI above).  Objective:   Physical Exam  BP 136/68 (BP Location: Right Arm, Patient Position: Sitting, Cuff Size: Normal)   Pulse 65   Temp (!) 96.3 F (35.7 C) (Temporal)   Wt 148 lb (67.1 kg)   LMP 08/14/1989   SpO2 98%   BMI 23.18 kg/m   Wt Readings from Last 3 Encounters:  01/06/22 140 lb (63.5 kg)  09/21/21 128 lb (  58.1 kg)  09/02/21 129 lb 4 oz (58.6 kg)    General: Appears her stated age, chronically ill-appearing, in NAD. Skin: Warm, dry and intact.  HEENT: Head: normal shape and size; Eyes: sclera white, no icterus, conjunctiva pink, PERRLA and EOMs intact;  Cardiovascular: Normal rate and rhythm. S1,S2 noted.  No murmur, rubs or gallops noted.  1+ pitting BLE edema. No carotid bruits  noted. Pulmonary/Chest: Normal effort and positive vesicular breath sounds. No respiratory distress. No wheezes, rales or ronchi noted.  Abdomen: Soft and nontender. Normal bowel sounds.  Musculoskeletal: Kyphotic.  Gait slow and steady without device. Neurological: Alert to person.  She has obvious trouble with recall, short-term memory and long-term memory.  Coordination normal.  Psychiatric: Mood and affect flat. Behavior is normal. Judgment and thought content normal.    BMET    Component Value Date/Time   NA 142 01/06/2022 1337   NA 136 12/29/2016 1036   K 4.1 01/06/2022 1337   CL 104 01/06/2022 1337   CO2 30 01/06/2022 1337   GLUCOSE 88 01/06/2022 1337   BUN 35 (H) 01/06/2022 1337   BUN 58 (H) 12/29/2016 1036   CREATININE 1.22 (H) 01/06/2022 1337   CALCIUM 9.7 01/06/2022 1337   GFRNONAA 46 (L) 01/14/2021 0829   GFRNONAA 43 (L) 11/03/2019 0941   GFRAA 49 (L) 11/03/2019 0941    Lipid Panel     Component Value Date/Time   CHOL 189 01/06/2022 1337   TRIG 150 (H) 01/06/2022 1337   HDL 77 01/06/2022 1337   CHOLHDL 2.5 01/06/2022 1337   VLDL 21.2 02/21/2018 1035   LDLCALC 87 01/06/2022 1337    CBC    Component Value Date/Time   WBC 9.7 01/06/2022 1337   RBC 3.65 (L) 01/06/2022 1337   HGB 11.8 01/06/2022 1337   HGB 12.1 03/22/2009 1331   HCT 34.7 (L) 01/06/2022 1337   HCT 36.1 03/22/2009 1331   PLT 258 01/06/2022 1337   PLT 257 03/22/2009 1331   MCV 95.1 01/06/2022 1337   MCV 97.5 03/22/2009 1331   MCH 32.3 01/06/2022 1337   MCHC 34.0 01/06/2022 1337   RDW 14.0 01/06/2022 1337   RDW 15.9 (H) 03/22/2009 1331   LYMPHSABS 1,177 11/03/2019 0941   LYMPHSABS 1.6 03/22/2009 1331   MONOABS 0.7 06/14/2018 1320   MONOABS 0.6 03/22/2009 1331   EOSABS 370 11/03/2019 0941   EOSABS 0.1 03/22/2009 1331   BASOSABS 81 11/03/2019 0941   BASOSABS 0.1 03/22/2009 1331    Hgb A1C No results found for: "HGBA1C"         Assessment & Plan:     RTC in 6 months for your  annual exam Webb Silversmith, NP

## 2022-07-24 NOTE — Assessment & Plan Note (Signed)
Encouraged weightbearing exercise Continue calcium and vitamin D

## 2022-07-24 NOTE — Assessment & Plan Note (Signed)
Controlled on diltiazem Reinforced DASH diet C-Met today

## 2022-07-24 NOTE — Assessment & Plan Note (Signed)
C-Met and lipid profile today Encouraged her to consume a low-fat diet Continue fish oil, red yeast rice, diltiazem and aspirin

## 2022-07-24 NOTE — Assessment & Plan Note (Signed)
Continue donepezil and memantine Discussed long-term plan with husband, she is currently in adult daycare 2 times a week.  He plans on caring for her as long as he can She will continue to follow with neurology

## 2022-07-24 NOTE — Patient Instructions (Signed)
Dementia Caregiver Guide Dementia is a term used to describe a number of symptoms that affect memory and thinking. The most common symptoms include: Memory loss. Trouble with language and communication. Trouble concentrating. Poor judgment and problems with reasoning. Wandering from home or public places. Extreme anxiety or depression. Being suspicious or having angry outbursts and accusations. Child-like behavior and language. Dementia can be frightening and confusing. And taking care of someone with dementia can be challenging. This guide provides tips to help you when providing care for a person with dementia. How to help manage lifestyle changes Dementia usually gets worse slowly over time. In the early stages, people with dementia can stay independent and safe with some help. In later stages, they need help with daily tasks such as dressing, grooming, and using the bathroom. There are actions you can take to help a person manage his or her life while living with this condition. Communicating When the person is talking or seems frustrated, make eye contact and hold the person's hand. Ask specific questions that need yes or no answers. Use simple words, short sentences, and a calm voice. Only give one direction at a time. When offering choices, limit the person to just one or two. Avoid correcting the person in a negative way. If the person is struggling to find the right words, gently try to help him or her. Preventing injury  Keep floors clear of clutter. Remove rugs, magazine racks, and floor lamps. Keep hallways well lit, especially at night. Put a handrail and nonslip mat in the bathtub or shower. Put childproof locks on cabinets that contain dangerous items, such as medicines, alcohol, guns, toxic cleaning items, sharp tools or utensils, matches, and lighters. For doors to the outside of the house, put the locks in places where the person cannot see or reach them easily. This will  help ensure that the person does not wander out of the house and get lost. Be prepared for emergencies. Keep a list of emergency phone numbers and addresses in a convenient area. Remove car keys and lock garage doors so that the person does not try to get in the car and drive. Have the person wear a bracelet that tracks locations and identifies the person as having memory problems. This should be worn at all times for safety. Helping with daily life  Keep the person on track with his or her routine. Try to identify areas where the person may need help. Be supportive, patient, calm, and encouraging. Gently remind the person that adjusting to changes takes time. Help with the tasks that the person has asked for help with. Keep the person involved in daily tasks and decisions as much as possible. Encourage conversation, but try not to get frustrated if the person struggles to find words or does not seem to appreciate your help. How to recognize stress Look for signs of stress in yourself and in the person you are caring for. If you notice signs of stress, take steps to manage it. Symptoms of stress include: Feeling anxious, irritable, frustrated, or angry. Denying that the person has dementia or that his or her symptoms will not improve. Feeling depressed, hopeless, or unappreciated. Difficulty sleeping. Difficulty concentrating. Developing stress-related health problems. Feeling like you have too little time for your own life. Follow these instructions at home: Take care of your health Make sure that you and the person you are caring for: Get regular sleep. Exercise regularly. Eat regular, nutritious meals. Take over-the-counter and prescription medicines only   as told by your health care providers. Drink enough fluid to keep your urine pale yellow. Attend all scheduled health care appointments.  General instructions Join a support group with others who are caregivers. Ask about  respite care resources. Respite care can provide short-term care for the person so that you can have a regular break from the stress of caregiving. Consider any safety risks and take steps to avoid them. Organize medicines in a pill box for each day of the week. Create a plan to handle any legal or financial matters. Get legal or financial advice if needed. Keep a calendar in a central location to remind the person of appointments or other activities. Where to find support: Many individuals and organizations offer support. These include: Support groups for people with dementia. Support groups for caregivers. Counselors or therapists. Home health care services. Adult day care centers. Where to find more information Centers for Disease Control and Prevention: www.cdc.gov Alzheimer's Association: www.alz.org Family Caregiver Alliance: www.caregiver.org Alzheimer's Foundation of America: www.alzfdn.org Contact a health care provider if: The person's health is rapidly getting worse. You are no longer able to care for the person. Caring for the person is affecting your physical and emotional health. You are feeling depressed or anxious about caring for the person. Get help right away if: The person threatens himself or herself, you, or anyone else. You feel depressed or sad, or feel that you want to harm yourself. If you ever feel like your loved one may hurt himself or herself or others, or if he or she shares thoughts about taking his or her own life, get help right away. You can go to your nearest emergency department or: Call your local emergency services (911 in the [place] Call a suicide crisis helpline, such as the National Suicide Prevention Lifeline at 1-800-273-8255 or 988 in the [place] This is open 24 hours a day in the [place] Text the Crisis Text Line at 741741 (in the [place] Summary Dementia is a term used to describe a number of symptoms that affect memory and thinking. Dementia  usually gets worse slowly over time. Take steps to reduce the person's risk of injury and to plan for future care. Caregivers need support, relief from caregiving, and time for their own lives. This information is not intended to replace advice given to you by your health care provider. Make sure you discuss any questions you have with your health care provider. Document Revised: 02/23/2021 Document Reviewed: 12/15/2019 Elsevier Patient Education  2023 Elsevier Inc.  

## 2022-07-24 NOTE — Assessment & Plan Note (Signed)
C-Met and lipid profile today Encouraged her to consume a low-fat diet Continue fish oil, red yeast rice and aspirin 

## 2022-07-24 NOTE — Assessment & Plan Note (Signed)
Continue duloxetine and Tylenol Avoid anti-inflammatories due to CKD

## 2022-07-24 NOTE — Assessment & Plan Note (Signed)
C-Met today Avoid anti-inflammatories

## 2022-07-24 NOTE — Assessment & Plan Note (Signed)
C-Met and lipid profile today Continue red yeast rice, fish oil and aspirin

## 2022-07-25 LAB — COMPLETE METABOLIC PANEL WITH GFR
AG Ratio: 1.3 (calc) (ref 1.0–2.5)
ALT: 14 U/L (ref 6–29)
AST: 19 U/L (ref 10–35)
Albumin: 3.9 g/dL (ref 3.6–5.1)
Alkaline phosphatase (APISO): 63 U/L (ref 37–153)
BUN/Creatinine Ratio: 18 (calc) (ref 6–22)
BUN: 21 mg/dL (ref 7–25)
CO2: 31 mmol/L (ref 20–32)
Calcium: 9.5 mg/dL (ref 8.6–10.4)
Chloride: 102 mmol/L (ref 98–110)
Creat: 1.16 mg/dL — ABNORMAL HIGH (ref 0.60–0.95)
Globulin: 3 g/dL (calc) (ref 1.9–3.7)
Glucose, Bld: 116 mg/dL — ABNORMAL HIGH (ref 65–99)
Potassium: 3.6 mmol/L (ref 3.5–5.3)
Sodium: 143 mmol/L (ref 135–146)
Total Bilirubin: 0.3 mg/dL (ref 0.2–1.2)
Total Protein: 6.9 g/dL (ref 6.1–8.1)
eGFR: 47 mL/min/{1.73_m2} — ABNORMAL LOW (ref >=60)

## 2022-07-25 LAB — CBC
HCT: 38.5 % (ref 35.0–45.0)
Hemoglobin: 12.9 g/dL (ref 11.7–15.5)
MCH: 31.7 pg (ref 27.0–33.0)
MCHC: 33.5 g/dL (ref 32.0–36.0)
MCV: 94.6 fL (ref 80.0–100.0)
MPV: 9.7 fL (ref 7.5–12.5)
Platelets: 265 10*3/uL (ref 140–400)
RBC: 4.07 10*6/uL (ref 3.80–5.10)
RDW: 13.7 % (ref 11.0–15.0)
WBC: 7.4 10*3/uL (ref 3.8–10.8)

## 2022-07-25 LAB — LIPID PANEL
Cholesterol: 207 mg/dL — ABNORMAL HIGH (ref <200)
HDL: 82 mg/dL (ref >=50)
LDL Cholesterol (Calc): 104 mg/dL (calc) — ABNORMAL HIGH
Non-HDL Cholesterol (Calc): 125 mg/dL (calc) (ref <130)
Total CHOL/HDL Ratio: 2.5 (calc) (ref <5.0)
Triglycerides: 109 mg/dL (ref <150)

## 2022-07-31 ENCOUNTER — Encounter: Payer: Self-pay | Admitting: Podiatry

## 2022-07-31 ENCOUNTER — Ambulatory Visit: Payer: PPO | Admitting: Podiatry

## 2022-07-31 DIAGNOSIS — M79609 Pain in unspecified limb: Secondary | ICD-10-CM | POA: Diagnosis not present

## 2022-07-31 DIAGNOSIS — B351 Tinea unguium: Secondary | ICD-10-CM | POA: Diagnosis not present

## 2022-07-31 DIAGNOSIS — M205X9 Other deformities of toe(s) (acquired), unspecified foot: Secondary | ICD-10-CM

## 2022-07-31 DIAGNOSIS — Q666 Other congenital valgus deformities of feet: Secondary | ICD-10-CM

## 2022-07-31 NOTE — Progress Notes (Signed)
This patient returns to my office for at risk foot care.  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  This patient is unable to cut nails herself since the patient cannot reach her nails.These nails are painful walking and wearing shoes. This patient presents for at risk foot care today.  General Appearance  Alert, conversant and in no acute stress.  Vascular  Dorsalis pedis and posterior tibial  pulses are palpable  bilaterally.  Capillary return is within normal limits  bilaterally. Temperature is within normal limits  bilaterally.  Neurologic  Senn-Weinstein monofilament wire test within normal limits  bilaterally. Muscle power within normal limits bilaterally.  Nails Thick disfigured discolored nails with subungual debris  from hallux to fifth toes bilaterally. No evidence of bacterial infection or drainage bilaterally.  Orthopedic  No limitations of motion  feet .  No crepitus or effusions noted.  No bony pathology or digital deformities noted. 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  Hammer toes second  B/L.    Skin  normotropic skin with no porokeratosis noted bilaterally.  No signs of infections or ulcers noted.   Onychomycosis  Pain in right toes  Pain in left toes  Consent was obtained for treatment procedures.   Mechanical debridement of nails 1-5  bilaterally performed with a nail nipper.    Return office visit   4 months                   Told patient to return for periodic foot care and evaluation due to potential at risk complications.   Gardiner Barefoot DPM

## 2022-08-23 ENCOUNTER — Encounter: Payer: Self-pay | Admitting: *Deleted

## 2022-08-23 ENCOUNTER — Emergency Department: Payer: PPO

## 2022-08-23 ENCOUNTER — Other Ambulatory Visit: Payer: Self-pay

## 2022-08-23 ENCOUNTER — Inpatient Hospital Stay
Admission: EM | Admit: 2022-08-23 | Discharge: 2022-08-31 | DRG: 536 | Disposition: A | Discharge status: Skilled Nursing Facility | Payer: PPO | Source: Ambulatory Visit | Attending: Internal Medicine | Admitting: Internal Medicine

## 2022-08-23 DIAGNOSIS — L89616 Pressure-induced deep tissue damage of right heel: Secondary | ICD-10-CM | POA: Diagnosis not present

## 2022-08-23 DIAGNOSIS — S32810D Multiple fractures of pelvis with stable disruption of pelvic ring, subsequent encounter for fracture with routine healing: Secondary | ICD-10-CM | POA: Diagnosis not present

## 2022-08-23 DIAGNOSIS — E876 Hypokalemia: Secondary | ICD-10-CM | POA: Diagnosis not present

## 2022-08-23 DIAGNOSIS — Y92019 Unspecified place in single-family (private) house as the place of occurrence of the external cause: Secondary | ICD-10-CM | POA: Diagnosis not present

## 2022-08-23 DIAGNOSIS — M797 Fibromyalgia: Secondary | ICD-10-CM | POA: Diagnosis present

## 2022-08-23 DIAGNOSIS — I2510 Atherosclerotic heart disease of native coronary artery without angina pectoris: Secondary | ICD-10-CM | POA: Diagnosis not present

## 2022-08-23 DIAGNOSIS — S32591A Other specified fracture of right pubis, initial encounter for closed fracture: Secondary | ICD-10-CM | POA: Diagnosis not present

## 2022-08-23 DIAGNOSIS — I672 Cerebral atherosclerosis: Secondary | ICD-10-CM | POA: Diagnosis present

## 2022-08-23 DIAGNOSIS — R41841 Cognitive communication deficit: Secondary | ICD-10-CM | POA: Diagnosis not present

## 2022-08-23 DIAGNOSIS — Z888 Allergy status to other drugs, medicaments and biological substances status: Secondary | ICD-10-CM

## 2022-08-23 DIAGNOSIS — R54 Age-related physical debility: Secondary | ICD-10-CM | POA: Diagnosis not present

## 2022-08-23 DIAGNOSIS — R339 Retention of urine, unspecified: Secondary | ICD-10-CM | POA: Diagnosis not present

## 2022-08-23 DIAGNOSIS — L89626 Pressure-induced deep tissue damage of left heel: Secondary | ICD-10-CM | POA: Diagnosis not present

## 2022-08-23 DIAGNOSIS — Z8249 Family history of ischemic heart disease and other diseases of the circulatory system: Secondary | ICD-10-CM

## 2022-08-23 DIAGNOSIS — R0789 Other chest pain: Secondary | ICD-10-CM | POA: Diagnosis not present

## 2022-08-23 DIAGNOSIS — Z1152 Encounter for screening for COVID-19: Secondary | ICD-10-CM | POA: Diagnosis not present

## 2022-08-23 DIAGNOSIS — Z803 Family history of malignant neoplasm of breast: Secondary | ICD-10-CM | POA: Diagnosis not present

## 2022-08-23 DIAGNOSIS — Z91040 Latex allergy status: Secondary | ICD-10-CM

## 2022-08-23 DIAGNOSIS — S32511A Fracture of superior rim of right pubis, initial encounter for closed fracture: Secondary | ICD-10-CM | POA: Diagnosis not present

## 2022-08-23 DIAGNOSIS — L4050 Arthropathic psoriasis, unspecified: Secondary | ICD-10-CM | POA: Diagnosis present

## 2022-08-23 DIAGNOSIS — G629 Polyneuropathy, unspecified: Secondary | ICD-10-CM

## 2022-08-23 DIAGNOSIS — N183 Chronic kidney disease, stage 3 unspecified: Secondary | ICD-10-CM | POA: Diagnosis not present

## 2022-08-23 DIAGNOSIS — R1312 Dysphagia, oropharyngeal phase: Secondary | ICD-10-CM | POA: Diagnosis not present

## 2022-08-23 DIAGNOSIS — Z800 Family history of malignant neoplasm of digestive organs: Secondary | ICD-10-CM

## 2022-08-23 DIAGNOSIS — U071 COVID-19: Secondary | ICD-10-CM | POA: Diagnosis not present

## 2022-08-23 DIAGNOSIS — Z66 Do not resuscitate: Secondary | ICD-10-CM | POA: Diagnosis not present

## 2022-08-23 DIAGNOSIS — M069 Rheumatoid arthritis, unspecified: Secondary | ICD-10-CM | POA: Diagnosis present

## 2022-08-23 DIAGNOSIS — M8580 Other specified disorders of bone density and structure, unspecified site: Secondary | ICD-10-CM | POA: Diagnosis not present

## 2022-08-23 DIAGNOSIS — M1990 Unspecified osteoarthritis, unspecified site: Secondary | ICD-10-CM | POA: Diagnosis present

## 2022-08-23 DIAGNOSIS — M6281 Muscle weakness (generalized): Secondary | ICD-10-CM | POA: Diagnosis not present

## 2022-08-23 DIAGNOSIS — Z8673 Personal history of transient ischemic attack (TIA), and cerebral infarction without residual deficits: Secondary | ICD-10-CM

## 2022-08-23 DIAGNOSIS — Z8262 Family history of osteoporosis: Secondary | ICD-10-CM

## 2022-08-23 DIAGNOSIS — G4733 Obstructive sleep apnea (adult) (pediatric): Secondary | ICD-10-CM | POA: Diagnosis present

## 2022-08-23 DIAGNOSIS — M25551 Pain in right hip: Secondary | ICD-10-CM | POA: Diagnosis not present

## 2022-08-23 DIAGNOSIS — F0390 Unspecified dementia without behavioral disturbance: Secondary | ICD-10-CM | POA: Diagnosis not present

## 2022-08-23 DIAGNOSIS — Z7982 Long term (current) use of aspirin: Secondary | ICD-10-CM

## 2022-08-23 DIAGNOSIS — S32591D Other specified fracture of right pubis, subsequent encounter for fracture with routine healing: Secondary | ICD-10-CM | POA: Diagnosis not present

## 2022-08-23 DIAGNOSIS — I252 Old myocardial infarction: Secondary | ICD-10-CM | POA: Diagnosis not present

## 2022-08-23 DIAGNOSIS — W010XXA Fall on same level from slipping, tripping and stumbling without subsequent striking against object, initial encounter: Secondary | ICD-10-CM | POA: Diagnosis present

## 2022-08-23 DIAGNOSIS — K589 Irritable bowel syndrome without diarrhea: Secondary | ICD-10-CM | POA: Diagnosis present

## 2022-08-23 DIAGNOSIS — Z043 Encounter for examination and observation following other accident: Secondary | ICD-10-CM | POA: Diagnosis not present

## 2022-08-23 DIAGNOSIS — W19XXXA Unspecified fall, initial encounter: Principal | ICD-10-CM

## 2022-08-23 DIAGNOSIS — S8991XA Unspecified injury of right lower leg, initial encounter: Secondary | ICD-10-CM | POA: Diagnosis not present

## 2022-08-23 DIAGNOSIS — S32810A Multiple fractures of pelvis with stable disruption of pelvic ring, initial encounter for closed fracture: Secondary | ICD-10-CM | POA: Diagnosis not present

## 2022-08-23 DIAGNOSIS — Z7401 Bed confinement status: Secondary | ICD-10-CM | POA: Diagnosis not present

## 2022-08-23 DIAGNOSIS — Z7189 Other specified counseling: Secondary | ICD-10-CM | POA: Diagnosis not present

## 2022-08-23 DIAGNOSIS — E785 Hyperlipidemia, unspecified: Secondary | ICD-10-CM | POA: Diagnosis not present

## 2022-08-23 DIAGNOSIS — S8992XA Unspecified injury of left lower leg, initial encounter: Secondary | ICD-10-CM | POA: Diagnosis not present

## 2022-08-23 DIAGNOSIS — M542 Cervicalgia: Secondary | ICD-10-CM | POA: Diagnosis not present

## 2022-08-23 DIAGNOSIS — I739 Peripheral vascular disease, unspecified: Secondary | ICD-10-CM | POA: Diagnosis not present

## 2022-08-23 DIAGNOSIS — Z79899 Other long term (current) drug therapy: Secondary | ICD-10-CM

## 2022-08-23 DIAGNOSIS — I25111 Atherosclerotic heart disease of native coronary artery with angina pectoris with documented spasm: Secondary | ICD-10-CM | POA: Diagnosis not present

## 2022-08-23 DIAGNOSIS — M47816 Spondylosis without myelopathy or radiculopathy, lumbar region: Secondary | ICD-10-CM | POA: Diagnosis not present

## 2022-08-23 DIAGNOSIS — M1711 Unilateral primary osteoarthritis, right knee: Secondary | ICD-10-CM | POA: Diagnosis not present

## 2022-08-23 DIAGNOSIS — S0990XA Unspecified injury of head, initial encounter: Secondary | ICD-10-CM | POA: Diagnosis not present

## 2022-08-23 DIAGNOSIS — I959 Hypotension, unspecified: Secondary | ICD-10-CM | POA: Diagnosis not present

## 2022-08-23 DIAGNOSIS — Z880 Allergy status to penicillin: Secondary | ICD-10-CM

## 2022-08-23 DIAGNOSIS — Z808 Family history of malignant neoplasm of other organs or systems: Secondary | ICD-10-CM

## 2022-08-23 DIAGNOSIS — R52 Pain, unspecified: Secondary | ICD-10-CM

## 2022-08-23 DIAGNOSIS — Z515 Encounter for palliative care: Secondary | ICD-10-CM | POA: Diagnosis not present

## 2022-08-23 DIAGNOSIS — I10 Essential (primary) hypertension: Secondary | ICD-10-CM | POA: Diagnosis not present

## 2022-08-23 DIAGNOSIS — F0153 Vascular dementia, unspecified severity, with mood disturbance: Secondary | ICD-10-CM | POA: Diagnosis present

## 2022-08-23 DIAGNOSIS — S79911A Unspecified injury of right hip, initial encounter: Secondary | ICD-10-CM | POA: Diagnosis not present

## 2022-08-23 LAB — CBC
HCT: 39.3 % (ref 36.0–46.0)
Hemoglobin: 12.9 g/dL (ref 12.0–15.0)
MCH: 32.3 pg (ref 26.0–34.0)
MCHC: 32.8 g/dL (ref 30.0–36.0)
MCV: 98.5 fL (ref 80.0–100.0)
Platelets: 243 10*3/uL (ref 150–400)
RBC: 3.99 MIL/uL (ref 3.87–5.11)
RDW: 14.6 % (ref 11.5–15.5)
WBC: 11.8 10*3/uL — ABNORMAL HIGH (ref 4.0–10.5)
nRBC: 0 % (ref 0.0–0.2)

## 2022-08-23 LAB — RESP PANEL BY RT-PCR (RSV, FLU A&B, COVID)  RVPGX2
Influenza A by PCR: NEGATIVE
Influenza B by PCR: NEGATIVE
Resp Syncytial Virus by PCR: NEGATIVE
SARS Coronavirus 2 by RT PCR: NEGATIVE

## 2022-08-23 LAB — BASIC METABOLIC PANEL
Anion gap: 13 (ref 5–15)
BUN: 21 mg/dL (ref 8–23)
CO2: 27 mmol/L (ref 22–32)
Calcium: 9.2 mg/dL (ref 8.9–10.3)
Chloride: 99 mmol/L (ref 98–111)
Creatinine, Ser: 1.17 mg/dL — ABNORMAL HIGH (ref 0.44–1.00)
GFR, Estimated: 47 mL/min — ABNORMAL LOW (ref >60)
Glucose, Bld: 110 mg/dL — ABNORMAL HIGH (ref 70–99)
Potassium: 3.1 mmol/L — ABNORMAL LOW (ref 3.5–5.1)
Sodium: 139 mmol/L (ref 135–145)

## 2022-08-23 LAB — TROPONIN I (HIGH SENSITIVITY): Troponin I (High Sensitivity): 13 ng/L (ref <18)

## 2022-08-23 MED ORDER — CRANBERRY 400 MG PO TABS: 1.0000 | ORAL_TABLET | Freq: Every day | ORAL | Status: DC

## 2022-08-23 MED ORDER — MEMANTINE HCL 5 MG PO TABS
10.0000 mg | ORAL_TABLET | Freq: Two times a day (BID) | ORAL | Status: DC
  Administered 2022-08-24 – 2022-08-31 (×15): 10 mg via ORAL
  Filled 2022-08-23 (×16): qty 2

## 2022-08-23 MED ORDER — DILTIAZEM HCL ER COATED BEADS 240 MG PO CP24
240.0000 mg | ORAL_CAPSULE | Freq: Every day | ORAL | Status: DC
  Administered 2022-08-24 – 2022-08-31 (×8): 240 mg via ORAL
  Filled 2022-08-23 (×8): qty 1

## 2022-08-23 MED ORDER — ACETAMINOPHEN 650 MG RE SUPP: 650.0000 mg | Freq: Four times a day (QID) | RECTAL | Status: DC | PRN

## 2022-08-23 MED ORDER — POTASSIUM CHLORIDE IN NACL 20-0.9 MEQ/L-% IV SOLN
INTRAVENOUS | Status: DC
  Filled 2022-08-23 (×3): qty 1000

## 2022-08-23 MED ORDER — DONEPEZIL HCL 5 MG PO TABS
10.0000 mg | ORAL_TABLET | Freq: Every day | ORAL | Status: DC
  Administered 2022-08-24 – 2022-08-30 (×8): 10 mg via ORAL
  Filled 2022-08-23 (×8): qty 2

## 2022-08-23 MED ORDER — MAGNESIUM HYDROXIDE 400 MG/5ML PO SUSP
30.0000 mL | Freq: Every day | ORAL | Status: DC | PRN
  Administered 2022-08-25: 30 mL via ORAL
  Filled 2022-08-23: qty 30

## 2022-08-23 MED ORDER — OXYCODONE-ACETAMINOPHEN 5-325 MG PO TABS
1.0000 | ORAL_TABLET | Freq: Once | ORAL | Status: AC
  Administered 2022-08-23: 1 via ORAL
  Filled 2022-08-23: qty 1

## 2022-08-23 MED ORDER — ASPIRIN 81 MG PO CHEW
81.0000 mg | CHEWABLE_TABLET | Freq: Every day | ORAL | Status: DC
  Administered 2022-08-24 – 2022-08-31 (×8): 81 mg via ORAL
  Filled 2022-08-23 (×8): qty 1

## 2022-08-23 MED ORDER — ENOXAPARIN SODIUM 40 MG/0.4ML IJ SOSY
40.0000 mg | PREFILLED_SYRINGE | INTRAMUSCULAR | Status: DC
  Administered 2022-08-24 – 2022-08-31 (×8): 40 mg via SUBCUTANEOUS
  Filled 2022-08-23 (×8): qty 0.4

## 2022-08-23 MED ORDER — RED YEAST RICE 600 MG PO CAPS: 2.0000 | ORAL_CAPSULE | Freq: Every day | ORAL | Status: DC

## 2022-08-23 MED ORDER — MORPHINE SULFATE (PF) 4 MG/ML IV SOLN
4.0000 mg | Freq: Once | INTRAVENOUS | Status: AC
  Administered 2022-08-23: 4 mg via INTRAVENOUS
  Filled 2022-08-23: qty 1

## 2022-08-23 MED ORDER — DICLOFENAC SODIUM 1 % EX GEL
2.0000 g | Freq: Four times a day (QID) | CUTANEOUS | Status: DC | PRN
  Administered 2022-08-26 – 2022-08-31 (×2): 2 g via TOPICAL
  Filled 2022-08-23: qty 100

## 2022-08-23 MED ORDER — ACETAMINOPHEN 325 MG PO TABS
650.0000 mg | ORAL_TABLET | Freq: Four times a day (QID) | ORAL | Status: DC | PRN
  Administered 2022-08-28 – 2022-08-31 (×3): 650 mg via ORAL
  Filled 2022-08-23 (×4): qty 2

## 2022-08-23 MED ORDER — ADULT MULTIVITAMIN W/MINERALS CH
1.0000 | ORAL_TABLET | Freq: Every day | ORAL | Status: DC
  Administered 2022-08-24 – 2022-08-31 (×8): 1 via ORAL
  Filled 2022-08-23 (×8): qty 1

## 2022-08-23 MED ORDER — DULOXETINE HCL 30 MG PO CPEP
30.0000 mg | ORAL_CAPSULE | Freq: Two times a day (BID) | ORAL | Status: DC
  Administered 2022-08-24 – 2022-08-31 (×15): 30 mg via ORAL
  Filled 2022-08-23 (×15): qty 1

## 2022-08-23 MED ORDER — GABAPENTIN 400 MG PO CAPS
400.0000 mg | ORAL_CAPSULE | Freq: Two times a day (BID) | ORAL | Status: DC
  Administered 2022-08-24 – 2022-08-31 (×15): 400 mg via ORAL
  Filled 2022-08-23 (×16): qty 1

## 2022-08-23 MED ORDER — OMEGA-3-ACID ETHYL ESTERS 1 G PO CAPS
1.0000 g | ORAL_CAPSULE | Freq: Every day | ORAL | Status: DC
  Administered 2022-08-24 – 2022-08-31 (×3): 1 g via ORAL
  Filled 2022-08-23 (×6): qty 1

## 2022-08-23 MED ORDER — RISAQUAD PO CAPS
1.0000 | ORAL_CAPSULE | Freq: Every day | ORAL | Status: DC
  Administered 2022-08-24 – 2022-08-31 (×8): 1 via ORAL
  Filled 2022-08-23 (×8): qty 1

## 2022-08-23 MED ORDER — POLYVINYL ALCOHOL 1.4 % OP SOLN: 1.0000 [drp] | Freq: Every day | OPHTHALMIC | Status: DC | PRN

## 2022-08-23 MED ORDER — ONDANSETRON HCL 4 MG PO TABS: 4.0000 mg | ORAL_TABLET | Freq: Four times a day (QID) | ORAL | Status: DC | PRN

## 2022-08-23 MED ORDER — TRAZODONE HCL 50 MG PO TABS: 25.0000 mg | ORAL_TABLET | Freq: Every evening | ORAL | Status: DC | PRN

## 2022-08-23 MED ORDER — POTASSIUM CHLORIDE 20 MEQ PO PACK
40.0000 meq | PACK | Freq: Once | ORAL | Status: AC
  Administered 2022-08-24: 40 meq via ORAL
  Filled 2022-08-23: qty 2

## 2022-08-23 MED ORDER — ONDANSETRON HCL 4 MG/2ML IJ SOLN: 4.0000 mg | Freq: Four times a day (QID) | INTRAMUSCULAR | Status: DC | PRN

## 2022-08-23 NOTE — ED Provider Triage Note (Signed)
Emergency Medicine Provider Triage Evaluation Note  ZAEDA MCFERRAN, a 82 y.o. female  was evaluated in triage.  Pt complains of injuries related to a mechanical fall.  She tripped and fell in a parking lot, and is endorsing pain to the right hip, also noting some right neck pain as well as pain to the bilateral knees patient denies any head injury or LOC.  Review of Systems  Positive: Fall, right hip pain Negative: LOC  Physical Exam  BP (!) 163/67 (BP Location: Left Arm)   Pulse 68   Temp 97.6 F (36.4 C) (Oral)   Resp 17   Ht '5\' 7"'$  (1.702 m)   Wt 67 kg   LMP 08/14/1989   SpO2 93%   BMI 23.13 kg/m  Gen:   Awake, no distress  NAD Resp:  Normal effort  MSK:   Moves extremities without difficulty. Palpable right hip hematoma Other:    Medical Decision Making  Medically screening exam initiated at 4:47 PM.  Appropriate orders placed.  QUINLYNN CUTHBERT was informed that the remainder of the evaluation will be completed by another provider, this initial triage assessment does not replace that evaluation, and the importance of remaining in the ED until their evaluation is complete.   Geriatric patient to the ED for evaluation of injury sustained following a conical fall.  She endorses pain to the right hip, low back which is chronic, bilateral knees as well as the neck.    Melvenia Needles, PA-C 08/23/22 1649

## 2022-08-23 NOTE — ED Notes (Signed)
Patient's oxygen saturations noted to be decreasing and staying around 88%.  Good wave form noted on monitor.  Patient placed on 1 L [place] with improvement.

## 2022-08-23 NOTE — H&P (Incomplete)
[place]   PATIENT NAME: Kristin [name]    MR#:  [id number]  DATE OF BIRTH:  22-Jul-1941  DATE OF ADMISSION:  08/23/2022  PRIMARY CARE PHYSICIAN: Kristin Fenton, NP   Patient is coming from: Home  REQUESTING/REFERRING PHYSICIAN: Cuthriell, Leola [name]  CHIEF COMPLAINT:   Chief Complaint  Patient presents with   Fall   Hip Pain    HISTORY OF PRESENT ILLNESS:  Kristin [name] is a 82 y.o.Caucasian female with medical history significant for Coronary artery disease, fibromyalgia, DVT, [name], osteoarthritis, rheumatoid arthritis, remote subarachnoid hemorrhage due to ruptured aneurysm, who presented to the emergency room with acute onset of accidental mechanical fall with subsequent right hip pain and inability to ambulate.  .The patient was apparently walking with her husband and became confused under the direction and tripped and fell.  It was apparently a slow fall without significant impact.  She was able to stand and walk after this injury.  She has been having worsening pain however since her fall and was taken to urgent care by her husband.  She was then referred to the emergency room due to lack imaging capability.  No fever or chills.  No nausea or vomiting or abdominal pain.  No chest pain or dyspnea or cough or wheezing.  No presyncope or syncope.  No dysuria, oliguria or hematuria or flank pain.  ED Course: Upon presentation to the emergency room, BP was 163/67 with otherwise normal vital signs. BMP revealed hypokalemia of 3.1 and a creatinine 1.17 close to baseline and CBC showed no leukocytosis of 11.8.  Influenza antigens and COVID-19 PCR as well as RSV PCR came back negative. EKG as reviewed by me : EKG showed normal sinus rhythm with a rate of 60. Imaging: 2 view chest x-ray showed blunting of the left lateral CP angle that may be due to minimal pleural effusion or pleural thickening with small linear densities in the lateral aspect of the left lower lung fields  that may suggest scarring or subsegmental atelectasis.  The patient was given 4 mg of IV morphine sulfate and 1 p.o. Percocet.  She will be admitted to a medical observation bed for further evaluation and management. PAST MEDICAL HISTORY:   Past Medical History:  Diagnosis Date   Allergy    Arthritis    recent falls -aug 2010   [name] (coronary artery disease)    [id number]   Complication of anesthesia    pt states takes a long time to wake her wake up   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 ([place]) 08/27/2015   Cough    no fever    Degeneration of lumbar or lumbosacral intervertebral disc    Dizziness    r/t meds   DVT (deep venous thrombosis) (HCC)    Fibromyalgia    History of shingles    [name] (irritable bowel syndrome)    takes OTC Hardin Negus colon health   Joint pain    Joint swelling    Myalgia and myositis, unspecified    Obstructive sleep apnea (adult) (pediatric)    Old myocardial infarction 1996   Osteoarthritis    Osteoarthrosis, unspecified whether generalized or localized, unspecified site    Psoriatic arthritis ([place])    Rheumatoid arthritis ([place])    Rosacea    Seasonal allergies    Stroke [facility])    ischaemic microvascular disease   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[place])  1991 , bleed and dizziness    Unspecified essential hypertension    takes Diltiazem,Metoprolol,and Losartan daily   UTI (lower urinary tract infection)    frequent=last one was winter of 2014    PAST SURGICAL HISTORY:   Past Surgical History:  Procedure Laterality Date   [place]   vasospams; no blockage   CARDIAC CATHETERIZATION  2013   No obstructive coronary artery disease. Mild distal LAD and proximal RCA disease.   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 ;  Surgeon: Izora Gala, MD;  Location: [place];  Service: ENT;  Laterality: N/A;   NASAL SINUS SURGERY  1998   OOPHORECTOMY     BSO   sinus biopsy  1998   inverted [place]   for mennorhagia-[facility] BSO    SOCIAL HISTORY:   Social History   Tobacco Use   Smoking status: Never   Smokeless tobacco: Never  Substance Use Topics   Alcohol use: No    Alcohol/week: 0.0 standard drinks of alcohol    Comment: Rare    FAMILY HISTORY:   Family History  Problem Relation Age of Onset   Colon cancer Father    Heart attack Mother    Coronary artery disease Mother    Osteoporosis Mother    Heart disease Mother    Heart failure Mother    GER disease Sister    Skin cancer Sister    Hypertension Sister    Osteopenia Sister    GER disease Sister    Breast cancer Paternal Aunt        age 66   Heart failure Maternal Grandmother    Heart failure Maternal Grandfather     DRUG ALLERGIES:   Allergies  Allergen Reactions   Angiotensin Receptor Blockers Anaphylaxis    Fatigue & Dizziness   Other Anaphylaxis    Other reaction(s): Other (See Comments) Dizziness, rash Fatigue & Dizziness   Hydralazine Other (See Comments)    Unknown   Latex     REACTION: Rash, blisters, breathing probs.   Losartan     Dizziness    Methotrexate Derivatives     Dizziness, rash    Spironolactone     Dizziness    Penicillins Hives and Rash    REVIEW OF SYSTEMS:   ROS As per history of present illness. All pertinent systems were reviewed above. Constitutional, HEENT, cardiovascular, respiratory, GI, GU, musculoskeletal, neuro, psychiatric, endocrine, integumentary and hematologic systems were reviewed and are otherwise negative/unremarkable except for positive findings mentioned above in the HPI.   MEDICATIONS AT  HOME:   Prior to Admission medications   Medication Sig Start Date End Date Taking? Authorizing Provider  acetaminophen (TYLENOL) 325 MG tablet Take 650 mg by mouth every 8 (eight) hours as needed (pain).    [provider]  ASPIRIN 81 PO Take 2 tablets by mouth daily.    [provider]  Calcium Carbonate-Vitamin D (CALTRATE 600+D PO) Take 1 tablet by mouth daily at 12 noon.    [provider]  Cranberry 400 MG TABS Take 1 tablet by mouth daily.    [provider]  diclofenac Sodium (VOLTAREN) 1 % GEL Apply 2 g topically 4 (four) times daily as needed.    [provider]  diltiazem (CARDIZEM CD) 240 MG 24 hr capsule TAKE 1 CAPSULE BY MOUTH DAILY 07/03/22   Wellington Hampshire, MD  donepezil (ARICEPT) 10 MG tablet Take 10 mg by mouth at bedtime. 11/02/20 01/26/23  [provider]  DULoxetine (CYMBALTA) 20 MG capsule Take 1 capsule by mouth 2 (two) times daily. 02/07/21 06/06/23  [provider]  gabapentin (NEURONTIN) 400 MG capsule Take 400 mg by mouth 2 (two) times daily. 01/23/22   [provider]  Lactobacillus Rhamnosus, GG, (CULTURELLE PO) Take 1 capsule by mouth daily.    [provider]  memantine (NAMENDA) 10 MG tablet TAKE 1 TABLET BY MOUTH TWICE DAILY 03/28/21   Kristin Fenton, NP  Multiple Vitamin (MULTIVITAMIN) tablet Take 1 tablet by mouth daily.    [provider]  Omega-3 Fatty Acids (FISH OIL) 1200 MG CAPS Take 1 capsule by mouth daily.     [provider]  Polyethyl Glycol-Propyl Glycol 0.4-0.3 % SOLN Apply 1 drop to eye daily as needed (dry eyes).    [provider]  Red Yeast Rice 600 MG CAPS Take 2 capsules by mouth daily.    [provider]  gabapentin (NEURONTIN) 100 MG capsule Take 200 mg by mouth 2 (two) times daily. 04/27/21   [provider]      VITAL SIGNS:  Blood pressure (!) 165/72, pulse 72, temperature 98.9 F (37.2 C), resp. rate 16, height  '5\' 7"'$  (1.702 m), weight 67 kg, last menstrual period 08/14/1989, SpO2 98 %.  PHYSICAL EXAMINATION:  Physical Exam  GENERAL:  82 y.o.-year-old Caucasian female patient lying in the bed with no acute distress.  EYES: Pupils equal, round, reactive to light and accommodation. No scleral icterus. Extraocular muscles intact.  HEENT: Head atraumatic, normocephalic. Oropharynx and nasopharynx clear.  NECK:  Supple, no jugular venous distention. No thyroid enlargement, no tenderness.  LUNGS: Normal breath sounds bilaterally, no wheezing, rales,rhonchi or crepitation. No use of accessory muscles of respiration.  CARDIOVASCULAR: Regular rate and rhythm, S1, S2 normal. No murmurs, rubs, or gallops.  ABDOMEN: Soft, nondistended, nontender. Bowel sounds present. No organomegaly or mass.  EXTREMITIES: No pedal edema, cyanosis, or clubbing.  Musculoskeletal: Right medial hip tenderness. NEUROLOGIC: Cranial nerves II through XII are intact. Muscle strength 5/5 in all extremities. Sensation intact. Gait not checked.  PSYCHIATRIC: The patient is alert and oriented x 3.  Normal affect and good eye contact. SKIN: No obvious rash, lesion, or ulcer.   LABORATORY PANEL:   CBC Recent Labs  Lab 08/23/22 1633  WBC 11.8*  HGB 12.9  HCT 39.3  PLT 243   ------------------------------------------------------------------------------------------------------------------  Chemistries  Recent Labs  Lab 08/23/22 1633  NA 139  K 3.1*  CL 99  CO2 27  GLUCOSE 110*  BUN 21  CREATININE 1.17*  CALCIUM 9.2   ------------------------------------------------------------------------------------------------------------------  Cardiac Enzymes No results for input(s): "TROPONINI" in the last 168 hours. ------------------------------------------------------------------------------------------------------------------  RADIOLOGY:  CT Cervical Spine Wo Contrast  Result Date: 08/23/2022 CLINICAL DATA:  Trauma, fall  EXAM: CT CERVICAL SPINE WITHOUT CONTRAST TECHNIQUE: Multidetector CT imaging of the cervical spine was performed without intravenous contrast. Multiplanar CT image reconstructions were also generated. RADIATION DOSE REDUCTION: This exam was performed according to the departmental dose-optimization program which includes automated exposure control, adjustment of the mA and/or kV according to patient size and/or use of iterative  reconstruction technique. COMPARISON:  None Available. FINDINGS: Alignment: No recent fracture is seen. There is minimal anterolisthesis at C4-C5 level. Skull base and vertebrae: Degenerative changes are noted with disc space narrowing and bony spurs at multiple levels, more so at C5-C6 and C6-C7 levels. There is partial fusion of C2 and C3 vertebrae. Anterior arch of C1 is smaller than usual. There is possible fusion of anterior arch of C1 and tip of odontoid process. Soft tissues and spinal canal: Prevertebral soft tissues are unremarkable. Posterior bony spurs are causing extrinsic pressure over the ventral margin of thecal sac at C5-C6 and C6-C7 levels. Disc levels: There is encroachment of neural foramina from C3-C7 levels. Upper chest: Pleural thickening in both apices may suggest scarring. Other: There is 1 cm low-density nodule in the right lobe of thyroid no follow-up imaging is recommended. IMPRESSION: No recent fracture is seen cervical spine. Cervical spondylosis with encroachment of neural foramina from C3-C7 levels. Other findings as described in the body of the report. Electronically Signed   By: Elmer Picker M.D.   On: 08/23/2022 18:14   CT HEAD WO CONTRAST (5MM)  Result Date: 08/23/2022 CLINICAL DATA:  Trauma, fall EXAM: CT HEAD WITHOUT CONTRAST TECHNIQUE: Contiguous axial images were obtained from the base of the skull through the vertex without intravenous contrast. RADIATION DOSE REDUCTION: This exam was performed according to the departmental dose-optimization  program which includes automated exposure control, adjustment of the mA and/or kV according to patient size and/or use of iterative reconstruction technique. COMPARISON:  06/18/2017 FINDINGS: Brain: No acute intracranial findings are seen. There are no signs of bleeding within the cranium. Cortical sulci are prominent. There is decreased density in periventricular and subcortical white matter. Vascular: Scattered arterial calcifications are seen. Skull: No fracture is seen in calvarium. Sinuses/Orbits: There is mucosal thickening in ethmoid sinus. There is defect in the medial wall of left maxillary sinus, possibly related to previous surgery. Other: None. IMPRESSION: No acute intracranial findings are seen in noncontrast CT brain. Atrophy. Small-vessel disease. Electronically Signed   By: Elmer Picker M.D.   On: 08/23/2022 18:06   DG Lumbar Spine Complete  Result Date: 08/23/2022 CLINICAL DATA:  Trauma, fall EXAM: LUMBAR SPINE - COMPLETE 4+ VIEW COMPARISON:  01/15/2009 FINDINGS: No recent fracture is seen. Degenerative changes are noted with disc space narrowing, bony spurs and facet hypertrophy at multiple levels. There is mild decrease in height of left side of L5 vertebral body without definite demonstrable break in the cortical margins. There is mild levoscoliosis. Scattered vascular calcifications are seen. IMPRESSION: No recent displaced fracture or dislocation is seen. There is mild decrease in height of body of L5 vertebra which may be residual change from previous injury and degenerative arthritis. Lumbar spondylosis. Electronically Signed   By: Elmer Picker M.D.   On: 08/23/2022 18:01   DG Knee Complete 4 Views Left  Result Date: 08/23/2022 CLINICAL DATA:  Trauma, fall EXAM: LEFT KNEE - COMPLETE 4+ VIEW COMPARISON:  None Available. FINDINGS: No recent displaced fracture or dislocation is seen. In the AP view, there is faint linear lucency in the upper lateral aspect of patella. This  finding could not be seen in the rest of the images. There are few smooth marginated calcifications adjacent to the upper margin of patella. Degenerative changes are noted with bony spurs and chondrocalcinosis. There is no significant effusion. Arterial calcifications are seen in soft tissues. IMPRESSION: No displaced fracture or dislocation is seen. There is linear lucency in the upper  lateral aspect of patella which may suggest recent or old undisplaced fracture. Calcifications seen at the attachment of quadriceps tendon to the patella may suggest calcific tendinosis. Electronically Signed   By: Elmer Picker M.D.   On: 08/23/2022 17:57   DG Knee Complete 4 Views Right  Result Date: 08/23/2022 CLINICAL DATA:  Trauma, fall EXAM: RIGHT KNEE - COMPLETE 4+ VIEW COMPARISON:  None Available. FINDINGS: No recent fracture or dislocation is seen. There is no significant effusion in suprapatellar bursa. There are scattered arterial calcifications in soft tissues. There are few smooth marginated calcifications adjacent to the upper margin of patella. Small bony spurs seen in medial, lateral and patellofemoral compartments. IMPRESSION: No recent fracture or dislocation is seen. Degenerative changes are noted with small bony spurs. Small smooth marginated calcifications adjacent to the upper margin of patella may suggest calcific bursitis or tendinosis. Electronically Signed   By: Elmer Picker M.D.   On: 08/23/2022 17:54   DG Hip Unilat W or Wo Pelvis 2-3 Views Right  Result Date: 08/23/2022 CLINICAL DATA:  Trauma, fall EXAM: DG HIP (WITH OR WITHOUT PELVIS) 2-3V RIGHT COMPARISON:  None Available. FINDINGS: No displaced fracture or dislocation is seen. There is cortical irregularity at the junction right inferior pubic ramus and ischium. There is minimal cortical irregularity in the upper cortical margin of body of right pubic bone. Degenerative changes are noted in the lower lumbar spine. Scattered  arterial calcifications are seen. IMPRESSION: Cortical irregularity is seen in the upper margin of right pubic bone and at the junction of right inferior pubic ramus and ischium suggesting possible undisplaced recent or old fractures. No definite fracture is seen in proximal right femur. Arteriosclerosis.  Lumbar spondylosis. Electronically Signed   By: Elmer Picker M.D.   On: 08/23/2022 17:52   DG Chest 2 View  Result Date: 08/23/2022 CLINICAL DATA:  Trauma, fall EXAM: CHEST - 2 VIEW COMPARISON:  06/18/2017 FINDINGS: Cardiac size is within normal limits. There are no signs of pulmonary edema or focal consolidation. Small linear densities are seen in lateral aspect of left lower lung field. There is blunting of left lateral CP angle. There is no pneumothorax. IMPRESSION: There are no signs of pulmonary edema or focal consolidation. Blunting of left lateral CP angle may be due to minimal effusion or pleural thickening. Small linear densities in the lateral aspect of left lower lung fields may suggest minimal scarring or subsegmental atelectasis. Electronically Signed   By: Elmer Picker M.D.   On: 08/23/2022 17:48      IMPRESSION AND PLAN:  Assessment and Plan: * Pelvic ring fracture ([place]) - The patient has a right pubic ramus fracture secondary to mechanical fall. - Pain management will be provided. - PT consult will be obtained.    Hypokalemia - We will replace potassium and check magnesium level.  Essential hypertension - We will continue her antihypertensives.  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  Peripheral neuropathy - We will continue Neurontin.   DVT prophylaxis: Lovenox.  Advanced Care Planning:  Code Status: full code.  Family Communication:  The plan of care was discussed in details with the patient (and family). I answered all questions. The patient agreed to proceed with the above mentioned plan.  Further management will depend upon hospital course. Disposition Plan: Back to previous home environment Consults called: none.  All the records are reviewed and case discussed with ED provider.  Status is: Observation  I certify that  at the time of admission, it is my clinical judgment that the patient will require hospital care extending less than 2 midnights.                            Dispo: The patient is from: Home              Anticipated d/c is to: Home              Patient currently is not medically stable to d/c.              Difficult to place patient: No  Christel Mormon M.D on 08/24/2022 at 2:24 AM  [facility]   From 7 PM-7 AM, contact night-coverage [email or web address]  CC: Primary care physician; Kristin Fenton, NP

## 2022-08-23 NOTE — ED Provider Notes (Signed)
[facility] Provider Note  Patient Contact: 5:41 PM (approximate)   History   Fall and Hip Pain   HPI  Kristin [name] is a 82 y.o. female who presents the emergency department complaining of hip pain.  Patient took a mechanical fall was a gradual fall onto the ground.  Patient was walking with her husband, became confused on the direction of travel and tripped and fell to the ground.  Again this was a slow fall without significant impact.  Patient was able to stand and walk after this injury.  When they arrived home patient was having increased pain and so the patient was taken to urgent care by her husband.  Urgent care did not have complete imaging capability and referred him to the emergency department.  Patient is complaining of hip pain at this time.  No other complaints currently.  Patient is resting comfortably.     Physical Exam   Triage Vital Signs: ED Triage Vitals  Enc Vitals Group     BP 08/23/22 1632 (!) 163/67     Pulse Rate 08/23/22 1632 68     Resp 08/23/22 1632 17     Temp 08/23/22 1632 97.6 F (36.4 C)     Temp Source 08/23/22 1632 Oral     SpO2 08/23/22 1632 93 %     Weight 08/23/22 1633 147 lb 14.9 oz (67.1 kg)     Height 08/23/22 1633 '5\' 7"'$  (1.702 m)     Head Circumference --      Peak Flow --      Pain Score 08/23/22 1636 10     Pain Loc --      Pain Edu? --      Excl. in [place]? --     Most recent vital signs: Vitals:   08/23/22 2030 08/23/22 2130  BP: (!) 203/90 (!) 186/80  Pulse: 79 82  Resp: 18 18  Temp:    SpO2: 95% 98%     General: Alert and in no acute distress. Head: No acute traumatic findings   Cardiovascular:  Good peripheral perfusion Respiratory: Normal respiratory effort without tachypnea or retractions. Lungs CTAB. Good air entry to the bases with no decreased or absent breath sounds Musculoskeletal: Full range of motion to all extremities.  Neurologic:  No gross focal neurologic deficits are  appreciated.  Skin:   No rash noted Other:   ED Results / Procedures / Treatments   Labs (all labs ordered are listed, but only abnormal results are displayed) Labs Reviewed  BASIC METABOLIC PANEL - Abnormal; Notable for the following components:      Result Value   Potassium 3.1 (*)    Glucose, Bld 110 (*)    Creatinine, Ser 1.17 (*)    GFR, Estimated 47 (*)    All other components within normal limits  CBC - Abnormal; Notable for the following components:   WBC 11.8 (*)    All other components within normal limits  RESP PANEL BY RT-PCR (RSV, FLU A&B, COVID)  [id number]  TROPONIN I (HIGH SENSITIVITY)  TROPONIN I (HIGH SENSITIVITY)     EKG  ED ECG REPORT I, Charline Bills Lariah Fleer,  personally viewed and interpreted this ECG.   Date: 08/23/2022  EKG Time: 1629 hrs.  Rate: 60 bpm  Rhythm: unchanged from previous tracings, normal sinus rhythm  Axis: Normal axis  Intervals:none  ST&T Change: No ST elevation or depression noted.  , Sinus rhythm.  No STEMI.  No significant  change from previous EKG from 09/02/2021    RADIOLOGY  I personally viewed, evaluated, and interpreted these images as part of my medical decision making, as well as reviewing the written report by the radiologist.  ED Provider Interpretation: No acute traumatic findings on CT scan of the head or cervical spine.  Specifically no skull fracture, spinal fracture, intracranial hemorrhage.  X-rays of the lumbar spine, bilateral knees, hip is reviewed.  X-rays of the lumbar spine, bilateral knees reveal chronic changes.  X-ray of the hip reveals what appears to be an acute pubic rami fracture.  CT Cervical Spine Wo Contrast  Result Date: 08/23/2022 CLINICAL DATA:  Trauma, fall EXAM: CT CERVICAL SPINE WITHOUT CONTRAST TECHNIQUE: Multidetector CT imaging of the cervical spine was performed without intravenous contrast. Multiplanar CT image reconstructions were also generated. RADIATION DOSE REDUCTION: This exam was  performed according to the departmental dose-optimization program which includes automated exposure control, adjustment of the mA and/or kV according to patient size and/or use of iterative reconstruction technique. COMPARISON:  None Available. FINDINGS: Alignment: No recent fracture is seen. There is minimal anterolisthesis at C4-C5 level. Skull base and vertebrae: Degenerative changes are noted with disc space narrowing and bony spurs at multiple levels, more so at C5-C6 and C6-C7 levels. There is partial fusion of C2 and C3 vertebrae. Anterior arch of C1 is smaller than usual. There is possible fusion of anterior arch of C1 and tip of odontoid process. Soft tissues and spinal canal: Prevertebral soft tissues are unremarkable. Posterior bony spurs are causing extrinsic pressure over the ventral margin of thecal sac at C5-C6 and C6-C7 levels. Disc levels: There is encroachment of neural foramina from C3-C7 levels. Upper chest: Pleural thickening in both apices may suggest scarring. Other: There is 1 cm low-density nodule in the right lobe of thyroid no follow-up imaging is recommended. IMPRESSION: No recent fracture is seen cervical spine. Cervical spondylosis with encroachment of neural foramina from C3-C7 levels. Other findings as described in the body of the report. Electronically Signed   By: Elmer Picker M.D.   On: 08/23/2022 18:14   CT HEAD WO CONTRAST (5MM)  Result Date: 08/23/2022 CLINICAL DATA:  Trauma, fall EXAM: CT HEAD WITHOUT CONTRAST TECHNIQUE: Contiguous axial images were obtained from the base of the skull through the vertex without intravenous contrast. RADIATION DOSE REDUCTION: This exam was performed according to the departmental dose-optimization program which includes automated exposure control, adjustment of the mA and/or kV according to patient size and/or use of iterative reconstruction technique. COMPARISON:  06/18/2017 FINDINGS: Brain: No acute intracranial findings are seen.  There are no signs of bleeding within the cranium. Cortical sulci are prominent. There is decreased density in periventricular and subcortical white matter. Vascular: Scattered arterial calcifications are seen. Skull: No fracture is seen in calvarium. Sinuses/Orbits: There is mucosal thickening in ethmoid sinus. There is defect in the medial wall of left maxillary sinus, possibly related to previous surgery. Other: None. IMPRESSION: No acute intracranial findings are seen in noncontrast CT brain. Atrophy. Small-vessel disease. Electronically Signed   By: Elmer Picker M.D.   On: 08/23/2022 18:06   DG Lumbar Spine Complete  Result Date: 08/23/2022 CLINICAL DATA:  Trauma, fall EXAM: LUMBAR SPINE - COMPLETE 4+ VIEW COMPARISON:  01/15/2009 FINDINGS: No recent fracture is seen. Degenerative changes are noted with disc space narrowing, bony spurs and facet hypertrophy at multiple levels. There is mild decrease in height of left side of L5 vertebral body without definite demonstrable break in the  cortical margins. There is mild levoscoliosis. Scattered vascular calcifications are seen. IMPRESSION: No recent displaced fracture or dislocation is seen. There is mild decrease in height of body of L5 vertebra which may be residual change from previous injury and degenerative arthritis. Lumbar spondylosis. Electronically Signed   By: Elmer Picker M.D.   On: 08/23/2022 18:01   DG Knee Complete 4 Views Left  Result Date: 08/23/2022 CLINICAL DATA:  Trauma, fall EXAM: LEFT KNEE - COMPLETE 4+ VIEW COMPARISON:  None Available. FINDINGS: No recent displaced fracture or dislocation is seen. In the AP view, there is faint linear lucency in the upper lateral aspect of patella. This finding could not be seen in the rest of the images. There are few smooth marginated calcifications adjacent to the upper margin of patella. Degenerative changes are noted with bony spurs and chondrocalcinosis. There is no significant  effusion. Arterial calcifications are seen in soft tissues. IMPRESSION: No displaced fracture or dislocation is seen. There is linear lucency in the upper lateral aspect of patella which may suggest recent or old undisplaced fracture. Calcifications seen at the attachment of quadriceps tendon to the patella may suggest calcific tendinosis. Electronically Signed   By: Elmer Picker M.D.   On: 08/23/2022 17:57   DG Knee Complete 4 Views Right  Result Date: 08/23/2022 CLINICAL DATA:  Trauma, fall EXAM: RIGHT KNEE - COMPLETE 4+ VIEW COMPARISON:  None Available. FINDINGS: No recent fracture or dislocation is seen. There is no significant effusion in suprapatellar bursa. There are scattered arterial calcifications in soft tissues. There are few smooth marginated calcifications adjacent to the upper margin of patella. Small bony spurs seen in medial, lateral and patellofemoral compartments. IMPRESSION: No recent fracture or dislocation is seen. Degenerative changes are noted with small bony spurs. Small smooth marginated calcifications adjacent to the upper margin of patella may suggest calcific bursitis or tendinosis. Electronically Signed   By: Elmer Picker M.D.   On: 08/23/2022 17:54   DG Hip Unilat W or Wo Pelvis 2-3 Views Right  Result Date: 08/23/2022 CLINICAL DATA:  Trauma, fall EXAM: DG HIP (WITH OR WITHOUT PELVIS) 2-3V RIGHT COMPARISON:  None Available. FINDINGS: No displaced fracture or dislocation is seen. There is cortical irregularity at the junction right inferior pubic ramus and ischium. There is minimal cortical irregularity in the upper cortical margin of body of right pubic bone. Degenerative changes are noted in the lower lumbar spine. Scattered arterial calcifications are seen. IMPRESSION: Cortical irregularity is seen in the upper margin of right pubic bone and at the junction of right inferior pubic ramus and ischium suggesting possible undisplaced recent or old fractures. No  definite fracture is seen in proximal right femur. Arteriosclerosis.  Lumbar spondylosis. Electronically Signed   By: Elmer Picker M.D.   On: 08/23/2022 17:52   DG Chest 2 View  Result Date: 08/23/2022 CLINICAL DATA:  Trauma, fall EXAM: CHEST - 2 VIEW COMPARISON:  06/18/2017 FINDINGS: Cardiac size is within normal limits. There are no signs of pulmonary edema or focal consolidation. Small linear densities are seen in lateral aspect of left lower lung field. There is blunting of left lateral CP angle. There is no pneumothorax. IMPRESSION: There are no signs of pulmonary edema or focal consolidation. Blunting of left lateral CP angle may be due to minimal effusion or pleural thickening. Small linear densities in the lateral aspect of left lower lung fields may suggest minimal scarring or subsegmental atelectasis. Electronically Signed   By: Prudy Feeler.D.  On: 08/23/2022 17:48    PROCEDURES:  Critical Care performed: No  Procedures   MEDICATIONS ORDERED IN ED: Medications  morphine (PF) 4 MG/ML injection 4 mg (has no administration in time range)  oxyCODONE-acetaminophen (PERCOCET/ROXICET) 5-325 MG per tablet 1 tablet (1 tablet Oral Given 08/23/22 1931)     IMPRESSION / MDM / ASSESSMENT AND PLAN / ED COURSE  I reviewed the triage vital signs and the nursing notes.                                 Differential diagnosis includes, but is not limited to, fall, skull fracture, intracranial hemorrhage, hip fracture, pelvic fracture, lumbar spine compression fracture, knee fractures, contusions  The patient is on the cardiac monitor to evaluate for evidence of arrhythmia and/or significant heart rate changes.  Patient's presentation is most consistent with acute presentation with potential threat to life or bodily function.   Patient's diagnosis is consistent with fall, pubic rami fracture.  Patient presents emergency department with a low mechanism fall.  Patient was  walking, became confused on direction of travel and fell.  She was assisted to the ground by her husband but landed on the right side.  Patient has chronic back issues.  Is largely reliant on her husband for ambulation purposes.  Patient was having increased pain in her hip after this fall but she was able to ambulate.  Patient first went to urgent care and then was referred to the emergency department.  Labs, imaging are overall reassuring.  CT scans of the head, cervical spine, x-ray of the lumbar spine, bilateral knees are reassuring.  Hip x-ray reveals no evidence of hip fracture but does reveal a pubic rami fracture.  I discussed the results at length with the husband.  Patient is largely sedentary at this time with very minimal ambulation.  I discussed admission for pain control and rehab placement versus discharge home and treatment at home.  Husband opts to go home at this time.  I have advised the patient's husband that she should largely be nonweightbearing on this hip.  If he has difficulty managing her care at home he can always return to the emergency department or follow-up with primary care for rehab placement.  Patient has been verbalized the same.    Patient was unable to sit up due to pain.  She also became hypoxic during our treatment for pain.  At this time do not feel that patient will likely be to go home.  Will reach out to the hospitalist for admission.     FINAL CLINICAL IMPRESSION(S) / ED DIAGNOSES   Final diagnoses:  Fall, initial encounter  Closed fracture of ramus of right pubis, initial encounter ([place])  Uncontrolled pain     Rx / DC Orders   ED Discharge Orders     None        Note:  This document was prepared using Dragon voice recognition software and may include unintentional dictation errors.   Brynda Peon 08/23/22 2143    Lucillie Garfinkel, MD 08/24/22 2330

## 2022-08-23 NOTE — ED Triage Notes (Signed)
Pt has right hip pain.  Pt fell in a parking lot.   Pt also reports neck pain.  Pt reports chest pain also.  No loc.  No headache.  Pt alert.

## 2022-08-23 NOTE — Progress Notes (Signed)
PHARMACIST - PHYSICIAN ORDER COMMUNICATION  CONCERNING: P&T Medication Policy on Herbal Medications  DESCRIPTION:  This patient's order(s) for: Cranberry tabs 400 mg & Red Yeast Rice Caps 1200 mg has been noted.  This product(s) is classified as an "herbal" or natural product. Due to a lack of definitive safety studies or FDA approval, nonstandard manufacturing practices, plus the potential risk of unknown drug-drug interactions while on inpatient medications, the Pharmacy and Therapeutics Committee does not permit the use of "herbal" or natural products of this type within [facility].   ACTION TAKEN: The pharmacy department is unable to verify this order at this time.  Please reevaluate patient's clinical condition at discharge and address if the herbal or natural product(s) should be resumed at that time.  Renda Rolls, PharmD, [facility] 08/23/2022 10:37 PM

## 2022-08-24 DIAGNOSIS — R339 Retention of urine, unspecified: Secondary | ICD-10-CM | POA: Diagnosis not present

## 2022-08-24 DIAGNOSIS — S32810A Multiple fractures of pelvis with stable disruption of pelvic ring, initial encounter for closed fracture: Secondary | ICD-10-CM | POA: Diagnosis present

## 2022-08-24 DIAGNOSIS — R54 Age-related physical debility: Secondary | ICD-10-CM | POA: Diagnosis present

## 2022-08-24 DIAGNOSIS — I252 Old myocardial infarction: Secondary | ICD-10-CM | POA: Diagnosis not present

## 2022-08-24 DIAGNOSIS — M797 Fibromyalgia: Secondary | ICD-10-CM | POA: Diagnosis present

## 2022-08-24 DIAGNOSIS — I2510 Atherosclerotic heart disease of native coronary artery without angina pectoris: Secondary | ICD-10-CM | POA: Diagnosis present

## 2022-08-24 DIAGNOSIS — I672 Cerebral atherosclerosis: Secondary | ICD-10-CM | POA: Diagnosis present

## 2022-08-24 DIAGNOSIS — G4733 Obstructive sleep apnea (adult) (pediatric): Secondary | ICD-10-CM | POA: Diagnosis present

## 2022-08-24 DIAGNOSIS — G629 Polyneuropathy, unspecified: Secondary | ICD-10-CM

## 2022-08-24 DIAGNOSIS — I10 Essential (primary) hypertension: Secondary | ICD-10-CM | POA: Diagnosis present

## 2022-08-24 DIAGNOSIS — Z800 Family history of malignant neoplasm of digestive organs: Secondary | ICD-10-CM | POA: Diagnosis not present

## 2022-08-24 DIAGNOSIS — M1990 Unspecified osteoarthritis, unspecified site: Secondary | ICD-10-CM | POA: Diagnosis present

## 2022-08-24 DIAGNOSIS — Z8249 Family history of ischemic heart disease and other diseases of the circulatory system: Secondary | ICD-10-CM | POA: Diagnosis not present

## 2022-08-24 DIAGNOSIS — Z8673 Personal history of transient ischemic attack (TIA), and cerebral infarction without residual deficits: Secondary | ICD-10-CM | POA: Diagnosis not present

## 2022-08-24 DIAGNOSIS — Z7189 Other specified counseling: Secondary | ICD-10-CM | POA: Diagnosis not present

## 2022-08-24 DIAGNOSIS — S32810D Multiple fractures of pelvis with stable disruption of pelvic ring, subsequent encounter for fracture with routine healing: Secondary | ICD-10-CM | POA: Diagnosis not present

## 2022-08-24 DIAGNOSIS — Z1152 Encounter for screening for COVID-19: Secondary | ICD-10-CM | POA: Diagnosis not present

## 2022-08-24 DIAGNOSIS — Z66 Do not resuscitate: Secondary | ICD-10-CM | POA: Diagnosis not present

## 2022-08-24 DIAGNOSIS — S32591A Other specified fracture of right pubis, initial encounter for closed fracture: Secondary | ICD-10-CM | POA: Insufficient documentation

## 2022-08-24 DIAGNOSIS — F0390 Unspecified dementia without behavioral disturbance: Secondary | ICD-10-CM | POA: Diagnosis not present

## 2022-08-24 DIAGNOSIS — Z8262 Family history of osteoporosis: Secondary | ICD-10-CM | POA: Diagnosis not present

## 2022-08-24 DIAGNOSIS — L4050 Arthropathic psoriasis, unspecified: Secondary | ICD-10-CM | POA: Diagnosis present

## 2022-08-24 DIAGNOSIS — Z808 Family history of malignant neoplasm of other organs or systems: Secondary | ICD-10-CM | POA: Diagnosis not present

## 2022-08-24 DIAGNOSIS — Z803 Family history of malignant neoplasm of breast: Secondary | ICD-10-CM | POA: Diagnosis not present

## 2022-08-24 DIAGNOSIS — F0153 Vascular dementia, unspecified severity, with mood disturbance: Secondary | ICD-10-CM | POA: Diagnosis present

## 2022-08-24 DIAGNOSIS — E876 Hypokalemia: Secondary | ICD-10-CM | POA: Diagnosis present

## 2022-08-24 DIAGNOSIS — W010XXA Fall on same level from slipping, tripping and stumbling without subsequent striking against object, initial encounter: Secondary | ICD-10-CM | POA: Diagnosis present

## 2022-08-24 DIAGNOSIS — K589 Irritable bowel syndrome without diarrhea: Secondary | ICD-10-CM | POA: Diagnosis present

## 2022-08-24 DIAGNOSIS — Y92019 Unspecified place in single-family (private) house as the place of occurrence of the external cause: Secondary | ICD-10-CM | POA: Diagnosis not present

## 2022-08-24 DIAGNOSIS — W19XXXA Unspecified fall, initial encounter: Secondary | ICD-10-CM | POA: Insufficient documentation

## 2022-08-24 DIAGNOSIS — Z515 Encounter for palliative care: Secondary | ICD-10-CM | POA: Diagnosis not present

## 2022-08-24 DIAGNOSIS — M069 Rheumatoid arthritis, unspecified: Secondary | ICD-10-CM | POA: Diagnosis present

## 2022-08-24 LAB — BASIC METABOLIC PANEL
Anion gap: 9 (ref 5–15)
BUN: 20 mg/dL (ref 8–23)
CO2: 28 mmol/L (ref 22–32)
Calcium: 8.7 mg/dL — ABNORMAL LOW (ref 8.9–10.3)
Chloride: 104 mmol/L (ref 98–111)
Creatinine, Ser: 1.11 mg/dL — ABNORMAL HIGH (ref 0.44–1.00)
GFR, Estimated: 50 mL/min — ABNORMAL LOW (ref >60)
Glucose, Bld: 139 mg/dL — ABNORMAL HIGH (ref 70–99)
Potassium: 3.8 mmol/L (ref 3.5–5.1)
Sodium: 141 mmol/L (ref 135–145)

## 2022-08-24 LAB — CBC
HCT: 37.2 % (ref 36.0–46.0)
Hemoglobin: 12.2 g/dL (ref 12.0–15.0)
MCH: 31.7 pg (ref 26.0–34.0)
MCHC: 32.8 g/dL (ref 30.0–36.0)
MCV: 96.6 fL (ref 80.0–100.0)
Platelets: 204 10*3/uL (ref 150–400)
RBC: 3.85 MIL/uL — ABNORMAL LOW (ref 3.87–5.11)
RDW: 14.6 % (ref 11.5–15.5)
WBC: 10.2 10*3/uL (ref 4.0–10.5)
nRBC: 0 % (ref 0.0–0.2)

## 2022-08-24 LAB — MAGNESIUM: Magnesium: 2.2 mg/dL (ref 1.7–2.4)

## 2022-08-24 MED ORDER — OXYCODONE-ACETAMINOPHEN 5-325 MG PO TABS
1.0000 | ORAL_TABLET | ORAL | Status: DC | PRN
  Administered 2022-08-24 – 2022-08-31 (×7): 1 via ORAL
  Filled 2022-08-24 (×7): qty 1

## 2022-08-24 NOTE — Plan of Care (Signed)
°  Problem: Clinical Measurements: °Goal: Diagnostic test results will improve °Outcome: Progressing °Goal: Respiratory complications will improve °Outcome: Progressing °Goal: Cardiovascular complication will be avoided °Outcome: Progressing °  °Problem: Nutrition: °Goal: Adequate nutrition will be maintained °Outcome: Progressing °  °Problem: Coping: °Goal: Level of anxiety will decrease °Outcome: Progressing °  °Problem: Pain Managment: °Goal: General experience of comfort will improve °Outcome: Progressing °  °

## 2022-08-24 NOTE — Progress Notes (Signed)
Progress Note   Patient: Kristin [name] ZHG:[id number] DOB: Mar 11, 1941 DOA: 08/23/2022     0 DOS: the patient was seen and examined on 08/24/2022   Brief hospital course: Taken from H&P.  Kristin [name] is a 82 y.o.Caucasian female with medical history significant for Coronary artery disease, fibromyalgia, DVT, [name], osteoarthritis, rheumatoid arthritis, remote subarachnoid hemorrhage due to ruptured aneurysm, who presented to the emergency room with mechanical fall with subsequent right hip pain and inability to ambulate.     ED Course: Upon presentation to the emergency room, BP was 163/67 with otherwise normal vital signs. BMP revealed hypokalemia of 3.1 and a creatinine 1.17 close to baseline and CBC showed no leukocytosis of 11.8.  Influenza antigens and COVID-19 PCR as well as RSV PCR came back negative. EKG as reviewed by me : EKG showed normal sinus rhythm with a rate of 60. Imaging: 2 view chest x-ray showed blunting of the left lateral CP angle that may be due to minimal pleural effusion or pleural thickening with small linear densities in the lateral aspect of the left lower lung fields that may suggest scarring or subsegmental atelectasis. DG hip with cortical irregularity in the upper margin of right pubic bone and at the junction of right inferior pubic ramus and ischium suggesting possible undisplaced recent or old fracture. CT head and cervical spine was negative for any acute abnormality or fracture. DG lumbar spine and bilateral knees were negative for any acute fractures or injury.  1/11: Blood pressure elevated at 165/72.  Labs with resolution of leukocytosis and hypokalemia.  Some improvement of creatinine to 1.11, remained within her baseline. PT/OT ordered-recommending SNF Orthopedic consult -recommending conservative management and she will be weightbearing as tolerated.  Assessment and Plan: * Pelvic ring fracture ([place]) - The patient has a right pubic ramus fracture  secondary to mechanical fall. Orthopedic surgery is recommending conservative management and she will be weightbearing as tolerated. -Continue with pain management. - PT is recommending SNF.    Hypokalemia Resolved with repletion.  Magnesium within normal limit  Essential hypertension - We will continue her antihypertensives.  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  Peripheral neuropathy - We will continue Neurontin.    Subjective: Patient was seen and examined today.  Continues to have pelvic pain.  Husband at bedside  Physical Exam: Vitals:   08/23/22 2300 08/23/22 2330 08/24/22 0056 08/24/22 0818  BP: (!) 160/70 (!) 151/68 (!) 165/72 (!) 187/76  Pulse: 80 82 72 85  Resp: '18 18 16 16  '$ Temp:   98.9 F (37.2 C) 98.8 F (37.1 C)  TempSrc:      SpO2: 94% 92% 98% 100%  Weight:      Height:       General.  Frail elderly lady, in no acute distress. Pulmonary.  Lungs clear bilaterally, normal respiratory effort. CV.  Regular rate and rhythm, no JVD, rub or murmur. Abdomen.  Soft, nontender, nondistended, BS positive. CNS.  Alert and oriented .  No focal neurologic deficit. Extremities.  No edema, no cyanosis, pulses intact and symmetrical. Psychiatry.  Appears to have some cognitive impairment.  Data Reviewed: Prior data reviewed  Family Communication: Discussed with husband at bedside  Disposition: Status is: Inpatient Remains inpatient appropriate because: Severity of illness  Planned Discharge Destination: Skilled nursing facility  DVT prophylaxis.  Lovenox Time spent: 40 minutes  This record has been created using Systems analyst. Errors have been sought and  corrected,but may not always be located. Such creation errors do not reflect on the standard of care.   Author: Lorella Nimrod, MD 08/24/2022 1:56 PM  For on call review [email or web address].

## 2022-08-24 NOTE — Plan of Care (Signed)
  Problem: Education: Goal: Knowledge of General Education information will improve Description: Including pain rating scale, medication(s)/side effects and non-pharmacologic comfort measures Outcome: Progressing   Problem: Activity: Goal: Risk for activity intolerance will decrease Outcome: Progressing   

## 2022-08-24 NOTE — ED Notes (Signed)
Patient resting comfortable at this time.  Reports pain has improved.  Updated on plan of care.

## 2022-08-24 NOTE — ED Notes (Signed)
Patient transported to floor at this time.

## 2022-08-24 NOTE — Assessment & Plan Note (Addendum)
Resolved with repletion.  Magnesium within normal limit

## 2022-08-24 NOTE — TOC Progression Note (Signed)
Transition of Care [facility]) - Progression Note    Patient Details  Name: Kristin [name] MRN: [id number] Date of Birth: 10-29-1940  Transition of [facility]) CM/SW [place], LCSW Phone Number: 08/24/2022, 4:02 PM  Clinical Narrative:            Expected Discharge Plan and Services                                               Social Determinants of Health (SDOH) Interventions SDOH Screenings   Food Insecurity: No Food Insecurity (08/24/2022)  Housing: Low Risk  (08/24/2022)  Transportation Needs: No Transportation Needs (08/24/2022)  Utilities: Not At Risk (08/24/2022)  Alcohol Screen: Low Risk  (09/21/2021)  Depression (PHQ2-9): Low Risk  (01/11/2022)  Financial Resource Strain: Low Risk  (06/13/2021)  Physical Activity: Insufficiently Active (06/13/2021)  Social Connections: Moderately Isolated (06/13/2021)  Stress: No Stress Concern Present (01/11/2022)  Tobacco Use: Low Risk  (08/23/2022)    Readmission Risk Interventions     No data to display

## 2022-08-24 NOTE — Hospital Course (Addendum)
Taken from H&P.  Kristin [name] is a 82 y.o.Caucasian female with medical history significant for Coronary artery disease, fibromyalgia, DVT, [name], osteoarthritis, rheumatoid arthritis, remote subarachnoid hemorrhage due to ruptured aneurysm, who presented to the emergency room with mechanical fall with subsequent right hip pain and inability to ambulate.     ED Course: Upon presentation to the emergency room, BP was 163/67 with otherwise normal vital signs. BMP revealed hypokalemia of 3.1 and a creatinine 1.17 close to baseline and CBC showed no leukocytosis of 11.8.  Influenza antigens and COVID-19 PCR as well as RSV PCR came back negative. EKG as reviewed by me : EKG showed normal sinus rhythm with a rate of 60. Imaging: 2 view chest x-ray showed blunting of the left lateral CP angle that may be due to minimal pleural effusion or pleural thickening with small linear densities in the lateral aspect of the left lower lung fields that may suggest scarring or subsegmental atelectasis. DG hip with cortical irregularity in the upper margin of right pubic bone and at the junction of right inferior pubic ramus and ischium suggesting possible undisplaced recent or old fracture. CT head and cervical spine was negative for any acute abnormality or fracture. DG lumbar spine and bilateral knees were negative for any acute fractures or injury.  1/11: Blood pressure elevated at 165/72.  Labs with resolution of leukocytosis and hypokalemia.  Some improvement of creatinine to 1.11, remained within her baseline. PT/OT ordered-recommending SNF Orthopedic consult -recommending conservative management and she will be weightbearing as tolerated.  1/12: Blood pressure remained elevated.  Patient is pretty much allergic to most of other antihypertensives and currently on Cardizem only.  Pain can be contributory. Awaiting placement.  1/13: Vital stable with mildly elevated blood pressure.  Awaiting SNF placement

## 2022-08-24 NOTE — Evaluation (Signed)
Physical Therapy Evaluation Patient Details Name: Kristin [name] MRN: [id number] DOB: 1941/02/04 Today's Date: 08/24/2022  History of Present Illness  82 y/o female presented to ED on 08/23/22 following fall. Sustained minimally displcaed R superior/inferior pubic rami fxs. Orthopedics treating non-operatively. 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 PTA, patient lives with husband but unsure if patient requires assistance for mobiltiy or ADLs due to hx of dementia with patient being poor historian. Oriented to self only during session. Limited by pain in R hip with all movement. Presents with weakness, impaired balance, decreased activity tolerance, and impaired functional mobility. Required maxA+2 for bed mobility and partial sit to stand transfer with HHAx2. Patient with delayed responses throughout session. Patient will benefit from skilled PT services during acute stay to address listed deficits. Recommend SNF at discharge to maximize functional mobility and safety to decreased caregiver burden.        Recommendations for follow up therapy are one component of a multi-disciplinary discharge planning process, led by the attending physician.  Recommendations may be updated based on patient status, additional functional criteria and insurance authorization.  Follow Up Recommendations Skilled nursing-short term rehab (<3 hours/day) Can patient physically be transported by private vehicle: No    Assistance Recommended at Discharge Frequent or constant Supervision/Assistance  Patient can return home with the following  Two people to help with walking and/or transfers;A lot of help with bathing/dressing/bathroom;Assistance with cooking/housework    Equipment Recommendations Rolling Jolyn Deshmukh (2 wheels);BSC/3in1;Wheelchair (measurements PT);Wheelchair cushion (measurements PT)  Recommendations for Other Services        Functional Status Assessment Patient has had a recent decline in their functional status and demonstrates the ability to make significant improvements in function in a reasonable and predictable amount of time.     Precautions / Restrictions Precautions Precautions: Fall Restrictions Weight Bearing Restrictions: Yes RLE Weight Bearing: Weight bearing as tolerated      Mobility  Bed Mobility Overal bed mobility: Needs Assistance Bed Mobility: Supine to Sit, Sit to Supine     Supine to sit: Max assist, +2 for physical assistance, +2 for safety/equipment Sit to supine: Max assist, +2 for physical assistance, +2 for safety/equipment   General bed mobility comments: +2 assist for all aspects of bed mobility    Transfers Overall transfer level: Needs assistance Equipment used: 2 person hand held assist Transfers: Sit to/from Stand Sit to Stand: Max assist, +2 physical assistance, +2 safety/equipment           General transfer comment: maxA+2 to partial stand from EOB    Ambulation/Gait                  Stairs            Wheelchair Mobility    Modified Rankin (Stroke Patients Only)       Balance Overall balance assessment: Needs assistance Sitting-balance support: No upper extremity supported, Feet supported Sitting balance-Leahy Scale: Fair     Standing balance support: Bilateral upper extremity supported Standing balance-Leahy Scale: Poor                               Pertinent Vitals/Pain Pain Assessment Pain Assessment: Faces Faces Pain Scale: Hurts whole lot Pain Location: R hip with movement Pain Descriptors / Indicators: Grimacing, Guarding Pain Intervention(s): Limited activity within patient's tolerance, Monitored during session, Premedicated before session, Repositioned  Home Living Family/patient expects to be discharged to:: Private residence Living Arrangements: Spouse/significant other Available Help at  Discharge: Family Type of Home: House             Additional Comments: patient is poor historian so unable to obtain home setup    Prior Function Prior Level of Function : Needs assist;History of Falls (last six months);Patient poor historian/Family not available             Mobility Comments: patient reports using Rhylen Shaheen but unsure if rollator or RW. No family present       Hand Dominance        Extremity/Trunk Assessment   Upper Extremity Assessment Upper Extremity Assessment: Defer to OT evaluation    Lower Extremity Assessment Lower Extremity Assessment: RLE deficits/detail;Generalized weakness RLE Deficits / Details: difficult to fully assess due to pain RLE: Unable to fully assess due to pain    Cervical / Trunk Assessment Cervical / Trunk Assessment: Kyphotic  Communication   Communication: Expressive difficulties;HOH  Cognition Arousal/Alertness: Awake/alert Behavior During Therapy: Flat affect Overall Cognitive Status: History of cognitive impairments - at baseline                                 General Comments: hx of dementia per RN. Delayed responses throughout session. No family present to determine baseline cognition. Follows very direct 1 step commands        General Comments General comments (skin integrity, edema, etc.): On 1L O2, VSS    Exercises     Assessment/Plan    PT Assessment Patient needs continued PT services  PT Problem List Decreased strength;Decreased range of motion;Decreased activity tolerance;Decreased mobility;Decreased balance;Decreased cognition;Decreased knowledge of use of DME;Decreased safety awareness;Decreased knowledge of precautions;Pain       PT Treatment Interventions DME instruction;Gait training;Therapeutic activities;Functional mobility training;Therapeutic exercise;Balance training;Patient/family education    PT Goals (Current goals can be found in the Care Plan section)  Acute Rehab PT  Goals Patient Stated Goal: did not state PT Goal Formulation: Patient unable to participate in goal setting Time For Goal Achievement: 09/07/22 Potential to Achieve Goals: Fair    Frequency 7X/week     Co-evaluation               AM-PAC PT "6 Clicks" Mobility  Outcome Measure Help needed turning from your back to your side while in a flat bed without using bedrails?: Total Help needed moving from lying on your back to sitting on the side of a flat bed without using bedrails?: Total Help needed moving to and from a bed to a chair (including a wheelchair)?: Total Help needed standing up from a chair using your arms (e.g., wheelchair or bedside chair)?: Total Help needed to walk in hospital room?: Total Help needed climbing 3-5 steps with a railing? : Total 6 Click Score: 6    End of Session Equipment Utilized During Treatment: Oxygen Activity Tolerance: Patient limited by pain Patient left: in bed;with call bell/phone within reach;with bed alarm set Nurse Communication: Mobility status PT Visit Diagnosis: Unsteadiness on feet (R26.81);Muscle weakness (generalized) (M62.81);Difficulty in walking, not elsewhere classified (R26.2);Other abnormalities of gait and mobility (R26.89);History of falling (Z91.81)    Time: 8119-1478 PT Time Calculation (min) (ACUTE ONLY): 24 min   Charges:   PT Evaluation $PT Eval Moderate Complexity: 1 Mod          Suhail Peloquin A. Gilford Rile, PT, DPT [facility] -  Acute Rehabilitation Services   Linna Hoff 08/24/2022, 12:26 PM

## 2022-08-24 NOTE — Assessment & Plan Note (Signed)
-   We will continue Neurontin. ?

## 2022-08-24 NOTE — Assessment & Plan Note (Signed)
-   We will continue her antihypertensives. 

## 2022-08-24 NOTE — TOC Initial Note (Signed)
Transition of Care [facility]) - Initial/Assessment Note    Patient Details  Name: Kristin [name] MRN: [id number] Date of Birth: [birth year]  Transition of Care [facility]) CM/SW Contact:    Quin Hoop, LCSW Phone Number: 08/24/2022, 4:07 PM  Clinical Narrative:                 CSW completed assessment with patient's husband, [name].  Patient was asleep when visit made to room.  Discussed discharge recommendation with [name]. Informed him that referrals would be made to local SNF and bed offers would be shared with family when received.    Patient's PCP is [facility] (Dr. Garnette Gunner).  Meds are filled at [facility].  There are no concerns about medications,  Pt is driven to appts by [name].  She has the following DME at home:  walker, rollator.    CSW will continue to follow for TOC needs.  Expected Discharge Plan: Skilled Nursing Facility Barriers to Discharge: Continued Medical Work up   Patient Goals and CMS Choice Patient states their goals for this hospitalization and ongoing recovery are:: to go home          Expected Discharge Plan and Services     Post Acute Care Choice:  (unknown at this time) Living arrangements for the past 2 months: Single Family Home                                      Prior Living Arrangements/Services Living arrangements for the past 2 months: Single Family Home Lives with:: Spouse                   Activities of Daily Living Home Assistive Devices/Equipment: None ADL Screening (condition at time of admission) Patient's cognitive ability adequate to safely complete daily activities?: Yes Is the patient deaf or have difficulty hearing?: No Does the patient have difficulty seeing, even when wearing glasses/contacts?: No Does the patient have difficulty concentrating, remembering, or making decisions?: No Patient able to express need for assistance with ADLs?: Yes Does the patient have difficulty dressing or bathing?:  No Independently performs ADLs?: Yes (appropriate for developmental age) Does the patient have difficulty walking or climbing stairs?: No Weakness of Legs: Both Weakness of Arms/Hands: Both  Permission Sought/Granted                  Emotional Assessment Appearance::  (unable to assess  pt asleep.  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   Peripheral neuropathy 08/24/2022   Essential hypertension 08/24/2022   Hypokalemia 08/24/2022   Closed fracture of ramus of right pubis ([place]) 08/24/2022   Fall 08/24/2022   Pelvic ring fracture ([place]) 08/23/2022   Osteopenia 01/06/2022   CKD (chronic kidney disease) stage 3, GFR 30-59 ml/min ([place]) 05/18/2021   History of stroke 01/03/2021   Vascular dementia ([place]) 01/03/2021   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 ([place]) 08/27/2015   Osteoarthritis 08/10/2011   MYOCARDIAL INFARCTION, HX OF  08/01/2010   Fibromyalgia 05/20/2008   Hyperlipidemia 05/29/2007   OBSTRUCTIVE SLEEP APNEA 05/29/2007   Essential hypertension, benign 05/29/2007   PCP:  [name], NP Pharmacy:   [place], [place] - [address] [place] [address] Phone: [phone] Fax: [phone]     Social Determinants of Health (SDOH) Social History: SDOH Screenings   Food Insecurity: No Food Insecurity (08/24/2022)  Housing: Low Risk  (08/24/2022)  Transportation Needs: No  Transportation Needs (08/24/2022)  Utilities: Not At Risk (08/24/2022)  Alcohol Screen: Low Risk  (09/21/2021)  Depression (PHQ2-9): Low Risk  (01/11/2022)  Financial Resource Strain: Low Risk  (06/13/2021)  Physical Activity: Insufficiently Active (06/13/2021)  Social Connections: Moderately Isolated (06/13/2021)  Stress: No Stress Concern Present (01/11/2022)  Tobacco Use: Low Risk  (08/23/2022)   SDOH Interventions:     Readmission Risk Interventions     No data to display

## 2022-08-24 NOTE — Assessment & Plan Note (Signed)
-   We will continue Aricept, Namenda and Cymbalta.

## 2022-08-24 NOTE — Progress Notes (Signed)
  Transition of Care [facility]) Screening Note   Patient Details  Name: Kristin [name] Date of Birth: 17-Jun-1941   Transition of Care [facility]) CM/SW Contact:    Quin Hoop, LCSW Phone Number: 08/24/2022, 8:41 AM    Transition of Care [facility]) has reviewed patient and no TOC needs have been identified at this time. We will continue to monitor patient advancement through interdisciplinary progression rounds. If new patient transition needs arise, please place a TOC consult.

## 2022-08-24 NOTE — Progress Notes (Signed)
Right hip/pelvis imaging reviewed. There appears to be a minimally displaced R superior/inferior pubic rami fractures. Recommend WBAT on RLE + PT/OT for mobilization.

## 2022-08-24 NOTE — Evaluation (Signed)
Occupational Therapy Evaluation Patient Details Name: Kristin [name] MRN: [id number] DOB: 1940/11/19 Today's Date: 08/24/2022   History of Present Illness 82 y/o female presented to ED on 08/23/22 following fall. Sustained minimally displcaed R superior/inferior pubic rami fxs. Orthopedics treating non-operatively. 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 Pt is poor historian, reports mobile with device PTA and lives with spouse. Pt presents to acute OT demonstrating impaired ADL performance and functional mobility 2/2 pain, decreased activity tolerance and functional strength/ROM/balance deficits. Pt currently requires MAX A x2 bed mobility. MAX A don B socks seated EOB. MIN A self-feeding banana in sitting, cues to redirect to task and initiate. MAX A x2 + HHA sit<>stand, cleats rear. Pt would benefit from skilled OT to address noted impairments and functional limitations (see below for any additional details). Upon hospital discharge, recommend STR to maximize pt safety and return to PLOF.    Recommendations for follow up therapy are one component of a multi-disciplinary discharge planning process, led by the attending physician.  Recommendations may be updated based on patient status, additional functional criteria and insurance authorization.   Follow Up Recommendations  Skilled nursing-short term rehab (<3 hours/day)     Assistance Recommended at Discharge Frequent or constant Supervision/Assistance  Patient can return home with the following Two people to help with walking and/or transfers;Two people to help with bathing/dressing/bathroom;Help with stairs or ramp for entrance    Functional Status Assessment  Patient has had a recent decline in their functional status and demonstrates the ability to make significant improvements in function in a reasonable and predictable amount of time.  Equipment  Recommendations  BSC/3in1    Recommendations for Other Services       Precautions / Restrictions Precautions Precautions: Fall Restrictions Weight Bearing Restrictions: Yes RLE Weight Bearing: Weight bearing as tolerated      Mobility Bed Mobility Overal bed mobility: Needs Assistance Bed Mobility: Supine to Sit, Sit to Supine     Supine to sit: Max assist, +2 for physical assistance, HOB elevated Sit to supine: Total assist, +2 for physical assistance        Transfers Overall transfer level: Needs assistance Equipment used: 2 person hand held assist Transfers: Sit to/from Stand Sit to Stand: Max assist, +2 physical assistance           General transfer comment: clears rear, limited by pain      Balance Overall balance assessment: Needs assistance Sitting-balance support: No upper extremity supported, Feet supported Sitting balance-Leahy Scale: Fair     Standing balance support: Bilateral upper extremity supported Standing balance-Leahy Scale: Poor                             ADL either performed or assessed with clinical judgement   ADL Overall ADL's : Needs assistance/impaired                                       General ADL Comments: MAX A don B socks seated EOB. MIN A self-feeding banana in sitting, cues to redirect to task and initiate. MAX A x2 + HHA for simulated BSC t/f      Pertinent Vitals/Pain Pain Assessment Pain Assessment: Faces Faces Pain Scale: Hurts whole lot Pain Location: R hip with movement  Pain Descriptors / Indicators: Grimacing, Guarding Pain Intervention(s): Limited activity within patient's tolerance, Repositioned     Hand Dominance     Extremity/Trunk Assessment Upper Extremity Assessment Upper Extremity Assessment: Generalized weakness   Lower Extremity Assessment Lower Extremity Assessment: Difficult to assess due to impaired cognition RLE Deficits / Details: difficult to fully assess  due to pain RLE: Unable to fully assess due to pain   Cervical / Trunk Assessment Cervical / Trunk Assessment: Kyphotic   Communication Communication Communication: Expressive difficulties;HOH   Cognition Arousal/Alertness: Awake/alert Behavior During Therapy: Flat affect Overall Cognitive Status: History of cognitive impairments - at baseline                                 General Comments: hx of dementia per RN. Delayed responses throughout session. No family present to determine baseline cognition. Follows very direct 1 step commands with repetition and tactile cues     General Comments  On 1L O2, VSS     Home Living Family/patient expects to be discharged to:: Private residence Living Arrangements: Spouse/significant other Available Help at Discharge: Family Type of Home: House                           Additional Comments: patient is poor historian so unable to obtain home setup      Prior Functioning/Environment Prior Level of Function : Needs assist;History of Falls (last six months);Patient poor historian/Family not available             Mobility Comments: patient reports using walker but unsure if rollator or RW. No family present          OT Problem List: Decreased strength;Decreased activity tolerance;Impaired balance (sitting and/or standing);Decreased safety awareness      OT Treatment/Interventions: Self-care/ADL training;Therapeutic exercise;DME and/or AE instruction;Energy conservation;Therapeutic activities;Balance training;Patient/family education    OT Goals(Current goals can be found in the care plan section) Acute Rehab OT Goals Patient Stated Goal: to walk OT Goal Formulation: With patient Time For Goal Achievement: 09/07/22 Potential to Achieve Goals: Good ADL Goals Pt Will Perform Grooming: with set-up;with supervision;sitting Pt Will Perform Lower Body Dressing: sit to/from stand;with min assist Pt Will  Transfer to Toilet: with min assist;squat pivot transfer;bedside commode  OT Frequency: Min 2X/week    Co-evaluation PT/OT/SLP Co-Evaluation/Treatment: Yes Reason for Co-Treatment: Necessary to address cognition/behavior during functional activity;To address functional/ADL transfers PT goals addressed during session: Mobility/safety with mobility OT goals addressed during session: ADL's and self-care      AM-PAC OT "6 Clicks" Daily Activity     Outcome Measure Help from another person eating meals?: A Little Help from another person taking care of personal grooming?: A Little Help from another person toileting, which includes using toliet, bedpan, or urinal?: A Lot Help from another person bathing (including washing, rinsing, drying)?: A Lot Help from another person to put on and taking off regular upper body clothing?: A Little Help from another person to put on and taking off regular lower body clothing?: A Lot 6 Click Score: 15   End of Session    Activity Tolerance: Patient tolerated treatment well Patient left: in bed;with call bell/phone within reach;with bed alarm set  OT Visit Diagnosis: Other abnormalities of gait and mobility (R26.89);Muscle weakness (generalized) (M62.81)                Time: 8811-0315 OT Time Calculation (  min): 24 min Charges:  OT General Charges $OT Visit: 1 Visit OT Evaluation $OT Eval Moderate Complexity: 1 Mod OT Treatments $Self Care/Home Management : 8-22 mins  Dessie Coma, M.S. OTR/L  08/24/22, 2:14 PM  ascom [phone]

## 2022-08-24 NOTE — Assessment & Plan Note (Addendum)
-   The patient has a right pubic ramus fracture secondary to mechanical fall. Orthopedic surgery is recommending conservative management and she will be weightbearing as tolerated. -Continue with pain management. - PT is recommending SNF.

## 2022-08-25 DIAGNOSIS — S32810A Multiple fractures of pelvis with stable disruption of pelvic ring, initial encounter for closed fracture: Secondary | ICD-10-CM | POA: Diagnosis not present

## 2022-08-25 NOTE — Plan of Care (Signed)
  Problem: Education: Goal: Knowledge of General Education information will improve Description: Including pain rating scale, medication(s)/side effects and non-pharmacologic comfort measures Outcome: Progressing   Problem: Activity: Goal: Risk for activity intolerance will decrease Outcome: Progressing   Problem: Pain Managment: Goal: General experience of comfort will improve Outcome: Progressing

## 2022-08-25 NOTE — Progress Notes (Signed)
Physical Therapy Treatment Patient Details Name: Kristin [name] MRN: [id number] DOB: 1940/10/22 Today's Date: 08/25/2022   History of Present Illness 82 y/o female presented to ED on 08/23/22 following fall. Sustained minimally displcaed R superior/inferior pubic rami fxs. Orthopedics treating non-operatively. 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 Supine in bed on arrival and pleasantly confused. Patient required maxA+2 for bed mobility with patient assisting with slight trunk elevation. Attempted to stand at EOB but required totalA+2 to partially stand. Not following commands this session and with increasing forward flexed posture sitting EOB. No family present during session. Continue to recommend SNF for ongoing Physical Therapy.       Recommendations for follow up therapy are one component of a multi-disciplinary discharge planning process, led by the attending physician.  Recommendations may be updated based on patient status, additional functional criteria and insurance authorization.  Follow Up Recommendations  Skilled nursing-short term rehab (<3 hours/day) Can patient physically be transported by private vehicle: No   Assistance Recommended at Discharge Frequent or constant Supervision/Assistance  Patient can return home with the following Two people to help with walking and/or transfers;A lot of help with bathing/dressing/bathroom;Assistance with cooking/housework   Equipment Recommendations  Rolling [place] Be[place] (2 wheels);BSC/3in1;Wheelchair (measurements PT);Wheelchair cushion (measurements PT)    Recommendations for Other Services       Precautions / Restrictions Precautions Precautions: Fall Restrictions Weight Bearing Restrictions: Yes RLE Weight Bearing: Weight bearing as tolerated     Mobility  Bed Mobility Overal bed mobility: Needs Assistance Bed  Mobility: Supine to Sit, Sit to Supine     Supine to sit: Max assist, +2 for physical assistance, HOB elevated Sit to supine: Total assist, +2 for physical assistance   General bed mobility comments: +2 assist for all aspects of bed mobility    Transfers Overall transfer level: Needs assistance Equipment used: Rolling [place]ta[place] Reitman (2 wheels) Transfers: Sit to/from Stand Sit to Stand: Total assist, +2 physical assistance, +2 safety/equipment           General transfer comment: totalA+2 to attempt standing from EOB and able to clear buttocks but patient not pushing through BLEs to assist. Lateral scooted up towards [facility] with totalA+2    Ambulation/Gait               General Gait Details: unable   Stairs             Wheelchair Mobility    Modified Rankin (Stroke Patients Only)       Balance Overall balance assessment: Needs assistance Sitting-balance support: No upper extremity supported, Feet supported Sitting balance-Leahy Scale: Fair Sitting balance - Comments: forward lean this date with inability to correct despite multimodal cueing   Standing balance support: Bilateral upper extremity supported Standing balance-Leahy Scale: Zero                              Cognition Arousal/Alertness: Awake/alert Behavior During Therapy: Flat affect Overall Cognitive Status: History of cognitive impairments - at baseline                                          Exercises      General Comments General comments (skin integrity, edema, etc.): VSS  on RA      Pertinent Vitals/Pain Pain Assessment Pain Assessment: Faces Faces Pain Scale: Hurts whole lot Pain Location: R hip with movement Pain Descriptors / Indicators: Grimacing, Guarding Pain Intervention(s): Monitored during session, Limited activity within patient's tolerance, Repositioned    Home Living                          Prior Function            PT Goals  (current goals can now be found in the care plan section) Acute Rehab PT Goals Patient Stated Goal: did not state PT Goal Formulation: Patient unable to participate in goal setting Time For Goal Achievement: 09/07/22 Potential to Achieve Goals: Fair Progress towards PT goals: Not progressing toward goals - comment    Frequency    7X/week      PT Plan Current plan remains appropriate    Co-evaluation              AM-PAC PT "6 Clicks" Mobility   Outcome Measure  Help needed turning from your back to your side while in a flat bed without using bedrails?: Total Help needed moving from lying on your back to sitting on the side of a flat bed without using bedrails?: Total Help needed moving to and from a bed to a chair (including a wheelchair)?: Total Help needed standing up from a chair using your arms (e.g., wheelchair or bedside chair)?: Total Help needed to walk in hospital room?: Total Help needed climbing 3-5 steps with a railing? : Total 6 Click Score: 6    End of Session   Activity Tolerance: Patient limited by pain Patient left: in bed;with call bell/phone within reach;with bed alarm set Nurse Communication: Mobility status PT Visit Diagnosis: Unsteadiness on feet (R26.81);Muscle weakness (generalized) (M62.81);Difficulty in walking, not elsewhere classified (R26.2);Other abnormalities of gait and mobility (R26.89);History of falling (Z91.81)     Time: 1100-1115 PT Time Calculation (min) (ACUTE ONLY): 15 min  Charges:  $Therapeutic Activity: 8-22 mins                     Kristin [name] PT, DPT [place] Durell 08/25/2022, 1:02 PM

## 2022-08-25 NOTE — Assessment & Plan Note (Signed)
-  The patient has a right pubic ramus fracture secondary to mechanical fall. Orthopedic surgery is recommending conservative management and she will be weightbearing as tolerated. -Continue with pain management. - PT is recommending SNF.

## 2022-08-25 NOTE — Progress Notes (Signed)
Progress Note   Patient: Kristin [name]:[id number] DOB: Sep 10, 1940 DOA: 08/23/2022     1 DOS: the patient was seen and examined on 08/25/2022   Brief hospital course: Taken from H&P.  Kristin [name] is a 82 y.o.Caucasian female with medical history significant for Coronary artery disease, fibromyalgia, DVT, [name], osteoarthritis, rheumatoid arthritis, remote subarachnoid hemorrhage due to ruptured aneurysm, who presented to the emergency room with mechanical fall with subsequent right hip pain and inability to ambulate.     ED Course: Upon presentation to the emergency room, BP was 163/67 with otherwise normal vital signs. BMP revealed hypokalemia of 3.1 and a creatinine 1.17 close to baseline and CBC showed no leukocytosis of 11.8.  Influenza antigens and COVID-19 PCR as well as RSV PCR came back negative. EKG as reviewed by me : EKG showed normal sinus rhythm with a rate of 60. Imaging: 2 view chest x-ray showed blunting of the left lateral CP angle that may be due to minimal pleural effusion or pleural thickening with small linear densities in the lateral aspect of the left lower lung fields that may suggest scarring or subsegmental atelectasis. DG hip with cortical irregularity in the upper margin of right pubic bone and at the junction of right inferior pubic ramus and ischium suggesting possible undisplaced recent or old fracture. CT head and cervical spine was negative for any acute abnormality or fracture. DG lumbar spine and bilateral knees were negative for any acute fractures or injury.  1/11: Blood pressure elevated at 165/72.  Labs with resolution of leukocytosis and hypokalemia.  Some improvement of creatinine to 1.11, remained within her baseline. PT/OT ordered-recommending SNF Orthopedic consult -recommending conservative management and she will be weightbearing as tolerated.  1/12: Blood pressure remained elevated.  Patient is pretty much allergic to most of other  antihypertensives and currently on Cardizem only.  Pain can be contributory. Awaiting placement  Assessment and Plan: * Pelvic ring fracture ([place]) - The patient has a right pubic ramus fracture secondary to mechanical fall. Orthopedic surgery is recommending conservative management and she will be weightbearing as tolerated. -Continue with pain management. - PT is recommending SNF.    Hypokalemia Resolved with repletion.  Magnesium within normal limit  Essential hypertension - We will continue her antihypertensives.  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  Peripheral neuropathy - We will continue Neurontin.    Subjective: Patient was resting comfortably when seen today.  When asked about pain she noted yes.  Unable to explain more than that.  Physical Exam: Vitals:   08/24/22 2226 08/25/22 0040 08/25/22 0817 08/25/22 1516  BP: (!) 166/90 (!) 176/86 (!) 170/66 (!) 152/65  Pulse:  87 84 71  Resp:  '18 17 19  '$ Temp:  99.3 F (37.4 C) 98.1 F (36.7 C) 98.3 F (36.8 C)  TempSrc:      SpO2:  100% 99% 95%  Weight:      Height:       General.  Frail elderly lady, in no acute distress. Pulmonary.  Lungs clear bilaterally, normal respiratory effort. CV.  Regular rate and rhythm, no JVD, rub or murmur. Abdomen.  Soft, nontender, nondistended, BS positive. CNS.  Alert and oriented .  No focal neurologic deficit. Extremities.  No edema, no cyanosis, pulses intact and symmetrical. Psychiatry.  Judgment and insight appears impaired.  Data Reviewed: Prior data reviewed  Family Communication: Discussed with husband at bedside  Disposition: Status is: Inpatient Remains inpatient  appropriate because: Severity of illness  Planned Discharge Destination: Skilled nursing facility  DVT prophylaxis.  Lovenox Time spent: 39 minutes  This record has been created using Systems analyst. Errors have been  sought and corrected,but may not always be located. Such creation errors do not reflect on the standard of care.   Author: Lorella Nimrod, MD 08/25/2022 4:56 PM  For on call review [email or web address].

## 2022-08-25 NOTE — Assessment & Plan Note (Signed)
Resolved with repletion.  Magnesium within normal limit

## 2022-08-26 DIAGNOSIS — S32810A Multiple fractures of pelvis with stable disruption of pelvic ring, initial encounter for closed fracture: Secondary | ICD-10-CM | POA: Diagnosis not present

## 2022-08-26 MED ORDER — POLYETHYLENE GLYCOL 3350 17 G PO PACK
17.0000 g | PACK | Freq: Every day | ORAL | Status: DC
  Administered 2022-08-26 – 2022-08-31 (×5): 17 g via ORAL
  Filled 2022-08-26 (×5): qty 1

## 2022-08-26 NOTE — Progress Notes (Addendum)
1822 Pt voided 217m of amber urine. Bladder scan shows 5844mDr AmReesa Chewade aware.  1630 Verbal orders for straight cath one time, and give miralax. MD aware of pt poor appetite, unknown BM and low intake.

## 2022-08-26 NOTE — Progress Notes (Signed)
Progress Note   Patient: Kristin [name] JXB:[id number] DOB: 1941-01-04 DOA: 08/23/2022     2 DOS: the patient was seen and examined on 08/26/2022   Brief hospital course: Taken from H&P.  NATIYA SEELINGER is a 82 y.o.Caucasian female with medical history significant for Coronary artery disease, fibromyalgia, DVT, [name], osteoarthritis, rheumatoid arthritis, remote subarachnoid hemorrhage due to ruptured aneurysm, who presented to the emergency room with mechanical fall with subsequent right hip pain and inability to ambulate.     ED Course: Upon presentation to the emergency room, BP was 163/67 with otherwise normal vital signs. BMP revealed hypokalemia of 3.1 and a creatinine 1.17 close to baseline and CBC showed no leukocytosis of 11.8.  Influenza antigens and COVID-19 PCR as well as RSV PCR came back negative. EKG as reviewed by me : EKG showed normal sinus rhythm with a rate of 60. Imaging: 2 view chest x-ray showed blunting of the left lateral CP angle that may be due to minimal pleural effusion or pleural thickening with small linear densities in the lateral aspect of the left lower lung fields that may suggest scarring or subsegmental atelectasis. DG hip with cortical irregularity in the upper margin of right pubic bone and at the junction of right inferior pubic ramus and ischium suggesting possible undisplaced recent or old fracture. CT head and cervical spine was negative for any acute abnormality or fracture. DG lumbar spine and bilateral knees were negative for any acute fractures or injury.  1/11: Blood pressure elevated at 165/72.  Labs with resolution of leukocytosis and hypokalemia.  Some improvement of creatinine to 1.11, remained within her baseline. PT/OT ordered-recommending SNF Orthopedic consult -recommending conservative management and she will be weightbearing as tolerated.  1/12: Blood pressure remained elevated.  Patient is pretty much allergic to most of other  antihypertensives and currently on Cardizem only.  Pain can be contributory. Awaiting placement.  1/13: Vital stable with mildly elevated blood pressure.  Awaiting SNF placement  Assessment and Plan: * Pelvic ring fracture ([place]) - The patient has a right pubic ramus fracture secondary to mechanical fall. Orthopedic surgery is recommending conservative management and she will be weightbearing as tolerated. -Continue with pain management. - PT is recommending SNF.    Hypokalemia Resolved with repletion.  Magnesium within normal limit  Essential hypertension - We will continue her antihypertensives.  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  Peripheral neuropathy - We will continue Neurontin.    Subjective: Patient was awake and oriented to self only when seen today.  Denies any pain.  She was trying to eat her breakfast.  Physical Exam: Vitals:   08/25/22 1516 08/26/22 0026 08/26/22 0213 08/26/22 0905  BP: (!) 152/65 (!) 134/122 (!) 154/60 (!) 171/63  Pulse: 71 80 87 70  Resp: '19 18  18  '$ Temp: 98.3 F (36.8 C) 98.7 F (37.1 C)  97.7 F (36.5 C)  TempSrc:      SpO2: 95% 95% 95% 98%  Weight:      Height:       General.  Frail elderly lady, in no acute distress. Pulmonary.  Lungs clear bilaterally, normal respiratory effort. CV.  Regular rate and rhythm, no JVD, rub or murmur. Abdomen.  Soft, nontender, nondistended, BS positive. CNS.  Alert and oriented .  No focal neurologic deficit. Extremities.  No edema, no cyanosis, pulses intact and symmetrical. Psychiatry.  Judgment and insight appears impaired  Data Reviewed: Prior data reviewed  Family Communication: Discussed with husband at bedside  Disposition: Status is: Inpatient Remains inpatient appropriate because: Severity of illness  Planned Discharge Destination: Skilled nursing facility  DVT prophylaxis.  Lovenox Time spent: 38 minutes  This  record has been created using Systems analyst. Errors have been sought and corrected,but may not always be located. Such creation errors do not reflect on the standard of care.   Author: Lorella Nimrod, MD 08/26/2022 2:50 PM  For on call review [email or web address].

## 2022-08-26 NOTE — Plan of Care (Signed)
  Problem: Activity: Goal: Risk for activity intolerance will decrease Outcome: Progressing   Problem: Safety: Goal: Ability to remain free from injury will improve Outcome: Progressing   Problem: Skin Integrity: Goal: Risk for impaired skin integrity will decrease Outcome: Progressing   Problem: Health Behavior/Discharge Planning: Goal: Ability to manage health-related needs will improve Outcome: Progressing

## 2022-08-26 NOTE — Progress Notes (Signed)
Physical Therapy Treatment Patient Details Name: Kristin [name] MRN: [id number] DOB: 1941-03-07 Today's Date: 08/26/2022   History of Present Illness 82 y/o female presented to ED on 08/23/22 following fall. Sustained minimally displcaed R superior/inferior pubic rami fxs. Orthopedics treating non-operatively. PMH: [name], fibromyalgia, RA, remote SAH due to ruptured aneurysm    PT Comments     Pt progressing with sit<>stands from EOB, +2 Max assist, with increased LE extension and trunk involvement.  Pt continues to be limited with mobility demonstrating severe pain at times with LE movement and fatigue/poor activity tolerance.  Current PT d/c plan is appropriate.   Recommendations for follow up therapy are one component of a multi-disciplinary discharge planning process, led by the attending physician.  Recommendations may be updated based on patient status, additional functional criteria and insurance authorization.  Follow Up Recommendations  Skilled nursing-short term rehab (<3 hours/day) Can patient physically be transported by private vehicle: No   Assistance Recommended at Discharge Frequent or constant Supervision/Assistance  Patient can return home with the following Two people to help with walking and/or transfers;A lot of help with bathing/dressing/bathroom;Assistance with cooking/housework   Equipment Recommendations  Rolling walker (2 wheels);BSC/3in1;Wheelchair (measurements PT);Wheelchair cushion (measurements PT)    Recommendations for Other Services       Precautions / Restrictions Precautions Precautions: Fall Restrictions Weight Bearing Restrictions: Yes RLE Weight Bearing: Weight bearing as tolerated     Mobility  Bed Mobility Overal bed mobility: Needs Assistance Bed Mobility: Supine to Sit, Sit to Supine     Supine to sit: Max assist, +2 for physical assistance, HOB elevated Sit to supine: Total assist, +2 for physical assistance   General bed mobility  comments: +2 assist for all aspects of bed mobility    Transfers Overall transfer level: Needs assistance Equipment used: Rolling walker (2 wheels) Transfers: Sit to/from Stand Sit to Stand: +2 physical assistance, +2 safety/equipment, Max assist           General transfer comment: Pt able to lift buttocks off bed, multiple reps with greater knee extension each time.    Ambulation/Gait               General Gait Details: attempted side stepping along bed but pt unable to weight shift sufficiently.   Stairs             Wheelchair Mobility    Modified Rankin (Stroke Patients Only)       Balance Overall balance assessment: Needs assistance Sitting-balance support: Single extremity supported, Feet supported Sitting balance-Leahy Scale: Poor Sitting balance - Comments: poor trunk control requiring mod A to find midline position. Postural control: Right lateral lean Standing balance support: Bilateral upper extremity supported Standing balance-Leahy Scale: Poor Standing balance comment: standing with RW; bilateral UE support; cues for LE extension and upright gaze.                            Cognition Arousal/Alertness: Lethargic Behavior During Therapy: Flat affect Overall Cognitive Status: History of cognitive impairments - at baseline                                 General Comments: hx of dementia per RN. Delayed responses throughout session. No family present to determine baseline cognition. Follows very direct 1 step commands with repetition and tactile cues        Exercises  General Comments        Pertinent Vitals/Pain Pain Assessment Pain Assessment: Faces Faces Pain Scale: Hurts even more Pain Location: R hip with movement Pain Descriptors / Indicators: Grimacing, Guarding Pain Intervention(s): Premedicated before session    Home Living                          Prior Function            PT  Goals (current goals can now be found in the care plan section) Acute Rehab PT Goals Patient Stated Goal: did not state PT Goal Formulation: Patient unable to participate in goal setting Time For Goal Achievement: 09/07/22 Potential to Achieve Goals: Fair Progress towards PT goals: Progressing toward goals    Frequency    7X/week      PT Plan Current plan remains appropriate    Co-evaluation              AM-PAC PT "6 Clicks" Mobility   Outcome Measure  Help needed turning from your back to your side while in a flat bed without using bedrails?: Total Help needed moving from lying on your back to sitting on the side of a flat bed without using bedrails?: Total Help needed moving to and from a bed to a chair (including a wheelchair)?: Total Help needed standing up from a chair using your arms (e.g., wheelchair or bedside chair)?: Total Help needed to walk in hospital room?: Total Help needed climbing 3-5 steps with a railing? : Total 6 Click Score: 6    End of Session Equipment Utilized During Treatment: Gait belt Activity Tolerance: Patient limited by pain;Patient limited by fatigue Patient left: in bed;with call bell/phone within reach;with bed alarm set Nurse Communication: Mobility status PT Visit Diagnosis: Unsteadiness on feet (R26.81);Muscle weakness (generalized) (M62.81);Difficulty in walking, not elsewhere classified (R26.2);Other abnormalities of gait and mobility (R26.89);History of falling (Z91.81)     Time: 1335-1400 PT Time Calculation (min) (ACUTE ONLY): 25 min  Charges:  $Therapeutic Activity: 23-37 mins                     Bjorn Loser, PTA  08/26/22, 2:58 PM

## 2022-08-26 NOTE — Plan of Care (Signed)

## 2022-08-27 DIAGNOSIS — S32810A Multiple fractures of pelvis with stable disruption of pelvic ring, initial encounter for closed fracture: Secondary | ICD-10-CM | POA: Diagnosis not present

## 2022-08-27 MED ORDER — CLONIDINE HCL 0.1 MG/24HR TD PTWK
0.1000 mg | MEDICATED_PATCH | TRANSDERMAL | Status: DC
  Administered 2022-08-27: 0.1 mg via TRANSDERMAL
  Filled 2022-08-27: qty 1

## 2022-08-27 MED ORDER — ENSURE ENLIVE PO LIQD
237.0000 mL | Freq: Two times a day (BID) | ORAL | Status: DC
  Administered 2022-08-27 – 2022-08-28 (×3): 237 mL via ORAL

## 2022-08-27 MED ORDER — LACTATED RINGERS IV SOLN: INTRAVENOUS | Status: AC

## 2022-08-27 NOTE — TOC Progression Note (Addendum)
Transition of Care [facility]) - Progression Note    Patient Details  Name: Kristin [name] MRN: [id number] Date of Birth: 06-05-1941  [facility] CM/SW Contact  Izola Price, RN Phone Number: 08/27/2022, 3:04 PM  Clinical Narrative:  08/27/22: Damaris Schooner with spouse who was with son riding home from hospital. They had not received Medicare list of facilities though they were aware of plans for STR/SNF. Gave website information, he has access, and will look at list this evening and get back to this RN CM or CSW on Monday will follow up. Barbie Blen Ransome RN CM    350 pm. PASRR under manual review, FL2 signed with PASRR pending noted. Pending patient choices after reviewing [email or web address]. Simmie Davies RN CM   Expected Discharge Plan: Skilled Nursing Facility Barriers to Discharge: Continued Medical Work up  Expected Discharge Plan and Services     Post Acute Care Choice:  (unknown at this time) Living arrangements for the past 2 months: Single Family Home                                       Social Determinants of Health (SDOH) Interventions SDOH Screenings   Food Insecurity: No Food Insecurity (08/24/2022)  Housing: Low Risk  (08/24/2022)  Transportation Needs: No Transportation Needs (08/24/2022)  Utilities: Not At Risk (08/24/2022)  Alcohol Screen: Low Risk  (09/21/2021)  Depression (PHQ2-9): Low Risk  (01/11/2022)  Financial Resource Strain: Low Risk  (06/13/2021)  Physical Activity: Insufficiently Active (06/13/2021)  Social Connections: Moderately Isolated (06/13/2021)  Stress: No Stress Concern Present (01/11/2022)  Tobacco Use: Low Risk  (08/23/2022)    Readmission Risk Interventions     No data to display

## 2022-08-27 NOTE — NC FL2 (Signed)
[place] LEVEL OF CARE FORM     IDENTIFICATION  Patient Name: Kristin [name] Birthdate: February 12, 1941 Sex: female Admission Date (Current Location): 08/23/2022  [facility] and [place] Number:  Engineering geologist and Address:  [facility], [address] [place], [place] [address]      Provider Number: [id number]  Attending Physician Name and Address:  Lorella Nimrod, MD  Relative Name and Phone Number:  Shelp,[name] (Spouse) [phone] (Home Phone)    Current Level of Care: Hospital Recommended Level of Care: [place] Prior Approval Number:  (PENDING)  Date Approved/Denied:   PASRR Number:  (PENDING MANUAL REVIEW)  Discharge Plan: SNF    Current Diagnoses: Patient Active Problem List   Diagnosis Date Noted   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[place]) 08/24/2022   Peripheral neuropathy 08/24/2022   Essential hypertension 08/24/2022   Hypokalemia 08/24/2022   Closed fracture of ramus of right pubis ([place]) 08/24/2022   Fall 08/24/2022   Pelvic ring fracture ([place]) 08/23/2022   Osteopenia 01/06/2022   CKD (chronic kidney disease) stage 3, GFR 30-59 ml/min ([place]) 05/18/2021   History of stroke 01/03/2021   Vascular dementia ([place]) 01/03/2021   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 ([place]) 08/27/2015   Osteoarthritis 08/10/2011   MYOCARDIAL INFARCTION, HX OF 08/01/2010   Fibromyalgia 05/20/2008   Hyperlipidemia 05/29/2007   OBSTRUCTIVE SLEEP APNEA 05/29/2007   Essential hypertension, benign 05/29/2007    Orientation RESPIRATION BLADDER Height & Weight     Self  Normal Continent Weight: 67 kg Height:  '5\' 7"'$  (170.2 cm)  BEHAVIORAL SYMPTOMS/MOOD NEUROLOGICAL BOWEL NUTRITION STATUS      Continent (No BM recorded since admission on 08/24/22) Diet  AMBULATORY STATUS COMMUNICATION OF NEEDS Skin   Extensive Assist Verbally Bruising (Right Hip)                        Personal Care Assistance Level of Assistance  Bathing, Feeding, Dressing Bathing Assistance: Maximum assistance Feeding assistance: Limited assistance Dressing Assistance: Maximum assistance     Functional Limitations Info             SPECIAL CARE FACTORS FREQUENCY  PT (By licensed PT), OT (By licensed OT)     PT Frequency: 5x/week OT Frequency: 5x/week            Contractures Contractures Info: Not present    Additional Factors Info  Code Status, Allergies Code Status Info: Full Code Allergies Info: Angiotensin Receptor Blockers, Other, Hydralazine, Latex, Losartan, Methotrexate Derivatives, Spironolactone, Penicillins           Current Medications (08/27/2022):  This is the current hospital active medication list Current Facility-Administered Medications  Medication Dose Route Frequency Provider Last Rate Last Admin   acetaminophen (TYLENOL) tablet 650 mg  650 mg Oral Q6H PRN Mansy, Jan A, MD       Or   acetaminophen (TYLENOL) suppository 650 mg  650 mg Rectal Q6H PRN Mansy, Jan A, MD       acidophilus (RISAQUAD) capsule 1 capsule  1 capsule Oral Daily Mansy, Jan A, MD   1 capsule at 08/27/22 1131   aspirin chewable tablet 81 mg  81 mg Oral Daily Mansy, Jan A, MD   81 mg at 08/27/22 1131   cloNIDine (CATAPRES - Dosed in mg/24 hr) patch 0.1 mg  0.1 mg Transdermal Weekly Lorella Nimrod, MD       diclofenac Sodium (VOLTAREN)  1 % topical gel 2 g  2 g Topical QID PRN Mansy, Jan A, MD   2 g at 08/26/22 1847   diltiazem (CARDIZEM CD) 24 hr capsule 240 mg  240 mg Oral Daily Mansy, Jan A, MD   240 mg at 08/27/22 1131   donepezil (ARICEPT) tablet 10 mg  10 mg Oral QHS Mansy, Jan A, MD   10 mg at 08/26/22 2306   DULoxetine (CYMBALTA) DR capsule 30 mg  30 mg Oral BID Mansy, Jan A, MD   30 mg at 08/27/22 1131   enoxaparin (LOVENOX) injection 40 mg  40 mg Subcutaneous Q24H Mansy, Jan A, MD   40 mg at 08/27/22 4270   feeding supplement (ENSURE ENLIVE / ENSURE PLUS)  liquid 237 mL  237 mL Oral BID BM Lorella Nimrod, MD   237 mL at 08/27/22 1147   gabapentin (NEURONTIN) capsule 400 mg  400 mg Oral BID Mansy, Jan A, MD   400 mg at 08/27/22 1131   lactated ringers infusion   Intravenous Continuous Lorella Nimrod, MD 100 mL/hr at 08/27/22 1151 New Bag at 08/27/22 1151   magnesium hydroxide (MILK OF MAGNESIA) suspension 30 mL  30 mL Oral Daily PRN Mansy, Jan A, MD   30 mL at 08/25/22 1606   memantine (NAMENDA) tablet 10 mg  10 mg Oral BID Mansy, Jan A, MD   10 mg at 08/27/22 1131   multivitamin with minerals tablet 1 tablet  1 tablet Oral Daily Mansy, Jan A, MD   1 tablet at 08/27/22 1131   omega-3 acid ethyl esters (LOVAZA) capsule 1 g  1 g Oral Daily Mansy, Jan A, MD   1 g at 08/24/22 1015   ondansetron (ZOFRAN) tablet 4 mg  4 mg Oral Q6H PRN Mansy, Jan A, MD       Or   ondansetron [facility]) injection 4 mg  4 mg Intravenous Q6H PRN Mansy, Jan A, MD       oxyCODONE-acetaminophen (PERCOCET/ROXICET) 5-325 MG per tablet 1 tablet  1 tablet Oral Q4H PRN Lorella Nimrod, MD   1 tablet at 08/26/22 1835   polyethylene glycol (MIRALAX / GLYCOLAX) packet 17 g  17 g Oral Daily Lorella Nimrod, MD   17 g at 08/26/22 2046   polyvinyl alcohol (LIQUIFILM TEARS) 1.4 % ophthalmic solution 1 drop  1 drop Both Eyes Daily PRN Mansy, Jan A, MD       traZODone (DESYREL) tablet 25 mg  25 mg Oral QHS PRN Mansy, Arvella Merles, MD         Discharge Medications: Please see discharge summary for a list of discharge medications.  Relevant Imaging Results:  Relevant Lab Results:   Additional Information SS# [id number]  Izola Price, RN

## 2022-08-27 NOTE — Progress Notes (Signed)
Physical Therapy Treatment Patient Details Name: Kristin [name] MRN: [id number] DOB: [birth year] Today's Date: 08/27/2022   History of Present Illness 82 y/o female presented to ED on 08/23/22 following fall. Sustained minimally displcaed R superior/inferior pubic rami fxs. Orthopedics treating non-operatively. 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  Asleep but does open eyes briefly.  Participated in exercises as described below.  Pt does resist R LE but is able to assist minimally with L today.  She resists repositioning or attempts to transition to EOB.   Recommendations for follow up therapy are one component of a multi-disciplinary discharge planning process, led by the attending physician.  Recommendations may be updated based on patient status, additional functional criteria and insurance authorization.  Follow Up Recommendations  Skilled nursing-short term rehab (<3 hours/day) Can patient physically be transported by private vehicle: No   Assistance Recommended at Discharge Frequent or constant Supervision/Assistance  Patient can return home with the following Two people to help with walking and/or transfers;A lot of help with bathing/dressing/bathroom;Assistance with cooking/housework   Equipment Recommendations  Rolling walker (2 wheels);BSC/3in1;Wheelchair (measurements PT);Wheelchair cushion (measurements PT)    Recommendations for Other Services       Precautions / Restrictions Precautions Precautions: Fall Restrictions Weight Bearing Restrictions: Yes RLE Weight Bearing: Weight bearing as tolerated     Mobility  Bed Mobility Overal bed mobility: Needs Assistance Bed Mobility: Rolling Rolling: Total assist              Transfers                        Ambulation/Gait                   Stairs             Wheelchair Mobility    Modified Rankin (Stroke Patients Only)        Balance                                            Cognition Arousal/Alertness: Lethargic Behavior During Therapy: Flat affect Overall Cognitive Status: History of cognitive impairments - at baseline                                          Exercises Other Exercises Other Exercises: BLE AA/PROM - resists R LE but does assist some with L LE    General Comments        Pertinent Vitals/Pain Pain Assessment Pain Assessment: Faces Faces Pain Scale: Hurts whole lot Pain Location: R hip with movement Pain Descriptors / Indicators: Grimacing, Guarding Pain Intervention(s): Limited activity within patient's tolerance, Monitored during session, Repositioned    Home Living                          Prior Function            PT Goals (current goals can now be found in the care plan section) Progress towards PT goals: Progressing toward goals    Frequency    7X/week      PT Plan Current plan remains appropriate    Co-evaluation  AM-PAC PT "6 Clicks" Mobility   Outcome Measure  Help needed turning from your back to your side while in a flat bed without using bedrails?: Total Help needed moving from lying on your back to sitting on the side of a flat bed without using bedrails?: Total Help needed moving to and from a bed to a chair (including a wheelchair)?: Total Help needed standing up from a chair using your arms (e.g., wheelchair or bedside chair)?: Total Help needed to walk in hospital room?: Total Help needed climbing 3-5 steps with a railing? : Total 6 Click Score: 6    End of Session Equipment Utilized During Treatment: Gait belt Activity Tolerance: Patient limited by pain;Patient limited by fatigue Patient left: in bed;with call bell/phone within reach;with bed alarm set Nurse Communication: Mobility status PT Visit Diagnosis: Unsteadiness on feet (R26.81);Muscle weakness (generalized)  (M62.81);Difficulty in walking, not elsewhere classified (R26.2);Other abnormalities of gait and mobility (R26.89);History of falling (Z91.81)     Time: 9169-4503 PT Time Calculation (min) (ACUTE ONLY): 8 min  Charges:  $Therapeutic Exercise: 8-22 mins                   Chesley Noon, PTA 08/27/22, 10:45 AM

## 2022-08-27 NOTE — Plan of Care (Signed)
  Problem: Activity: Goal: Risk for activity intolerance will decrease Outcome: Progressing   Problem: Nutrition: Goal: Adequate nutrition will be maintained Outcome: Progressing   Problem: Elimination: Goal: Will not experience complications related to bowel motility Outcome: Progressing Goal: Will not experience complications related to urinary retention Outcome: Progressing   

## 2022-08-27 NOTE — Progress Notes (Signed)
Progress Note   Patient: Kristin [name] GTX:[id number] DOB: Jul 27, 1941 DOA: 08/23/2022     3 DOS: the patient was seen and examined on 08/27/2022   Brief hospital course: Taken from H&P.  [name] is a 82 y.o.Caucasian female with medical history significant for Coronary artery disease, fibromyalgia, DVT, [name], osteoarthritis, rheumatoid arthritis, remote subarachnoid hemorrhage due to ruptured aneurysm, who presented to the emergency room with mechanical fall with subsequent right hip pain and inability to ambulate.     ED Course: Upon presentation to the emergency room, BP was 163/67 with otherwise normal vital signs. BMP revealed hypokalemia of 3.1 and a creatinine 1.17 close to baseline and CBC showed no leukocytosis of 11.8.  Influenza antigens and COVID-19 PCR as well as RSV PCR came back negative. EKG as reviewed by me : EKG showed normal sinus rhythm with a rate of 60. Imaging: 2 view chest x-ray showed blunting of the left lateral CP angle that may be due to minimal pleural effusion or pleural thickening with small linear densities in the lateral aspect of the left lower lung fields that may suggest scarring or subsegmental atelectasis. DG hip with cortical irregularity in the upper margin of right pubic bone and at the junction of right inferior pubic ramus and ischium suggesting possible undisplaced recent or old fracture. CT head and cervical spine was negative for any acute abnormality or fracture. DG lumbar spine and bilateral knees were negative for any acute fractures or injury.  1/11: Blood pressure elevated at 165/72.  Labs with resolution of leukocytosis and hypokalemia.  Some improvement of creatinine to 1.11, remained within her baseline. PT/OT ordered-recommending SNF Orthopedic consult -recommending conservative management and she will be weightbearing as tolerated.  1/12: Blood pressure remained elevated.  Patient is pretty much allergic to most of other  antihypertensives and currently on Cardizem only.  Pain can be contributory. Awaiting placement.  1/13: Vital stable with mildly elevated blood pressure.  Awaiting SNF placement  1/14: Blood pressure remained mildly elevated.  Patient had in and out catheter once yesterday for concern of urinary retention as postvoid volume was above 500 mL.  Total UOP recorded 968. Appetite and p.o. intake remained poor.  Ordered clonidine patch to see if that will help with blood pressure due to her extensive allergy list. Still no workup done in terms of placement by TOC.  Patient is high risk for deterioration and mortality based on advanced dementia, poor p.o. intake and underlying comorbidities.  Palliative care was consulted  Assessment and Plan: * Pelvic ring fracture ([name]) - The patient has a right pubic ramus fracture secondary to mechanical fall. Orthopedic surgery is recommending conservative management and she will be weightbearing as tolerated. -Continue with pain management. - PT is recommending SNF.    Hypokalemia Resolved with repletion.  Magnesium within normal limit  Essential hypertension Blood pressure remained elevated.  Apparently allergic to a lot of antihypertensives.  She was only on Cardizem to 40 mg at home. 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  Peripheral neuropathy - We will continue Neurontin.    Subjective: Patient was seen and examined today.  Stating that pain still bothers her.  Breakfast was sitting next to her bed, per patient she is feeling little hungry but does not want to eat??  Physical Exam: Vitals:   08/26/22 1706 08/26/22 2309 08/27/22 0135 08/27/22 0808  BP: [name])  179/62 (!) 175/82 (!) 162/76 (!) 177/59  Pulse: 68 74 75 73  Resp: '20 18  20  '$ Temp: 98.4 F (36.9 C) 98.2 F (36.8 C)  97.9 F (36.6 C)  TempSrc:     Oral  SpO2: 92% 94% 94% 99%  Weight:      Height:       General.  Chronically ill-appearing, frail elderly lady, in no acute distress. Pulmonary.  Lungs clear bilaterally, normal respiratory effort. CV.  Regular rate and rhythm, no JVD, rub or murmur. Abdomen.  Soft, nontender, nondistended, BS positive. CNS.  Alert and oriented .  No focal neurologic deficit. Extremities.  No edema, no cyanosis, pulses intact and symmetrical. Psychiatry.  Judgment and insight appears impaired  Data Reviewed: Prior data reviewed  Family Communication: Discussed with husband and son on phone.  Disposition: Status is: Inpatient Remains inpatient appropriate because: Severity of illness  Planned Discharge Destination: Skilled nursing facility  DVT prophylaxis.  Lovenox Time spent: 40 minutes  This record has been created using Systems analyst. Errors have been sought and corrected,but may not always be located. Such creation errors do not reflect on the standard of care.   Author: Lorella Nimrod, MD 08/27/2022 1:16 PM  For on call review [email or web address].

## 2022-08-27 NOTE — Assessment & Plan Note (Signed)
Blood pressure remained elevated.  Apparently allergic to a lot of antihypertensives.  She was only on Cardizem to 40 mg at home. -Add clonidine patch 0.1 mg -Continue home Cardizem -Continue to monitor

## 2022-08-28 DIAGNOSIS — S32810A Multiple fractures of pelvis with stable disruption of pelvic ring, initial encounter for closed fracture: Secondary | ICD-10-CM | POA: Diagnosis not present

## 2022-08-28 LAB — BASIC METABOLIC PANEL
Anion gap: 8 (ref 5–15)
BUN: 23 mg/dL (ref 8–23)
CO2: 27 mmol/L (ref 22–32)
Calcium: 8.7 mg/dL — ABNORMAL LOW (ref 8.9–10.3)
Chloride: 109 mmol/L (ref 98–111)
Creatinine, Ser: 0.79 mg/dL (ref 0.44–1.00)
GFR, Estimated: >60 mL/min (ref >60)
Glucose, Bld: 114 mg/dL — ABNORMAL HIGH (ref 70–99)
Potassium: 3.1 mmol/L — ABNORMAL LOW (ref 3.5–5.1)
Sodium: 144 mmol/L (ref 135–145)

## 2022-08-28 LAB — CBC
HCT: 32.8 % — ABNORMAL LOW (ref 36.0–46.0)
Hemoglobin: 11 g/dL — ABNORMAL LOW (ref 12.0–15.0)
MCH: 31.9 pg (ref 26.0–34.0)
MCHC: 33.5 g/dL (ref 30.0–36.0)
MCV: 95.1 fL (ref 80.0–100.0)
Platelets: 199 10*3/uL (ref 150–400)
RBC: 3.45 MIL/uL — ABNORMAL LOW (ref 3.87–5.11)
RDW: 14.5 % (ref 11.5–15.5)
WBC: 7.9 10*3/uL (ref 4.0–10.5)
nRBC: 0 % (ref 0.0–0.2)

## 2022-08-28 MED ORDER — CLONIDINE HCL 0.2 MG/24HR TD PTWK: 0.2000 mg | MEDICATED_PATCH | TRANSDERMAL | Status: DC

## 2022-08-28 MED ORDER — POTASSIUM CHLORIDE 20 MEQ PO PACK
40.0000 meq | PACK | Freq: Once | ORAL | Status: AC
  Administered 2022-08-28: 40 meq via ORAL
  Filled 2022-08-28: qty 2

## 2022-08-28 MED ORDER — ENSURE ENLIVE PO LIQD
237.0000 mL | Freq: Three times a day (TID) | ORAL | Status: DC
  Administered 2022-08-28 – 2022-08-31 (×8): 237 mL via ORAL

## 2022-08-28 NOTE — Plan of Care (Signed)
  Problem: Health Behavior/Discharge Planning: Goal: Ability to manage health-related needs will improve Outcome: Progressing   Problem: Activity: Goal: Risk for activity intolerance will decrease Outcome: Progressing   Problem: Nutrition: Goal: Adequate nutrition will be maintained Outcome: Progressing   Problem: Elimination: Goal: Will not experience complications related to bowel motility Outcome: Progressing Goal: Will not experience complications related to urinary retention Outcome: Progressing   Problem: Safety: Goal: Ability to remain free from injury will improve Outcome: Progressing   Problem: Skin Integrity: Goal: Risk for impaired skin integrity will decrease Outcome: Progressing

## 2022-08-28 NOTE — Progress Notes (Signed)
Physical Therapy Treatment Patient Details Name: Kristin [name] MRN: [id number] DOB: Apr 30, 1941 Today's Date: 08/28/2022   History of Present Illness 82 y/o female presented to ED on 08/23/22 following fall. Sustained minimally displcaed R superior/inferior pubic rami fxs. Orthopedics treating non-operatively. 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  Attempted this am but pt with very poor tolerance and barely tolerating ankle pumps.  Requested pain meds from nursing.  Returned after lunch and she does seem to have better pain control.  Is able to tolerate some increased PROM BLE's and is able to transition to sitting with max/dependant care to EOB.  Leans left but she does want to try to stand.  Stood x 2 with RW and max a x 2.  She stands briefly with stooped posture.  She is unable to take any steps or march in place but overall does tolerate standing better than expected.  Returns to bed with dependant assist.   Recommendations for follow up therapy are one component of a multi-disciplinary discharge planning process, led by the attending physician.  Recommendations may be updated based on patient status, additional functional criteria and insurance authorization.  Follow Up Recommendations  Skilled nursing-short term rehab (<3 hours/day) Can patient physically be transported by private vehicle: No   Assistance Recommended at Discharge Frequent or constant Supervision/Assistance  Patient can return home with the following Two people to help with walking and/or transfers;A lot of help with bathing/dressing/bathroom;Assistance with cooking/housework   Equipment Recommendations  Rolling walker (2 wheels);BSC/3in1;Wheelchair (measurements PT);Wheelchair cushion (measurements PT)    Recommendations for Other Services       Precautions / Restrictions Precautions Precautions: Fall Restrictions Weight Bearing Restrictions: Yes RLE  Weight Bearing: Weight bearing as tolerated     Mobility  Bed Mobility Overal bed mobility: Needs Assistance       Supine to sit: +2 for physical assistance, HOB elevated, Total assist Sit to supine: Total assist, +2 for physical assistance   General bed mobility comments: dependant for transitions +2 needed for comfort    Transfers Overall transfer level: Needs assistance Equipment used: Rolling walker (2 wheels) Transfers: Sit to/from Stand Sit to Stand: +2 physical assistance, +2 safety/equipment, Max assist           General transfer comment: stood x 2 at bedside but unable to stand fully upright or take any steps    Ambulation/Gait               General Gait Details: attempted side stepping along bed but pt unable to weight shift sufficiently.   Stairs             Wheelchair Mobility    Modified Rankin (Stroke Patients Only)       Balance Overall balance assessment: Needs assistance Sitting-balance support: Single extremity supported, Feet supported Sitting balance-Leahy Scale: Poor Sitting balance - Comments: +1 assist to remain upright and for safety     Standing balance-Leahy Scale: Zero Standing balance comment: standing with RW; bilateral UE support; cues for LE extension and upright gaze. +2 needed                            Cognition Arousal/Alertness: Awake/alert Behavior During Therapy: Flat affect Overall Cognitive Status: History of cognitive impairments - at baseline  General Comments: more engaged this session but does not offer conversation unless promoted.        Exercises Other Exercises Other Exercises: BLE AA/PROM - resists R LE but does assist some with L LE this pm.  pain limited this am to only trace movements    General Comments        Pertinent Vitals/Pain Pain Assessment Pain Assessment: Faces Faces Pain Scale: Hurts even more Pain Location: BUEs  with movement, R hip Pain Descriptors / Indicators: Grimacing, Guarding Pain Intervention(s): Limited activity within patient's tolerance, Monitored during session, Repositioned, Premedicated before session    Home Living                          Prior Function            PT Goals (current goals can now be found in the care plan section)      Frequency    7X/week      PT Plan Current plan remains appropriate    Co-evaluation              AM-PAC PT "6 Clicks" Mobility   Outcome Measure  Help needed turning from your back to your side while in a flat bed without using bedrails?: Total Help needed moving from lying on your back to sitting on the side of a flat bed without using bedrails?: Total Help needed moving to and from a bed to a chair (including a wheelchair)?: Total Help needed standing up from a chair using your arms (e.g., wheelchair or bedside chair)?: Total Help needed to walk in hospital room?: Total Help needed climbing 3-5 steps with a railing? : Total 6 Click Score: 6    End of Session Equipment Utilized During Treatment: Gait belt Activity Tolerance: Patient limited by pain;Patient limited by fatigue Patient left: in bed;with call bell/phone within reach;with bed alarm set Nurse Communication: Mobility status PT Visit Diagnosis: Unsteadiness on feet (R26.81);Muscle weakness (generalized) (M62.81);Difficulty in walking, not elsewhere classified (R26.2);Other abnormalities of gait and mobility (R26.89);History of falling (Z91.81)     Time: 1351-1415 PT Time Calculation (min) (ACUTE ONLY): 24 min  Charges:  $Therapeutic Exercise: 8-22 mins $Therapeutic Activity: 8-22 mins                   Chesley Noon, PTA 08/28/22, 2:28 PM

## 2022-08-28 NOTE — Plan of Care (Signed)

## 2022-08-28 NOTE — Progress Notes (Signed)
Initial Nutrition Assessment  DOCUMENTATION CODES:   Not applicable  INTERVENTION:   -Ensure Enlive po TID, each supplement provides 350 kcal and 20 grams of protein -MVI with minerals daily -Feeding assistance with meals -Downgrade diet to dysphagia 3 diet with thin liquids for ease of intake  NUTRITION DIAGNOSIS:   Inadequate oral intake related to poor appetite as evidenced by per patient/family report.  GOAL:   Patient will meet greater than or equal to 90% of their needs  MONITOR:   PO intake, Supplement acceptance  REASON FOR ASSESSMENT:   Consult Assessment of nutrition requirement/status, Poor PO  ASSESSMENT:   Pt with medical history significant for Coronary artery disease, fibromyalgia, DVT, [name], osteoarthritis, rheumatoid arthritis, remote subarachnoid hemorrhage due to ruptured aneurysm, who presented with mechanical fall with subsequent right hip pain and inability to ambulate.  Pt admitted with pelvic fracture.   Reviewed I/O's: -392 ml x 24 hours and -2 L since admission  UOP: 850 ml x 24 hours  Per orthopedics notes, no plans for surgery.   RD consulted for poor oral intake. Noted meal completion 10-25%.   Pt sitting up in bed, being fed by nurse tech today. Per nurse tech, pt is accepting PO's today. Noted pt consuming oatmeal at time of visit. Pt consumed a few bites of oatmeal at time of visit. Per RN, pt also took her medications crushed in applesauce. Pt unable to provide history secondary to dementia.   Reviewed wt hx; no wt loss noted over the past 9 months.   Palliative care consulted for goals of care discussions.   Medications reviewed and include cardizem, lovaza, and miralax.   Per TOC notes, plan for SNF at discharge.   Labs reviewed: K: 3.1.    NUTRITION - FOCUSED PHYSICAL EXAM:  Flowsheet Row Most Recent Value  Orbital Region No depletion  Upper Arm Region No depletion  Thoracic and Lumbar Region No depletion  Buccal Region  No depletion  Temple Region Mild depletion  Clavicle Bone Region No depletion  Clavicle and Acromion Bone Region No depletion  Scapular Bone Region No depletion  Dorsal Hand No depletion  Patellar Region No depletion  Anterior Thigh Region No depletion  Posterior Calf Region No depletion  Edema (RD Assessment) Mild  Hair Reviewed  Eyes Reviewed  Mouth Reviewed  Skin Reviewed  Nails Reviewed       Diet Order:   Diet Order             Diet Heart Room service appropriate? Yes; Fluid consistency: Thin  Diet effective now                   EDUCATION NEEDS:   No education needs have been identified at this time  Skin:  Skin Assessment: Reviewed RN Assessment  Last BM:  Unknown  Height:   Ht Readings from Last 1 Encounters:  08/23/22 '5\' 7"'$  (1.702 m)    Weight:   Wt Readings from Last 1 Encounters:  08/23/22 67 kg    Ideal Body Weight:  61.4 kg  BMI:  Body mass index is 23.13 kg/m.  Estimated Nutritional Needs:   Kcal:  1650-1850  Protein:  85-100 grams  Fluid:  > 1.6 L    Loistine Chance, RD, LDN, [place] Registered Dietitian II Certified Diabetes Care and Education Specialist Please refer to [facility] for RD and/or RD on-call/weekend/after hours pager

## 2022-08-28 NOTE — Progress Notes (Signed)
Occupational Therapy Treatment Patient Details Name: Kristin [name] MRN: [id number] DOB: 15-Oct-1940 Today's Date: 08/28/2022   History of present illness 82 y/o female presented to ED on 08/23/22 following fall. Sustained minimally displcaed R superior/inferior pubic rami fxs. Orthopedics treating non-operatively. PMH: [name], fibromyalgia, RA, remote SAH due to ruptured aneurysm   OT comments  Patient received supine in bed. Pt endorsing significant pain in R hip. She deferred OOB mobility this date, however, was agreeable to bed level BUE AAROM exercises (see details below) and grooming tasks. Pt then engaging in self-feeding while sitting up in bed. RN present at end of session to give pain meds. Pt left as received with all needs in reach. Pt is making progress toward goal completion. D/C recommendation remains appropriate. OT will continue to follow acutely.    Recommendations for follow up therapy are one component of a multi-disciplinary discharge planning process, led by the attending physician.  Recommendations may be updated based on patient status, additional functional criteria and insurance authorization.    Follow Up Recommendations  Skilled nursing-short term rehab (<3 hours/day)     Assistance Recommended at Discharge Frequent or constant Supervision/Assistance  Patient can return home with the following  Two people to help with walking and/or transfers;Two people to help with bathing/dressing/bathroom;Help with stairs or ramp for entrance   Equipment Recommendations  BSC/3in1    Recommendations for Other Services      Precautions / Restrictions Precautions Precautions: Fall Restrictions Weight Bearing Restrictions: Yes RLE Weight Bearing: Weight bearing as tolerated       Mobility Bed Mobility Overal bed mobility: Needs Assistance             General bed mobility comments: attempting bed mobility, however, pt in significant amount of pain and unable to  complete this date    Transfers Overall transfer level: Needs assistance                 General transfer comment: deferred     Balance Overall balance assessment: Needs assistance     Sitting balance - Comments: pt deferred 2/2 R hip pain                                   ADL either performed or assessed with clinical judgement   ADL Overall ADL's : Needs assistance/impaired Eating/Feeding: Bed level;Moderate assistance Eating/Feeding Details (indicate cue type and reason): assist to cut up food, able to hold utensil with R hand, assist for getting piece of pancake on fork, able to bring RUE partially toward mouth (very slowly) with assist for follow through Grooming: Bed level;Moderate assistance;Brushing hair;Wash/dry face                                      Extremity/Trunk Assessment Upper Extremity Assessment Upper Extremity Assessment: Generalized weakness   Lower Extremity Assessment Lower Extremity Assessment: Generalized weakness        Vision Patient Visual Report: No change from baseline     Perception     Praxis      Cognition Arousal/Alertness: Lethargic Behavior During Therapy: Flat affect Overall Cognitive Status: History of cognitive impairments - at baseline  General Comments: Delayed responses t/o session. Follows singe step commands with repetition and tactile cues        Exercises General Exercises - Upper Extremity Shoulder Flexion: AAROM, Both, 5 reps, Supine Elbow Flexion: AAROM, Both, 5 reps, Supine Elbow Extension: AAROM, Both, 5 reps, Supine Wrist Flexion: AAROM, Both, 5 reps, Supine Wrist Extension: AAROM, Both, 5 reps, Supine Digit Composite Flexion: AAROM, Both, 5 reps, Supine Composite Extension: AAROM, Both, 5 reps, Supine    Shoulder Instructions       General Comments      Pertinent Vitals/ Pain       Pain Assessment Pain Assessment:  Faces Faces Pain Scale: Hurts whole lot Pain Location: BUEs with movement, R hip Pain Descriptors / Indicators: Grimacing, Guarding Pain Intervention(s): Limited activity within patient's tolerance, Monitored during session, Repositioned  Home Living                                          Prior Functioning/Environment              Frequency  Min 2X/week        Progress Toward Goals  OT Goals(current goals can now be found in the care plan section)  Progress towards OT goals: Progressing toward goals  Acute Rehab OT Goals Patient Stated Goal: to walk OT Goal Formulation: With patient Time For Goal Achievement: 09/07/22 Potential to Achieve Goals: Good  Plan Discharge plan remains appropriate;Frequency remains appropriate    Co-evaluation                 AM-PAC OT "6 Clicks" Daily Activity     Outcome Measure   Help from another person eating meals?: A Lot Help from another person taking care of personal grooming?: A Lot Help from another person toileting, which includes using toliet, bedpan, or urinal?: A Lot Help from another person bathing (including washing, rinsing, drying)?: A Lot Help from another person to put on and taking off regular upper body clothing?: A Little Help from another person to put on and taking off regular lower body clothing?: A Lot 6 Click Score: 13    End of Session    OT Visit Diagnosis: Other abnormalities of gait and mobility (R26.89);Muscle weakness (generalized) (M62.81)   Activity Tolerance Patient limited by fatigue;Patient limited by pain   Patient Left in bed;with call bell/phone within reach;with bed alarm set   Nurse Communication Mobility status;Patient requests pain meds        Time: 7048-8891 OT Time Calculation (min): 23 min  Charges: OT General Charges $OT Visit: 1 Visit OT Treatments $Self Care/Home Management : 8-22 mins $Therapeutic Exercise: 8-22 mins  [facility] MS,  OTR/L ascom [phone]  08/28/22, 2:27 PM

## 2022-08-28 NOTE — TOC Progression Note (Signed)
Transition of Care [facility]) - Progression Note    Patient Details  Name: Kristin [name] MRN: [id number] Date of Birth: 1940-09-29  Transition of [facility]) CM/SW Contact  Beverly Sessions, RN Phone Number: 08/28/2022, 9:43 AM  Clinical Narrative:     Spoke with husband [name] by phone.  Provided bed offers.  He accepts bed at [facility].  Accepted in [place].  Notified Tanya at [facility] Level 2 PASRR pending.  Will need insurance auth. Message sent to MD to determine when patient medically ready for discharge   Expected Discharge Plan: [place] Barriers to Discharge: Continued Medical Work up  Expected Discharge Plan and Services     Post Acute Care Choice:  (unknown at this time) Living arrangements for the past 2 months: Single Family Home                                       Social Determinants of Health (SDOH) Interventions SDOH Screenings   Food Insecurity: No Food Insecurity (08/24/2022)  Housing: Low Risk  (08/24/2022)  Transportation Needs: No Transportation Needs (08/24/2022)  Utilities: Not At Risk (08/24/2022)  Alcohol Screen: Low Risk  (09/21/2021)  Depression (PHQ2-9): Low Risk  (01/11/2022)  Financial Resource Strain: Low Risk  (06/13/2021)  Physical Activity: Insufficiently Active (06/13/2021)  Social Connections: Moderately Isolated (06/13/2021)  Stress: No Stress Concern Present (01/11/2022)  Tobacco Use: Low Risk  (08/23/2022)    Readmission Risk Interventions     No data to display

## 2022-08-28 NOTE — TOC Progression Note (Signed)
Transition of Care [facility]) - Progression Note    Patient Details  Name: Kristin [name] MRN: [id number] Date of Birth: [birth year]  Transition of Care [facility]) CM/SW Contact  Beverly Sessions, RN Phone Number: 08/28/2022, 9:52 AM  Clinical Narrative:    Per MD patient medically stable for discharge Spoke with [place] at [facility] and [facility] for SNF [facility] and [facility] transport    Expected Discharge Plan: [place] Barriers to Discharge: Continued Medical Work up  Expected Discharge Plan and Services     Post Acute Care Choice:  (unknown at this time) Living arrangements for the past 2 months: Single Family Home                                       Social Determinants of Health (SDOH) Interventions SDOH Screenings   Food Insecurity: No Food Insecurity (08/24/2022)  Housing: Low Risk  (08/24/2022)  Transportation Needs: No Transportation Needs (08/24/2022)  Utilities: Not At Risk (08/24/2022)  Alcohol Screen: Low Risk  (09/21/2021)  Depression (PHQ2-9): Low Risk  (01/11/2022)  Financial Resource Strain: Low Risk  (06/13/2021)  Physical Activity: Insufficiently Active (06/13/2021)  Social Connections: Moderately Isolated (06/13/2021)  Stress: No Stress Concern Present (01/11/2022)  Tobacco Use: Low Risk  (08/23/2022)    Readmission Risk Interventions     No data to display

## 2022-08-28 NOTE — Progress Notes (Signed)
Progress Note   Patient: Kristin [name] CWC:[id number] DOB: April 09, 1941 DOA: 08/23/2022     4 DOS: the patient was seen and examined on 08/28/2022   Brief hospital course: Taken from H&P.  Kristin [name] is a 82 y.o.Caucasian female with medical history significant for Coronary artery disease, fibromyalgia, DVT, [name], osteoarthritis, rheumatoid arthritis, remote subarachnoid hemorrhage due to ruptured aneurysm, who presented to the emergency room with mechanical fall with subsequent right hip pain and inability to ambulate.     ED Course: Upon presentation to the emergency room, BP was 163/67 with otherwise normal vital signs. BMP revealed hypokalemia of 3.1 and a creatinine 1.17 close to baseline and CBC showed no leukocytosis of 11.8.  Influenza antigens and COVID-19 PCR as well as RSV PCR came back negative. EKG as reviewed by me : EKG showed normal sinus rhythm with a rate of 60. Imaging: 2 view chest x-ray showed blunting of the left lateral CP angle that may be due to minimal pleural effusion or pleural thickening with small linear densities in the lateral aspect of the left lower lung fields that may suggest scarring or subsegmental atelectasis. DG hip with cortical irregularity in the upper margin of right pubic bone and at the junction of right inferior pubic ramus and ischium suggesting possible undisplaced recent or old fracture. CT head and cervical spine was negative for any acute abnormality or fracture. DG lumbar spine and bilateral knees were negative for any acute fractures or injury.  1/11: Blood pressure elevated at 165/72.  Labs with resolution of leukocytosis and hypokalemia.  Some improvement of creatinine to 1.11, remained within her baseline. PT/OT ordered-recommending SNF Orthopedic consult -recommending conservative management and she will be weightbearing as tolerated.  1/12: Blood pressure remained elevated.  Patient is pretty much allergic to most of other  antihypertensives and currently on Cardizem only.  Pain can be contributory. Awaiting placement.  1/13: Vital stable with mildly elevated blood pressure.  Awaiting SNF placement  1/14: Blood pressure remained mildly elevated.  Patient had in and out catheter once yesterday for concern of urinary retention as postvoid volume was above 500 mL.  Total UOP recorded 968. Appetite and p.o. intake remained poor.  Ordered clonidine patch to see if that will help with blood pressure due to her extensive allergy list. Still no workup done in terms of placement by prior TOC-we can TOC Kristin [name] started the process.  1/15: Blood pressure remained elevated, increasing the dose of clonidine to 0.2 mg.  Labs with mild hypokalemia, potassium at 3.1, creatinine has been normalized.  Slight decrease in hemoglobin to 11 but all cell lines decreased.  Patient also having incomplete bladder emptying, pain with pelvic rami fracture can be contributory.  We will try to encourage voiding well to avoid Foley catheter placement which can be done as last resort.  Pending SNF placement.  Patient is high risk for deterioration and mortality based on advanced dementia, poor p.o. intake and underlying comorbidities.  Palliative care was consulted  Assessment and Plan: * Pelvic ring fracture ([place]) - The patient has a right pubic ramus fracture secondary to mechanical fall. Orthopedic surgery is recommending conservative management and she will be weightbearing as tolerated. -Continue with pain management. - PT is recommending SNF.    Hypokalemia Potassium again decreased to 3.1, magnesium within normal limit earlier -Replete potassium -Continue to monitor  Essential hypertension Blood pressure remained elevated.  Apparently allergic to a lot of antihypertensives.  She was only on Cardizem  to 40 mg at home. -Add clonidine patch 0.1 mg -Continue home Cardizem -Continue to monitor  Arteriosclerotic dementia with  depression ([name]) - We will continue Aricept, Namenda and Cymbalta.  Peripheral neuropathy - We will continue Neurontin.   Subjective: Patient was seen and examined today.  Nursing concern of incomplete voiding required 1 time in and out catheter overnight.  Still having some pelvic pain.  P.o. intake remained poor.  Physical Exam: Vitals:   08/27/22 0808 08/27/22 1651 08/27/22 2328 08/28/22 0813  BP: (!) 177/59 (!) 165/70 (!) 160/70 (!) 186/72  Pulse: 73 86 80 77  Resp: '20 18 20 16  '$ Temp: 97.9 F (36.6 C) 98.1 F (36.7 C) 98 F (36.7 C) 97.6 F (36.4 C)  TempSrc: Oral     SpO2: 99% 94% 93% 96%  Weight:      Height:       General.  Frail elderly lady, in no acute distress. Pulmonary.  Lungs clear bilaterally, normal respiratory effort. CV.  Regular rate and rhythm, no JVD, rub or murmur. Abdomen.  Soft, nontender, nondistended, BS positive. CNS.  Alert and oriented .  No focal neurologic deficit. Extremities.  No edema, no cyanosis, pulses intact and symmetrical. Psychiatry.  Judgment and insight appears impaired.  Data Reviewed: Prior data reviewed  Family Communication: Discussed with husband on phone.  Disposition: Status is: Inpatient Remains inpatient appropriate because: Severity of illness  Planned Discharge Destination: Skilled nursing facility  DVT prophylaxis.  Lovenox Time spent: 39 minutes  This record has been created using Systems analyst. Errors have been sought and corrected,but may not always be located. Such creation errors do not reflect on the standard of care.   Author: Lorella Nimrod, MD 08/28/2022 1:37 PM  For on call review [email or web address].

## 2022-08-28 NOTE — Care Management Important Message (Signed)
Important Message  Patient Details  Name: Kristin [name] MRN: [id number] Date of Birth: [birth year]   Medicare Important Message Given:  Yes     Kristin [name] 08/28/2022, 2:03 PM

## 2022-08-29 DIAGNOSIS — F0390 Unspecified dementia without behavioral disturbance: Secondary | ICD-10-CM

## 2022-08-29 DIAGNOSIS — S32810D Multiple fractures of pelvis with stable disruption of pelvic ring, subsequent encounter for fracture with routine healing: Secondary | ICD-10-CM

## 2022-08-29 DIAGNOSIS — Z515 Encounter for palliative care: Secondary | ICD-10-CM | POA: Diagnosis not present

## 2022-08-29 DIAGNOSIS — Z7189 Other specified counseling: Secondary | ICD-10-CM | POA: Diagnosis not present

## 2022-08-29 DIAGNOSIS — S32810A Multiple fractures of pelvis with stable disruption of pelvic ring, initial encounter for closed fracture: Secondary | ICD-10-CM | POA: Diagnosis not present

## 2022-08-29 LAB — POTASSIUM: Potassium: 3.1 mmol/L — ABNORMAL LOW (ref 3.5–5.1)

## 2022-08-29 LAB — MAGNESIUM: Magnesium: 2.4 mg/dL (ref 1.7–2.4)

## 2022-08-29 MED ORDER — POTASSIUM CHLORIDE 20 MEQ PO PACK
40.0000 meq | PACK | Freq: Once | ORAL | Status: AC
  Administered 2022-08-29: 40 meq via ORAL
  Filled 2022-08-29: qty 2

## 2022-08-29 MED ORDER — CLONIDINE HCL 0.1 MG PO TABS
0.2000 mg | ORAL_TABLET | Freq: Three times a day (TID) | ORAL | Status: DC
  Administered 2022-08-29 – 2022-08-31 (×7): 0.2 mg via ORAL
  Filled 2022-08-29 (×7): qty 2

## 2022-08-29 NOTE — Progress Notes (Signed)
Occupational Therapy Treatment Patient Details Name: Kristin [name] MRN: [id number] DOB: 1940-08-19 Today's Date: 08/29/2022   History of present illness 82 y/o female presented to ED on 08/23/22 following fall. Sustained minimally displcaed R superior/inferior pubic rami fxs. Orthopedics treating non-operatively. PMH: [name], fibromyalgia, RA, remote SAH due to ruptured aneurysm   OT comments  Upon entering session, pt sitting in recliner and agreeable to OT. Pt inconsistently followed single step commands and required Max multimodal cues for initiation/safety/sequencing of mobility this date. She engaged in seated grooming tasks with Min-Mod A. Squat pivot transfer completed from recliner>EOB with Max A +2 via HHA. Once returned to supine, pt required physical assistance to reposition self in bed and change soiled gown. Pt left semi-reclined in bed with all needs in reach. Pt is making progress toward goal completion. D/C recommendation remains appropriate. OT will continue to follow acutely.    Recommendations for follow up therapy are one component of a multi-disciplinary discharge planning process, led by the attending physician.  Recommendations may be updated based on patient status, additional functional criteria and insurance authorization.    Follow Up Recommendations  Skilled nursing-short term rehab (<3 hours/day)     Assistance Recommended at Discharge Frequent or constant Supervision/Assistance  Patient can return home with the following  Two people to help with walking and/or transfers;Two people to help with bathing/dressing/bathroom;Help with stairs or ramp for entrance   Equipment Recommendations  BSC/3in1    Recommendations for Other Services      Precautions / Restrictions Precautions Precautions: Fall Restrictions Weight Bearing Restrictions: Yes RLE Weight Bearing: Weight bearing as tolerated       Mobility Bed Mobility Overal bed mobility: Needs Assistance Bed  Mobility: Sit to Supine       Sit to supine: Max assist   General bed mobility comments: Max A to reposition trunk and BLEs in bed    Transfers Overall transfer level: Needs assistance Equipment used: 2 person hand held assist Transfers: Bed to chair/wheelchair/BSC Sit to Stand: +2 physical assistance   Squat pivot transfers: Max assist, +2 physical assistance       General transfer comment: Max A for scooting hips forward in recliner prior to transfer. Squat pivot transfer from recliner>EOB. Max multimodal cues for proper technique during transfer (anterior weight shift, hand placement). Pt unable to come to fully upright standing position despite VC and +2.     Balance Overall balance assessment: Needs assistance Sitting-balance support: Single extremity supported, Feet supported Sitting balance-Leahy Scale: Fair Sitting balance - Comments: unable to maintain midline posture, required assistance to correct L lateral lean Postural control: Left lateral lean Standing balance support: Bilateral upper extremity supported Standing balance-Leahy Scale: Poor                             ADL either performed or assessed with clinical judgement   ADL Overall ADL's : Needs assistance/impaired     Grooming: Sitting;Minimal assistance;Moderate assistance;Wash/dry face;Brushing hair Grooming Details (indicate cue type and reason): difficulty reaching L side of head for hair brushing, increased time required, VC to reach all areas of face during face washing         Upper Body Dressing : Maximal assistance;Bed level Upper Body Dressing Details (indicate cue type and reason): to don/doff gown                        Extremity/Trunk Assessment Upper Extremity  Assessment Upper Extremity Assessment: Generalized weakness   Lower Extremity Assessment Lower Extremity Assessment: Generalized weakness   Cervical / Trunk Assessment Cervical / Trunk Assessment:  Kyphotic    Vision Patient Visual Report: No change from baseline     Perception     Praxis      Cognition Arousal/Alertness: Awake/alert Behavior During Therapy: Flat affect Overall Cognitive Status: History of cognitive impairments - at baseline                                 General Comments: Inconsistently followed single step commands, increased time for processing & repetition required        Exercises      Shoulder Instructions       General Comments      Pertinent Vitals/ Pain       Pain Assessment Pain Assessment: Faces Faces Pain Scale: Hurts even more Pain Location: BUEs with movement, R hip Pain Descriptors / Indicators: Grimacing, Guarding Pain Intervention(s): Limited activity within patient's tolerance, Monitored during session, Repositioned  Home Living                                          Prior Functioning/Environment              Frequency  Min 2X/week        Progress Toward Goals  OT Goals(current goals can now be found in the care plan section)  Progress towards OT goals: Progressing toward goals  Acute Rehab OT Goals Patient Stated Goal: to walk OT Goal Formulation: With patient Time For Goal Achievement: 09/07/22 Potential to Achieve Goals: [place] Discharge plan remains appropriate;Frequency remains appropriate    Co-evaluation                 AM-PAC OT "6 Clicks" Daily Activity     Outcome Measure   Help from another person eating meals?: A Little Help from another person taking care of personal grooming?: A Lot Help from another person toileting, which includes using toliet, bedpan, or urinal?: A Lot Help from another person bathing (including washing, rinsing, drying)?: A Lot Help from another person to put on and taking off regular upper body clothing?: A Lot Help from another person to put on and taking off regular lower body clothing?: A Lot 6 Click Score: 13     End of Session Equipment Utilized During Treatment: Gait belt  OT Visit Diagnosis: Other abnormalities of gait and mobility (R26.89);Muscle weakness (generalized) (M62.81)   Activity Tolerance Patient limited by fatigue;Patient limited by pain   Patient Left in bed;with call bell/phone within reach;with bed alarm set   Nurse Communication Mobility status        Time: 0932-3557 OT Time Calculation (min): 27 min  Charges: OT General Charges $OT Visit: 1 Visit OT Treatments $Self Care/Home Management : 23-37 mins  [facility] MS, OTR/L ascom [phone]  08/29/22, 6:10 PM

## 2022-08-29 NOTE — Consult Note (Signed)
Consultation Note Date: 08/29/2022   Patient Name: Kristin [name]  DOB: 18-Mar-1941  MRN: [id number]  Age / Sex: 82 y.o., female  PCP: [name], NP Referring Physician: Lorella Nimrod, MD  Reason for Consultation: Establishing goals of care  HPI/Patient Profile: 82 y.o. female  with past medical history of dementia, [name], RA, remote subarachnoid hemorrhage due to ruptured aneurysm, DVT, [name], OA, fibromyalgia admitted on 08/23/2022 with fall and pelvic fracture.   Clinical Assessment and Goals of Care: Extensive chart reviewed. I met today with [place] and husband, Kristin [name], at bedside. Kristin [name] is sitting in recliner and has just finished her lunch. She ate very little but did drink some fluids. Kristin [name] shares that she ate better today and today was the first time she was able to feed herself some. Kristin [name] is able to participate in the conversation. When I ask how she is feeling she replies "decrepit!" She is having some pain sitting up in recliner and I helped to readjust. Her main concern is her pain. Darnell Level is concerned about how to care for her into the future. They confirm desire for rehab trial and we discussed that only time will tell how well she will be able to do and for how long. He does report that she goes to adult daycare 2 days a week when he is a Psychologist, occupational for [employer]. I voice my concern that she will not do well for long without improved intake as her body will not have the nutrients to heal and work with therapy. Ambera gets choked on a sip of thin liquids and Kristin [name] shares that this is not uncommon for the past ~2 months with concern for pocketing pills as well. I have requested SLP visit to assist with recommendations to make intake safer and see if this improves her ability to tolerate more intake.   I spoke further with Chrys Racer and Darnell Level about my concerns for how well she will do from here. We  discussed her wishes and they report that they do have Living Will. They express interest in updating HCPOA so I provided packet for them to do so and educated on access to chaplain service to assist with notarizing to complete when ready. Kristin [name] shares that Kristin [name] would not desire resuscitation per their previous conversations and Kristin [name] confirms DNR is her wish. They continue for treatment otherwise to optimize her quality and quantity of life. 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   All questions/concerns addressed. Emotional support provided.   Primary Decision Maker PATIENT and husband    SUMMARY OF RECOMMENDATIONS   - DNR decided - Hopeful for rehab stay  Code Status/Advance Care Planning: DNR   Symptom Management:  Per attending.   Prognosis:  Overall prognosis poor with poor intake and declining functional status in the setting of underlying dementia.   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 The following aspects are pertinent.  Past Medical History:  Diagnosis Date   Allergy    Arthritis    recent falls -aug 2010   [name] (coronary artery disease)    [id number]   Complication of anesthesia    pt states takes a long time to wake her wake up   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 ([name]) 08/27/2015   Cough    no fever    Degeneration of lumbar or lumbosacral intervertebral disc    Dizziness    r/t meds   DVT (deep venous thrombosis) (HCC)    Fibromyalgia    History of shingles    [name] (irritable bowel syndrome)    takes OTC Hardin Negus colon health   Joint pain    Joint swelling    Myalgia and  myositis, unspecified    Obstructive sleep apnea (adult) (pediatric)    Old myocardial infarction 1996   Osteoarthritis    Osteoarthrosis, unspecified whether generalized or localized, unspecified site    Psoriatic arthritis ([name])    Rheumatoid arthritis ([place])    Rosacea    Seasonal allergies    Stroke ([name])    ischaemic microvascular disease   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[name])    1991 , bleed and dizziness    Unspecified essential hypertension    takes Diltiazem,Metoprolol,and Losartan daily   UTI (lower urinary tract infection)    frequent=last one was winter of 2014   Social History   Socioeconomic History   Marital status: Married    Spouse name: Agricultural consultant   Number of children: 2   Years of education: BSN   Highest education level: Not on file  Occupational History   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 She is DNR, Kristin [name] is husband Kristin [name] , then son Kristin [name]       10/22/12-cardiac work up found the reason for her dizziness,  she needs no neurological follow up for that.  her enthusiasm for CPAP remains limited and is explained by her resiudal high AHI after she finally reached compliance ,[place] 13 points.     95% pressure is 11.7 cm , AHI  20 .7- she does not benefit from CPAP . She is allowed to d/c the machine and we need to concerntrate on her dizziness and facial  right droop. ordered MRI brain repeat.    Eye exam next week. PT at [facility]. MRI brain in April- per patients request order next month.          Social Determinants of Health   Financial Resource Strain: Low Risk  (06/13/2021)  Overall Financial Resource Strain (CARDIA)    Difficulty of Paying Living Expenses: Not hard at all  Food Insecurity: No Food Insecurity (08/24/2022)   Hunger Vital Sign    Worried About Running Out of Food in the Last Year: Never true    Ran Out of Food in the Last Year: Never true  Transportation Needs: No Transportation Needs (08/24/2022)   PRAPARE - Hydrologist (Medical): No    Lack of Transportation (Non-Medical): No  Physical Activity: Insufficiently Active (06/13/2021)   Exercise Vital Sign    Days of Exercise per Week: 3 days    Minutes of Exercise per Session: 20 min  Stress: No Stress Concern Present (01/11/2022)   [place]    Feeling of Stress : Not at all  Social Connections: Moderately Isolated (06/13/2021)   Social Connection and Isolation Panel [NHANES]    Frequency of Communication with Friends and Family: More than three times a week    Frequency of Social Gatherings with Friends and Family: More than three times a week    Attends Religious Services: Never    Marine scientist or Organizations: No    Attends Music therapist: Never    Marital Status: Married   Family History  Problem Relation Age of Onset   Colon cancer Father    Heart attack Mother    Coronary artery disease Mother    Osteoporosis Mother    Heart disease Mother    Heart failure Mother    GER disease Sister    Skin cancer Sister    Hypertension Sister    Osteopenia Sister    GER disease Sister    Breast cancer Paternal Aunt        age 56   Heart failure Maternal Grandmother    Heart failure Maternal Grandfather    Scheduled Meds:  acidophilus  1  capsule Oral Daily   aspirin  81 mg Oral Daily   [START ON 09/03/2022] cloNIDine  0.2 mg Transdermal Weekly   diltiazem  240 mg Oral Daily   donepezil  10 mg Oral QHS   DULoxetine  30 mg Oral BID   enoxaparin (LOVENOX) injection  40 mg Subcutaneous Q24H   feeding supplement  237 mL Oral TID BM   gabapentin  400 mg Oral BID   memantine  10 mg Oral BID   multivitamin with minerals  1 tablet Oral Daily   omega-3 acid ethyl esters  1 g Oral Daily   polyethylene glycol  17 g Oral Daily   Continuous Infusions: PRN Meds:.acetaminophen **OR** acetaminophen, diclofenac Sodium, magnesium hydroxide, ondansetron **OR** ondansetron (ZOFRAN) IV, oxyCODONE-acetaminophen, polyvinyl alcohol, traZODone Allergies  Allergen Reactions   Angiotensin Receptor Blockers Anaphylaxis    Fatigue & Dizziness   Other Anaphylaxis    Other reaction(s): Other (See Comments) Dizziness, rash Fatigue & Dizziness   Hydralazine Other (See Comments)    Unknown   Latex     REACTION: Rash, blisters, breathing probs.   Losartan     Dizziness    Methotrexate Derivatives     Dizziness, rash    Spironolactone     Dizziness    Penicillins Hives and Rash   Review of Systems  Constitutional:  Positive for activity change, appetite change and fatigue.  HENT:  Positive for trouble swallowing.   Respiratory:  Positive for cough. Negative for shortness of breath.   Neurological:  Positive for weakness.  Physical Exam Vitals and nursing note reviewed.  Constitutional:      General: She is not in acute distress.    Appearance: She is ill-appearing.  Cardiovascular:     Rate and Rhythm: Normal rate.  Pulmonary:     Effort: No tachypnea, accessory muscle usage or respiratory distress.     Comments: Cough with thin liquids Neurological:     Mental Status: She is alert.     Comments: Orientation fluctuates     Vital Signs: BP (!) 180/65 (BP Location: Right Arm)   Pulse 75   Temp 97.7 F (36.5 C)   Resp 16    Ht '5\' 7"'$  (1.702 m)   Wt 67 kg   LMP 08/14/1989   SpO2 96%   BMI 23.13 kg/m  Pain Scale: PAINAD POSS *See Group Information*: 1-Acceptable,Awake and alert Pain Score: Asleep   SpO2: SpO2: 96 % O2 Device:SpO2: 96 % O2 Flow Rate: .O2 Flow Rate (L/min): 2 L/min  IO: Intake/output summary:  Intake/Output Summary (Last 24 hours) at 08/29/2022 1406 Last data filed at 08/29/2022 0900 Gross per 24 hour  Intake 240 ml  Output 2440 ml  Net -2200 ml    LBM: Last BM Date :  (UTA) Baseline Weight: Weight: 67.1 kg Most recent weight: Weight: 67 kg     Palliative Assessment/Data:     Time In: 1400  Time Total: 80 min  Greater than 50%  of this time was spent counseling and coordinating care related to the above assessment and plan.  Signed by: Vinie Sill, NP Palliative Medicine Team Pager # [phone] (M-F 8a-5p) Team Phone # [phone] (Nights/Weekends)

## 2022-08-29 NOTE — Evaluation (Addendum)
Clinical/Bedside Swallow Evaluation Patient Details  Name: Kristin [name] MRN: [id number] Date of Birth: February 12, 1941  Today's Date: 08/29/2022 Time: SLP Start Time (ACUTE ONLY): 1600 SLP Stop Time (ACUTE ONLY): 1700 SLP Time Calculation (min) (ACUTE ONLY): 60 min  Past Medical History:  Past Medical History:  Diagnosis Date   Allergy    Arthritis    recent falls -aug 2010   [name] (coronary artery disease)    [id number]   Complication of anesthesia    pt states takes a long time to wake her wake up   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 ([place]) 08/27/2015   Cough    no fever    Degeneration of lumbar or lumbosacral intervertebral disc    Dizziness    r/t meds   DVT (deep venous thrombosis) (HCC)    Fibromyalgia    History of shingles    [name] (irritable bowel syndrome)    takes OTC Hardin Negus colon health   Joint pain    Joint swelling    Myalgia and myositis, unspecified    Obstructive sleep apnea (adult) (pediatric)    Old myocardial infarction 1996   Osteoarthritis    Osteoarthrosis, unspecified whether generalized or localized, unspecified site    Psoriatic arthritis ([place])    Rheumatoid arthritis ([place])    Rosacea    Seasonal allergies    Stroke ([place])    ischaemic microvascular disease   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[place])    1991 , bleed and dizziness    Unspecified essential hypertension    takes Diltiazem,Metoprolol,and Losartan daily   UTI (lower urinary tract infection)    frequent=last one was winter of 2014   Past Surgical History:  Past Surgical History:  Procedure Laterality Date   [place]   vasospams; no blockage   CARDIAC CATHETERIZATION  2013   No obstructive coronary artery disease. Mild distal LAD and proximal RCA disease.   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  Sustained minimally displcaed R superior/inferior pubic rami fxs. Orthopedics treating non-operatively.  Per chart/Palliative Care notes, pt's overally functioning has been declining.   CXR at admit: There are no signs of pulmonary edema or focal consolidation; minimal atelectasis.    Assessment / Plan / Recommendation  Clinical Impression  Pt seen for BSE today. She appears to present w/ oropharyngeal phase dysphagia in setting of Baseline Cognitive decline, Dementia, impacting her overall oropharyngeal awareness of boluses during oral intake; also recent illness/hospitalization may be impacting her overall stamina/strength and Cognition. Pt's oropharyngeal phase dysphagia was c/b lengthy oral phase time and inconsistent throat clearing  post swallowing/cough, moreso w/ trials of thin liquids (suspect a component of delayed pharyngeal swallowing initiation in setting of decreased awareness and bolus management as well as head-back positioning at times).  Pt is at increased risk for aspiration but this can be lessened by using a modified diet consistency w/ Supervision and feeding support at all meals. Recommend Pills CRUSHED in Puree for safer swallowing.  Pt appears to present w/ oropharyngeal phase dysphagia in setting of declined Cognitive status; Baseline  Dementia. Family has reported that pt's swallowing issues have been ongoing at home in recent months. ANY Cognitive decline can impact overall awareness/timing of swallow and safety during po tasks which increases risk for aspiration, choking. Pt's risk for aspiration can be reduced when following general aspiration precautions and using a modified diet consistency of broken down foods to help support Cognition/oral phase management of boluses.  She required MOD+ verbal/visual/tactile cues for follow through during po tasks and self-feeding.        Pt consumed several trials of ice chips, purees, minced solids and thin liquids via cup/straw w/ No immediate, overt clinical s/s of aspiration noted: no decline in vocal quality and no decline in respiratory status during/post trials. Delayed coughing b/t trials noted intermittently -- suspect impact from oral holding of bolus trials at times. Oral phase was c/b deficits of oral holding and slow, deliberate bolus management and A-P transfer of boluses; mastication of softened solids was lengthy and Time and moistening of the foods (broken small) necessary to support oral phase management, mastication, and oral clearing. Oral clearing was aided by alternating foods/liquids. Pt attempted self-feeding but required min-mod support and guidance d/t the Cognitive decline. Any self-feeding can improve safety of swallowing.  OM Exam appeared [facility] w/ No unilateral weakness noted during bolus management. Some confusion of OM tasks and oral care noted. Hand over hand guidance and visual cue were helpful to initiate tasks.         In setting of baseline Dementia and Cognitive decline, need for feeding support, sedentary status (See PT/Ortho notes), and her risk for aspiration, recommend initiation of the dysphagia level 2(MINCED foods moistened for ease of oral phase/mastication) w/ thin liquids via Cup as needed(monitor straw use for any increased coughing); aspiration  precautions; reduce Distractions during meals and engage pt during meals for self-feeding. Pills Crushed in Puree for safer swallowing as needed. Support w/ feeding at meals as needed. MD/NSG updated.  ST services recommends follow w/ Palliative Care for [place] and education re: impact of Cognitive decline/Dementia on swallowing and oral intake overall. Suspect pt is close to/at her baseline in setting of Cognitive decline, Dementia. Precautions posted in room. SLP Visit Diagnosis: Dysphagia, oropharyngeal phase (R13.12) (baseline Dementia, Cognitive decline)    Aspiration Risk  Mild aspiration risk;Risk for inadequate nutrition/hydration    Diet Recommendation   Dysphagia level 2 (minced foods w/ gravies); thin liquids w/ strict straw monitoring -- Pinch straw to limit bolus amount to SMALL, SINGLE sips. Aspiration precautions. Supervision at all meals to support feeding and safer swallowing(check for oral clearing). Reduce distractions at meals.   Medication Administration: Crushed with puree    Other  Recommendations Recommended Consults:  (Palliative Care for [place]; Dietician f/u) Oral Care Recommendations: Oral care BID;Oral care before and after PO;Staff/trained caregiver to provide oral care Other Recommendations:  (n/a currently)    Recommendations for follow up therapy are one component of a multi-disciplinary discharge planning process, led by the attending physician.  Recommendations may be updated  based on patient status, additional functional criteria and insurance authorization.  Follow up Recommendations Follow physician's recommendations for discharge plan and follow up therapies      Assistance Recommended at Discharge  full  Functional Status Assessment Patient has had a recent decline in their functional status and/or demonstrates limited ability to make significant improvements in function in a reasonable and predictable amount of time  Frequency and Duration min 1 x/week  1  week       Prognosis Prognosis for Safe Diet Advancement: Guarded Barriers to Reach Goals: Cognitive deficits;Language deficits;Time post onset;Severity of deficits Barriers/Prognosis Comment: baseline Cognitive decline, Dementia      Swallow Study   General Date of Onset: 08/23/22 HPI: Pt is a 82 y.o. 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  Sustained minimally displcaed R superior/inferior pubic rami fxs. Orthopedics treating non-operatively.  Per chart/Palliative Care notes, pt's overally functioning has been declining.   CXR at admit: There are no signs of pulmonary edema or focal consolidation; minimal atelectasis. Type of Study: Bedside Swallow Evaluation Previous Swallow Assessment: none Diet Prior to this Study: Regular;Thin liquids Temperature Spikes Noted: No (wbc 7.9) Respiratory Status: Room air History of Recent Intubation: No Behavior/Cognition: Alert;Cooperative;Pleasant mood;Confused;Requires cueing;Distractible;Doesn't follow directions (Eyes closed; Dementia baseline) Oral Cavity Assessment: Within Functional Limits Oral Care Completed by SLP: Yes Oral Cavity - Dentition: Adequate natural dentition (few missing) Vision: Functional for self-feeding (when cued) Self-Feeding Abilities: Able to feed self (by holding Cup to drink) Patient Positioning: Upright in bed (needed full positioning for head forward) Baseline Vocal Quality: Low vocal intensity (adequate) Volitional Cough: Cognitively unable to elicit Volitional Swallow: Unable to elicit    Oral/Motor/Sensory Function Overall Oral Motor/Sensory Function: Within functional limits   Ice Chips Ice chips: Within functional limits Presentation: Spoon (fed; 2 trials) Other Comments: decerased oral prep awareness w/out cues   Thin Liquid Thin Liquid: Impaired (slight,  inconsistent) Presentation: Cup;Self Fed;Straw (suppoted; 12+ trials) Oral Phase Impairments: Poor awareness of bolus Oral Phase Functional Implications: Prolonged oral transit (intermittent) Pharyngeal  Phase Impairments: Suspected delayed Swallow;Multiple swallows;Throat Clearing - Delayed;Cough - Delayed (x1-2 each) Other Comments: improved management of the bolus w/ head forward positioning - min kyophotic and head/chin up b/f corrected    Nectar Thick Nectar Thick Liquid: Not tested   Honey Thick Honey Thick Liquid: Not tested   Puree Puree: Impaired Presentation: Spoon (fed; 10 trials) Oral Phase Impairments: Poor awareness of bolus Oral Phase Functional Implications: Prolonged oral transit (intermittent) Pharyngeal Phase Impairments: Multiple swallows;Throat Clearing - Delayed (x1)   Solid     Solid: Impaired Presentation: Spoon (fed; 5 trials) Oral Phase Impairments: Impaired mastication;Poor awareness of bolus Oral Phase Functional Implications: Prolonged oral transit;Impaired mastication Pharyngeal Phase Impairments: Multiple swallows       Orinda Kenner, MS, CCC-SLP Speech Language Pathologist Rehab Services; [place] [phone] (ascom) Janeal Abadi 08/29/2022,6:18 PM

## 2022-08-29 NOTE — Progress Notes (Signed)
RE: Kristin [name] Date of Birth: Jan 10, 1941 Date: 08/29/2022     To Whom It May Concern:   Please be advised that the above-named patient will require a short-term nursing home stay - anticipated 30 days or less for rehabilitation and strengthening.  The plan is for return home

## 2022-08-29 NOTE — Progress Notes (Signed)
Physical Therapy Treatment Patient Details Name: Kristin [name] MRN: [id number] DOB: 02-23-1941 Today's Date: 08/29/2022   History of Present Illness 82 y/o female presented to ED on 08/23/22 following fall. Sustained minimally displcaed R superior/inferior pubic rami fxs. Orthopedics treating non-operatively. 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  She is premedicated for session.  Tolerates BLE PROM prior to transitioning to EOB with HOB fully raised and mod a x 1.  Sitting with min guard/supervision today and is able to remain seated for several minutes without support.  Some seated AROM.  +2 is called and she is able to stand with mod a x 2 and take several poor quality steps to chair.  Remains up after session with needs in reach.  Generally seems more comfortable today with improved tolerance to session.   Recommendations for follow up therapy are one component of a multi-disciplinary discharge planning process, led by the attending physician.  Recommendations may be updated based on patient status, additional functional criteria and insurance authorization.  Follow Up Recommendations  Skilled nursing-short term rehab (<3 hours/day) Can patient physically be transported by private vehicle: No   Assistance Recommended at Discharge Frequent or constant Supervision/Assistance  Patient can return home with the following Two people to help with walking and/or transfers;A lot of help with bathing/dressing/bathroom;Assistance with cooking/housework   Equipment Recommendations  Rolling walker (2 wheels);BSC/3in1;Wheelchair (measurements PT);Wheelchair cushion (measurements PT)    Recommendations for Other Services       Precautions / Restrictions Precautions Precautions: Fall Restrictions Weight Bearing Restrictions: Yes RLE Weight Bearing: Weight bearing as tolerated     Mobility  Bed Mobility Overal bed mobility:  Needs Assistance       Supine to sit: Mod assist, Max assist, HOB elevated     General bed mobility comments: did well with +1 today to EOB    Transfers Overall transfer level: Needs assistance Equipment used: Rolling walker (2 wheels) Transfers: Sit to/from Stand Sit to Stand: +2 physical assistance, Mod assist                Ambulation/Gait Ambulation/Gait assistance: Mod assist, +2 physical assistance Gait Distance (Feet): 2 Feet Assistive device: Rolling walker (2 wheels) Gait Pattern/deviations: Step-to pattern Gait velocity: decreased     General Gait Details: poor quality steps to chair but she is able to move her feet today   Stairs             Wheelchair Mobility    Modified Rankin (Stroke Patients Only)       Balance Overall balance assessment: Needs assistance Sitting-balance support: Single extremity supported, Feet supported Sitting balance-Leahy Scale: Fair Sitting balance - Comments: able to sit unsupported today, does lean L vs right today. Postural control: Left lateral lean Standing balance support: Bilateral upper extremity supported Standing balance-Leahy Scale: Poor Standing balance comment: overall standing quality is improved today but remains flexed posture and not fully upright.                            Cognition Arousal/Alertness: Awake/alert Behavior During Therapy: Flat affect Overall Cognitive Status: History of cognitive impairments - at baseline  Exercises Other Exercises Other Exercises: BLE AAROM in supine and some AROM in sitting.    General Comments        Pertinent Vitals/Pain Pain Assessment Pain Assessment: Faces Faces Pain Scale: Hurts little more Pain Location: BUEs with movement, R hip - overall seems improved Pain Descriptors / Indicators: Grimacing, Guarding Pain Intervention(s): Limited activity within patient's tolerance,  Monitored during session, Premedicated before session    Home Living                          Prior Function            PT Goals (current goals can now be found in the care plan section) Progress towards PT goals: Progressing toward goals    Frequency    7X/week      PT Plan Current plan remains appropriate    Co-evaluation              AM-PAC PT "6 Clicks" Mobility   Outcome Measure  Help needed turning from your back to your side while in a flat bed without using bedrails?: Total Help needed moving from lying on your back to sitting on the side of a flat bed without using bedrails?: A Lot Help needed moving to and from a bed to a chair (including a wheelchair)?: A Lot Help needed standing up from a chair using your arms (e.g., wheelchair or bedside chair)?: A Lot Help needed to walk in hospital room?: Total Help needed climbing 3-5 steps with a railing? : Total 6 Click Score: 9    End of Session Equipment Utilized During Treatment: Gait belt Activity Tolerance: Patient limited by pain;Patient limited by fatigue Patient left: in bed;with call bell/phone within reach;with bed alarm set Nurse Communication: Mobility status PT Visit Diagnosis: Unsteadiness on feet (R26.81);Muscle weakness (generalized) (M62.81);Difficulty in walking, not elsewhere classified (R26.2);Other abnormalities of gait and mobility (R26.89);History of falling (Z91.81)     Time: 5188-4166 PT Time Calculation (min) (ACUTE ONLY): 17 min  Charges:  $Therapeutic Activity: 8-22 mins                   Chesley Noon, PTA 08/29/22, 9:56 AM

## 2022-08-29 NOTE — TOC Progression Note (Signed)
Transition of Care [facility]) - Progression Note    Patient Details  Name: Kristin [name] MRN: [id number] Date of Birth: 1940-09-13  Transition of Care [facility]) CM/SW [place], LCSW Phone Number: 08/29/2022, 10:22 AM  Clinical Narrative:     HTA [place]) called with approvals for SNF (approval #: [id number]) and EMS (approval #: [id number]).  Waiting on PASRR.  Pt going to [facility] for rehab.  Expected Discharge Plan: [place] Barriers to Discharge: Continued Medical Work up  Expected Discharge Plan and Services     Post Acute Care Choice:  (unknown at this time) Living arrangements for the past 2 months: Single Family Home                                       Social Determinants of Health (SDOH) Interventions SDOH Screenings   Food Insecurity: No Food Insecurity (08/24/2022)  Housing: Low Risk  (08/24/2022)  Transportation Needs: No Transportation Needs (08/24/2022)  Utilities: Not At Risk (08/24/2022)  Alcohol Screen: Low Risk  (09/21/2021)  Depression (PHQ2-9): Low Risk  (01/11/2022)  Financial Resource Strain: Low Risk  (06/13/2021)  Physical Activity: Insufficiently Active (06/13/2021)  Social Connections: Moderately Isolated (06/13/2021)  Stress: No Stress Concern Present (01/11/2022)  Tobacco Use: Low Risk  (08/23/2022)    Readmission Risk Interventions     No data to display

## 2022-08-29 NOTE — Progress Notes (Signed)
Progress Note   Patient: Kristin [name] DOB: May 20, 1941 DOA: 08/23/2022     5 DOS: the patient was seen and examined on 08/29/2022   Brief hospital course: Taken from H&P.  Kristin [name] is a 82 y.o.Caucasian female with medical history significant for Coronary artery disease, fibromyalgia, DVT, [name], osteoarthritis, rheumatoid arthritis, remote subarachnoid hemorrhage due to ruptured aneurysm, who presented to the emergency room with mechanical fall with subsequent right hip pain and inability to ambulate.     ED Course: Upon presentation to the emergency room, BP was 163/67 with otherwise normal vital signs. BMP revealed hypokalemia of 3.1 and a creatinine 1.17 close to baseline and CBC showed no leukocytosis of 11.8.  Influenza antigens and COVID-19 PCR as well as RSV PCR came back negative. EKG as reviewed by me : EKG showed normal sinus rhythm with a rate of 60. Imaging: 2 view chest x-ray showed blunting of the left lateral CP angle that may be due to minimal pleural effusion or pleural thickening with small linear densities in the lateral aspect of the left lower lung fields that may suggest scarring or subsegmental atelectasis. DG hip with cortical irregularity in the upper margin of right pubic bone and at the junction of right inferior pubic ramus and ischium suggesting possible undisplaced recent or old fracture. CT head and cervical spine was negative for any acute abnormality or fracture. DG lumbar spine and bilateral knees were negative for any acute fractures or injury.  1/11: Blood pressure elevated at 165/72.  Labs with resolution of leukocytosis and hypokalemia.  Some improvement of creatinine to 1.11, remained within her baseline. PT/OT ordered-recommending SNF Orthopedic consult -recommending conservative management and she will be weightbearing as tolerated.  1/12: Blood pressure remained elevated.  Patient is pretty much allergic to most of other  antihypertensives and currently on Cardizem only.  Pain can be contributory. Awaiting placement.  1/13: Vital stable with mildly elevated blood pressure.  Awaiting SNF placement  1/14: Blood pressure remained mildly elevated.  Patient had in and out catheter once yesterday for concern of urinary retention as postvoid volume was above 500 mL.  Total UOP recorded 968. Appetite and p.o. intake remained poor.  Ordered clonidine patch to see if that will help with blood pressure due to her extensive allergy list. Still no workup done in terms of placement by prior TOC-we can TOC Kristin [name] started the process.  1/15: Blood pressure remained elevated, increasing the dose of clonidine to 0.2 mg.  Labs with mild hypokalemia, potassium at 3.1, creatinine has been normalized.  Slight decrease in hemoglobin to 11 but all cell lines decreased.  Patient also having incomplete bladder emptying, pain with pelvic rami fracture can be contributory.  We will try to encourage voiding well to avoid Foley catheter placement which can be done as last resort.  Pending SNF placement.  1/16: Blood pressure remained elevated, discontinuing clonidine patch and starting her on 0.2 mg 3 times daily clonidine.  Potassium remains at 3.1 with normal magnesium at 2.4.  Ordered more replacement.  Voiding improved today.  Still pending insurance authorization for SNF  Patient is high risk for deterioration and mortality based on advanced dementia, poor p.o. intake and underlying comorbidities.  Palliative care was consulted  Assessment and Plan: * Pelvic ring fracture (Kristin [name]) - The patient has a right pubic ramus fracture secondary to mechanical fall. Orthopedic surgery is recommending conservative management and she will be weightbearing as tolerated. -Continue with pain management. -  PT is recommending SNF.    Hypokalemia Persistent hypokalemia with potassium remained at 3.1, magnesium within normal limit  -Replete  potassium -Continue to monitor  Essential hypertension Blood pressure remained elevated.  Apparently allergic to a lot of antihypertensives.  She was only on Cardizem 240 mg at home. -Add clonidine p.o. at 0.2 mg 3 times daily and discontinuing patch. -Continue home Cardizem -Continue to monitor  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Kristin [name]) - We will continue Aricept, Namenda and Cymbalta.  Peripheral neuropathy - We will continue Neurontin.   Subjective: Patient was willing improved and sitting in chair when seen today.  Denies any significant pain.  She was able to void and denying any difficulty urinating.  Physical Exam: Vitals:   08/28/22 1743 08/28/22 2012 08/29/22 0013 08/29/22 0741  BP: (!) 175/73 (!) 170/65 (!) 171/70 (!) 180/65  Pulse: 82 81 72 75  Resp: '16 15 16 16  '$ Temp: 97.7 F (36.5 C) 99.4 F (37.4 C) 99 F (37.2 C) 97.7 F (36.5 C)  TempSrc:  Oral Oral   SpO2: 100% 100% 96% 96%  Weight:      Height:       General.  Frail elderly lady, in no acute distress. Pulmonary.  Lungs clear bilaterally, normal respiratory effort. CV.  Regular rate and rhythm, no JVD, rub or murmur. Abdomen.  Soft, nontender, nondistended, BS positive. CNS.  Alert and oriented .  No focal neurologic deficit. Extremities.  No edema, no cyanosis, pulses intact and symmetrical. Psychiatry.  Judgment and insight appears impaired.  Data Reviewed: Prior data reviewed  Family Communication: Discussed with husband on phone.  Disposition: Status is: Inpatient Remains inpatient appropriate because: Severity of illness  Planned Discharge Destination: Skilled nursing facility  DVT prophylaxis.  Lovenox Time spent: 38 minutes  This record has been created using Systems analyst. Errors have been sought and corrected,but may not always be located. Such creation errors do not reflect on the standard of care.   Author: Lorella Nimrod, MD 08/29/2022 3:51 PM  For on  call review [email or web address].

## 2022-08-30 DIAGNOSIS — I10 Essential (primary) hypertension: Secondary | ICD-10-CM | POA: Diagnosis not present

## 2022-08-30 LAB — BASIC METABOLIC PANEL
Anion gap: 6 (ref 5–15)
BUN: 34 mg/dL — ABNORMAL HIGH (ref 8–23)
CO2: 27 mmol/L (ref 22–32)
Calcium: 8.9 mg/dL (ref 8.9–10.3)
Chloride: 113 mmol/L — ABNORMAL HIGH (ref 98–111)
Creatinine, Ser: 0.92 mg/dL (ref 0.44–1.00)
GFR, Estimated: >60 mL/min (ref >60)
Glucose, Bld: 125 mg/dL — ABNORMAL HIGH (ref 70–99)
Potassium: 3.7 mmol/L (ref 3.5–5.1)
Sodium: 146 mmol/L — ABNORMAL HIGH (ref 135–145)

## 2022-08-30 MED ORDER — ENSURE ENLIVE PO LIQD: 237.0000 mL | Freq: Three times a day (TID) | ORAL | 12 refills | Status: DC

## 2022-08-30 MED ORDER — DULOXETINE HCL 30 MG PO CPEP: 30.0000 mg | ORAL_CAPSULE | Freq: Two times a day (BID) | ORAL | 0 refills | Status: DC

## 2022-08-30 MED ORDER — OXYCODONE-ACETAMINOPHEN 5-325 MG PO TABS: 1.0000 | ORAL_TABLET | ORAL | 0 refills | Status: DC | PRN

## 2022-08-30 MED ORDER — CLONIDINE HCL 0.2 MG PO TABS: 0.2000 mg | ORAL_TABLET | Freq: Three times a day (TID) | ORAL | 11 refills | Status: DC

## 2022-08-30 MED ORDER — CHLORHEXIDINE GLUCONATE CLOTH 2 % EX PADS
6.0000 | MEDICATED_PAD | Freq: Every day | CUTANEOUS | Status: DC
  Administered 2022-08-30 – 2022-08-31 (×2): 6 via TOPICAL

## 2022-08-30 MED ORDER — TRAZODONE HCL 50 MG PO TABS: 25.0000 mg | ORAL_TABLET | Freq: Every evening | ORAL | 0 refills | Status: AC | PRN

## 2022-08-30 NOTE — Progress Notes (Signed)
Physical Therapy Treatment Patient Details Name: Kristin [name] MRN: [id number] DOB: [birth year] Today's Date: 08/30/2022   History of Present Illness 82 y/o female presented to ED on 08/23/22 following fall. Sustained minimally displcaed R superior/inferior pubic rami fxs. Orthopedics treating non-operatively. PMH: [name], fibromyalgia, RA, remote SAH due to ruptured aneurysm    PT Comments    Pt was long sitting in bed, eyes closed but awake. When cued to open eyes, she does and is able to maintain eyes open throughout remainder of session. Pt is pleasant but presents with cognition deficits that limits session progression. She was able to tolerate getting OOB and standing 3 x EOB with +1 max assist. Bed height slightly lowered each attempt. She does require moderate amount of vcs for hand placement and overall improved technique. Poor carry-over between trials. Lunch tray arrived. Author assisted with set up but informed RN staff she may need assist. Pt is far from baseline and will greatly benefit from SNF to maximize independence while decreasing caregiver burden.    Recommendations for follow up therapy are one component of a multi-disciplinary discharge planning process, led by the attending physician.  Recommendations may be updated based on patient status, additional functional criteria and insurance authorization.  Follow Up Recommendations  Skilled nursing-short term rehab (<3 hours/day)     Assistance Recommended at Discharge Frequent or constant Supervision/Assistance  Patient can return home with the following A lot of help with walking and/or transfers;A lot of help with bathing/dressing/bathroom;Assistance with cooking/housework;Assistance with feeding;Direct supervision/assist for medications management;Direct supervision/assist for financial management;Assist for transportation;Help with stairs or ramp for entrance   Equipment Recommendations   (defer to next level of care)        Precautions / Restrictions Precautions Precautions: Fall Restrictions Weight Bearing Restrictions: Yes RLE Weight Bearing: Weight bearing as tolerated     Mobility  Bed Mobility Overal bed mobility: Needs Assistance Bed Mobility: Supine to Sit, Sit to Supine Rolling: Max assist  Supine to sit: Max assist Sit to supine: Max assist  General bed mobility comments: Increased time to perform    Transfers Overall transfer level: Needs assistance Equipment used: Rolling walker (2 wheels) Transfers: Sit to/from Stand Sit to Stand: From elevated surface, Max assist      General transfer comment: pt performed STS 3 x EOB. Vcs for technique and posture correction once in standing. tolerated standinf ~ 1 minutes each time. Focused on lateral wt shift in standing however elected not to ambulate away from EOB.    Ambulation/Gait    General Gait Details: not formally tested this session.   Balance Overall balance assessment: Needs assistance Sitting-balance support: Feet supported Sitting balance-Leahy Scale: Fair     Standing balance support: Bilateral upper extremity supported Standing balance-Leahy Scale: Poor Standing balance comment: poor standing posture however pt does erct posture and tolerated standing 3 x ~ 1 minute. Pt c/o more LLE pain than RLE       Cognition Arousal/Alertness: Awake/alert Behavior During Therapy: Flat affect Overall Cognitive Status: History of cognitive impairments - at baseline    General Comments: Pt present with flat affect. Agrees to session but cognition greatly limits session progression. she is only oriented to self               Pertinent Vitals/Pain Pain Assessment Pain Assessment: PAINAD Breathing: normal Negative Vocalization: occasional moan/groan, low speech, negative/disapproving quality Facial Expression: smiling or inexpressive Body Language: relaxed Consolability: distracted or reassured by voice/touch PAINAD Score:  2 Pain  Location: R hip Pain Descriptors / Indicators: Grimacing, Guarding Pain Intervention(s): Limited activity within patient's tolerance, Monitored during session, Premedicated before session, Repositioned     PT Goals (current goals can now be found in the care plan section) Acute Rehab PT Goals Patient Stated Goal: did not state Progress towards PT goals: Progressing toward goals    Frequency    7X/week      PT Plan Current plan remains appropriate    Co-evaluation     PT goals addressed during session: Mobility/safety with mobility;Balance;Proper use of DME;Strengthening/ROM        AM-PAC PT "6 Clicks" Mobility   Outcome Measure  Help needed turning from your back to your side while in a flat bed without using bedrails?: A Lot Help needed moving from lying on your back to sitting on the side of a flat bed without using bedrails?: A Lot Help needed moving to and from a bed to a chair (including a wheelchair)?: A Lot Help needed standing up from a chair using your arms (e.g., wheelchair or bedside chair)?: A Lot Help needed to walk in hospital room?: Total Help needed climbing 3-5 steps with a railing? : Total 6 Click Score: 10    End of Session   Activity Tolerance: Patient tolerated treatment well Patient left: in bed;with call bell/phone within reach;with bed alarm set Nurse Communication: Mobility status PT Visit Diagnosis: Unsteadiness on feet (R26.81);Muscle weakness (generalized) (M62.81);Difficulty in walking, not elsewhere classified (R26.2);Other abnormalities of gait and mobility (R26.89);History of falling (Z91.81)     Time: 4210-3128 PT Time Calculation (min) (ACUTE ONLY): 14 min  Charges:  $Therapeutic Activity: 8-22 mins                    Julaine Fusi PTA 08/30/22, 2:57 PM

## 2022-08-30 NOTE — Progress Notes (Signed)
Progress Note   Patient: Kristin [name] GQQ:[id number] DOB: 1941-03-09 DOA: 08/23/2022     6 DOS: the patient was seen and examined on 08/30/2022   Brief hospital course: Taken from H&P.  Kristin [name] is a 82 y.o.Caucasian female with medical history significant for Coronary artery disease, fibromyalgia, DVT, [name], osteoarthritis, rheumatoid arthritis, remote subarachnoid hemorrhage due to ruptured aneurysm, who presented to the emergency room with mechanical fall with subsequent right hip pain and inability to ambulate.     ED Course: Upon presentation to the emergency room, BP was 163/67 with otherwise normal vital signs. BMP revealed hypokalemia of 3.1 and a creatinine 1.17 close to baseline and CBC showed no leukocytosis of 11.8.  Influenza antigens and COVID-19 PCR as well as RSV PCR came back negative. EKG as reviewed by me : EKG showed normal sinus rhythm with a rate of 60. Imaging: 2 view chest x-ray showed blunting of the left lateral CP angle that may be due to minimal pleural effusion or pleural thickening with small linear densities in the lateral aspect of the left lower lung fields that may suggest scarring or subsegmental atelectasis. DG hip with cortical irregularity in the upper margin of right pubic bone and at the junction of right inferior pubic ramus and ischium suggesting possible undisplaced recent or old fracture. CT head and cervical spine was negative for any acute abnormality or fracture. DG lumbar spine and bilateral knees were negative for any acute fractures or injury.  1/11: Blood pressure elevated at 165/72.  Labs with resolution of leukocytosis and hypokalemia.  Some improvement of creatinine to 1.11, remained within her baseline. PT/OT ordered-recommending SNF Orthopedic consult -recommending conservative management and she will be weightbearing as tolerated.  1/12: Blood pressure remained elevated.  Patient is pretty much allergic to most of other  antihypertensives and currently on Cardizem only.  Pain can be contributory. Awaiting placement.  1/13: Vital stable with mildly elevated blood pressure.  Awaiting SNF placement  1/14: Blood pressure remained mildly elevated.  Patient had in and out catheter once yesterday for concern of urinary retention as postvoid volume was above 500 mL.  Total UOP recorded 968. Appetite and p.o. intake remained poor.  Ordered clonidine patch to see if that will help with blood pressure due to her extensive allergy list. Still no workup done in terms of placement by prior TOC-we can TOC [name] started the process.  1/15: Blood pressure remained elevated, increasing the dose of clonidine to 0.2 mg.  Labs with mild hypokalemia, potassium at 3.1, creatinine has been normalized.  Slight decrease in hemoglobin to 11 but all cell lines decreased.  Patient also having incomplete bladder emptying, pain with pelvic rami fracture can be contributory.  We will try to encourage voiding well to avoid Foley catheter placement which can be done as last resort.  Pending SNF placement.  1/16: Blood pressure remained elevated, discontinuing clonidine patch and starting her on 0.2 mg 3 times daily clonidine.  Potassium remains at 3.1 with normal magnesium at 2.4.  Ordered more replacement.  Voiding improved today.  Still pending insurance authorization for SNF  1/17 BP remains elevated will monitor and adjust as indicated. Patient stable otherwise doing well and pain is well controller per patient this AM.  Patient is high risk for deterioration and mortality based on advanced dementia, poor p.o. intake and underlying comorbidities.  Palliative care was consulted  Assessment and Plan: * Pelvic ring fracture ([place]) - The patient has a right  pubic ramus fracture secondary to mechanical fall. Orthopedic surgery is recommending conservative management and she will be weightbearing as tolerated. -Continue with pain management. -  PT is recommending SNF.  Hypokalemia Persistent hypokalemia with potassium now 3.7 after replacement -Replete potassium -Continue to monitor  Essential hypertension Blood pressure remained elevated.  Apparently allergic to a lot of antihypertensives.  She was only on Cardizem 240 mg at home. -on 1/16 added clonidine p.o. at 0.2 mg 3 times daily and discontinuing patch. - monitor BP today and adjust as needed -Continue home Cardizem -Continue to monitor  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[place]) - We will continue Aricept, Namenda and Cymbalta.  Peripheral neuropathy - We will continue Neurontin.  Subjective: Patient sitting up in bed has no complaints, having breakfast. Pleasantly confused.  Physical Exam: Vitals:   08/29/22 0741 08/29/22 1600 08/29/22 2154 08/30/22 0000  BP: (!) 180/65 (!) 154/63 (!) 163/67 (!) 162/83  Pulse: 75 79 81 76  Resp: '16 16  16  '$ Temp: 97.7 F (36.5 C) 98.6 F (37 C)  98.2 F (36.8 C)  TempSrc:  Oral    SpO2: 96% 98%  95%  Weight:      Height:       General:  Alert, oriented to self and place, calm, in no acute distress  Eyes: EOMI, clear conjuctivae, white sclerea Neck: supple, no masses, trachea mildline  Cardiovascular: RRR, no murmurs or rubs, no peripheral edema  Respiratory: clear to auscultation bilaterally, no wheezes, no crackles  Abdomen: soft, nontender, nondistended, normal bowel tones heard  Skin: dry, no rashes  Musculoskeletal: no joint effusions, normal range of motion  Psychiatric: appropriate affect, normal speech, somewhat disoriented Neurologic: extraocular muscles intact, clear speech, moving all extremities with intact sensorium   Data Reviewed: Prior data reviewed  Family Communication: Previous MD discussed with husband on phone, no family present this AM.  Disposition: Status is: Inpatient Remains inpatient appropriate because: Severity of illness  Planned Discharge Destination: Skilled nursing  facility  DVT prophylaxis.  Lovenox Time spent: 28 minutes  Author: Calypso Hagarty Marry Guan, MD 08/30/2022 9:49 AM  For on call review [email or web address].

## 2022-08-30 NOTE — Plan of Care (Signed)

## 2022-08-31 DIAGNOSIS — M069 Rheumatoid arthritis, unspecified: Secondary | ICD-10-CM | POA: Diagnosis not present

## 2022-08-31 DIAGNOSIS — I252 Old myocardial infarction: Secondary | ICD-10-CM | POA: Diagnosis not present

## 2022-08-31 DIAGNOSIS — D649 Anemia, unspecified: Secondary | ICD-10-CM | POA: Diagnosis not present

## 2022-08-31 DIAGNOSIS — S32810D Multiple fractures of pelvis with stable disruption of pelvic ring, subsequent encounter for fracture with routine healing: Secondary | ICD-10-CM | POA: Diagnosis not present

## 2022-08-31 DIAGNOSIS — J9601 Acute respiratory failure with hypoxia: Secondary | ICD-10-CM | POA: Diagnosis not present

## 2022-08-31 DIAGNOSIS — L4050 Arthropathic psoriasis, unspecified: Secondary | ICD-10-CM | POA: Diagnosis not present

## 2022-08-31 DIAGNOSIS — R402421 Glasgow coma scale score 9-12, in the field [EMT or ambulance]: Secondary | ICD-10-CM | POA: Diagnosis not present

## 2022-08-31 DIAGNOSIS — K589 Irritable bowel syndrome without diarrhea: Secondary | ICD-10-CM | POA: Diagnosis not present

## 2022-08-31 DIAGNOSIS — R5381 Other malaise: Secondary | ICD-10-CM | POA: Diagnosis not present

## 2022-08-31 DIAGNOSIS — R0902 Hypoxemia: Secondary | ICD-10-CM | POA: Diagnosis not present

## 2022-08-31 DIAGNOSIS — E663 Overweight: Secondary | ICD-10-CM | POA: Diagnosis not present

## 2022-08-31 DIAGNOSIS — J9811 Atelectasis: Secondary | ICD-10-CM | POA: Diagnosis not present

## 2022-08-31 DIAGNOSIS — M8580 Other specified disorders of bone density and structure, unspecified site: Secondary | ICD-10-CM | POA: Diagnosis not present

## 2022-08-31 DIAGNOSIS — M6281 Muscle weakness (generalized): Secondary | ICD-10-CM | POA: Diagnosis not present

## 2022-08-31 DIAGNOSIS — Z7401 Bed confinement status: Secondary | ICD-10-CM | POA: Diagnosis not present

## 2022-08-31 DIAGNOSIS — J90 Pleural effusion, not elsewhere classified: Secondary | ICD-10-CM | POA: Diagnosis not present

## 2022-08-31 DIAGNOSIS — F0153 Vascular dementia, unspecified severity, with mood disturbance: Secondary | ICD-10-CM | POA: Diagnosis not present

## 2022-08-31 DIAGNOSIS — I10 Essential (primary) hypertension: Secondary | ICD-10-CM | POA: Diagnosis not present

## 2022-08-31 DIAGNOSIS — E785 Hyperlipidemia, unspecified: Secondary | ICD-10-CM | POA: Diagnosis not present

## 2022-08-31 DIAGNOSIS — M1990 Unspecified osteoarthritis, unspecified site: Secondary | ICD-10-CM | POA: Diagnosis not present

## 2022-08-31 DIAGNOSIS — G4733 Obstructive sleep apnea (adult) (pediatric): Secondary | ICD-10-CM | POA: Diagnosis not present

## 2022-08-31 DIAGNOSIS — E876 Hypokalemia: Secondary | ICD-10-CM | POA: Diagnosis not present

## 2022-08-31 DIAGNOSIS — R41841 Cognitive communication deficit: Secondary | ICD-10-CM | POA: Diagnosis not present

## 2022-08-31 DIAGNOSIS — J302 Other seasonal allergic rhinitis: Secondary | ICD-10-CM | POA: Diagnosis not present

## 2022-08-31 DIAGNOSIS — L89616 Pressure-induced deep tissue damage of right heel: Secondary | ICD-10-CM | POA: Diagnosis not present

## 2022-08-31 DIAGNOSIS — L89626 Pressure-induced deep tissue damage of left heel: Secondary | ICD-10-CM | POA: Diagnosis not present

## 2022-08-31 DIAGNOSIS — S32591D Other specified fracture of right pubis, subsequent encounter for fracture with routine healing: Secondary | ICD-10-CM | POA: Diagnosis not present

## 2022-08-31 DIAGNOSIS — J1282 Pneumonia due to coronavirus disease 2019: Secondary | ICD-10-CM | POA: Diagnosis not present

## 2022-08-31 DIAGNOSIS — G629 Polyneuropathy, unspecified: Secondary | ICD-10-CM | POA: Diagnosis not present

## 2022-08-31 DIAGNOSIS — N390 Urinary tract infection, site not specified: Secondary | ICD-10-CM | POA: Diagnosis not present

## 2022-08-31 DIAGNOSIS — I672 Cerebral atherosclerosis: Secondary | ICD-10-CM | POA: Diagnosis not present

## 2022-08-31 DIAGNOSIS — M797 Fibromyalgia: Secondary | ICD-10-CM | POA: Diagnosis not present

## 2022-08-31 DIAGNOSIS — R0602 Shortness of breath: Secondary | ICD-10-CM | POA: Diagnosis not present

## 2022-08-31 DIAGNOSIS — N183 Chronic kidney disease, stage 3 unspecified: Secondary | ICD-10-CM | POA: Diagnosis not present

## 2022-08-31 DIAGNOSIS — Z8673 Personal history of transient ischemic attack (TIA), and cerebral infarction without residual deficits: Secondary | ICD-10-CM | POA: Diagnosis not present

## 2022-08-31 DIAGNOSIS — N179 Acute kidney failure, unspecified: Secondary | ICD-10-CM | POA: Diagnosis not present

## 2022-08-31 DIAGNOSIS — R55 Syncope and collapse: Secondary | ICD-10-CM | POA: Diagnosis not present

## 2022-08-31 DIAGNOSIS — Z66 Do not resuscitate: Secondary | ICD-10-CM | POA: Diagnosis not present

## 2022-08-31 DIAGNOSIS — I25111 Atherosclerotic heart disease of native coronary artery with angina pectoris with documented spasm: Secondary | ICD-10-CM | POA: Diagnosis not present

## 2022-08-31 DIAGNOSIS — R404 Transient alteration of awareness: Secondary | ICD-10-CM | POA: Diagnosis not present

## 2022-08-31 DIAGNOSIS — Z7189 Other specified counseling: Secondary | ICD-10-CM | POA: Diagnosis not present

## 2022-08-31 DIAGNOSIS — I959 Hypotension, unspecified: Secondary | ICD-10-CM | POA: Diagnosis not present

## 2022-08-31 DIAGNOSIS — R0689 Other abnormalities of breathing: Secondary | ICD-10-CM | POA: Diagnosis not present

## 2022-08-31 DIAGNOSIS — R1312 Dysphagia, oropharyngeal phase: Secondary | ICD-10-CM | POA: Diagnosis not present

## 2022-08-31 DIAGNOSIS — R339 Retention of urine, unspecified: Secondary | ICD-10-CM | POA: Diagnosis not present

## 2022-08-31 DIAGNOSIS — Z515 Encounter for palliative care: Secondary | ICD-10-CM | POA: Diagnosis not present

## 2022-08-31 DIAGNOSIS — U071 COVID-19: Secondary | ICD-10-CM | POA: Diagnosis not present

## 2022-08-31 DIAGNOSIS — F32A Depression, unspecified: Secondary | ICD-10-CM | POA: Diagnosis not present

## 2022-08-31 NOTE — TOC Transition Note (Signed)
Transition of Care [facility]) - CM/SW Discharge Note   Patient Details  Name: Kristin [name] MRN: [id number] Date of Birth: 10/16/1940  [facility] CM/SW Contact:  Quin Hoop, LCSW Phone Number: 08/31/2022, 3:54 PM   Clinical Narrative:    Patient discharging to [facility] by [facility].  Spoke with [employer] @ EMS.  Patient spouse notified of transport.  Patient going to room 2A.  Call report: [phone].   Final next level of care: Skilled Nursing Facility Barriers to Discharge: No Barriers Identified   Patient Goals and CMS Choice   Choice offered to / list presented to : Patient, Spouse  Discharge Placement PASRR number recieved: 08/31/22 PASRR number recieved: 08/31/22            Patient [facility] bed at: [facility] Patient to be transferred to facility by: [facility] Name of family member notified: Darnell Level, spouse Patient and family notified of of transfer: 08/31/22  Discharge Plan and Services Additional resources added to the After Visit Summary for       Post Acute Care Choice:  (unknown at this time)                               Social Determinants of Health (SDOH) Interventions SDOH Screenings   Food Insecurity: No Food Insecurity (08/24/2022)  Housing: Low Risk  (08/24/2022)  Transportation Needs: No Transportation Needs (08/24/2022)  Utilities: Not At Risk (08/24/2022)  Alcohol Screen: Low Risk  (09/21/2021)  Depression (PHQ2-9): Low Risk  (01/11/2022)  Financial Resource Strain: Low Risk  (06/13/2021)  Physical Activity: Insufficiently Active (06/13/2021)  Social Connections: Moderately Isolated (06/13/2021)  Stress: No Stress Concern Present (01/11/2022)  Tobacco Use: Low Risk  (08/23/2022)     Readmission Risk Interventions     No data to display

## 2022-08-31 NOTE — Progress Notes (Signed)
Nutrition Follow-up  DOCUMENTATION CODES:   Not applicable  INTERVENTION:   -Continue Ensure Enlive po TID, each supplement provides 350 kcal and 20 grams of protein -Continue MVI with minerals daily -Continue feeding assistance with meals -Continue dysphagia 2 diet with thin liquids -30 ml Prosource Plus TID, each supplement provides 100 kcals and 15 grams protein  NUTRITION DIAGNOSIS:   Inadequate oral intake related to poor appetite as evidenced by per patient/family report.  Ongoing  GOAL:   Patient will meet greater than or equal to 90% of their needs  Unmet  MONITOR:   PO intake, Supplement acceptance  REASON FOR ASSESSMENT:   Consult Assessment of nutrition requirement/status, Poor PO  ASSESSMENT:   Pt with medical history significant for Coronary artery disease, fibromyalgia, DVT, [name], osteoarthritis, rheumatoid arthritis, remote subarachnoid hemorrhage due to ruptured aneurysm, who presented with mechanical fall with subsequent right hip pain and inability to ambulate.  1/16- s/p BSE- downgraded to dysphagia 2 diet  Reviewed I/O's: -400 ml x 24 hours and -5.2 L since admission  UOP: 1.1 L x 24 hours  Pt unavailable at time of visit. Attempted to speak with pt via call to hospital room phone, however, unable to reach.   Pt remains with poor oral intake. Noted meal completions 0% since last visit. Per MAR, pt is accepting Ensure supplements, however, suspect pt is drinking very little of them.   No new wt since admission.   Palliative care following for goals of care discussions; pt with poor prognosis. Intake continues to decline and suspect pt will remain unable to meet nutritional needs. Given pt's advanced age and dementia, would not recommend alternative means of nutrition/ hydration, as this would not enhance pt's quality of life.   Pt awaiting insurance authorization for SNF placement.   Medications reviewed and include cardizem, lovenox, lovaza,  and miralax.   Labs reviewed: Na: 146.    Diet Order:   Diet Order             DIET DYS 2 Room service appropriate? Yes with Assist; Fluid consistency: Thin  Diet effective now                   EDUCATION NEEDS:   No education needs have been identified at this time  Skin:  Skin Assessment: Reviewed RN Assessment  Last BM:  08/30/22  Height:   Ht Readings from Last 1 Encounters:  08/23/22 '5\' 7"'$  (1.702 m)    Weight:   Wt Readings from Last 1 Encounters:  08/23/22 67 kg    Ideal Body Weight:  61.4 kg  BMI:  Body mass index is 23.13 kg/m.  Estimated Nutritional Needs:   Kcal:  1650-1850  Protein:  85-100 grams  Fluid:  > 1.6 L    Loistine Chance, RD, LDN,  Registered Dietitian II Certified Diabetes Care and Education Specialist Please refer to [facility] for RD and/or RD on-call/weekend/after hours pager

## 2022-08-31 NOTE — Plan of Care (Signed)
  Problem: Education: ?Goal: Knowledge of General Education information will improve ?Description: Including pain rating scale, medication(s)/side effects and non-pharmacologic comfort measures ?Outcome: Progressing ?  ?Problem: Health Behavior/Discharge Planning: ?Goal: Ability to manage health-related needs will improve ?Outcome: Progressing ?  ?Problem: Clinical Measurements: ?Goal: Ability to maintain clinical measurements within normal limits will improve ?Outcome: Progressing ?Goal: Will remain free from infection ?Outcome: Progressing ?Goal: Diagnostic test results will improve ?Outcome: Progressing ?Goal: Respiratory complications will improve ?Outcome: Progressing ?Goal: Cardiovascular complication will be avoided ?Outcome: Progressing ?  ?Problem: Activity: ?Goal: Risk for activity intolerance will decrease ?Outcome: Progressing ?  ?Problem: Nutrition: ?Goal: Adequate nutrition will be maintained ?Outcome: Progressing ?  ?Problem: Coping: ?Goal: Level of anxiety will decrease ?Outcome: Progressing ?  ?Problem: Elimination: ?Goal: Will not experience complications related to bowel motility ?Outcome: Progressing ?Goal: Will not experience complications related to urinary retention ?Outcome: Progressing ?  ?Problem: Pain Managment: ?Goal: General experience of comfort will improve ?Outcome: Progressing ?  ?Problem: Safety: ?Goal: Ability to remain free from injury will improve ?Outcome: Progressing ?  ?Problem: Skin Integrity: ?Goal: Risk for impaired skin integrity will decrease ?Outcome: Progressing ?  ?Problem: Education: ?Goal: Knowledge of General Education information will improve ?Description: Including pain rating scale, medication(s)/side effects and non-pharmacologic comfort measures ?Outcome: Progressing ?  ?Problem: Health Behavior/Discharge Planning: ?Goal: Ability to manage health-related needs will improve ?Outcome: Progressing ?  ?Problem: Clinical Measurements: ?Goal: Ability to maintain  clinical measurements within normal limits will improve ?Outcome: Progressing ?Goal: Will remain free from infection ?Outcome: Progressing ?Goal: Diagnostic test results will improve ?Outcome: Progressing ?Goal: Respiratory complications will improve ?Outcome: Progressing ?Goal: Cardiovascular complication will be avoided ?Outcome: Progressing ?  ?Problem: Activity: ?Goal: Risk for activity intolerance will decrease ?Outcome: Progressing ?  ?Problem: Nutrition: ?Goal: Adequate nutrition will be maintained ?Outcome: Progressing ?  ?Problem: Coping: ?Goal: Level of anxiety will decrease ?Outcome: Progressing ?  ?Problem: Elimination: ?Goal: Will not experience complications related to bowel motility ?Outcome: Progressing ?Goal: Will not experience complications related to urinary retention ?Outcome: Progressing ?  ?Problem: Pain Managment: ?Goal: General experience of comfort will improve ?Outcome: Progressing ?  ?Problem: Safety: ?Goal: Ability to remain free from injury will improve ?Outcome: Progressing ?  ?Problem: Skin Integrity: ?Goal: Risk for impaired skin integrity will decrease ?Outcome: Progressing ?  ?

## 2022-08-31 NOTE — Care Management Important Message (Signed)
Important Message  Patient Details  Name: Kristin [name] MRN: [id number] Date of Birth: 1940/09/08   Medicare Important Message Given:  Yes     [name] 08/31/2022, 11:08 AM

## 2022-08-31 NOTE — Discharge Summary (Signed)
Discharge Summary  Kristin [name] ZDG:[id number] DOB: [birth year]  PCP: [name], NP  Admit date: 08/23/2022 Discharge date: 08/31/2022  Recommendations for Outpatient Follow-up:  Please follow up with your PCP with CBC and BMP in 1-2 weeks Follow up with orthopedic surgery in the next 2-4 weeks  Discharge Diagnoses:  Active Hospital Problems   Diagnosis Date Noted   Pelvic ring fracture ([place]) 08/23/2022    Priority: 1.   Essential hypertension 08/24/2022    Priority: 2.   Hypokalemia 08/24/2022    Priority: 2.   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   Peripheral neuropathy 08/24/2022    Resolved Hospital Problems  No resolved problems to display.   Discharge Condition: Stable   Diet recommendation: Diet Orders (From admission, onward)     Start     Ordered   08/29/22 1803  DIET DYS 2 Room service appropriate? Yes with Assist; Fluid consistency: Thin  Diet effective now       Comments: Extra Gravies on meats, potatoes. Yogurts, puddings. Cream Soups at meals.  Question Answer Comment  Room service appropriate? Yes with Assist   Fluid consistency: Thin      08/29/22 1802           HPI and Brief Hospital Course:  Kristin [name] is a 82 y.o.Caucasian female with medical history significant for Coronary artery disease, fibromyalgia, DVT, [name], osteoarthritis, rheumatoid arthritis, remote subarachnoid hemorrhage due to ruptured aneurysm, who presented to the emergency room with mechanical fall with subsequent right hip pain and inability to ambulate. She had imaging DG hip with cortical irregularity in the upper margin of right pubic bone and at the junction of right inferior pubic ramus and ischium suggesting possible undisplaced recent or old fracture. CT head and cervical spine was negative for any acute abnormality or fracture. DG lumbar spine and bilateral knees were negative for any acute fractures or injury.  She was  admitted, seen by orthopedic surgery who recommended conservative management. She was seen by PT/OT who recommends SNF and she has been accepted to a facility today.   During her hospital stay, BP has been hard to control but it is now at goal on the new medications prescribed below.  Procedures: None  Consultations: Orthopedic surgery  Discharge details, plan of care and follow up instructions were discussed with patient and any available family or care providers. Patient and family are in agreement with discharge from the hospital today and all questions were answered to their satisfaction.  Discharge Exam: BP (!) 143/62 (BP Location: Left Arm)   Pulse 72   Temp (!) 97.5 F (36.4 C)   Resp 16   Ht '5\' 7"'$  (1.702 m)   Wt 67 kg   LMP 08/14/1989   SpO2 96%   BMI 23.13 kg/m  General:  Alert, oriented, calm, in no acute distress  Eyes: EOMI, clear sclerea Neck: supple, no masses, trachea mildline  Cardiovascular: RRR, no murmurs or rubs, no peripheral edema  Respiratory: clear to auscultation bilaterally, no wheezes, no crackles  Abdomen: soft, nontender, nondistended, normal bowel tones heard  Skin: dry, no rashes  Musculoskeletal: no joint effusions, normal range of motion  Psychiatric: appropriate affect, normal speech  Neurologic: extraocular muscles intact, clear speech, moving all extremities with intact sensorium   Discharge Instructions You were cared for by a hospitalist during your hospital stay. If you have any questions about your discharge medications or the care you received while  you were in the hospital after you are discharged, you can call the unit and asked to speak with the hospitalist on call if the hospitalist that took care of you is not available. Once you are discharged, your primary care physician will handle any further medical issues. Please note that NO REFILLS for any discharge medications will be authorized once you are discharged, as it is imperative  that you return to your primary care physician (or establish a relationship with a primary care physician if you do not have one) for your aftercare needs so that they can reassess your need for medications and monitor your lab values.   Allergies as of 08/31/2022       Reactions   Angiotensin Receptor Blockers Anaphylaxis   Fatigue & Dizziness   Other Anaphylaxis   Other reaction(s): Other (See Comments) Dizziness, rash Fatigue & Dizziness   Hydralazine Other (See Comments)   Unknown   Latex    REACTION: Rash, blisters, breathing probs.   Losartan    Dizziness   Methotrexate Derivatives    Dizziness, rash   Spironolactone    Dizziness   Penicillins Hives, Rash        Medication List     TAKE these medications    acetaminophen 325 MG tablet Commonly known as: TYLENOL Take 650 mg by mouth every 8 (eight) hours as needed (pain).   aspirin 81 MG chewable tablet Chew 81 mg by mouth daily.   CALTRATE 600+D PO Take 1 tablet by mouth daily at 12 noon.   cloNIDine 0.2 MG tablet Commonly known as: CATAPRES Take 1 tablet (0.2 mg total) by mouth 3 (three) times daily.   Cranberry 400 MG Tabs Take 1 tablet by mouth daily.   CULTURELLE PO Take 1 capsule by mouth daily.   diclofenac Sodium 1 % Gel Commonly known as: VOLTAREN Apply 2 g topically 4 (four) times daily as needed.   diltiazem 240 MG 24 hr capsule Commonly known as: CARDIZEM CD TAKE 1 CAPSULE BY MOUTH DAILY   donepezil 10 MG tablet Commonly known as: ARICEPT Take 10 mg by mouth at bedtime.   DULoxetine 30 MG capsule Commonly known as: CYMBALTA Take 1 capsule (30 mg total) by mouth 2 (two) times daily. What changed:  medication strength how much to take Another medication with the same name was removed. Continue taking this medication, and follow the directions you see here.   feeding supplement Liqd Take 237 mLs by mouth 3 (three) times daily between meals.   Fish Oil 1200 MG Caps Take 1 capsule  by mouth daily.   gabapentin 400 MG capsule Commonly known as: NEURONTIN Take 400 mg by mouth 2 (two) times daily.   memantine 10 MG tablet Commonly known as: NAMENDA TAKE 1 TABLET BY MOUTH TWICE DAILY   multivitamin tablet Take 1 tablet by mouth daily.   oxyCODONE-acetaminophen 5-325 MG tablet Commonly known as: PERCOCET/ROXICET Take 1 tablet by mouth every 4 (four) hours as needed for moderate pain or severe pain.   Polyethyl Glycol-Propyl Glycol 0.4-0.3 % Soln Apply 1 drop to eye daily as needed (dry eyes).   Red Yeast Rice 600 MG Caps Take 2 capsules by mouth daily.   traZODone 50 MG tablet Commonly known as: DESYREL Take 0.5 tablets (25 mg total) by mouth at bedtime as needed for sleep.       Allergies  Allergen Reactions   Angiotensin Receptor Blockers Anaphylaxis    Fatigue & Dizziness   Other Anaphylaxis  Other reaction(s): Other (See Comments) Dizziness, rash Fatigue & Dizziness   Hydralazine Other (See Comments)    Unknown   Latex     REACTION: Rash, blisters, breathing probs.   Losartan     Dizziness    Methotrexate Derivatives     Dizziness, rash    Spironolactone     Dizziness    Penicillins Hives and Rash    Contact information for after-discharge care     [facility] .   Service: Skilled Nursing Contact information: [place] [phone]                     The results of significant diagnostics from this hospitalization (including imaging, microbiology, ancillary and laboratory) are listed below for reference.    Significant Diagnostic Studies: CT Cervical Spine Wo Contrast  Result Date: 08/23/2022 CLINICAL DATA:  Trauma, fall EXAM: CT CERVICAL SPINE WITHOUT CONTRAST TECHNIQUE: Multidetector CT imaging of the cervical spine was performed without intravenous contrast. Multiplanar CT image reconstructions were also generated. RADIATION  DOSE REDUCTION: This exam was performed according to the departmental dose-optimization program which includes automated exposure control, adjustment of the mA and/or kV according to patient size and/or use of iterative reconstruction technique. COMPARISON:  None Available. FINDINGS: Alignment: No recent fracture is seen. There is minimal anterolisthesis at C4-C5 level. Skull base and vertebrae: Degenerative changes are noted with disc space narrowing and bony spurs at multiple levels, more so at C5-C6 and C6-C7 levels. There is partial fusion of C2 and C3 vertebrae. Anterior arch of C1 is smaller than usual. There is possible fusion of anterior arch of C1 and tip of odontoid process. Soft tissues and spinal canal: Prevertebral soft tissues are unremarkable. Posterior bony spurs are causing extrinsic pressure over the ventral margin of thecal sac at C5-C6 and C6-C7 levels. Disc levels: There is encroachment of neural foramina from C3-C7 levels. Upper chest: Pleural thickening in both apices may suggest scarring. Other: There is 1 cm low-density nodule in the right lobe of thyroid no follow-up imaging is recommended. IMPRESSION: No recent fracture is seen cervical spine. Cervical spondylosis with encroachment of neural foramina from C3-C7 levels. Other findings as described in the body of the report. Electronically Signed   By: Elmer Picker M.D.   On: 08/23/2022 18:14   CT HEAD WO CONTRAST (5MM)  Result Date: 08/23/2022 CLINICAL DATA:  Trauma, fall EXAM: CT HEAD WITHOUT CONTRAST TECHNIQUE: Contiguous axial images were obtained from the base of the skull through the vertex without intravenous contrast. RADIATION DOSE REDUCTION: This exam was performed according to the departmental dose-optimization program which includes automated exposure control, adjustment of the mA and/or kV according to patient size and/or use of iterative reconstruction technique. COMPARISON:  06/18/2017 FINDINGS: Brain: No acute  intracranial findings are seen. There are no signs of bleeding within the cranium. Cortical sulci are prominent. There is decreased density in periventricular and subcortical white matter. Vascular: Scattered arterial calcifications are seen. Skull: No fracture is seen in calvarium. Sinuses/Orbits: There is mucosal thickening in ethmoid sinus. There is defect in the medial wall of left maxillary sinus, possibly related to previous surgery. Other: None. IMPRESSION: No acute intracranial findings are seen in noncontrast CT brain. Atrophy. Small-vessel disease. Electronically Signed   By: Elmer Picker M.D.   On: 08/23/2022 18:06   DG Lumbar Spine Complete  Result Date: 08/23/2022 CLINICAL DATA:  Trauma, fall EXAM:  LUMBAR SPINE - COMPLETE 4+ VIEW COMPARISON:  01/15/2009 FINDINGS: No recent fracture is seen. Degenerative changes are noted with disc space narrowing, bony spurs and facet hypertrophy at multiple levels. There is mild decrease in height of left side of L5 vertebral body without definite demonstrable break in the cortical margins. There is mild levoscoliosis. Scattered vascular calcifications are seen. IMPRESSION: No recent displaced fracture or dislocation is seen. There is mild decrease in height of body of L5 vertebra which may be residual change from previous injury and degenerative arthritis. Lumbar spondylosis. Electronically Signed   By: Elmer Picker M.D.   On: 08/23/2022 18:01   DG Knee Complete 4 Views Left  Result Date: 08/23/2022 CLINICAL DATA:  Trauma, fall EXAM: LEFT KNEE - COMPLETE 4+ VIEW COMPARISON:  None Available. FINDINGS: No recent displaced fracture or dislocation is seen. In the AP view, there is faint linear lucency in the upper lateral aspect of patella. This finding could not be seen in the rest of the images. There are few smooth marginated calcifications adjacent to the upper margin of patella. Degenerative changes are noted with bony spurs and  chondrocalcinosis. There is no significant effusion. Arterial calcifications are seen in soft tissues. IMPRESSION: No displaced fracture or dislocation is seen. There is linear lucency in the upper lateral aspect of patella which may suggest recent or old undisplaced fracture. Calcifications seen at the attachment of quadriceps tendon to the patella may suggest calcific tendinosis. Electronically Signed   By: Elmer Picker M.D.   On: 08/23/2022 17:57   DG Knee Complete 4 Views Right  Result Date: 08/23/2022 CLINICAL DATA:  Trauma, fall EXAM: RIGHT KNEE - COMPLETE 4+ VIEW COMPARISON:  None Available. FINDINGS: No recent fracture or dislocation is seen. There is no significant effusion in suprapatellar bursa. There are scattered arterial calcifications in soft tissues. There are few smooth marginated calcifications adjacent to the upper margin of patella. Small bony spurs seen in medial, lateral and patellofemoral compartments. IMPRESSION: No recent fracture or dislocation is seen. Degenerative changes are noted with small bony spurs. Small smooth marginated calcifications adjacent to the upper margin of patella may suggest calcific bursitis or tendinosis. Electronically Signed   By: Elmer Picker M.D.   On: 08/23/2022 17:54   DG Hip Unilat W or Wo Pelvis 2-3 Views Right  Result Date: 08/23/2022 CLINICAL DATA:  Trauma, fall EXAM: DG HIP (WITH OR WITHOUT PELVIS) 2-3V RIGHT COMPARISON:  None Available. FINDINGS: No displaced fracture or dislocation is seen. There is cortical irregularity at the junction right inferior pubic ramus and ischium. There is minimal cortical irregularity in the upper cortical margin of body of right pubic bone. Degenerative changes are noted in the lower lumbar spine. Scattered arterial calcifications are seen. IMPRESSION: Cortical irregularity is seen in the upper margin of right pubic bone and at the junction of right inferior pubic ramus and ischium suggesting possible  undisplaced recent or old fractures. No definite fracture is seen in proximal right femur. Arteriosclerosis.  Lumbar spondylosis. Electronically Signed   By: Elmer Picker M.D.   On: 08/23/2022 17:52   DG Chest 2 View  Result Date: 08/23/2022 CLINICAL DATA:  Trauma, fall EXAM: CHEST - 2 VIEW COMPARISON:  06/18/2017 FINDINGS: Cardiac size is within normal limits. There are no signs of pulmonary edema or focal consolidation. Small linear densities are seen in lateral aspect of left lower lung field. There is blunting of left lateral CP angle. There is no pneumothorax. IMPRESSION: There are no signs  of pulmonary edema or focal consolidation. Blunting of left lateral CP angle may be due to minimal effusion or pleural thickening. Small linear densities in the lateral aspect of left lower lung fields may suggest minimal scarring or subsegmental atelectasis. Electronically Signed   By: Elmer Picker M.D.   On: 08/23/2022 17:48    Microbiology: Recent Results (from the past 240 hour(s))  Resp panel by RT-PCR (RSV, Flu A&B, Covid) Anterior Nasal Swab     Status: None   Collection Time: 08/23/22  9:53 PM   Specimen: Anterior Nasal Swab  Result Value Ref Range Status   SARS Coronavirus 2 by RT PCR NEGATIVE NEGATIVE Final    Comment: (NOTE) SARS-CoV-2 target nucleic acids are NOT DETECTED.  The SARS-CoV-2 RNA is generally detectable in upper respiratory specimens during the acute phase of infection. The lowest concentration of SARS-CoV-2 viral copies this assay can detect is 138 copies/mL. A negative result does not preclude SARS-Cov-2 infection and should not be used as the sole basis for treatment or other patient management decisions. A negative result may occur with  improper specimen collection/handling, submission of specimen other than nasopharyngeal swab, presence of viral mutation(s) within the areas targeted by this assay, and inadequate number of viral copies(<138 copies/mL). A  negative result must be combined with clinical observations, patient history, and epidemiological information. The expected result is Negative.  Fact Sheet for Patients:  EntrepreneurPulse.com.au  Fact Sheet for Healthcare Providers:  IncredibleEmployment.be  This test is no t yet approved or cleared by the [place] FDA and  has been authorized for detection and/or diagnosis of SARS-CoV-2 by FDA under an Emergency Use Authorization (EUA). This EUA will remain  in effect (meaning this test can be used) for the duration of the COVID-19 declaration under Section 564(b)(1) of the Act, 21 U.S.C.section 360bbb-3(b)(1), unless the authorization is terminated  or revoked sooner.       Influenza A by PCR NEGATIVE NEGATIVE Final   Influenza B by PCR NEGATIVE NEGATIVE Final    Comment: (NOTE) The Xpert Xpress SARS-CoV-2/FLU/RSV plus assay is intended as an aid in the diagnosis of influenza from Nasopharyngeal swab specimens and should not be used as a sole basis for treatment. Nasal washings and aspirates are unacceptable for Xpert Xpress SARS-CoV-2/FLU/RSV testing.  Fact Sheet for Patients: EntrepreneurPulse.com.au  Fact Sheet for Healthcare Providers: IncredibleEmployment.be  This test is not yet approved or cleared by the [place] FDA and has been authorized for detection and/or diagnosis of SARS-CoV-2 by FDA under an Emergency Use Authorization (EUA). This EUA will remain in effect (meaning this test can be used) for the duration of the COVID-19 declaration under Section 564(b)(1) of the Act, 21 U.S.C. section 360bbb-3(b)(1), unless the authorization is terminated or revoked.     Resp Syncytial Virus by PCR NEGATIVE NEGATIVE Final    Comment: (NOTE) Fact Sheet for Patients: EntrepreneurPulse.com.au  Fact Sheet for Healthcare  Providers: IncredibleEmployment.be  This test is not yet approved or cleared by the [place] FDA and has been authorized for detection and/or diagnosis of SARS-CoV-2 by FDA under an Emergency Use Authorization (EUA). This EUA will remain in effect (meaning this test can be used) for the duration of the COVID-19 declaration under Section 564(b)(1) of the Act, 21 U.S.C. section 360bbb-3(b)(1), unless the authorization is terminated or revoked.  Performed at [facility], [address], [place], [place] [address]      Labs: Basic Metabolic Panel: Recent Labs  Lab 08/28/22 [phone] 08/29/22  0154 08/30/22 0407  NA 144  --  146*  K 3.1* 3.1* 3.7  CL 109  --  113*  CO2 27  --  27  GLUCOSE 114*  --  125*  BUN 23  --  34*  CREATININE 0.79  --  0.92  CALCIUM 8.7*  --  8.9  MG  --  2.4  --    Liver Function Tests: No results for input(s): "AST", "ALT", "ALKPHOS", "BILITOT", "PROT", "ALBUMIN" in the last 168 hours. No results for input(s): "LIPASE", "AMYLASE" in the last 168 hours. No results for input(s): "AMMONIA" in the last 168 hours. CBC: Recent Labs  Lab 08/28/22 0458  WBC 7.9  HGB 11.0*  HCT 32.8*  MCV 95.1  PLT 199   Cardiac Enzymes: No results for input(s): "CKTOTAL", "CKMB", "CKMBINDEX", "TROPONINI" in the last 168 hours. BNP: BNP (last 3 results) No results for input(s): "BNP" in the last 8760 hours.  ProBNP (last 3 results) No results for input(s): "PROBNP" in the last 8760 hours.  CBG: No results for input(s): "GLUCAP" in the last 168 hours.  Time spent: > 30 minutes were spent in preparing this discharge including medication reconciliation, counseling, and coordination of care.  Signed:  Irven Ingalsbe Marry Guan, MD  [facility] 08/31/2022, 3:54 PM

## 2022-08-31 NOTE — Progress Notes (Signed)
Progress Note   Patient: Kristin [name] DOB: January 16, 1941 DOA: 08/23/2022     7 DOS: the patient was seen and examined on 08/31/2022   Brief hospital course: Taken from H&P.  Kristin [name] is a 82 y.o.Caucasian female with medical history significant for Coronary artery disease, fibromyalgia, DVT, [name], osteoarthritis, rheumatoid arthritis, remote subarachnoid hemorrhage due to ruptured aneurysm, who presented to the emergency room with mechanical fall with subsequent right hip pain and inability to ambulate.     ED Course: Upon presentation to the emergency room, BP was 163/67 with otherwise normal vital signs. BMP revealed hypokalemia of 3.1 and a creatinine 1.17 close to baseline and CBC showed no leukocytosis of 11.8.  Influenza antigens and COVID-19 PCR as well as RSV PCR came back negative. EKG as reviewed by me : EKG showed normal sinus rhythm with a rate of 60. Imaging: 2 view chest x-ray showed blunting of the left lateral CP angle that may be due to minimal pleural effusion or pleural thickening with small linear densities in the lateral aspect of the left lower lung fields that may suggest scarring or subsegmental atelectasis. DG hip with cortical irregularity in the upper margin of right pubic bone and at the junction of right inferior pubic ramus and ischium suggesting possible undisplaced recent or old fracture. CT head and cervical spine was negative for any acute abnormality or fracture. DG lumbar spine and bilateral knees were negative for any acute fractures or injury.  1/11: Blood pressure elevated at 165/72.  Labs with resolution of leukocytosis and hypokalemia.  Some improvement of creatinine to 1.11, remained within her baseline. PT/OT ordered-recommending SNF Orthopedic consult -recommending conservative management and she will be weightbearing as tolerated.  1/12: Blood pressure remained elevated.  Patient is pretty much allergic to most of other  antihypertensives and currently on Cardizem only.  Pain can be contributory. Awaiting placement.  1/13: Vital stable with mildly elevated blood pressure.  Awaiting SNF placement  1/14: Blood pressure remained mildly elevated.  Patient had in and out catheter once yesterday for concern of urinary retention as postvoid volume was above 500 mL.  Total UOP recorded 968. Appetite and p.o. intake remained poor.  Ordered clonidine patch to see if that will help with blood pressure due to her extensive allergy list. Still no workup done in terms of placement by prior TOC-we can TOC [name] started the process.  1/15: Blood pressure remained elevated, increasing the dose of clonidine to 0.2 mg.  Labs with mild hypokalemia, potassium at 3.1, creatinine has been normalized.  Slight decrease in hemoglobin to 11 but all cell lines decreased.  Patient also having incomplete bladder emptying, pain with pelvic rami fracture can be contributory.  We will try to encourage voiding well to avoid Foley catheter placement which can be done as last resort.  Pending SNF placement.  1/16: Blood pressure remained elevated, discontinuing clonidine patch and starting her on 0.2 mg 3 times daily clonidine.  Potassium remains at 3.1 with normal magnesium at 2.4.  Ordered more replacement.  Voiding improved today.  Still pending insurance authorization for SNF  1/17 BP remains elevated will monitor and adjust as indicated. Patient stable otherwise doing well and pain is well controller per patient this AM.  1/18 Doing well, no acute issues, BP finally seems to be well controlled on the current regimen. Awaiting SNF placement.  Patient is high risk for deterioration and mortality based on advanced dementia, poor p.o. intake and underlying  comorbidities.  Palliative care was consulted  Assessment and Plan: * Pelvic ring fracture ([place]) - The patient has a right pubic ramus fracture secondary to mechanical fall. Orthopedic  surgery is recommending conservative management and she will be weightbearing as tolerated. -Continue with pain management. - PT is recommending SNF.  Hypokalemia Persistent hypokalemia with potassium now in normal range after replacement -Replete potassium -Continue to monitor  Essential hypertension Blood pressure remained elevated but is better now.  Apparently allergic to a lot of antihypertensives.  She was only on Cardizem 240 mg at home. -on 1/16 added clonidine p.o. at 0.2 mg 3 times daily and discontinuing patch. 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  Peripheral neuropathy - We will continue Neurontin.  Subjective:  She is lying in bed this AM resting, no complaints. Remains pleasantly confused.  Physical Exam: Vitals:   08/30/22 1607 08/30/22 2056 08/30/22 2344 08/31/22 0744  BP: (!) 123/56 (!) 150/59 135/61 135/68  Pulse: 66 69 60 67  Resp: '16 18 16 16  '$ Temp:  97.6 F (36.4 C) 97.6 F (36.4 C) 98.5 F (36.9 C)  TempSrc:  Oral    SpO2: 97% 98% 95% 92%  Weight:      Height:      General:  Alert, oriented to self only today, calm, in no acute distress  Eyes: EOMI, clear conjuctivae, white sclerea Neck: supple, no masses, trachea mildline  Cardiovascular: RRR, no murmurs or rubs, no peripheral edema  Respiratory: clear to auscultation bilaterally, no wheezes, no crackles  Abdomen: soft, nontender, nondistended, normal bowel tones heard  Skin: dry, no rashes  Musculoskeletal: no joint effusions, normal range of motion  Psychiatric: appropriate affect, normal speech  Neurologic: extraocular muscles intact, clear speech, moving all extremities with intact sensorium   Data Reviewed: Prior data reviewed  Family Communication: Updated husband on the phone this morning.  Disposition: Status is: Inpatient Remains  inpatient appropriate because: Severity of illness  Planned Discharge Destination: Skilled nursing facility  DVT prophylaxis.  Lovenox Time spent: 28 minutes  Author: Aldridge Krzyzanowski Marry Guan, MD 08/31/2022 8:59 AM  For on call review [email or web address].

## 2022-09-04 DIAGNOSIS — R5381 Other malaise: Secondary | ICD-10-CM | POA: Diagnosis not present

## 2022-09-04 DIAGNOSIS — I10 Essential (primary) hypertension: Secondary | ICD-10-CM | POA: Diagnosis not present

## 2022-09-05 DIAGNOSIS — F0153 Vascular dementia, unspecified severity, with mood disturbance: Secondary | ICD-10-CM | POA: Diagnosis not present

## 2022-09-05 DIAGNOSIS — L89626 Pressure-induced deep tissue damage of left heel: Secondary | ICD-10-CM | POA: Diagnosis not present

## 2022-09-05 DIAGNOSIS — E785 Hyperlipidemia, unspecified: Secondary | ICD-10-CM | POA: Diagnosis not present

## 2022-09-05 DIAGNOSIS — L89616 Pressure-induced deep tissue damage of right heel: Secondary | ICD-10-CM | POA: Diagnosis not present

## 2022-09-07 DIAGNOSIS — E876 Hypokalemia: Secondary | ICD-10-CM | POA: Diagnosis not present

## 2022-09-07 DIAGNOSIS — E785 Hyperlipidemia, unspecified: Secondary | ICD-10-CM | POA: Diagnosis not present

## 2022-09-07 DIAGNOSIS — M797 Fibromyalgia: Secondary | ICD-10-CM | POA: Diagnosis not present

## 2022-09-08 DIAGNOSIS — D649 Anemia, unspecified: Secondary | ICD-10-CM | POA: Diagnosis not present

## 2022-09-12 DIAGNOSIS — E876 Hypokalemia: Secondary | ICD-10-CM | POA: Diagnosis not present

## 2022-09-12 DIAGNOSIS — I25111 Atherosclerotic heart disease of native coronary artery with angina pectoris with documented spasm: Secondary | ICD-10-CM | POA: Diagnosis not present

## 2022-09-12 DIAGNOSIS — M797 Fibromyalgia: Secondary | ICD-10-CM | POA: Diagnosis not present

## 2022-09-12 DIAGNOSIS — I10 Essential (primary) hypertension: Secondary | ICD-10-CM | POA: Diagnosis not present

## 2022-09-14 DIAGNOSIS — I10 Essential (primary) hypertension: Secondary | ICD-10-CM | POA: Diagnosis not present

## 2022-09-14 DIAGNOSIS — E876 Hypokalemia: Secondary | ICD-10-CM | POA: Diagnosis not present

## 2022-09-14 DIAGNOSIS — M797 Fibromyalgia: Secondary | ICD-10-CM | POA: Diagnosis not present

## 2022-09-15 DIAGNOSIS — E785 Hyperlipidemia, unspecified: Secondary | ICD-10-CM | POA: Diagnosis not present

## 2022-09-15 DIAGNOSIS — I10 Essential (primary) hypertension: Secondary | ICD-10-CM | POA: Diagnosis not present

## 2022-09-15 DIAGNOSIS — F0153 Vascular dementia, unspecified severity, with mood disturbance: Secondary | ICD-10-CM | POA: Diagnosis not present

## 2022-09-15 DIAGNOSIS — L89626 Pressure-induced deep tissue damage of left heel: Secondary | ICD-10-CM | POA: Diagnosis not present

## 2022-09-19 DIAGNOSIS — I10 Essential (primary) hypertension: Secondary | ICD-10-CM | POA: Diagnosis not present

## 2022-09-19 DIAGNOSIS — E785 Hyperlipidemia, unspecified: Secondary | ICD-10-CM | POA: Diagnosis not present

## 2022-09-19 DIAGNOSIS — L89626 Pressure-induced deep tissue damage of left heel: Secondary | ICD-10-CM | POA: Diagnosis not present

## 2022-09-19 DIAGNOSIS — F0153 Vascular dementia, unspecified severity, with mood disturbance: Secondary | ICD-10-CM | POA: Diagnosis not present

## 2022-09-20 DIAGNOSIS — E785 Hyperlipidemia, unspecified: Secondary | ICD-10-CM | POA: Diagnosis not present

## 2022-09-20 DIAGNOSIS — D649 Anemia, unspecified: Secondary | ICD-10-CM | POA: Diagnosis not present

## 2022-09-20 DIAGNOSIS — M797 Fibromyalgia: Secondary | ICD-10-CM | POA: Diagnosis not present

## 2022-09-25 DIAGNOSIS — M797 Fibromyalgia: Secondary | ICD-10-CM | POA: Diagnosis not present

## 2022-09-25 DIAGNOSIS — I10 Essential (primary) hypertension: Secondary | ICD-10-CM | POA: Diagnosis not present

## 2022-09-25 DIAGNOSIS — I25111 Atherosclerotic heart disease of native coronary artery with angina pectoris with documented spasm: Secondary | ICD-10-CM | POA: Diagnosis not present

## 2022-09-25 DIAGNOSIS — E876 Hypokalemia: Secondary | ICD-10-CM | POA: Diagnosis not present

## 2022-09-26 DIAGNOSIS — E785 Hyperlipidemia, unspecified: Secondary | ICD-10-CM | POA: Diagnosis not present

## 2022-09-26 DIAGNOSIS — I10 Essential (primary) hypertension: Secondary | ICD-10-CM | POA: Diagnosis not present

## 2022-09-26 DIAGNOSIS — L89626 Pressure-induced deep tissue damage of left heel: Secondary | ICD-10-CM | POA: Diagnosis not present

## 2022-09-26 DIAGNOSIS — F0153 Vascular dementia, unspecified severity, with mood disturbance: Secondary | ICD-10-CM | POA: Diagnosis not present

## 2022-09-27 DIAGNOSIS — N390 Urinary tract infection, site not specified: Secondary | ICD-10-CM | POA: Diagnosis not present

## 2022-09-29 DIAGNOSIS — N390 Urinary tract infection, site not specified: Secondary | ICD-10-CM | POA: Diagnosis not present

## 2022-10-02 ENCOUNTER — Emergency Department: Payer: PPO

## 2022-10-02 ENCOUNTER — Other Ambulatory Visit: Payer: Self-pay

## 2022-10-02 ENCOUNTER — Inpatient Hospital Stay
Admission: EM | Admit: 2022-10-02 | Discharge: 2022-10-09 | DRG: 177 | Disposition: A | Discharge status: Skilled Nursing Facility | Payer: PPO | Attending: Internal Medicine | Admitting: Internal Medicine

## 2022-10-02 ENCOUNTER — Telehealth: Payer: PPO

## 2022-10-02 DIAGNOSIS — Z90722 Acquired absence of ovaries, bilateral: Secondary | ICD-10-CM

## 2022-10-02 DIAGNOSIS — S32810D Multiple fractures of pelvis with stable disruption of pelvic ring, subsequent encounter for fracture with routine healing: Secondary | ICD-10-CM | POA: Diagnosis not present

## 2022-10-02 DIAGNOSIS — S32591D Other specified fracture of right pubis, subsequent encounter for fracture with routine healing: Secondary | ICD-10-CM | POA: Diagnosis not present

## 2022-10-02 DIAGNOSIS — M069 Rheumatoid arthritis, unspecified: Secondary | ICD-10-CM | POA: Diagnosis not present

## 2022-10-02 DIAGNOSIS — L4050 Arthropathic psoriasis, unspecified: Secondary | ICD-10-CM | POA: Diagnosis present

## 2022-10-02 DIAGNOSIS — I25111 Atherosclerotic heart disease of native coronary artery with angina pectoris with documented spasm: Secondary | ICD-10-CM | POA: Diagnosis not present

## 2022-10-02 DIAGNOSIS — M6281 Muscle weakness (generalized): Secondary | ICD-10-CM | POA: Diagnosis not present

## 2022-10-02 DIAGNOSIS — N183 Chronic kidney disease, stage 3 unspecified: Secondary | ICD-10-CM | POA: Diagnosis not present

## 2022-10-02 DIAGNOSIS — Z8249 Family history of ischemic heart disease and other diseases of the circulatory system: Secondary | ICD-10-CM

## 2022-10-02 DIAGNOSIS — E785 Hyperlipidemia, unspecified: Secondary | ICD-10-CM | POA: Diagnosis not present

## 2022-10-02 DIAGNOSIS — Z8673 Personal history of transient ischemic attack (TIA), and cerebral infarction without residual deficits: Secondary | ICD-10-CM

## 2022-10-02 DIAGNOSIS — K589 Irritable bowel syndrome without diarrhea: Secondary | ICD-10-CM | POA: Diagnosis not present

## 2022-10-02 DIAGNOSIS — I10 Essential (primary) hypertension: Secondary | ICD-10-CM | POA: Diagnosis not present

## 2022-10-02 DIAGNOSIS — Z9079 Acquired absence of other genital organ(s): Secondary | ICD-10-CM

## 2022-10-02 DIAGNOSIS — E876 Hypokalemia: Secondary | ICD-10-CM | POA: Diagnosis present

## 2022-10-02 DIAGNOSIS — F0153 Vascular dementia, unspecified severity, with mood disturbance: Secondary | ICD-10-CM | POA: Diagnosis not present

## 2022-10-02 DIAGNOSIS — G4733 Obstructive sleep apnea (adult) (pediatric): Secondary | ICD-10-CM | POA: Diagnosis present

## 2022-10-02 DIAGNOSIS — I252 Old myocardial infarction: Secondary | ICD-10-CM | POA: Diagnosis not present

## 2022-10-02 DIAGNOSIS — G629 Polyneuropathy, unspecified: Secondary | ICD-10-CM | POA: Diagnosis not present

## 2022-10-02 DIAGNOSIS — Z888 Allergy status to other drugs, medicaments and biological substances status: Secondary | ICD-10-CM

## 2022-10-02 DIAGNOSIS — L89616 Pressure-induced deep tissue damage of right heel: Secondary | ICD-10-CM | POA: Diagnosis not present

## 2022-10-02 DIAGNOSIS — R402421 Glasgow coma scale score 9-12, in the field [EMT or ambulance]: Secondary | ICD-10-CM | POA: Diagnosis not present

## 2022-10-02 DIAGNOSIS — R0602 Shortness of breath: Secondary | ICD-10-CM | POA: Diagnosis not present

## 2022-10-02 DIAGNOSIS — F32A Depression, unspecified: Secondary | ICD-10-CM | POA: Diagnosis not present

## 2022-10-02 DIAGNOSIS — Z86718 Personal history of other venous thrombosis and embolism: Secondary | ICD-10-CM

## 2022-10-02 DIAGNOSIS — R0902 Hypoxemia: Secondary | ICD-10-CM | POA: Diagnosis not present

## 2022-10-02 DIAGNOSIS — R41841 Cognitive communication deficit: Secondary | ICD-10-CM | POA: Diagnosis not present

## 2022-10-02 DIAGNOSIS — Z66 Do not resuscitate: Secondary | ICD-10-CM | POA: Diagnosis not present

## 2022-10-02 DIAGNOSIS — E663 Overweight: Secondary | ICD-10-CM | POA: Diagnosis not present

## 2022-10-02 DIAGNOSIS — N179 Acute kidney failure, unspecified: Secondary | ICD-10-CM | POA: Diagnosis not present

## 2022-10-02 DIAGNOSIS — J90 Pleural effusion, not elsewhere classified: Secondary | ICD-10-CM | POA: Diagnosis not present

## 2022-10-02 DIAGNOSIS — J302 Other seasonal allergic rhinitis: Secondary | ICD-10-CM | POA: Diagnosis not present

## 2022-10-02 DIAGNOSIS — U071 COVID-19: Secondary | ICD-10-CM | POA: Diagnosis not present

## 2022-10-02 DIAGNOSIS — J9600 Acute respiratory failure, unspecified whether with hypoxia or hypercapnia: Secondary | ICD-10-CM | POA: Diagnosis not present

## 2022-10-02 DIAGNOSIS — J9601 Acute respiratory failure with hypoxia: Secondary | ICD-10-CM | POA: Diagnosis not present

## 2022-10-02 DIAGNOSIS — M797 Fibromyalgia: Secondary | ICD-10-CM | POA: Diagnosis present

## 2022-10-02 DIAGNOSIS — Z79891 Long term (current) use of opiate analgesic: Secondary | ICD-10-CM

## 2022-10-02 DIAGNOSIS — Z6820 Body mass index (BMI) 20.0-20.9, adult: Secondary | ICD-10-CM

## 2022-10-02 DIAGNOSIS — J9811 Atelectasis: Secondary | ICD-10-CM | POA: Diagnosis not present

## 2022-10-02 DIAGNOSIS — R0689 Other abnormalities of breathing: Secondary | ICD-10-CM | POA: Diagnosis not present

## 2022-10-02 DIAGNOSIS — Z7982 Long term (current) use of aspirin: Secondary | ICD-10-CM

## 2022-10-02 DIAGNOSIS — I672 Cerebral atherosclerosis: Secondary | ICD-10-CM | POA: Diagnosis present

## 2022-10-02 DIAGNOSIS — Z90710 Acquired absence of both cervix and uterus: Secondary | ICD-10-CM

## 2022-10-02 DIAGNOSIS — Z515 Encounter for palliative care: Secondary | ICD-10-CM | POA: Diagnosis not present

## 2022-10-02 DIAGNOSIS — I959 Hypotension, unspecified: Secondary | ICD-10-CM | POA: Diagnosis not present

## 2022-10-02 DIAGNOSIS — R339 Retention of urine, unspecified: Secondary | ICD-10-CM | POA: Diagnosis not present

## 2022-10-02 DIAGNOSIS — Z91040 Latex allergy status: Secondary | ICD-10-CM

## 2022-10-02 DIAGNOSIS — M1990 Unspecified osteoarthritis, unspecified site: Secondary | ICD-10-CM | POA: Diagnosis not present

## 2022-10-02 DIAGNOSIS — R55 Syncope and collapse: Secondary | ICD-10-CM | POA: Diagnosis not present

## 2022-10-02 DIAGNOSIS — Z880 Allergy status to penicillin: Secondary | ICD-10-CM

## 2022-10-02 DIAGNOSIS — Z7401 Bed confinement status: Secondary | ICD-10-CM | POA: Diagnosis not present

## 2022-10-02 DIAGNOSIS — R1312 Dysphagia, oropharyngeal phase: Secondary | ICD-10-CM | POA: Diagnosis not present

## 2022-10-02 DIAGNOSIS — R5381 Other malaise: Secondary | ICD-10-CM | POA: Diagnosis not present

## 2022-10-02 DIAGNOSIS — M8580 Other specified disorders of bone density and structure, unspecified site: Secondary | ICD-10-CM | POA: Diagnosis not present

## 2022-10-02 DIAGNOSIS — R404 Transient alteration of awareness: Secondary | ICD-10-CM | POA: Diagnosis not present

## 2022-10-02 DIAGNOSIS — J1282 Pneumonia due to coronavirus disease 2019: Secondary | ICD-10-CM | POA: Diagnosis present

## 2022-10-02 DIAGNOSIS — Z79899 Other long term (current) drug therapy: Secondary | ICD-10-CM

## 2022-10-02 DIAGNOSIS — R2689 Other abnormalities of gait and mobility: Secondary | ICD-10-CM | POA: Diagnosis not present

## 2022-10-02 DIAGNOSIS — Z7189 Other specified counseling: Secondary | ICD-10-CM | POA: Diagnosis not present

## 2022-10-02 LAB — CBC WITH DIFFERENTIAL/PLATELET
Abs Immature Granulocytes: 0.03 10*3/uL (ref 0.00–0.07)
Basophils Absolute: 0 10*3/uL (ref 0.0–0.1)
Basophils Relative: 1 %
Eosinophils Absolute: 0.1 10*3/uL (ref 0.0–0.5)
Eosinophils Relative: 1 %
HCT: 30.3 % — ABNORMAL LOW (ref 36.0–46.0)
Hemoglobin: 9.8 g/dL — ABNORMAL LOW (ref 12.0–15.0)
Immature Granulocytes: 1 %
Lymphocytes Relative: 9 %
Lymphs Abs: 0.6 10*3/uL — ABNORMAL LOW (ref 0.7–4.0)
MCH: 31.7 pg (ref 26.0–34.0)
MCHC: 32.3 g/dL (ref 30.0–36.0)
MCV: 98.1 fL (ref 80.0–100.0)
Monocytes Absolute: 0.8 10*3/uL (ref 0.1–1.0)
Monocytes Relative: 12 %
Neutro Abs: 5.1 10*3/uL (ref 1.7–7.7)
Neutrophils Relative %: 76 %
Platelets: 247 10*3/uL (ref 150–400)
RBC: 3.09 MIL/uL — ABNORMAL LOW (ref 3.87–5.11)
RDW: 15.5 % (ref 11.5–15.5)
WBC: 6.6 10*3/uL (ref 4.0–10.5)
nRBC: 0 % (ref 0.0–0.2)

## 2022-10-02 LAB — RESP PANEL BY RT-PCR (RSV, FLU A&B, COVID)  RVPGX2
Influenza A by PCR: NEGATIVE
Influenza B by PCR: NEGATIVE
Resp Syncytial Virus by PCR: NEGATIVE
SARS Coronavirus 2 by RT PCR: POSITIVE — AB

## 2022-10-02 LAB — URINALYSIS, ROUTINE W REFLEX MICROSCOPIC
Bacteria, UA: NONE SEEN
Bilirubin Urine: NEGATIVE
Glucose, UA: NEGATIVE mg/dL
Hgb urine dipstick: NEGATIVE
Ketones, ur: 5 mg/dL — AB
Leukocytes,Ua: NEGATIVE
Nitrite: NEGATIVE
Protein, ur: NEGATIVE mg/dL
Specific Gravity, Urine: 1.004 — ABNORMAL LOW (ref 1.005–1.030)
Squamous Epithelial / HPF: NONE SEEN /HPF (ref 0–5)
pH: 7 (ref 5.0–8.0)

## 2022-10-02 LAB — PROCALCITONIN: Procalcitonin: <0.1 ng/mL

## 2022-10-02 LAB — COMPREHENSIVE METABOLIC PANEL
ALT: 13 U/L (ref 0–44)
AST: 42 U/L — ABNORMAL HIGH (ref 15–41)
Albumin: 2.9 g/dL — ABNORMAL LOW (ref 3.5–5.0)
Alkaline Phosphatase: 133 U/L — ABNORMAL HIGH (ref 38–126)
Anion gap: 10 (ref 5–15)
BUN: 14 mg/dL (ref 8–23)
CO2: 22 mmol/L (ref 22–32)
Calcium: 8.6 mg/dL — ABNORMAL LOW (ref 8.9–10.3)
Chloride: 106 mmol/L (ref 98–111)
Creatinine, Ser: 1.15 mg/dL — ABNORMAL HIGH (ref 0.44–1.00)
GFR, Estimated: 48 mL/min — ABNORMAL LOW (ref >60)
Glucose, Bld: 119 mg/dL — ABNORMAL HIGH (ref 70–99)
Potassium: 3.3 mmol/L — ABNORMAL LOW (ref 3.5–5.1)
Sodium: 138 mmol/L (ref 135–145)
Total Bilirubin: 0.9 mg/dL (ref 0.3–1.2)
Total Protein: 6.4 g/dL — ABNORMAL LOW (ref 6.5–8.1)

## 2022-10-02 LAB — MAGNESIUM: Magnesium: 2 mg/dL (ref 1.7–2.4)

## 2022-10-02 LAB — BRAIN NATRIURETIC PEPTIDE: B Natriuretic Peptide: 115 pg/mL — ABNORMAL HIGH (ref 0.0–100.0)

## 2022-10-02 LAB — TROPONIN I (HIGH SENSITIVITY): Troponin I (High Sensitivity): 21 ng/L — ABNORMAL HIGH (ref <18)

## 2022-10-02 MED ORDER — DULOXETINE HCL 30 MG PO CPEP
30.0000 mg | ORAL_CAPSULE | Freq: Two times a day (BID) | ORAL | Status: DC
  Administered 2022-10-02 – 2022-10-09 (×14): 30 mg via ORAL
  Filled 2022-10-02 (×14): qty 1

## 2022-10-02 MED ORDER — ADULT MULTIVITAMIN W/MINERALS CH
1.0000 | ORAL_TABLET | Freq: Every day | ORAL | Status: DC
  Administered 2022-10-03 – 2022-10-09 (×6): 1 via ORAL
  Filled 2022-10-02 (×6): qty 1

## 2022-10-02 MED ORDER — TRAZODONE HCL 50 MG PO TABS: 25.0000 mg | ORAL_TABLET | Freq: Every evening | ORAL | Status: DC | PRN

## 2022-10-02 MED ORDER — SENNOSIDES-DOCUSATE SODIUM 8.6-50 MG PO TABS
1.0000 | ORAL_TABLET | Freq: Every evening | ORAL | Status: DC | PRN
  Administered 2022-10-03: 1 via ORAL
  Filled 2022-10-02: qty 1

## 2022-10-02 MED ORDER — SODIUM CHLORIDE 0.9 % IV BOLUS
1000.0000 mL | Freq: Once | INTRAVENOUS | Status: AC
  Administered 2022-10-02: 1000 mL via INTRAVENOUS

## 2022-10-02 MED ORDER — DONEPEZIL HCL 5 MG PO TABS
10.0000 mg | ORAL_TABLET | Freq: Every day | ORAL | Status: DC
  Administered 2022-10-02 – 2022-10-08 (×7): 10 mg via ORAL
  Filled 2022-10-02 (×7): qty 2

## 2022-10-02 MED ORDER — GABAPENTIN 400 MG PO CAPS
400.0000 mg | ORAL_CAPSULE | Freq: Two times a day (BID) | ORAL | Status: DC
  Administered 2022-10-03 – 2022-10-09 (×13): 400 mg via ORAL
  Filled 2022-10-02 (×13): qty 1

## 2022-10-02 MED ORDER — LACTATED RINGERS IV SOLN: INTRAVENOUS | Status: AC

## 2022-10-02 MED ORDER — ACETAMINOPHEN 325 MG PO TABS
650.0000 mg | ORAL_TABLET | Freq: Four times a day (QID) | ORAL | Status: AC | PRN
  Administered 2022-10-06 – 2022-10-07 (×2): 650 mg via ORAL
  Filled 2022-10-02 (×2): qty 2

## 2022-10-02 MED ORDER — ONDANSETRON HCL 4 MG/2ML IJ SOLN: 4.0000 mg | Freq: Four times a day (QID) | INTRAMUSCULAR | Status: AC | PRN

## 2022-10-02 MED ORDER — IOHEXOL 350 MG/ML SOLN
50.0000 mL | Freq: Once | INTRAVENOUS | Status: AC | PRN
  Administered 2022-10-02: 50 mL via INTRAVENOUS

## 2022-10-02 MED ORDER — ACETAMINOPHEN 650 MG RE SUPP: 650.0000 mg | Freq: Four times a day (QID) | RECTAL | Status: AC | PRN

## 2022-10-02 MED ORDER — ONDANSETRON HCL 4 MG PO TABS: 4.0000 mg | ORAL_TABLET | Freq: Four times a day (QID) | ORAL | Status: AC | PRN

## 2022-10-02 MED ORDER — ENOXAPARIN SODIUM 40 MG/0.4ML IJ SOSY
40.0000 mg | PREFILLED_SYRINGE | INTRAMUSCULAR | Status: DC
  Administered 2022-10-02 – 2022-10-08 (×7): 40 mg via SUBCUTANEOUS
  Filled 2022-10-02 (×7): qty 0.4

## 2022-10-02 NOTE — H&P (Addendum)
History and Physical   Kristin [name] [id number] DOB: 02/22/1941 DOA: 10/02/2022  PCP: Kristin Fenton, NP  Patient coming from: Home via EMS  I have personally briefly reviewed patient's old medical records in [place].  Chief Concern: Hypertension, weakness  HPI: [name], hypertension, peripheral neuropathy, arteriosclerotic dementia with depression, [name], [name], [name], who presents to the emergency department for chief concerns of hypotension and loss of consciousness.  Initial vitals in the ED showed temperature of 97.4, respiration rate of 12, heart rate of 61, blood pressure 104/33, SpO2 of 95% on 2 L nasal cannula.  Serum sodium is 138, potassium 3.2, chloride 106, bicarb 22, BUN of 14, serum creatinine 1.15, GFR 48, nonfasting blood glucose 119, WBC 6.6, hemoglobin 9.8, platelets of 247.  BNP was 115, high sensitive troponin was 21.  Patient tested positive for COVID-19. UA was negative for leukocytes and nitrates.  Blood cultures x 2 have been ordered are in process.  ED treatment: Sodium chloride 1 L bolus ---------------------------- At bedside, she is able to tell me her full name. She was not able to tell me her age, current month, current calendar year, current location.   She states she lives across the street with her husband. She does not know why she is in the hospital. Recently she does not know her son's name until he tells her that he is her son, Kristin [name].  She has been be prompted of her husband and son's names, with queing of the first letter.   Son, Kristin [name] denies reported fever, vomiting, diarrhea. He endorses that she has had poor intake. He states that she pockets her food and she does not initiate PO intake, including water, food.   Social history: [facility].for pelvic fracture.   ROS: unable to complete, patient has advanced dementia  ED Course: Discussed with emergency  medicine provider, patient requiring hospitalization for chief concerns of acute hypoxic respiratory failure requiring 2 L nasal cannula testing positive for COVID-19.  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 intake and continued antihypertensive medication use in setting of polypharmacy: Patient is prescribed oxycodone-acetaminophen 5-325 mg, trazodone 50 mg nightly, gabapentin 400 mg p.o. twice daily - Holding home oxycodone-acetaminophen at this time - Gabapentin 400 mg p.o. twice daily resumed for 10/03/2022, would recommend PCP to consider decreasing dose gradually - Resolved with sodium chloride 1 L bolus - LR infusion at 125 mL/h, 1 day ordered  Hypokalemia - Check magnesium level  Essential hypertension - Home diltiazem 240 mg p.o. 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 twice daily - Trazodone 25  mg nightly as needed for sleep  COVID-19 - Airborne and contact precautions - On my evaluation with patient at bedside, her hypoxia has improved and patient is currently  satting at 97% on room air  Chart reviewed.   DVT prophylaxis: Enoxaparin 40 mg subcutaneous every 24 hours Code Status: DNR Diet: Heart healthy Family Communication: Called and discussed with son, Kristin [name] at [phone] Disposition Plan: Pending clinical coursedischarge on 10/03/2022 Consults called: TOC Admission status: Telemetry medical, inpatient  Past Medical History:  Diagnosis Date   Allergy    Arthritis    recent falls -aug 2010   [name] (coronary artery disease)    [id number]   Complication of anesthesia    pt states takes a long time to wake her wake up   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 ([place]) 08/27/2015   Cough    no fever    Degeneration of lumbar or lumbosacral intervertebral disc    Dizziness    r/t meds   DVT (deep venous thrombosis) (HCC)    Fibromyalgia    History of shingles    [name] (irritable bowel syndrome)    takes OTC [place] Negus colon health   Joint pain    Joint swelling    Myalgia and myositis, unspecified    Obstructive sleep apnea (adult) (pediatric)    Old myocardial infarction 1996   Osteoarthritis    Osteoarthrosis, unspecified whether generalized or localized, unspecified site    Psoriatic arthritis ([place])    Rheumatoid arthritis ([place])    Rosacea    Seasonal allergies    Stroke ([place])    ischaemic microvascular disease   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[place])    1991 , bleed and dizziness    Unspecified essential hypertension    takes Diltiazem,Metoprolol,and Losartan daily   UTI (lower urinary tract infection)    frequent=last one was winter of 2014   Past Surgical History:  Procedure Laterality Date   ATHERECTOMY  [place]   vasospams; no blockage   CARDIAC CATHETERIZATION  2013   No obstructive coronary artery disease. Mild distal LAD and proximal RCA disease.   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 She has never used smokeless tobacco. She reports that she does not drink alcohol and does not use drugs.  Allergies  Allergen Reactions   Angiotensin Receptor Blockers Anaphylaxis    Fatigue & Dizziness   Other Anaphylaxis    Other reaction(s): Other (See Comments) Dizziness, rash Fatigue & Dizziness   Hydralazine Other (See Comments)    Unknown   Latex     REACTION: Rash, blisters, breathing probs.   Losartan     Dizziness    Methotrexate Derivatives     Dizziness, rash    Spironolactone  Dizziness    Penicillins Hives and Rash   Family History  Problem Relation Age of Onset   Colon cancer Father    Heart attack Mother    Coronary artery disease Mother    Osteoporosis Mother    Heart disease Mother    Heart failure Mother    GER disease Sister    Skin cancer Sister    Hypertension Sister    Osteopenia Sister    GER disease Sister    Breast cancer Paternal Aunt        age 59   Heart failure Maternal Grandmother    Heart failure Maternal Grandfather    Family history: Family history reviewed and not pertinent.  Prior to Admission medications   Medication Sig Start Date End Date Taking? Authorizing Provider  acetaminophen (TYLENOL) 325 MG tablet Take 650 mg by mouth every 8 (eight) hours as needed (pain).    [provider]  aspirin 81 MG chewable tablet Chew 81 mg by mouth daily.    [provider]  Calcium  Carbonate-Vitamin D (CALTRATE 600+D PO) Take 1 tablet by mouth daily at 12 noon.    [provider]  cloNIDine (CATAPRES) 0.2 MG tablet Take 1 tablet (0.2 mg total) by mouth 3 (three) times daily. 08/30/22   Hollice Gong, Mir M, MD  Cranberry 400 MG TABS Take 1 tablet by mouth daily.    [provider]  diclofenac Sodium (VOLTAREN) 1 % GEL Apply 2 g topically 4 (four) times daily as needed.    [provider]  diltiazem (CARDIZEM CD) 240 MG 24 hr capsule TAKE 1 CAPSULE BY MOUTH DAILY 07/03/22   Wellington Hampshire, MD  donepezil (ARICEPT) 10 MG tablet Take 10 mg by mouth at bedtime. 11/02/20 01/26/23  [provider]  DULoxetine (CYMBALTA) 30 MG capsule Take 1 capsule (30 mg total) by mouth 2 (two) times daily. 08/30/22   Hollice Gong, Mir M, MD  feeding supplement (ENSURE ENLIVE / ENSURE PLUS) LIQD Take 237 mLs by mouth 3 (three) times daily between meals. 08/30/22   Hollice Gong, Mir M, MD  gabapentin (NEURONTIN) 400 MG capsule Take 400 mg by mouth 2 (two) times daily. 01/23/22   [provider]  Lactobacillus Rhamnosus, GG, (CULTURELLE PO) Take 1 capsule by mouth daily.    [provider]  memantine (NAMENDA) 10 MG tablet TAKE 1 TABLET BY MOUTH TWICE DAILY Patient not taking: Reported on 08/24/2022 03/28/21   Kristin Fenton, NP  Multiple Vitamin (MULTIVITAMIN) tablet Take 1 tablet by mouth daily.    [provider]  Omega-3 Fatty Acids (FISH OIL) 1200 MG CAPS Take 1 capsule by mouth daily.     [provider]  oxyCODONE-acetaminophen (PERCOCET/ROXICET) 5-325 MG tablet Take 1 tablet by mouth every 4 (four) hours as needed for moderate pain or severe pain. 08/30/22   Hollice Gong, Mir M, MD  Polyethyl Glycol-Propyl Glycol 0.4-0.3 % SOLN Apply 1 drop to eye daily as needed (dry eyes).    [provider]  Red Yeast Rice 600 MG CAPS Take 2 capsules by mouth daily.    [provider]  traZODone (DESYREL) 50 MG tablet Take 0.5  tablets (25 mg total) by mouth at bedtime as needed for sleep. 08/30/22 09/29/22  Hollice Gong, Mir M, MD  gabapentin (NEURONTIN) 100 MG capsule Take 200 mg by mouth 2 (two) times daily. 04/27/21   [provider]   Physical Exam: Vitals:   10/02/22 1800 10/02/22 1815 10/02/22 1830 10/02/22 1952  BP:   (!) 152/46 (!) 157/73  Pulse:   64 77  Resp: 11 (!) 21 19 19  $ Temp:    97.8 F (36.6 C)  TempSrc:    Oral  SpO2:   94% 97%  Weight:       Constitutional: appears frail, age-appropriate, NAD, calm, comfortable Eyes: PERRL, lids and conjunctivae normal ENMT: Mucous membranes are moist. Posterior pharynx clear of any exudate or lesions. Age-appropriate dentition. Hearing appropriate Neck: normal, supple, no masses, no thyromegaly Respiratory: clear to auscultation bilaterally, no wheezing, no crackles. Normal respiratory effort. No accessory muscle use.  Cardiovascular: Regular rate and rhythm, no murmurs / rubs / gallops. No extremity edema. 2+ pedal pulses. No carotid bruits.  Abdomen: no tenderness, no masses palpated, no hepatosplenomegaly. Bowel sounds positive.  Musculoskeletal: no clubbing / cyanosis. No joint deformity upper and lower extremities. Good ROM, no contractures, no atrophy. Normal muscle tone.  Skin: no rashes, lesions, ulcers. No induration Neurologic: Sensation intact. Strength 5/5 in all 4.  Psychiatric: Lacks judgment and insight, consistent with dementia. Alert and oriented x self only.  Depressed mood.   EKG: independently reviewed, showing junctional rhythm with rate of 56, QTc 476  Chest x-ray on Admission: I personally reviewed and I agree with radiologist reading as below.  CT Angio Chest PE W/Cm &/Or Wo Cm  Result Date: 10/02/2022 CLINICAL DATA:  COVID hypotensive EXAM: CT ANGIOGRAPHY CHEST WITH CONTRAST TECHNIQUE: Multidetector CT imaging of the chest was performed using the standard protocol during bolus administration of intravenous contrast.  Multiplanar CT image reconstructions and MIPs were obtained to evaluate the vascular anatomy. RADIATION DOSE REDUCTION: This exam was performed according to the departmental dose-optimization program which includes automated exposure control, adjustment of the mA and/or kV according to patient size and/or use of iterative reconstruction technique. CONTRAST:  72m OMNIPAQUE IOHEXOL 350 MG/ML SOLN COMPARISON:  Chest x-ray 10/02/2022 FINDINGS: Cardiovascular: Satisfactory opacification of the pulmonary arteries to the segmental level. No evidence of pulmonary embolism. Normal heart size. No significant pericardial effusion. Nonaneurysmal aorta. No dissection is seen. Moderate atherosclerosis. Coronary vascular calcification. Mediastinum/Nodes: Midline trachea. No thyroid mass. No suspicious lymph nodes. Esophagus within normal limits. Lungs/Pleura: Trace pleural effusions and mild dependent atelectasis. No acute airspace disease. Upper Abdomen: No acute finding. Multiple hepatic cysts with dominant 12.6 by 10.1 by 11.2 cm right hepatic cyst. Indeterminate hypodense mass at the left hepatic lobe inferior to the falciform ligament region, this measures 2.4 by 2.1 cm. Some peripheral calcification. Musculoskeletal: No chest wall abnormality. No acute or significant osseous findings. Review of the MIP images confirms the above findings. IMPRESSION: 1. Negative for acute pulmonary embolus or aortic dissection. Trace pleural effusions with mild dependent atelectasis. No acute airspace disease. 2. Indeterminate 2.4 cm hypodense mass in the left hepatic lobe. When the patient is clinically stable and able to follow directions and hold their breath (preferably as an outpatient) further evaluation with dedicated abdominal MRI should be considered. Aortic Atherosclerosis (ICD10-I70.0). Electronically Signed   By: KDonavan FoilM.D.   On: 10/02/2022 17:21   DG Chest 1 View  Result Date: 10/02/2022 CLINICAL DATA:  Shortness of  breath in a female at age 82 EXAM: CHEST  1 VIEW COMPARISON:  Comparison made with August 23, 2022 FINDINGS: Cardiomediastinal contours and hilar structures are stable with mild cardiac enlargement and central pulmonary vascular engorgement suggested. No lobar level consolidation. Mild elevation of the RIGHT hemidiaphragm. Mild increase in retrocardiac airspace disease mainly with linear appearance. Mild  blunting of LEFT costodiaphragmatic sulcus. No pneumothorax. On limited assessment no acute skeletal process. IMPRESSION: 1. Mild cardiac enlargement and central pulmonary vascular engorgement. 2. Mild increase in retrocardiac airspace disease mainly with linear appearance, potentially atelectasis though possibly so seated with small LEFT-sided pleural effusion. Seen in the LEFT lung base and LEFT costodiaphragmatic sulcus. Electronically Signed   By: Zetta Bills M.D.   On: 10/02/2022 13:21    Labs on Admission: I have personally reviewed following labs  CBC: Recent Labs  Lab 10/02/22 1430  WBC 6.6  NEUTROABS 5.1  HGB 9.8*  HCT 30.3*  MCV 98.1  PLT [id number]   Basic Metabolic Panel: Recent Labs  Lab 10/02/22 1430  NA 138  K 3.3*  CL 106  CO2 22  GLUCOSE 119*  BUN 14  CREATININE 1.15*  CALCIUM 8.6*  MG 2.0   GFR: Estimated Creatinine Clearance: 36.7 mL/min (A) (by C-G formula based on SCr of 1.15 mg/dL (H)).  Liver Function Tests: Recent Labs  Lab 10/02/22 1430  AST 42*  ALT 13  ALKPHOS 133*  BILITOT 0.9  PROT 6.4*  ALBUMIN 2.9*   Urine analysis:    Component Value Date/Time   COLORURINE STRAW (A) 10/02/2022 1500   APPEARANCEUR CLEAR (A) 10/02/2022 1500   APPEARANCEUR Cloudy (A) 01/02/2020 1538   LABSPEC 1.004 (L) 10/02/2022 1500   PHURINE 7.0 10/02/2022 1500   GLUCOSEU NEGATIVE 10/02/2022 1500   HGBUR NEGATIVE 10/02/2022 1500   BILIRUBINUR NEGATIVE 10/02/2022 1500   BILIRUBINUR negative 04/12/2020 0906   BILIRUBINUR Negative 06/19/2019 1421   KETONESUR 5 (A)  10/02/2022 1500   PROTEINUR NEGATIVE 10/02/2022 1500   UROBILINOGEN 0.2 04/12/2020 0906   UROBILINOGEN 0.2 03/31/2014 0939   NITRITE NEGATIVE 10/02/2022 1500   LEUKOCYTESUR NEGATIVE 10/02/2022 1500   This document was prepared using Dragon Voice Recognition software and may include unintentional dictation errors.  Dr. Tobie Poet [facility]  If 7PM-7AM, please contact overnight-coverage provider If 7AM-7PM, please contact day coverage provider [email or web address]  10/02/2022, 10:35 PM

## 2022-10-02 NOTE — Assessment & Plan Note (Addendum)
Airborne and contact precautions Supportive care

## 2022-10-02 NOTE — Assessment & Plan Note (Addendum)
Supplement potassium Check magnesium level

## 2022-10-02 NOTE — Hospital Course (Addendum)
Kristin [name] is an 82 year old female with history of fibromyalgia, hypertension, peripheral neuropathy, arteriosclerotic dementia with depression, [name], [name], [name], who presents to the emergency department for chief concerns of hypotension and loss of consciousness.  Initial vitals in the ED showed temperature of 97.4, respiration rate of 12, heart rate of 61, blood pressure 104/33, SpO2 of 95% on 2 L nasal cannula.  Serum sodium is 138, potassium 3.2, chloride 106, bicarb 22, BUN of 14, serum creatinine 1.15, GFR 48, nonfasting blood glucose 119, WBC 6.6, hemoglobin 9.8, platelets of 247.  BNP was 115, high sensitive troponin was 21.  Patient tested positive for COVID-19. UA was negative for leukocytes and nitrates.  Blood cultures x 2 have been ordered are in process.  ED treatment: Sodium chloride 1 L bolus

## 2022-10-02 NOTE — Assessment & Plan Note (Addendum)
-   With reported passing out at facility - Presumed secondary to poor p.o. intake and continued antihypertensive medication use in setting of polypharmacy: Patient is prescribed oxycodone-acetaminophen 5-325 mg, trazodone 50 mg nightly, gabapentin 400 mg p.o. twice daily Improved blood pressure control with IV fluid hydration

## 2022-10-02 NOTE — ED Provider Notes (Signed)
Care assumed of patient from outgoing provider.  See their note for initial history, exam and plan.  Clinical Course as of 10/02/22 1559  Mon Oct 02, 2022  1504 SNF with COVID and soft BPs and mild hypoxia with 2L.  Plan for CTA. Admit [SM]    Clinical Course User Index [SM] Nathaniel Man, MD   Patient with acute hypoxia, tested positive for COVID.  Requiring 2 L nasal cannula.  CT angiography without signs of pulmonary embolism.  Incidental finding to the liver.  Consulted hospitalist for admission for acute hypoxic respiratory failure in the setting of COVID-pneumonia.   Nathaniel Man, MD 10/02/22 1733

## 2022-10-02 NOTE — ED Provider Notes (Signed)
[facility] Provider Note   Event Date/Time   First MD Initiated Contact with Patient 10/02/22 1253     (approximate) History  Loss of Consciousness (Pt arrives via EMS c/o LOC and hypotensive. Pt was d/x w/ covid yesterday. Pt arrived A/O to self and place. Pt has a DNR.)  HPI Kristin [name] is a 82 y.o. female who presents from her long-term care facility via EMS after she was found altered and hypotensive in her facility.  Patient was diagnosed with COVID yesterday and facility was concerned that patient's altered mental status may be secondary to the COVID infection.  Patient has no reported history of COPD or asthma.  Patient was requiring 2 L nasal cannula for hypoxia to 88% on room air.  Patient somewhat confused but is oriented to self and place. ROS: Patient currently denies any vision changes, tinnitus, difficulty speaking, facial droop, sore throat, chest pain, abdominal pain, nausea/vomiting/diarrhea, dysuria, or weakness/numbness/paresthesias in any extremity   Physical Exam  Triage Vital Signs: ED Triage Vitals  Enc Vitals Group     BP      Pulse      Resp      Temp      Temp src      SpO2      Weight      Height      Head Circumference      Peak Flow      Pain Score      Pain Loc      Pain Edu?      Excl. in [place]?    Most recent vital signs: Vitals:   10/02/22 1259  BP: (!) 104/33  Pulse: 61  Resp: 12  Temp: (!) 97.4 F (36.3 C)  SpO2: 95%   General: Awake, oriented x2. CV:  Good peripheral perfusion.  Resp:  Normal effort.  Abd:  No distention.  Other:  Elderly overweight Caucasian female laying in bed in no acute distress ED Results / Procedures / Treatments  Labs (all labs ordered are listed, but only abnormal results are displayed) Labs Reviewed  RESP PANEL BY RT-PCR (RSV, FLU A&B, COVID)  RVPGX2 - Abnormal; Notable for the following components:      Result Value   SARS Coronavirus 2 by RT PCR POSITIVE (*)    All other  components within normal limits  COMPREHENSIVE METABOLIC PANEL - Abnormal; Notable for the following components:   Potassium 3.3 (*)    Glucose, Bld 119 (*)    Creatinine, Ser 1.15 (*)    Calcium 8.6 (*)    Total Protein 6.4 (*)    Albumin 2.9 (*)    AST 42 (*)    Alkaline Phosphatase 133 (*)    GFR, Estimated 48 (*)    All other components within normal limits  CBC WITH DIFFERENTIAL/PLATELET - Abnormal; Notable for the following components:   RBC 3.09 (*)    Hemoglobin 9.8 (*)    HCT 30.3 (*)    Lymphs Abs 0.6 (*)    All other components within normal limits  BRAIN NATRIURETIC PEPTIDE - Abnormal; Notable for the following components:   B Natriuretic Peptide 115.0 (*)    All other components within normal limits  TROPONIN I (HIGH SENSITIVITY) - Abnormal; Notable for the following components:   Troponin I (High Sensitivity) 21 (*)    All other components within normal limits  CULTURE, BLOOD (ROUTINE X 2)  CULTURE, BLOOD (ROUTINE X 2)  URINALYSIS,  ROUTINE W REFLEX MICROSCOPIC  TROPONIN I (HIGH SENSITIVITY)   EKG ED ECG REPORT I, Naaman Plummer, the attending physician, personally viewed and interpreted this ECG. Date: 10/02/2022 EKG Time: 1420 Rate: 56 Rhythm: normal sinus rhythm QRS Axis: normal Intervals: normal ST/T Wave abnormalities: normal Narrative Interpretation: no evidence of acute ischemia RADIOLOGY ED MD interpretation: Single view portable chest x-ray shows mild cardiac enlargement and central pulmonary vascular engorgement as well as mild increase in ventricular cardiac airspace disease -Agree with radiology assessment Official radiology report(s): DG Chest 1 View  Result Date: 10/02/2022 CLINICAL DATA:  Shortness of breath in a female at age 53. EXAM: CHEST  1 VIEW COMPARISON:  Comparison made with August 23, 2022 FINDINGS: Cardiomediastinal contours and hilar structures are stable with mild cardiac enlargement and central pulmonary vascular engorgement  suggested. No lobar level consolidation. Mild elevation of the RIGHT hemidiaphragm. Mild increase in retrocardiac airspace disease mainly with linear appearance. Mild blunting of LEFT costodiaphragmatic sulcus. No pneumothorax. On limited assessment no acute skeletal process. IMPRESSION: 1. Mild cardiac enlargement and central pulmonary vascular engorgement. 2. Mild increase in retrocardiac airspace disease mainly with linear appearance, potentially atelectasis though possibly so seated with small LEFT-sided pleural effusion. Seen in the LEFT lung base and LEFT costodiaphragmatic sulcus. Electronically Signed   By: Zetta Bills M.D.   On: 10/02/2022 13:21   PROCEDURES: Critical Care performed: Yes, see critical care procedure note(s) .1-3 Lead EKG Interpretation  Performed by: Naaman Plummer, MD Authorized by: Naaman Plummer, MD     Interpretation: normal     ECG rate:  71   ECG rate assessment: normal     Rhythm: sinus rhythm     Ectopy: none     Conduction: normal    MEDICATIONS ORDERED IN ED: Medications  sodium chloride 0.9 % bolus 1,000 mL (has no administration in time range)   IMPRESSION / MDM / ASSESSMENT AND PLAN / ED COURSE  I reviewed the triage vital signs and the nursing notes.                             The patient is on the cardiac monitor to evaluate for evidence of arrhythmia and/or significant heart rate changes. Patient's presentation is most consistent with acute presentation with potential threat to life or bodily function. Presentation most consistent with Viral Syndrome.  Patient has tested positive for COVID-19. At this time patient is requiring submental oxygenation due to acute hypoxic respiratory failure.  Given History and Exam I have a lower suspicion for: Emergent CardioPulmonary causes [such as Acute Asthma or COPD Exacerbation, acute Heart Failure or exacerbation, PE, PTX, atypical ACS, PNA]. Emergent Otolaryngeal causes [such as PTA, RPA, Ludwigs,  Epiglottitis, EBV].  Regarding Emergent Travel or Immunosuppressive related infectious: I have a low suspicion for acute HIV.  Given radiologic evidence for patchy bilateral airspace opacities concerning for viral pneumonia, continued need for supplemental oxygenation due to acute hypoxic respiratory failure, and need for further evaluation and management, patient will require admission  Patient pending a CT angiography of the chest prior to admission Care of this patient will be signed out to the oncoming physician at the end of my shift.  All pertinent patient information conveyed and all questions answered.  All further care and disposition decisions will be made by the oncoming physician. Clinical Course as of 10/02/22 1547  Mon Oct 02, 2022  1504 SNF with COVID and soft  BPs and mild hypoxia with 2L.  Plan for CTA. Admit [SM]    Clinical Course User Index [SM] Nathaniel Man, MD   FINAL CLINICAL IMPRESSION(S) / ED DIAGNOSES   Final diagnoses:  COVID-19 virus infection   Rx / DC Orders   ED Discharge Orders     None      Note:  This document was prepared using Dragon voice recognition software and may include unintentional dictation errors.   Naaman Plummer, MD 10/02/22 [phone]

## 2022-10-02 NOTE — Assessment & Plan Note (Signed)
Will resume diltiazem but will continue to hold clonidine

## 2022-10-02 NOTE — Assessment & Plan Note (Addendum)
Per ED report, patient dropped her pulse oximetry to 88% on room air and patient did not have baseline O2 supplementation requirements - Presumed secondary to COVID-19 Will attempt to wean off oxygen once acute illness improves or resolves

## 2022-10-02 NOTE — ED Triage Notes (Signed)
Pt arrives via EMS c/o LOC and hypotensive. Pt was d/x w/ covid yesterday. Pt arrived A/O to self and place. Pt has a DNR.

## 2022-10-02 NOTE — ED Notes (Signed)
The pt has become increasingly agitated after the departure of her husband for the evening. The pt has attempted to climb out of the bed and remove the monitoring from her body. This RN has placed the pt in a room directly in front of the nursing station, placed the exit alarm on the pt's bed and monitoring cords hidden. Close monitoring continued.

## 2022-10-02 NOTE — Assessment & Plan Note (Signed)
Continue donepezil 10 mg nightly, duloxetine 30 mg p.o. twice daily - Trazodone 25 mg nightly as needed for sleep

## 2022-10-03 DIAGNOSIS — J9601 Acute respiratory failure with hypoxia: Secondary | ICD-10-CM | POA: Diagnosis not present

## 2022-10-03 LAB — BLOOD CULTURE ID PANEL (REFLEXED) - BCID2

## 2022-10-03 LAB — BASIC METABOLIC PANEL
Anion gap: 11 (ref 5–15)
Anion gap: 10 (ref 5–15)
BUN: 8 mg/dL (ref 8–23)
BUN: <5 mg/dL — ABNORMAL LOW (ref 8–23)
CO2: 26 mmol/L (ref 22–32)
CO2: 25 mmol/L (ref 22–32)
Calcium: 8.3 mg/dL — ABNORMAL LOW (ref 8.9–10.3)
Calcium: 8.5 mg/dL — ABNORMAL LOW (ref 8.9–10.3)
Chloride: 104 mmol/L (ref 98–111)
Chloride: 102 mmol/L (ref 98–111)
Creatinine, Ser: 0.78 mg/dL (ref 0.44–1.00)
Creatinine, Ser: 0.62 mg/dL (ref 0.44–1.00)
GFR, Estimated: >60 mL/min (ref >60)
GFR, Estimated: >60 mL/min (ref >60)
Glucose, Bld: 89 mg/dL (ref 70–99)
Glucose, Bld: 91 mg/dL (ref 70–99)
Potassium: 2.5 mmol/L — CL (ref 3.5–5.1)
Potassium: 3.4 mmol/L — ABNORMAL LOW (ref 3.5–5.1)
Sodium: 141 mmol/L (ref 135–145)
Sodium: 137 mmol/L (ref 135–145)

## 2022-10-03 LAB — CBC
HCT: 29.4 % — ABNORMAL LOW (ref 36.0–46.0)
Hemoglobin: 9.6 g/dL — ABNORMAL LOW (ref 12.0–15.0)
MCH: 31.4 pg (ref 26.0–34.0)
MCHC: 32.7 g/dL (ref 30.0–36.0)
MCV: 96.1 fL (ref 80.0–100.0)
Platelets: 260 10*3/uL (ref 150–400)
RBC: 3.06 MIL/uL — ABNORMAL LOW (ref 3.87–5.11)
RDW: 15.1 % (ref 11.5–15.5)
WBC: 5.4 10*3/uL (ref 4.0–10.5)
nRBC: 0 % (ref 0.0–0.2)

## 2022-10-03 LAB — MRSA NEXT GEN BY PCR, NASAL: MRSA by PCR Next Gen: NOT DETECTED

## 2022-10-03 LAB — TROPONIN I (HIGH SENSITIVITY)
Troponin I (High Sensitivity): 25 ng/L — ABNORMAL HIGH (ref <18)
Troponin I (High Sensitivity): 28 ng/L — ABNORMAL HIGH (ref <18)

## 2022-10-03 LAB — MAGNESIUM: Magnesium: 1.6 mg/dL — ABNORMAL LOW (ref 1.7–2.4)

## 2022-10-03 MED ORDER — POTASSIUM CHLORIDE CRYS ER 20 MEQ PO TBCR
40.0000 meq | EXTENDED_RELEASE_TABLET | Freq: Four times a day (QID) | ORAL | Status: DC
  Administered 2022-10-03: 40 meq via ORAL
  Filled 2022-10-03: qty 2

## 2022-10-03 MED ORDER — POTASSIUM CHLORIDE CRYS ER 20 MEQ PO TBCR: 40.0000 meq | EXTENDED_RELEASE_TABLET | Freq: Once | ORAL | Status: DC

## 2022-10-03 MED ORDER — POTASSIUM CHLORIDE 20 MEQ PO PACK
40.0000 meq | PACK | Freq: Once | ORAL | Status: AC
  Administered 2022-10-03: 40 meq via ORAL
  Filled 2022-10-03: qty 2

## 2022-10-03 MED ORDER — ORAL CARE MOUTH RINSE: 15.0000 mL | OROMUCOSAL | Status: DC | PRN

## 2022-10-03 MED ORDER — MAGNESIUM SULFATE 2 GM/50ML IV SOLN
2.0000 g | Freq: Once | INTRAVENOUS | Status: AC
  Administered 2022-10-03: 2 g via INTRAVENOUS
  Filled 2022-10-03: qty 50

## 2022-10-03 MED ORDER — DILTIAZEM HCL ER COATED BEADS 120 MG PO CP24
240.0000 mg | ORAL_CAPSULE | Freq: Every day | ORAL | Status: DC
  Filled 2022-10-03: qty 2

## 2022-10-03 MED ORDER — ENSURE ENLIVE PO LIQD
237.0000 mL | Freq: Two times a day (BID) | ORAL | Status: DC
  Administered 2022-10-04: 237 mL via ORAL

## 2022-10-03 MED ORDER — DILTIAZEM HCL 60 MG PO TABS
60.0000 mg | ORAL_TABLET | Freq: Four times a day (QID) | ORAL | Status: DC
  Administered 2022-10-03 – 2022-10-09 (×20): 60 mg via ORAL
  Filled 2022-10-03 (×25): qty 1

## 2022-10-03 MED ORDER — CHLORHEXIDINE GLUCONATE CLOTH 2 % EX PADS
6.0000 | MEDICATED_PAD | Freq: Every day | CUTANEOUS | Status: DC
  Administered 2022-10-03 – 2022-10-09 (×7): 6 via TOPICAL

## 2022-10-03 NOTE — Progress Notes (Signed)
       CROSS COVER NOTE  NAME: Kristin [name] MRN: [id number] DOB : 02/26/1941 ATTENDING PHYSICIAN: Cox, Amy N, DO    Date of Service   10/03/2022   HPI/Events of Note   Notified of critical K--> 2.5.  Interventions   Assessment/Plan: 40 mEQ PO K x2      To reach the provider On-Call:   7AM- 7PM see care teams to locate the attending and reach out to them via [email or web address] Password: [email or web address] 7PM-7AM contact night-coverage If you still have difficulty reaching the appropriate provider, please page the [facility] (Director on Call) for [facility] on amion for assistance  This document was prepared using Systems analyst and may include unintentional dictation errors.  Neomia Glass DNP, MBA, FNP-BC, PMHNP-BC Nurse Practitioner [facility] Pager [phone]

## 2022-10-03 NOTE — TOC Initial Note (Signed)
Transition of Care [facility]) - Initial/Assessment Note    Patient Details  Name: Kristin [name] MRN: [id number] Date of Birth: Mar 31, 1941  Transition of Care [facility]) CM/SW Contact:    Beverly Sessions, RN Phone Number: 10/03/2022, 3:55 PM  Clinical Narrative:                  Notified by Lavella Lemons at [facility] that patient was admitted from [facility].  Notified MD that PT eval would be needed.  Covid positive 2/19. Full TOC assessment  to follow        Patient Goals and CMS Choice            Expected Discharge Plan and Services                                              Prior Living Arrangements/Services                       Activities of Daily Living      Permission Sought/Granted                  Emotional Assessment              Admission diagnosis:  Acute hypoxemic respiratory failure ([place]) [J96.01] COVID-19 virus infection [U07.1] Patient Active Problem List   Diagnosis Date Noted   Hypomagnesemia 10/03/2022   Acute hypoxemic respiratory failure ([place]) 10/02/2022   COVID-19 10/02/2022   Hypotension 10/02/2022   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[place]) 08/24/2022   Peripheral neuropathy 08/24/2022   Essential hypertension 08/24/2022   Hypokalemia 08/24/2022   Closed fracture of ramus of right pubis ([place]) 08/24/2022   Fall 08/24/2022   Pelvic ring fracture ([place]) 08/23/2022   Osteopenia 01/06/2022   CKD (chronic kidney disease) stage 3, GFR 30-59 ml/min ([place]) 05/18/2021   History of stroke 01/03/2021   Vascular dementia ([place]) 01/03/2021   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 ([place]) 08/27/2015   Osteoarthritis 08/10/2011   MYOCARDIAL INFARCTION, HX OF 08/01/2010   Fibromyalgia 05/20/2008   Hyperlipidemia 05/29/2007   OBSTRUCTIVE SLEEP APNEA 05/29/2007   Essential hypertension, benign 05/29/2007   PCP:  [name], NP Pharmacy:   [place], [place] [address] [place] [address] Phone: [phone] Fax: [phone]     Social Determinants of Health (SDOH) Social History: SDOH Screenings   Food Insecurity: No Food Insecurity (08/24/2022)  Housing: Low Risk  (08/24/2022)  Transportation Needs: No Transportation Needs (08/24/2022)  Utilities: Not At Risk (08/24/2022)  Alcohol Screen: Low Risk  (09/21/2021)  Depression (PHQ2-9): Low Risk  (01/11/2022)  Financial Resource Strain: Low Risk  (06/13/2021)  Physical Activity: Insufficiently Active (06/13/2021)  Social Connections: Moderately Isolated (06/13/2021)  Stress: No Stress Concern Present (01/11/2022)  Tobacco Use: Low Risk  (08/23/2022)   SDOH Interventions:     Readmission Risk Interventions     No data to display

## 2022-10-03 NOTE — Assessment & Plan Note (Signed)
-   Supplement magnesium 

## 2022-10-03 NOTE — Progress Notes (Signed)
PHARMACY - PHYSICIAN COMMUNICATION CRITICAL VALUE ALERT - BLOOD CULTURE IDENTIFICATION (BCID)  Kristin [name] is an 82 y.o. female who presented to [facility] on 10/02/2022 with a chief complaint of low blood pressure  Assessment:  blood culture from 2/19 with GPC in 1 of 4 bottles, BCID detects Staphylococcus species (NOT S aureus or epidermidis)  Name of physician (or Provider) Contacted: Dr Francine Graven  Current antibiotics: none  Changes to prescribed antibiotics recommended:  Monitor off antibiotics - likely contaminant  Results for orders placed or performed during the hospital encounter of 10/02/22  Blood Culture ID Panel (Reflexed) (Collected: 10/02/2022  2:30 PM)  Result Value Ref Range   Enterococcus faecalis NOT DETECTED NOT DETECTED   Enterococcus Faecium NOT DETECTED NOT DETECTED   Listeria monocytogenes NOT DETECTED NOT DETECTED   Staphylococcus species DETECTED (A) NOT DETECTED   Staphylococcus aureus (BCID) NOT DETECTED NOT DETECTED   Staphylococcus epidermidis NOT DETECTED NOT DETECTED   Staphylococcus lugdunensis NOT DETECTED NOT DETECTED   Streptococcus species NOT DETECTED NOT DETECTED   Streptococcus agalactiae NOT DETECTED NOT DETECTED   Streptococcus pneumoniae NOT DETECTED NOT DETECTED   Streptococcus pyogenes NOT DETECTED NOT DETECTED   A.calcoaceticus-baumannii NOT DETECTED NOT DETECTED   Bacteroides fragilis NOT DETECTED NOT DETECTED   Enterobacterales NOT DETECTED NOT DETECTED   Enterobacter cloacae complex NOT DETECTED NOT DETECTED   Escherichia coli NOT DETECTED NOT DETECTED   Klebsiella aerogenes NOT DETECTED NOT DETECTED   Klebsiella oxytoca NOT DETECTED NOT DETECTED   Klebsiella pneumoniae NOT DETECTED NOT DETECTED   Proteus species NOT DETECTED NOT DETECTED   Salmonella species NOT DETECTED NOT DETECTED   Serratia marcescens NOT DETECTED NOT DETECTED   Haemophilus influenzae NOT DETECTED NOT DETECTED   Neisseria meningitidis NOT DETECTED NOT  DETECTED   Pseudomonas aeruginosa NOT DETECTED NOT DETECTED   Stenotrophomonas maltophilia NOT DETECTED NOT DETECTED   Candida albicans NOT DETECTED NOT DETECTED   Candida auris NOT DETECTED NOT DETECTED   Candida glabrata NOT DETECTED NOT DETECTED   Candida krusei NOT DETECTED NOT DETECTED   Candida parapsilosis NOT DETECTED NOT DETECTED   Candida tropicalis NOT DETECTED NOT DETECTED   Cryptococcus neoformans/gattii NOT DETECTED NOT DETECTED    Doreene Eland, PharmD, BCPS, BCIDP Work Cell: [phone] 10/03/2022 12:33 PM

## 2022-10-03 NOTE — Progress Notes (Signed)
Progress Note   Patient: Kristin [name] [id number] DOB: Apr 23, 1941 DOA: 10/02/2022     1 DOS: the patient was seen and examined on 10/03/2022   Brief hospital course: : [name], hypertension, peripheral neuropathy, arteriosclerotic dementia with depression, [name], [name], [name], who presents to the emergency department for chief concerns of hypotension and loss of consciousness. Patient tested positive for COVID-19. UA was negative for leukocytes and nitrates. Was said to be hypoxic with room air pulse oximetry of 88% and is currently on 2 L of oxygen.   02/19: Patient is seen and examined at the bedside.  She has no new complaints.  Assessment and Plan: * Acute hypoxemic respiratory failure ([place]) Per ED report, patient dropped her pulse oximetry to 88% on room air and patient did not have baseline O2 supplementation requirements - Presumed secondary to COVID-19 Will attempt to wean off oxygen once acute illness improves or resolves  Hypotension - With reported passing out at facility - Presumed secondary to poor p.o. intake and continued antihypertensive medication use in setting of polypharmacy: Patient is prescribed oxycodone-acetaminophen 5-325 mg, trazodone 50 mg nightly, gabapentin 400 mg p.o. twice daily Improved blood pressure control with IV fluid hydration  COVID-19 - Airborne and contact precautions Supportive care  Hypokalemia Supplement potassium Check magnesium level  Essential hypertension Will resume diltiazem but will continue to hold clonidine  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[facility]) Continue donepezil 10 mg nightly, duloxetine 30 mg p.o. twice daily - Trazodone 25 mg nightly as needed for sleep  Hypomagnesemia Supplement magnesium          Physical Exam: Vitals:   10/02/22 2338 10/03/22 0107 10/03/22 0320 10/03/22 0754  BP: (!) 167/53 (!) 171/61 (!) 136/54 (!) 155/67   Pulse: 80 75 70 81  Resp: 20 20 20 20  $ Temp: 97.6 F (36.4 C) 98.2 F (36.8 C) 98.2 F (36.8 C) 98.1 F (36.7 C)  TempSrc: Oral Oral    SpO2: 96% 100% 100% 100%  Weight:  60.8 kg    Height:  5' 7"$  (1.702 m)     Physical Exam Vitals and nursing note reviewed.  Constitutional:      Comments: Oriented only to person  HENT:     Head: Normocephalic and atraumatic.     Nose: Nose normal.     Mouth/Throat:     Mouth: Mucous membranes are moist.  Eyes:     Conjunctiva/sclera: Conjunctivae normal.  Cardiovascular:     Rate and Rhythm: Normal rate and regular rhythm.  Pulmonary:     Effort: Pulmonary effort is normal.     Breath sounds: Normal breath sounds.  Abdominal:     General: Abdomen is flat. Bowel sounds are normal.     Palpations: Abdomen is soft.  Musculoskeletal:     Cervical back: Normal range of motion and neck supple.     Comments: Bilateral heel protectors  Skin:    General: Skin is warm and dry.  Neurological:     Mental Status: She is alert.     Comments: Oriented only to person     Data Reviewed: Labs reviewed.  Magnesium 1.6, sodium 141, potassium 2.5, chloride 104, bicarb 26, glucose 89, BUN 8, creatinine 0.78, calcium 8.3, white count 5.4, hemoglobin 9.6, hematocrit 29.4, platelet count 260 There are no new results to review at this time.  Family Communication: None  Disposition: Status is: Inpatient Remains inpatient appropriate because: Treatment of  electrolyte abnormalities  Planned Discharge Destination: Skilled nursing facility    Time spent: 33 minutes  Author: Collier Bullock, MD 10/03/2022 3:44 PM  For on call review [email or web address].

## 2022-10-04 DIAGNOSIS — J9601 Acute respiratory failure with hypoxia: Secondary | ICD-10-CM | POA: Diagnosis not present

## 2022-10-04 LAB — CBC WITH DIFFERENTIAL/PLATELET
Abs Immature Granulocytes: 0.01 10*3/uL (ref 0.00–0.07)
Basophils Absolute: 0 10*3/uL (ref 0.0–0.1)
Basophils Relative: 1 %
Eosinophils Absolute: 0.2 10*3/uL (ref 0.0–0.5)
Eosinophils Relative: 3 %
HCT: 35.1 % — ABNORMAL LOW (ref 36.0–46.0)
Hemoglobin: 11.7 g/dL — ABNORMAL LOW (ref 12.0–15.0)
Immature Granulocytes: 0 %
Lymphocytes Relative: 18 %
Lymphs Abs: 1 10*3/uL (ref 0.7–4.0)
MCH: 31 pg (ref 26.0–34.0)
MCHC: 33.3 g/dL (ref 30.0–36.0)
MCV: 93.1 fL (ref 80.0–100.0)
Monocytes Absolute: 0.6 10*3/uL (ref 0.1–1.0)
Monocytes Relative: 10 %
Neutro Abs: 4 10*3/uL (ref 1.7–7.7)
Neutrophils Relative %: 68 %
Platelets: 298 10*3/uL (ref 150–400)
RBC: 3.77 MIL/uL — ABNORMAL LOW (ref 3.87–5.11)
RDW: 14.9 % (ref 11.5–15.5)
WBC: 5.8 10*3/uL (ref 4.0–10.5)
nRBC: 0 % (ref 0.0–0.2)

## 2022-10-04 LAB — BASIC METABOLIC PANEL
Anion gap: 7 (ref 5–15)
BUN: <5 mg/dL — ABNORMAL LOW (ref 8–23)
CO2: 26 mmol/L (ref 22–32)
Calcium: 8.5 mg/dL — ABNORMAL LOW (ref 8.9–10.3)
Chloride: 102 mmol/L (ref 98–111)
Creatinine, Ser: 0.66 mg/dL (ref 0.44–1.00)
GFR, Estimated: >60 mL/min (ref >60)
Glucose, Bld: 102 mg/dL — ABNORMAL HIGH (ref 70–99)
Potassium: 3 mmol/L — ABNORMAL LOW (ref 3.5–5.1)
Sodium: 135 mmol/L (ref 135–145)

## 2022-10-04 LAB — MAGNESIUM: Magnesium: 2 mg/dL (ref 1.7–2.4)

## 2022-10-04 MED ORDER — FERROUS SULFATE 325 (65 FE) MG PO TABS
325.0000 mg | ORAL_TABLET | Freq: Every day | ORAL | Status: DC
  Administered 2022-10-06 – 2022-10-09 (×4): 325 mg via ORAL
  Filled 2022-10-04 (×4): qty 1

## 2022-10-04 MED ORDER — OXYCODONE HCL 5 MG PO TABS
5.0000 mg | ORAL_TABLET | Freq: Four times a day (QID) | ORAL | Status: DC | PRN
  Administered 2022-10-08: 5 mg via ORAL
  Filled 2022-10-04: qty 1

## 2022-10-04 MED ORDER — ASPIRIN 81 MG PO CHEW
81.0000 mg | CHEWABLE_TABLET | Freq: Every day | ORAL | Status: DC
  Administered 2022-10-04 – 2022-10-09 (×5): 81 mg via ORAL
  Filled 2022-10-04 (×6): qty 1

## 2022-10-04 MED ORDER — CLONIDINE HCL 0.1 MG PO TABS
0.2000 mg | ORAL_TABLET | Freq: Two times a day (BID) | ORAL | Status: DC
  Administered 2022-10-04 – 2022-10-09 (×10): 0.2 mg via ORAL
  Filled 2022-10-04 (×11): qty 2

## 2022-10-04 MED ORDER — TAMSULOSIN HCL 0.4 MG PO CAPS
0.4000 mg | ORAL_CAPSULE | Freq: Every day | ORAL | Status: DC
  Administered 2022-10-04 – 2022-10-08 (×5): 0.4 mg via ORAL
  Filled 2022-10-04 (×5): qty 1

## 2022-10-04 MED ORDER — POTASSIUM CHLORIDE 20 MEQ PO PACK
40.0000 meq | PACK | Freq: Once | ORAL | Status: AC
  Administered 2022-10-04: 40 meq via ORAL
  Filled 2022-10-04: qty 2

## 2022-10-04 MED ORDER — ENSURE ENLIVE PO LIQD
237.0000 mL | Freq: Three times a day (TID) | ORAL | Status: DC
  Administered 2022-10-04 – 2022-10-09 (×9): 237 mL via ORAL

## 2022-10-04 NOTE — Plan of Care (Signed)

## 2022-10-04 NOTE — Progress Notes (Signed)
PROGRESS NOTE    ALFIE SVETLIK  [id number] DOB: 1941/08/02 DOA: 10/02/2022 PCP: [name], NP    Brief Narrative:  Ms. Deniyah Patchin is an 82 year old female with history of fibromyalgia, hypertension, peripheral neuropathy, arteriosclerotic dementia with depression, [name], [name], [name], who presents to the emergency department for chief concerns of hypotension and loss of consciousness. Patient tested positive for COVID-19. UA was negative for leukocytes and nitrates. Was said to be hypoxic with room air pulse oximetry of 88% and is currently on 2 L of oxygen.     02/19: Patient is seen and examined at the bedside.  She has no new complaints. 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  Patient on room air as of 2/21   Hypotension - With reported passing out at facility - Presumed secondary to poor p.o. intake and continued antihypertensive medication use in setting of polypharmacy: Patient is prescribed oxycodone-acetaminophen 5-325 mg, trazodone 50 mg nightly, gabapentin 400 mg p.o. twice daily Improved blood pressure control with IV fluid hydration   COVID-19 - Airborne and contact precautions Supportive care   Hypokalemia Supplement potassium   Essential hypertension Continue diltiazem.  For now dose has been adjusted to immediate release form as patient was found to be biting on her pill.  Restarted clonidine  per home dose.   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[place]) Continue donepezil 10 mg nightly, duloxetine 30 mg p.o. twice daily - Trazodone 25 mg nightly as needed for sleep   Hypomagnesemia Supplement magnesium  DVT prophylaxis: SQ Lovenox Code Status: DNR Family Communication: Leonides Grills [phone] on 2/21 Disposition Plan: Status is: Inpatient Remains inpatient appropriate because: Resolving respiratory failure.  Anticipate medical readiness for discharge 2/22   Level of care: Telemetry Medical  Consultants:  None Procedures:  None  Antimicrobials: None   Subjective: Seen and examined.  Appears somewhat lethargic.  Appears fatigued  Objective: Vitals:   10/03/22 1557 10/03/22 1938 10/04/22 0621 10/04/22 0824  BP: (!) 156/66 (!) 151/71 (!) 168/71 (!) 164/66  Pulse: 91 93 81 75  Resp: (!) 22 20 20 18  $ Temp: 98.1 F (36.7 C) 98.3 F (36.8 C) 98.1 F (36.7 C) 98.2 F (36.8 C)  TempSrc:  Oral Oral Oral  SpO2: 98% 100% 98% 100%  Weight:      Height:        Intake/Output Summary (Last 24 hours) at 10/04/2022 1309 Last data filed at 10/04/2022 1035 Gross per 24 hour  Intake 60 ml  Output 3350 ml  Net -3290 ml   Filed Weights   10/02/22 1759 10/03/22 0107  Weight: 71.4 kg 60.8 kg    Examination:  General exam: NAD.  Frail-appearing.  Lethargic.  Frail Respiratory system: Bibasilar crackles.  Normal work of breathing.  Room air Cardiovascular system: S1-S2, RRR, no murmurs, no pedal edema Gastrointestinal system: Soft, NT/ND, normal bowel sounds Central nervous system: Alert and oriented x 1, no focal deficits Extremities: Symmetric 5 x 5 power. Skin:  No rashes, lesions or ulcers Psychiatry: Judgement and insight appear impaired. Mood & affect flattened.     Data Reviewed: I have personally reviewed following labs and imaging studies  CBC: Recent Labs  Lab 10/02/22 1430 10/03/22 0313 10/04/22 0745  WBC 6.6 5.4 5.8  NEUTROABS 5.1  --   4.0  HGB 9.8* 9.6* 11.7*  HCT 30.3* 29.4* 35.1*  MCV 98.1 96.1 93.1  PLT 247 260 [id number]   Basic Metabolic Panel: Recent Labs  Lab 10/02/22 1430 10/03/22 0313 10/03/22 1549 10/04/22 0745  NA 138 141 137 135  K 3.3* 2.5* 3.4* 3.0*  CL 106 104 102 102  CO2 22 26 25 26  $ GLUCOSE 119* 89 91 102*  BUN 14 8 <5* <5*  CREATININE 1.15* 0.78 0.62 0.66  CALCIUM 8.6* 8.3* 8.5* 8.5*  MG 2.0 1.6*  --  2.0   GFR: Estimated Creatinine Clearance: 52 mL/min (by C-G formula based on SCr of 0.66 mg/dL). Liver Function Tests: Recent Labs  Lab 10/02/22 1430  AST 42*  ALT 13  ALKPHOS 133*  BILITOT 0.9  PROT 6.4*  ALBUMIN 2.9*   No results for input(s): "LIPASE", "AMYLASE" in the last 168 hours. No results for input(s): "AMMONIA" in the last 168 hours. Coagulation Profile: No results for input(s): "INR", "PROTIME" in the last 168 hours. Cardiac Enzymes: No results for input(s): "CKTOTAL", "CKMB", "CKMBINDEX", "TROPONINI" in the last 168 hours. BNP (last 3 results) No results for input(s): "PROBNP" in the last 8760 hours. HbA1C: No results for input(s): "HGBA1C" in the last 72 hours. CBG: No results for input(s): "GLUCAP" in the last 168 hours. Lipid Profile: No results for input(s): "CHOL", "HDL", "LDLCALC", "TRIG", "CHOLHDL", "LDLDIRECT" in the last 72 hours. Thyroid Function Tests: No results for input(s): "TSH", "T4TOTAL", "FREET4", "T3FREE", "THYROIDAB" in the last 72 hours. Anemia Panel: No results for input(s): "VITAMINB12", "FOLATE", "FERRITIN", "TIBC", "IRON", "RETICCTPCT" in the last 72 hours. Sepsis Labs: Recent Labs  Lab 10/02/22 1430  PROCALCITON <0.10    Recent Results (from the past 240 hour(s))  Resp panel by RT-PCR (RSV, Flu A&B, Covid) Anterior Nasal Swab     Status: Abnormal   Collection Time: 10/02/22  2:30 PM   Specimen: Anterior Nasal Swab  Result Value Ref Range Status   SARS Coronavirus 2 by RT PCR POSITIVE (A) NEGATIVE Final    Comment: (NOTE) SARS-CoV-2  target nucleic acids are DETECTED.  The SARS-CoV-2 RNA is generally detectable in upper respiratory specimens during the acute phase of infection. Positive results are indicative of the presence of the identified virus, but do not rule out bacterial infection or co-infection with other pathogens not detected by the test. Clinical correlation with patient history and other diagnostic information is necessary to determine patient infection status. The expected result is Negative.  Fact Sheet for Patients: EntrepreneurPulse.com.au  Fact Sheet for Healthcare Providers: IncredibleEmployment.be  This test is not yet approved or cleared by the [place] FDA and  has been authorized for detection and/or diagnosis of SARS-CoV-2 by FDA under an Emergency Use Authorization (EUA).  This EUA will remain in effect (meaning this test can be used) for the duration of  the COVID-19 declaration under Section 564(b)(1) of the A ct, 21 U.S.C. section 360bbb-3(b)(1), unless the authorization is terminated or revoked sooner.     Influenza A by PCR NEGATIVE NEGATIVE Final   Influenza B by PCR NEGATIVE NEGATIVE Final    Comment: (NOTE) The Xpert Xpress SARS-CoV-2/FLU/RSV plus assay is intended as an  aid in the diagnosis of influenza from Nasopharyngeal swab specimens and should not be used as a sole basis for treatment. Nasal washings and aspirates are unacceptable for Xpert Xpress SARS-CoV-2/FLU/RSV testing.  Fact Sheet for Patients: EntrepreneurPulse.com.au  Fact Sheet for Healthcare Providers: IncredibleEmployment.be  This test is not yet approved or cleared by the [place] FDA and has been authorized for detection and/or diagnosis of SARS-CoV-2 by FDA under an Emergency Use Authorization (EUA). This EUA will remain in effect (meaning this test can be used) for the duration of the COVID-19 declaration under Section  564(b)(1) of the Act, 21 U.S.C. section 360bbb-3(b)(1), unless the authorization is terminated or revoked.     Resp Syncytial Virus by PCR NEGATIVE NEGATIVE Final    Comment: (NOTE) Fact Sheet for Patients: EntrepreneurPulse.com.au  Fact Sheet for Healthcare Providers: IncredibleEmployment.be  This test is not yet approved or cleared by the [place] FDA and has been authorized for detection and/or diagnosis of SARS-CoV-2 by FDA under an Emergency Use Authorization (EUA). This EUA will remain in effect (meaning this test can be used) for the duration of the COVID-19 declaration under Section 564(b)(1) of the Act, 21 U.S.C. section 360bbb-3(b)(1), unless the authorization is terminated or revoked.  Performed at [facility], [address] [place], [place] [address]   Culture, blood (routine x 2)     Status: None (Preliminary result)   Collection Time: 10/02/22  2:30 PM   Specimen: BLOOD  Result Value Ref Range Status   Specimen Description BLOOD BLOOD RIGHT WRIST  Final   Special Requests   Final    BOTTLES DRAWN AEROBIC AND ANAEROBIC Blood Culture adequate volume   Culture   Final    NO GROWTH < 24 HOURS Performed at [facility], [address] [place], [place] [address]    Report Status PENDING  Incomplete  Culture, blood (routine x 2)     Status: Abnormal (Preliminary result)   Collection Time: 10/02/22  2:30 PM   Specimen: BLOOD LEFT HAND  Result Value Ref Range Status   Specimen Description   Final    BLOOD LEFT HAND Performed at [facility], [place] [address], [place], [place] [address]    Special Requests   Final    BOTTLES DRAWN AEROBIC AND ANAEROBIC Blood Culture adequate volume Performed at [facility], [address] [place], [place] [address]    Culture  Setup Time   Final    GRAM POSITIVE COCCI ANAEROBIC BOTTLE ONLY CRITICAL RESULT CALLED TO, READ BACK BY AND VERIFIED WITH:  CARISSA [facility] 10/03/22 1222 MW    Culture (A)  Final    STAPHYLOCOCCUS CAPITIS THE SIGNIFICANCE OF ISOLATING THIS ORGANISM FROM A SINGLE SET OF BLOOD CULTURES WHEN MULTIPLE SETS ARE DRAWN IS UNCERTAIN. PLEASE NOTIFY THE MICROBIOLOGY DEPARTMENT WITHIN ONE WEEK IF SPECIATION AND SENSITIVITIES ARE REQUIRED. Performed at [facility], [place] [address] [place], [place] [address]    Report Status PENDING  Incomplete  Blood Culture ID Panel (Reflexed)     Status: Abnormal   Collection Time: 10/02/22  2:30 PM  Result Value Ref Range Status   Enterococcus faecalis NOT DETECTED NOT DETECTED Final   Enterococcus Faecium NOT DETECTED NOT DETECTED Final   Listeria monocytogenes NOT DETECTED NOT DETECTED Final   Staphylococcus species DETECTED (A) NOT DETECTED Final    Comment: CRITICAL RESULT CALLED TO, READ BACK BY AND VERIFIED WITH: CARISSA [facility] 10/03/22 1222 MW    Staphylococcus aureus (  BCID) NOT DETECTED NOT DETECTED Final   Staphylococcus epidermidis NOT DETECTED NOT DETECTED Final   Staphylococcus lugdunensis NOT DETECTED NOT DETECTED Final   Streptococcus species NOT DETECTED NOT DETECTED Final   Streptococcus agalactiae NOT DETECTED NOT DETECTED Final   Streptococcus pneumoniae NOT DETECTED NOT DETECTED Final   Streptococcus pyogenes NOT DETECTED NOT DETECTED Final   A.calcoaceticus-baumannii NOT DETECTED NOT DETECTED Final   Bacteroides fragilis NOT DETECTED NOT DETECTED Final   Enterobacterales NOT DETECTED NOT DETECTED Final   Enterobacter cloacae complex NOT DETECTED NOT DETECTED Final   Escherichia coli NOT DETECTED NOT DETECTED Final   Klebsiella aerogenes NOT DETECTED NOT DETECTED Final   Klebsiella oxytoca NOT DETECTED NOT DETECTED Final   Klebsiella pneumoniae NOT DETECTED NOT DETECTED Final   Proteus species NOT DETECTED NOT DETECTED Final   Salmonella species NOT DETECTED NOT DETECTED Final   Serratia marcescens NOT DETECTED NOT DETECTED Final   Haemophilus influenzae  NOT DETECTED NOT DETECTED Final   Neisseria meningitidis NOT DETECTED NOT DETECTED Final   Pseudomonas aeruginosa NOT DETECTED NOT DETECTED Final   Stenotrophomonas maltophilia NOT DETECTED NOT DETECTED Final   Candida albicans NOT DETECTED NOT DETECTED Final   Candida auris NOT DETECTED NOT DETECTED Final   Candida glabrata NOT DETECTED NOT DETECTED Final   Candida krusei NOT DETECTED NOT DETECTED Final   Candida parapsilosis NOT DETECTED NOT DETECTED Final   Candida tropicalis NOT DETECTED NOT DETECTED Final   Cryptococcus neoformans/gattii NOT DETECTED NOT DETECTED Final    Comment: Performed at [facility], [address] [place], [place] [address]  MRSA Next Gen by PCR, Nasal     Status: None   Collection Time: 10/03/22  1:55 AM   Specimen: Nasal Mucosa; Nasal Swab  Result Value Ref Range Status   MRSA by PCR Next Gen NOT DETECTED NOT DETECTED Final    Comment: (NOTE) The GeneXpert MRSA Assay (FDA approved for NASAL specimens only), is one component of a comprehensive MRSA colonization surveillance program. It is not intended to diagnose MRSA infection nor to guide or monitor treatment for MRSA infections. Test performance is not FDA approved in patients less than 18 years old. Performed at [facility], [address] [place], [place] [address]          Radiology Studies: CT Angio Chest PE W/Cm &/Or Wo Cm  Result Date: 10/02/2022 CLINICAL DATA:  COVID hypotensive EXAM: CT ANGIOGRAPHY CHEST WITH CONTRAST TECHNIQUE: Multidetector CT imaging of the chest was performed using the standard protocol during bolus administration of intravenous contrast. Multiplanar CT image reconstructions and MIPs were obtained to evaluate the vascular anatomy. RADIATION DOSE REDUCTION: This exam was performed according to the departmental dose-optimization program which includes automated exposure control, adjustment of the mA and/or kV according to patient size and/or use of  iterative reconstruction technique. CONTRAST:  54m OMNIPAQUE IOHEXOL 350 MG/ML SOLN COMPARISON:  Chest x-ray 10/02/2022 FINDINGS: Cardiovascular: Satisfactory opacification of the pulmonary arteries to the segmental level. No evidence of pulmonary embolism. Normal heart size. No significant pericardial effusion. Nonaneurysmal aorta. No dissection is seen. Moderate atherosclerosis. Coronary vascular calcification. Mediastinum/Nodes: Midline trachea. No thyroid mass. No suspicious lymph nodes. Esophagus within normal limits. Lungs/Pleura: Trace pleural effusions and mild dependent atelectasis. No acute airspace disease. Upper Abdomen: No acute finding. Multiple hepatic cysts with dominant 12.6 by 10.1 by 11.2 cm right hepatic cyst. Indeterminate hypodense mass at the left hepatic lobe inferior to the falciform ligament region, this measures 2.4 by 2.1  cm. Some peripheral calcification. Musculoskeletal: No chest wall abnormality. No acute or significant osseous findings. Review of the MIP images confirms the above findings. IMPRESSION: 1. Negative for acute pulmonary embolus or aortic dissection. Trace pleural effusions with mild dependent atelectasis. No acute airspace disease. 2. Indeterminate 2.4 cm hypodense mass in the left hepatic lobe. When the patient is clinically stable and able to follow directions and hold their breath (preferably as an outpatient) further evaluation with dedicated abdominal MRI should be considered. Aortic Atherosclerosis (ICD10-I70.0). Electronically Signed   By: Donavan Foil M.D.   On: 10/02/2022 17:21   DG Chest 1 View  Result Date: 10/02/2022 CLINICAL DATA:  Shortness of breath in a female at age 65. EXAM: CHEST  1 VIEW COMPARISON:  Comparison made with August 23, 2022 FINDINGS: Cardiomediastinal contours and hilar structures are stable with mild cardiac enlargement and central pulmonary vascular engorgement suggested. No lobar level consolidation. Mild elevation of the RIGHT  hemidiaphragm. Mild increase in retrocardiac airspace disease mainly with linear appearance. Mild blunting of LEFT costodiaphragmatic sulcus. No pneumothorax. On limited assessment no acute skeletal process. IMPRESSION: 1. Mild cardiac enlargement and central pulmonary vascular engorgement. 2. Mild increase in retrocardiac airspace disease mainly with linear appearance, potentially atelectasis though possibly so seated with small LEFT-sided pleural effusion. Seen in the LEFT lung base and LEFT costodiaphragmatic sulcus. Electronically Signed   By: Zetta Bills M.D.   On: 10/02/2022 13:21        Scheduled Meds:  aspirin  81 mg Oral Daily   Chlorhexidine Gluconate Cloth  6 each Topical Q0600   cloNIDine  0.2 mg Oral Q12H   diltiazem  60 mg Oral Q6H   donepezil  10 mg Oral QHS   DULoxetine  30 mg Oral BID   enoxaparin (LOVENOX) injection  40 mg Subcutaneous Q24H   feeding supplement  237 mL Oral BID BM   [START ON 10/05/2022] ferrous sulfate  325 mg Oral Q breakfast   gabapentin  400 mg Oral BID   multivitamin with minerals  1 tablet Oral Daily   tamsulosin  0.4 mg Oral QHS   Continuous Infusions:   LOS: 2 days      Sidney Ace, MD [facility]   If 7PM-7AM, please contact night-coverage  10/04/2022, 1:09 PM

## 2022-10-04 NOTE — TOC Progression Note (Signed)
Transition of Care [facility]) - Progression Note    Patient Details  Name: Kristin [name] MRN: [id number] Date of Birth: April 30, 1941  Transition of Care [facility]) CM/SW Contact  Beverly Sessions, RN Phone Number: 10/04/2022, 12:56 PM  Clinical Narrative:    Spoke with husband Mr Islas. He confirms plan for patient to return to [facility] PT eval pending Patient will need FL2 and insurance auth        Expected Discharge Plan and Services                                               Social Determinants of Health (SDOH) Interventions SDOH Screenings   Food Insecurity: No Food Insecurity (08/24/2022)  Housing: Low Risk  (08/24/2022)  Transportation Needs: No Transportation Needs (08/24/2022)  Utilities: Not At Risk (08/24/2022)  Alcohol Screen: Low Risk  (09/21/2021)  Depression (PHQ2-9): Low Risk  (01/11/2022)  Financial Resource Strain: Low Risk  (06/13/2021)  Physical Activity: Insufficiently Active (06/13/2021)  Social Connections: Moderately Isolated (06/13/2021)  Stress: No Stress Concern Present (01/11/2022)  Tobacco Use: Low Risk  (08/23/2022)    Readmission Risk Interventions     No data to display

## 2022-10-04 NOTE — Progress Notes (Signed)
Initial Nutrition Assessment  DOCUMENTATION CODES:   Not applicable  INTERVENTION:   Ensure Enlive po TID, each supplement provides 350 kcal and 20 grams of protein.  Magic cup TID with meals, each supplement provides 290 kcal and 9 grams of protein  MVI po daily   Liberalize diet   Pt at high refeed risk; recommend monitor potassium, magnesium and phosphorus labs daily until stable  NUTRITION DIAGNOSIS:   Increased nutrient needs related to catabolic illness as evidenced by estimated needs.  GOAL:   Patient will meet greater than or equal to 90% of their needs  MONITOR:   PO intake, Supplement acceptance, Labs, Weight trends, I & O's, Skin  REASON FOR ASSESSMENT:   Consult Assessment of nutrition requirement/status  ASSESSMENT:   82 y/o female with h/o [name], fibromyalgia, dementia, DVT, HTN,, OSA, MI, CKD III, stroke, RA and [name] who is admitted with COVID 19.  Met with pt in room today. Pt is a poor historian. Pt reports that she has not had much of an appetite for the past couple of days but reports that she eats well at baseline. Pt's lunch tray is on her side table with only bites taken from it. Pt reports that she likes vanilla or chocolate Ensure. Pt did drink 50% of an Ensure for lunch today. RD encouraged pt to try and drink at least two supplements per day. Pt denies any issues with chewing or swallowing. RD will add Magic Cups to meal trays as pt reports that she loves ice cream. RD will liberalize pt's diet. Pt is at refeed risk. Per chart, pt appears weight stable at baseline.   Medications reviewed and include: aspirin, lovenox, ferrous, MVI  Labs reviewed: K 3.0(L), BUN <5(L), Mg 2.0 wnl  NUTRITION - FOCUSED PHYSICAL EXAM:  Flowsheet Row Most Recent Value  Orbital Region No depletion  Upper Arm Region No depletion  Thoracic and Lumbar Region Mild depletion  Buccal Region No depletion  Temple Region Mild depletion  Clavicle Bone Region Moderate  depletion  Clavicle and Acromion Bone Region Moderate depletion  Scapular Bone Region Mild depletion  Dorsal Hand Severe depletion  Patellar Region No depletion  Anterior Thigh Region Mild depletion  Posterior Calf Region Mild depletion  Edema (RD Assessment) None  Hair Reviewed  Eyes Reviewed  Mouth Reviewed  Skin Reviewed  Nails Reviewed   Diet Order:   Diet Order             Diet regular Room service appropriate? Yes; Fluid consistency: Thin  Diet effective now                  EDUCATION NEEDS:   Education needs have been addressed  Skin:  Skin Assessment: Reviewed RN Assessment (pressure injuries L and R heels)  Last BM:  2/21- type 6  Height:   Ht Readings from Last 1 Encounters:  10/03/22 5' 7"$  (1.702 m)    Weight:   Wt Readings from Last 1 Encounters:  10/04/22 59.6 kg    Ideal Body Weight:  61.36 kg  BMI:  Body mass index is 20.6 kg/m.  Estimated Nutritional Needs:   Kcal:  1500-1700kcal/day  Protein:  70-80g/day  Fluid:  1.5-1.7L/day  Koleen Distance MS, RD, LDN Please refer to [facility] for RD and/or RD on-call/weekend/after hours pager

## 2022-10-04 NOTE — TOC Progression Note (Addendum)
Transition of Care [facility]) - Progression Note    Patient Details  Name: Kristin [name] MRN: [id number] Date of Birth: 05/31/1941  Transition of Care [facility]) CM/SW Contact  Beverly Sessions, RN Phone Number: 10/04/2022, 4:30 PM  Clinical Narrative:     Josem Kaufmann started via phone with Colletta [place] from [facility]        Expected Discharge Plan and Services                                               Social Determinants of Health (SDOH) Interventions SDOH Screenings   Food Insecurity: No Food Insecurity (08/24/2022)  Housing: Low Risk  (08/24/2022)  Transportation Needs: No Transportation Needs (08/24/2022)  Utilities: Not At Risk (08/24/2022)  Alcohol Screen: Low Risk  (09/21/2021)  Depression (PHQ2-9): Low Risk  (01/11/2022)  Financial Resource Strain: Low Risk  (06/13/2021)  Physical Activity: Insufficiently Active (06/13/2021)  Social Connections: Moderately Isolated (06/13/2021)  Stress: No Stress Concern Present (01/11/2022)  Tobacco Use: Low Risk  (08/23/2022)    Readmission Risk Interventions     No data to display

## 2022-10-04 NOTE — NC FL2 (Signed)
[place] LEVEL OF CARE FORM     IDENTIFICATION  Patient Name: Kristin [name] Birthdate: 07-17-1941 Sex: female Admission Date (Current Location): 10/02/2022  [facility] and [place] Number:  Engineering geologist and Address:         Provider Number: [id number]  Attending Physician Name and Address:  Sidney Ace, MD  Relative Name and Phone Number:       Current Level of Care: Hospital Recommended Level of Care: [place] Prior Approval Number:    Date Approved/Denied:   PASRR Number: [id number]  Discharge Plan: SNF    Current Diagnoses: Patient Active Problem List   Diagnosis Date Noted   Hypomagnesemia 10/03/2022   Acute hypoxemic respiratory failure ([place]) 10/02/2022   COVID-19 10/02/2022   Hypotension 10/02/2022   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[place]) 08/24/2022   Peripheral neuropathy 08/24/2022   Essential hypertension 08/24/2022   Hypokalemia 08/24/2022   Closed fracture of ramus of right pubis ([place]) 08/24/2022   Fall 08/24/2022   Pelvic ring fracture ([place]) 08/23/2022   Osteopenia 01/06/2022   CKD (chronic kidney disease) stage 3, GFR 30-59 ml/min ([place]) 05/18/2021   History of stroke 01/03/2021   Vascular dementia ([place]) 01/03/2021   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 ([place]) 08/27/2015   Osteoarthritis 08/10/2011   MYOCARDIAL INFARCTION, HX OF 08/01/2010   Fibromyalgia 05/20/2008   Hyperlipidemia 05/29/2007   OBSTRUCTIVE SLEEP APNEA 05/29/2007   Essential hypertension, benign 05/29/2007    Orientation RESPIRATION BLADDER Height & Weight     Self  Normal Indwelling catheter Weight: 59.6 kg Height:  5' 7"$  (170.2 cm)  BEHAVIORAL SYMPTOMS/MOOD NEUROLOGICAL BOWEL NUTRITION STATUS      Continent Diet (regular)  AMBULATORY STATUS COMMUNICATION OF NEEDS Skin     Verbally PU Stage and Appropriate Care (unstagable)                        Personal Care Assistance Level of Assistance  Total care           Functional Limitations Info             SPECIAL CARE FACTORS FREQUENCY  PT (By licensed PT), OT (By licensed OT)                    Contractures Contractures Info: Not present    Additional Factors Info  Code Status, Allergies Code Status Info: DNR Allergies Info: Angiotensin Receptor Blockers, Other, Hydralazine, Latex, Losartan, Methotrexate Derivatives, Spironolactone, Penicillins           Current Medications (10/04/2022):  This is the current hospital active medication list Current Facility-Administered Medications  Medication Dose Route Frequency Provider Last Rate Last Admin   acetaminophen (TYLENOL) tablet 650 mg  650 mg Oral Q6H PRN Cox, Amy N, DO       Or   acetaminophen (TYLENOL) suppository 650 mg  650 mg Rectal Q6H PRN Cox, Amy N, DO       aspirin chewable tablet 81 mg  81 mg Oral Daily Priscella Mann, Sudheer B, MD   81 mg at 10/04/22 [id number]   Chlorhexidine Gluconate Cloth 2 % PADS 6 each  6 each Topical Q0600 Cox, Amy N, DO   6 each at 10/04/22 1001   cloNIDine (CATAPRES) tablet 0.2 mg  0.2 mg Oral Q12H Sreenath, Sudheer B, MD   0.2 mg at 10/04/22 0952   diltiazem (CARDIZEM) tablet 60 mg  60  mg Oral Q6H Agbata, Tochukwu, MD   60 mg at 10/04/22 1229   donepezil (ARICEPT) tablet 10 mg  10 mg Oral QHS Cox, Amy N, DO   10 mg at 10/03/22 2301   DULoxetine (CYMBALTA) DR capsule 30 mg  30 mg Oral BID Cox, Amy N, DO   30 mg at 10/04/22 [id number]   enoxaparin (LOVENOX) injection 40 mg  40 mg Subcutaneous Q24H Cox, Amy N, DO   40 mg at 10/03/22 2301   feeding supplement (ENSURE ENLIVE / ENSURE PLUS) liquid 237 mL  237 mL Oral TID BM Sreenath, Sudheer B, MD   237 mL at 10/04/22 1408   [START ON 10/05/2022] ferrous sulfate tablet 325 mg  325 mg Oral Q breakfast Sreenath, Sudheer B, MD       gabapentin (NEURONTIN) capsule 400 mg  400 mg Oral BID Cox, Amy N, DO   400 mg at 10/04/22 [id number]   multivitamin with  minerals tablet 1 tablet  1 tablet Oral Daily Cox, Amy N, DO   1 tablet at 10/04/22 0952   ondansetron (ZOFRAN) tablet 4 mg  4 mg Oral Q6H PRN Cox, Amy N, DO       Or   ondansetron (ZOFRAN) injection 4 mg  4 mg Intravenous Q6H PRN Cox, Amy N, DO       Oral care mouth rinse  15 mL Mouth Rinse PRN Cox, Amy N, DO       oxyCODONE (Oxy IR/ROXICODONE) immediate release tablet 5 mg  5 mg Oral Q6H PRN Sreenath, Sudheer B, MD       senna-docusate (Senokot-S) tablet 1 tablet  1 tablet Oral QHS PRN Cox, Amy N, DO   1 tablet at 10/03/22 1854   tamsulosin (FLOMAX) capsule 0.4 mg  0.4 mg Oral QHS Sreenath, Sudheer B, MD       traZODone (DESYREL) tablet 25 mg  25 mg Oral QHS PRN Cox, Amy N, DO         Discharge Medications: Please see discharge summary for a list of discharge medications.  Relevant Imaging Results:  Relevant Lab Results:   Additional Information SS# [id number]  Beverly Sessions, RN

## 2022-10-04 NOTE — Evaluation (Signed)
Physical Therapy Evaluation Patient Details Name: Kristin [name] MRN: [id number] DOB: [birth year] Today's Date: 10/04/2022  History of Present Illness  [name], hypertension, peripheral neuropathy, arteriosclerotic dementia with depression, [name], [name], [name], who presents to the emergency department for chief concerns of hypotension and loss of consciousness.  Patient tested positive for COVID-19. UA was negative for leukocytes and nitrates.  Was said to be hypoxic with room air pulse oximetry of 88% and is currently on 2 L of oxygen.  Clinical Impression  Patient received in bed, mitts donned. She is alert, able to tell me her name, but not oriented to place, time or situation. Patient states she does not want to get up. She required max assist for supine to sitting edge of bed with poor sitting balance and poor participation due to cognition and motivation this date. She will continue to benefit from PT trial to continue to assess mobility. Recommending SNF upon discharge.          Recommendations for follow up therapy are one component of a multi-disciplinary discharge planning process, led by the attending physician.  Recommendations may be updated based on patient status, additional functional criteria and insurance authorization.  Follow Up Recommendations Skilled nursing-short term rehab (<3 hours/day) Can patient physically be transported by private vehicle: No    Assistance Recommended at Discharge Frequent or constant Supervision/Assistance  Patient can return home with the following  A lot of help with walking and/or transfers;A lot of help with bathing/dressing/bathroom;Assist for transportation    Equipment Recommendations None recommended by PT;Other (comment) (TBD)  Recommendations for Other Services       Functional Status Assessment Patient has had a recent decline in their functional status and  demonstrates the ability to make significant improvements in function in a reasonable and predictable amount of time.     Precautions / Restrictions Precautions Precautions: Fall Restrictions Weight Bearing Restrictions: No      Mobility  Bed Mobility Overal bed mobility: Needs Assistance Bed Mobility: Supine to Sit, Sit to Supine     Supine to sit: Max assist Sit to supine: Max assist   General bed mobility comments: patient requiring max assist for bed mobility. Cognitive limitations    Transfers                   General transfer comment: not attempted at this time    Ambulation/Gait                  Stairs            Wheelchair Mobility    Modified Rankin (Stroke Patients Only)       Balance Overall balance assessment: Needs assistance   Sitting balance-Leahy Scale: Poor                                       Pertinent Vitals/Pain Pain Assessment Pain Assessment: PAINAD Breathing: normal Negative Vocalization: none Facial Expression: smiling or inexpressive Body Language: relaxed Consolability: no need to console PAINAD Score: 0    Home Living Family/patient expects to be discharged to:: Skilled nursing facility                   Additional Comments: patient is poor historian, but per chart she is at [employer].    Prior Function Prior Level of  Function : Patient poor historian/Family not available             Mobility Comments: No family present, patient unable to tell me about PFL ADLs Comments: unsure     Hand Dominance        Extremity/Trunk Assessment   Upper Extremity Assessment Upper Extremity Assessment: Generalized weakness    Lower Extremity Assessment Lower Extremity Assessment: Generalized weakness       Communication   Communication: No difficulties  Cognition Arousal/Alertness: Awake/alert Behavior During Therapy: Flat affect, WFL for tasks  assessed/performed Overall Cognitive Status: No family/caregiver present to determine baseline cognitive functioning                                 General Comments: patient tells me her name, but is not oriented to place, time or situation        General Comments      Exercises     Assessment/Plan    PT Assessment Patient needs continued PT services  PT Problem List Decreased strength;Decreased activity tolerance;Decreased balance;Decreased mobility;Decreased cognition;Decreased safety awareness       PT Treatment Interventions Gait training;Therapeutic exercise;Functional mobility training;Therapeutic activities;Patient/family education;Balance training;Neuromuscular re-education    PT Goals (Current goals can be found in the Care Plan section)  Acute Rehab PT Goals Patient Stated Goal: none stated PT Goal Formulation: Patient unable to participate in goal setting Time For Goal Achievement: 10/18/22 Potential to Achieve Goals: Fair    Frequency Min 2X/week     Co-evaluation               AM-PAC PT "6 Clicks" Mobility  Outcome Measure Help needed turning from your back to your side while in a flat bed without using bedrails?: A Lot Help needed moving from lying on your back to sitting on the side of a flat bed without using bedrails?: A Lot Help needed moving to and from a bed to a chair (including a wheelchair)?: Total Help needed standing up from a chair using your arms (e.g., wheelchair or bedside chair)?: Total Help needed to walk in hospital room?: Total Help needed climbing 3-5 steps with a railing? : Total 6 Click Score: 8    End of Session   Activity Tolerance: Other (comment);Patient limited by fatigue (limited by cognition) Patient left: in bed;with bed alarm set;with restraints reapplied;Other (comment) (patient has mitts donned) Nurse Communication: Mobility status PT Visit Diagnosis: Muscle weakness (generalized) (M62.81);Other  abnormalities of gait and mobility (R26.89);Difficulty in walking, not elsewhere classified (R26.2)    Time: [id number] PT Time Calculation (min) (ACUTE ONLY): 10 min   Charges:   PT Evaluation $PT Eval Moderate Complexity: 1 Mod          Dresden Lozito, PT, GCS 10/04/22,1:05 PM

## 2022-10-05 DIAGNOSIS — U071 COVID-19: Secondary | ICD-10-CM

## 2022-10-05 DIAGNOSIS — Z7189 Other specified counseling: Secondary | ICD-10-CM

## 2022-10-05 DIAGNOSIS — Z515 Encounter for palliative care: Secondary | ICD-10-CM

## 2022-10-05 DIAGNOSIS — J9601 Acute respiratory failure with hypoxia: Secondary | ICD-10-CM | POA: Diagnosis not present

## 2022-10-05 DIAGNOSIS — F0153 Vascular dementia, unspecified severity, with mood disturbance: Secondary | ICD-10-CM

## 2022-10-05 LAB — CULTURE, BLOOD (ROUTINE X 2): Special Requests: ADEQUATE

## 2022-10-05 LAB — CBC WITH DIFFERENTIAL/PLATELET
Abs Immature Granulocytes: 0.01 10*3/uL (ref 0.00–0.07)
Basophils Absolute: 0 10*3/uL (ref 0.0–0.1)
Basophils Relative: 1 %
Eosinophils Absolute: 0.3 10*3/uL (ref 0.0–0.5)
Eosinophils Relative: 5 %
HCT: 33.7 % — ABNORMAL LOW (ref 36.0–46.0)
Hemoglobin: 11.3 g/dL — ABNORMAL LOW (ref 12.0–15.0)
Immature Granulocytes: 0 %
Lymphocytes Relative: 24 %
Lymphs Abs: 1.4 10*3/uL (ref 0.7–4.0)
MCH: 31.5 pg (ref 26.0–34.0)
MCHC: 33.5 g/dL (ref 30.0–36.0)
MCV: 93.9 fL (ref 80.0–100.0)
Monocytes Absolute: 0.5 10*3/uL (ref 0.1–1.0)
Monocytes Relative: 9 %
Neutro Abs: 3.6 10*3/uL (ref 1.7–7.7)
Neutrophils Relative %: 61 %
Platelets: 281 10*3/uL (ref 150–400)
RBC: 3.59 MIL/uL — ABNORMAL LOW (ref 3.87–5.11)
RDW: 15.1 % (ref 11.5–15.5)
WBC: 5.8 10*3/uL (ref 4.0–10.5)
nRBC: 0 % (ref 0.0–0.2)

## 2022-10-05 LAB — BASIC METABOLIC PANEL
Anion gap: 11 (ref 5–15)
BUN: 19 mg/dL (ref 8–23)
CO2: 22 mmol/L (ref 22–32)
Calcium: 8.7 mg/dL — ABNORMAL LOW (ref 8.9–10.3)
Chloride: 102 mmol/L (ref 98–111)
Creatinine, Ser: 1.2 mg/dL — ABNORMAL HIGH (ref 0.44–1.00)
GFR, Estimated: 45 mL/min — ABNORMAL LOW (ref >60)
Glucose, Bld: 99 mg/dL (ref 70–99)
Potassium: 3.4 mmol/L — ABNORMAL LOW (ref 3.5–5.1)
Sodium: 135 mmol/L (ref 135–145)

## 2022-10-05 MED ORDER — POTASSIUM CHLORIDE IN NACL 20-0.9 MEQ/L-% IV SOLN
INTRAVENOUS | Status: DC
  Filled 2022-10-05 (×5): qty 1000

## 2022-10-05 NOTE — Consult Note (Signed)
Consultation Note Date: 10/05/2022   Patient Name: Kristin [name]  DOB: 02-07-1941  MRN: [id number]  Age / Sex: 82 y.o., female  PCP: [name], NP Referring Physician: Sidney Ace, MD  Reason for Consultation: Establishing goals of care   HPI/Brief Hospital Course: 82 y.o. female  with past medical history of fibromyalgia, hypertension, neuropathy, arteriosclerotic dementia with associated depression, [name], [name] and [name] admitted from [facility] on 10/02/2022 with hypotension and loss of consciousness.  Found to be COVID (+). Acutely hypoxic, required supplemental O2 via [place]-resolved at this time, remains on RA.   Palliative medicine was consulted for assisting with goals of care conversations in the setting of underlying dementia with progression and poor PO intake.  Subjective:  Extensive chart review has been completed prior to meeting patient including labs, vital signs, imaging, progress notes, orders, and available advanced directive documents from current and previous encounters.  Introduced myself as a Designer, jewellery as a member of the palliative care team. Explained palliative medicine is specialized medical care for people living with serious illness. It focuses on providing relief from the symptoms and stress of a serious illness. The goal is to improve quality of life for both the patient and the family.   Visited with Kristin [name] at her bedside. Awake and alert, safety mitts in place. Able to communicate and answer simple questions; unable to answer orientation questions. Shared her husband and sons name. Unable to share location or events surrounding her admission to hospital.  Denies acute complaints of pain, discomfort, N/V. Assisted with eating a few bites of ice cream left on tray and sips of water-swallowing without difficulty.  Called and spoke with Kristin [name]-husband. Plan to meet at bedside later this  afternoon.  Met with Kristin [name]-spouse at bedside. Kristin [name] back from [place] as he is a long Dispensing optician with [employer]. He shares a brief life review. He and Kristin [name] have been married for over 11 years. They have 2 children-Kristin [name] their son lives local. They have several grandchildren.  Kristin [name] has been Kristin [name]' primary caretaker since her initial diagnosis of dementia. She suffered a fall resulting in pelvic fracture in January-discharged to [facility] for rehab. Kristin [name] feels as though she was progressing well with rehab and plan was to return home within a week or so. Unfortunately Kristin [name] contracted COVID causing her admission with hypotension and electrolyte abnormalities. He explains prior to this admission her appetite was not a concern-since admission he is aware she has had poor PO intake.  Confirmed DNR status. We discussed trajectory of dementia. Kristin [name] is aware of likely trajectory and has noticed a decline with this admission. He remains hopeful she will return to [facility] and regain some strength with ongoing hope she is able to return home. Discussed hospice services/comfort measures as an option as dementia progresses-he acknowledges and will consider this. Again, he wishes to pursuit rehab in hopes to have her back home.  Completion of MOST form done with Kristin [name].  -DNR -Limited Interventions -Determine use or limitation of antibiotics when infection occurs -IV fluids for a defined trial period -No feeding tube  I discussed importance of continued conversations with family/support persons and all members of their medical team regarding overall plan of care and treatment options ensuring decisions are in alignment with patients goals of care.  All questions/concerns addressed. Emotional support provided to patient/family/support persons. PMT will continue to follow and support patient as needed.  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    Appearance: She is not ill-appearing.  Pulmonary:     Effort: Pulmonary effort is normal. No respiratory distress.  Abdominal:     General: Abdomen is flat. There is no distension.     Palpations: Abdomen is soft.     Tenderness: There is no abdominal tenderness.  Skin:    General: Skin is warm and dry.  Neurological:     Mental Status: She is alert.     Vital Signs: BP (!) 109/55 (BP Location: Left Arm)   Pulse 69   Temp 98 F (36.7 C) (Axillary)   Resp 18   Ht '5\' 7"'$  (1.702 m)   Wt 59.6 kg   LMP 08/14/1989   SpO2 96%   BMI 20.60 kg/m  Pain Scale: Faces   Pain Score: 0-No pain   LBM: Last BM Date : 10/04/22 Baseline Weight: Weight: 71.4 kg Most recent weight: Weight: 59.6 kg       Palliative Assessment/Data: 60%   Assessment and Plan  SUMMARY OF RECOMMENDATIONS   DNR MOST form completed-to be scanned into chart, original copy left in physical chart PMT to continue to follow for ongoing needs and support   Thank you for this consult and allowing Palliative Medicine to participate in the care of Kristin [name]. Palliative medicine will continue to follow and assist as needed.   Time Total: 75 minutes  Signed by: Theodoro Grist, DNP, AGNP-C Palliative Medicine    Please contact Palliative Medicine Team phone at [phone] for questions and concerns.  For individual provider: See Shea Evans

## 2022-10-05 NOTE — TOC Progression Note (Addendum)
Transition of Care [facility]) - Progression Note    Patient Details  Name: Kristin [name] MRN: [id number] Date of Birth: 11/18/1940  Transition of Care [facility]) CM/SW Contact  Beverly Sessions, RN Phone Number: 10/05/2022, 9:35 AM  Clinical Narrative:      Apolonio Schneiders pending for SNF Signed DNR and EMS packet on chart Tanya at [facility] updated       Expected Discharge Plan and Services                                               Social Determinants of Health (SDOH) Interventions SDOH Screenings   Food Insecurity: No Food Insecurity (08/24/2022)  Housing: Low Risk  (08/24/2022)  Transportation Needs: No Transportation Needs (08/24/2022)  Utilities: Not At Risk (08/24/2022)  Alcohol Screen: Low Risk  (09/21/2021)  Depression (PHQ2-9): Low Risk  (01/11/2022)  Financial Resource Strain: Low Risk  (06/13/2021)  Physical Activity: Insufficiently Active (06/13/2021)  Social Connections: Moderately Isolated (06/13/2021)  Stress: No Stress Concern Present (01/11/2022)  Tobacco Use: Low Risk  (08/23/2022)    Readmission Risk Interventions     No data to display

## 2022-10-05 NOTE — Progress Notes (Signed)
PROGRESS NOTE    JULIANI KENISON  [id number] DOB: 1940-08-16 DOA: 10/02/2022 PCP: [name], NP    Brief Narrative:  [name], hypertension, peripheral neuropathy, arteriosclerotic dementia with depression, [name], [name], [name], who presents to the emergency department for chief concerns of hypotension and loss of consciousness. Patient tested positive for COVID-19. UA was negative for leukocytes and nitrates. Was said to be hypoxic with room air pulse oximetry of 88% and is currently on 2 L of oxygen.     02/19: Patient is seen and examined at the bedside.  She has no new complaints. 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  Patient on room air as of 2/21   Hypotension - With reported passing out at facility - Presumed secondary to poor p.o. intake and continued antihypertensive medication use in setting of polypharmacy: Patient is prescribed oxycodone-acetaminophen 5-325 mg, trazodone 50 mg nightly, gabapentin 400 mg p.o. twice daily Improved blood pressure control with IV fluid hydration  AKI Noted on 2/22 Start NS with 20 k at 75cc/hr Monitor Cr   COVID-19 - Airborne and contact precautions Supportive care   Hypokalemia Supplement potassium   Essential hypertension Continue diltiazem.  For now dose has been adjusted to immediate  release form as patient was found to be biting on her pill.  Restarted clonidine per home dose.   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[place]) Continue donepezil 10 mg nightly, duloxetine 30 mg p.o. twice daily - Trazodone 25 mg nightly as needed for sleep   Hypomagnesemia Supplement magnesium  Goals of care Poor QOL Poor PO intake Palliative care consult requested  DVT prophylaxis: SQ Lovenox Code Status: DNR Family Communication: Leonides Grills [phone] on 2/21 Disposition Plan: Status is: Inpatient Remains inpatient appropriate because: Resolving respiratory failure.  Now with AKI.  Insurance auth initiated.  Medically ready for dc within 24 h   Level of care: Telemetry Medical  Consultants:  None Procedures:  None  Antimicrobials: None   Subjective: Seen and examined.  Lethargic.  Awakens on command.  Alert to person only.  Objective: Vitals:   10/04/22 1540 10/04/22 2018 10/05/22 0516 10/05/22 0730  BP: (!) 166/76 (!) 158/60 (!) 104/54 (!) 109/55  Pulse: 89 73 71 69  Resp: 20 16 18   $ Temp: 97.7 F (36.5 C) 98 F (36.7 C) 98.1 F (36.7 C) 98 F (36.7 C)  TempSrc: Oral Oral Axillary Axillary  SpO2: 96% 97%  96%  Weight:      Height:        Intake/Output Summary (Last 24 hours) at 10/05/2022 1257 Last data filed at 10/04/2022 2200 Gross per 24 hour  Intake 100 ml  Output --  Net 100 ml   Filed Weights   10/02/22 1759 10/03/22 0107 10/04/22 1508  Weight: 71.4 kg 60.8 kg 59.6 kg    Examination:  General exam: NAD.  Frail. Respiratory system: Bibasilar crackles.  Normal work of breathing.  Room air Cardiovascular system: S1-S2, RRR, no murmurs, no pedal edema Gastrointestinal system: Soft, NT/ND, normal bowel sounds Central nervous system: Lethargic.  Oriented x 1.  No focal deficits Extremities: Symmetric 5 x 5 power. Skin: No rashes, lesions or ulcers Psychiatry: Judgement and insight appear impaired. Mood & affect flattened.     Data  Reviewed: I have personally reviewed following labs and imaging studies  CBC: Recent Labs  Lab 10/02/22 1430 10/03/22 0313 10/04/22 0745 10/05/22 1009  WBC 6.6 5.4 5.8 5.8  NEUTROABS 5.1  --  4.0 3.6  HGB 9.8* 9.6* 11.7* 11.3*  HCT 30.3* 29.4* 35.1* 33.7*  MCV 98.1 96.1 93.1 93.9  PLT 247 260 298 [id number]   Basic Metabolic Panel: Recent Labs  Lab 10/02/22 1430 10/03/22 0313 10/03/22 1549 10/04/22 0745 10/05/22 1009  NA 138 141 137 135 135  K 3.3* 2.5* 3.4* 3.0* 3.4*  CL 106 104 102 102 102  CO2 22 26 25 26 22  $ GLUCOSE 119* 89 91 102* 99  BUN 14 8 <5* <5* 19  CREATININE 1.15* 0.78 0.62 0.66 1.20*  CALCIUM 8.6* 8.3* 8.5* 8.5* 8.7*  MG 2.0 1.6*  --  2.0  --    GFR: Estimated Creatinine Clearance: 34 mL/min (A) (by C-G formula based on SCr of 1.2 mg/dL (H)). Liver Function Tests: Recent Labs  Lab 10/02/22 1430  AST 42*  ALT 13  ALKPHOS 133*  BILITOT 0.9  PROT 6.4*  ALBUMIN 2.9*   No results for input(s): "LIPASE", "AMYLASE" in the last 168 hours. No results for input(s): "AMMONIA" in the last 168 hours. Coagulation Profile: No results for input(s): "INR", "PROTIME" in the last 168 hours. Cardiac Enzymes: No results for input(s): "CKTOTAL", "CKMB", "CKMBINDEX", "TROPONINI" in the last 168 hours. BNP (last 3 results) No results for input(s): "PROBNP" in the last 8760 hours. HbA1C: No results for input(s): "HGBA1C" in the last 72 hours. CBG: No results for input(s): "GLUCAP" in the last 168 hours. Lipid Profile: No results for input(s): "CHOL", "HDL", "LDLCALC", "TRIG", "CHOLHDL", "LDLDIRECT" in the last 72 hours. Thyroid Function Tests: No results for input(s): "TSH", "T4TOTAL", "FREET4", "T3FREE", "THYROIDAB" in the last 72 hours. Anemia Panel: No results for input(s): "VITAMINB12", "FOLATE", "FERRITIN", "TIBC", "IRON", "RETICCTPCT" in the last 72 hours. Sepsis Labs: Recent Labs  Lab 10/02/22 1430  PROCALCITON <0.10    Recent Results (from the past 240  hour(s))  Resp panel by RT-PCR (RSV, Flu A&B, Covid) Anterior Nasal Swab     Status: Abnormal   Collection Time: 10/02/22  2:30 PM   Specimen: Anterior Nasal Swab  Result Value Ref Range Status   SARS Coronavirus 2 by RT PCR POSITIVE (A) NEGATIVE Final    Comment: (NOTE) SARS-CoV-2 target nucleic acids are DETECTED.  The SARS-CoV-2 RNA is generally detectable in upper respiratory specimens during the acute phase of infection. Positive results are indicative of the presence of the identified virus, but do not rule out bacterial infection or co-infection with other pathogens not detected by the test. Clinical correlation with patient history and other diagnostic information is necessary to determine patient infection status. The expected result is Negative.  Fact Sheet for Patients: EntrepreneurPulse.com.au  Fact Sheet for Healthcare Providers: IncredibleEmployment.be  This test is not yet approved or cleared by the [place] FDA and  has been authorized for detection and/or diagnosis of SARS-CoV-2 by FDA under an Emergency Use Authorization (EUA).  This EUA will remain in effect (meaning this test  can be used) for the duration of  the COVID-19 declaration under Section 564(b)(1) of the A ct, 21 U.S.C. section 360bbb-3(b)(1), unless the authorization is terminated or revoked sooner.     Influenza A by PCR NEGATIVE NEGATIVE Final   Influenza B by PCR NEGATIVE NEGATIVE Final    Comment: (NOTE) The Xpert Xpress SARS-CoV-2/FLU/RSV plus assay is intended as an aid in the diagnosis of influenza from Nasopharyngeal swab specimens and should not be used as a sole basis for treatment. Nasal washings and aspirates are unacceptable for Xpert Xpress SARS-CoV-2/FLU/RSV testing.  Fact Sheet for Patients: EntrepreneurPulse.com.au  Fact Sheet for Healthcare Providers: IncredibleEmployment.be  This test is not yet  approved or cleared by the [place] FDA and has been authorized for detection and/or diagnosis of SARS-CoV-2 by FDA under an Emergency Use Authorization (EUA). This EUA will remain in effect (meaning this test can be used) for the duration of the COVID-19 declaration under Section 564(b)(1) of the Act, 21 U.S.C. section 360bbb-3(b)(1), unless the authorization is terminated or revoked.     Resp Syncytial Virus by PCR NEGATIVE NEGATIVE Final    Comment: (NOTE) Fact Sheet for Patients: EntrepreneurPulse.com.au  Fact Sheet for Healthcare Providers: IncredibleEmployment.be  This test is not yet approved or cleared by the [place] FDA and has been authorized for detection and/or diagnosis of SARS-CoV-2 by FDA under an Emergency Use Authorization (EUA). This EUA will remain in effect (meaning this test can be used) for the duration of the COVID-19 declaration under Section 564(b)(1) of the Act, 21 U.S.C. section 360bbb-3(b)(1), unless the authorization is terminated or revoked.  Performed at [facility], [address] [place], [place] [address]   Culture, blood (routine x 2)     Status: None (Preliminary result)   Collection Time: 10/02/22  2:30 PM   Specimen: BLOOD  Result Value Ref Range Status   Specimen Description BLOOD BLOOD RIGHT WRIST  Final   Special Requests   Final    BOTTLES DRAWN AEROBIC AND ANAEROBIC Blood Culture adequate volume   Culture   Final    NO GROWTH 3 DAYS Performed at [facility], [address] [place], [place] [address]    Report Status PENDING  Incomplete  Culture, blood (routine x 2)     Status: Abnormal   Collection Time: 10/02/22  2:30 PM   Specimen: BLOOD LEFT HAND  Result Value Ref Range Status   Specimen Description   Final    BLOOD LEFT HAND Performed at [facility], [place] [address] [place], [place] [address]    Special Requests   Final    BOTTLES DRAWN AEROBIC  AND ANAEROBIC Blood Culture adequate volume Performed at [facility], [address] [place], [place] [address]    Culture  Setup Time   Final    GRAM POSITIVE COCCI ANAEROBIC BOTTLE ONLY CRITICAL RESULT CALLED TO, READ BACK BY AND VERIFIED WITH: CARISSA [facility] 10/03/22 1222 MW    Culture (A)  Final    STAPHYLOCOCCUS CAPITIS THE SIGNIFICANCE OF ISOLATING THIS ORGANISM FROM A SINGLE SET OF BLOOD CULTURES WHEN MULTIPLE SETS ARE DRAWN IS UNCERTAIN. PLEASE NOTIFY THE MICROBIOLOGY DEPARTMENT WITHIN ONE WEEK IF SPECIATION AND SENSITIVITIES ARE REQUIRED. Performed at [facility], [place] [address] [place], [place] [address]    Report Status 10/05/2022 FINAL  Final  Blood Culture ID Panel (Reflexed)     Status: Abnormal   Collection Time: 10/02/22  2:30 PM  Result Value  Ref Range Status   Enterococcus faecalis NOT DETECTED NOT DETECTED Final   Enterococcus Faecium NOT DETECTED NOT DETECTED Final   Listeria monocytogenes NOT DETECTED NOT DETECTED Final   Staphylococcus species DETECTED (A) NOT DETECTED Final    Comment: CRITICAL RESULT CALLED TO, READ BACK BY AND VERIFIED WITH: CARISSA [facility] 10/03/22 1222 MW    Staphylococcus aureus (BCID) NOT DETECTED NOT DETECTED Final   Staphylococcus epidermidis NOT DETECTED NOT DETECTED Final   Staphylococcus lugdunensis NOT DETECTED NOT DETECTED Final   Streptococcus species NOT DETECTED NOT DETECTED Final   Streptococcus agalactiae NOT DETECTED NOT DETECTED Final   Streptococcus pneumoniae NOT DETECTED NOT DETECTED Final   Streptococcus pyogenes NOT DETECTED NOT DETECTED Final   A.calcoaceticus-baumannii NOT DETECTED NOT DETECTED Final   Bacteroides fragilis NOT DETECTED NOT DETECTED Final   Enterobacterales NOT DETECTED NOT DETECTED Final   Enterobacter cloacae complex NOT DETECTED NOT DETECTED Final   Escherichia coli NOT DETECTED NOT DETECTED Final   Klebsiella aerogenes NOT DETECTED NOT DETECTED Final   Klebsiella oxytoca NOT  DETECTED NOT DETECTED Final   Klebsiella pneumoniae NOT DETECTED NOT DETECTED Final   Proteus species NOT DETECTED NOT DETECTED Final   Salmonella species NOT DETECTED NOT DETECTED Final   Serratia marcescens NOT DETECTED NOT DETECTED Final   Haemophilus influenzae NOT DETECTED NOT DETECTED Final   Neisseria meningitidis NOT DETECTED NOT DETECTED Final   Pseudomonas aeruginosa NOT DETECTED NOT DETECTED Final   Stenotrophomonas maltophilia NOT DETECTED NOT DETECTED Final   Candida albicans NOT DETECTED NOT DETECTED Final   Candida auris NOT DETECTED NOT DETECTED Final   Candida glabrata NOT DETECTED NOT DETECTED Final   Candida krusei NOT DETECTED NOT DETECTED Final   Candida parapsilosis NOT DETECTED NOT DETECTED Final   Candida tropicalis NOT DETECTED NOT DETECTED Final   Cryptococcus neoformans/gattii NOT DETECTED NOT DETECTED Final    Comment: Performed at [facility], [address] [place], [place] [address]  MRSA Next Gen by PCR, Nasal     Status: None   Collection Time: 10/03/22  1:55 AM   Specimen: Nasal Mucosa; Nasal Swab  Result Value Ref Range Status   MRSA by PCR Next Gen NOT DETECTED NOT DETECTED Final    Comment: (NOTE) The GeneXpert MRSA Assay (FDA approved for NASAL specimens only), is one component of a comprehensive MRSA colonization surveillance program. It is not intended to diagnose MRSA infection nor to guide or monitor treatment for MRSA infections. Test performance is not FDA approved in patients less than 32 years old. Performed at [facility], [address] [place], [place] [address]          Radiology Studies: No results found.      Scheduled Meds:  aspirin  81 mg Oral Daily   Chlorhexidine Gluconate Cloth  6 each Topical Q0600   cloNIDine  0.2 mg Oral Q12H   diltiazem  60 mg Oral Q6H   donepezil  10 mg Oral QHS   DULoxetine  30 mg Oral BID   enoxaparin (LOVENOX) injection  40 mg Subcutaneous Q24H   feeding  supplement  237 mL Oral TID BM   ferrous sulfate  325 mg Oral Q breakfast   gabapentin  400 mg Oral BID   multivitamin with minerals  1 tablet Oral Daily   tamsulosin  0.4 mg Oral QHS   Continuous Infusions:  0.9 % NaCl with KCl 20 mEq / L       LOS: 3 days  Sidney Ace, MD [facility]   If 7PM-7AM, please contact night-coverage  10/05/2022, 12:57 PM

## 2022-10-05 NOTE — Progress Notes (Signed)
PT Cancellation Note  Patient Details Name: Kristin [name] MRN: [id number] DOB: Sep 17, 1940   Cancelled Treatment:    Reason Eval/Treat Not Completed: Other (comment). Patient currently being fed by NT. Will re-attempt later as time allows.    Ayman Brull 10/05/2022, 1:49 PM

## 2022-10-05 NOTE — Care Management Important Message (Signed)
Important Message  Patient Details  Name: Kristin [name] MRN: [id number] Date of Birth: [birth year]   Medicare Important Message Given:  Other (see comment)  Attempted to reach patient via room phone [phone]) to review Medicare IM.  Unable to reach patient at this time.  Dannette Barbara 10/05/2022, 11:59 AM

## 2022-10-06 ENCOUNTER — Inpatient Hospital Stay: Payer: PPO

## 2022-10-06 DIAGNOSIS — J9601 Acute respiratory failure with hypoxia: Secondary | ICD-10-CM | POA: Diagnosis not present

## 2022-10-06 LAB — BASIC METABOLIC PANEL
Anion gap: 8 (ref 5–15)
BUN: 26 mg/dL — ABNORMAL HIGH (ref 8–23)
CO2: 24 mmol/L (ref 22–32)
Calcium: 8.7 mg/dL — ABNORMAL LOW (ref 8.9–10.3)
Chloride: 108 mmol/L (ref 98–111)
Creatinine, Ser: 0.95 mg/dL (ref 0.44–1.00)
GFR, Estimated: 60 mL/min — ABNORMAL LOW (ref >60)
Glucose, Bld: 123 mg/dL — ABNORMAL HIGH (ref 70–99)
Potassium: 3.7 mmol/L (ref 3.5–5.1)
Sodium: 140 mmol/L (ref 135–145)

## 2022-10-06 NOTE — Progress Notes (Signed)
Physical Therapy Treatment Patient Details Name: Kristin [name] MRN: [id number] DOB: Nov 17, 1940 Today's Date: 10/06/2022   History of Present Illness Kristin [name] is an 82 year old female with history of fibromyalgia, hypertension, peripheral neuropathy, arteriosclerotic dementia with depression, [name], [name], [name], who presents to the emergency department for chief concerns of hypotension and loss of consciousness.  Patient tested positive for COVID-19. UA was negative for leukocytes and nitrates.  Was said to be hypoxic with room air pulse oximetry of 88% and is currently on 2 L of oxygen.    PT Comments    Patient received in bed resting. She is agreeable to PT session. More talkative than last session. She required mod/max assist for supine to sit and mod A to stand from slightly elevated bed. She was able to side step with min a and RW. She will continue to benefit from skilled PT to improve functional mobility and independence.     Recommendations for follow up therapy are one component of a multi-disciplinary discharge planning process, led by the attending physician.  Recommendations may be updated based on patient status, additional functional criteria and insurance authorization.  Follow Up Recommendations  Skilled nursing-short term rehab (<3 hours/day) Can patient physically be transported by private vehicle: No   Assistance Recommended at Discharge Frequent or constant Supervision/Assistance  Patient can return home with the following A lot of help with walking and/or transfers;A lot of help with bathing/dressing/bathroom;Assist for transportation   Equipment Recommendations  None recommended by PT;Other (comment)    Recommendations for Other Services       Precautions / Restrictions Precautions Precautions: Fall Restrictions Weight Bearing Restrictions: No     Mobility  Bed Mobility Overal bed mobility: Needs Assistance Bed Mobility: Supine to Sit, Sit  to Supine     Supine to sit: Mod assist Sit to supine: Min assist   General bed mobility comments: mod Assist to raise trunk and assist in moving legs off bed.    Transfers Overall transfer level: Needs assistance Equipment used: Rolling walker (2 wheels) Transfers: Sit to/from Stand Sit to Stand: From elevated surface, Min assist/modA           General transfer comment: patient able to stand from slightly elevated bed with min A. Cues and assist needed for correct hand placement on walker    Ambulation/Gait Ambulation/Gait assistance: Min assist Gait Distance (Feet): 4 Feet Assistive device: Rolling walker (2 wheels) Gait Pattern/deviations: Step-to pattern Gait velocity: decr     General Gait Details: patient able to side step along edge of bed with min A   Stairs             Wheelchair Mobility    Modified Rankin (Stroke Patients Only)       Balance Overall balance assessment: Needs assistance Sitting-balance support: Feet supported Sitting balance-Leahy Scale: Fair     Standing balance support: Bilateral upper extremity supported, During functional activity, Reliant on assistive device for balance Standing balance-Leahy Scale: Fair                              Cognition Arousal/Alertness: Lethargic Behavior During Therapy: Flat affect Overall Cognitive Status: History of cognitive impairments - at baseline                                 General Comments: patient tells me her name,  but is not oriented to place, time or situation        Exercises      General Comments        Pertinent Vitals/Pain Pain Assessment Pain Assessment: PAINAD Breathing: normal Negative Vocalization: none Facial Expression: smiling or inexpressive Body Language: relaxed Consolability: no need to console PAINAD Score: 0    Home Living                          Prior Function            PT Goals (current goals can  now be found in the care plan section) Acute Rehab PT Goals Patient Stated Goal: per husband, go back to SNF for a little while then home with him when stronger PT Goal Formulation: With family Time For Goal Achievement: 10/18/22 Potential to Achieve Goals: Good Progress towards PT goals: Progressing toward goals    Frequency    Min 2X/week      PT Plan Current plan remains appropriate    Co-evaluation              AM-PAC PT "6 Clicks" Mobility   Outcome Measure  Help needed turning from your back to your side while in a flat bed without using bedrails?: A Lot Help needed moving from lying on your back to sitting on the side of a flat bed without using bedrails?: A Lot Help needed moving to and from a bed to a chair (including a wheelchair)?: A Lot Help needed standing up from a chair using your arms (e.g., wheelchair or bedside chair)?: A Lot Help needed to walk in hospital room?: A Lot Help needed climbing 3-5 steps with a railing? : Total 6 Click Score: 11    End of Session Equipment Utilized During Treatment: Gait belt Activity Tolerance: Patient limited by fatigue Patient left: in bed;with call bell/phone within reach;with bed alarm set;Other (comment) (mitts donned) Nurse Communication: Mobility status PT Visit Diagnosis: Muscle weakness (generalized) (M62.81);Other abnormalities of gait and mobility (R26.89);Difficulty in walking, not elsewhere classified (R26.2)     Time: [id number] PT Time Calculation (min) (ACUTE ONLY): 13 min  Charges:  $Therapeutic Activity: 8-22 mins                     Lyndia Bury, PT, GCS 10/06/22,1:30 PM

## 2022-10-06 NOTE — Progress Notes (Signed)
PROGRESS NOTE    KALEESE BRUNO  [id number] DOB: 30-Oct-1940 DOA: 10/02/2022 PCP: [name], NP    Brief Narrative:  Ms. Taisa Cowell is an 82 year old female with history of fibromyalgia, hypertension, peripheral neuropathy, arteriosclerotic dementia with depression, [name], [name], [name], who presents to the emergency department for chief concerns of hypotension and loss of consciousness. Patient tested positive for COVID-19. UA was negative for leukocytes and nitrates. Was said to be hypoxic with room air pulse oximetry of 88% and is currently on 2 L of oxygen.     02/19: Patient is seen and examined at the bedside.  She has no new complaints. 2/21: On room air 2/22: On RA 2/23: Remained stable on room air.  Mobility significantly diminished from baseline.  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  Patient on room air as of 2/21 -Endorsing shortness of breath on 2/23.  Chest x-ray with bibasilar atelectasis.  Attempt incentive spirometry.  If shortness of breath persist can consider some low-dose Lasix challenge.   Hypotension - With reported passing out at facility - Presumed secondary to poor p.o. intake and continued antihypertensive medication use in setting of polypharmacy: Patient is prescribed oxycodone-acetaminophen 5-325 mg, trazodone 50 mg nightly, gabapentin 400 mg p.o. twice daily Improved  blood pressure control with IV fluid hydration  AKI Noted on 2/22 Start NS with 20 k at 75cc/hr Monitor Cr   COVID-19 - Airborne and contact precautions Supportive care   Hypokalemia Supplement potassium   Essential hypertension Continue diltiazem.  For now dose has been adjusted to immediate release form as patient was found to be biting on her pill.  Restarted clonidine per home dose.   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HCC) Continue donepezil 10 mg nightly, duloxetine 30 mg p.o. twice daily - Trazodone 25 mg nightly as needed for sleep   Hypomagnesemia Supplement magnesium  Goals of care Poor QOL Poor PO intake Palliative care consult requested Patient remains DNR.  I believe this patient's functional status is reduced from her baseline.  Given the acute insult of the COVID-19 infection I do believe she has recovery potential.  In my opinion the patient will require skilled nursing facility as her next level of care  DVT prophylaxis: SQ Lovenox Code Status: DNR Family Communication: Leonides Grills [phone] on 2/21 Disposition Plan: Status is: Inpatient Remains inpatient appropriate because: Resolving respiratory failure.  Now with AKI.     Level of care: Telemetry Medical  Consultants:  None Procedures:  None  Antimicrobials: None   Subjective: Seen and examined.  More awake this morning.  Endorsing shortness of breath  Objective: Vitals:   10/05/22 1602 10/05/22 1939 10/06/22 0435 10/06/22 0804  BP: (!) 115/57 131/72 132/62 (!) 150/62  Pulse: 97 (!) 105 80 86  Resp: '18 16 18 20  '$ Temp: 98.4 F (36.9 C) 98.1 F (36.7 C) 98 F (36.7 C) 98.4 F (36.9 C)  TempSrc: Oral Oral Axillary   SpO2: 97%  96% 97% 99%  Weight:      Height:        Intake/Output Summary (Last 24 hours) at 10/06/2022 1112 Last data filed at 10/06/2022 0500 Gross per 24 hour  Intake 991.26 ml  Output 850 ml  Net 141.26 ml   Filed Weights   10/02/22 1759 10/03/22  0107 10/04/22 1508  Weight: 71.4 kg 60.8 kg 59.6 kg    Examination:  General exam: NAD.  Frail-appearing Respiratory system: Bibasilar crackles.  Normal work of breathing.  Room air Cardiovascular system: S1-S2, RRR, no murmurs, no pedal edema Gastrointestinal system: Soft, NT/ND, normal bowel sounds Central nervous system: Lethargic.  Oriented x 1.  No focal deficits Extremities: Symmetric 5 x 5 power. Skin: No rashes, lesions or ulcers Psychiatry: Judgement and insight appear impaired. Mood & affect flattened.     Data Reviewed: I have personally reviewed following labs and imaging studies  CBC: Recent Labs  Lab 10/02/22 1430 10/03/22 0313 10/04/22 0745 10/05/22 1009  WBC 6.6 5.4 5.8 5.8  NEUTROABS 5.1  --  4.0 3.6  HGB 9.8* 9.6* 11.7* 11.3*  HCT 30.3* 29.4* 35.1* 33.7*  MCV 98.1 96.1 93.1 93.9  PLT 247 260 298 [id number]   Basic Metabolic Panel: Recent Labs  Lab 10/02/22 1430 10/03/22 0313 10/03/22 1549 10/04/22 0745 10/05/22 1009  NA 138 141 137 135 135  K 3.3* 2.5* 3.4* 3.0* 3.4*  CL 106 104 102 102 102  CO2 '22 26 25 26 22  '$ GLUCOSE 119* 89 91 102* 99  BUN 14 8 <5* <5* 19  CREATININE 1.15* 0.78 0.62 0.66 1.20*  CALCIUM 8.6* 8.3* 8.5* 8.5* 8.7*  MG 2.0 1.6*  --  2.0  --    GFR: Estimated Creatinine Clearance: 34 mL/min (A) (by C-G formula based on SCr of 1.2 mg/dL (H)). Liver Function Tests: Recent Labs  Lab 10/02/22 1430  AST 42*  ALT 13  ALKPHOS 133*  BILITOT 0.9  PROT 6.4*  ALBUMIN 2.9*   No results for input(s): "LIPASE", "AMYLASE" in the last 168 hours. No results for input(s): "AMMONIA" in the last 168 hours. Coagulation Profile: No results for input(s): "INR", "PROTIME" in the last 168 hours. Cardiac Enzymes: No results for input(s): "CKTOTAL", "CKMB", "CKMBINDEX", "TROPONINI" in the last 168 hours. BNP (last 3 results) No results for input(s): "PROBNP" in the last 8760 hours. HbA1C: No results for input(s): "HGBA1C" in the last 72  hours. CBG: No results for input(s): "GLUCAP" in the last 168 hours. Lipid Profile: No results for input(s): "CHOL", "HDL", "LDLCALC", "TRIG", "CHOLHDL", "LDLDIRECT" in the last 72 hours. Thyroid Function Tests: No results for input(s): "TSH", "T4TOTAL", "FREET4", "T3FREE", "THYROIDAB" in the last 72 hours. Anemia Panel: No results for input(s): "VITAMINB12", "FOLATE", "FERRITIN", "TIBC", "IRON", "RETICCTPCT" in the last 72 hours. Sepsis Labs: Recent Labs  Lab 10/02/22 1430  PROCALCITON <0.10    Recent Results (from the past 240 hour(s))  Resp panel by RT-PCR (RSV, Flu A&B, Covid) Anterior Nasal Swab     Status: Abnormal   Collection Time: 10/02/22  2:30 PM   Specimen: Anterior Nasal Swab  Result Value Ref Range Status   SARS Coronavirus 2 by RT PCR POSITIVE (A) NEGATIVE Final    Comment: (NOTE) SARS-CoV-2 target nucleic acids are DETECTED.  The SARS-CoV-2 RNA is generally detectable in upper respiratory specimens during the acute phase of infection. Positive results are indicative of the presence of the identified virus, but do not rule out bacterial infection or co-infection with other  pathogens not detected by the test. Clinical correlation with patient history and other diagnostic information is necessary to determine patient infection status. The expected result is Negative.  Fact Sheet for Patients: EntrepreneurPulse.com.au  Fact Sheet for Healthcare Providers: IncredibleEmployment.be  This test is not yet approved or cleared by the [place] FDA and  has been authorized for detection and/or diagnosis of SARS-CoV-2 by FDA under an Emergency Use Authorization (EUA).  This EUA will remain in effect (meaning this test can be used) for the duration of  the COVID-19 declaration under Section 564(b)(1) of the A ct, 21 U.S.C. section 360bbb-3(b)(1), unless the authorization is terminated or revoked sooner.     Influenza A by PCR  NEGATIVE NEGATIVE Final   Influenza B by PCR NEGATIVE NEGATIVE Final    Comment: (NOTE) The Xpert Xpress SARS-CoV-2/FLU/RSV plus assay is intended as an aid in the diagnosis of influenza from Nasopharyngeal swab specimens and should not be used as a sole basis for treatment. Nasal washings and aspirates are unacceptable for Xpert Xpress SARS-CoV-2/FLU/RSV testing.  Fact Sheet for Patients: EntrepreneurPulse.com.au  Fact Sheet for Healthcare Providers: IncredibleEmployment.be  This test is not yet approved or cleared by the [place] FDA and has been authorized for detection and/or diagnosis of SARS-CoV-2 by FDA under an Emergency Use Authorization (EUA). This EUA will remain in effect (meaning this test can be used) for the duration of the COVID-19 declaration under Section 564(b)(1) of the Act, 21 U.S.C. section 360bbb-3(b)(1), unless the authorization is terminated or revoked.     Resp Syncytial Virus by PCR NEGATIVE NEGATIVE Final    Comment: (NOTE) Fact Sheet for Patients: EntrepreneurPulse.com.au  Fact Sheet for Healthcare Providers: IncredibleEmployment.be  This test is not yet approved or cleared by the [place] FDA and has been authorized for detection and/or diagnosis of SARS-CoV-2 by FDA under an Emergency Use Authorization (EUA). This EUA will remain in effect (meaning this test can be used) for the duration of the COVID-19 declaration under Section 564(b)(1) of the Act, 21 U.S.C. section 360bbb-3(b)(1), unless the authorization is terminated or revoked.  Performed at [facility], [address] [place], [place] [address]   Culture, blood (routine x 2)     Status: None (Preliminary result)   Collection Time: 10/02/22  2:30 PM   Specimen: BLOOD  Result Value Ref Range Status   Specimen Description BLOOD BLOOD RIGHT WRIST  Final   Special Requests   Final    BOTTLES  DRAWN AEROBIC AND ANAEROBIC Blood Culture adequate volume   Culture   Final    NO GROWTH 4 DAYS Performed at [facility], [address], [place], [place] [address]    Report Status PENDING  Incomplete  Culture, blood (routine x 2)     Status: Abnormal   Collection Time: 10/02/22  2:30 PM   Specimen: BLOOD LEFT HAND  Result Value Ref Range Status   Specimen Description   Final    BLOOD LEFT HAND Performed at [facility], [place] [address], [place], [place] [address]    Special Requests   Final    BOTTLES DRAWN AEROBIC AND ANAEROBIC Blood Culture adequate volume Performed at [facility], [address] [place], [place] [address]    Culture  Setup Time   Final    GRAM POSITIVE COCCI ANAEROBIC BOTTLE ONLY CRITICAL RESULT CALLED TO, READ BACK BY AND VERIFIED WITH: CARISSA [facility] 10/03/22 1222 MW    Culture (A)  Final  STAPHYLOCOCCUS CAPITIS THE SIGNIFICANCE OF ISOLATING THIS ORGANISM FROM A SINGLE SET OF BLOOD CULTURES WHEN MULTIPLE SETS ARE DRAWN IS UNCERTAIN. PLEASE NOTIFY THE MICROBIOLOGY DEPARTMENT WITHIN ONE WEEK IF SPECIATION AND SENSITIVITIES ARE REQUIRED. Performed at [facility], [place] [address] [place], [place] [address]    Report Status 10/05/2022 FINAL  Final  Blood Culture ID Panel (Reflexed)     Status: Abnormal   Collection Time: 10/02/22  2:30 PM  Result Value Ref Range Status   Enterococcus faecalis NOT DETECTED NOT DETECTED Final   Enterococcus Faecium NOT DETECTED NOT DETECTED Final   Listeria monocytogenes NOT DETECTED NOT DETECTED Final   Staphylococcus species DETECTED (A) NOT DETECTED Final    Comment: CRITICAL RESULT CALLED TO, READ BACK BY AND VERIFIED WITH: CARISSA [facility] 10/03/22 1222 MW    Staphylococcus aureus (BCID) NOT DETECTED NOT DETECTED Final   Staphylococcus epidermidis NOT DETECTED NOT DETECTED Final   Staphylococcus lugdunensis NOT DETECTED NOT DETECTED Final   Streptococcus species NOT DETECTED NOT DETECTED  Final   Streptococcus agalactiae NOT DETECTED NOT DETECTED Final   Streptococcus pneumoniae NOT DETECTED NOT DETECTED Final   Streptococcus pyogenes NOT DETECTED NOT DETECTED Final   A.calcoaceticus-baumannii NOT DETECTED NOT DETECTED Final   Bacteroides fragilis NOT DETECTED NOT DETECTED Final   Enterobacterales NOT DETECTED NOT DETECTED Final   Enterobacter cloacae complex NOT DETECTED NOT DETECTED Final   Escherichia coli NOT DETECTED NOT DETECTED Final   Klebsiella aerogenes NOT DETECTED NOT DETECTED Final   Klebsiella oxytoca NOT DETECTED NOT DETECTED Final   Klebsiella pneumoniae NOT DETECTED NOT DETECTED Final   Proteus species NOT DETECTED NOT DETECTED Final   Salmonella species NOT DETECTED NOT DETECTED Final   Serratia marcescens NOT DETECTED NOT DETECTED Final   Haemophilus influenzae NOT DETECTED NOT DETECTED Final   Neisseria meningitidis NOT DETECTED NOT DETECTED Final   Pseudomonas aeruginosa NOT DETECTED NOT DETECTED Final   Stenotrophomonas maltophilia NOT DETECTED NOT DETECTED Final   Candida albicans NOT DETECTED NOT DETECTED Final   Candida auris NOT DETECTED NOT DETECTED Final   Candida glabrata NOT DETECTED NOT DETECTED Final   Candida krusei NOT DETECTED NOT DETECTED Final   Candida parapsilosis NOT DETECTED NOT DETECTED Final   Candida tropicalis NOT DETECTED NOT DETECTED Final   Cryptococcus neoformans/gattii NOT DETECTED NOT DETECTED Final    Comment: Performed at [facility], [address] [place], [place] [address]  MRSA Next Gen by PCR, Nasal     Status: None   Collection Time: 10/03/22  1:55 AM   Specimen: Nasal Mucosa; Nasal Swab  Result Value Ref Range Status   MRSA by PCR Next Gen NOT DETECTED NOT DETECTED Final    Comment: (NOTE) The GeneXpert MRSA Assay (FDA approved for NASAL specimens only), is one component of a comprehensive MRSA colonization surveillance program. It is not intended to diagnose MRSA infection nor to guide or  monitor treatment for MRSA infections. Test performance is not FDA approved in patients less than 97 years old. Performed at [facility], [address], [place], [place] [address]          Radiology Studies: DG Chest [place] 1 View  Result Date: 10/06/2022 CLINICAL DATA:  10026 Shortness of breath 10026 EXAM: PORTABLE CHEST 1 VIEW COMPARISON:  10/02/2022 FINDINGS: The heart size and mediastinal contours are within normal limits. Aortic atherosclerosis. Low lung volumes with streaky bibasilar opacities. Blunting of the left costophrenic angle. No pneumothorax. The visualized skeletal structures  are unremarkable. IMPRESSION: 1. Low lung volumes with streaky bibasilar opacities, which may represent atelectasis versus pneumonia. 2. Possible small left pleural effusion. Electronically Signed   By: Davina Poke D.O.   On: 10/06/2022 09:53        Scheduled Meds:  aspirin  81 mg Oral Daily   Chlorhexidine Gluconate Cloth  6 each Topical Q0600   cloNIDine  0.2 mg Oral Q12H   diltiazem  60 mg Oral Q6H   donepezil  10 mg Oral QHS   DULoxetine  30 mg Oral BID   enoxaparin (LOVENOX) injection  40 mg Subcutaneous Q24H   feeding supplement  237 mL Oral TID BM   ferrous sulfate  325 mg Oral Q breakfast   gabapentin  400 mg Oral BID   multivitamin with minerals  1 tablet Oral Daily   tamsulosin  0.4 mg Oral QHS   Continuous Infusions:  0.9 % NaCl with KCl 20 mEq / L 75 mL/hr at 10/06/22 0432     LOS: 4 days      Sidney Ace, MD [facility]   If 7PM-7AM, please contact night-coverage  10/06/2022, 11:12 AM

## 2022-10-06 NOTE — Progress Notes (Signed)
Mobility Specialist - Progress Note   10/06/22 1000  Mobility  Activity Ambulated with assistance in room;Transferred from bed to chair  Level of Assistance Minimal assist, patient does 75% or more  Assistive Device Front wheel walker  Distance Ambulated (ft) 3 ft  Activity Response Tolerated well  $Mobility charge 1 Mobility     Pt sleeping in bed upon arrival, utilizing RA. Awakens to voice and lethargic, but agreeable to activity. Confused, but follows commands intermittently. Pt completed bed mobility with maxA; difficulty sequencing task d/t limited cognition. Fair sitting balance--mild R lean in sitting. STS x3 from EOB with modA progressing to minA. VC for hand placement. Incontinent BM upon standing. Pt does endorse fear of falling despite having +2 for safety. Limited standing tolerance.  Pt step-pivot to recliner with minA---manual facilitation of RW to encourage continued participation of transfer. Pt left in recliner with alarm set, needs in reach. Breakfast tray set up in front of pt, took a few bites and sips of drink. Spouse at bedside. RN notified.    Kathee Delton Mobility Specialist 10/06/22, 11:12 AM

## 2022-10-06 NOTE — TOC Progression Note (Signed)
Transition of Care [facility]) - Progression Note    Patient Details  Name: ICES KAMERER MRN: [id number] Date of Birth: May 20, 1941  [facility] CM/SW Contact  Beverly Sessions, RN Phone Number: 10/06/2022, 10:02 AM  Clinical Narrative:     Notified by Lynelle Smoke at [facility] that medical director has reviewed and is offering a peer to peer to be completed by 2pm  MD has called for peer to peer and was told it will stay "in que" and [facility] requested for patient to be seen again by PT       Expected Discharge Plan and Services                                               Social Determinants of Health ([place]) Interventions SDOH Screenings   Food Insecurity: No Food Insecurity (08/24/2022)  Housing: Low Risk  (08/24/2022)  Transportation Needs: No Transportation Needs (08/24/2022)  Utilities: Not At Risk (08/24/2022)  Alcohol Screen: Low Risk  (09/21/2021)  Depression (PHQ2-9): Low Risk  (01/11/2022)  Financial Resource Strain: Low Risk  (06/13/2021)  Physical Activity: Insufficiently Active (06/13/2021)  Social Connections: Moderately Isolated (06/13/2021)  Stress: No Stress Concern Present (01/11/2022)  Tobacco Use: Low Risk  (08/23/2022)    Readmission Risk Interventions     No data to display

## 2022-10-07 DIAGNOSIS — J9601 Acute respiratory failure with hypoxia: Secondary | ICD-10-CM | POA: Diagnosis not present

## 2022-10-07 LAB — CULTURE, BLOOD (ROUTINE X 2)
Culture: NO GROWTH
Special Requests: ADEQUATE

## 2022-10-07 NOTE — Progress Notes (Signed)
PROGRESS NOTE    Kristin [name]  [id number] DOB: Mar 04, 1941 DOA: 10/02/2022 PCP: [name], NP    Brief Narrative:  [name], hypertension, peripheral neuropathy, arteriosclerotic dementia with depression, [name], [name], [name], who presents to the emergency department for chief concerns of hypotension and loss of consciousness. Patient tested positive for COVID-19. UA was negative for leukocytes and nitrates. Was said to be hypoxic with room air pulse oximetry of 88% and is currently on 2 L of oxygen.     02/19: Patient is seen and examined at the bedside.  She has no new complaints. 2/21: On room air 2/22: On RA 2/23: Remained stable on room air.  Mobility significantly diminished from baseline.  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  Patient on room air as of 2/21 -Endorsing shortness of breath on 2/23.  Chest x-ray with bibasilar atelectasis.  Attempt incentive spirometry.  If shortness of breath persist can consider some low-dose Lasix challenge. -2/24: Shortness of breath improved.  Patient on room air   Hypotension - With reported passing out at facility - Presumed secondary to poor p.o. intake and continued antihypertensive medication use in setting of polypharmacy: Patient is prescribed  oxycodone-acetaminophen 5-325 mg, trazodone 50 mg nightly, gabapentin 400 mg p.o. twice daily Improved blood pressure control with IV fluid hydration  AKI Noted on 2/22 Start NS with 20 k at 75cc/hr Monitor Cr   COVID-19 - Airborne and contact precautions Supportive care   Hypokalemia Supplement potassium   Essential hypertension Continue diltiazem.  For now dose has been adjusted to immediate release form as patient was found to be biting on her pill.  Restarted clonidine per home dose.   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HCC) Continue donepezil 10 mg nightly, duloxetine 30 mg p.o. twice daily - Trazodone 25 mg nightly as needed for sleep   Hypomagnesemia Supplement magnesium  Goals of care Poor QOL Poor PO intake Palliative care consult requested Patient remains DNR.  I believe this patient's functional status is reduced from her baseline.  Given the acute insult of the COVID-19 infection I do believe she has recovery potential.  In my opinion the patient will require skilled nursing facility as her next level of care  DVT prophylaxis: SQ Lovenox Code Status: DNR Family Communication: Leonides Grills [phone] on 2/21 Disposition Plan: Status is: Inpatient Remains inpatient appropriate because: Resolving respiratory failure.  Now with AKI.     Level of care: Telemetry Medical  Consultants:  None Procedures:  None  Antimicrobials: None   Subjective: Seen and examined.  More awake this morning.  No complaints  Objective: Vitals:   10/06/22 1554 10/06/22 2011 10/07/22 0450 10/07/22 0819  BP: (!) 152/52 (!) 156/61 (!) 174/52 (!) 155/57  Pulse: 70 81 81 78  Resp: '18 16 14 18  '$ Temp: 98.7 F (37.1 C) 98.2 F (36.8 C) 98.6 F (37 C)  98.2 F (36.8 C)  TempSrc: Oral Oral Oral Oral  SpO2: 100% 99% 99% 97%  Weight:      Height:        Intake/Output Summary (Last 24 hours) at 10/07/2022 1145 Last data filed at 10/07/2022 0745 Gross per 24 hour   Intake 1777.91 ml  Output 925 ml  Net 852.91 ml   Filed Weights   10/02/22 1759 10/03/22 0107 10/04/22 1508  Weight: 71.4 kg 60.8 kg 59.6 kg    Examination:  General exam: NAD.  Frail-appearing Respiratory system: Bibasilar crackles.  Normal work of breathing.  Room air Cardiovascular system: S1-S2, RRR, no murmurs, no pedal edema Gastrointestinal system: Soft, NT/ND, normal bowel sounds Central nervous system: Awake and alert.  Oriented x 1.  No focal deficits Extremities: Decreased bilateral lower extremities Skin: No rashes, lesions or ulcers Psychiatry: Judgement and insight appear impaired. Mood & affect flattened.     Data Reviewed: I have personally reviewed following labs and imaging studies  CBC: Recent Labs  Lab 10/02/22 1430 10/03/22 0313 10/04/22 0745 10/05/22 1009  WBC 6.6 5.4 5.8 5.8  NEUTROABS 5.1  --  4.0 3.6  HGB 9.8* 9.6* 11.7* 11.3*  HCT 30.3* 29.4* 35.1* 33.7*  MCV 98.1 96.1 93.1 93.9  PLT 247 260 298 [id number]   Basic Metabolic Panel: Recent Labs  Lab 10/02/22 1430 10/03/22 0313 10/03/22 1549 10/04/22 0745 10/05/22 1009 10/06/22 1151  NA 138 141 137 135 135 140  K 3.3* 2.5* 3.4* 3.0* 3.4* 3.7  CL 106 104 102 102 102 108  CO2 '22 26 25 26 22 24  '$ GLUCOSE 119* 89 91 102* 99 123*  BUN 14 8 <5* <5* 19 26*  CREATININE 1.15* 0.78 0.62 0.66 1.20* 0.95  CALCIUM 8.6* 8.3* 8.5* 8.5* 8.7* 8.7*  MG 2.0 1.6*  --  2.0  --   --    GFR: Estimated Creatinine Clearance: 43 mL/min (by C-G formula based on SCr of 0.95 mg/dL). Liver Function Tests: Recent Labs  Lab 10/02/22 1430  AST 42*  ALT 13  ALKPHOS 133*  BILITOT 0.9  PROT 6.4*  ALBUMIN 2.9*   No results for input(s): "LIPASE", "AMYLASE" in the last 168 hours. No results for input(s): "AMMONIA" in the last 168 hours. Coagulation Profile: No results for input(s): "INR", "PROTIME" in the last 168 hours. Cardiac Enzymes: No results for input(s): "CKTOTAL", "CKMB", "CKMBINDEX", "TROPONINI" in the  last 168 hours. BNP (last 3 results) No results for input(s): "PROBNP" in the last 8760 hours. HbA1C: No results for input(s): "HGBA1C" in the last 72 hours. CBG: No results for input(s): "GLUCAP" in the last 168 hours. Lipid Profile: No results for input(s): "CHOL", "HDL", "LDLCALC", "TRIG", "CHOLHDL", "LDLDIRECT" in the last 72 hours. Thyroid Function Tests: No results for input(s): "TSH", "T4TOTAL", "FREET4", "T3FREE", "THYROIDAB" in the last 72 hours. Anemia Panel: No results for input(s): "VITAMINB12", "FOLATE", "FERRITIN", "TIBC", "IRON", "RETICCTPCT" in the last 72 hours. Sepsis Labs: Recent Labs  Lab 10/02/22 1430  PROCALCITON <0.10    Recent Results (from the past 240 hour(s))  Resp panel by RT-PCR (RSV, Flu A&B, Covid) Anterior Nasal Swab     Status: Abnormal   Collection Time: 10/02/22  2:30 PM   Specimen: Anterior Nasal Swab  Result Value Ref Range Status   SARS Coronavirus 2 by RT PCR POSITIVE (A) NEGATIVE Final    Comment: (NOTE) SARS-CoV-2 target nucleic acids are DETECTED.  The SARS-CoV-2 RNA is generally detectable in upper respiratory specimens during the acute  phase of infection. Positive results are indicative of the presence of the identified virus, but do not rule out bacterial infection or co-infection with other pathogens not detected by the test. Clinical correlation with patient history and other diagnostic information is necessary to determine patient infection status. The expected result is Negative.  Fact Sheet for Patients: EntrepreneurPulse.com.au  Fact Sheet for Healthcare Providers: IncredibleEmployment.be  This test is not yet approved or cleared by the [place] FDA and  has been authorized for detection and/or diagnosis of SARS-CoV-2 by FDA under an Emergency Use Authorization (EUA).  This EUA will remain in effect (meaning this test can be used) for the duration of  the COVID-19 declaration  under Section 564(b)(1) of the A ct, 21 U.S.C. section 360bbb-3(b)(1), unless the authorization is terminated or revoked sooner.     Influenza A by PCR NEGATIVE NEGATIVE Final   Influenza B by PCR NEGATIVE NEGATIVE Final    Comment: (NOTE) The Xpert Xpress SARS-CoV-2/FLU/RSV plus assay is intended as an aid in the diagnosis of influenza from Nasopharyngeal swab specimens and should not be used as a sole basis for treatment. Nasal washings and aspirates are unacceptable for Xpert Xpress SARS-CoV-2/FLU/RSV testing.  Fact Sheet for Patients: EntrepreneurPulse.com.au  Fact Sheet for Healthcare Providers: IncredibleEmployment.be  This test is not yet approved or cleared by the [place] FDA and has been authorized for detection and/or diagnosis of SARS-CoV-2 by FDA under an Emergency Use Authorization (EUA). This EUA will remain in effect (meaning this test can be used) for the duration of the COVID-19 declaration under Section 564(b)(1) of the Act, 21 U.S.C. section 360bbb-3(b)(1), unless the authorization is terminated or revoked.     Resp Syncytial Virus by PCR NEGATIVE NEGATIVE Final    Comment: (NOTE) Fact Sheet for Patients: EntrepreneurPulse.com.au  Fact Sheet for Healthcare Providers: IncredibleEmployment.be  This test is not yet approved or cleared by the [place] FDA and has been authorized for detection and/or diagnosis of SARS-CoV-2 by FDA under an Emergency Use Authorization (EUA). This EUA will remain in effect (meaning this test can be used) for the duration of the COVID-19 declaration under Section 564(b)(1) of the Act, 21 U.S.C. section 360bbb-3(b)(1), unless the authorization is terminated or revoked.  Performed at [facility], [address] [place], [place] [address]   Culture, blood (routine x 2)     Status: None   Collection Time: 10/02/22  2:30 PM   Specimen:  BLOOD  Result Value Ref Range Status   Specimen Description BLOOD BLOOD RIGHT WRIST  Final   Special Requests   Final    BOTTLES DRAWN AEROBIC AND ANAEROBIC Blood Culture adequate volume   Culture   Final    NO GROWTH 5 DAYS Performed at [facility], [address], [place], [place] [address]    Report Status 10/07/2022 FINAL  Final  Culture, blood (routine x 2)     Status: Abnormal   Collection Time: 10/02/22  2:30 PM   Specimen: BLOOD LEFT HAND  Result Value Ref Range Status   Specimen Description   Final    BLOOD LEFT HAND Performed at [facility], [place] [address] [place], [place] [address]    Special Requests   Final    BOTTLES DRAWN AEROBIC AND ANAEROBIC Blood Culture adequate volume Performed at [facility], [address] [place], [place] [address]    Culture  Setup Time   Final    GRAM POSITIVE COCCI ANAEROBIC BOTTLE  ONLY CRITICAL RESULT CALLED TO, READ BACK BY AND VERIFIED WITH: CARISSA [facility] 10/03/22 1222 MW    Culture (A)  Final    STAPHYLOCOCCUS CAPITIS THE SIGNIFICANCE OF ISOLATING THIS ORGANISM FROM A SINGLE SET OF BLOOD CULTURES WHEN MULTIPLE SETS ARE DRAWN IS UNCERTAIN. PLEASE NOTIFY THE MICROBIOLOGY DEPARTMENT WITHIN ONE WEEK IF SPECIATION AND SENSITIVITIES ARE REQUIRED. Performed at [facility], [place] [address] [place], [place] [address]    Report Status 10/05/2022 FINAL  Final  Blood Culture ID Panel (Reflexed)     Status: Abnormal   Collection Time: 10/02/22  2:30 PM  Result Value Ref Range Status   Enterococcus faecalis NOT DETECTED NOT DETECTED Final   Enterococcus Faecium NOT DETECTED NOT DETECTED Final   Listeria monocytogenes NOT DETECTED NOT DETECTED Final   Staphylococcus species DETECTED (A) NOT DETECTED Final    Comment: CRITICAL RESULT CALLED TO, READ BACK BY AND VERIFIED WITH: CARISSA [facility] 10/03/22 1222 MW    Staphylococcus aureus (BCID) NOT DETECTED NOT DETECTED Final   Staphylococcus epidermidis NOT  DETECTED NOT DETECTED Final   Staphylococcus lugdunensis NOT DETECTED NOT DETECTED Final   Streptococcus species NOT DETECTED NOT DETECTED Final   Streptococcus agalactiae NOT DETECTED NOT DETECTED Final   Streptococcus pneumoniae NOT DETECTED NOT DETECTED Final   Streptococcus pyogenes NOT DETECTED NOT DETECTED Final   A.calcoaceticus-baumannii NOT DETECTED NOT DETECTED Final   Bacteroides fragilis NOT DETECTED NOT DETECTED Final   Enterobacterales NOT DETECTED NOT DETECTED Final   Enterobacter cloacae complex NOT DETECTED NOT DETECTED Final   Escherichia coli NOT DETECTED NOT DETECTED Final   Klebsiella aerogenes NOT DETECTED NOT DETECTED Final   Klebsiella oxytoca NOT DETECTED NOT DETECTED Final   Klebsiella pneumoniae NOT DETECTED NOT DETECTED Final   Proteus species NOT DETECTED NOT DETECTED Final   Salmonella species NOT DETECTED NOT DETECTED Final   Serratia marcescens NOT DETECTED NOT DETECTED Final   Haemophilus influenzae NOT DETECTED NOT DETECTED Final   Neisseria meningitidis NOT DETECTED NOT DETECTED Final   Pseudomonas aeruginosa NOT DETECTED NOT DETECTED Final   Stenotrophomonas maltophilia NOT DETECTED NOT DETECTED Final   Candida albicans NOT DETECTED NOT DETECTED Final   Candida auris NOT DETECTED NOT DETECTED Final   Candida glabrata NOT DETECTED NOT DETECTED Final   Candida krusei NOT DETECTED NOT DETECTED Final   Candida parapsilosis NOT DETECTED NOT DETECTED Final   Candida tropicalis NOT DETECTED NOT DETECTED Final   Cryptococcus neoformans/gattii NOT DETECTED NOT DETECTED Final    Comment: Performed at [facility], [address] [place], [place] [address]  MRSA Next Gen by PCR, Nasal     Status: None   Collection Time: 10/03/22  1:55 AM   Specimen: Nasal Mucosa; Nasal Swab  Result Value Ref Range Status   MRSA by PCR Next Gen NOT DETECTED NOT DETECTED Final    Comment: (NOTE) The GeneXpert MRSA Assay (FDA approved for NASAL specimens only), is  one component of a comprehensive MRSA colonization surveillance program. It is not intended to diagnose MRSA infection nor to guide or monitor treatment for MRSA infections. Test performance is not FDA approved in patients less than 8 years old. Performed at [facility], [address], [place], [place] [address]          Radiology Studies: [facility] Chest [place] 1 View  Result Date: 10/06/2022 CLINICAL DATA:  10026 Shortness of breath 10026 EXAM: PORTABLE CHEST 1 VIEW COMPARISON:  10/02/2022 FINDINGS: The heart size and mediastinal  contours are within normal limits. Aortic atherosclerosis. Low lung volumes with streaky bibasilar opacities. Blunting of the left costophrenic angle. No pneumothorax. The visualized skeletal structures are unremarkable. IMPRESSION: 1. Low lung volumes with streaky bibasilar opacities, which may represent atelectasis versus pneumonia. 2. Possible small left pleural effusion. Electronically Signed   By: Davina Poke D.O.   On: 10/06/2022 09:53        Scheduled Meds:  aspirin  81 mg Oral Daily   Chlorhexidine Gluconate Cloth  6 each Topical Q0600   cloNIDine  0.2 mg Oral Q12H   diltiazem  60 mg Oral Q6H   donepezil  10 mg Oral QHS   DULoxetine  30 mg Oral BID   enoxaparin (LOVENOX) injection  40 mg Subcutaneous Q24H   feeding supplement  237 mL Oral TID BM   ferrous sulfate  325 mg Oral Q breakfast   gabapentin  400 mg Oral BID   multivitamin with minerals  1 tablet Oral Daily   tamsulosin  0.4 mg Oral QHS   Continuous Infusions:     LOS: 5 days      Sidney Ace, MD [facility]   If 7PM-7AM, please contact night-coverage  10/07/2022, 11:45 AM

## 2022-10-07 NOTE — Plan of Care (Signed)
  Problem: Clinical Measurements: Goal: Respiratory complications will improve Outcome: Progressing   Problem: Elimination: Goal: Will not experience complications related to urinary retention Outcome: Progressing   Problem: Safety: Goal: Ability to remain free from injury will improve Outcome: Progressing   Problem: Skin Integrity: Goal: Risk for impaired skin integrity will decrease Outcome: Progressing

## 2022-10-07 NOTE — TOC Progression Note (Signed)
Transition of Care [facility]) - Progression Note    Patient Details  Name: Kristin [name] MRN: [id number] Date of Birth: 01-04-1941  [facility]) CM/SW Contact  Izola Price, RN Phone Number: 10/07/2022, 11:55 AM  Clinical Narrative: 2/24: Peer to Peer completed on 2/23 and ended with HTA requesting another PT evaluations, which was done on 10/06/22.   Discharge recommendation per provider on 10/07/22 remains SNF for safe discharge plan. This was also latest PT evaluation/note. Call in to [facility] at [phone]. Pending return call and next steps on a safe discharge plan. Simmie Davies RN CM            Expected Discharge Plan and Services                                               Social Determinants of Health (SDOH) Interventions SDOH Screenings   Food Insecurity: No Food Insecurity (08/24/2022)  Housing: Low Risk  (08/24/2022)  Transportation Needs: No Transportation Needs (08/24/2022)  Utilities: Not At Risk (08/24/2022)  Alcohol Screen: Low Risk  (09/21/2021)  Depression (PHQ2-9): Low Risk  (01/11/2022)  Financial Resource Strain: Low Risk  (06/13/2021)  Physical Activity: Insufficiently Active (06/13/2021)  Social Connections: Moderately Isolated (06/13/2021)  Stress: No Stress Concern Present (01/11/2022)  Tobacco Use: Low Risk  (08/23/2022)    Readmission Risk Interventions     No data to display

## 2022-10-08 DIAGNOSIS — J9601 Acute respiratory failure with hypoxia: Secondary | ICD-10-CM | POA: Diagnosis not present

## 2022-10-08 NOTE — Progress Notes (Signed)
Patient's RN put in the IV consult. There is no IV medications or fluid orders. Patient's Cr. level went down to normal value (0.95). Informed MD & patient's RN regarding this matter. Dr. Priscella Mann is okay not to put in the PIV access at this time. HS [employer]

## 2022-10-08 NOTE — TOC Progression Note (Addendum)
Transition of Care [facility]) - Progression Note    Patient Details  Name: Kristin [name] MRN: [id number] Date of Birth: Apr 02, 1941  Transition of Care [facility]) CM/SW Contact  Izola Price, RN Phone Number: 10/08/2022, 8:51 AM  Clinical Narrative:  [phone] 2/25: Left another VM for Christinia Gully at [facility] [phone]) to advise of the results of the  requested PT re-evaluation from the  Peer to Peer done on 10/06/22 after initial SNF denial by HTA. PT evaluation still recommending SNF for safe discharge as is the provider per discussion and provider progress notes. PT note also indicates patient is no able to be physically transported by private vehicle as part of evaluation Simmie Davies RN CM    Also left VM  at 940 am with Herma Ard at [phone], a HTA weekend on call number given to me by [facility] CSW. Simmie Davies RN CM         Expected Discharge Plan and Services                                               Social Determinants of Health (SDOH) Interventions SDOH Screenings   Food Insecurity: No Food Insecurity (08/24/2022)  Housing: Low Risk  (08/24/2022)  Transportation Needs: No Transportation Needs (08/24/2022)  Utilities: Not At Risk (08/24/2022)  Alcohol Screen: Low Risk  (09/21/2021)  Depression (PHQ2-9): Low Risk  (01/11/2022)  Financial Resource Strain: Low Risk  (06/13/2021)  Physical Activity: Insufficiently Active (06/13/2021)  Social Connections: Moderately Isolated (06/13/2021)  Stress: No Stress Concern Present (01/11/2022)  Tobacco Use: Low Risk  (08/23/2022)    Readmission Risk Interventions     No data to display

## 2022-10-08 NOTE — Evaluation (Signed)
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 ([place]) 08/27/2015   Cough    no fever    Degeneration of lumbar or lumbosacral intervertebral disc    Dizziness    r/t meds   DVT (deep venous thrombosis) (HCC)    Fibromyalgia    History of shingles    [name] (irritable bowel syndrome)    takes OTC Hardin Negus colon health   Joint pain    Joint swelling    Myalgia and myositis, unspecified    Obstructive sleep apnea (adult) (pediatric)    Old myocardial infarction 1996   Osteoarthritis    Osteoarthrosis, unspecified whether generalized or localized, unspecified site    Psoriatic arthritis ([place])    Rheumatoid arthritis ([place])    Rosacea    Seasonal allergies    Stroke ([place])    ischaemic microvascular disease   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[place])    1991 , bleed and dizziness    Unspecified essential hypertension    takes Diltiazem,Metoprolol,and Losartan daily   UTI (lower urinary tract infection)    frequent=last one was winter of 2014   Past Surgical History:  Past Surgical History:  Procedure Laterality Date   [place]   vasospams; no blockage   CARDIAC CATHETERIZATION  2013   No obstructive coronary artery disease. Mild distal LAD and proximal RCA disease.   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 ;  Surgeon: Izora Gala, MD;  Location: [facility];  Service: ENT;  Laterality: N/A;   NASAL SINUS SURGERY  1998   OOPHORECTOMY     BSO   sinus biopsy  1998   inverted [place]   for mennorhagia-LAVH BSO   HPI:  Per H&P "Kristin [name] is an 82 year old female with history of fibromyalgia, hypertension, peripheral neuropathy, arteriosclerotic dementia with depression, [name], [name], [name], who presents to the emergency department for chief concerns of hypotension and loss of consciousness."    Assessment / Plan / Recommendation  Clinical Impression  Pt seen for clinical swallowing evaluation. Pt alert and pleasant. Easily distracted by environmental stimuli. Encouragement needed for POs. Oriented to self. Cleared with RN who stated nursing has been given pt thickened liquids because "it seems like they go down better." Husband present for education at end of session.   Per chart review, temp and WBC WNL. CXR, 10/06/22, "1. Low lung volumes with streaky bibasilar opacities, which may represent atelectasis versus pneumonia. 2. Possible small left pleural effusion." Pt on room air. Pt seen by  SLP services 08/29/22 with recommendation for a Dysphagia 2 Diet with Thin Liquids.   Pt presents with s/sx mild oral dysphagia c/b oral holding with medication crushed in applesauce as administered by RN. Oral phase was functional with additional trials of pureed (without medication), solid, and thin liquids. Suspect a behavioral component to pt's dysphagia in setting of dementia and acute illness. Pharyngeal swallow appeared [facility] per clinical assessment despite observed secondary swallows with thin liquids.   Discussed diet options with husband, [name]. Husband noted pt with a  dislike for pureed textures at [facility]. Given pt's poor PO intake, recommend a regular consistency diet with thin liquids with the hope that a liberalized diet will promote PO intake. Recommend safe swallowing strategies/aspiration precuations as outlined below.   Pt would benefit from RD consult given concern for pt's ability to meet nutritional needs orally. Noted Palliative Care following.   SLP to sign off as pt has no acute SLP needs at this time.   Pt's husband and RN made aware of results, recommendations, and SLP POC. Pt present; however, suspect reduced understanding given cognitive deficits.  SLP Visit Diagnosis: Dysphagia, oral phase (R13.11)    Aspiration Risk  Mild aspiration risk;Risk for inadequate nutrition/hydration    Diet Recommendation Regular;Thin liquid   Medication Administration: Crushed with puree Supervision: Staff to assist with self feeding;Full supervision/cueing for compensatory strategies Compensations: Minimize environmental distractions;Slow rate;Small sips/bites Postural Changes: Seated upright at 90 degrees;Remain upright for at least 30 minutes after po intake    Other  Recommendations Recommended Consults:  (consider RD consult; Palliative Care following) Oral Care Recommendations: Oral care QID;Staff/trained caregiver to provide oral care    Recommendations for follow up therapy are one component of a multi-disciplinary discharge planning process, led by the attending physician.  Recommendations may be updated based on patient status, additional functional criteria and insurance authorization.  Follow up Recommendations No SLP follow up         Functional Status Assessment Patient has not had a recent decline in their functional status         Prognosis Prognosis for improved oropharyngeal function: Fair Barriers to Reach Goals: Cognitive deficits;Severity of deficits      Swallow Study   General Date of Onset: 10/02/22 HPI: Per H&P "Ms.  Karry [name] is an 82 year old female with history of fibromyalgia, hypertension, peripheral neuropathy, arteriosclerotic dementia with depression, [name], [name], [name], who presents to the emergency department for chief concerns of hypotension and loss of consciousness." Type of Study: Bedside Swallow Evaluation Previous Swallow Assessment: clinical swallowing evaluation 08/29/22 recommended Dysphagia 2 Diet with Thin Liquids Diet Prior to this Study: Regular;Thin liquids (Level 0) Temperature Spikes Noted: No Respiratory Status: Room air History of Recent Intubation: No Behavior/Cognition: Alert;Distractible;Requires cueing Oral Cavity Assessment: Within Functional Limits Oral Care Completed by SLP: Recent completion by staff Vision: Functional for self-feeding Self-Feeding Abilities: Needs assist Patient Positioning: Upright in bed Baseline Vocal Quality: Normal Volitional Cough: Cognitively unable to elicit Volitional Swallow: Unable to elicit    Oral/Motor/Sensory Function Overall Oral Motor/Sensory Function: Within functional limits   Ice Chips Ice chips: Not tested   Thin Liquid Thin Liquid: Impaired Presentation: Straw Oral Phase Impairments:  (WFL) Oral Phase Functional Implications:  (WFL) Pharyngeal  Phase Impairments:  (secondary swallow; however, no overt s/sx pharyngeal dysphagia)    Nectar Thick Nectar Thick Liquid: Not tested   Honey Thick Honey Thick Liquid: Not tested   Puree Puree: Impaired Presentation: Spoon Oral Phase Impairments:  (  oral holding with meds crushed in applesauce as administered by RN) Oral Phase Functional Implications:  (with meds crushed in applesauce as admistered by RN; Darryll Capers without meds) Pharyngeal Phase Impairments:  (WFL)   Solid     Solid: Within functional limits Presentation: Spoon Other Comments: verbal cues/encouragement to accept; however, oral phase was functional; pharyngeal phase appeared [facility] per clinical assessment      Cherrie Gauze, M.S., [facility] [phone] (ASCOM)  Clearnce Sorrel Dallin Mccorkel 10/08/2022,10:31 AM

## 2022-10-08 NOTE — Progress Notes (Signed)
PROGRESS NOTE    Kristin [name]  [id number] DOB: 12/08/1940 DOA: 10/02/2022 PCP: [name], NP    Brief Narrative:  [name], hypertension, peripheral neuropathy, arteriosclerotic dementia with depression, [name], [name], [name], who presents to the emergency department for chief concerns of hypotension and loss of consciousness. Patient tested positive for COVID-19. UA was negative for leukocytes and nitrates. Was said to be hypoxic with room air pulse oximetry of 88% and is currently on 2 L of oxygen.     02/19: Patient is seen and examined at the bedside.  She has no new complaints. 2/21: On room air 2/22: On RA 2/23: Remained stable on room air.  Mobility significantly diminished from baseline.  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  Patient on room air as of 2/21 -Endorsing shortness of breath on 2/23.  Chest x-ray with bibasilar atelectasis.  Attempt incentive spirometry.  If shortness of breath persist can consider some low-dose Lasix challenge. -2/24: Shortness of breath improved.  Patient on room air.  Stress incentive spirometry use   Hypotension - With reported passing out at facility - Presumed secondary to poor p.o. intake and continued antihypertensive medication use in setting of polypharmacy: Patient  is prescribed oxycodone-acetaminophen 5-325 mg, trazodone 50 mg nightly, gabapentin 400 mg p.o. twice daily Improved blood pressure control with IV fluid hydration  AKI Noted on 2/22 Was on fluids.  Now stopped.  Creatinine improved to baseline.   COVID-19 - Airborne and contact precautions Supportive care   Hypokalemia Supplement potassium   Essential hypertension Continue diltiazem.  For now dose has been adjusted to immediate release form as patient was found to be biting on her pill.  Restarted clonidine per home dose.   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HCC) Continue donepezil 10 mg nightly, duloxetine 30 mg p.o. twice daily - Trazodone 25 mg nightly as needed for sleep   Hypomagnesemia Supplement magnesium  Goals of care Poor QOL Poor PO intake Palliative care consult requested Patient remains DNR.  I believe this patient's functional status is reduced from her baseline.  Given the acute insult of the COVID-19 infection I do believe she has recovery potential.  In my opinion the patient will require skilled nursing facility as her next level of care  DVT prophylaxis: SQ Lovenox Code Status: DNR Family Communication: Leonides Grills [phone] on 2/21 Disposition Plan: Status is: Inpatient Remains inpatient appropriate because: Resolving respiratory failure.  Now with AKI.     Level of care: Telemetry Medical  Consultants:  None Procedures:  None  Antimicrobials: None   Subjective: Seen and examined.  More awake this morning.  No complaints  Objective: Vitals:   10/07/22 2154 10/08/22 0131 10/08/22 0516 10/08/22 0827  BP: (!) 156/124 (!) 111/55 (!) 135/51 (!) 168/54  Pulse:   70 77  Resp:   16 18  Temp:   98.5 F (36.9  C) 98.7 F (37.1 C)  TempSrc:   Oral Oral  SpO2:   98% 98%  Weight:      Height:        Intake/Output Summary (Last 24 hours) at 10/08/2022 1021 Last data filed at 10/08/2022 0528 Gross per 24 hour  Intake 100 ml   Output 1125 ml  Net -1025 ml   Filed Weights   10/02/22 1759 10/03/22 0107 10/04/22 1508  Weight: 71.4 kg 60.8 kg 59.6 kg    Examination:  General exam: NAD.  Appears frail Respiratory system: Bibasilar crackles.  Normal work of breathing.  Room air Cardiovascular system: S1-S2, RRR, no murmurs, no pedal edema Gastrointestinal system: Soft, NT/ND, normal bowel sounds Central nervous system: Awake and alert.  Oriented x 1.  No focal deficits Extremities: Decreased bilateral lower extremities Skin: No rashes, lesions or ulcers Psychiatry: Judgement and insight appear impaired. Mood & affect flattened.     Data Reviewed: I have personally reviewed following labs and imaging studies  CBC: Recent Labs  Lab 10/02/22 1430 10/03/22 0313 10/04/22 0745 10/05/22 1009  WBC 6.6 5.4 5.8 5.8  NEUTROABS 5.1  --  4.0 3.6  HGB 9.8* 9.6* 11.7* 11.3*  HCT 30.3* 29.4* 35.1* 33.7*  MCV 98.1 96.1 93.1 93.9  PLT 247 260 298 [id number]   Basic Metabolic Panel: Recent Labs  Lab 10/02/22 1430 10/03/22 0313 10/03/22 1549 10/04/22 0745 10/05/22 1009 10/06/22 1151  NA 138 141 137 135 135 140  K 3.3* 2.5* 3.4* 3.0* 3.4* 3.7  CL 106 104 102 102 102 108  CO2 '22 26 25 26 22 24  '$ GLUCOSE 119* 89 91 102* 99 123*  BUN 14 8 <5* <5* 19 26*  CREATININE 1.15* 0.78 0.62 0.66 1.20* 0.95  CALCIUM 8.6* 8.3* 8.5* 8.5* 8.7* 8.7*  MG 2.0 1.6*  --  2.0  --   --    GFR: Estimated Creatinine Clearance: 43 mL/min (by C-G formula based on SCr of 0.95 mg/dL). Liver Function Tests: Recent Labs  Lab 10/02/22 1430  AST 42*  ALT 13  ALKPHOS 133*  BILITOT 0.9  PROT 6.4*  ALBUMIN 2.9*   No results for input(s): "LIPASE", "AMYLASE" in the last 168 hours. No results for input(s): "AMMONIA" in the last 168 hours. Coagulation Profile: No results for input(s): "INR", "PROTIME" in the last 168 hours. Cardiac Enzymes: No results for input(s): "CKTOTAL", "CKMB", "CKMBINDEX", "TROPONINI" in the last 168 hours. BNP  (last 3 results) No results for input(s): "PROBNP" in the last 8760 hours. HbA1C: No results for input(s): "HGBA1C" in the last 72 hours. CBG: No results for input(s): "GLUCAP" in the last 168 hours. Lipid Profile: No results for input(s): "CHOL", "HDL", "LDLCALC", "TRIG", "CHOLHDL", "LDLDIRECT" in the last 72 hours. Thyroid Function Tests: No results for input(s): "TSH", "T4TOTAL", "FREET4", "T3FREE", "THYROIDAB" in the last 72 hours. Anemia Panel: No results for input(s): "VITAMINB12", "FOLATE", "FERRITIN", "TIBC", "IRON", "RETICCTPCT" in the last 72 hours. Sepsis Labs: Recent Labs  Lab 10/02/22 1430  PROCALCITON <0.10    Recent Results (from the past 240 hour(s))  Resp panel by RT-PCR (RSV, Flu A&B, Covid) Anterior Nasal Swab     Status: Abnormal   Collection Time: 10/02/22  2:30 PM   Specimen: Anterior Nasal Swab  Result Value Ref Range Status   SARS Coronavirus 2 by RT PCR POSITIVE (A) NEGATIVE Final    Comment: (NOTE) SARS-CoV-2 target nucleic acids are DETECTED.  The SARS-CoV-2 RNA is generally detectable in upper respiratory specimens during  the acute phase of infection. Positive results are indicative of the presence of the identified virus, but do not rule out bacterial infection or co-infection with other pathogens not detected by the test. Clinical correlation with patient history and other diagnostic information is necessary to determine patient infection status. The expected result is Negative.  Fact Sheet for Patients: EntrepreneurPulse.com.au  Fact Sheet for Healthcare Providers: IncredibleEmployment.be  This test is not yet approved or cleared by the [place] FDA and  has been authorized for detection and/or diagnosis of SARS-CoV-2 by FDA under an Emergency Use Authorization (EUA).  This EUA will remain in effect (meaning this test can be used) for the duration of  the COVID-19 declaration under Section 564(b)(1)  of the A ct, 21 U.S.C. section 360bbb-3(b)(1), unless the authorization is terminated or revoked sooner.     Influenza A by PCR NEGATIVE NEGATIVE Final   Influenza B by PCR NEGATIVE NEGATIVE Final    Comment: (NOTE) The Xpert Xpress SARS-CoV-2/FLU/RSV plus assay is intended as an aid in the diagnosis of influenza from Nasopharyngeal swab specimens and should not be used as a sole basis for treatment. Nasal washings and aspirates are unacceptable for Xpert Xpress SARS-CoV-2/FLU/RSV testing.  Fact Sheet for Patients: EntrepreneurPulse.com.au  Fact Sheet for Healthcare Providers: IncredibleEmployment.be  This test is not yet approved or cleared by the [place] FDA and has been authorized for detection and/or diagnosis of SARS-CoV-2 by FDA under an Emergency Use Authorization (EUA). This EUA will remain in effect (meaning this test can be used) for the duration of the COVID-19 declaration under Section 564(b)(1) of the Act, 21 U.S.C. section 360bbb-3(b)(1), unless the authorization is terminated or revoked.     Resp Syncytial Virus by PCR NEGATIVE NEGATIVE Final    Comment: (NOTE) Fact Sheet for Patients: EntrepreneurPulse.com.au  Fact Sheet for Healthcare Providers: IncredibleEmployment.be  This test is not yet approved or cleared by the [place] FDA and has been authorized for detection and/or diagnosis of SARS-CoV-2 by FDA under an Emergency Use Authorization (EUA). This EUA will remain in effect (meaning this test can be used) for the duration of the COVID-19 declaration under Section 564(b)(1) of the Act, 21 U.S.C. section 360bbb-3(b)(1), unless the authorization is terminated or revoked.  Performed at [facility], [address] [place], [place] [address]   Culture, blood (routine x 2)     Status: None   Collection Time: 10/02/22  2:30 PM   Specimen: BLOOD  Result Value Ref  Range Status   Specimen Description BLOOD BLOOD RIGHT WRIST  Final   Special Requests   Final    BOTTLES DRAWN AEROBIC AND ANAEROBIC Blood Culture adequate volume   Culture   Final    NO GROWTH 5 DAYS Performed at [facility], [address] [place], [place] [address]    Report Status 10/07/2022 FINAL  Final  Culture, blood (routine x 2)     Status: Abnormal   Collection Time: 10/02/22  2:30 PM   Specimen: BLOOD LEFT HAND  Result Value Ref Range Status   Specimen Description   Final    BLOOD LEFT HAND Performed at [facility], [place] [address] [place], [place] [address]    Special Requests   Final    BOTTLES DRAWN AEROBIC AND ANAEROBIC Blood Culture adequate volume Performed at [facility], [address] [place], [place] [address]    Culture  Setup Time   Final    [facility] POSITIVE COCCI  ANAEROBIC BOTTLE ONLY CRITICAL RESULT CALLED TO, READ BACK BY AND VERIFIED WITH: CARISSA [facility] 10/03/22 1222 MW    Culture (A)  Final    STAPHYLOCOCCUS CAPITIS THE SIGNIFICANCE OF ISOLATING THIS ORGANISM FROM A SINGLE SET OF BLOOD CULTURES WHEN MULTIPLE SETS ARE DRAWN IS UNCERTAIN. PLEASE NOTIFY THE MICROBIOLOGY DEPARTMENT WITHIN ONE WEEK IF SPECIATION AND SENSITIVITIES ARE REQUIRED. Performed at [facility], [place] [address] [place], [place] [address]    Report Status 10/05/2022 FINAL  Final  Blood Culture ID Panel (Reflexed)     Status: Abnormal   Collection Time: 10/02/22  2:30 PM  Result Value Ref Range Status   Enterococcus faecalis NOT DETECTED NOT DETECTED Final   Enterococcus Faecium NOT DETECTED NOT DETECTED Final   Listeria monocytogenes NOT DETECTED NOT DETECTED Final   Staphylococcus species DETECTED (A) NOT DETECTED Final    Comment: CRITICAL RESULT CALLED TO, READ BACK BY AND VERIFIED WITH: CARISSA [facility] 10/03/22 1222 MW    Staphylococcus aureus (BCID) NOT DETECTED NOT DETECTED Final   Staphylococcus epidermidis NOT DETECTED NOT DETECTED Final    Staphylococcus lugdunensis NOT DETECTED NOT DETECTED Final   Streptococcus species NOT DETECTED NOT DETECTED Final   Streptococcus agalactiae NOT DETECTED NOT DETECTED Final   Streptococcus pneumoniae NOT DETECTED NOT DETECTED Final   Streptococcus pyogenes NOT DETECTED NOT DETECTED Final   A.calcoaceticus-baumannii NOT DETECTED NOT DETECTED Final   Bacteroides fragilis NOT DETECTED NOT DETECTED Final   Enterobacterales NOT DETECTED NOT DETECTED Final   Enterobacter cloacae complex NOT DETECTED NOT DETECTED Final   Escherichia coli NOT DETECTED NOT DETECTED Final   Klebsiella aerogenes NOT DETECTED NOT DETECTED Final   Klebsiella oxytoca NOT DETECTED NOT DETECTED Final   Klebsiella pneumoniae NOT DETECTED NOT DETECTED Final   Proteus species NOT DETECTED NOT DETECTED Final   Salmonella species NOT DETECTED NOT DETECTED Final   Serratia marcescens NOT DETECTED NOT DETECTED Final   Haemophilus influenzae NOT DETECTED NOT DETECTED Final   Neisseria meningitidis NOT DETECTED NOT DETECTED Final   Pseudomonas aeruginosa NOT DETECTED NOT DETECTED Final   Stenotrophomonas maltophilia NOT DETECTED NOT DETECTED Final   Candida albicans NOT DETECTED NOT DETECTED Final   Candida auris NOT DETECTED NOT DETECTED Final   Candida glabrata NOT DETECTED NOT DETECTED Final   Candida krusei NOT DETECTED NOT DETECTED Final   Candida parapsilosis NOT DETECTED NOT DETECTED Final   Candida tropicalis NOT DETECTED NOT DETECTED Final   Cryptococcus neoformans/gattii NOT DETECTED NOT DETECTED Final    Comment: Performed at [facility], [address] [place], [place] [address]  MRSA Next Gen by PCR, Nasal     Status: None   Collection Time: 10/03/22  1:55 AM   Specimen: Nasal Mucosa; Nasal Swab  Result Value Ref Range Status   MRSA by PCR Next Gen NOT DETECTED NOT DETECTED Final    Comment: (NOTE) The GeneXpert MRSA Assay (FDA approved for NASAL specimens only), is one component of a  comprehensive MRSA colonization surveillance program. It is not intended to diagnose MRSA infection nor to guide or monitor treatment for MRSA infections. Test performance is not FDA approved in patients less than 62 years old. Performed at [facility], [address] [place],  [address]          Radiology Studies: No results found.      Scheduled Meds:  aspirin  81 mg Oral Daily   Chlorhexidine Gluconate Cloth  6 each Topical Q0600  cloNIDine  0.2 mg Oral Q12H   diltiazem  60 mg Oral Q6H   donepezil  10 mg Oral QHS   DULoxetine  30 mg Oral BID   enoxaparin (LOVENOX) injection  40 mg Subcutaneous Q24H   feeding supplement  237 mL Oral TID BM   ferrous sulfate  325 mg Oral Q breakfast   gabapentin  400 mg Oral BID   multivitamin with minerals  1 tablet Oral Daily   tamsulosin  0.4 mg Oral QHS   Continuous Infusions:     LOS: 6 days      Sidney Ace, MD [facility]   If 7PM-7AM, please contact night-coverage  10/08/2022, 10:21 AM

## 2022-10-08 NOTE — TOC Progression Note (Signed)
Transition of Care [facility]) - Progression Note    Patient Details  Name: Kristin [name] MRN: [id number] Date of Birth: [birth year]  [facility] CM/SW Contact  Izola Price, RN Phone Number: 10/08/2022, 1:55 PM  Clinical Narrative: 2/25: Received notification of weekday RN CM that [employer] had left a VM on their work phone today that patient had been approved for SNF for initial first 7 days, good for 5 days. Checked with facility and they can take her Monday am. Updated provider and will plan to see patient in am again and plan for discharge if condition remains the same. Apolonio Schneiders [phone] per Herma Ard. Simmie Davies RN CM            Expected Discharge Plan and Services                                               Social Determinants of Health (SDOH) Interventions SDOH Screenings   Food Insecurity: No Food Insecurity (08/24/2022)  Housing: Low Risk  (08/24/2022)  Transportation Needs: No Transportation Needs (08/24/2022)  Utilities: Not At Risk (08/24/2022)  Alcohol Screen: Low Risk  (09/21/2021)  Depression (PHQ2-9): Low Risk  (01/11/2022)  Financial Resource Strain: Low Risk  (06/13/2021)  Physical Activity: Insufficiently Active (06/13/2021)  Social Connections: Moderately Isolated (06/13/2021)  Stress: No Stress Concern Present (01/11/2022)  Tobacco Use: Low Risk  (08/23/2022)    Readmission Risk Interventions     No data to display

## 2022-10-09 ENCOUNTER — Telehealth: Payer: PPO

## 2022-10-09 DIAGNOSIS — R41841 Cognitive communication deficit: Secondary | ICD-10-CM | POA: Diagnosis not present

## 2022-10-09 DIAGNOSIS — J9601 Acute respiratory failure with hypoxia: Secondary | ICD-10-CM | POA: Diagnosis not present

## 2022-10-09 DIAGNOSIS — N183 Chronic kidney disease, stage 3 unspecified: Secondary | ICD-10-CM | POA: Diagnosis not present

## 2022-10-09 DIAGNOSIS — Z7401 Bed confinement status: Secondary | ICD-10-CM | POA: Diagnosis not present

## 2022-10-09 DIAGNOSIS — E785 Hyperlipidemia, unspecified: Secondary | ICD-10-CM | POA: Diagnosis not present

## 2022-10-09 DIAGNOSIS — F419 Anxiety disorder, unspecified: Secondary | ICD-10-CM | POA: Diagnosis not present

## 2022-10-09 DIAGNOSIS — F331 Major depressive disorder, recurrent, moderate: Secondary | ICD-10-CM | POA: Diagnosis not present

## 2022-10-09 DIAGNOSIS — S32810D Multiple fractures of pelvis with stable disruption of pelvic ring, subsequent encounter for fracture with routine healing: Secondary | ICD-10-CM | POA: Diagnosis not present

## 2022-10-09 DIAGNOSIS — L89620 Pressure ulcer of left heel, unstageable: Secondary | ICD-10-CM | POA: Diagnosis not present

## 2022-10-09 DIAGNOSIS — Z79899 Other long term (current) drug therapy: Secondary | ICD-10-CM | POA: Diagnosis not present

## 2022-10-09 DIAGNOSIS — R110 Nausea: Secondary | ICD-10-CM | POA: Diagnosis not present

## 2022-10-09 DIAGNOSIS — G4700 Insomnia, unspecified: Secondary | ICD-10-CM | POA: Diagnosis not present

## 2022-10-09 DIAGNOSIS — I252 Old myocardial infarction: Secondary | ICD-10-CM | POA: Diagnosis not present

## 2022-10-09 DIAGNOSIS — D649 Anemia, unspecified: Secondary | ICD-10-CM | POA: Diagnosis not present

## 2022-10-09 DIAGNOSIS — R55 Syncope and collapse: Secondary | ICD-10-CM | POA: Diagnosis not present

## 2022-10-09 DIAGNOSIS — I25111 Atherosclerotic heart disease of native coronary artery with angina pectoris with documented spasm: Secondary | ICD-10-CM | POA: Diagnosis not present

## 2022-10-09 DIAGNOSIS — R634 Abnormal weight loss: Secondary | ICD-10-CM | POA: Diagnosis not present

## 2022-10-09 DIAGNOSIS — F5105 Insomnia due to other mental disorder: Secondary | ICD-10-CM | POA: Diagnosis not present

## 2022-10-09 DIAGNOSIS — E875 Hyperkalemia: Secondary | ICD-10-CM | POA: Diagnosis not present

## 2022-10-09 DIAGNOSIS — R5381 Other malaise: Secondary | ICD-10-CM | POA: Diagnosis not present

## 2022-10-09 DIAGNOSIS — I10 Essential (primary) hypertension: Secondary | ICD-10-CM | POA: Diagnosis not present

## 2022-10-09 DIAGNOSIS — R2689 Other abnormalities of gait and mobility: Secondary | ICD-10-CM | POA: Diagnosis not present

## 2022-10-09 DIAGNOSIS — J9600 Acute respiratory failure, unspecified whether with hypoxia or hypercapnia: Secondary | ICD-10-CM | POA: Diagnosis not present

## 2022-10-09 DIAGNOSIS — U071 COVID-19: Secondary | ICD-10-CM | POA: Diagnosis not present

## 2022-10-09 DIAGNOSIS — R1312 Dysphagia, oropharyngeal phase: Secondary | ICD-10-CM | POA: Diagnosis not present

## 2022-10-09 DIAGNOSIS — M797 Fibromyalgia: Secondary | ICD-10-CM | POA: Diagnosis not present

## 2022-10-09 DIAGNOSIS — M6281 Muscle weakness (generalized): Secondary | ICD-10-CM | POA: Diagnosis not present

## 2022-10-09 DIAGNOSIS — E871 Hypo-osmolality and hyponatremia: Secondary | ICD-10-CM | POA: Diagnosis not present

## 2022-10-09 DIAGNOSIS — Z8673 Personal history of transient ischemic attack (TIA), and cerebral infarction without residual deficits: Secondary | ICD-10-CM | POA: Diagnosis not present

## 2022-10-09 DIAGNOSIS — G4733 Obstructive sleep apnea (adult) (pediatric): Secondary | ICD-10-CM | POA: Diagnosis not present

## 2022-10-09 DIAGNOSIS — G629 Polyneuropathy, unspecified: Secondary | ICD-10-CM | POA: Diagnosis not present

## 2022-10-09 DIAGNOSIS — F0153 Vascular dementia, unspecified severity, with mood disturbance: Secondary | ICD-10-CM | POA: Diagnosis not present

## 2022-10-09 DIAGNOSIS — F0150 Vascular dementia without behavioral disturbance: Secondary | ICD-10-CM | POA: Diagnosis not present

## 2022-10-09 DIAGNOSIS — L89616 Pressure-induced deep tissue damage of right heel: Secondary | ICD-10-CM | POA: Diagnosis not present

## 2022-10-09 DIAGNOSIS — E872 Acidosis, unspecified: Secondary | ICD-10-CM | POA: Diagnosis not present

## 2022-10-09 DIAGNOSIS — M8580 Other specified disorders of bone density and structure, unspecified site: Secondary | ICD-10-CM | POA: Diagnosis not present

## 2022-10-09 DIAGNOSIS — R0902 Hypoxemia: Secondary | ICD-10-CM | POA: Diagnosis not present

## 2022-10-09 DIAGNOSIS — E876 Hypokalemia: Secondary | ICD-10-CM | POA: Diagnosis not present

## 2022-10-09 DIAGNOSIS — M1990 Unspecified osteoarthritis, unspecified site: Secondary | ICD-10-CM | POA: Diagnosis not present

## 2022-10-09 DIAGNOSIS — S32591D Other specified fracture of right pubis, subsequent encounter for fracture with routine healing: Secondary | ICD-10-CM | POA: Diagnosis not present

## 2022-10-09 DIAGNOSIS — I959 Hypotension, unspecified: Secondary | ICD-10-CM | POA: Diagnosis not present

## 2022-10-09 DIAGNOSIS — W19XXXA Unspecified fall, initial encounter: Secondary | ICD-10-CM | POA: Diagnosis not present

## 2022-10-09 DIAGNOSIS — N390 Urinary tract infection, site not specified: Secondary | ICD-10-CM | POA: Diagnosis not present

## 2022-10-09 DIAGNOSIS — R339 Retention of urine, unspecified: Secondary | ICD-10-CM | POA: Diagnosis not present

## 2022-10-09 DIAGNOSIS — Z515 Encounter for palliative care: Secondary | ICD-10-CM | POA: Diagnosis not present

## 2022-10-09 LAB — CBC WITH DIFFERENTIAL/PLATELET
Abs Immature Granulocytes: 0.04 10*3/uL (ref 0.00–0.07)
Basophils Absolute: 0.1 10*3/uL (ref 0.0–0.1)
Basophils Relative: 1 %
Eosinophils Absolute: 0.2 10*3/uL (ref 0.0–0.5)
Eosinophils Relative: 2 %
HCT: 31.5 % — ABNORMAL LOW (ref 36.0–46.0)
Hemoglobin: 10.4 g/dL — ABNORMAL LOW (ref 12.0–15.0)
Immature Granulocytes: 1 %
Lymphocytes Relative: 12 %
Lymphs Abs: 0.9 10*3/uL (ref 0.7–4.0)
MCH: 31.3 pg (ref 26.0–34.0)
MCHC: 33 g/dL (ref 30.0–36.0)
MCV: 94.9 fL (ref 80.0–100.0)
Monocytes Absolute: 0.6 10*3/uL (ref 0.1–1.0)
Monocytes Relative: 8 %
Neutro Abs: 5.9 10*3/uL (ref 1.7–7.7)
Neutrophils Relative %: 76 %
Platelets: 323 10*3/uL (ref 150–400)
RBC: 3.32 MIL/uL — ABNORMAL LOW (ref 3.87–5.11)
RDW: 14.6 % (ref 11.5–15.5)
WBC: 7.6 10*3/uL (ref 4.0–10.5)
nRBC: 0 % (ref 0.0–0.2)

## 2022-10-09 LAB — BASIC METABOLIC PANEL
Anion gap: 8 (ref 5–15)
BUN: 14 mg/dL (ref 8–23)
CO2: 25 mmol/L (ref 22–32)
Calcium: 8.6 mg/dL — ABNORMAL LOW (ref 8.9–10.3)
Chloride: 106 mmol/L (ref 98–111)
Creatinine, Ser: 0.66 mg/dL (ref 0.44–1.00)
GFR, Estimated: >60 mL/min (ref >60)
Glucose, Bld: 104 mg/dL — ABNORMAL HIGH (ref 70–99)
Potassium: 3.3 mmol/L — ABNORMAL LOW (ref 3.5–5.1)
Sodium: 139 mmol/L (ref 135–145)

## 2022-10-09 MED ORDER — OXYCODONE HCL 5 MG PO TABS: 5.0000 mg | ORAL_TABLET | Freq: Four times a day (QID) | ORAL | 0 refills | Status: AC | PRN

## 2022-10-09 NOTE — Discharge Summary (Signed)
Physician Discharge Summary  Kristin [name] [id number] DOB: 05-30-1941 DOA: 10/02/2022  PCP: [name], NP  Admit date: 10/02/2022 Discharge date: 10/09/2022  Admitted From: SNF Disposition:  SNF  Recommendations for Outpatient Follow-up:  Follow up with PCP in 1-2 weeks   Home Health:No Equipment/Devices:None   Discharge Condition:Stable  CODE STATUS:DNR  Diet recommendation: Reg  Brief/Interim Summary: [name], hypertension, peripheral neuropathy, arteriosclerotic dementia with depression, [name], [name], [name], who presents to the emergency department for chief concerns of hypotension and loss of consciousness. Patient tested positive for COVID-19. UA was negative for leukocytes and nitrates. Was said to be hypoxic with room air pulse oximetry of 88% and is currently on 2 L of oxygen.     02/19: Patient is seen and examined at the bedside.  She has no new complaints. 2/21: On room air 2/22: On RA 2/23: Remained stable on room air.  Mobility significantly diminished from baseline.  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  Patient on room air as of 2/21 -Endorsing shortness of breath on 2/23.  Chest x-ray with bibasilar atelectasis.  Attempt incentive spirometry.  If shortness of  breath persist can consider some low-dose Lasix challenge. -2/24: Shortness of breath improved.  Patient on room air.  Stress incentive spirometry use - 2/26: patient remains on RA   Hypotension - With reported passing out at facility - Presumed secondary to poor p.o. intake and continued antihypertensive medication use in setting of polypharmacy: Patient is prescribed oxycodone-acetaminophen 5-325 mg, trazodone 50 mg nightly, gabapentin 400 mg p.o. twice daily Improved blood pressure control with IV fluid hydration - Can restart home regimen on dc.  Please discuss with outpatient providers   AKI Noted on 2/22 Was on fluids.  Now stopped.  Creatinine improved to baseline.   COVID-19 - Airborne and contact precautions Supportive care     Essential hypertension Continue diltiazem.  For now dose has been adjusted to immediate release form as patient was found to be biting on her pill.  Restarted clonidine per home dose.  Restart cardizem CD on dc   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HCC) Continue donepezil 10 mg nightly, duloxetine 30 mg p.o. twice daily     Discharge Instructions  Discharge Instructions     Diet - low sodium heart healthy   Complete by: As directed    Discharge wound care:   Complete by: As directed    Pressure Injury Heel Left;Posterior Unstageable - Full thickness tissue loss in which the base of the injury is covered by slough (yellow, tan, gray, green or brown) and/or eschar (tan, brown or black) in the wound bed. -- days   Pressure Injury 10/03/22 Heel Right;Posterior Unstageable - Full thickness tissue loss in which the base of the injury is covered by slough (yellow, tan, gray, green or brown) and/or eschar (tan, brown  or black) in the wound bed.   Increase activity slowly   Complete by: As directed       Allergies as of 10/09/2022       Reactions   Angiotensin Receptor Blockers Anaphylaxis   Fatigue & Dizziness   Other Anaphylaxis   Other  reaction(s): Other (See Comments) Dizziness, rash Fatigue & Dizziness   Hydralazine Other (See Comments)   Unknown   Latex    REACTION: Rash, blisters, breathing probs.   Losartan    Dizziness   Methotrexate Derivatives    Dizziness, rash   Spironolactone    Dizziness   Penicillins Hives, Rash        Medication List     STOP taking these medications    ciprofloxacin 500 MG tablet Commonly known as: CIPRO   oxyCODONE-acetaminophen 5-325 MG tablet Commonly known as: PERCOCET/ROXICET       TAKE these medications    acetaminophen 325 MG tablet Commonly known as: TYLENOL Take 975 mg by mouth 3 (three) times daily.   aspirin 81 MG chewable tablet Chew 81 mg by mouth daily.   CALTRATE 600+D PO Take 1 tablet by mouth daily at 12 noon.   cloNIDine 0.2 MG tablet Commonly known as: CATAPRES Take 1 tablet (0.2 mg total) by mouth 3 (three) times daily. What changed: when to take this   Cranberry 400 MG Tabs Take 1 tablet by mouth daily.   CULTURELLE PO Take 1 capsule by mouth daily.   diclofenac Sodium 1 % Gel Commonly known as: VOLTAREN Apply 2 g topically 4 (four) times daily as needed.   diltiazem 240 MG 24 hr capsule Commonly known as: CARDIZEM CD TAKE 1 CAPSULE BY MOUTH DAILY   donepezil 10 MG tablet Commonly known as: ARICEPT Take 10 mg by mouth at bedtime.   DULoxetine 30 MG capsule Commonly known as: CYMBALTA Take 1 capsule (30 mg total) by mouth 2 (two) times daily.   feeding supplement Liqd Take 237 mLs by mouth 3 (three) times daily between meals.   ferrous sulfate 325 (65 FE) MG tablet Take 325 mg by mouth daily with breakfast.   Fish Oil 1200 MG Caps Take 1 capsule by mouth daily.   gabapentin 400 MG capsule Commonly known as: NEURONTIN Take 400 mg by mouth 2 (two) times daily.   Glycerin-Hypromellose-PEG 400 0.2-0.2-1 % Soln Apply to eye.   memantine 10 MG tablet Commonly known as: NAMENDA TAKE 1 TABLET BY MOUTH TWICE DAILY    multivitamin tablet Take 1 tablet by mouth daily.   oxyCODONE 5 MG immediate release tablet Commonly known as: Oxy IR/ROXICODONE Take 1 tablet (5 mg total) by mouth every 6 (six) hours as needed for up to 3 days for severe pain. Snf use only What changed: additional instructions   tamsulosin 0.4 MG Caps capsule Commonly known as: FLOMAX Take 0.4 mg by mouth at bedtime.   traZODone 50 MG tablet Commonly known as: DESYREL Take 0.5 tablets (25 mg total) by mouth at bedtime as needed for sleep.               Discharge Care Instructions  (From admission, onward)           Start     Ordered   10/09/22 0000  Discharge wound care:       Comments: Pressure Injury Heel Left;Posterior Unstageable - Full thickness tissue loss in which the base of the injury is covered by slough (yellow, tan, gray, green or brown) and/or  eschar (tan, brown or black) in the wound bed. -- days   Pressure Injury 10/03/22 Heel Right;Posterior Unstageable - Full thickness tissue loss in which the base of the injury is covered by slough (yellow, tan, gray, green or brown) and/or eschar (tan, brown or black) in the wound bed.   10/09/22 1001            Contact information for after-discharge care     [facility] .   Service: Skilled Nursing Contact information: [place] [phone]                    Allergies  Allergen Reactions   Angiotensin Receptor Blockers Anaphylaxis    Fatigue & Dizziness   Other Anaphylaxis    Other reaction(s): Other (See Comments) Dizziness, rash Fatigue & Dizziness   Hydralazine Other (See Comments)    Unknown   Latex     REACTION: Rash, blisters, breathing probs.   Losartan     Dizziness    Methotrexate Derivatives     Dizziness, rash    Spironolactone     Dizziness    Penicillins Hives and Rash    Consultations: Palliative  care   Procedures/Studies: DG Chest Port 1 View  Result Date: 10/06/2022 CLINICAL DATA:  10026 Shortness of breath 10026 EXAM: PORTABLE CHEST 1 VIEW COMPARISON:  10/02/2022 FINDINGS: The heart size and mediastinal contours are within normal limits. Aortic atherosclerosis. Low lung volumes with streaky bibasilar opacities. Blunting of the left costophrenic angle. No pneumothorax. The visualized skeletal structures are unremarkable. IMPRESSION: 1. Low lung volumes with streaky bibasilar opacities, which may represent atelectasis versus pneumonia. 2. Possible small left pleural effusion. Electronically Signed   By: Davina Poke D.O.   On: 10/06/2022 09:53   CT Angio Chest PE W/Cm &/Or Wo Cm  Result Date: 10/02/2022 CLINICAL DATA:  COVID hypotensive EXAM: CT ANGIOGRAPHY CHEST WITH CONTRAST TECHNIQUE: Multidetector CT imaging of the chest was performed using the standard protocol during bolus administration of intravenous contrast. Multiplanar CT image reconstructions and MIPs were obtained to evaluate the vascular anatomy. RADIATION DOSE REDUCTION: This exam was performed according to the departmental dose-optimization program which includes automated exposure control, adjustment of the mA and/or kV according to patient size and/or use of iterative reconstruction technique. CONTRAST:  66m OMNIPAQUE IOHEXOL 350 MG/ML SOLN COMPARISON:  Chest x-ray 10/02/2022 FINDINGS: Cardiovascular: Satisfactory opacification of the pulmonary arteries to the segmental level. No evidence of pulmonary embolism. Normal heart size. No significant pericardial effusion. Nonaneurysmal aorta. No dissection is seen. Moderate atherosclerosis. Coronary vascular calcification. Mediastinum/Nodes: Midline trachea. No thyroid mass. No suspicious lymph nodes. Esophagus within normal limits. Lungs/Pleura: Trace pleural effusions and mild dependent atelectasis. No acute airspace disease. Upper Abdomen: No acute finding. Multiple hepatic  cysts with dominant 12.6 by 10.1 by 11.2 cm right hepatic cyst. Indeterminate hypodense mass at the left hepatic lobe inferior to the falciform ligament region, this measures 2.4 by 2.1 cm. Some peripheral calcification. Musculoskeletal: No chest wall abnormality. No acute or significant osseous findings. Review of the MIP images confirms the above findings. IMPRESSION: 1. Negative for acute pulmonary embolus or aortic dissection. Trace pleural effusions with mild dependent atelectasis. No acute airspace disease. 2. Indeterminate 2.4 cm hypodense mass in the left hepatic lobe. When the patient is clinically stable and able to follow directions and hold their breath (preferably as an outpatient) further evaluation with dedicated abdominal MRI should  be considered. Aortic Atherosclerosis (ICD10-I70.0). Electronically Signed   By: Donavan Foil M.D.   On: 10/02/2022 17:21   DG Chest 1 View  Result Date: 10/02/2022 CLINICAL DATA:  Shortness of breath in a female at age 69. EXAM: CHEST  1 VIEW COMPARISON:  Comparison made with August 23, 2022 FINDINGS: Cardiomediastinal contours and hilar structures are stable with mild cardiac enlargement and central pulmonary vascular engorgement suggested. No lobar level consolidation. Mild elevation of the RIGHT hemidiaphragm. Mild increase in retrocardiac airspace disease mainly with linear appearance. Mild blunting of LEFT costodiaphragmatic sulcus. No pneumothorax. On limited assessment no acute skeletal process. IMPRESSION: 1. Mild cardiac enlargement and central pulmonary vascular engorgement. 2. Mild increase in retrocardiac airspace disease mainly with linear appearance, potentially atelectasis though possibly so seated with small LEFT-sided pleural effusion. Seen in the LEFT lung base and LEFT costodiaphragmatic sulcus. Electronically Signed   By: Zetta Bills M.D.   On: 10/02/2022 13:21      Subjective: Seen and examined on day of dc.  Husband at bedside.   Appropriate for dc to SNF  Discharge Exam: Vitals:   10/09/22 0448 10/09/22 0802  BP: (!) 129/57 (!) 148/53  Pulse: 75 78  Resp: 20 17  Temp: 97.8 F (36.6 C) 98 F (36.7 C)  SpO2: 98% 98%   Vitals:   10/08/22 2003 10/08/22 2350 10/09/22 0448 10/09/22 0802  BP: (!) 165/54 (!) 141/53 (!) 129/57 (!) 148/53  Pulse: 80 74 75 78  Resp: '18  20 17  '$ Temp: 98 F (36.7 C)  97.8 F (36.6 C) 98 F (36.7 C)  TempSrc: Oral   Oral  SpO2: 97%  98% 98%  Weight:      Height:        General: Pt is alert, awake, not in acute distress Cardiovascular: RRR, S1/S2 +, no rubs, no gallops Respiratory: CTA bilaterally, no wheezing, no rhonchi Abdominal: Soft, NT, ND, bowel sounds + Extremities: no edema, no cyanosis    The results of significant diagnostics from this hospitalization (including imaging, microbiology, ancillary and laboratory) are listed below for reference.     Microbiology: Recent Results (from the past 240 hour(s))  Resp panel by RT-PCR (RSV, Flu A&B, Covid) Anterior Nasal Swab     Status: Abnormal   Collection Time: 10/02/22  2:30 PM   Specimen: Anterior Nasal Swab  Result Value Ref Range Status   SARS Coronavirus 2 by RT PCR POSITIVE (A) NEGATIVE Final    Comment: (NOTE) SARS-CoV-2 target nucleic acids are DETECTED.  The SARS-CoV-2 RNA is generally detectable in upper respiratory specimens during the acute phase of infection. Positive results are indicative of the presence of the identified virus, but do not rule out bacterial infection or co-infection with other pathogens not detected by the test. Clinical correlation with patient history and other diagnostic information is necessary to determine patient infection status. The expected result is Negative.  Fact Sheet for Patients: EntrepreneurPulse.com.au  Fact Sheet for Healthcare Providers: IncredibleEmployment.be  This test is not yet approved or cleared by the [place] FDA and  has been authorized for detection and/or diagnosis of SARS-CoV-2 by FDA under an Emergency Use Authorization (EUA).  This EUA will remain in effect (meaning this test can be used) for the duration of  the COVID-19 declaration under Section 564(b)(1) of the A ct, 21 U.S.C. section 360bbb-3(b)(1), unless the authorization is terminated or revoked sooner.     Influenza A by PCR NEGATIVE NEGATIVE Final   Influenza  B by PCR NEGATIVE NEGATIVE Final    Comment: (NOTE) The Xpert Xpress SARS-CoV-2/FLU/RSV plus assay is intended as an aid in the diagnosis of influenza from Nasopharyngeal swab specimens and should not be used as a sole basis for treatment. Nasal washings and aspirates are unacceptable for Xpert Xpress SARS-CoV-2/FLU/RSV testing.  Fact Sheet for Patients: EntrepreneurPulse.com.au  Fact Sheet for Healthcare Providers: IncredibleEmployment.be  This test is not yet approved or cleared by the [place] FDA and has been authorized for detection and/or diagnosis of SARS-CoV-2 by FDA under an Emergency Use Authorization (EUA). This EUA will remain in effect (meaning this test can be used) for the duration of the COVID-19 declaration under Section 564(b)(1) of the Act, 21 U.S.C. section 360bbb-3(b)(1), unless the authorization is terminated or revoked.     Resp Syncytial Virus by PCR NEGATIVE NEGATIVE Final    Comment: (NOTE) Fact Sheet for Patients: EntrepreneurPulse.com.au  Fact Sheet for Healthcare Providers: IncredibleEmployment.be  This test is not yet approved or cleared by the [place] FDA and has been authorized for detection and/or diagnosis of SARS-CoV-2 by FDA under an Emergency Use Authorization (EUA). This EUA will remain in effect (meaning this test can be used) for the duration of the COVID-19 declaration under Section 564(b)(1) of the Act, 21 U.S.C. section  360bbb-3(b)(1), unless the authorization is terminated or revoked.  Performed at [facility], [address] [place], [place] [address]   Culture, blood (routine x 2)     Status: None   Collection Time: 10/02/22  2:30 PM   Specimen: BLOOD  Result Value Ref Range Status   Specimen Description BLOOD BLOOD RIGHT WRIST  Final   Special Requests   Final    BOTTLES DRAWN AEROBIC AND ANAEROBIC Blood Culture adequate volume   Culture   Final    NO GROWTH 5 DAYS Performed at [facility], [address] [place], [place] [address]    Report Status 10/07/2022 FINAL  Final  Culture, blood (routine x 2)     Status: Abnormal   Collection Time: 10/02/22  2:30 PM   Specimen: BLOOD LEFT HAND  Result Value Ref Range Status   Specimen Description   Final    BLOOD LEFT HAND Performed at [facility], [place] [address], [place], [place] [address]    Special Requests   Final    BOTTLES DRAWN AEROBIC AND ANAEROBIC Blood Culture adequate volume Performed at [facility], [address] [place], [place] [address]    Culture  Setup Time   Final    GRAM POSITIVE COCCI ANAEROBIC BOTTLE ONLY CRITICAL RESULT CALLED TO, READ BACK BY AND VERIFIED WITH: CARISSA [facility] 10/03/22 1222 MW    Culture (A)  Final    STAPHYLOCOCCUS CAPITIS THE SIGNIFICANCE OF ISOLATING THIS ORGANISM FROM A SINGLE SET OF BLOOD CULTURES WHEN MULTIPLE SETS ARE DRAWN IS UNCERTAIN. PLEASE NOTIFY THE MICROBIOLOGY DEPARTMENT WITHIN ONE WEEK IF SPECIATION AND SENSITIVITIES ARE REQUIRED. Performed at [facility], [place] [address] [place], [place] [address]    Report Status 10/05/2022 FINAL  Final  Blood Culture ID Panel (Reflexed)     Status: Abnormal   Collection Time: 10/02/22  2:30 PM  Result Value Ref Range Status   Enterococcus faecalis NOT DETECTED NOT DETECTED Final   Enterococcus Faecium NOT DETECTED NOT DETECTED Final   Listeria monocytogenes NOT DETECTED NOT DETECTED Final    Staphylococcus species DETECTED (A) NOT DETECTED Final    Comment: CRITICAL RESULT  CALLED TO, READ BACK BY AND VERIFIED WITH: CARISSA [facility] 10/03/22 1222 MW    Staphylococcus aureus (BCID) NOT DETECTED NOT DETECTED Final   Staphylococcus epidermidis NOT DETECTED NOT DETECTED Final   Staphylococcus lugdunensis NOT DETECTED NOT DETECTED Final   Streptococcus species NOT DETECTED NOT DETECTED Final   Streptococcus agalactiae NOT DETECTED NOT DETECTED Final   Streptococcus pneumoniae NOT DETECTED NOT DETECTED Final   Streptococcus pyogenes NOT DETECTED NOT DETECTED Final   A.calcoaceticus-baumannii NOT DETECTED NOT DETECTED Final   Bacteroides fragilis NOT DETECTED NOT DETECTED Final   Enterobacterales NOT DETECTED NOT DETECTED Final   Enterobacter cloacae complex NOT DETECTED NOT DETECTED Final   Escherichia coli NOT DETECTED NOT DETECTED Final   Klebsiella aerogenes NOT DETECTED NOT DETECTED Final   Klebsiella oxytoca NOT DETECTED NOT DETECTED Final   Klebsiella pneumoniae NOT DETECTED NOT DETECTED Final   Proteus species NOT DETECTED NOT DETECTED Final   Salmonella species NOT DETECTED NOT DETECTED Final   Serratia marcescens NOT DETECTED NOT DETECTED Final   Haemophilus influenzae NOT DETECTED NOT DETECTED Final   Neisseria meningitidis NOT DETECTED NOT DETECTED Final   Pseudomonas aeruginosa NOT DETECTED NOT DETECTED Final   Stenotrophomonas maltophilia NOT DETECTED NOT DETECTED Final   Candida albicans NOT DETECTED NOT DETECTED Final   Candida auris NOT DETECTED NOT DETECTED Final   Candida glabrata NOT DETECTED NOT DETECTED Final   Candida krusei NOT DETECTED NOT DETECTED Final   Candida parapsilosis NOT DETECTED NOT DETECTED Final   Candida tropicalis NOT DETECTED NOT DETECTED Final   Cryptococcus neoformans/gattii NOT DETECTED NOT DETECTED Final    Comment: Performed at [facility], [address] [place], [place] [address]  MRSA Next Gen by PCR, Nasal     Status:  None   Collection Time: 10/03/22  1:55 AM   Specimen: Nasal Mucosa; Nasal Swab  Result Value Ref Range Status   MRSA by PCR Next Gen NOT DETECTED NOT DETECTED Final    Comment: (NOTE) The GeneXpert MRSA Assay (FDA approved for NASAL specimens only), is one component of a comprehensive MRSA colonization surveillance program. It is not intended to diagnose MRSA infection nor to guide or monitor treatment for MRSA infections. Test performance is not FDA approved in patients less than 60 years old. Performed at [facility], [address] [place], [place] [address]      Labs: BNP (last 3 results) Recent Labs    10/02/22 1430  BNP [id number]*   Basic Metabolic Panel: Recent Labs  Lab 10/02/22 1430 10/03/22 0313 10/03/22 1549 10/04/22 0745 10/05/22 1009 10/06/22 1151 10/09/22 0857  NA 138 141 137 135 135 140 139  K 3.3* 2.5* 3.4* 3.0* 3.4* 3.7 3.3*  CL 106 104 102 102 102 108 106  CO2 '22 26 25 26 22 24 25  '$ GLUCOSE 119* 89 91 102* 99 123* 104*  BUN 14 8 <5* <5* 19 26* 14  CREATININE 1.15* 0.78 0.62 0.66 1.20* 0.95 0.66  CALCIUM 8.6* 8.3* 8.5* 8.5* 8.7* 8.7* 8.6*  MG 2.0 1.6*  --  2.0  --   --   --    Liver Function Tests: Recent Labs  Lab 10/02/22 1430  AST 42*  ALT 13  ALKPHOS 133*  BILITOT 0.9  PROT 6.4*  ALBUMIN 2.9*   No results for input(s): "LIPASE", "AMYLASE" in the last 168 hours. No results for input(s): "AMMONIA" in the last 168 hours. CBC: Recent Labs  Lab 10/02/22 1430 10/03/22 0313 10/04/22 0745  10/05/22 1009 10/09/22 0857  WBC 6.6 5.4 5.8 5.8 7.6  NEUTROABS 5.1  --  4.0 3.6 5.9  HGB 9.8* 9.6* 11.7* 11.3* 10.4*  HCT 30.3* 29.4* 35.1* 33.7* 31.5*  MCV 98.1 96.1 93.1 93.9 94.9  PLT 247 260 298 281 323   Cardiac Enzymes: No results for input(s): "CKTOTAL", "CKMB", "CKMBINDEX", "TROPONINI" in the last 168 hours. BNP: Invalid input(s): "POCBNP" CBG: No results for input(s): "GLUCAP" in the last 168 hours. D-Dimer No results for  input(s): "DDIMER" in the last 72 hours. Hgb A1c No results for input(s): "HGBA1C" in the last 72 hours. Lipid Profile No results for input(s): "CHOL", "HDL", "LDLCALC", "TRIG", "CHOLHDL", "LDLDIRECT" in the last 72 hours. Thyroid function studies No results for input(s): "TSH", "T4TOTAL", "T3FREE", "THYROIDAB" in the last 72 hours.  Invalid input(s): "FREET3" Anemia work up No results for input(s): "VITAMINB12", "FOLATE", "FERRITIN", "TIBC", "IRON", "RETICCTPCT" in the last 72 hours. Urinalysis    Component Value Date/Time   COLORURINE STRAW (A) 10/02/2022 1500   APPEARANCEUR CLEAR (A) 10/02/2022 1500   APPEARANCEUR Cloudy (A) 01/02/2020 1538   LABSPEC 1.004 (L) 10/02/2022 1500   PHURINE 7.0 10/02/2022 1500   GLUCOSEU NEGATIVE 10/02/2022 1500   HGBUR NEGATIVE 10/02/2022 1500   BILIRUBINUR NEGATIVE 10/02/2022 1500   BILIRUBINUR negative 04/12/2020 0906   BILIRUBINUR Negative 06/19/2019 1421   KETONESUR 5 (A) 10/02/2022 1500   PROTEINUR NEGATIVE 10/02/2022 1500   UROBILINOGEN 0.2 04/12/2020 0906   UROBILINOGEN 0.2 03/31/2014 0939   NITRITE NEGATIVE 10/02/2022 1500   LEUKOCYTESUR NEGATIVE 10/02/2022 1500   Sepsis Labs Recent Labs  Lab 10/03/22 0313 10/04/22 0745 10/05/22 1009 10/09/22 0857  WBC 5.4 5.8 5.8 7.6   Microbiology Recent Results (from the past 240 hour(s))  Resp panel by RT-PCR (RSV, Flu A&B, Covid) Anterior Nasal Swab     Status: Abnormal   Collection Time: 10/02/22  2:30 PM   Specimen: Anterior Nasal Swab  Result Value Ref Range Status   SARS Coronavirus 2 by RT PCR POSITIVE (A) NEGATIVE Final    Comment: (NOTE) SARS-CoV-2 target nucleic acids are DETECTED.  The SARS-CoV-2 RNA is generally detectable in upper respiratory specimens during the acute phase of infection. Positive results are indicative of the presence of the identified virus, but do not rule out bacterial infection or co-infection with other pathogens not detected by the test. Clinical  correlation with patient history and other diagnostic information is necessary to determine patient infection status. The expected result is Negative.  Fact Sheet for Patients: EntrepreneurPulse.com.au  Fact Sheet for Healthcare Providers: IncredibleEmployment.be  This test is not yet approved or cleared by the [place] FDA and  has been authorized for detection and/or diagnosis of SARS-CoV-2 by FDA under an Emergency Use Authorization (EUA).  This EUA will remain in effect (meaning this test can be used) for the duration of  the COVID-19 declaration under Section 564(b)(1) of the A ct, 21 U.S.C. section 360bbb-3(b)(1), unless the authorization is terminated or revoked sooner.     Influenza A by PCR NEGATIVE NEGATIVE Final   Influenza B by PCR NEGATIVE NEGATIVE Final    Comment: (NOTE) The Xpert Xpress SARS-CoV-2/FLU/RSV plus assay is intended as an aid in the diagnosis of influenza from Nasopharyngeal swab specimens and should not be used as a sole basis for treatment. Nasal washings and aspirates are unacceptable for Xpert Xpress SARS-CoV-2/FLU/RSV testing.  Fact Sheet for Patients: EntrepreneurPulse.com.au  Fact Sheet for Healthcare Providers: IncredibleEmployment.be  This test is not yet approved  or cleared by the [place] and has been authorized for detection and/or diagnosis of SARS-CoV-2 by FDA under an Emergency Use Authorization (EUA). This EUA will remain in effect (meaning this test can be used) for the duration of the COVID-19 declaration under Section 564(b)(1) of the Act, 21 U.S.C. section 360bbb-3(b)(1), unless the authorization is terminated or revoked.     Resp Syncytial Virus by PCR NEGATIVE NEGATIVE Final    Comment: (NOTE) Fact Sheet for Patients: EntrepreneurPulse.com.au  Fact Sheet for Healthcare  Providers: IncredibleEmployment.be  This test is not yet approved or cleared by the [place] FDA and has been authorized for detection and/or diagnosis of SARS-CoV-2 by FDA under an Emergency Use Authorization (EUA). This EUA will remain in effect (meaning this test can be used) for the duration of the COVID-19 declaration under Section 564(b)(1) of the Act, 21 U.S.C. section 360bbb-3(b)(1), unless the authorization is terminated or revoked.  Performed at [facility], [address] [place], [place] [address]   Culture, blood (routine x 2)     Status: None   Collection Time: 10/02/22  2:30 PM   Specimen: BLOOD  Result Value Ref Range Status   Specimen Description BLOOD BLOOD RIGHT WRIST  Final   Special Requests   Final    BOTTLES DRAWN AEROBIC AND ANAEROBIC Blood Culture adequate volume   Culture   Final    NO GROWTH 5 DAYS Performed at [facility], [address], [place], [place] [address]    Report Status 10/07/2022 FINAL  Final  Culture, blood (routine x 2)     Status: Abnormal   Collection Time: 10/02/22  2:30 PM   Specimen: BLOOD LEFT HAND  Result Value Ref Range Status   Specimen Description   Final    BLOOD LEFT HAND Performed at [facility], [place] [address] [place], [place] [address]    Special Requests   Final    BOTTLES DRAWN AEROBIC AND ANAEROBIC Blood Culture adequate volume Performed at [facility], [address] [place], [place] [address]    Culture  Setup Time   Final    GRAM POSITIVE COCCI ANAEROBIC BOTTLE ONLY CRITICAL RESULT CALLED TO, READ BACK BY AND VERIFIED WITH: CARISSA [facility] 10/03/22 1222 MW    Culture (A)  Final    STAPHYLOCOCCUS CAPITIS THE SIGNIFICANCE OF ISOLATING THIS ORGANISM FROM A SINGLE SET OF BLOOD CULTURES WHEN MULTIPLE SETS ARE DRAWN IS UNCERTAIN. PLEASE NOTIFY THE MICROBIOLOGY DEPARTMENT WITHIN ONE WEEK IF SPECIATION AND SENSITIVITIES ARE REQUIRED. Performed at [facility], [place] [address] [place], [place] [address]    Report Status 10/05/2022 FINAL  Final  Blood Culture ID Panel (Reflexed)     Status: Abnormal   Collection Time: 10/02/22  2:30 PM  Result Value Ref Range Status   Enterococcus faecalis NOT DETECTED NOT DETECTED Final   Enterococcus Faecium NOT DETECTED NOT DETECTED Final   Listeria monocytogenes NOT DETECTED NOT DETECTED Final   Staphylococcus species DETECTED (A) NOT DETECTED Final    Comment: CRITICAL RESULT CALLED TO, READ BACK BY AND VERIFIED WITH: CARISSA [facility] 10/03/22 1222 MW    Staphylococcus aureus (BCID) NOT DETECTED NOT DETECTED Final   Staphylococcus epidermidis NOT DETECTED NOT DETECTED Final   Staphylococcus lugdunensis NOT DETECTED NOT DETECTED Final   Streptococcus species NOT DETECTED NOT DETECTED Final   Streptococcus agalactiae NOT DETECTED NOT DETECTED Final   Streptococcus pneumoniae NOT DETECTED NOT DETECTED Final   Streptococcus pyogenes  NOT DETECTED NOT DETECTED Final   A.calcoaceticus-baumannii NOT DETECTED NOT DETECTED Final   Bacteroides fragilis NOT DETECTED NOT DETECTED Final   Enterobacterales NOT DETECTED NOT DETECTED Final   Enterobacter cloacae complex NOT DETECTED NOT DETECTED Final   Escherichia coli NOT DETECTED NOT DETECTED Final   Klebsiella aerogenes NOT DETECTED NOT DETECTED Final   Klebsiella oxytoca NOT DETECTED NOT DETECTED Final   Klebsiella pneumoniae NOT DETECTED NOT DETECTED Final   Proteus species NOT DETECTED NOT DETECTED Final   Salmonella species NOT DETECTED NOT DETECTED Final   Serratia marcescens NOT DETECTED NOT DETECTED Final   Haemophilus influenzae NOT DETECTED NOT DETECTED Final   Neisseria meningitidis NOT DETECTED NOT DETECTED Final   Pseudomonas aeruginosa NOT DETECTED NOT DETECTED Final   Stenotrophomonas maltophilia NOT DETECTED NOT DETECTED Final   Candida albicans NOT DETECTED NOT DETECTED Final   Candida auris NOT DETECTED NOT DETECTED Final   Candida  glabrata NOT DETECTED NOT DETECTED Final   Candida krusei NOT DETECTED NOT DETECTED Final   Candida parapsilosis NOT DETECTED NOT DETECTED Final   Candida tropicalis NOT DETECTED NOT DETECTED Final   Cryptococcus neoformans/gattii NOT DETECTED NOT DETECTED Final    Comment: Performed at [facility], [address] [place], [place] [address]  MRSA Next Gen by PCR, Nasal     Status: None   Collection Time: 10/03/22  1:55 AM   Specimen: Nasal Mucosa; Nasal Swab  Result Value Ref Range Status   MRSA by PCR Next Gen NOT DETECTED NOT DETECTED Final    Comment: (NOTE) The GeneXpert MRSA Assay (FDA approved for NASAL specimens only), is one component of a comprehensive MRSA colonization surveillance program. It is not intended to diagnose MRSA infection nor to guide or monitor treatment for MRSA infections. Test performance is not FDA approved in patients less than 51 years old. Performed at [facility], [address], [place], [place] [address]      Time coordinating discharge: Over 30 minutes  SIGNED:   Sidney Ace, MD  [facility] 10/09/2022, 10:01 AM Pager   If 7PM-7AM, please contact night-coverage

## 2022-10-09 NOTE — TOC Transition Note (Signed)
Transition of Care [facility]) - CM/SW Discharge Note   Patient Details  Name: Kristin [name] MRN: [id number] Date of Birth: [birth year]  Transition [facility] Care [facility]) CM/SW Contact:  Candie Chroman, LCSW Phone Number: 10/09/2022, 10:58 AM   Clinical Narrative:   Patient has orders to discharge to [facility] SNF today. RN has already called report. EMS transport has been arranged and she is first on the list. No further concerns. CSW signing off.  Final next level of care: [place] Barriers to Discharge: Barriers Resolved   Patient Goals and CMS Choice   Choice offered to / list presented to : Spouse  Discharge Placement     Existing PASRR number confirmed : 10/04/22          Patient chooses bed at: [facility] Patient to be transferred to facility by: EMS Name of family member notified: Doree Albee Patient and family notified of of transfer: 10/09/22  Discharge Plan and Services Additional resources added to the After Visit Summary for                                       Social Determinants of Health (SDOH) Interventions SDOH Screenings   Food Insecurity: No Food Insecurity (08/24/2022)  Housing: Low Risk  (08/24/2022)  Transportation Needs: No Transportation Needs (08/24/2022)  Utilities: Not At Risk (08/24/2022)  Alcohol Screen: Low Risk  (09/21/2021)  Depression (PHQ2-9): Low Risk  (01/11/2022)  Financial Resource Strain: Low Risk  (06/13/2021)  Physical Activity: Insufficiently Active (06/13/2021)  Social Connections: Moderately Isolated (06/13/2021)  Stress: No Stress Concern Present (01/11/2022)  Tobacco Use: Low Risk  (08/23/2022)     Readmission Risk Interventions     No data to display

## 2022-10-09 NOTE — TOC Progression Note (Signed)
Transition of Care [facility]) - Progression Note    Patient Details  Name: Kristin [name] MRN: [id number] Date of Birth: 1941-05-13  Transition of Care [facility]) CM/SW [place], LCSW Phone Number: 10/09/2022, 9:51 AM  Clinical Narrative:  Called husband and provided update.   Expected Discharge Plan and Services                                               Social Determinants of Health (SDOH) Interventions SDOH Screenings   Food Insecurity: No Food Insecurity (08/24/2022)  Housing: Low Risk  (08/24/2022)  Transportation Needs: No Transportation Needs (08/24/2022)  Utilities: Not At Risk (08/24/2022)  Alcohol Screen: Low Risk  (09/21/2021)  Depression (PHQ2-9): Low Risk  (01/11/2022)  Financial Resource Strain: Low Risk  (06/13/2021)  Physical Activity: Insufficiently Active (06/13/2021)  Social Connections: Moderately Isolated (06/13/2021)  Stress: No Stress Concern Present (01/11/2022)  Tobacco Use: Low Risk  (08/23/2022)    Readmission Risk Interventions     No data to display

## 2022-10-10 DIAGNOSIS — I10 Essential (primary) hypertension: Secondary | ICD-10-CM | POA: Diagnosis not present

## 2022-10-10 DIAGNOSIS — L89620 Pressure ulcer of left heel, unstageable: Secondary | ICD-10-CM | POA: Diagnosis not present

## 2022-10-10 DIAGNOSIS — E785 Hyperlipidemia, unspecified: Secondary | ICD-10-CM | POA: Diagnosis not present

## 2022-10-10 DIAGNOSIS — F0153 Vascular dementia, unspecified severity, with mood disturbance: Secondary | ICD-10-CM | POA: Diagnosis not present

## 2022-10-10 DIAGNOSIS — U071 COVID-19: Secondary | ICD-10-CM | POA: Diagnosis not present

## 2022-10-12 DIAGNOSIS — E872 Acidosis, unspecified: Secondary | ICD-10-CM | POA: Diagnosis not present

## 2022-10-12 DIAGNOSIS — E871 Hypo-osmolality and hyponatremia: Secondary | ICD-10-CM | POA: Diagnosis not present

## 2022-10-12 DIAGNOSIS — E875 Hyperkalemia: Secondary | ICD-10-CM | POA: Diagnosis not present

## 2022-10-16 DIAGNOSIS — N390 Urinary tract infection, site not specified: Secondary | ICD-10-CM | POA: Diagnosis not present

## 2022-10-17 DIAGNOSIS — E785 Hyperlipidemia, unspecified: Secondary | ICD-10-CM | POA: Diagnosis not present

## 2022-10-17 DIAGNOSIS — F0153 Vascular dementia, unspecified severity, with mood disturbance: Secondary | ICD-10-CM | POA: Diagnosis not present

## 2022-10-17 DIAGNOSIS — N390 Urinary tract infection, site not specified: Secondary | ICD-10-CM | POA: Diagnosis not present

## 2022-10-17 DIAGNOSIS — L89620 Pressure ulcer of left heel, unstageable: Secondary | ICD-10-CM | POA: Diagnosis not present

## 2022-10-17 DIAGNOSIS — I10 Essential (primary) hypertension: Secondary | ICD-10-CM | POA: Diagnosis not present

## 2022-10-18 DIAGNOSIS — N390 Urinary tract infection, site not specified: Secondary | ICD-10-CM | POA: Diagnosis not present

## 2022-10-19 DIAGNOSIS — N390 Urinary tract infection, site not specified: Secondary | ICD-10-CM | POA: Diagnosis not present

## 2022-10-23 ENCOUNTER — Ambulatory Visit: Payer: PPO | Admitting: Pharmacist

## 2022-10-23 DIAGNOSIS — R634 Abnormal weight loss: Secondary | ICD-10-CM | POA: Diagnosis not present

## 2022-10-23 DIAGNOSIS — I10 Essential (primary) hypertension: Secondary | ICD-10-CM

## 2022-10-23 NOTE — Progress Notes (Signed)
10/23/2022 Name: Kristin [name] MRN: [id number] DOB: May 21, 1941  Chief Complaint  Patient presents with   Medication Management    Kristin [name] is a 82 y.o. year old female who presented for a telephone visit.   They were referred to the pharmacist by their PCP for assistance in managing hypertension and medication adherence.      Subjective:   Care Team: Primary Care Provider: [name], NP ; Next Scheduled Visit: 07/24/2022 Cardiologist: Wellington Hampshire, MD Neurologist: Rufina Falco; Next Scheduled Visit: 06/12/2022 Rheumatologist: Shirlyn Goltz, MD; Next Scheduled Visit: 11/20/2022  Medication Access/Adherence  Current Pharmacy:  [place], [place] [address] [place] [address] Phone: [phone] Fax: [phone]   Patient reports affordability concerns with their medications: No  Patient reports access/transportation concerns to their pharmacy: No  Patient reports adherence concerns with their medications:  No    From review of chart, note:  Patient admitted to [facility] 08/23/2022 - 08/31/2022 related to fall/fracture. At discharge, patient advised: Start:  - clonidine 0.2 mg - take 1 tablet three times daily  - oxycodone-acetaminophen 5-325 mg - take 1 tablet every 4 hours as needed for moderate or severe pain  - trazodone 50 mg - take 1/2 tablet (25 mg) by mouth at bedtime as needed for sleep  Change how taking:  - duloxetine 30 mg - take 1 capsule twice daily  Patient admitted to [facility] 10/02/2022 related to COVID-19 infection. Discharged to SNF on 10/09/2022 - Note at discharge, patient advised to STOP oxyCODONE-acetaminophen and instead Start oxycodone 5 mg - "1 tablet (5 mg total) by mouth every 6 (six) hours as needed for up to 3 days for severe pain. Snf use only"  Today speak with patient's husband/caregiver who advises admitted to [place]. Reports  has a meeting with SNF tomorrow to determine if patient will be going home or needing to stay in long term care  Note medications currently administered by and patient monitored by SNF team.  Objective:  Lab Results  Component Value Date   CREATININE 0.66 10/09/2022   BUN 14 10/09/2022   NA 139 10/09/2022   K 3.3 (L) 10/09/2022   CL 106 10/09/2022   CO2 25 10/09/2022   BP Readings from Last 3 Encounters:  10/09/22 (!) 148/53  08/31/22 (!) 143/62  07/24/22 136/68   Pulse Readings from Last 3 Encounters:  10/09/22 78  08/31/22 72  07/24/22 65     Medications Reviewed Today     Reviewed by Kristin [name], CPhT (Pharmacy Technician) on 10/02/22 at 2322  Med List Status: Complete   Medication Order Taking? Sig Documenting Provider Last Dose Status Informant  acetaminophen (TYLENOL) 325 MG tablet [id number] Yes Take 975 mg by mouth 3 (three) times daily. [provider] unknown Active Nursing Home Medication Administration Guide (MAG)  aspirin 81 MG chewable tablet [id number] Yes Chew 81 mg by mouth daily. [provider] unknown Active Nursing Home Medication Administration Guide (MAG)  Calcium Carbonate-Vitamin D (CALTRATE 600+D PO) [id number] Yes Take 1 tablet by mouth daily at 12 noon. [provider] unknown Active Nursing Home Medication Administration Guide (MAG)  ciprofloxacin (CIPRO) 500 MG tablet [id number] Yes Take 500 mg by mouth 2 (two) times daily. [provider] unknown Active Nursing Home Medication Administration Guide (MAG)  cloNIDine (CATAPRES) 0.2 MG tablet [id number] Yes Take 1 tablet (0.2 mg total) by mouth 3 (  three) times daily.  Patient taking differently: Take 0.2 mg by mouth every 12 (twelve) hours.   Lucillie Garfinkel, MD unknown Active Nursing Home Medication Administration Guide (MAG)  Cranberry 400 MG TABS [id number] Yes Take 1 tablet by mouth daily. [provider] unknown Active Nursing Home Medication  Administration Guide (MAG)  diclofenac Sodium (VOLTAREN) 1 % GEL [id number] Yes Apply 2 g topically 4 (four) times daily as needed. [provider] prn prn Active Nursing Home Medication Administration Guide (MAG)  diltiazem (CARDIZEM CD) 240 MG 24 hr capsule [id number] Yes TAKE 1 CAPSULE BY MOUTH DAILY Wellington Hampshire, MD unknown Active Nursing Home Medication Administration Guide (MAG)  donepezil (ARICEPT) 10 MG tablet [id number] Yes Take 10 mg by mouth at bedtime. [provider] unknown Active Nursing Home Medication Administration Guide (MAG)  DULoxetine (CYMBALTA) 30 MG capsule [id number] Yes Take 1 capsule (30 mg total) by mouth 2 (two) times daily. Hollice Gong Mir M, MD unknown Active Nursing Home Medication Administration Guide (MAG)  feeding supplement (ENSURE ENLIVE / ENSURE PLUS) LIQD [id number]  Take 237 mLs by mouth 3 (three) times daily between meals. Hollice Gong Mir M, MD  Active Nursing Home Medication Administration Guide (MAG)  ferrous sulfate 325 (65 FE) MG tablet [id number] Yes Take 325 mg by mouth daily with breakfast. [provider] unknown Active Nursing Home Medication Administration Guide (MAG)  gabapentin (NEURONTIN) 400 MG capsule [id number] Yes Take 400 mg by mouth 2 (two) times daily. [provider] unknown Active Nursing Home Medication Administration Guide (MAG)  Glycerin-Hypromellose-PEG 400 0.2-0.2-1 % SOLN [id number] Yes Apply to eye. [provider] prn prn Active Nursing Home Medication Administration Guide (MAG)  Lactobacillus Rhamnosus, GG, (CULTURELLE PO) [id number] Yes Take 1 capsule by mouth daily. [provider] unknown Active Nursing Home Medication Administration Guide (MAG)  memantine (NAMENDA) 10 MG tablet [id number] Yes TAKE 1 TABLET BY MOUTH TWICE DAILY Baity, Coralie Keens, NP unknown Active Nursing Home Medication Administration Guide (MAG)  Multiple Vitamin (MULTIVITAMIN) tablet [id number] Yes Take 1 tablet by  mouth daily. [provider] unknown Active Nursing Home Medication Administration Guide (MAG)  Omega-3 Fatty Acids (FISH OIL) 1200 MG CAPS [id number] Yes Take 1 capsule by mouth daily.  [provider] unknown Active Nursing Home Medication Administration Guide (MAG)  oxyCODONE (OXY IR/ROXICODONE) 5 MG immediate release tablet [id number] Yes Take 5 mg by mouth every 6 (six) hours as needed for severe pain. [provider] prn prn Active Nursing Home Medication Administration Guide (MAG)  oxyCODONE-acetaminophen (PERCOCET/ROXICET) 5-325 MG tablet [id number] No Take 1 tablet by mouth every 4 (four) hours as needed for moderate pain or severe pain.  Patient not taking: Reported on 10/02/2022   Lucillie Garfinkel, MD Not Taking Active Nursing Home Medication Administration Guide (MAG)  tamsulosin [facility]) 0.4 MG CAPS capsule [id number] Yes Take 0.4 mg by mouth at bedtime. [provider] unknown Active Nursing Home Medication Administration Guide (MAG)  traZODone (DESYREL) 50 MG tablet [id number] Yes Take 0.5 tablets (25 mg total) by mouth at bedtime as needed for sleep. Lucillie Garfinkel, MD prn prn Active Nursing Home Medication Administration Guide (MAG)  Med List Note Laney Potash, CPhT 10/02/22 2100): [facility] tel [phone] fax [phone]              Assessment/Plan:   Spouse reports has a meeting with SNF tomorrow to determine if/when patient will be going home or plan for long term care - Spouse confirms if patient  transitioning from facility, will follow up for medication list and prescriptions   Counsel on importance of scheduling follow up appointment with PCP  Spouse requests to schedule a follow up appointment with Clinical Pharmacist in 1 month as anticipates patient returning home  Follow Up Plan: Clincial Pharmacist will follow up with patient/caregiver by telephone on 11/20/2022 at 1:45 PM   Wallace Cullens, PharmD,  [place] [phone]

## 2022-10-23 NOTE — Patient Instructions (Signed)
Goals Addressed             This Visit's Progress    Pharmacy goals       Please check blood pressure at home, keep log, and bring to medical appointments  Feel free to call me with any questions or concerns. I look forward to our next call!  Abed Schar Leisa Gault, PharmD, BCACP, CPP Clinical Pharmacist [facility] [phone]         

## 2022-10-24 DIAGNOSIS — L89620 Pressure ulcer of left heel, unstageable: Secondary | ICD-10-CM | POA: Diagnosis not present

## 2022-10-24 DIAGNOSIS — R634 Abnormal weight loss: Secondary | ICD-10-CM | POA: Diagnosis not present

## 2022-10-24 DIAGNOSIS — I10 Essential (primary) hypertension: Secondary | ICD-10-CM | POA: Diagnosis not present

## 2022-10-24 DIAGNOSIS — F0153 Vascular dementia, unspecified severity, with mood disturbance: Secondary | ICD-10-CM | POA: Diagnosis not present

## 2022-10-24 DIAGNOSIS — E785 Hyperlipidemia, unspecified: Secondary | ICD-10-CM | POA: Diagnosis not present

## 2022-10-25 DIAGNOSIS — R634 Abnormal weight loss: Secondary | ICD-10-CM | POA: Diagnosis not present

## 2022-10-26 DIAGNOSIS — D649 Anemia, unspecified: Secondary | ICD-10-CM | POA: Diagnosis not present

## 2022-10-26 DIAGNOSIS — R634 Abnormal weight loss: Secondary | ICD-10-CM | POA: Diagnosis not present

## 2022-10-30 DIAGNOSIS — Z515 Encounter for palliative care: Secondary | ICD-10-CM | POA: Diagnosis not present

## 2022-10-31 DIAGNOSIS — R110 Nausea: Secondary | ICD-10-CM | POA: Diagnosis not present

## 2022-11-01 DIAGNOSIS — L89620 Pressure ulcer of left heel, unstageable: Secondary | ICD-10-CM | POA: Diagnosis not present

## 2022-11-01 DIAGNOSIS — F0153 Vascular dementia, unspecified severity, with mood disturbance: Secondary | ICD-10-CM | POA: Diagnosis not present

## 2022-11-01 DIAGNOSIS — I10 Essential (primary) hypertension: Secondary | ICD-10-CM | POA: Diagnosis not present

## 2022-11-01 DIAGNOSIS — E785 Hyperlipidemia, unspecified: Secondary | ICD-10-CM | POA: Diagnosis not present

## 2022-11-03 DIAGNOSIS — E785 Hyperlipidemia, unspecified: Secondary | ICD-10-CM | POA: Diagnosis not present

## 2022-11-03 DIAGNOSIS — M797 Fibromyalgia: Secondary | ICD-10-CM | POA: Diagnosis not present

## 2022-11-03 DIAGNOSIS — I10 Essential (primary) hypertension: Secondary | ICD-10-CM | POA: Diagnosis not present

## 2022-11-06 DIAGNOSIS — E876 Hypokalemia: Secondary | ICD-10-CM | POA: Diagnosis not present

## 2022-11-06 DIAGNOSIS — I10 Essential (primary) hypertension: Secondary | ICD-10-CM | POA: Diagnosis not present

## 2022-11-06 DIAGNOSIS — E785 Hyperlipidemia, unspecified: Secondary | ICD-10-CM | POA: Diagnosis not present

## 2022-11-07 DIAGNOSIS — I959 Hypotension, unspecified: Secondary | ICD-10-CM | POA: Diagnosis not present

## 2022-11-08 DIAGNOSIS — L89620 Pressure ulcer of left heel, unstageable: Secondary | ICD-10-CM | POA: Diagnosis not present

## 2022-11-08 DIAGNOSIS — F0153 Vascular dementia, unspecified severity, with mood disturbance: Secondary | ICD-10-CM | POA: Diagnosis not present

## 2022-11-08 DIAGNOSIS — I10 Essential (primary) hypertension: Secondary | ICD-10-CM | POA: Diagnosis not present

## 2022-11-08 DIAGNOSIS — E785 Hyperlipidemia, unspecified: Secondary | ICD-10-CM | POA: Diagnosis not present

## 2022-11-08 DIAGNOSIS — M797 Fibromyalgia: Secondary | ICD-10-CM | POA: Diagnosis not present

## 2022-11-10 DIAGNOSIS — E875 Hyperkalemia: Secondary | ICD-10-CM | POA: Diagnosis not present

## 2022-11-10 DIAGNOSIS — D649 Anemia, unspecified: Secondary | ICD-10-CM | POA: Diagnosis not present

## 2022-11-10 DIAGNOSIS — E872 Acidosis, unspecified: Secondary | ICD-10-CM | POA: Diagnosis not present

## 2022-11-13 DIAGNOSIS — I25111 Atherosclerotic heart disease of native coronary artery with angina pectoris with documented spasm: Secondary | ICD-10-CM | POA: Diagnosis not present

## 2022-11-13 DIAGNOSIS — I10 Essential (primary) hypertension: Secondary | ICD-10-CM | POA: Diagnosis not present

## 2022-11-13 DIAGNOSIS — M797 Fibromyalgia: Secondary | ICD-10-CM | POA: Diagnosis not present

## 2022-11-14 DIAGNOSIS — I10 Essential (primary) hypertension: Secondary | ICD-10-CM | POA: Diagnosis not present

## 2022-11-14 DIAGNOSIS — D649 Anemia, unspecified: Secondary | ICD-10-CM | POA: Diagnosis not present

## 2022-11-14 DIAGNOSIS — E785 Hyperlipidemia, unspecified: Secondary | ICD-10-CM | POA: Diagnosis not present

## 2022-11-15 DIAGNOSIS — L89620 Pressure ulcer of left heel, unstageable: Secondary | ICD-10-CM | POA: Diagnosis not present

## 2022-11-15 DIAGNOSIS — I10 Essential (primary) hypertension: Secondary | ICD-10-CM | POA: Diagnosis not present

## 2022-11-15 DIAGNOSIS — I25111 Atherosclerotic heart disease of native coronary artery with angina pectoris with documented spasm: Secondary | ICD-10-CM | POA: Diagnosis not present

## 2022-11-15 DIAGNOSIS — E785 Hyperlipidemia, unspecified: Secondary | ICD-10-CM | POA: Diagnosis not present

## 2022-11-15 DIAGNOSIS — F0153 Vascular dementia, unspecified severity, with mood disturbance: Secondary | ICD-10-CM | POA: Diagnosis not present

## 2022-11-16 ENCOUNTER — Encounter: Payer: Self-pay | Admitting: Podiatry

## 2022-11-16 DIAGNOSIS — E785 Hyperlipidemia, unspecified: Secondary | ICD-10-CM | POA: Diagnosis not present

## 2022-11-16 DIAGNOSIS — I10 Essential (primary) hypertension: Secondary | ICD-10-CM | POA: Diagnosis not present

## 2022-11-16 DIAGNOSIS — E876 Hypokalemia: Secondary | ICD-10-CM | POA: Diagnosis not present

## 2022-11-20 ENCOUNTER — Telehealth: Payer: PPO

## 2022-11-20 DIAGNOSIS — F331 Major depressive disorder, recurrent, moderate: Secondary | ICD-10-CM | POA: Diagnosis not present

## 2022-11-20 DIAGNOSIS — I10 Essential (primary) hypertension: Secondary | ICD-10-CM | POA: Diagnosis not present

## 2022-11-20 DIAGNOSIS — F419 Anxiety disorder, unspecified: Secondary | ICD-10-CM | POA: Diagnosis not present

## 2022-11-20 DIAGNOSIS — F0150 Vascular dementia without behavioral disturbance: Secondary | ICD-10-CM | POA: Diagnosis not present

## 2022-11-20 DIAGNOSIS — G4700 Insomnia, unspecified: Secondary | ICD-10-CM | POA: Diagnosis not present

## 2022-11-20 DIAGNOSIS — I25111 Atherosclerotic heart disease of native coronary artery with angina pectoris with documented spasm: Secondary | ICD-10-CM | POA: Diagnosis not present

## 2022-11-20 DIAGNOSIS — E785 Hyperlipidemia, unspecified: Secondary | ICD-10-CM | POA: Diagnosis not present

## 2022-11-21 DIAGNOSIS — W19XXXA Unspecified fall, initial encounter: Secondary | ICD-10-CM | POA: Diagnosis not present

## 2022-11-21 DIAGNOSIS — D649 Anemia, unspecified: Secondary | ICD-10-CM | POA: Diagnosis not present

## 2022-11-22 DIAGNOSIS — M797 Fibromyalgia: Secondary | ICD-10-CM | POA: Diagnosis not present

## 2022-11-22 DIAGNOSIS — E785 Hyperlipidemia, unspecified: Secondary | ICD-10-CM | POA: Diagnosis not present

## 2022-11-22 DIAGNOSIS — D649 Anemia, unspecified: Secondary | ICD-10-CM | POA: Diagnosis not present

## 2022-11-23 ENCOUNTER — Telehealth: Payer: Self-pay

## 2022-11-23 ENCOUNTER — Ambulatory Visit (INDEPENDENT_AMBULATORY_CARE_PROVIDER_SITE_OTHER): Payer: PPO | Admitting: Urology

## 2022-11-23 VITALS — BP 92/51 | HR 78

## 2022-11-23 DIAGNOSIS — E785 Hyperlipidemia, unspecified: Secondary | ICD-10-CM | POA: Diagnosis not present

## 2022-11-23 DIAGNOSIS — R339 Retention of urine, unspecified: Secondary | ICD-10-CM | POA: Diagnosis not present

## 2022-11-23 DIAGNOSIS — I10 Essential (primary) hypertension: Secondary | ICD-10-CM | POA: Diagnosis not present

## 2022-11-23 DIAGNOSIS — I25111 Atherosclerotic heart disease of native coronary artery with angina pectoris with documented spasm: Secondary | ICD-10-CM | POA: Diagnosis not present

## 2022-11-23 NOTE — Telephone Encounter (Signed)
Will have however she has been hospitalized twice since January and have not seen her in follow-up.  She needs to schedule a follow-up appointment with me.

## 2022-11-23 NOTE — Telephone Encounter (Signed)
Copied from CRM [id number]. Topic: General - Other >> Nov 22, 2022  4:45 PM Macon Large wrote: Reason for CRM: Adelina Mings with [facility] called to ask if Nicki Reaper will follow for home health orders. Cb# [phone]

## 2022-11-23 NOTE — Progress Notes (Signed)
I, Amy L Pierron,acting as a scribe for Vanna Scotland, MD.,have documented all relevant documentation on the behalf of Vanna Scotland, MD,as directed by  Vanna Scotland, MD while in the presence of Vanna Scotland, MD.  11/23/2022 11:21 AM   Kristin [name] 1940/12/25 [id number]  Referring provider: Lorre Munroe, NP [address] [place],  [place] [address]  Chief Complaint  Patient presents with   Urinary Retention    HPI: 82 year-old female who presents day for further evaluation of urinary retention.   She was sent here by her [employer] [facility].  She was discharged from the hospital on October 09, 2022 after being admitted to [facility] for hypoxia, COVID-19, and multiple other comorbidities. Upon arrival to [facility] she was known to be in urinary retention and a Foley catheter was placed. At the facility they attempted two or three voiding trials which were unsuccessful. The most recent one was yesterday. They took out the catheter first thing in the morning and eight hours later she had yet to void. They ended up replacing the catheter yesterday evening with an output of 500 cc's upon placement. She is on Flomax.  Unfortunately, today the patient is unaccompanied. Her husband was supposed to come, but he has another appointment and is not reachable by telephone today. She has advanced dementia, is unable to provide any additional history. She's accompanied by a Research scientist (physical sciences) from the facility. Foley catheter in place draining clear, yellow urine.   PMH: Past Medical History:  Diagnosis Date   Allergy    Arthritis    recent falls -aug 2010   [name] (coronary artery disease)    mi-1996   Complication of anesthesia    pt states takes a long time to wake her wake up   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) 08/27/2015   Cough    no fever    Degeneration of lumbar or lumbosacral intervertebral  disc    Dizziness    r/t meds   DVT (deep venous thrombosis) (HCC)    Fibromyalgia    History of shingles    [name] (irritable bowel syndrome)    takes OTC Vear Clock colon health   Joint pain    Joint swelling    Myalgia and myositis, unspecified    Obstructive sleep apnea (adult) (pediatric)    Old myocardial infarction 1996   Osteoarthritis    Osteoarthrosis, unspecified whether generalized or localized, unspecified site    Psoriatic arthritis (HCC)    Rheumatoid arthritis (HCC)    Rosacea    Seasonal allergies    Stroke (HCC)    ischaemic microvascular disease   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 Mild distal LAD and proximal RCA disease.   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   Losartan    Dizziness   Methotrexate Derivatives    Dizziness, rash   Spironolactone    Dizziness   Penicillins Hives, Rash        Medication List        Accurate as of November 23, 2022 11:21 AM. If you have any questions, ask your nurse or doctor.          STOP taking these medications    diclofenac Sodium 1 % Gel Commonly known as: VOLTAREN Stopped by: Vanna ScotlandAshley Markela Wee, MD   feeding supplement Liqd Stopped by: Vanna ScotlandAshley Bladyn Tipps, MD   Fish Oil 1200 MG Caps Stopped by: Vanna ScotlandAshley Kinzlie Harney, MD   Glycerin-Hypromellose-PEG 400 0.2-0.2-1 % Soln Stopped by: Vanna ScotlandAshley Mako Pelfrey, MD       TAKE these medications    acetaminophen 325 MG tablet Commonly known as: TYLENOL Take 975 mg by mouth 3 (three) times daily.   aspirin 81 MG chewable tablet Chew 81 mg by mouth daily.   CALTRATE 600+D PO Take 1 tablet by mouth daily at 12 noon.   cloNIDine 0.1 MG tablet Commonly known as: CATAPRES Take 0.1 mg by mouth 3 (three) times daily. What changed: Another medication with the same name was removed. Continue taking this medication, and follow the directions you see here. Changed by: Vanna ScotlandAshley Adaline Trejos, MD   Cranberry 400 MG Tabs Take 1 tablet by mouth daily.   CULTURELLE PO Take 1 capsule by mouth daily.   diltiazem 240 MG 24 hr capsule Commonly known as: CARDIZEM CD TAKE 1 CAPSULE BY MOUTH DAILY   donepezil 10 MG tablet Commonly known as: ARICEPT Take 10 mg by mouth at bedtime.   DULoxetine 30 MG capsule Commonly known as: CYMBALTA Take 1 capsule (30 mg total) by mouth 2 (two) times daily.   ferrous sulfate 325 (65 FE) MG tablet Take 325 mg by mouth daily with breakfast.   gabapentin 400 MG capsule Commonly known as: NEURONTIN Take 400 mg by mouth 2 (two) times daily. What changed: Another medication with the same name was removed. Continue taking this medication, and follow the directions you see  here. Changed by: Vanna ScotlandAshley Arkel Cartwright, MD   memantine 10 MG tablet Commonly known as: NAMENDA TAKE 1 TABLET BY MOUTH TWICE DAILY   multivitamin tablet Take 1 tablet by mouth daily.   tamsulosin 0.4 MG Caps capsule Commonly known as: FLOMAX Take 0.4 mg by mouth at bedtime.   traZODone 50 MG tablet Commonly known as: DESYREL Take 0.5 tablets (25 mg total) by mouth at bedtime as needed for sleep.        Allergies:  Allergies  Allergen Reactions   Angiotensin Receptor Blockers Anaphylaxis    Fatigue & Dizziness   Other Anaphylaxis  Other reaction(s): Other (See Comments) Dizziness, rash Fatigue & Dizziness   Hydralazine Other (See Comments)    Unknown   Latex     REACTION: Rash, blisters, breathing probs.   Losartan     Dizziness    Methotrexate Derivatives     Dizziness, rash    Spironolactone     Dizziness    Penicillins Hives and Rash    Family History: Family History  Problem Relation Age of Onset   Colon cancer Father    Heart attack Mother    Coronary artery disease Mother    Osteoporosis Mother    Heart disease Mother    Heart failure Mother    GER disease Sister    Skin cancer Sister    Hypertension Sister    Osteopenia Sister    GER disease Sister    Breast cancer Paternal Aunt        age 86   Heart failure Maternal Grandmother    Heart failure Maternal Grandfather     Social History:  reports that she has never smoked. She has never used smokeless tobacco. She reports that she does not drink alcohol and does not use drugs.   Physical Exam: BP (!) 92/51   Pulse 78   LMP 08/14/1989   Constitutional:  Alert and oriented, No acute distress. In a wheelchair. HEENT: [place] AT, moist mucus membranes.  Trachea midline, no masses. Neurologic: Grossly intact, no focal deficits, moving all 4 extremities. Psychiatric: Advanced dementia. Not able to answer questions properly.    Assessment & Plan:    Urinary retention   -  Likely multifactorial.     - Failed multiple voiding trials, including just yesterday. There's no indication to try a repeat voiding trial today. It's reasonable to keep her on Flomax and have return in about a month for a repeat void trial.   - She's not able to contribute anything to her history.    Return in about 1 month (around 12/23/2022) for voiding trial.  I have reviewed the above documentation for accuracy and completeness, and I agree with the above.   Vanna Scotland, MD   [facility] [address] [place], [place] [address] [phone]

## 2022-11-23 NOTE — Telephone Encounter (Signed)
Tried calling; Pt's voice mail is not set up.  PEC please schedule pt if she calls back.   Thanks,   -Vernona Rieger

## 2022-11-24 DIAGNOSIS — E876 Hypokalemia: Secondary | ICD-10-CM | POA: Diagnosis not present

## 2022-11-24 DIAGNOSIS — J9601 Acute respiratory failure with hypoxia: Secondary | ICD-10-CM | POA: Diagnosis not present

## 2022-11-24 DIAGNOSIS — N390 Urinary tract infection, site not specified: Secondary | ICD-10-CM | POA: Diagnosis not present

## 2022-11-24 DIAGNOSIS — I25111 Atherosclerotic heart disease of native coronary artery with angina pectoris with documented spasm: Secondary | ICD-10-CM | POA: Diagnosis not present

## 2022-11-24 DIAGNOSIS — Z7189 Other specified counseling: Secondary | ICD-10-CM | POA: Diagnosis not present

## 2022-11-24 DIAGNOSIS — M797 Fibromyalgia: Secondary | ICD-10-CM | POA: Diagnosis not present

## 2022-11-24 DIAGNOSIS — E785 Hyperlipidemia, unspecified: Secondary | ICD-10-CM | POA: Diagnosis not present

## 2022-11-24 DIAGNOSIS — D649 Anemia, unspecified: Secondary | ICD-10-CM | POA: Diagnosis not present

## 2022-11-24 DIAGNOSIS — F0153 Vascular dementia, unspecified severity, with mood disturbance: Secondary | ICD-10-CM | POA: Diagnosis not present

## 2022-11-24 DIAGNOSIS — S32810D Multiple fractures of pelvis with stable disruption of pelvic ring, subsequent encounter for fracture with routine healing: Secondary | ICD-10-CM | POA: Diagnosis not present

## 2022-11-24 DIAGNOSIS — M6281 Muscle weakness (generalized): Secondary | ICD-10-CM | POA: Diagnosis not present

## 2022-11-24 DIAGNOSIS — I10 Essential (primary) hypertension: Secondary | ICD-10-CM | POA: Diagnosis not present

## 2022-11-27 DIAGNOSIS — Z556 Problems related to health literacy: Secondary | ICD-10-CM | POA: Diagnosis not present

## 2022-11-27 DIAGNOSIS — D649 Anemia, unspecified: Secondary | ICD-10-CM | POA: Diagnosis not present

## 2022-11-27 DIAGNOSIS — F32A Depression, unspecified: Secondary | ICD-10-CM | POA: Diagnosis not present

## 2022-11-27 DIAGNOSIS — F0393 Unspecified dementia, unspecified severity, with mood disturbance: Secondary | ICD-10-CM | POA: Diagnosis not present

## 2022-11-27 DIAGNOSIS — Z86718 Personal history of other venous thrombosis and embolism: Secondary | ICD-10-CM | POA: Diagnosis not present

## 2022-11-27 DIAGNOSIS — I10 Essential (primary) hypertension: Secondary | ICD-10-CM | POA: Diagnosis not present

## 2022-11-27 DIAGNOSIS — I959 Hypotension, unspecified: Secondary | ICD-10-CM | POA: Diagnosis not present

## 2022-11-27 DIAGNOSIS — S32501D Unspecified fracture of right pubis, subsequent encounter for fracture with routine healing: Secondary | ICD-10-CM | POA: Diagnosis not present

## 2022-11-27 DIAGNOSIS — Z90710 Acquired absence of both cervix and uterus: Secondary | ICD-10-CM | POA: Diagnosis not present

## 2022-11-27 DIAGNOSIS — L89623 Pressure ulcer of left heel, stage 3: Secondary | ICD-10-CM | POA: Diagnosis not present

## 2022-11-27 DIAGNOSIS — Z993 Dependence on wheelchair: Secondary | ICD-10-CM | POA: Diagnosis not present

## 2022-11-27 DIAGNOSIS — Z7982 Long term (current) use of aspirin: Secondary | ICD-10-CM | POA: Diagnosis not present

## 2022-11-27 DIAGNOSIS — Z8616 Personal history of COVID-19: Secondary | ICD-10-CM | POA: Diagnosis not present

## 2022-11-27 DIAGNOSIS — Z9849 Cataract extraction status, unspecified eye: Secondary | ICD-10-CM | POA: Diagnosis not present

## 2022-11-27 DIAGNOSIS — K589 Irritable bowel syndrome without diarrhea: Secondary | ICD-10-CM | POA: Diagnosis not present

## 2022-11-27 DIAGNOSIS — M797 Fibromyalgia: Secondary | ICD-10-CM | POA: Diagnosis not present

## 2022-11-27 DIAGNOSIS — G629 Polyneuropathy, unspecified: Secondary | ICD-10-CM | POA: Diagnosis not present

## 2022-11-27 DIAGNOSIS — Z87440 Personal history of urinary (tract) infections: Secondary | ICD-10-CM | POA: Diagnosis not present

## 2022-11-27 DIAGNOSIS — I872 Venous insufficiency (chronic) (peripheral): Secondary | ICD-10-CM | POA: Diagnosis not present

## 2022-11-27 DIAGNOSIS — M069 Rheumatoid arthritis, unspecified: Secondary | ICD-10-CM | POA: Diagnosis not present

## 2022-11-27 DIAGNOSIS — Z604 Social exclusion and rejection: Secondary | ICD-10-CM | POA: Diagnosis not present

## 2022-11-27 DIAGNOSIS — M1990 Unspecified osteoarthritis, unspecified site: Secondary | ICD-10-CM | POA: Diagnosis not present

## 2022-11-27 DIAGNOSIS — I2510 Atherosclerotic heart disease of native coronary artery without angina pectoris: Secondary | ICD-10-CM | POA: Diagnosis not present

## 2022-11-27 DIAGNOSIS — W19XXXD Unspecified fall, subsequent encounter: Secondary | ICD-10-CM | POA: Diagnosis not present

## 2022-11-28 ENCOUNTER — Telehealth: Payer: Self-pay | Admitting: Internal Medicine

## 2022-11-28 ENCOUNTER — Telehealth: Payer: Self-pay

## 2022-11-28 NOTE — Telephone Encounter (Unsigned)
Copied from CRM [id number]. Topic: General - Inquiry >> Nov 28, 2022  4:14 PM Clide Dales wrote: Patient's husband called to see if provider would complete a gold DNR form for patient to display in home for first responders. Please advise.

## 2022-11-28 NOTE — Telephone Encounter (Signed)
Verbal given 

## 2022-11-28 NOTE — Telephone Encounter (Signed)
Home Health Verbal Orders - Caller/Agency: Judeth Cornfield with [facility] Callback Number: [phone]  Requesting OT Frequency: 1 x  wk for 3 wks, 1 x every other week for 3 visits  Please assist further

## 2022-11-28 NOTE — Telephone Encounter (Signed)
Spoke with home health and advised them patient needs appointment or go to ER.

## 2022-11-28 NOTE — Telephone Encounter (Signed)
Left message for patient to call and schedule.  Also asked home health to have patient call and schedule an appointment.

## 2022-11-28 NOTE — Telephone Encounter (Signed)
Does she need to be scheduled for an appointment, does she need to go to urgent care or the ER.

## 2022-11-28 NOTE — Telephone Encounter (Signed)
Okay for verbal orders as requested.  She needs to schedule a hospital follow-up with me.

## 2022-11-28 NOTE — Telephone Encounter (Signed)
Copied from CRM [id number]. Topic: General - Inquiry >> Nov 28, 2022 10:36 AM Marlow Baars wrote: Reason for CRM: Stephanie with [facility] reports the patient fell out of bed injuring her back. She was hurting before in her back but it is now hurting worse.

## 2022-11-29 NOTE — Telephone Encounter (Signed)
Attempted to reach patient husband but no answer or voicemail.

## 2022-11-29 NOTE — Telephone Encounter (Signed)
This is something that I can do however I really need to see her for an appointment.  She has had 2 hospitalizations and still not followed up with me.  I am signing home health orders for hospitalization that I have not seen her for.  If she able to come into the office or do I need to see her in her home for a home visit?

## 2022-11-30 DIAGNOSIS — Z86718 Personal history of other venous thrombosis and embolism: Secondary | ICD-10-CM | POA: Diagnosis not present

## 2022-11-30 DIAGNOSIS — S32501D Unspecified fracture of right pubis, subsequent encounter for fracture with routine healing: Secondary | ICD-10-CM | POA: Diagnosis not present

## 2022-11-30 DIAGNOSIS — Z87440 Personal history of urinary (tract) infections: Secondary | ICD-10-CM | POA: Diagnosis not present

## 2022-11-30 DIAGNOSIS — W19XXXD Unspecified fall, subsequent encounter: Secondary | ICD-10-CM | POA: Diagnosis not present

## 2022-11-30 DIAGNOSIS — G629 Polyneuropathy, unspecified: Secondary | ICD-10-CM | POA: Diagnosis not present

## 2022-11-30 DIAGNOSIS — I959 Hypotension, unspecified: Secondary | ICD-10-CM | POA: Diagnosis not present

## 2022-11-30 DIAGNOSIS — Z556 Problems related to health literacy: Secondary | ICD-10-CM | POA: Diagnosis not present

## 2022-11-30 DIAGNOSIS — I872 Venous insufficiency (chronic) (peripheral): Secondary | ICD-10-CM | POA: Diagnosis not present

## 2022-11-30 DIAGNOSIS — D649 Anemia, unspecified: Secondary | ICD-10-CM | POA: Diagnosis not present

## 2022-11-30 DIAGNOSIS — Z90710 Acquired absence of both cervix and uterus: Secondary | ICD-10-CM | POA: Diagnosis not present

## 2022-11-30 DIAGNOSIS — Z8616 Personal history of COVID-19: Secondary | ICD-10-CM | POA: Diagnosis not present

## 2022-11-30 DIAGNOSIS — Z7982 Long term (current) use of aspirin: Secondary | ICD-10-CM | POA: Diagnosis not present

## 2022-11-30 DIAGNOSIS — L89623 Pressure ulcer of left heel, stage 3: Secondary | ICD-10-CM | POA: Diagnosis not present

## 2022-11-30 DIAGNOSIS — M069 Rheumatoid arthritis, unspecified: Secondary | ICD-10-CM | POA: Diagnosis not present

## 2022-11-30 DIAGNOSIS — M797 Fibromyalgia: Secondary | ICD-10-CM | POA: Diagnosis not present

## 2022-11-30 DIAGNOSIS — K589 Irritable bowel syndrome without diarrhea: Secondary | ICD-10-CM | POA: Diagnosis not present

## 2022-11-30 DIAGNOSIS — Z604 Social exclusion and rejection: Secondary | ICD-10-CM | POA: Diagnosis not present

## 2022-11-30 DIAGNOSIS — Z993 Dependence on wheelchair: Secondary | ICD-10-CM | POA: Diagnosis not present

## 2022-11-30 DIAGNOSIS — F0393 Unspecified dementia, unspecified severity, with mood disturbance: Secondary | ICD-10-CM | POA: Diagnosis not present

## 2022-11-30 DIAGNOSIS — I2510 Atherosclerotic heart disease of native coronary artery without angina pectoris: Secondary | ICD-10-CM | POA: Diagnosis not present

## 2022-11-30 DIAGNOSIS — M1990 Unspecified osteoarthritis, unspecified site: Secondary | ICD-10-CM | POA: Diagnosis not present

## 2022-11-30 DIAGNOSIS — Z9849 Cataract extraction status, unspecified eye: Secondary | ICD-10-CM | POA: Diagnosis not present

## 2022-11-30 DIAGNOSIS — I10 Essential (primary) hypertension: Secondary | ICD-10-CM | POA: Diagnosis not present

## 2022-11-30 DIAGNOSIS — F32A Depression, unspecified: Secondary | ICD-10-CM | POA: Diagnosis not present

## 2022-12-01 ENCOUNTER — Telehealth: Payer: Self-pay | Admitting: Internal Medicine

## 2022-12-01 DIAGNOSIS — L89623 Pressure ulcer of left heel, stage 3: Secondary | ICD-10-CM | POA: Diagnosis not present

## 2022-12-01 NOTE — Telephone Encounter (Signed)
Copied from CRM [id number]. Topic: General - Other >> Nov 30, 2022  4:05 PM Everette C wrote: Reason for CRM: Hayley with [facility] has called regarding pre certification of OT orders for the patient  Please contact further when possible

## 2022-12-01 NOTE — Telephone Encounter (Signed)
Home Health Verbal Orders - Caller/Agency: Milinda Pointer [facility]  Callback Number: [phone] Requesting OT/PT/Skilled Nursing/Social Work/Speech Therapy: PT  Frequency:   2w2 1w4

## 2022-12-01 NOTE — Telephone Encounter (Signed)
I am unable to do precertification this time because I have not evaluated this patient.  She is on my schedule for next Wednesday and I will be able to evaluate her at that time and then call for precertification for OT.

## 2022-12-04 ENCOUNTER — Ambulatory Visit: Payer: PPO | Admitting: Podiatry

## 2022-12-04 DIAGNOSIS — J9601 Acute respiratory failure with hypoxia: Secondary | ICD-10-CM | POA: Diagnosis not present

## 2022-12-05 DIAGNOSIS — J9601 Acute respiratory failure with hypoxia: Secondary | ICD-10-CM | POA: Diagnosis not present

## 2022-12-05 DIAGNOSIS — Z993 Dependence on wheelchair: Secondary | ICD-10-CM | POA: Diagnosis not present

## 2022-12-05 DIAGNOSIS — Z556 Problems related to health literacy: Secondary | ICD-10-CM | POA: Diagnosis not present

## 2022-12-05 DIAGNOSIS — G629 Polyneuropathy, unspecified: Secondary | ICD-10-CM | POA: Diagnosis not present

## 2022-12-05 DIAGNOSIS — Z8616 Personal history of COVID-19: Secondary | ICD-10-CM | POA: Diagnosis not present

## 2022-12-05 DIAGNOSIS — M1990 Unspecified osteoarthritis, unspecified site: Secondary | ICD-10-CM | POA: Diagnosis not present

## 2022-12-05 DIAGNOSIS — M797 Fibromyalgia: Secondary | ICD-10-CM | POA: Diagnosis not present

## 2022-12-05 DIAGNOSIS — I2510 Atherosclerotic heart disease of native coronary artery without angina pectoris: Secondary | ICD-10-CM | POA: Diagnosis not present

## 2022-12-05 DIAGNOSIS — L89623 Pressure ulcer of left heel, stage 3: Secondary | ICD-10-CM | POA: Diagnosis not present

## 2022-12-05 DIAGNOSIS — Z9849 Cataract extraction status, unspecified eye: Secondary | ICD-10-CM | POA: Diagnosis not present

## 2022-12-05 DIAGNOSIS — W19XXXD Unspecified fall, subsequent encounter: Secondary | ICD-10-CM | POA: Diagnosis not present

## 2022-12-05 DIAGNOSIS — K589 Irritable bowel syndrome without diarrhea: Secondary | ICD-10-CM | POA: Diagnosis not present

## 2022-12-05 DIAGNOSIS — I10 Essential (primary) hypertension: Secondary | ICD-10-CM | POA: Diagnosis not present

## 2022-12-05 DIAGNOSIS — Z604 Social exclusion and rejection: Secondary | ICD-10-CM | POA: Diagnosis not present

## 2022-12-05 DIAGNOSIS — Z7982 Long term (current) use of aspirin: Secondary | ICD-10-CM | POA: Diagnosis not present

## 2022-12-05 DIAGNOSIS — Z90710 Acquired absence of both cervix and uterus: Secondary | ICD-10-CM | POA: Diagnosis not present

## 2022-12-05 DIAGNOSIS — S32501D Unspecified fracture of right pubis, subsequent encounter for fracture with routine healing: Secondary | ICD-10-CM | POA: Diagnosis not present

## 2022-12-05 DIAGNOSIS — F0393 Unspecified dementia, unspecified severity, with mood disturbance: Secondary | ICD-10-CM | POA: Diagnosis not present

## 2022-12-05 DIAGNOSIS — Z86718 Personal history of other venous thrombosis and embolism: Secondary | ICD-10-CM | POA: Diagnosis not present

## 2022-12-05 DIAGNOSIS — F32A Depression, unspecified: Secondary | ICD-10-CM | POA: Diagnosis not present

## 2022-12-05 DIAGNOSIS — M069 Rheumatoid arthritis, unspecified: Secondary | ICD-10-CM | POA: Diagnosis not present

## 2022-12-05 DIAGNOSIS — I872 Venous insufficiency (chronic) (peripheral): Secondary | ICD-10-CM | POA: Diagnosis not present

## 2022-12-05 DIAGNOSIS — D649 Anemia, unspecified: Secondary | ICD-10-CM | POA: Diagnosis not present

## 2022-12-05 DIAGNOSIS — Z87440 Personal history of urinary (tract) infections: Secondary | ICD-10-CM | POA: Diagnosis not present

## 2022-12-05 DIAGNOSIS — I959 Hypotension, unspecified: Secondary | ICD-10-CM | POA: Diagnosis not present

## 2022-12-06 ENCOUNTER — Other Ambulatory Visit: Payer: PPO | Admitting: Internal Medicine

## 2022-12-06 ENCOUNTER — Telehealth: Payer: Self-pay

## 2022-12-06 VITALS — BP 136/64 | HR 56 | Resp 18

## 2022-12-06 DIAGNOSIS — M159 Polyosteoarthritis, unspecified: Secondary | ICD-10-CM | POA: Diagnosis not present

## 2022-12-06 DIAGNOSIS — M8589 Other specified disorders of bone density and structure, multiple sites: Secondary | ICD-10-CM | POA: Diagnosis not present

## 2022-12-06 DIAGNOSIS — G6289 Other specified polyneuropathies: Secondary | ICD-10-CM | POA: Diagnosis not present

## 2022-12-06 DIAGNOSIS — I25111 Atherosclerotic heart disease of native coronary artery with angina pectoris with documented spasm: Secondary | ICD-10-CM | POA: Diagnosis not present

## 2022-12-06 DIAGNOSIS — F0153 Vascular dementia, unspecified severity, with mood disturbance: Secondary | ICD-10-CM

## 2022-12-06 DIAGNOSIS — E782 Mixed hyperlipidemia: Secondary | ICD-10-CM | POA: Diagnosis not present

## 2022-12-06 DIAGNOSIS — I10 Essential (primary) hypertension: Secondary | ICD-10-CM

## 2022-12-06 DIAGNOSIS — J9601 Acute respiratory failure with hypoxia: Secondary | ICD-10-CM | POA: Diagnosis not present

## 2022-12-06 DIAGNOSIS — L89623 Pressure ulcer of left heel, stage 3: Secondary | ICD-10-CM | POA: Diagnosis not present

## 2022-12-06 DIAGNOSIS — R5381 Other malaise: Secondary | ICD-10-CM

## 2022-12-06 DIAGNOSIS — F01A0 Vascular dementia, mild, without behavioral disturbance, psychotic disturbance, mood disturbance, and anxiety: Secondary | ICD-10-CM

## 2022-12-06 DIAGNOSIS — N1832 Chronic kidney disease, stage 3b: Secondary | ICD-10-CM | POA: Diagnosis not present

## 2022-12-06 DIAGNOSIS — G4733 Obstructive sleep apnea (adult) (pediatric): Secondary | ICD-10-CM | POA: Diagnosis not present

## 2022-12-06 DIAGNOSIS — M797 Fibromyalgia: Secondary | ICD-10-CM

## 2022-12-06 DIAGNOSIS — I252 Old myocardial infarction: Secondary | ICD-10-CM

## 2022-12-06 DIAGNOSIS — Z8673 Personal history of transient ischemic attack (TIA), and cerebral infarction without residual deficits: Secondary | ICD-10-CM | POA: Diagnosis not present

## 2022-12-06 MED ORDER — BUPROPION HCL ER (XL) 150 MG PO TB24: 150.0000 mg | ORAL_TABLET | Freq: Every day | ORAL | 1 refills | Status: DC

## 2022-12-06 NOTE — Telephone Encounter (Unsigned)
Copied from CRM [id number]. Topic: General - Other >> Dec 06, 2022  2:38 PM Dominique A wrote: Reason for CRM: Pt husband states that Rene Kocher was just helping his wife with some paper work and the wrong address is on the paper work. The correct address should be [address], [place], [place] [address].  Please advise

## 2022-12-06 NOTE — Progress Notes (Unsigned)
Subjective:    Patient ID: Kristin [name], female    DOB: 1941-01-12, 82 y.o.   MRN: [id number]  HPI  Patient seen for home visit today for follow-up of chronic conditions.  HTN: Her BP today is.  She is taking Diltiazem and Clonidine as prescribed.  ECG from 09/2022 reviewed.  She follows with cardiology.  HLD with [name] status post MI/Stroke, Aortic Atherosclerosis: Her last LDL was 104, triglycerides 109, 07/2022.  She is not currently taking any cholesterol-lowering medication.  She is not taking any Aspirin.  She tries to consume low-fat diet.  OA/Fibromyalgia: Mainly in her hands, back and knees.  She is taking Duloxetine and Gabapentin with some relief of symptoms.  She follows with rheumatology.  OSA: She averages 7 hours of sleep per night without the use of her CPAP.  There is no sleep study on file.  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  She needs assistance with some ADLs.  She is taking Donepezil and Memantine as prescribed.  She follows with neurology.  Osteopenia: She is taking Calcium and vitamin D OTC.  She does not get much weightbearing exercise.  Bone density from 05/2022 reviewed.  Insomnia: She has difficulty.  She is taking Trazodone as needed with some relief of symptoms.  There is no sleep study on file.  Review of Systems     Past Medical History:  Diagnosis Date   Allergy    Arthritis    recent falls -aug 2010   [name] (coronary artery disease)    mi-1996   Complication of anesthesia    pt states takes a long time to wake her wake up   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) 08/27/2015   Cough    no fever    Degeneration of lumbar or lumbosacral intervertebral disc    Dizziness    r/t meds   DVT (deep venous thrombosis) (HCC)    Fibromyalgia    History of shingles    [name] (irritable bowel syndrome)    takes OTC Vear Clock colon health   Joint pain    Joint  swelling    Myalgia and myositis, unspecified    Obstructive sleep apnea (adult) (pediatric)    Old myocardial infarction 1996   Osteoarthritis    Osteoarthrosis, unspecified whether generalized or localized, unspecified site    Psoriatic arthritis (HCC)    Rheumatoid arthritis (HCC)    Rosacea    Seasonal allergies    Stroke [facility])    ischaemic microvascular disease   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HCC)    1991 , bleed and dizziness    Unspecified essential hypertension    takes Diltiazem,Metoprolol,and Losartan daily   UTI (lower urinary tract infection)    frequent=last one was winter of 2014    Current Outpatient Medications  Medication Sig Dispense Refill   acetaminophen (TYLENOL) 325 MG tablet Take 975 mg by mouth 3 (three) times daily.     aspirin 81 MG chewable tablet Chew 81 mg by mouth daily.     Calcium Carbonate-Vitamin D (CALTRATE 600+D PO) Take 1 tablet by mouth daily at 12 noon.     cloNIDine (CATAPRES) 0.1 MG tablet Take 0.1 mg by mouth 3 (three) times daily.     Cranberry 400 MG TABS Take 1 tablet by mouth daily.     diltiazem (CARDIZEM CD) 240 MG 24 hr capsule TAKE 1 CAPSULE BY MOUTH DAILY 90 capsule  0   donepezil (ARICEPT) 10 MG tablet Take 10 mg by mouth at bedtime.     DULoxetine (CYMBALTA) 30 MG capsule Take 1 capsule (30 mg total) by mouth 2 (two) times daily. 30 capsule 0   ferrous sulfate 325 (65 FE) MG tablet Take 325 mg by mouth daily with breakfast.     gabapentin (NEURONTIN) 400 MG capsule Take 400 mg by mouth 2 (two) times daily.     Lactobacillus Rhamnosus, GG, (CULTURELLE PO) Take 1 capsule by mouth daily.     memantine (NAMENDA) 10 MG tablet TAKE 1 TABLET BY MOUTH TWICE DAILY 180 tablet 0   Multiple Vitamin (MULTIVITAMIN) tablet Take 1 tablet by mouth daily.     tamsulosin (FLOMAX) 0.4 MG CAPS capsule Take 0.4 mg by mouth at bedtime.     traZODone (DESYREL) 50 MG tablet Take 0.5 tablets (25 mg total) by mouth at bedtime as needed  for sleep. 15 tablet 0   No current facility-administered medications for this visit.    Allergies  Allergen Reactions   Angiotensin Receptor Blockers Anaphylaxis    Fatigue & Dizziness   Other Anaphylaxis    Other reaction(s): Other (See Comments) Dizziness, rash Fatigue & Dizziness   Hydralazine Other (See Comments)    Unknown   Latex     REACTION: Rash, blisters, breathing probs.   Losartan     Dizziness    Methotrexate Derivatives     Dizziness, rash    Spironolactone     Dizziness    Penicillins Hives and Rash    Family History  Problem Relation Age of Onset   Colon cancer Father    Heart attack Mother    Coronary artery disease Mother    Osteoporosis Mother    Heart disease Mother    Heart failure Mother    GER disease Sister    Skin cancer Sister    Hypertension Sister    Osteopenia Sister    GER disease Sister    Breast cancer Paternal Aunt        age 39   Heart failure Maternal Grandmother    Heart failure Maternal Grandfather     Social History   Socioeconomic History   Marital status: Married    Spouse name: Scientist, physiological   Number of children: 2   Years of education: BSN   Highest education level: Not on file  Occupational History   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 She is DNR, [place] is husband Elenore Paddy , then son Aneta Mins       10/22/12-cardiac work up  found the reason for her dizziness,  she needs no neurological follow up for that.  her enthusiasm for CPAP remains limited and is explained by her resiudal high  AHI after she finally reached compliance ,[place]worth 13 points.     95% pressure is 11.7 cm , AHI  20 .7- she does not benefit from CPAP . She is allowed to d/c the machine and we need to concerntrate on her dizziness and facial right droop. ordered MRI brain repeat.    Eye exam next week. PT at [facility]. MRI brain in April- per patients request order next month.          Social Determinants of Health   Financial Resource Strain: Low Risk  (06/13/2021)   Overall Financial Resource Strain (CARDIA)    Difficulty of Paying Living Expenses: Not hard at all  Food Insecurity: No Food Insecurity (08/24/2022)   Hunger Vital Sign    Worried About Running Out of Food in the Last Year: Never true    Ran Out of Food in the Last Year: Never true  Transportation Needs: No Transportation Needs (08/24/2022)   PRAPARE - Administrator, Civil Service (Medical): No    Lack of Transportation (Non-Medical): No  Physical Activity: Insufficiently Active (06/13/2021)   Exercise Vital Sign    Days of Exercise per Week: 3 days    Minutes of Exercise per Session: 20 min  Stress: No Stress Concern Present (01/11/2022)   [employer] of Occupational Health - Occupational Stress Questionnaire    Feeling of Stress : Not at all  Social Connections: Moderately Isolated (06/13/2021)   Social Connection and Isolation Panel [NHANES]    Frequency of Communication with Friends and Family: More than three times a week    Frequency of Social Gatherings with Friends and Family: More than three times a week    Attends Religious Services: Never    Database administrator or Organizations: No    Attends Banker Meetings: Never    Marital Status: Married  Catering manager Violence: Not At Risk (08/24/2022)   Humiliation, Afraid, Rape, and Kick  questionnaire    Fear of Current or Ex-Partner: No    Emotionally Abused: No    Physically Abused: No    Sexually Abused: No     Constitutional: Denies fever, malaise, fatigue, headache or abrupt weight changes.  HEENT: Denies eye pain, eye redness, ear pain, ringing in the ears, wax buildup, runny nose, nasal congestion, bloody nose, or sore throat. Respiratory: Denies difficulty breathing, shortness of breath, cough or sputum production.   Cardiovascular: Denies chest pain, chest tightness, palpitations or swelling in the hands or feet.  Gastrointestinal: Denies abdominal pain, bloating, constipation, diarrhea or blood in the stool.  GU: Denies urgency, frequency, pain with urination, burning sensation, blood in urine, odor or discharge. Musculoskeletal: Patient reports chronic joint and muscle pain.  Denies decrease in range of motion, difficulty with gait, or joint  swelling.  Skin: Denies redness, rashes, lesions or ulcercations.  Neurological: Patient reports insomnia, difficulty with memory.  Denies dizziness, difficulty with speech or problems with balance and coordination.  Psych: Denies anxiety, depression, SI/HI.  No other specific complaints in a complete review of systems (except as listed in HPI above).  Objective:   Physical Exam   LMP 08/14/1989  Wt Readings from Last 3 Encounters:  10/04/22 131 lb 8 oz (59.6 kg)  08/23/22 147 lb 11.3 oz (67 kg)  07/24/22 148 lb (67.1 kg)    General: Appears their stated age, well developed, well nourished in NAD. Skin: Warm, dry and intact. No rashes, lesions or ulcerations noted. HEENT: Head: normal shape and  size; Eyes: sclera white, no icterus, conjunctiva pink, PERRLA and EOMs intact; Ears: Tm's gray and intact, normal light reflex; Nose: mucosa pink and moist, septum midline; Throat/Mouth: Teeth present, mucosa pink and moist, no exudate, lesions or ulcerations noted.  Neck:  Neck supple, trachea midline. No masses, lumps or  thyromegaly present.  Cardiovascular: Normal rate and rhythm. S1,S2 noted.  No murmur, rubs or gallops noted. No JVD or BLE edema. No carotid bruits noted. Pulmonary/Chest: Normal effort and positive vesicular breath sounds. No respiratory distress. No wheezes, rales or ronchi noted.  Abdomen: Soft and nontender. Normal bowel sounds. No distention or masses noted. Liver, spleen and kidneys non palpable. Musculoskeletal: Normal range of motion. No signs of joint swelling. No difficulty with gait.  Neurological: Alert and oriented. Cranial nerves II-XII grossly intact. Coordination normal.  Psychiatric: Mood and affect normal. Behavior is normal. Judgment and thought content normal.    BMET    Component Value Date/Time   NA 139 10/09/2022 0857   NA 136 12/29/2016 1036   K 3.3 (L) 10/09/2022 0857   CL 106 10/09/2022 0857   CO2 25 10/09/2022 0857   GLUCOSE 104 (H) 10/09/2022 0857   BUN 14 10/09/2022 0857   BUN 58 (H) 12/29/2016 1036   CREATININE 0.66 10/09/2022 0857   CREATININE 1.16 (H) 07/24/2022 0903   CALCIUM 8.6 (L) 10/09/2022 0857   GFRNONAA >60 10/09/2022 0857   GFRNONAA 43 (L) 11/03/2019 0941   GFRAA 49 (L) 11/03/2019 0941    Lipid Panel     Component Value Date/Time   CHOL 207 (H) 07/24/2022 0903   TRIG 109 07/24/2022 0903   HDL 82 07/24/2022 0903   CHOLHDL 2.5 07/24/2022 0903   VLDL 21.2 02/21/2018 1035   LDLCALC 104 (H) 07/24/2022 0903    CBC    Component Value Date/Time   WBC 7.6 10/09/2022 0857   RBC 3.32 (L) 10/09/2022 0857   HGB 10.4 (L) 10/09/2022 0857   HGB 12.1 03/22/2009 1331   HCT 31.5 (L) 10/09/2022 0857   HCT 36.1 03/22/2009 1331   PLT 323 10/09/2022 0857   PLT 257 03/22/2009 1331   MCV 94.9 10/09/2022 0857   MCV 97.5 03/22/2009 1331   MCH 31.3 10/09/2022 0857   MCHC 33.0 10/09/2022 0857   RDW 14.6 10/09/2022 0857   RDW 15.9 (H) 03/22/2009 1331   LYMPHSABS 0.9 10/09/2022 0857   LYMPHSABS 1.6 03/22/2009 1331   MONOABS 0.6 10/09/2022 0857    MONOABS 0.6 03/22/2009 1331   EOSABS 0.2 10/09/2022 0857   EOSABS 0.1 03/22/2009 1331   BASOSABS 0.1 10/09/2022 0857   BASOSABS 0.1 03/22/2009 1331    Hgb A1C No results found for: "HGBA1C"         Assessment & Plan:    RTC in 6 months for annual exam Nicki Reaper, NP

## 2022-12-07 ENCOUNTER — Other Ambulatory Visit: Payer: Self-pay

## 2022-12-07 ENCOUNTER — Encounter: Payer: Self-pay | Admitting: Internal Medicine

## 2022-12-07 DIAGNOSIS — J9601 Acute respiratory failure with hypoxia: Secondary | ICD-10-CM | POA: Diagnosis not present

## 2022-12-07 NOTE — Assessment & Plan Note (Signed)
Controlled on diltiazem and clonidine

## 2022-12-07 NOTE — Assessment & Plan Note (Addendum)
Deteriorated Continue donepezil and memantine We will add Wellbutrin

## 2022-12-07 NOTE — Assessment & Plan Note (Signed)
She reports she is having chest pain but has been has made her DNR Not currently on cholesterol-lowering medication or aspirin at this time

## 2022-12-07 NOTE — Assessment & Plan Note (Signed)
She reports she is having chest pain but has been has made her DNR Not currently on cholesterol-lowering medication or aspirin at this time 

## 2022-12-07 NOTE — Telephone Encounter (Signed)
I had realized that the address in the system was wrong.  We just need to change it in the system.  Which paper is he referring to?  The DNR form does not need her address on it, that only has our office address.  The other 2 items were prescriptions for a bedside commode and a hospital bed.  Does he just need those to reprinted with the correct address after it is changed in the system?  Please advise him that I have also sent in an antidepressant that I want her to start taking once daily.  I have also placed referrals for palliative care and our social worker to reach out to him to help him with resources since he is essentially taking care of her alone.

## 2022-12-07 NOTE — Assessment & Plan Note (Signed)
Continue duloxetine

## 2022-12-07 NOTE — Assessment & Plan Note (Signed)
Noncompliant with CPAP use. ?

## 2022-12-07 NOTE — Assessment & Plan Note (Signed)
Not weightbearing

## 2022-12-07 NOTE — Patient Instructions (Signed)

## 2022-12-08 DIAGNOSIS — M069 Rheumatoid arthritis, unspecified: Secondary | ICD-10-CM | POA: Diagnosis not present

## 2022-12-08 DIAGNOSIS — Z86718 Personal history of other venous thrombosis and embolism: Secondary | ICD-10-CM | POA: Diagnosis not present

## 2022-12-08 DIAGNOSIS — M1990 Unspecified osteoarthritis, unspecified site: Secondary | ICD-10-CM | POA: Diagnosis not present

## 2022-12-08 DIAGNOSIS — Z7982 Long term (current) use of aspirin: Secondary | ICD-10-CM | POA: Diagnosis not present

## 2022-12-08 DIAGNOSIS — L89623 Pressure ulcer of left heel, stage 3: Secondary | ICD-10-CM | POA: Diagnosis not present

## 2022-12-08 DIAGNOSIS — K589 Irritable bowel syndrome without diarrhea: Secondary | ICD-10-CM | POA: Diagnosis not present

## 2022-12-08 DIAGNOSIS — Z556 Problems related to health literacy: Secondary | ICD-10-CM | POA: Diagnosis not present

## 2022-12-08 DIAGNOSIS — D649 Anemia, unspecified: Secondary | ICD-10-CM | POA: Diagnosis not present

## 2022-12-08 DIAGNOSIS — Z87440 Personal history of urinary (tract) infections: Secondary | ICD-10-CM | POA: Diagnosis not present

## 2022-12-08 DIAGNOSIS — Z8616 Personal history of COVID-19: Secondary | ICD-10-CM | POA: Diagnosis not present

## 2022-12-08 DIAGNOSIS — I959 Hypotension, unspecified: Secondary | ICD-10-CM | POA: Diagnosis not present

## 2022-12-08 DIAGNOSIS — W19XXXD Unspecified fall, subsequent encounter: Secondary | ICD-10-CM | POA: Diagnosis not present

## 2022-12-08 DIAGNOSIS — Z993 Dependence on wheelchair: Secondary | ICD-10-CM | POA: Diagnosis not present

## 2022-12-08 DIAGNOSIS — Z90710 Acquired absence of both cervix and uterus: Secondary | ICD-10-CM | POA: Diagnosis not present

## 2022-12-08 DIAGNOSIS — I872 Venous insufficiency (chronic) (peripheral): Secondary | ICD-10-CM | POA: Diagnosis not present

## 2022-12-08 DIAGNOSIS — F0393 Unspecified dementia, unspecified severity, with mood disturbance: Secondary | ICD-10-CM | POA: Diagnosis not present

## 2022-12-08 DIAGNOSIS — F32A Depression, unspecified: Secondary | ICD-10-CM | POA: Diagnosis not present

## 2022-12-08 DIAGNOSIS — I10 Essential (primary) hypertension: Secondary | ICD-10-CM | POA: Diagnosis not present

## 2022-12-08 DIAGNOSIS — Z9849 Cataract extraction status, unspecified eye: Secondary | ICD-10-CM | POA: Diagnosis not present

## 2022-12-08 DIAGNOSIS — Z604 Social exclusion and rejection: Secondary | ICD-10-CM | POA: Diagnosis not present

## 2022-12-08 DIAGNOSIS — G629 Polyneuropathy, unspecified: Secondary | ICD-10-CM | POA: Diagnosis not present

## 2022-12-08 DIAGNOSIS — M797 Fibromyalgia: Secondary | ICD-10-CM | POA: Diagnosis not present

## 2022-12-08 DIAGNOSIS — S32501D Unspecified fracture of right pubis, subsequent encounter for fracture with routine healing: Secondary | ICD-10-CM | POA: Diagnosis not present

## 2022-12-08 DIAGNOSIS — I2510 Atherosclerotic heart disease of native coronary artery without angina pectoris: Secondary | ICD-10-CM | POA: Diagnosis not present

## 2022-12-11 ENCOUNTER — Telehealth: Payer: Self-pay | Admitting: Physician Assistant

## 2022-12-11 ENCOUNTER — Telehealth: Payer: Self-pay

## 2022-12-11 ENCOUNTER — Telehealth: Payer: Self-pay | Admitting: Internal Medicine

## 2022-12-11 ENCOUNTER — Ambulatory Visit: Payer: Self-pay | Admitting: *Deleted

## 2022-12-11 NOTE — Telephone Encounter (Signed)
I am aware of her deteriorated condition.  We are getting palliative care involved.  Her husband wants to keep her at home and does not want to move her to a SNF.  She is a DNR.  I have increased her duloxetine to 2 times daily to help with her pain.

## 2022-12-11 NOTE — Telephone Encounter (Signed)
Typically hospital bed is a standard size.  If there is not enough room, he may not be able to accommodate a hospital bed.

## 2022-12-11 NOTE — Telephone Encounter (Signed)
Pt's husband is calling appreciative for the order for the hospital bed. Space in the home is limited. Calling to see if a smaller bed can be ordered.[phone]

## 2022-12-11 NOTE — Telephone Encounter (Signed)
Summary: Pain   Pt is in excruciating pain, pt's home health PT nurse called to report this. Stated that she was leaving to tend to her other patient appts but that the husband was still with the patient         Reason for Disposition  [1] SEVERE back pain (e.g., excruciating, unable to do any normal activities) AND [2] not improved 2 hours after pain medicine  Answer Assessment - Initial Assessment Questions 1. ONSET: "When did the pain begin?"      Chronic leg and back pain 2. LOCATION: "Where does it hurt?" (upper, mid or lower back)     Back and pain 3. SEVERITY: "How bad is the pain?"  (e.g., Scale 1-10; mild, moderate, or severe)   - MILD (1-3): Doesn't interfere with normal activities.    - MODERATE (4-7): Interferes with normal activities or awakens from sleep.    - SEVERE (8-10): Excruciating pain, unable to do any normal activities.      severe 4. PATTERN: "Is the pain constant?" (e.g., yes, no; constant, intermittent)      chronic 5. RADIATION: "Does the pain shoot into your legs or somewhere else?"     na 6. CAUSE:  "What do you think is causing the back pain?"      Chronic back and leg pain 7. BACK OVERUSE:  "Any recent lifting of heavy objects, strenuous work or exercise?"     Unable to reposition with aid present- patient cried out in PT moving her legs 8. MEDICINES: "What have you taken so far for the pain?" (e.g., nothing, acetaminophen, NSAIDS)     See list 9. NEUROLOGIC SYMPTOMS: "Do you have any weakness, numbness, or problems with bowel/bladder control?"    Weakness/pain 10. OTHER SYMPTOMS: "Do you have any other symptoms?" (e.g., fever, abdomen pain, burning with urination, blood in urine)       Patient unable to stand- Pt has to help her sit  Protocols used: Back Pain-A-AH

## 2022-12-11 NOTE — Telephone Encounter (Signed)
She is scheduled for a voiding trial with me this Friday.  This morning, her husband contacted our clinic to let us know that her mobility is impaired such that he is unsure if he will be able to get her to clinic.  Ultimately, he stated that her Foley catheter is facilitating her care as he is unsure if he would be able to change her diaper by himself.  Per chart review, her PCP has recently ordered her hospital bed and noted her deteriorating status.  There is mention of getting palliative care involved.  Based on this, I recommend canceling upcoming voiding trial and planning to keep a chronic indwelling Foley catheter.  I spoke with [facility] to arrange monthly in-home Foley catheter exchanges as part of her home health services.  They will call me back to confirm verbal orders.  They may follow-up with [place] in clinic as needed.

## 2022-12-11 NOTE — Progress Notes (Signed)
  Chronic Care Management   Note  12/11/2022 Name: Kristin [name] MRN: [id number] DOB: 08/25/1940  Kristin [name] is a 82 y.o. year old female who is a primary care patient of Lorre Munroe, NP. I reached out to [name] by phone today in response to a referral sent by Ms. [name]'s PCP.  [name] was given information about Chronic Care Management services today including:  CCM service includes personalized support from designated clinical staff supervised by the physician, including individualized plan of care and coordination with other care providers 24/7 contact phone numbers for assistance for urgent and routine care needs. Service will only be billed when office clinical staff spend 20 minutes or more in a month to coordinate care. Only one practitioner may furnish and bill the service in a calendar month. The patient may stop CCM services at amy time (effective at the end of the month) by phone call to the office staff. The patient will be responsible for cost sharing (co-pay) or up to 20% of the service fee (after annual deductible is met)  Ms. [name]  agreedto scheduling an appointment with the CCM RN Case Manager   Follow up plan: Patient agreed to scheduled appointment with RN Case Manager on 12/14/2022(date/time).   Penne Lash, RMA Care Guide [facility]  [place], [place] [address] Direct Dial: [phone] [email or web address]

## 2022-12-11 NOTE — Telephone Encounter (Signed)
Copied from CRM [id number]. Topic: General - Other >> Dec 11, 2022  2:48 PM Franchot Heidelberg wrote: Best contact: [phone] Marlaine Hind from [facility] Pt   Needs to speak to PCP about Pt's condition. She is unable to turn on her own and the pt is in excruciating pain in her legs. Pt is unable to participate in PT exercises.

## 2022-12-11 NOTE — Telephone Encounter (Signed)
Call to patient home- spoke with husband: Chief Complaint: uncontrolled pain- pain in back and legs- PT moved legs today and patient cried out in pain Symptoms: patient has chronic pain- but she cries out in pain with PT and repositioning in bed- husband is not sure what to do Frequency: chronic  Disposition: [] ED /[] Urgent Care (no appt availability in office) / [] Appointment(In office/virtual)/ []  [facility]/ [] Home Care/ [] Refused Recommended Disposition /[] [facility]/ [x]  Follow-up with PCP Additional Notes: Call to husband of patient- prompted by PT call regarding patient pain. Husband states he is worn out and he sounds very tired. Patient cries out in pain with repositioning by home health and when PT moves her legs. Husband is not sure what patient is using for pain- he did have a medication question- ut could not find note while speaking on phone- I advised him to call back.

## 2022-12-11 NOTE — Telephone Encounter (Signed)
She is on duloxetine.  I want her to start taking this 2 times daily.  This should help with her pain.

## 2022-12-12 NOTE — Telephone Encounter (Signed)
Pt's husband advised. He is going to increase Duloxetine to twice a day.   Thanks,   -Vernona Rieger

## 2022-12-12 NOTE — Telephone Encounter (Signed)
Pt's husband advised.   Thanks,   -Vernona Rieger

## 2022-12-12 NOTE — Telephone Encounter (Signed)
Pt's husband advised.  He is going to keep the hospital bed.  Thanks,   -Vernona Rieger

## 2022-12-14 ENCOUNTER — Telehealth: Payer: PPO

## 2022-12-14 ENCOUNTER — Ambulatory Visit (INDEPENDENT_AMBULATORY_CARE_PROVIDER_SITE_OTHER)

## 2022-12-14 DIAGNOSIS — I872 Venous insufficiency (chronic) (peripheral): Secondary | ICD-10-CM | POA: Diagnosis not present

## 2022-12-14 DIAGNOSIS — Z993 Dependence on wheelchair: Secondary | ICD-10-CM | POA: Diagnosis not present

## 2022-12-14 DIAGNOSIS — M797 Fibromyalgia: Secondary | ICD-10-CM | POA: Diagnosis not present

## 2022-12-14 DIAGNOSIS — Z556 Problems related to health literacy: Secondary | ICD-10-CM | POA: Diagnosis not present

## 2022-12-14 DIAGNOSIS — I10 Essential (primary) hypertension: Secondary | ICD-10-CM | POA: Diagnosis not present

## 2022-12-14 DIAGNOSIS — Z86718 Personal history of other venous thrombosis and embolism: Secondary | ICD-10-CM | POA: Diagnosis not present

## 2022-12-14 DIAGNOSIS — M069 Rheumatoid arthritis, unspecified: Secondary | ICD-10-CM | POA: Diagnosis not present

## 2022-12-14 DIAGNOSIS — Z8616 Personal history of COVID-19: Secondary | ICD-10-CM | POA: Diagnosis not present

## 2022-12-14 DIAGNOSIS — G629 Polyneuropathy, unspecified: Secondary | ICD-10-CM | POA: Diagnosis not present

## 2022-12-14 DIAGNOSIS — D649 Anemia, unspecified: Secondary | ICD-10-CM | POA: Diagnosis not present

## 2022-12-14 DIAGNOSIS — M1990 Unspecified osteoarthritis, unspecified site: Secondary | ICD-10-CM | POA: Diagnosis not present

## 2022-12-14 DIAGNOSIS — I959 Hypotension, unspecified: Secondary | ICD-10-CM | POA: Diagnosis not present

## 2022-12-14 DIAGNOSIS — Z90710 Acquired absence of both cervix and uterus: Secondary | ICD-10-CM | POA: Diagnosis not present

## 2022-12-14 DIAGNOSIS — F0393 Unspecified dementia, unspecified severity, with mood disturbance: Secondary | ICD-10-CM | POA: Diagnosis not present

## 2022-12-14 DIAGNOSIS — F32A Depression, unspecified: Secondary | ICD-10-CM | POA: Diagnosis not present

## 2022-12-14 DIAGNOSIS — Z604 Social exclusion and rejection: Secondary | ICD-10-CM | POA: Diagnosis not present

## 2022-12-14 DIAGNOSIS — K589 Irritable bowel syndrome without diarrhea: Secondary | ICD-10-CM | POA: Diagnosis not present

## 2022-12-14 DIAGNOSIS — Z9849 Cataract extraction status, unspecified eye: Secondary | ICD-10-CM | POA: Diagnosis not present

## 2022-12-14 DIAGNOSIS — S32501D Unspecified fracture of right pubis, subsequent encounter for fracture with routine healing: Secondary | ICD-10-CM | POA: Diagnosis not present

## 2022-12-14 DIAGNOSIS — Z87440 Personal history of urinary (tract) infections: Secondary | ICD-10-CM | POA: Diagnosis not present

## 2022-12-14 DIAGNOSIS — R5381 Other malaise: Secondary | ICD-10-CM

## 2022-12-14 DIAGNOSIS — L89623 Pressure ulcer of left heel, stage 3: Secondary | ICD-10-CM | POA: Diagnosis not present

## 2022-12-14 DIAGNOSIS — F01A0 Vascular dementia, mild, without behavioral disturbance, psychotic disturbance, mood disturbance, and anxiety: Secondary | ICD-10-CM

## 2022-12-14 DIAGNOSIS — I2510 Atherosclerotic heart disease of native coronary artery without angina pectoris: Secondary | ICD-10-CM | POA: Diagnosis not present

## 2022-12-14 DIAGNOSIS — W19XXXD Unspecified fall, subsequent encounter: Secondary | ICD-10-CM | POA: Diagnosis not present

## 2022-12-14 DIAGNOSIS — Z7982 Long term (current) use of aspirin: Secondary | ICD-10-CM | POA: Diagnosis not present

## 2022-12-14 NOTE — Patient Instructions (Signed)
Please call the care guide team at [phone] if you need to cancel or reschedule your appointment.   If you are experiencing a Mental Health or Behavioral Health Crisis or need someone to talk to, please call the Suicide and Crisis Lifeline: 988 call the [place] National Suicide Prevention Lifeline: 217 044 5840 or TTY: 412-821-3955 TTY (256) 863-0532) to talk to a trained counselor call 1-800-273-TALK (toll free, 24 hour hotline)   Following is a copy of the CCM Program Consent:  CCM service includes personalized support from designated clinical staff supervised by the physician, including individualized plan of care and coordination with other care providers 24/7 contact phone numbers for assistance for urgent and routine care needs. Service will only be billed when office clinical staff spend 20 minutes or more in a month to coordinate care. Only one practitioner may furnish and bill the service in a calendar month. The patient may stop CCM services at amy time (effective at the end of the month) by phone call to the office staff. The patient will be responsible for cost sharing (co-pay) or up to 20% of the service fee (after annual deductible is met)  Following is a copy of your full provider care plan:   Goals Addressed             This Visit's Progress    CCM Expected Outcome:  Monitor, Self-Manage and Reduce Symptoms of: Dementia with decline in functional status/caregiver stress       Current Barriers:  Knowledge Deficits related to resources for help in the home due to the patient with decline in functional status and being bed bound and advanced dementia Care Coordination needs related to support and help in the home in a patient with advanced dementia with functional decline Chronic Disease Management support and education needs related to effective management of patient with dementia and functional decline Lacks caregiver support.   Planned Interventions: Evaluation of  current treatment plan related to advanced dementia and functional decline in the patient  and patient's adherence to plan as established by provider Advised patient to be expecting a call from [facility] services to be able to come out sooner to make a home visit to assess and evaluate the patient. Spoke to the patients husband and DPR. Provided education to patient re: talking to the pcp about the patients significant decline from last week. Education on talking to [facility] to see if they can come out sooner to evaluate the patient and start services.  Reviewed medications with patient and discussed the patient is not eating well and only taking some medications.  Collaborated with pcp and LCSW regarding the patient decline in health and well being. After collaboration with pcp the RNCM called [facility] and spoke with Amber to see about getting an earlier appointment for them to come out to the home to assist. The number is [phone]. Social Work referral for consult on more help in the home. Did let the LCSW know that Hospice services may be working with the patient and would keep the LCSW posted on changes.  Discussed plans with patient for ongoing care management follow up and provided patient with direct contact information for care management team Advised patient to discuss changes in dementia, declining further, new needs or concerns with provider Screening for signs and symptoms of depression related to chronic disease state  Assessed social determinant of health barriers Spoke to [facility] with [facility] who states they can get out sooner to see the patient. Amber called the RNCM back  and has been unable to get the patients husband. The direct number provided of [phone]. The RNCM was able to call the patients husband back and connect with him. Provided the number to [facility] and [name] will call them back as soon as he got off of the phone with the [facility]. Education and support given.    Symptom Management: Call provider office for new concerns or questions   Follow Up Plan: Telephone follow up appointment with care management team member scheduled for: 12-18-2022 at 345 pm       CCM Expected Outcome:  Monitor, Self-Manage, and Reduce Symptoms of Hypertension       Current Barriers:  Chronic Disease Management support and education needs related to effective management of HTN Lacks caregiver support.   Planned Interventions: Evaluation of current treatment plan related to hypertension self management and patient's adherence to plan as established by provider;   Provided education to patient re: stroke prevention, s/s of heart attack and stroke; Reviewed prescribed diet heart healthy diet- the patients husband states she is not eating well and she is only taking bites if that.  Reviewed medications with patient and discussed importance of compliance;  Discussed plans with patient for ongoing care management follow up and provided patient with direct contact information for care management team; Advised patient to discuss changes in her condition, questions or concerns with provider; Provided education on prescribed diet heart healthy, patient is at risk for high aspiration;  Discussed complications of poorly controlled blood pressure such as heart disease, stroke, circulatory complications, vision complications, kidney impairment, sexual dysfunction;  Screening for signs and symptoms of depression related to chronic disease state;  Assessed social determinant of health barriers;   Symptom Management: Take medications as prescribed   Attend all scheduled provider appointments Call provider office for new concerns or questions  call the Suicide and Crisis Lifeline: 988 call the [place] National Suicide Prevention Lifeline: 929 298 4272 or TTY: 970-771-2765 TTY 204-322-6959) to talk to a trained counselor call 1-800-273-TALK (toll free, 24 hour hotline) if experiencing a  Mental Health or Behavioral Health Crisis  report new symptoms to your doctor  Follow Up Plan: Telephone follow up appointment with care management team member scheduled for: 12-18-2022 at 345 pm          Patient verbalizes understanding of instructions and care plan provided today and agrees to view in MyChart. Active MyChart status and patient understanding of how to access instructions and care plan via MyChart confirmed with patient.  Telephone follow up appointment with care management team member scheduled for: 12-18-2022 at 345 pm

## 2022-12-14 NOTE — Plan of Care (Signed)
Chronic Care Management Provider Comprehensive Care Plan    12/14/2022 Name: Kristin [name] MRN: [id number] DOB: 1940/11/08  Referral to Chronic Care Management (CCM) services was placed by Provider:  Nicki Reaper, NP on Date: 12-07-2022.  Chronic Condition 1: Dementia with functional decline Provider Assessment and Plan Decline in Functional Status, Constipation:   Enema given by this provider Advised her to start MiraLAX and Colace daily She would benefit from a bedside commode, DME order written She would benefit from hospital bed to help aid in repositioning, DME order written Referral to palliative care for further evaluation Referral to CCM social work to help aid with community resources and spousal caregiver support I believe this patient really should be in a skilled nursing facility however husband reports he cannot afford that and has no other option but to keep her at home Deteriorated Continue donepezil and memantine We will add Wellbutrin     Expected Outcome/Goals Addressed This Visit (Provider CCM goals/Provider Assessment and plan   CCM (Dementia with functional decline)  EXPECTED OUTCOME:  MONITOR,SELF- MANAGE AND REDUCE SYMPTOMS OF Dementia with functional decline   Symptom Management Condition 1: Call provider office for new concerns or questions   Chronic Condition 2: HTN  Provider Assessment and Plan Controlled on diltiazem and clonidine    Expected Outcome/Goals Addressed This Visit (Provider CCM goals/Provider Assessment and plan   CCM (HYPERTENSION)  EXPECTED OUTCOME:  MONITOR,SELF- MANAGE AND REDUCE SYMPTOMS OF HYPERTENSION  Symptom Management Condition 2: Call provider office for new concerns or questions  report new symptoms to your doctor  Problem List Patient Active Problem List   Diagnosis Date Noted   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HCC) 08/24/2022   Peripheral neuropathy 08/24/2022   Essential hypertension 08/24/2022    Osteopenia 01/06/2022   History of stroke 01/03/2021   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) 08/27/2015   Osteoarthritis 08/10/2011   MYOCARDIAL INFARCTION, HX OF 08/01/2010   Fibromyalgia 05/20/2008   Hyperlipidemia 05/29/2007   OBSTRUCTIVE SLEEP APNEA 05/29/2007    Medication Management  Current Outpatient Medications:    acetaminophen (TYLENOL) 325 MG tablet, Take 975 mg by mouth 3 (three) times daily., Disp: , Rfl:    aspirin 81 MG chewable tablet, Chew 81 mg by mouth daily., Disp: , Rfl:    buPROPion (WELLBUTRIN XL) 150 MG 24 hr tablet, Take 1 tablet (150 mg total) by mouth daily., Disp: 90 tablet, Rfl: 1   Calcium Carbonate-Vitamin D (CALTRATE 600+D PO), Take 1 tablet by mouth daily at 12 noon., Disp: , Rfl:    cloNIDine (CATAPRES) 0.1 MG tablet, Take 0.1 mg by mouth 3 (three) times daily., Disp: , Rfl:    Cranberry 400 MG TABS, Take 1 tablet by mouth daily., Disp: , Rfl:    diltiazem (CARDIZEM CD) 240 MG 24 hr capsule, TAKE 1 CAPSULE BY MOUTH DAILY, Disp: 90 capsule, Rfl: 0   donepezil (ARICEPT) 10 MG tablet, Take 10 mg by mouth at bedtime., Disp: , Rfl:    DULoxetine (CYMBALTA) 30 MG capsule, Take 1 capsule (30 mg total) by mouth 2 (two) times daily., Disp: 30 capsule, Rfl: 0   ferrous sulfate 325 (65 FE) MG tablet, Take 325 mg by mouth daily with breakfast., Disp: , Rfl:    gabapentin (NEURONTIN) 400 MG capsule, Take 400 mg by mouth 2 (two) times daily., Disp: , Rfl:    Lactobacillus Rhamnosus, GG, (CULTURELLE PO), Take 1 capsule by mouth daily., Disp: ,  Rfl:    memantine (NAMENDA) 10 MG tablet, TAKE 1 TABLET BY MOUTH TWICE DAILY, Disp: 180 tablet, Rfl: 0   Multiple Vitamin (MULTIVITAMIN) tablet, Take 1 tablet by mouth daily., Disp: , Rfl:    tamsulosin (FLOMAX) 0.4 MG CAPS capsule, Take 0.4 mg by mouth at bedtime., Disp: , Rfl:    traZODone (DESYREL) 50 MG tablet, Take 0.5 tablets (25 mg total) by mouth at  bedtime as needed for sleep., Disp: 15 tablet, Rfl: 0  Cognitive Assessment Identity Confirmed: : Name; DOB Cognitive Status: Abnormal (patient with significant functional decline/dementia) Other:  : HIPPA verified on behalf of her husband [name] who is also DPR   Functional Assessment Hearing Difficulty or Deaf: no Wear Glasses or Blind: no Concentrating, Remembering or Making Decisions Difficulty (CP): yes Concentration Management: the patient is bedridden and not communicating much with her husband Difficulty Communicating: yes Communication: unable to understand Communication Management: the patients husband states that she sometimes says things but he cannot understand her most of the time Difficulty Eating/Swallowing: yes Eating/Swallowing Management: the patients husband states she is not eating and drinking much at all, taking some medications Walking or Climbing Stairs Difficulty: yes Mobility Management: the patient is bedbound, is unable to get up and ambulate Dressing/Bathing Difficulty: yes Dressing/Bathing: dressing difficulty, dependent Dressing/Bathing Management: unable to bath or dress self Doing Errands Independently Difficulty (such as shopping) (CP): yes Errands Management: her husband has some one come in 18 hours a week and is seeking more hours   Caregiver Assessment  Primary Source of Support/Comfort: spouse Name of Support/Comfort Primary Source: Smitty Cords- spouse People in Home: spouse Family Caregiver if Needed: spouse Family Caregiver Names: [name] her husband of 60 years is the patients primary caregiver Primary Roles/Responsibilities: retired Concerns About Impact on Relationships: her husband is concerned about not being able to take care of her as her health is rapidly declining   Planned Interventions  Evaluation of current treatment plan related to hypertension self management and patient's adherence to plan as established by provider;   Provided  education to patient re: stroke prevention, s/s of heart attack and stroke; Reviewed prescribed diet heart healthy diet- the patients husband states she is not eating well and she is only taking bites if that.  Reviewed medications with patient and discussed importance of compliance;  Discussed plans with patient for ongoing care management follow up and provided patient with direct contact information for care management team; Advised patient to discuss changes in her condition, questions or concerns with provider; Provided education on prescribed diet heart healthy, patient is at risk for high aspiration;  Discussed complications of poorly controlled blood pressure such as heart disease, stroke, circulatory complications, vision complications, kidney impairment, sexual dysfunction;  Screening for signs and symptoms of depression related to chronic disease state;  Assessed social determinant of health barriers;  Evaluation of current treatment plan related to advanced dementia and functional decline in the patient  and patient's adherence to plan as established by provider Advised patient to be expecting a call from [facility] services to be able to come out sooner to make a home visit to assess and evaluate the patient. Spoke to the patients husband and DPR. Provided education to patient re: talking to the pcp about the patients significant decline from last week. Education on talking to [facility] to see if they can come out sooner to evaluate the patient and start services.  Reviewed medications with patient and discussed the patient is not eating well and  only taking some medications.  Collaborated with pcp and LCSW regarding the patient decline in health and well being. After collaboration with pcp the RNCM called [facility] and spoke with Amber to see about getting an earlier appointment for them to come out to the home to assist. The number is [phone]. Social Work referral for consult  on more help in the home. Did let the LCSW know that Hospice services may be working with the patient and would keep the LCSW posted on changes.  Discussed plans with patient for ongoing care management follow up and provided patient with direct contact information for care management team Advised patient to discuss changes in dementia, declining further, new needs or concerns with provider Screening for signs and symptoms of depression related to chronic disease state  Assessed social determinant of health barriers Spoke to [facility] with [facility] who states they can get out sooner to see the patient. Amber called the RNCM back and has been unable to get the patients husband. The direct number provided of [phone]. The RNCM was able to call the patients husband back and connect with him. Provided the number to [facility] and [name] will call them back as soon as he got off of the phone with the [facility]. Education and support given.      Interaction and coordination with outside resources, practitioners, and providers See CCM Referral  Care Plan: Available in MyChart

## 2022-12-14 NOTE — Telephone Encounter (Signed)
Harrell Lark, RN with [facility] LM on triage line stating she is returning call in regards to this pt.   Called Linda back no answer. Left message advising our provider is giving verbal orders for patient to have monthly catheter exchanges at home with home health. Advised to call back for further questions or concerns.

## 2022-12-14 NOTE — Chronic Care Management (AMB) (Signed)
Chronic Care Management   CCM RN Visit Note  12/14/2022 Name: Kristin [name] MRN: [id number] DOB: Jan 11, 1941  Subjective: Kristin [name] is a 82 y.o. year old female who is a primary care patient of Lorre Munroe, NP. The patient was referred to the Chronic Care Management team for assistance with care management needs subsequent to provider initiation of CCM services and plan of care.    Today's Visit:   Spoke to the patients husband, [name]  for initial visit.        Goals Addressed             This Visit's Progress    CCM Expected Outcome:  Monitor, Self-Manage and Reduce Symptoms of: Dementia with decline in functional status/caregiver stress       Current Barriers:  Knowledge Deficits related to resources for help in the home due to the patient with decline in functional status and being bed bound and advanced dementia Care Coordination needs related to support and help in the home in a patient with advanced dementia with functional decline Chronic Disease Management support and education needs related to effective management of patient with dementia and functional decline Lacks caregiver support.   Planned Interventions: Evaluation of current treatment plan related to advanced dementia and functional decline in the patient  and patient's adherence to plan as established by provider Advised patient to be expecting a call from [facility] services to be able to come out sooner to make a home visit to assess and evaluate the patient. Spoke to the patients husband and DPR. Provided education to patient re: talking to the pcp about the patients significant decline from last week. Education on talking to [facility] to see if they can come out sooner to evaluate the patient and start services.  Reviewed medications with patient and discussed the patient is not eating well and only taking some medications.  Collaborated with pcp and LCSW regarding the patient decline in health and  well being. After collaboration with pcp the RNCM called [facility] and spoke with Amber to see about getting an earlier appointment for them to come out to the home to assist. The number is [phone]. Social Work referral for consult on more help in the home. Did let the LCSW know that Hospice services may be working with the patient and would keep the LCSW posted on changes.  Discussed plans with patient for ongoing care management follow up and provided patient with direct contact information for care management team Advised patient to discuss changes in dementia, declining further, new needs or concerns with provider Screening for signs and symptoms of depression related to chronic disease state  Assessed social determinant of health barriers Spoke to [facility] with [facility] who states they can get out sooner to see the patient. Amber called the RNCM back and has been unable to get the patients husband. The direct number provided of [phone]. The RNCM was able to call the patients husband back and connect with him. Provided the number to [facility] and [name] will call them back as soon as he got off of the phone with the [facility]. Education and support given.   Symptom Management: Call provider office for new concerns or questions   Follow Up Plan: Telephone follow up appointment with care management team member scheduled for: 12-18-2022 at 345 pm       CCM Expected Outcome:  Monitor, Self-Manage, and Reduce Symptoms of Hypertension       Current Barriers:  Chronic Disease  Management support and education needs related to effective management of HTN Lacks caregiver support.   Planned Interventions: Evaluation of current treatment plan related to hypertension self management and patient's adherence to plan as established by provider;   Provided education to patient re: stroke prevention, s/s of heart attack and stroke; Reviewed prescribed diet heart healthy diet- the patients husband  states she is not eating well and she is only taking bites if that.  Reviewed medications with patient and discussed importance of compliance;  Discussed plans with patient for ongoing care management follow up and provided patient with direct contact information for care management team; Advised patient to discuss changes in her condition, questions or concerns with provider; Provided education on prescribed diet heart healthy, patient is at risk for high aspiration;  Discussed complications of poorly controlled blood pressure such as heart disease, stroke, circulatory complications, vision complications, kidney impairment, sexual dysfunction;  Screening for signs and symptoms of depression related to chronic disease state;  Assessed social determinant of health barriers;   Symptom Management: Take medications as prescribed   Attend all scheduled provider appointments Call provider office for new concerns or questions  call the Suicide and Crisis Lifeline: 988 call the [place] National Suicide Prevention Lifeline: 506-100-1989 or TTY: 737-297-9214 TTY 613 783 1714) to talk to a trained counselor call 1-800-273-TALK (toll free, 24 hour hotline) if experiencing a Mental Health or Behavioral Health Crisis  report new symptoms to your doctor  Follow Up Plan: Telephone follow up appointment with care management team member scheduled for: 12-18-2022 at 345 pm          Plan:Telephone follow up appointment with care management team member scheduled for:  12-14-2022 at 345 pm  Alto Denver RN, MSN, CCM RN Care Manager  Chronic Care Management Direct Number: [phone]

## 2022-12-15 ENCOUNTER — Telehealth: Payer: Self-pay | Admitting: Internal Medicine

## 2022-12-15 ENCOUNTER — Ambulatory Visit: Payer: PPO | Admitting: Physician Assistant

## 2022-12-15 NOTE — Telephone Encounter (Signed)
Christie calling from [employer] received order yesterday - is calling to request if Rene Kocher would be Attending for the patient. Please advise CB- [phone]

## 2022-12-15 NOTE — Telephone Encounter (Signed)
Yes

## 2022-12-15 NOTE — Telephone Encounter (Signed)
Lorene Dy advised.   Thanks,   -Vernona Rieger

## 2022-12-16 DIAGNOSIS — I10 Essential (primary) hypertension: Secondary | ICD-10-CM | POA: Diagnosis not present

## 2022-12-16 DIAGNOSIS — F0390 Unspecified dementia without behavioral disturbance: Secondary | ICD-10-CM | POA: Diagnosis not present

## 2022-12-18 ENCOUNTER — Telehealth: Payer: PPO

## 2022-12-18 ENCOUNTER — Ambulatory Visit: Payer: Self-pay

## 2022-12-18 ENCOUNTER — Ambulatory Visit: Payer: PPO | Admitting: Podiatry

## 2022-12-18 ENCOUNTER — Other Ambulatory Visit: Payer: Self-pay

## 2022-12-18 NOTE — Patient Instructions (Signed)
Please call the care guide team at [phone] if you need to cancel or reschedule your appointment.   If you are experiencing a Mental Health or Behavioral Health Crisis or need someone to talk to, please  grief support for the patients husband    Following is a copy of the CCM Program Consent:  CCM service includes personalized support from designated clinical staff supervised by the physician, including individualized plan of care and coordination with other care providers 24/7 contact phone numbers for assistance for urgent and routine care needs. Service will only be billed when office clinical staff spend 20 minutes or more in a month to coordinate care. Only one practitioner may furnish and bill the service in a calendar month. The patient may stop CCM services at amy time (effective at the end of the month) by phone call to the office staff. The patient will be responsible for cost sharing (co-pay) or up to 20% of the service fee (after annual deductible is met)  Following is a copy of your full provider care plan:   Goals Addressed             This Visit's Progress    COMPLETED: CCM Expected Outcome:  Monitor, Self-Manage and Reduce Symptoms of: Dementia with decline in functional status/caregiver stress       Current Barriers: The patient expired on 2022-12-17 Knowledge Deficits related to resources for help in the home due to the patient with decline in functional status and being bed bound and advanced dementia Care Coordination needs related to support and help in the home in a patient with advanced dementia with functional decline Chronic Disease Management support and education needs related to effective management of patient with dementia and functional decline Lacks caregiver support.   Planned Interventions: Evaluation of current treatment plan related to advanced dementia and functional decline in the patient  and patient's adherence to plan as established by  provider Advised patient to be expecting a call from [facility] services to be able to come out sooner to make a home visit to assess and evaluate the patient. Spoke to the patients husband and DPR. Provided education to patient re: talking to the pcp about the patients significant decline from last week. Education on talking to [facility] to see if they can come out sooner to evaluate the patient and start services.  Reviewed medications with patient and discussed the patient is not eating well and only taking some medications.  Collaborated with pcp and LCSW regarding the patient decline in health and well being. After collaboration with pcp the RNCM called [facility] and spoke with Amber to see about getting an earlier appointment for them to come out to the home to assist. The number is [phone]. Social Work referral for consult on more help in the home. Did let the LCSW know that Hospice services may be working with the patient and would keep the LCSW posted on changes.  Discussed plans with patient for ongoing care management follow up and provided patient with direct contact information for care management team Advised patient to discuss changes in dementia, declining further, new needs or concerns with provider Screening for signs and symptoms of depression related to chronic disease state  Assessed social determinant of health barriers Spoke to [facility] with [facility] who states they can get out sooner to see the patient. Amber called the RNCM back and has been unable to get the patients husband. The direct number provided of [phone]. The RNCM was able to  call the patients husband back and connect with him. Provided the number to [facility] and [name] will call them back as soon as he got off of the phone with the [facility]. Education and support given.   Symptom Management: Call provider office for new concerns or questions   Follow Up Plan: Telephone follow up appointment with care  management team member scheduled for: 12-18-2022 at 345 pm       COMPLETED: CCM Expected Outcome:  Monitor, Self-Manage, and Reduce Symptoms of Hypertension       Current Barriers: The patient expired on 2023/01/09 Chronic Disease Management support and education needs related to effective management of HTN Lacks caregiver support.   Planned Interventions: Evaluation of current treatment plan related to hypertension self management and patient's adherence to plan as established by provider;   Provided education to patient re: stroke prevention, s/s of heart attack and stroke; Reviewed prescribed diet heart healthy diet- the patients husband states she is not eating well and she is only taking bites if that.  Reviewed medications with patient and discussed importance of compliance;  Discussed plans with patient for ongoing care management follow up and provided patient with direct contact information for care management team; Advised patient to discuss changes in her condition, questions or concerns with provider; Provided education on prescribed diet heart healthy, patient is at risk for high aspiration;  Discussed complications of poorly controlled blood pressure such as heart disease, stroke, circulatory complications, vision complications, kidney impairment, sexual dysfunction;  Screening for signs and symptoms of depression related to chronic disease state;  Assessed social determinant of health barriers;   Symptom Management: Take medications as prescribed   Attend all scheduled provider appointments Call provider office for new concerns or questions  call the Suicide and Crisis Lifeline: 988 call the [place] National Suicide Prevention Lifeline: (639) 633-4269 or TTY: (406)844-6470 TTY (509)683-3916) to talk to a trained counselor call 1-800-273-TALK (toll free, 24 hour hotline) if experiencing a Mental Health or Behavioral Health Crisis  report new symptoms to your doctor  Follow Up Plan:  Telephone follow up appointment with care management team member scheduled for: 12-18-2022 at 345 pm          Patient verbalizes understanding of instructions and care plan provided today and agrees to view in MyChart. Active MyChart status and patient understanding of how to access instructions and care plan via MyChart confirmed with patient.  No further follow up required: the patient expired on 2023-01-09

## 2022-12-18 NOTE — Chronic Care Management (AMB) (Signed)
Chronic Care Management   CCM RN Visit Note  12/18/2022 Name: Kristin [name] MRN: [id number] DOB: August 13, 1941  Subjective: Kristin [name] is a 82 y.o. year old female who is a primary care patient of Lorre Munroe, NP. The patient was referred to the Chronic Care Management team for assistance with care management needs subsequent to provider initiation of CCM services and plan of care.    Today's Visit:   call to the patients husband Kristin [name] who is also DRP  for  follow up and case closure. The patient per the patients husband expired on Saturday morning: 2023/01/06. Staff notified of patients passing .        Goals Addressed             This Visit's Progress    COMPLETED: CCM Expected Outcome:  Monitor, Self-Manage and Reduce Symptoms of: Dementia with decline in functional status/caregiver stress       Current Barriers: The patient expired on 2023-01-06 Knowledge Deficits related to resources for help in the home due to the patient with decline in functional status and being bed bound and advanced dementia Care Coordination needs related to support and help in the home in a patient with advanced dementia with functional decline Chronic Disease Management support and education needs related to effective management of patient with dementia and functional decline Lacks caregiver support.   Planned Interventions: Evaluation of current treatment plan related to advanced dementia and functional decline in the patient  and patient's adherence to plan as established by provider Advised patient to be expecting a call from [facility] services to be able to come out sooner to make a home visit to assess and evaluate the patient. Spoke to the patients husband and DPR. Provided education to patient re: talking to the pcp about the patients significant decline from last week. Education on talking to [facility] to see if they can come out sooner to evaluate the patient and start services.  Reviewed  medications with patient and discussed the patient is not eating well and only taking some medications.  Collaborated with pcp and LCSW regarding the patient decline in health and well being. After collaboration with pcp the RNCM called [facility] and spoke with Amber to see about getting an earlier appointment for them to come out to the home to assist. The number is [phone]. Social Work referral for consult on more help in the home. Did let the LCSW know that Hospice services may be working with the patient and would keep the LCSW posted on changes.  Discussed plans with patient for ongoing care management follow up and provided patient with direct contact information for care management team Advised patient to discuss changes in dementia, declining further, new needs or concerns with provider Screening for signs and symptoms of depression related to chronic disease state  Assessed social determinant of health barriers Spoke to [facility] with [facility] who states they can get out sooner to see the patient. Amber called the RNCM back and has been unable to get the patients husband. The direct number provided of [phone]. The RNCM was able to call the patients husband back and connect with him. Provided the number to [facility] and Kristin [name] will call them back as soon as he got off of the phone with the [facility]. Education and support given.   Symptom Management: Call provider office for new concerns or questions   Follow Up Plan: Telephone follow up appointment with care management team member scheduled for: 12-18-2022 at  345 pm       COMPLETED: CCM Expected Outcome:  Monitor, Self-Manage, and Reduce Symptoms of Hypertension       Current Barriers: The patient expired on 2022-12-30 Chronic Disease Management support and education needs related to effective management of HTN Lacks caregiver support.   Planned Interventions: Evaluation of current treatment plan related to hypertension self  management and patient's adherence to plan as established by provider;   Provided education to patient re: stroke prevention, s/s of heart attack and stroke; Reviewed prescribed diet heart healthy diet- the patients husband states she is not eating well and she is only taking bites if that.  Reviewed medications with patient and discussed importance of compliance;  Discussed plans with patient for ongoing care management follow up and provided patient with direct contact information for care management team; Advised patient to discuss changes in her condition, questions or concerns with provider; Provided education on prescribed diet heart healthy, patient is at risk for high aspiration;  Discussed complications of poorly controlled blood pressure such as heart disease, stroke, circulatory complications, vision complications, kidney impairment, sexual dysfunction;  Screening for signs and symptoms of depression related to chronic disease state;  Assessed social determinant of health barriers;   Symptom Management: Take medications as prescribed   Attend all scheduled provider appointments Call provider office for new concerns or questions  call the Suicide and Crisis Lifeline: 988 call the [place] National Suicide Prevention Lifeline: 810 369 5300 or TTY: (416)382-2691 TTY 986-340-0727) to talk to a trained counselor call 1-800-273-TALK (toll free, 24 hour hotline) if experiencing a Mental Health or Behavioral Health Crisis  report new symptoms to your doctor  Follow Up Plan: Telephone follow up appointment with care management team member scheduled for: 12-18-2022 at 345 pm          Plan:No further follow up required: the patient has expired  Alto Denver RN, MSN, CCM RN Care Manager  Chronic Care Management Direct Number: [phone]

## 2022-12-22 ENCOUNTER — Encounter: Payer: PPO | Admitting: *Deleted

## 2022-12-27 ENCOUNTER — Ambulatory Visit: Payer: PPO | Admitting: Physician Assistant

## 2023-01-13 DEATH — deceased

## 2023-01-22 ENCOUNTER — Encounter: Payer: PPO | Admitting: Internal Medicine
